# Patient Record
Sex: Female | Born: 1935 | Race: White | Hispanic: No | Marital: Married | Smoking: Former smoker
Health system: Southern US, Community
[De-identification: ages and names within clinical notes are randomized; demographics above are authoritative.]

## PROBLEM LIST (undated history)

## (undated) DIAGNOSIS — I639 Cerebral infarction, unspecified: Secondary | ICD-10-CM

## (undated) DIAGNOSIS — Z923 Personal history of irradiation: Secondary | ICD-10-CM

## (undated) DIAGNOSIS — H18899 Other specified disorders of cornea, unspecified eye: Secondary | ICD-10-CM

## (undated) DIAGNOSIS — H5462 Unqualified visual loss, left eye, normal vision right eye: Secondary | ICD-10-CM

## (undated) DIAGNOSIS — J189 Pneumonia, unspecified organism: Secondary | ICD-10-CM

## (undated) DIAGNOSIS — I10 Essential (primary) hypertension: Secondary | ICD-10-CM

## (undated) DIAGNOSIS — C519 Malignant neoplasm of vulva, unspecified: Secondary | ICD-10-CM

## (undated) DIAGNOSIS — R0602 Shortness of breath: Secondary | ICD-10-CM

## (undated) DIAGNOSIS — N183 Chronic kidney disease, stage 3 unspecified: Secondary | ICD-10-CM

## (undated) DIAGNOSIS — K279 Peptic ulcer, site unspecified, unspecified as acute or chronic, without hemorrhage or perforation: Secondary | ICD-10-CM

## (undated) DIAGNOSIS — E1129 Type 2 diabetes mellitus with other diabetic kidney complication: Secondary | ICD-10-CM

## (undated) DIAGNOSIS — T8149XA Infection following a procedure, other surgical site, initial encounter: Secondary | ICD-10-CM

## (undated) DIAGNOSIS — I4891 Unspecified atrial fibrillation: Secondary | ICD-10-CM

## (undated) DIAGNOSIS — E1165 Type 2 diabetes mellitus with hyperglycemia: Secondary | ICD-10-CM

## (undated) DIAGNOSIS — C801 Malignant (primary) neoplasm, unspecified: Secondary | ICD-10-CM

## (undated) DIAGNOSIS — E669 Obesity, unspecified: Secondary | ICD-10-CM

## (undated) DIAGNOSIS — R499 Unspecified voice and resonance disorder: Secondary | ICD-10-CM

## (undated) DIAGNOSIS — E039 Hypothyroidism, unspecified: Secondary | ICD-10-CM

## (undated) DIAGNOSIS — H3530 Unspecified macular degeneration: Secondary | ICD-10-CM

## (undated) DIAGNOSIS — E785 Hyperlipidemia, unspecified: Secondary | ICD-10-CM

## (undated) DIAGNOSIS — D649 Anemia, unspecified: Secondary | ICD-10-CM

## (undated) DIAGNOSIS — K922 Gastrointestinal hemorrhage, unspecified: Secondary | ICD-10-CM

## (undated) HISTORY — DX: Obesity, unspecified: E66.9

## (undated) HISTORY — PX: CYSTOURETHROPLASTY / URETERONEOCYSTOSTOMY: SUR383

## (undated) HISTORY — DX: Hyperlipidemia, unspecified: E78.5

## (undated) HISTORY — PX: RADICAL VULVECTOMY: SHX2286

## (undated) HISTORY — PX: OTHER SURGICAL HISTORY: SHX169

## (undated) HISTORY — DX: Anemia, unspecified: D64.9

## (undated) HISTORY — DX: Personal history of irradiation: Z92.3

## (undated) HISTORY — DX: Hypothyroidism, unspecified: E03.9

## (undated) HISTORY — DX: Malignant neoplasm of vulva, unspecified: C51.9

## (undated) HISTORY — PX: DILATION AND CURETTAGE OF UTERUS: SHX78

## (undated) HISTORY — DX: Cerebral infarction, unspecified: I63.9

## (undated) HISTORY — DX: Essential (primary) hypertension: I10

## (undated) HISTORY — PX: BREAST SURGERY: SHX581

---

## 1998-05-20 ENCOUNTER — Other Ambulatory Visit: Admission: RE | Admit: 1998-05-20 | Discharge: 1998-05-20 | Payer: Self-pay | Admitting: Endocrinology

## 1999-04-26 ENCOUNTER — Other Ambulatory Visit: Admission: RE | Admit: 1999-04-26 | Discharge: 1999-04-26 | Payer: Self-pay | Admitting: Obstetrics and Gynecology

## 1999-10-21 ENCOUNTER — Emergency Department (HOSPITAL_COMMUNITY): Admission: EM | Admit: 1999-10-21 | Discharge: 1999-10-22 | Payer: Self-pay | Admitting: Emergency Medicine

## 1999-11-01 ENCOUNTER — Ambulatory Visit (HOSPITAL_COMMUNITY): Admission: RE | Admit: 1999-11-01 | Discharge: 1999-11-01 | Payer: Self-pay | Admitting: Endocrinology

## 1999-11-01 ENCOUNTER — Encounter: Payer: Self-pay | Admitting: Endocrinology

## 1999-11-14 ENCOUNTER — Ambulatory Visit (HOSPITAL_COMMUNITY): Admission: RE | Admit: 1999-11-14 | Discharge: 1999-11-14 | Payer: Self-pay | Admitting: Endocrinology

## 1999-11-14 ENCOUNTER — Encounter: Payer: Self-pay | Admitting: Endocrinology

## 2000-05-25 DIAGNOSIS — I639 Cerebral infarction, unspecified: Secondary | ICD-10-CM

## 2000-05-25 HISTORY — DX: Cerebral infarction, unspecified: I63.9

## 2000-06-20 ENCOUNTER — Ambulatory Visit (HOSPITAL_COMMUNITY): Admission: RE | Admit: 2000-06-20 | Discharge: 2000-06-20 | Payer: Self-pay | Admitting: Endocrinology

## 2000-06-20 ENCOUNTER — Encounter: Payer: Self-pay | Admitting: Endocrinology

## 2000-08-08 ENCOUNTER — Encounter: Payer: Self-pay | Admitting: Vascular Surgery

## 2000-08-09 ENCOUNTER — Inpatient Hospital Stay: Admission: RE | Admit: 2000-08-09 | Discharge: 2000-08-10 | Payer: Self-pay | Admitting: Vascular Surgery

## 2001-03-07 ENCOUNTER — Emergency Department (HOSPITAL_COMMUNITY): Admission: EM | Admit: 2001-03-07 | Discharge: 2001-03-08 | Payer: Self-pay | Admitting: Emergency Medicine

## 2001-03-08 ENCOUNTER — Encounter: Payer: Self-pay | Admitting: Emergency Medicine

## 2003-06-16 ENCOUNTER — Encounter: Admission: RE | Admit: 2003-06-16 | Discharge: 2003-06-16 | Payer: Self-pay | Admitting: Endocrinology

## 2003-06-16 ENCOUNTER — Encounter: Payer: Self-pay | Admitting: Endocrinology

## 2004-03-25 ENCOUNTER — Emergency Department (HOSPITAL_COMMUNITY): Admission: EM | Admit: 2004-03-25 | Discharge: 2004-03-25 | Payer: Self-pay | Admitting: Emergency Medicine

## 2004-04-16 ENCOUNTER — Emergency Department (HOSPITAL_COMMUNITY): Admission: EM | Admit: 2004-04-16 | Discharge: 2004-04-16 | Payer: Self-pay | Admitting: Emergency Medicine

## 2005-12-25 DIAGNOSIS — C801 Malignant (primary) neoplasm, unspecified: Secondary | ICD-10-CM

## 2005-12-25 HISTORY — DX: Malignant (primary) neoplasm, unspecified: C80.1

## 2005-12-25 HISTORY — PX: LYMPHADENECTOMY: SHX15

## 2006-06-01 ENCOUNTER — Other Ambulatory Visit: Admission: RE | Admit: 2006-06-01 | Discharge: 2006-06-01 | Payer: Self-pay | Admitting: Obstetrics and Gynecology

## 2006-08-06 ENCOUNTER — Encounter: Admission: RE | Admit: 2006-08-06 | Discharge: 2006-08-06 | Payer: Self-pay | Admitting: Obstetrics and Gynecology

## 2006-08-07 ENCOUNTER — Encounter (INDEPENDENT_AMBULATORY_CARE_PROVIDER_SITE_OTHER): Payer: Self-pay | Admitting: *Deleted

## 2006-08-07 ENCOUNTER — Ambulatory Visit (HOSPITAL_BASED_OUTPATIENT_CLINIC_OR_DEPARTMENT_OTHER): Admission: RE | Admit: 2006-08-07 | Discharge: 2006-08-07 | Payer: Self-pay | Admitting: Obstetrics and Gynecology

## 2006-08-14 ENCOUNTER — Ambulatory Visit: Admission: RE | Admit: 2006-08-14 | Discharge: 2006-08-14 | Payer: Self-pay | Admitting: Gynecology

## 2006-09-18 ENCOUNTER — Encounter (INDEPENDENT_AMBULATORY_CARE_PROVIDER_SITE_OTHER): Payer: Self-pay | Admitting: *Deleted

## 2006-09-18 ENCOUNTER — Ambulatory Visit (HOSPITAL_COMMUNITY): Admission: RE | Admit: 2006-09-18 | Discharge: 2006-09-19 | Payer: Self-pay | Admitting: Gynecology

## 2006-10-16 ENCOUNTER — Ambulatory Visit: Admission: RE | Admit: 2006-10-16 | Discharge: 2006-10-16 | Payer: Self-pay | Admitting: Gynecology

## 2006-11-13 ENCOUNTER — Encounter (INDEPENDENT_AMBULATORY_CARE_PROVIDER_SITE_OTHER): Payer: Self-pay | Admitting: *Deleted

## 2006-11-13 ENCOUNTER — Ambulatory Visit (HOSPITAL_COMMUNITY): Admission: RE | Admit: 2006-11-13 | Discharge: 2006-11-13 | Payer: Self-pay | Admitting: Gynecology

## 2007-02-22 ENCOUNTER — Ambulatory Visit: Admission: RE | Admit: 2007-02-22 | Discharge: 2007-02-22 | Payer: Self-pay | Admitting: Gynecology

## 2007-05-14 ENCOUNTER — Ambulatory Visit: Admission: RE | Admit: 2007-05-14 | Discharge: 2007-05-14 | Payer: Self-pay | Admitting: Gynecology

## 2007-06-18 ENCOUNTER — Ambulatory Visit: Payer: Self-pay | Admitting: Vascular Surgery

## 2007-11-05 ENCOUNTER — Ambulatory Visit: Admission: RE | Admit: 2007-11-05 | Discharge: 2007-11-05 | Payer: Self-pay | Admitting: Gynecology

## 2007-12-10 ENCOUNTER — Ambulatory Visit: Admission: RE | Admit: 2007-12-10 | Discharge: 2007-12-10 | Payer: Self-pay | Admitting: Gynecology

## 2008-01-07 ENCOUNTER — Encounter: Admission: RE | Admit: 2008-01-07 | Discharge: 2008-01-07 | Payer: Self-pay | Admitting: Endocrinology

## 2008-01-13 ENCOUNTER — Encounter: Admission: RE | Admit: 2008-01-13 | Discharge: 2008-01-13 | Payer: Self-pay | Admitting: Endocrinology

## 2008-02-21 ENCOUNTER — Ambulatory Visit: Admission: RE | Admit: 2008-02-21 | Discharge: 2008-02-21 | Payer: Self-pay | Admitting: Gynecology

## 2008-05-15 ENCOUNTER — Ambulatory Visit: Admission: RE | Admit: 2008-05-15 | Discharge: 2008-05-15 | Payer: Self-pay | Admitting: Gynecology

## 2008-06-16 ENCOUNTER — Ambulatory Visit: Payer: Self-pay | Admitting: Vascular Surgery

## 2008-09-09 ENCOUNTER — Encounter: Payer: Self-pay | Admitting: Gynecology

## 2008-09-09 ENCOUNTER — Ambulatory Visit: Admission: RE | Admit: 2008-09-09 | Discharge: 2008-09-09 | Payer: Self-pay | Admitting: Gynecology

## 2008-10-13 ENCOUNTER — Ambulatory Visit (HOSPITAL_COMMUNITY): Admission: RE | Admit: 2008-10-13 | Discharge: 2008-10-14 | Payer: Self-pay | Admitting: Gynecology

## 2008-10-13 ENCOUNTER — Encounter: Payer: Self-pay | Admitting: Gynecology

## 2008-10-23 ENCOUNTER — Ambulatory Visit: Admission: RE | Admit: 2008-10-23 | Discharge: 2008-10-23 | Payer: Self-pay | Admitting: Gynecology

## 2008-11-06 ENCOUNTER — Ambulatory Visit: Admission: RE | Admit: 2008-11-06 | Discharge: 2008-11-06 | Payer: Self-pay | Admitting: Gynecology

## 2008-11-27 ENCOUNTER — Ambulatory Visit: Admission: RE | Admit: 2008-11-27 | Discharge: 2008-11-27 | Payer: Self-pay | Admitting: Gynecology

## 2009-02-10 ENCOUNTER — Ambulatory Visit: Admission: RE | Admit: 2009-02-10 | Discharge: 2009-02-10 | Payer: Self-pay | Admitting: Gynecology

## 2009-02-20 ENCOUNTER — Emergency Department (HOSPITAL_COMMUNITY): Admission: EM | Admit: 2009-02-20 | Discharge: 2009-02-20 | Payer: Self-pay | Admitting: Emergency Medicine

## 2009-05-21 ENCOUNTER — Ambulatory Visit: Admission: RE | Admit: 2009-05-21 | Discharge: 2009-05-21 | Payer: Self-pay | Admitting: Gynecology

## 2009-08-11 ENCOUNTER — Ambulatory Visit: Admission: RE | Admit: 2009-08-11 | Discharge: 2009-08-11 | Payer: Self-pay | Admitting: Gynecology

## 2009-11-17 ENCOUNTER — Ambulatory Visit: Admission: RE | Admit: 2009-11-17 | Discharge: 2009-11-17 | Payer: Self-pay | Admitting: Gynecology

## 2010-03-18 ENCOUNTER — Ambulatory Visit: Admission: RE | Admit: 2010-03-18 | Discharge: 2010-03-18 | Payer: Self-pay | Admitting: Gynecology

## 2010-07-22 ENCOUNTER — Ambulatory Visit: Admission: RE | Admit: 2010-07-22 | Discharge: 2010-07-22 | Payer: Self-pay | Admitting: Gynecology

## 2010-09-07 ENCOUNTER — Encounter: Admission: RE | Admit: 2010-09-07 | Discharge: 2010-09-07 | Payer: Self-pay | Admitting: Endocrinology

## 2010-12-25 HISTORY — PX: VULVAR LESION REMOVAL: SHX5391

## 2011-01-06 ENCOUNTER — Ambulatory Visit
Admission: RE | Admit: 2011-01-06 | Discharge: 2011-01-06 | Payer: Self-pay | Source: Home / Self Care | Attending: Gynecology | Admitting: Gynecology

## 2011-01-25 ENCOUNTER — Ambulatory Visit: Payer: Medicare Other | Attending: Gynecology | Admitting: Gynecology

## 2011-01-25 DIAGNOSIS — C519 Malignant neoplasm of vulva, unspecified: Secondary | ICD-10-CM | POA: Insufficient documentation

## 2011-01-25 DIAGNOSIS — E785 Hyperlipidemia, unspecified: Secondary | ICD-10-CM | POA: Insufficient documentation

## 2011-01-25 DIAGNOSIS — E119 Type 2 diabetes mellitus without complications: Secondary | ICD-10-CM | POA: Insufficient documentation

## 2011-01-25 DIAGNOSIS — Z09 Encounter for follow-up examination after completed treatment for conditions other than malignant neoplasm: Secondary | ICD-10-CM | POA: Insufficient documentation

## 2011-01-25 DIAGNOSIS — E039 Hypothyroidism, unspecified: Secondary | ICD-10-CM | POA: Insufficient documentation

## 2011-01-25 DIAGNOSIS — I10 Essential (primary) hypertension: Secondary | ICD-10-CM | POA: Insufficient documentation

## 2011-01-26 ENCOUNTER — Telehealth: Payer: Self-pay | Admitting: *Deleted

## 2011-01-26 NOTE — Consult Note (Signed)
  NAMECAYLA, [name] NO.:  [id number]  MEDICAL RECORD NO.:  [id number]          PATIENT TYPE:  OUT  LOCATION:  GYN                          FACILITY:  [facility]  PHYSICIAN:  De Blanch, M.D.DATE OF BIRTH:  05-10-1926  DATE OF CONSULTATION:  01/25/2011 DATE OF DISCHARGE:                                CONSULTATION   CHIEF COMPLAINT:  Vulvar cancer.  INTERVAL HISTORY:  The patient returns today for scheduled followup. Since her last visit, she has done well.  She denies any GI or GU symptoms; has no pelvic pain, pressure, vaginal bleeding or discharge. She has rare episodes of pruritus of the vulva which she uses Temovate for.  She is voiding well without any problems.  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  She underwent a modified radical right vulvectomy and right inguinal lymphadenectomy.  Lymph nodes and surgical margins were negative.  In October 2008, she developed recurrence on the contralateral vulva and underwent a left modified radical vulvectomy.  This lesion was 2.5 cm in diameter and was just lateral to urethra.  Given her age and comorbidities, we did not do an inguinal lymphadenectomy at that time. She developed yet another recurrence in October 2009 on the left vulva and after excision had reconstruction using rhomboid flaps.  Surgical margins were again negative.  We have followed the patient since October 2009 with no evidence of recurrent disease.  PAST MEDICAL HISTORY/MEDICAL ILLNESSES:  Diabetes, hypertension, hyperlipidemia, hypothyroidism.  PAST SURGICAL HISTORY:  Breast lumpectomy, cystourethropexy, D and C. Modified radical vulvectomy in 2007, 2008 and 2009.  Right inguinal femoral lymphadenectomy in 2007.  CURRENT MEDICATIONS:  Lipitor, Lotrel, Synthroid, Actos, NovoLog b.i.d., and baby aspirin.  DRUG ALLERGIES:   None.  FAMILY HISTORY:  Negative for gynecologic, breast, or colon cancer.  SOCIAL HISTORY:  The patient is married.  Her husband is Paediatric nurse, working 3 days a week.  He recently had an inguinal hernia repair.  She does not smoke.  REVIEW OF SYSTEMS:  Ten-point comprehensive review of systems negative except as noted above.  PHYSICAL EXAMINATION:  VITAL SIGNS:  Weight 156 pounds, blood pressure 124/56. GENERAL:  In general, the patient is a healthy, elderly white female, in no acute distress. HEENT:  Negative. NECK:  Supple without thyromegaly.  There is no supraclavicular or inguinal adenopathy. The vulvar exam shows reconstruction with rhomboid flaps causing some deformity anteriorly.  Otherwise, however, the vulvar skin appears healthy.  No lesions are noted.  The urethra is visualized.  IMPRESSION AND RECOMMENDATIONS:  Vulvar carcinoma with no evidence of recurrent disease.  The patient will return to see me in 6 months for continuing followup.     De Blanch, M.D.  cc Telford Nab, RN     DC/MEDQ  D:  01/25/2011  T:  01/25/2011  Job:  [id number]   Electronically Signed by De Blanch M.D. on 01/26/2011 01:22:42 PM

## 2011-04-11 LAB — GLUCOSE, CAPILLARY

## 2011-05-09 NOTE — Consult Note (Signed)
NAMEQUINTESSA, SIMMERMAN NO.:  [id number]   MEDICAL RECORD NO.:  [id number]          PATIENT TYPE:  OUT   LOCATION:  GYN                          FACILITY:  [facility]   PHYSICIAN:  De Blanch, M.D.DATE OF BIRTH:  November 13, 1926   DATE OF CONSULTATION:  08/11/2009  DATE OF DISCHARGE:                                 CONSULTATION   CHIEF COMPLAINT:  Vulvar cancer.   Perianal pruritus.   INTERVAL HISTORY:  The patient returns today for continuing followup of  her vulvar cancer.  Since her last visit, she has done well.  She denies  any vulvar symptoms, although she did have some pruritus around her anus  which is now resolved.  During that time, she had some diarrhea.  Otherwise, her functional status has been good and overall seems to be  doing well.   HISTORY OF PRESENT ILLNESS:  In September 2007, the patient had a right  vulvar carcinoma treated with modified radical vulvectomy and right  inguinal lymphadenectomy.  All nodes were negative.  Margins were  negative.  The patient was followed.   She then developed a recurrence in October 2008 with a lesion on the  left vulva.  She underwent a left modified radical vulvectomy.  The  lesion was 2.5 cm in diameter on the anterior vulva just lateral to the  urethra.  Because of her age and overall comorbidities, an inguinal  lymphadenectomy was not performed.  She had negative surgical margins  but a close margin to the urethra.  In October 2009, she had another  recurrence of the left vulva, and reconstruction was accomplished by a  rhomboid advancement flap.  Surgical margins were again negative.   PAST MEDICAL HISTORY:  Medical illnesses:  Diabetes, hypertension,  hyperlipidemia, hypothyroidism and moderate obesity.   PAST SURGICAL HISTORY:  Breast lumpectomy, cystourethropexy, D and C,  modified radical vulvectomy 2007, modified left radical vulvectomy 2008,  right inguinal femoral lymphadenectomy 2007.   CURRENT MEDICATIONS:  Lipitor, Lotrel, Synthroid, Actos, NovoLog b.i.d.  and baby aspirin.   DRUG ALLERGIES:  None.   FAMILY HISTORY:  Negative for gynecologic, breast, or colon cancer.   SOCIAL HISTORY:  The patient is married.  She is retired.  She comes  accompanied by her husband who recently had aneurysm surgery.  She does  not smoke.   REVIEW OF SYSTEMS:  A 10-point comprehensive review of systems negative  except as noted above.   PHYSICAL EXAMINATION:  VITAL SIGNS:  Weight 150 pounds, blood pressure  128/62.  GENERAL:  The patient is a healthy elderly white female in no acute  distress.  HEENT:  Negative.  NECK:  Supple without thyromegaly.  There is no supraclavicular or  inguinal adenopathy.  ABDOMEN:  Soft and nontender.  No masses, organomegaly, ascites or  hernias are noted.  PELVIC EXAM:  EG/BUS, vagina, bladder urethra show reconstructive  changes.  Careful inspection showed no lesions.  Examination of the anus  shows no lesions, either.  Vagina is otherwise normal.  No lesions are  noted.   IMPRESSION:  History of  vulvar carcinoma with recurrences, currently  without any evidence of disease.  The patient will return in 3 months  for continuing followup.      De Blanch, M.D.  Electronically Signed     DC/MEDQ  D:  08/11/2009  T:  08/11/2009  Job:  [id number]   cc:   Telford Nab, R.N.  [address]  [place], [place] [address]   Dollene Cleveland, M.D.  Fax: [phone]

## 2011-05-09 NOTE — Consult Note (Signed)
NAMEPRAISE, STENNETT NO.:  [id number]   MEDICAL RECORD NO.:  [id number]          PATIENT TYPE:  OUT   LOCATION:  GYN                          FACILITY:  [facility]   PHYSICIAN:  De Blanch, M.D.DATE OF BIRTH:  05-07-1926   DATE OF CONSULTATION:  12/10/2007  DATE OF DISCHARGE:                                 CONSULTATION   [facility]   REFERRING PHYSICIAN:  __________   CHIEF COMPLAINT:  Postoperative followup.   INTERVAL HISTORY:  The patient underwent an anterior radical vulvectomy  at [facility] on November 19, 2007.  Final pathology showed a  moderately differentiated invasive squamous cell carcinoma invading 1  cm.  There was no evidence of lymph vascular space involvement, and all  margins were free of resection although one margin close to the urethra  was within 2 mm.  The patient has had an uncomplicated postoperative  course.   PHYSICAL EXAM:  VITAL SIGNS:  Weight 154 pounds.  GENERAL:  The patient is a healthy white female in no acute distress.  HEENT:  Negative.  NECK:  Supple without thyromegaly.  ABDOMEN:  Soft, nontender.  No mass, organomegaly, ascites, or hernias  noted.  There is no inguinal adenopathy.  PELVIC EXAM:  EG BUS shows that the anterior vulva is healing well.   IMPRESSION:  Recurrent vulvar cancer status post radical resection.   I have reviewed the patient's pathology report with the patient and her  husband, and we discussed the pros-and-cons of undertaking a diagnostic  left lymphadenectomy (the patient previously had a right inguinal  lymphadenectomy).  At this juncture she does not wish to consider this,  and we will monitor her closely.  She will return to see me in 3 months  for continuing followup.      De Blanch, M.D.  Electronically Signed     DC/MEDQ  D:  12/10/2007  T:  12/11/2007  Job:  [id number]   cc:   Telford Nab, R.N.  [address]  [place], [place] [address]   Rande Brunt. Eda Paschal, M.D.  Fax: [phone]   Dollene Cleveland, M.D.  Fax: [phone]

## 2011-05-09 NOTE — Op Note (Signed)
[name], [name] NO.:  [id number]   MEDICAL RECORD NO.:  [id number]          PATIENT TYPE:  OIB   LOCATION:                               FACILITY:  [facility]   PHYSICIAN:  De Blanch, M.D.DATE OF BIRTH:  Jul 22, 1926   DATE OF PROCEDURE:  10/12/2008  DATE OF DISCHARGE:                               OPERATIVE REPORT   PREOPERATIVE DIAGNOSIS:  Recurrent carcinoma of the left vulva.   POSTOPERATIVE DIAGNOSIS:  Recurrent carcinoma of the left vulva.   PROCEDURE:  Modified left radical vulvectomy.  Rhomboid advancement flap  reconstruction.   FIRST ASSISTANT:  Telford Nab, R.N.   ANESTHESIA:  General with LMA.   ESTIMATED BLOOD LOSS:  50 mL.   FINDINGS:  Examination under anesthesia revealed the patient's vulva  which had previously undergone radical vulvectomy.  There were two  lesions, both of which were approximately 2 cm in diameter arising from  the left anterior vulva approaching the midline on the most superior  lesion.  Essentially the entire left vulva was excised along with the  clitoris in order to obtain a surgical margin.  The margin extended  adjacent to the urethral meatus on the left.  There were no palpable  inguinal nodes.   DESCRIPTION OF PROCEDURE:  The patient was brought to the operating room  and after satisfactory attainment of LMA anesthesia was placed in a  modified lithotomy position in [place] stirrups.  The vulva, vagina,  perineum and anterior abdominal wall were prepped with Betadine and the  patient was draped.  The lesions were identified and a 1-1/2 cm margin  was created in an elliptical incision which extended above the clitoris  and down to the lower third of the posterior vulva immediately adjacent  to the urethral meatus and nearly to the labial crural fold laterally.  This was taken down to the pelvic floor fascia.  Hemostasis achieved  with cautery.  The bleeding from the base the clitoris was controlled  with two figure-of-eight sutures of 2-0 Vicryl.  Once the specimen was  excised, it was oriented on cork board.   The hemostasis achieved with cautery and then closure of the vulva was  begun.  The superior aspect reapproximated adequately for approximately  1 inch and the area adjacent to the right side of the urethra  reapproximated for approximately 2 cm as well.  In order to close the  defect two rhomboid flaps were created lateral to the lateral aspect of  the incision.  These were undermined with approximately 1/2 cm of fat  beneath the skin flap.  There were then mobilized medially.  On tip of a  flap approximated the lateral aspect of the urethral meatus from above  and below.  All sutures used were 2-0 Vicryl.  There was an area that  was left open approximately 6 mm in diameter just above the urethra  which would have to granulate in.  The remainder of the vulva closed  adequately with interrupted vertical mattress sutures and interrupted  sutures.  In some areas deep closure of the  subcutaneous area was  created using 2-0 Vicryl.  At the completion of the surgical procedure a  Foley catheter was placed and a ice pack was placed on the vulva.   The patient tolerated the surgery well and was transferred to the  recovery room in satisfactory condition  with the plan of holding her  overnight to make certain she could void the next morning.  Sponge,  needle and sponge counts correct x2.      De Blanch, M.D.  Electronically Signed     DC/MEDQ  D:  10/13/2008  T:  10/14/2008  Job:  [id number]   cc:   Telford Nab, R.N.  [address]  [place], [place] [address]   Rande Brunt. Eda Paschal, M.D.  Fax: [phone]

## 2011-05-09 NOTE — Consult Note (Signed)
NAMEBURGANDY, HACKWORTH NO.:  [id number]   MEDICAL RECORD NO.:  [id number]          PATIENT TYPE:  OUT   LOCATION:  GYN                          [facility]ACILITY:  [facility]   PHYSICIAN:  De Blanch, M.D.DATE OF BIRTH:  November 02, 1926   DATE OF CONSULTATION:  05/14/2007  DATE OF DISCHARGE:                                 CONSULTATION   GYN ONCOLOGY CLINIC NOTE   CHIEF COMPLAINT:  Carcinoma of the vulva.   INTERVAL HISTORY:  Since her last visit, the patient has done well.  She  denies any GI or GU symptoms, has no pelvic pain, pressure, vaginal  bleeding, or discharge.  Her vulvar pruritus is minimal.  She denies any  edema of her leg.   HISTORY OF PRESENT ILLNESS:  The patient underwent a modified radical  vulvectomy of the right vulva in September of 2007.  She had an area of  invasive vulvar carcinoma and subsequently underwent right inguinal  lymphadenectomy in November of 2007.  All lymph nodes were negative for  metastatic disease, and all surgical margins were negative.  The patient  has been followed since that time, and no evidence of recurrent disease.   PAST MEDICAL HISTORY:  Medical illnesses:  1. Diabetes.  2. Hypertension.  3. Hyperlipidemia.  4. Hypothyroidism.   PAST SURGICAL HISTORY:  1. Breast lumpectomy.  2. Cystourethropexy.  3. D&C.  4. Modified radical vulvectomy.  5. Right inguinal femoral lymphadenectomy.   CURRENT MEDICATIONS:  Lipitor, Lotrel, Synthroid, Actos, NovoLog b.i.d.,  and baby aspirin.   DRUG ALLERGIES:  None.   FAMILY HISTORY:  Negative for gynecologic, breast, or colon cancer.   SOCIAL HISTORY:  The patient is married and is retired.  She is a former  smoker.   REVIEW OF SYSTEMS:  A 10-point comprehensive review of systems negative  except as noted above.   PHYSICAL EXAMINATION:  VITAL SIGNS:  Weight 153 pounds, blood pressure  145/65.  GENERAL:  The patient is a healthy white female in no acute distress.  HEENT:  Negative.  NECK:  Supple without thyromegaly.  There is no supraclavicular or  inguinal adenopathy.  ABDOMEN:  Soft and nontender.  No masses, organomegaly, ascites, or  hernias are noted.  Palpation in the inguinal regions bilaterally show no adenopathy.  Her  right inguinal incision is well healed.  She has no lymphedema.  PELVIC EXAM:  EG-BUS is status post vulvectomy.  There are some changes  consistent with lichen sclerosis in the anterior vulva.  The vagina is  otherwise clean and well-supported, no lesions are noted, and the cervix  is atrophic, the uterus is small and anterior, no adnexal masses are  noted.  Rectovaginal exam confirms.   IMPRESSION:  1. Vulvar cancer, September of 2007; no evidence of recurrent disease.  2. Lichen sclerosis with pruritus.  The patient will continue using      Temovate ointment 0.05% b.i.d.  She will return to see me in six      months for continuing followup.      De Blanch, M.D.  Electronically Signed  DC/MEDQ  D:  05/14/2007  T:  05/14/2007  Job:  [id number]   cc:   Telford Nab, R.N.  [address]  [place], [place] [address]   Rande Brunt. Eda Paschal, M.D.  Fax: [phone]   Dollene Cleveland, M.D.  Fax: [phone]

## 2011-05-09 NOTE — Assessment & Plan Note (Signed)
OFFICE VISIT   Lindsay, [name]  DOB:  09-12-1926                                       06/18/2007  ZOXWR#:[id number]   [name] returns for her annual followup regarding her  carotid  occlusive disease. She is status post right carotid endarterectomy in  August of 2001 following a right brain stroke. She has had no new  neurologic symptoms. She continues to have some mild left facial  weakness, her left sided weakness has completely resolved. She has had  no amaurosis fugax or other neurologic symptoms in the interim. She also  denies chest pain, dyspnea on exertion, PND, or orthopnea. She is able  to ambulate about 1 block. She takes 1 aspirin per day.   PHYSICAL EXAMINATION:  Blood pressure 152/77, heart rate is 70,  respirations are 16, and carotid pulses are 3+ with soft bruits on the  left. NEUROLOGIC EXAM: Reveals mild left facial weakness, otherwise  unremarkable. Speech is clear. CHEST: Clear to auscultation. Her abdomen  is soft, nontender with no masses. She has 3+ femoral and 2+ posterior  tibial pulses palpable bilaterally.   Carotid Duplex exam today has progressed slightly from last year with a  70% left internal carotid stenosis and no evidence of flow reduction on  the right side at the endarterectomy site.   I reassured her regarding these findings. She will return in 1 year for  continued follow up, unless she develops any new symptoms in the  interim.   Quita Skye Hart Rochester, M.D.  Electronically Signed   JDL/MEDQ  D:  06/18/2007  T:  06/19/2007  Job:  [id number]

## 2011-05-09 NOTE — Consult Note (Signed)
[name], [name] NO.:  [id number]   MEDICAL RECORD NO.:  [id number]          PATIENT TYPE:  OUT   LOCATION:  GYN                          FACILITY:  [facility]   PHYSICIAN:  De Blanch, M.D.DATE OF BIRTH:  [birth year]   DATE OF CONSULTATION:  11/06/2008  DATE OF DISCHARGE:                                 CONSULTATION   CHIEF COMPLAINT:  Postoperative follow up of radical vulvectomy.   INTERVAL HISTORY:  The patient returns today for continuing follow up.  She had been taking Levaquin, using sitz baths and applying Silvadene  cream to her vulva since I saw her on October 30.  She reports no fever  or chills is ambulatory, although she feels fairly tired.  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   PHYSICAL EXAMINATION:  Weight 150 pounds, blood pressure 168/88.  PELVIC EXAM:  EG BUS shows significantly decreased erythema.  The  incisions are healing quite well.  The lateral incision is nearly  completely reapproximated.  In addition, the area above the urethra  which was granulating is also healing nicely.   IMPRESSION:  Overall, the patient is doing quite well with recovery.  Because of the continued erythema we will continue Levaquin for 10 more  days.  The patient returns to see me in 2 weeks.  She will continue her  regimen with sitz baths and Silvadene as well.      De Blanch, M.D.  Electronically Signed     DC/MEDQ  D:  11/06/2008  T:  11/06/2008  Job:  [id number]   cc:   Telford Nab, R.N.  [address]  [place], [place] [address]

## 2011-05-09 NOTE — Consult Note (Signed)
NAMEMEEKA, CARTELLI NO.:  [id number]   MEDICAL RECORD NO.:  [id number]          PATIENT TYPE:  OUT   LOCATION:  GYN                          FACILITY:  [facility]   PHYSICIAN:  De Blanch, M.D.DATE OF BIRTH:  [birth year]   DATE OF CONSULTATION:  11/27/2008  DATE OF DISCHARGE:                                 CONSULTATION   CHIEF COMPLAINT:  Postoperative followup.   INTERVAL HISTORY:  The patient underwent a radical vulvectomy with  advancement flap for reconstruction on October 13, 2008.  She initially  had some postoperative cellulitis and slight separation of some of her  flaps.  She is continued on sitz baths, Levaquin, and Silvadene cream.  She reports she feels much better and has minimal discharge.   PHYSICAL EXAM:  Weight 150 pounds.  PELVIC EXAM:  EG/BUS shows the radical vulvectomy sites and advancement  flaps are nearly completely healed.  There is slight 1-cm area of  granulation to the left of the urethra at approximately 1 o'clock.  Otherwise, she is healing well and there is no evidence of erythema  today.   IMPRESSION:  Recurrent vulvar cancer status post modified radical  vulvectomy.  Patient is doing well.  She will continue to use Silvadene  cream on the one area of granulation tissue.  We will have her return to  see me in 2 months for continuing followup and evaluation.      De Blanch, M.D.  Electronically Signed     DC/MEDQ  D:  11/27/2008  T:  11/27/2008  Job:  [id number]   cc:   Telford Nab, R.N.  [address]  [place], [place] [address]   [name], M.D.  Fax: [phone]

## 2011-05-09 NOTE — Consult Note (Signed)
[name], [name] NO.:  [id number]   MEDICAL RECORD NO.:  [id number]          PATIENT TYPE:  OUT   LOCATION:  GYN                          [facility]ACILITY[facility]   PHYSICIAN:  De Blanch, M.D.DATE OF BIRTH:  05-Jan-1926   DATE OF CONSULTATION:  11/05/2007  DATE OF DISCHARGE:                                 CONSULTATION   CHIEF COMPLAINT:  Followup of vulvar cancer.   INTERVAL HISTORY:  Since her last visit the patient has done well.  She  specifically denies any GI or GU symptoms.  She has had no pelvic pain,  pressure, vaginal bleeding or discharge.  She does note that she  continues to have some problems associated with her lichen sclerosis  with itching although this is much better when she uses Temovate.  She  denies any vulvar bleeding or masses.   HISTORY OF PRESENT ILLNESS:  The patient underwent a modified radical  vulvectomy of the right vulva September 2007.  She subsequently had a  right inguinal lymphadenectomy November 2007.  All lymph nodes were  negative for metastatic disease.   PAST MEDICAL HISTORY:  1. Diabetes.  2. Hypertension.  3. Hypothyroidism.   PAST SURGICAL HISTORY:  1. Breast lumpectomy.  2. Cystourethropexy.  3. D&C.  4. Modified radical vulvectomy.  5. Right inguinal femoral lymphadenectomy.   CURRENT MEDICATIONS:  Lipitor or Lotrel, Synthroid, Actos, NovoLog  b.i.d., and baby aspirin.   DRUG ALLERGIES:  NONE.   FAMILY HISTORY:  Negative for gynecologic, breast, or colon cancer.   SOCIAL HISTORY:  The patient is married and she comes accompanied by her  husband.  She is a former smoker.   REVIEW OF SYSTEMS:  Ten-point coverage review of systems negative as  noted above.   PHYSICAL EXAMINATION:  VITAL SIGNS:  Weight 102 pounds, blood pressure  152/84.  GENERAL:  The patient is a healthy elderly white female in no acute  distress.  HEENT:  Negative.  NECK:  Supple without thyromegaly.  There is no  supraclavicular or  inguinal adenopathy.  ABDOMEN:  Soft, nontender.  No mass, megaly, ascites or hernias noted.  PELVIC:  EG/BUS shows extensive changes associated with lichen  sclerosis.  However, in addition there is a 1.5 cm ulcerative lesion in  the midline of the anterior vulva in the region of a former location of  the clitoris.  This extends very close to the urethra.  It is nodular  and exquisitely tender to palpation and bleeds easily on contact.   We attempted to biopsy this lesion suspicious that it was a cancer.  However, the patient did not tolerate the local injection of 1%  Xylocaine and, therefore, we will schedule her to undergo a wide  excision of this lesion under anesthesia and indicated to the patient  and her husband that it is most likely she does have a new malignancy.  We will try to coordinate surgery as soon as is feasible.      De Blanch, M.D.  Electronically Signed     DC/MEDQ  D:  11/05/2007  T:  11/06/2007  Job:  [id number]   cc:   Telford Nab, R.N.  [address]  [place], [place] [address]   Rande Brunt. Eda Paschal, M.D.  Fax: [phone]   Dollene Cleveland, M.D.  Fax: [phone]

## 2011-05-09 NOTE — Consult Note (Signed)
NAMEKARITA, DRALLE NO.:  [id number]   MEDICAL RECORD NO.:  [id number]          PATIENT TYPE:  OUT   LOCATION:  GYN                          FACILITY:  [facility]   PHYSICIAN:  De Blanch, M.D.DATE OF BIRTH:  1926-04-17   DATE OF CONSULTATION:  DATE OF DISCHARGE:                                 CONSULTATION   CHIEF COMPLAINT:  Vulvar cancer postoperative follow-up.   INTERVAL HISTORY:  The patient underwent a modified radical vulvectomy  and two rhomboid advancement flaps on October 20.  She has a number of  medical problems including diabetes.  She reports that she has done  reasonably well at home although she is somewhat sore.  She had been  using Percocet p.r.n.   She denies any fever or chills.   Pathology report is reviewed and two 1.5 cm lesions are identified.  One  surgical margin is very close.   PHYSICAL EXAMINATION:  VITAL SIGNS:  Weight 153 pounds.  ABDOMEN:  Soft, nontender.  PELVIC:  EG, BUS shows that the entire vulvar reconstruction is nearly  completely intact.  One suture line in the posterior lateral vulva has  separated slightly.  There is some area above the urethra which was left  open.  Midline superior vulvar incision is intact.  The vulva is  erythematous, consistent with an early cellulitis.   IMPRESSION:  The patient is having satisfactory postoperative recovery  to date.  Given the apparent cellulitis, we will start her on Levaquin  500 mg daily for 10 days.  We will also have her begin doing sitz baths  twice a day and then applying Silvadene cream.  We will have the patient  return to see me on November 13.      De Blanch, M.D.  Electronically Signed     DC/MEDQ  D:  10/23/2008  T:  10/23/2008  Job:  [id number]   cc:   Telford Nab, R.N.  [address]  [place], [place] [address]

## 2011-05-09 NOTE — Consult Note (Signed)
NAMEMARINDA, [name] NO.:  [id number]   MEDICAL RECORD NO.:  [id number]          PATIENT TYPE:  OUT   LOCATION:  GYN                          FACILITY:  [facility]   PHYSICIAN:  De Blanch, M.D.DATE OF BIRTH:  Oct 31, 1926   DATE OF CONSULTATION:  02/10/2009  DATE OF DISCHARGE:                                 CONSULTATION   CHIEF COMPLAINT:  Vulvar cancer.   INTERVAL HISTORY:  The patient returns today for continuing follow up.  At her last visit, her vulvectomy site was nearly completely healed.  She reports since then she has had complete healing.  She denies any  vaginal discharge, bleeding, pain, pruritus or any new lesions.  She  reports that her voiding function is good.  She does have some urgency,  but she in particular does not spray when she voids.   HISTORY OF PRESENT ILLNESS:  The patient initially presented with vulvar  carcinoma on the right side in September 2007.  She was treated with  modified radical vulvectomy and right inguinal lymphadenectomy.  All  nodes were negative and surgical margins were negative.   In October 2008, the patient presented with a new lesion of the left  vulva.  She underwent a left modified radical vulvectomy at [facility]  in November 2008.  The lesion was 2.5 cm in the anterior vulva just  lateral to the urethra.  Because of her age and overall comorbidities an  inguinal lymphadenectomy was not performed.  Final pathology of the  recurrence of the lesion had negative surgical margins, but the margin  close to the urethra was approximately 3 mm.   In October 2009, the patient had another recurrence on the left vulva.  Reconstruction was accomplished by a rhomboid advancement flap.  She  actually had two lesions at this time, both measuring approximately 1.5  cm.  Surgical margins were again negative.   PAST MEDICAL HISTORY:  1. Diabetes.  2. Hypertension.  3  Hyperlipidemia.  1. Hypothyroidism.  2.  Moderate obesity.   PAST SURGICAL HISTORY:  1. Breast lumpectomy.  2. Cystourethropexy.  3. D and C.  4. Modified radical right vulvectomy.  5. Modified left radical vulvectomy.  6. Right inguinal femoral lymphadenectomy.   CURRENT MEDICATIONS:  Lipitor, Lotrel, Synthroid, Actos, NovoLog b.i.d.  and baby aspirin.   DRUG ALLERGIES:  NONE.   FAMILY HISTORY:  Negative for gynecologic, breast or colon cancer.   SOCIAL HISTORY:  The patient is married.  She is retired.  She comes  accompanied by her husband today.  Sadly, her daughter died in [place] in  12/16/2023 following a battle with cancer.   PHYSICAL EXAMINATION:  VITAL SIGNS:  Weight 150 pounds, blood pressure  137/64.  GENERAL:  The patient is a healthy white female in no acute distress.  HEENT:  Negative.  NECK:  Supple without thyromegaly.  There is no supraclavicular or  inguinal adenopathy.  ABDOMEN:  Soft, nontender.  No mass, organomegaly, ascites or hernia  noted.  PELVIC:  EG/BUS shows complete healing following radical vulvectomy with  advancement rhomboid  flaps.  The urethra is well visualized.  No lesions  are noted.  The vagina is otherwise normal, but somewhat stenotic.   IMPRESSION:  Recurrent vulvar cancer, clinically free of disease.   PLAN:  The patient will return to see me in 3 months for continuing  follow up.      De Blanch, M.D.  Electronically Signed     DC/MEDQ  D:  02/10/2009  T:  02/10/2009  Job:  [id number]   cc:   Telford Nab, R.N.  [address]  [place], [place] [address]   [name], M.D.  Fax: [phone]

## 2011-05-09 NOTE — Procedures (Signed)
CAROTID DUPLEX EXAM   INDICATION:  Followup, carotid artery disease.   HISTORY:  Diabetes:  Yes.  Cardiac:  No.  Hypertension:  Yes.  Smoking:  No.  Previous Surgery:  Right CEA with DPA, 08/09/00.  CV History:  TIA in 2001 with no residual effects.  Amaurosis Fugax No, Paresthesias No, Hemiparesis No                                       RIGHT             LEFT  Brachial systolic pressure:         150               158  Brachial Doppler waveforms:         Biphasic          Biphasic  Vertebral direction of flow:        Antegrade         Antegrade  DUPLEX VELOCITIES (cm/sec)  CCA peak systolic                   65                77  ECA peak systolic                   253               133  ICA peak systolic                   92                231  ICA end diastolic                   19                47  PLAQUE MORPHOLOGY:                  None in ICA       Calcified  PLAQUE AMOUNT:                      None in ICA       Moderate  PLAQUE LOCATION:                    ECA               ICA/ECA/CCA   IMPRESSION:  1. Right internal carotid artery shows no evidence of restenosis,      status post carotid endarterectomy.  2. Left internal carotid artery shows evidence of 60-79% stenosis.  3. Right external carotid artery stenosis.  4. No significant changes from previous study.     ___________________________________________  Quita Skye Hart [place], M.D.   AS/MEDQ  D:  06/16/2008  T:  06/16/2008  Job:  [id number]

## 2011-05-09 NOTE — Consult Note (Signed)
Lindsay, [name] NO.:  [id number]   MEDICAL RECORD NO.:  [id number]          PATIENT TYPE:  OUT   LOCATION:  GYN                          FACILITY:  [facility]   PHYSICIAN:  De Blanch, M.D.DATE OF BIRTH:  1926-11-24   DATE OF CONSULTATION:  05/15/2008  DATE OF DISCHARGE:                                 CONSULTATION   CHIEF COMPLAINT:  Vulvar cancer.   INTERVAL HISTORY:  The patient returns today for continuing followup and  surveillance.  She reports that since her last visit she has done well.  She denies any new lesions on the vulva.  She has had some pruritus on  the medial aspects of her thighs, but has had no vulvar symptoms per se.  Bladder function is good.  She denies any other GI or GU symptoms.  Functional status is good.   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  She underwent a modified radical vulvectomy and right inguinal  lymphadenectomy.  All nodes and margins were negative.  The patient was  followed until October 2008, when she developed a new lesion on her left  anterior vulva.  She underwent a left modified radical vulvectomy at [facility] in November 2008.  The lesion was 2.5 cm and came close to the  urethra.  The final pathology showed negative margins, although the  margin near the urethra was 3 mm.  Right inguinal lymphadenectomy was  not performed, because of the patient's age and overall poor medical  condition.   PAST MEDICAL ILLNESS HISTORY:  1. Diabetes.  2. Hypertension.  3. Hyperlipidemia.  4. Hypothyroidism.  5. Moderate obesity.   PAST SURGICAL HISTORY:  1. Breast lumpectomy.  2. Cystourethropexy.  3. D&C.  4. Modified right radical vulvectomy.  5. Modified left radical vulvectomy.  6. Right inguinal femoral lymphadenectomy.   CURRENT MEDICATIONS:  1. Lipitor.  2. Lotrel.  3. Synthroid.  4.  Actos.  5. NovoLog twice daily.  6. Baby aspirin.   ALLERGIES:  No known drug allergies.   FAMILY HISTORY:  Negative for gynecologic, breast or colon cancer.   REVIEW OF SYSTEMS:  A 10-point comprehensive review of systems negative  except as noted above.   PHYSICAL EXAMINATION:  VITAL SIGNS:  Weight 155 pounds, blood pressure  138/68, pain score 0, functional status 1, respirations 20.  GENERAL:  The patient is a healthy white female, in no acute distress.  HEENT:  Negative.  NECK:  Supple without thyromegaly.  NODES:  There is no supraclavicular or inguinal adenopathy.  ABDOMEN:  The inguinal incision is well-healed.  The abdomen is soft,  nontender, no mass or organomegaly.  No hernia noted.  PELVIC EXAM:  EG, BUS status post bilateral radical vulvectomies.  There  is some hyperkeratosis in the midline anteriorly above the urethra.  Careful inspection of the urethra shows no lesions at or near the  urethra.  The remainder of the vulva seems normal.  She does have some  changes consistent with a yeast infection on the medial thighs.   IMPRESSION:  1. Vulvar cancer in 2007, and in 2008.  No evidence of recurrent      disease.  2. Monilial yeast infection:  The patient is instructed to use      Monistat on her medial thighs once daily.   FOLLOWUP:  She will return to see me in four months, for continuing  followup.      De Blanch, M.D.  Electronically Signed     DC/MEDQ  D:  05/15/2008  T:  05/15/2008  Job:  [id number]   cc:   Telford Nab, R.N.  [address]  [place], [place] [address]   Rande Brunt. Eda Paschal, M.D.  Fax: [phone]   Dollene Cleveland, M.D.  Fax: [phone]

## 2011-05-09 NOTE — Consult Note (Signed)
[name], [name] NO.:  [id number]   MEDICAL RECORD NO.:  [id number]          PATIENT TYPE:  OUT   LOCATION:  GYN                          FACILITY:  [facility]   PHYSICIAN:  De Blanch, M.D.DATE OF BIRTH:  02/02/1926   DATE OF CONSULTATION:  05/21/2009  DATE OF DISCHARGE:                                 CONSULTATION   CHIEF COMPLAINT:  Vulvar cancer.   INTERVAL HISTORY:  The patient returns today for continuing followup.  Since her last visit she says she has had no new vulvar or pelvic  symptoms.  She specifically denies any pain, bleeding or discharge.  She  has no GI or GU symptoms.   HISTORY OF PRESENT ILLNESS:  September of 2007 the patient presented  with a right vulvar carcinoma.  She was treated with a modified radical  vulvectomy and right inguinal lymphadenectomy.  All nodes were negative  and margins were negative.   October 2008 the patient presented with a new lesion of left vulva.  She  underwent a left modified radical vulvectomy at [facility] November  of 2008.  The lesion was 2.5 cm in the anterior vulva just lateral to  the urethra.  Because of her age and overall comorbidities an inguinal  lymphadenectomy was not performed.  Final pathology showed negative  surgical margins but did have a close margin to the urethra at 3 mm.   October of 2009 the patient had another recurrence in the left vulva,  reconstruction was accomplished by rhomboid advancement flap.  Surgical  margins were again negative.   PAST MEDICAL HISTORY/MEDICAL ILLNESSES:  1. Diabetes.  2. Hypertension.  3. Hyperlipidemia.  4. Hypothyroidism.  5. Moderate obesity.   PAST SURGICAL HISTORY:  1. Breast lumpectomy.  2. Cystourethropexy.  3. D and C.  4. Modified radical vulvectomy.  5. Modified left radical vulvectomy.  6. Right inguinal femoral lymphadenectomy.   CURRENT MEDICATIONS:  Lipitor, Lotrel, Synthroid, Actos, NovoLog b.i.d.  and baby  aspirin.   DRUG ALLERGIES:  NONE.   FAMILY HISTORY:  Negative for gynecologic, breast or colon cancers.   SOCIAL HISTORY:  The patient is married, she is retired.  She does not  smoke.   REVIEW OF SYSTEMS:  A 10-point comprehensive review of systems is  negative expect as noted above.  It is noted that her husband apparently  has 2 aneurysms and is going to undergo stent placement at the [facility] in the near future.   PHYSICAL EXAMINATION:  Weight 153 pounds, blood pressure 138/70.  GENERAL:  The patient is a healthy, elderly white female in no acute  distress.  HEENT:  Negative.  NECK:  Supple without thyromegaly, there is no supraclavicular or  inguinal adenopathy.  ABDOMEN:  Soft, nontender.  No masses, organomegaly, ascites or hernias  noted.  PELVIS:  EG/BUS there are no lesions, changes associated with radical  vulvectomy and advancement flaps are noted.  Careful inspection of the  urethra in particular shows no lesions.  The vagina is stenotic but no  lesions are noted.  Bimanual exam  revealed no masses or __________  nodularity.   IMPRESSION:  Vulvar cancer, last treated for recurrence October of 2009.  No evidence of recurrent disease.   PLAN:  Patient to return to see me in 3 months, will continue to follow.      De Blanch, M.D.  Electronically Signed     DC/MEDQ  D:  05/21/2009  T:  05/21/2009  Job:  [id number]   cc:   Telford Nab, R.N.  [address]  [place], [place] [address]   [name], M.D.  Fax: [phone]

## 2011-05-09 NOTE — Consult Note (Signed)
[name], [name] NO.:  [id number]   MEDICAL RECORD NO.:  [id number]          PATIENT TYPE:  OUT   LOCATION:  GYN                          FACILITY:  [facility]   PHYSICIAN:  De Blanch, M.D.DATE OF BIRTH:  08/13/1926   DATE OF CONSULTATION:  02/21/2008  DATE OF DISCHARGE:                                 CONSULTATION   CHIEF COMPLAINT:  Vulvar cancer.   INTERVAL HISTORY:  The patient returns today for continuing follow-up of  recurrent vulvar carcinoma.  She initially had presented with a cancer  on her right vulva in September 2007 treated with modified radical  vulvectomy and right inguinal lymphadenectomy. All lymph nodes were  negative, surgical margins were negative and the patient was followed  until October 2008 when she presented with a new lesion on the left  vulva.  She underwent a left modified radical vulvectomy at [facility] on November 19, 2007.  The lesion was approximately 2.5 cm in  the anterior left vulva just lateral to the urethra.  Because of the  patient's age and overall comorbidities, an inguinal lymphadenectomy was  not performed at that time.  Final pathology of the recurrence showed a  lesion which was invading. All surgical margins were free although only  approximately 3 mm from the urethra.  The patient had good wound healing  and has had no difficulties with her bladder postoperatively.   PAST MEDICAL HISTORY:  Medical illnesses, diabetes, hypertension,  hyperlipidemia, hypothyroidism, obesity.   PAST SURGICAL HISTORY:  Breast lumpectomy, cystourethropexy, D&C,  modified right radical vulvectomy, modified left radical vulvectomy and  right inguinal femoral lymphadenectomy.   CURRENT MEDICATIONS:  Lipitor, Lotrel, Synthroid, Actos, NovoLog b.i.d.  and baby aspirin.   DRUG ALLERGIES:  None.   FAMILY HISTORY:  Negative for gynecologic, breast or colon cancer.   SOCIAL HISTORY:  The patient is married.  She is  retired and is a former  smoker.   REVIEW OF SYSTEMS:  A 10-point comprehensive review of systems negative  except as noted above.   PHYSICAL EXAM:  Weight 152 pounds, blood pressure 116/64.  GENERAL:  The patient is a healthy white female in no acute distress.  HEENT:  Negative.  NECK:  Supple without thyromegaly. There is no supraclavicular or  inguinal adenopathy.  The right inguinal incision is well-healed.  She  has no lymphedema.  PELVIC:  EGBUS shows excellent healing. She does have some changes  consistent with lichen sclerosis especially on the left labia majora.  Careful inspection of the urethra shows no evidence of a lesion.  The  vagina is atrophic.   IMPRESSION:  Recurrent vulvar carcinoma status post radical excision, no  evidence recurrent disease.  Overall the patient is doing quite well.  We will ask her to return to see [place] in 3 months for continuing follow-up  and surveillance.      De Blanch, M.D.  Electronically Signed     DC/MEDQ  D:  02/21/2008  T:  02/22/2008  Job:  [id number]   cc:   Telford Nab, R.N.  [phone]  [address]  [place], [place] [address]   Rande Brunt. Eda Paschal, M.D.  Fax: [phone]   Dollene Cleveland, M.D.  Fax: [phone]

## 2011-05-12 NOTE — Consult Note (Signed)
NAMEADALIE, MAND NO.:  [id number]   MEDICAL RECORD NO.:  [id number]          PATIENT TYPE:  OUT   LOCATION:  GYN                          FACILITY:  [facility]   PHYSICIAN:  De Blanch, M.D.DATE OF BIRTH:  05/14/1926   DATE OF CONSULTATION:  10/16/2006  DATE OF DISCHARGE:                                   CONSULTATION   CHIEF COMPLAINT:  Vulvar cancer.   HISTORY OF PRESENT ILLNESS:  The patient returns today for continued  evaluation and treatment planning. She underwent a modified radical  vulvectomy of the right vulva on September 18, 2006. This shows a minuet  focus of residual invasive carcinoma. She also underwent wide local excision  (partial vulvectomy of a left vulvar lesion, which was found to be intra-  epithelial neoplasia. She has had no difficulties since surgery and seems to  be healing well.   PAST MEDICAL HISTORY:  Diabetes, hypertension, hyperlipidemia,  hypothyroidism.   PAST SURGICAL HISTORY:  Breast lumpectomy, cystourethropexy in 1977, D&C,  modified radical right vulvectomy.   CURRENT MEDICATIONS:  Lipitor, Lotrel, Synthroid, Actos, NovoLog 8 units  with each meal and 38 units at bedtime. Baby aspirin.   ALLERGIES:  No known drug allergies.   FAMILY HISTORY:  Negative for gynecologic, breast, or colon cancer.   SOCIAL HISTORY:  The patient is married. 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  Former smoker for 30 years. She does not smoke currently. She comes in  accompanied by her husband today. She is gravida 2.   REVIEW OF SYSTEMS:  A 10 point comprehensive review of systems is negative  except as noted above.   PHYSICAL EXAMINATION:  VITAL SIGNS:  Weight 150 pounds. Blood pressure  138/68. Pulse 80, respiratory rate 18.  GENERAL:  A healthy, elderly, white female in no acute distress.  HEENT:  Negative.  NECK:  Supple. Without thyromegaly.  LYMPH:  There is no supraclavicular or inguinal adenopathy.  ABDOMEN:  Soft,  nontender. No mass, ascites, organomegaly, or hernias noted.  PELVIC:  Inguinal regions are carefully palpated. She has 1 to 2+ femoral  pulses but no adenopathy is noted. EG BUS showed that both the radicle  vulvectomy site on the right and a simple vulvectomy site on the left are  healing well. No new lesions are noted.   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 I discussed this at length with the patient and her  husband. They are in agreement with this plan to go ahead with  completion of her surgical treatment and staging and we will schedule this  for November 13, 2006, most likely as an outpatient. Risks of surgery  including bleeding, infection, wound breakdown and lymph edema were  discussed at length. The patient understands these risks and wished to  proceed with surgery as noted above.      De Blanch, M.D.  Electronically Signed     DC/MEDQ  D:  10/16/2006  T:  10/17/2006  Job:  [id number]   cc:   Telford Nab, R.N.  [address]  [place], [place] [address]   Rande Brunt. Eda Paschal, M.D.  Fax: [phone]   Dollene Cleveland, M.D.  Fax: [phone]

## 2011-05-12 NOTE — Op Note (Signed)
NAMESULY, VUKELICH                  ACCOUNT NO.:  [id number]   MEDICAL RECORD NO.:  [id number]          PATIENT TYPE:  AMB   LOCATION:  [facility]                         FACILITY:  [facility]   PHYSICIAN:  Daniel L. Gottsegen, M.D.DATE OF BIRTH:  October 25, 1926   DATE OF PROCEDURE:  08/07/2006  DATE OF DISCHARGE:                                 OPERATIVE REPORT   PREOPERATIVE DIAGNOSIS:  Large lesion of right labia, possible lesion of  left labia.   POSTOPERATIVE DIAGNOSIS:  Large lesion of right labia, possible lesion of  left labia.   OPERATIONS:  Excision of labial lesion of right labia, punch biopsy of  lesion of left labia.   SURGEON:  Dr. Eda Paschal.   ANESTHESIA:  General.   INDICATIONS:  The patient is a 77 year old female who had presented to the  office with symptoms of vulvar and vaginal yeast infection but a very large  5+ centimeter lesion of the right labia was seen.  Her diabetes was  stabilized by her endocrinologist and then it was elected to bring her to  the operating room to biopsy and excise this cyst lesion completely.  There  also was a question of a lesion of her left labia that will be biopsied as  well.   FINDINGS:  The patient has an erythematous rash over entire groin, labial,  upper legs, lower abdomen area.  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  In addition, however, she has a  lesion of her right labia near her clitoris that is about 5 cm.  It looks  consistent with either condylomata, an infected sebaceous cyst or possibly  even an early vulvar carcinoma.  It appears to be somewhat adherent to the  labia on the right.  On the left, there is some question that she has a  raised lesion because there is so much thickness of her the skin as well as  induration from her dermatological problem.  I cannot be sure but it appears  that there may be a condylomatous change on her left labia as well.  This is  more subtle.   It may be in the range of 2-3 cm and it is just slightly  raised.  Other than this, the rest of pelvic examination is unremarkable.   PROCEDURE:  After adequate general anesthesia, the patient was placed in  dorsal supine position, prepped and draped in the usual sterile manner.  First the right labia lesion was excised.  It was excised with enough of a  margin that it be could be completely excised both around the sides of it  and underneath it.  There was a fair bit of bleeding from it which was  controlled with cautery and then the incision was closed with interrupteds  of 3-0 Monocryl.  After this was done, attention was turned to the left  labia.  Once again, it was somewhat difficult to determine whether this was  truly a lesion or not or just induration of the skin.  A very large punch  biopsy was  obtained and sent to  pathology for tissue diagnosis to be sure this was not a condylomatous or  cancerous change in the left as well.  This biopsy site was treated with  cautery to stop all bleeding.  The patient tolerated the above procedure  well and left the operating room in satisfactory condition.      Daniel L. Eda Paschal, M.D.  Electronically Signed     DLG/MEDQ  D:  08/07/2006  T:  08/07/2006  Job:  [id number]

## 2011-05-12 NOTE — Discharge Summary (Signed)
[facility]  Patient:    [name], [name]                   MRN: [id number] Adm. Date:  [id number] Disc. Date: [id number] Attending:  Colvin Caroli Dictator:   Sherrie George, P.A. CC:         Bernadene Person, M.D.   Discharge Summary  DATE OF BIRTH:  1925-12-28.  ADMITTING DIAGNOSES: 1. Status post right cerebrovascular accident with severe right internal    carotid artery stenosis. 2. Adult onset diabetes mellitus, insulin dependent. 3. Hypothyroidism. 4. Hyperlipidemia.  DISCHARGE DIAGNOSES: 1. Status post right cerebrovascular accident with severe right internal    carotid artery stenosis. 2. Adult onset diabetes mellitus, insulin dependent. 3. Hypothyroidism. 4. Hyperlipidemia.  PROCEDURES:  Right carotid endarterectomy with Dacron patch angioplasty August 09, 2000, Dr. Hart Rochester.  BRIEF HISTORY:  The patient is a 77 year old white female medical patient of Dr. Krystal Clark, referred for evaluation of carotid stenosis.  In June 2001, the patient experienced a right brain CVA with right carotid occlusive disease found during this evaluation.  The patient presented with garbled speech and clumsiness in the left upper extremity while playing piano at church.  The patient was evaluated shortly after that.  A CT of the head revealed a right hemispheric CVA.  Carotid Dopplers at [facility] on June 28, 2000, revealed severe stenosis of the right ICA and moderate stenosis of the left ICA.  Repeat Dopplers in the [facility] office have confirmed these findings, and Dr. Hart Rochester recommended right carotid endarterectomy to reduce the risk of recurrent CVA.  On todays visit, the patient states she experiences mild left upper extremity weakness, no paraesthesia.  There is a mild left facial droop. No difficulty standing or walking and no falling.  No impaired speech.  No headache.  No nausea, vomiting, or vertigo.  No dizziness, no blurred  vision. No amaurosis fugax or seizure.  No dysphagia, confusion, or memory loss.  PAST MEDICAL HISTORY:  As noted above includes: 1. Insulin dependent diabetes. 2. Hypertension. 3. Hypercholesterolemia. 4. Hypothyroidism. 5. New right hemispheric CVA.  PAST SURGICAL HISTORY: 1. Lumpectomy, she is not sure when. 2. Cardiac catheterization 1977.  MEDICATIONS ON ADMISSION: 1. Synthroid.  The dose was uncertain. 2. Plavix 75 mg q.d. 3. Zocor 40 mg q.h.s. 4. Insulin 70/30 34 units b.i.d. 5. A blood pressure medicine which she did not have at the time of her H&P.  ALLERGIES:  None known.  For further history and physical, please see the dictated note.  HOSPITAL COURSE:  The patient was admitted, taken to the operating room, and underwent right carotid endarterectomy with Dacron patch angioplasty on August 09, 2000 by Dr. Hart Rochester.  She tolerated the procedure well and remained neurologically intact.  The first postoperative day, she was seen by Dr. Hart Rochester.  He felt she was neurologically intact with no changes from preoperatively.  The wounds looked good and after mobilization and being able to tolerate a diet, she was discharged home on August 10, 2000.  DISCHARGE MEDICATIONS:  She will resume all of her preadmission medicines as before.  She was also given Tylox 1-2 p.o. q.4h. p.r.n. for pain.  FOLLOW-UP:  She will return in two weeks on Tuesday, August 28, at 11:20 a.m. to see Dr. Hart Rochester.  DISCHARGE ACTIVITY:  Light to moderate.  No lifting over 10 pounds, no driving, no strenuous activity.  CONDITION ON DISCHARGE:  Improving. DD:  08/17/00 TD:  08/19/00 Job: [id number]

## 2011-05-12 NOTE — Op Note (Signed)
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   PROCEDURE:  Right inguinal lymphadenectomy.   SURGEON:  De Blanch, M.D.   FIRST ASSISTANT:  [name], M.D., Telford Nab, R.N.   ANESTHESIA:  General orotracheal tube.   ESTIMATED BLOOD LOSS:  5 mL.   SURGICAL FINDINGS:  There were two slightly enlarged inguinal nodes in  the packet of nodes removed from the superficial group.  No other  abnormalities were noted.   PROCEDURE:  The patient brought to the operating room and after  satisfactory attainment of general anesthesia, was placed in the  modified lithotomy position in [place] stirrups.  The anterior abdominal  wall and right inguinal region were prepped with Betadine and the  patient was draped.  An incision was made 1 cm inferior and parallel to  the inguinal ligament.  Dissection was carried down to the inguinal  ligament superiorly to the adductor longus muscle medially and the  tensor fascia lata laterally.  We maintained a plane of dissection above  the cribriform fascia, dissecting all the lymph nodes in the superficial  group.  The femoral artery was palpated but not exposed throughout this  dissection.  Likewise the saphenous veins were protected throughout the  dissection.  Nodes were submitted to pathology as a single specimen.  The incision was irrigated.  Hemostasis achieved with cautery.  A  Jackson-Pratt drain was placed through stab incision in the lower  abdomen and  secured to the skin with 2-0 silk.  Subcutaneous tissue was reapproximated with interrupted 2-0 Vicryl  sutures and the skin reapproximated with interrupted 3-0 Vicryl sutures.  Steri-Strips were applied.  The patient was awakened from anesthesia and  taken to the recovery room in satisfactory condition.  Sponge, needle  and instrument counts correct x2.      De Blanch, M.D.  Electronically Signed     DC/MEDQ  D:  11/13/2006  T:  11/13/2006  Job:  [id number]   cc:   [name], M.D.  Fax: [phone]   Telford Nab, R.N.  [address]  [place], [place] [address]   Dollene Cleveland, M.D.  Fax: [phone]   Rande Brunt. Eda Paschal, M.D.  Fax: [phone]

## 2011-05-12 NOTE — Consult Note (Signed)
NAMEJALASIA, ESKRIDGE NO.:  [id number]   MEDICAL RECORD NO.:  [id number]          PATIENT TYPE:  OUT   LOCATION:  GYN                          FACILITY:  [facility]   PHYSICIAN:  De Blanch, M.D.DATE OF BIRTH:  10-Apr-1926   DATE OF CONSULTATION:  DATE OF DISCHARGE:                                   CONSULTATION   CHIEF COMPLAINT:  Vulvar cancer.   HISTORY OF PRESENT ILLNESS:  A 77 year old white married female seen in  consultation at the request of Dr. Eda Paschal regarding newly diagnosed  vulvar cancer.  The patient has several-month history of a generalized  cutaneous eruption of the vulva, inguinal region and upper thighs, which  apparently started after she was treated with Diflucan for a vaginal yeast  infection.  She also had a papulosquamous eruption on the trunk and upper  extremities at that time.  Biopsy was obtained and was apparently consistent  with reaction to medication.  She was treated with topical triamcinolone and  Cetaphil and hydroxyzine.  She was also treated with systemic steroids,  although her blood sugars rose quite dramatically.  The patient has had  persistent dermatitis, especially in the vulvovaginal region.  A lesion was  noted on her right vulva, and she subsequently underwent excision of a right  labial lesion and punch biopsy lesion on her left labia August 07, 2006.  Pathology revealed an invasive, well-differentiated squamous cell carcinoma  measuring 2.8 cm in diameter.  Squamous cell carcinoma is present at the  deep and lateral margins of resection.  Biopsy of the left labia showed a  lichen sclerosis.  The patient has had an uncomplicated course since  excisional biopsy.   PAST MEDICAL HISTORY:  Medical illnesses:  Diabetes, hypertension,  hyperlipidemia, hypothyroidism.   PAST SURGICAL HISTORY:  Right breast lumpectomy (benign), cystourethropexy  1977, D&C.   CURRENT MEDICATIONS:  1. Lipitor.  2. Lotrel.  3.  Synthroid.  4. Actos.  5. NovoLog 8 units with each meal.  6. 38 units h.s. baby aspirin.   ALLERGIES:  NONE.   FAMILY HISTORY:  Negative for gynecologic, breast or colon cancer.   SOCIAL HISTORY:  The patient is married.  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  She does not smoke.   OBSTETRIC HISTORY:  Gravida 2.   PHYSICAL EXAMINATION:  VITAL SIGNS:  Weight 155 pounds, height 5 feet four  inches.  GENERAL:  Healthy white elderly female who is in some distress from her  dermatitis.  HEENT:  Negative.  NECK:  Supple without thyromegaly.  ABDOMEN:  Soft and nontender.  No masses, organomegaly, ascites or hernias  noted.  There is a dermatitis overlying her lower abdomen, mons, medial  thighs, labial crural fold and vulva.  There is no inguinal adenopathy.  PELVIC:  EG/BUS shows extensive dermatitis.  In addition, there is white  epithelium overlying the greater portion of her vulva consistent with lichen  sclerosis.  She has a linear scar which is healing on the right labia  majora.  On the left  labia majora is a 1-cm raised lesion which is of some  concern as a possible kissing lesion.  Her anus is normal.  Vagina is  atrophic.  No lesions are noted.  the patient is in a considerable amount of  pain on speculum examination and will not allow a bimanual exam.   IMPRESSION:  Invasive vulvar cancer, at least stage 2.  The patient has  chronic vulvitis which is quite impressive and extensive.   PLAN:  At this juncture, I think it is important to maximize treatment of  her dermatitis.   With regard to treating her vulvar cancer, she would be best managed by a  modified radical vulvectomy on the right side, in order to achieve  adequately clear margins.  At that same time, the lesion on the left vulva  should be excised and sent for frozen section.  If this is invasive, we  would proceed with a left partial radical vulvectomy as well.  In the best  of all  worlds, we would also perform at least a right inguinal  lymphadenectomy, because she is at moderate risk for lymph node metastases  based on the size of her lesion and depth of invasion.  Again, given her  dermatitis, I think it might be best to do this as a staged procedure,  rather than doing both the vulvectomy and inguinal lymphadenopathy at the  same surgery.  Certainly with her diabetes and dermatitis, she is at much  higher risk for wound infection and breakdown.  We will contact Dr. Mayford Knife  to ascertain the likelihood of significant improvement in the next several  weeks for her dermatitis before making further recommendations regarding  vulvar cancer surgery.      De Blanch, M.D.  Electronically Signed     DC/MEDQ  D:  08/14/2006  T:  08/15/2006  Job:  [id number]   cc:   Reuel Boom L. Eda Paschal, M.D.  Fax: [phone]   Telford Nab, R.N.  [address]  [place], [place] [address]   Dollene Cleveland, M.D.  Fax: [phone]

## 2011-05-12 NOTE — Op Note (Signed)
[name], [name] NO.:  [id number]   MEDICAL RECORD NO.:  [id number]          PATIENT TYPE:  OIB   LOCATION:  0098                         FACILITY:  [facility]   PHYSICIAN:  De Blanch, M.D.DATE OF BIRTH:  1926-01-23   DATE OF PROCEDURE:  09/18/2006  DATE OF DISCHARGE:  09/19/2006                                 OPERATIVE REPORT   PREOPERATIVE DIAGNOSIS:  Right vulvar squamous cell carcinoma, left vulvar  lesion.   PROCEDURE:  Right modified radical vulvectomy, left partial simple  vulvectomy.   SURGEON:  De Blanch, M.D.   FIRST ASSISTANT:  Telford Nab, R.N.   ANESTHESIA:  General LMA.   SURGICAL FINDINGS:  Examination under anesthesia revealed extensive lichen  sclerosis of the vulva.  A prior incision line was easily identified on the  right vulva where the patient had previously had a wide local excision that  revealed invasive cancer at the surgical margins.  There was a raised  thickened lesion measured approximately 1 cm on the left vulva.   PROCEDURE:  The patient brought to the operating room and after satisfactory  attainment of general anesthesia, was placed in lithotomy position in candy-  cane stirrups.  Perineum and vagina were prepped with Betadine.  A Foley  catheter was inserted.  The patient was draped.  A wide elliptical incision  was made around the prior scar on the right vulva.  This was carried down to  the pelvic floor fascia.  Hemostasis achieved with cautery.  A similar  procedure was performed on the right vulva although the depth of the  subcutaneous tissue was only approximately 1 cm.  Both specimens were marked  at the apex and pinned on cardboard.  Subcutaneous tissues were evaluated  and hemostasis achieved with cautery.  Subcutaneous tissues were then  reapproximated with interrupted 2-0 Vicryl  sutures.  The skin was reapproximated with interrupted 3-0 Vicryl sutures.  The vulva was extensively  irrigated to avoid irritation from the Betadine.  The patient was awakened from anesthesia and taken to the recovery room in  satisfactory condition.  Sponge, needle and instrument counts correct x2.      De Blanch, M.D.  Electronically Signed     DC/MEDQ  D:  09/18/2006  T:  09/20/2006  Job:  [id number]   cc:   Telford Nab, R.N.  [address]  [place], [place] [address]   Rande Brunt. Eda Paschal, M.D.  Fax: [phone]   Dollene Cleveland, M.D.  Fax: [phone]

## 2011-05-12 NOTE — H&P (Signed)
[facility]  Patient:    [name], [name]                           MRN: [id number] Adm. Date:  08/09/00 Attending:  Quita Skye. Hart Rochester, M.D. Dictator:   Marlowe Kays, P.A.                         History and Physical  DATE OF BIRTH:  [birth year].  CHIEF COMPLAINT:  Right carotid artery stenosis.  HISTORY OF PRESENT ILLNESS:  [age]-year-old female referred by Dr. Bernadene Person for evaluation of carotid artery stenosis.  In June 2001, the patient experienced right brain CVA with right carotid occlusive disease. Her symptoms included a "garbled" speech lasting a few seconds without residual.  Two days later, the patient noticed clumsiness in the left upper extremity while playing piano at the church.  The patient was evaluated shortly after.  A CT of the head revealed right hemispheric CVA.  Carotid Dopplers at [facility] on June 28, 2000 revealed severe stenosis of the right ICA, with moderate stenosis of the left ICA.  Repeat Dopplers at [facility] confirmed the findings.  Dr. Quita Skye. Hart Rochester recommended right CEA to reduce the risk of CVA again.  On todays visit, the patient states she experiences mild left upper extremity weakness, no paresthesia.  There is mild left facial droop.  No difficulty standing or walking and no falling.  No impaired speech.  No headache, nausea, vomiting or vertigo.  No dizziness.  No blurred vision.  No amaurosis fugax or seizures.  No dysphagia, confusion or memory loss.  PAST MEDICAL HISTORY 1. IDDM. 2. Hypertension. 3. Hypercholesterolemia. 4. Hypothyroidism. 5. Recent right hemispheric CVA.  SURGERIES 1. Status post lumpectomy, remote. 2. Status post cardiac catheterization, 1977.  MEDICATIONS 1. Synthroid, unknown dose q.d. 2. Plavix 75 mg q.d. 3. Zocor 40 mg q.h.s. 4. "Blood pressure medication" one p.o. q.d. ______ 5. Insulin 70/30 34 units b.i.d.  ALLERGIES:  No known drug  allergies.  REVIEW OF SYSTEMS:  See HPI and past medical history for significant positives; otherwise, the patient denies any kidney disease, COPD, amaurosis fugax, CAD, angina, claudication, PE, DVT, GI bleed, dysuria or hematuria.  No CHF.  FAMILY HISTORY:  Mother died at 44 of CVA.  Father died at 6 of pneumonia.  SOCIAL HISTORY:  Married.  Two children.  She is a retired Solicitor.  She denies any alcohol or tobacco intake.  PHYSICAL EXAMINATION  GENERAL:  Well-developed, well-nourished 77 year old white female in no acute distress, alert and oriented x 3.  VITAL SIGNS:  Blood pressure 120/70, bilaterally; pulse 58 to 60; respirations 16.  HEENT:  Normocephalic, atraumatic.  PERRLA.  EOMI.  Funduscopic exam within normal limits.  NECK:  Supple.  No JVD.  There is a very soft bruit on the right.  No thyromegaly or lymphadenopathy.  CHEST:  Symmetrical on inspiration.  Lungs clear to auscultation bilaterally.  CARDIOVASCULAR:  Regular rate and rhythm, without murmurs, rubs, or gallops.  ABDOMEN:  Soft and nontender.  Bowel sounds x 4.  No hepatosplenomegaly.  No abdominal bruits.  GU:  Deferred.  RECTAL:  Deferred.  EXTREMITIES:  No clubbing, cyanosis, or edema.  SKIN:  No ulcerations.  Normal hair pattern.  Warm temperature.  PERIPHERAL PULSES:  Carotids 2+ bilaterally with a soft bruit on the right; femorals 2+ bilaterally without bruits; popliteals, dorsalis pedis and  posterior tibialis 2+ bilaterally.  NEUROLOGIC:  Grossly normal with the exception of mild left facial weakness. She has clear speech.  There is also the presence of mild left upper extremity clumsiness.  ASSESSMENT AND PLAN:  [age]-year-old white female with symptomatic right internal carotid artery stenosis, for right carotid endarterectomy, August 09, 2000, Dr. Hart Rochester.  Dr. Hart Rochester has seen and evaluated this patient prior to the admission and has explained the risks and benefits involved  in the procedure and the patient has agreed to continue. DD:  08/15/00 TD:  08/15/00 Job: [id number]

## 2011-05-12 NOTE — Consult Note (Signed)
[name], [name] NO.:  [id number]   MEDICAL RECORD NO.:  [id number]          PATIENT TYPE:  OUT   LOCATION:  GYN                          FACILITY:  [facility]   PHYSICIAN:  De Blanch, M.D.DATE OF BIRTH:  [birth year]   DATE OF CONSULTATION:  DATE OF DISCHARGE:  09/09/2008                                 CONSULTATION   CHIEF COMPLAINT:  Vulvar cancer, vulvar lesion.   INTERVAL HISTORY:  The patient returns today for routine follow-up.  On  questioning, she does indicate that she has had for several weeks two  lesions on the vulva, which are not painful and she has not had any  bleeding, but are new.  She denies any urinary tract symptoms.   HISTORY OF PRESENT ILLNESS:  The patient initially had a squamous cell  carcinoma on the right vulva, undergoing a modified radical vulvectomy  and right inguinal lymphadenectomy in September 2007.  All nodes and  margins were negative.  The patient was followed until October 2008,  when she developed a lesion on her left anterior vulva extending near  the urethra.  She underwent a left modified radical vulvectomy at [facility] in November 2008.  The lesion was 2.5 cm and was to close to  the urethra with a 3-mm margin.  A right inguinal lymphadenectomy was  not performed because of the patient's age and overall poor medical  condition.   PAST MEDICAL HISTORY:   MEDICAL ILLNESSES:  1. Diabetes.  2. Hypertension.  3. Hyperlipidemia.  4. Hypothyroidism.   PAST SURGICAL HISTORY:  1. Breast lumpectomy.  2. Cystourethropexy.  3. D&C.  4. Modified right radical vulvectomy.  5. Modified left radical vulvectomy.  6. Right inguinal femoral lymphadenectomy.   CURRENT MEDICATIONS:  Lipitor, Lotrel, Synthroid, Actos, baby aspirin  and NovoLog.   DRUG ALLERGIES:  NONE.   FAMILY HISTORY:  Negative gynecologic, breast or colon cancer.   REVIEW OF SYSTEMS:  A 10-point comprehensive review of systems negative  except as noted above.   PHYSICAL EXAMINATION:  VITAL SIGNS:  Weight 158 pounds, blood pressure  157/69.  GENERAL:  The patient is a pleasant white female in no acute distress.  HEENT:  Negative.  NECK:  Supple without thyromegaly.  There is no supraclavicular or  inguinal adenopathy.  Her right inguinal incision is well-healed.  She  does not have any lymphedema.  PELVIC:  EG/BUS is status post modified radical vulvectomy.  On the left  vulva, there are two lesions; one nearly centered in the midline which  is somewhat granular in nature, the other being on the left side on the  labia minora in the upper one third of the vulva, which is more nodular  with intact skin.  Vagina is atrophic and no lesions are noted, it is  somewhat stenotic.   PROCEDURE NOTE:  After preparation with Betadine and infiltration with  1% Xylocaine anesthesia, representative biopsies were obtained from both  of these vulvar lesions.  It should be noted that the lower vulvar  lesion on the left has more sebaceous material  in it when the biopsy was  obtained.  It will also be submitted to pathology, and we will contact  the patient as soon as these reports are available.  Hemostasis was  achieved with silver nitrate.      De Blanch, M.D.  Electronically Signed     DC/MEDQ  D:  09/13/2008  T:  09/15/2008  Job:  [id number]   cc:   Telford Nab, R.N.  [address]  [place], [place] [address]   Rande Brunt. Eda Paschal, M.D.  Fax: [phone]

## 2011-05-12 NOTE — Op Note (Signed)
[facility]  Patient:    [name], [name]                           MRN: [id number] Proc. Date: 08/09/00 Attending:  Quita Skye. Hart Rochester, M.D. CC:         Bernadene Person, M.D.   Operative Report  PREOPERATIVE DIAGNOSIS:  Recent right brain cerebrovascular accident with severe right carotid occlusive disease.  POSTOPERATIVE DIAGNOSIS:  Recent right brain cerebrovascular accident with severe right carotid occlusive.  OPERATION:  Right carotid endarterectomy with Dacron patch angioplasty.  SURGEON:  Quita Skye. Hart Rochester, M.D.  FIRST ASSISTANT:  Nurse.  ANESTHESIA:  General endotracheal.  BRIEF HISTORY:  This patient suffered a right brain CVA about seven weeks ago consisting of some left facial and upper extremity weakness and clumsiness and mild left lower extremity weakness.  This has significantly improved and almost completely resolved, and she was found to have moderately severe right internal carotid occlusive disease.  She was scheduled for a right carotid endarterectomy.  DESCRIPTION OF PROCEDURE:  The patient was taken to the operating room and placed in the supine position, at which time satisfactory general endotracheal anesthesia was administered.  The right neck was prepped with Betadine scrub and solution and draped in the routine sterile manner.  Incision was made along the anterior border of the sternocleidomastoid muscle, carried down through subcutaneous tissue and platysma using the Bovie.  The common facial vein and external jugular vein were ligated with 3-0 silk ties and divided, exposing the common, internal, and external carotid arteries.  Care was taken not to injure the vagus or hypoglossal nerves, both of which were exposed. There was a calcified atherosclerotic plaque at the carotid bifurcation, extending up the internal carotid artery about 3-4 cm.  The distal vessel appeared normal.  The plaque extended down the common carotid  about 3-4 cm as well.  A #10 shunt was inserted without difficulty, establishing flow in about two minutes.  Standard endarterectomy was then performed using elevator and the Potts scissors, with eversion endarterectomy of the external carotid.  The plaque feathered off the distal internal carotid artery nicely, not requiring any tacking sutures.  The lumen was thoroughly irrigated with heparin and saline, all loose debris carefully removed, and the arteriotomy was closed with a patch using continuous 6-0 Prolene.  Prior to completion of the closure, the shunt was removed after about 30 minutes of shunt time. Following antegrade and retrograde flushing, the closure was completed to reestablish the flow initially up the external and then the internal branch. The carotid was occluded for less than two minutes for removal of the shunt. Protamine was then given to reverse the heparin.  Following adequate hemostasis, the wound was irrigated with saline and closed in layers with Vicryl in a subcuticular fashion, sterile dressing applied, patient taken to the recovery room in satisfactory condition. DD:  08/09/00 TD:  08/09/00 Job: [id number]

## 2011-05-12 NOTE — Consult Note (Signed)
NAMENABRIA, [name] NO.:  [id number]   MEDICAL RECORD NO.:  [id number]          PATIENT TYPE:  OUT   LOCATION:  GYN                          FACILITY:  [facility]   PHYSICIAN:  De Blanch, M.D.DATE OF BIRTH:  10-19-1926   DATE OF CONSULTATION:  02/22/2007  DATE OF DISCHARGE:                                 CONSULTATION   CHIEF COMPLAINT:  Vulvar cancer.   INTERVAL HISTORY:  Since her last visit the patient has done well.  She  has had no difficulty following her right inguinal lymphadenectomy.  Her  main complaint is that of some pruritus over the central portion the  vulva.  She denies any lymphedema.   HISTORY OF PRESENT ILLNESS:  The patient underwent a modified radical  vulvectomy in the right vulva in September 2007 which showed an area of  invasive vulvar carcinoma.  This followed a prior procedure which is a  wide local excision of a lesion which was slightly in excess of 2 cm.  Subsequently, the patient underwent a right inguinal lymphadenectomy in  November 2007 with all lymph nodes being negative for any metastatic  disease.   PAST MEDICAL HISTORY/MEDICAL ILLNESS:  1. Diabetes.  2. Hypertension.  3. Lipidemia.  4. Hypothyroidism.   PAST SURGICAL HISTORY:  1. Breast lumpectomy.  2. Cystourethropexy  3. D&C modified radical vulvectomy.  4. Right inguinal femoral lymphadenectomy.   CURRENT MEDICATIONS:  1. Lipitor.  2. Lotrel.  3. Synthroid.  4. Actos.  5. NovoLog 8 units with each meal, 38 units at bedtime.  6. Baby aspirin.   DRUG ALLERGIES:  None.   FAMILY HISTORY:  Negative for gynecologic breast or colon cancer.   SOCIAL HISTORY:  The patient is married.  She is retired.  Former  smoker.   REVIEW OF SYSTEMS:  A 10-point comprehensive review of systems is  negative except as noted above.   PHYSICAL EXAM:  VITAL SIGNS:  Weight 151 pounds of pressure 148/72,  pulse 80, respiratory rate 20.  GENERAL:  The patient is a  healthy elderly white female in no acute  distress.  HEENT:  Negative.  NECK:  Supple without thyromegaly.  There is no supraclavicular or  inguinal adenopathy.  The left inguinal incision is well-healed.  ABDOMEN:  Soft, nontender.  No masses, organomegaly, ascites, or hernias  noted.  PELVIC EXAM:  EG/BUS status post modified right radical vulvectomy, well-  healed.  In the central portion of the vulva of the clitoral hood is  agglutinated; and over this is a thickened hyperplastic epithelium.  There are no gross lesions suggestive of invasive cancer, vagina is  normal.  LOWER EXTREMITIES:  No edema or varicosities.   IMPRESSION:  1. 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  No evidence of      recurrent disease.  2. Vulvar hypertrophy secondary to scratching.   PLAN:  The patient is given a prescription for Temovate ointment to be  applied twice a day to the area where she has pruritus.  She will return  to see me in 3 months  for continuing follow up.   INDICATIONS:  Thank you      De Blanch, M.D.  Electronically Signed     DC/MEDQ  D:  02/22/2007  T:  02/22/2007  Job:  [id number]   cc:   Reuel Boom L. Eda Paschal, M.D.  Fax: [phone]   Telford Nab, R.N.  [address]  [place], [place] [address]   Dr. Lissa Merlin

## 2011-05-13 ENCOUNTER — Emergency Department (HOSPITAL_COMMUNITY)
Admission: EM | Admit: 2011-05-13 | Discharge: 2011-05-13 | Disposition: A | Discharge status: Home / Self Care | Payer: Medicare Other | Attending: Emergency Medicine | Admitting: Emergency Medicine

## 2011-05-13 DIAGNOSIS — I10 Essential (primary) hypertension: Secondary | ICD-10-CM | POA: Insufficient documentation

## 2011-05-13 DIAGNOSIS — R197 Diarrhea, unspecified: Secondary | ICD-10-CM | POA: Insufficient documentation

## 2011-05-13 DIAGNOSIS — E039 Hypothyroidism, unspecified: Secondary | ICD-10-CM | POA: Insufficient documentation

## 2011-05-13 DIAGNOSIS — K625 Hemorrhage of anus and rectum: Secondary | ICD-10-CM | POA: Insufficient documentation

## 2011-05-13 DIAGNOSIS — E119 Type 2 diabetes mellitus without complications: Secondary | ICD-10-CM | POA: Insufficient documentation

## 2011-05-13 DIAGNOSIS — Z794 Long term (current) use of insulin: Secondary | ICD-10-CM | POA: Insufficient documentation

## 2011-05-13 LAB — BASIC METABOLIC PANEL
BUN: 24 mg/dL — ABNORMAL HIGH (ref 6–23)
CO2: 24 mEq/L (ref 19–32)
Calcium: 9.3 mg/dL (ref 8.4–10.5)
GFR calc Af Amer: 42 mL/min — ABNORMAL LOW (ref >60)
Glucose, Bld: 135 mg/dL — ABNORMAL HIGH (ref 70–99)

## 2011-05-13 LAB — CBC
HCT: 35.4 % — ABNORMAL LOW (ref 36.0–46.0)
Hemoglobin: 11.5 g/dL — ABNORMAL LOW (ref 12.0–15.0)
MCH: 31.3 pg (ref 26.0–34.0)
MCV: 96.2 fL (ref 78.0–100.0)
RDW: 13 % (ref 11.5–15.5)

## 2011-05-13 LAB — DIFFERENTIAL
Eosinophils Absolute: 0.1 10*3/uL (ref 0.0–0.7)
Lymphocytes Relative: 15 % (ref 12–46)
Monocytes Relative: 9 % (ref 3–12)

## 2011-05-13 LAB — OCCULT BLOOD, POC DEVICE: Fecal Occult Bld: POSITIVE

## 2011-08-11 ENCOUNTER — Other Ambulatory Visit: Payer: Self-pay | Admitting: Gynecology

## 2011-08-11 ENCOUNTER — Ambulatory Visit: Payer: Medicare Other | Attending: Gynecology | Admitting: Gynecology

## 2011-08-11 DIAGNOSIS — E785 Hyperlipidemia, unspecified: Secondary | ICD-10-CM | POA: Insufficient documentation

## 2011-08-11 DIAGNOSIS — R32 Unspecified urinary incontinence: Secondary | ICD-10-CM | POA: Insufficient documentation

## 2011-08-11 DIAGNOSIS — E039 Hypothyroidism, unspecified: Secondary | ICD-10-CM | POA: Insufficient documentation

## 2011-08-11 DIAGNOSIS — E119 Type 2 diabetes mellitus without complications: Secondary | ICD-10-CM | POA: Insufficient documentation

## 2011-08-11 DIAGNOSIS — C519 Malignant neoplasm of vulva, unspecified: Secondary | ICD-10-CM | POA: Insufficient documentation

## 2011-08-11 DIAGNOSIS — I10 Essential (primary) hypertension: Secondary | ICD-10-CM | POA: Insufficient documentation

## 2011-08-11 DIAGNOSIS — Z79899 Other long term (current) drug therapy: Secondary | ICD-10-CM | POA: Insufficient documentation

## 2011-08-14 NOTE — Consult Note (Signed)
NAMERYELEIGH, SANTORE NO.:  [id number]  MEDICAL RECORD NO.:  [id number]  LOCATION:  GYN                          FACILITY:  [facility]  PHYSICIAN:  De Blanch, M.D.DATE OF BIRTH:  [birth year]  DATE OF CONSULTATION:  08/11/2011 DATE OF DISCHARGE:                                CONSULTATION   CHIEF COMPLAINT:  Vulvar cancer, perineal burning.  INTERVAL HISTORY:  The patient returns today as previously scheduled for routine followup.  She reports that her vulva is "galled," which she blames on her urinary incontinence and wearing pads continually.  She denies any bleeding.  She has no other symptoms.  HISTORY OF PRESENT ILLNESS:  The patient was initially treated for invasive vulvar carcinoma in the right vulva in September 2007.  She underwent a modified radical vulvectomy and right inguinal lymphadenectomy.  Nodes and margins were negative.  She was followed until October 2008 and she developed a recurrence on the contralateral vulva and underwent a left modified radical vulvectomy.  The lesion was 2.5 cm in diameter and was just lateral to the urethra.  Given her age and comorbidities, we did not do an inguinal lymphadenectomy.  She developed yet another recurrence in October 2009 on the left vulva and after excision, she had reconstruction using a rhomboid flap.  Surgical margins were again negative.  We will follow the patient since October 2009 with no evidence of recurrent disease.  PAST MEDICAL HISTORY/MEDICAL ILLNESSES: 1. Diabetes 2. Hypertension. 3. Hyperlipidemia. 4. Hypothyroidism.  PAST SURGICAL HISTORY:  Breast lumpectomy; cystourethropexy; D and C; modified radical vulvectomy in 2007, 2008, and 2009; right inguinal femoral lymphadenectomy in 2007.  CURRENT MEDICATIONS:  Lipitor, Lotrel, Synthroid, Actos, NovoLog b.i.d., and baby aspirin.  DRUG ALLERGIES:  None.  FAMILY HISTORY:  Negative for gynecologic, breast, or colon  cancer.  SOCIAL HISTORY:  The patient is married.  Husband is a Engineer, civil (consulting). She does not smoke.  REVIEW OF SYSTEMS:  Ten-point comprehensive review of systems negative except as noted above.  PHYSICAL EXAMINATION:  VITAL SIGNS:  Weight 161 pounds, blood pressure 128/68. GENERAL:  The patient is a healthy white female and in no acute distress. HEENT:  Negative. NECK:  Supple without thyromegaly.  There is no supraclavicular or inguinal adenopathy. ABDOMEN:  Soft, nontender.  No masses, organomegaly, ascites, or hernias noted. PELVIC:  EGBUS is altered by prior vulvectomies and rhomboid flap.  On careful inspection, there are some hyperkeratotic areas and the approximately 1.5 cm area that looks ulcerative on the posterior left vulva.  PROCEDURE NOTE:  After 1% Xylocaine injection, a Keyes punch biopsies obtained from the center of the lesion on the left posterior vulva. Hemostasis was achieved with silver nitrate.  IMPRESSION:  Past history of recurrent vulvar carcinomas.  Today, she has a lesion, which is suspicious for a yet another recurrence in the left posterior vulva.  We will await biopsy reports and plan management accordingly.     De Blanch, M.D.     DC/MEDQ  D:  08/11/2011  T:  08/12/2011  Job:  [id number]  cc:   Telford Nab, R.N. [address] [place], [place] [address]  Isac Sarna  Lorenso Quarry, M.D. Fax: [phone]  Electronically Signed by De Blanch M.D. on 08/14/2011 11:38:50 AM

## 2011-09-01 ENCOUNTER — Other Ambulatory Visit: Payer: Self-pay | Admitting: Gynecologic Oncology

## 2011-09-01 ENCOUNTER — Encounter (HOSPITAL_COMMUNITY)
Admission: RE | Admit: 2011-09-01 | Discharge: 2011-09-01 | Payer: Medicare Other | Source: Ambulatory Visit | Attending: Obstetrics & Gynecology | Admitting: Obstetrics & Gynecology

## 2011-09-01 ENCOUNTER — Ambulatory Visit (HOSPITAL_COMMUNITY)
Admission: RE | Admit: 2011-09-01 | Discharge: 2011-09-01 | Disposition: A | Discharge status: Home / Self Care | Payer: Medicare Other | Source: Ambulatory Visit | Attending: Gynecologic Oncology | Admitting: Gynecologic Oncology

## 2011-09-01 DIAGNOSIS — M899 Disorder of bone, unspecified: Secondary | ICD-10-CM | POA: Insufficient documentation

## 2011-09-01 DIAGNOSIS — Z01810 Encounter for preprocedural cardiovascular examination: Secondary | ICD-10-CM | POA: Insufficient documentation

## 2011-09-01 DIAGNOSIS — M949 Disorder of cartilage, unspecified: Secondary | ICD-10-CM | POA: Insufficient documentation

## 2011-09-01 DIAGNOSIS — Z01818 Encounter for other preprocedural examination: Secondary | ICD-10-CM

## 2011-09-01 DIAGNOSIS — N9089 Other specified noninflammatory disorders of vulva and perineum: Secondary | ICD-10-CM | POA: Insufficient documentation

## 2011-09-01 DIAGNOSIS — Z01812 Encounter for preprocedural laboratory examination: Secondary | ICD-10-CM | POA: Insufficient documentation

## 2011-09-01 DIAGNOSIS — I700 Atherosclerosis of aorta: Secondary | ICD-10-CM | POA: Insufficient documentation

## 2011-09-01 LAB — CBC
HCT: 37.9 % (ref 36.0–46.0)
Hemoglobin: 12 g/dL (ref 12.0–15.0)
MCH: 31.9 pg (ref 26.0–34.0)
MCHC: 31.7 g/dL (ref 30.0–36.0)
MCV: 100.8 fL — ABNORMAL HIGH (ref 78.0–100.0)
Platelets: 138 10*3/uL — ABNORMAL LOW (ref 150–400)
RBC: 3.76 MIL/uL — ABNORMAL LOW (ref 3.87–5.11)
RDW: 13.2 % (ref 11.5–15.5)
WBC: 6.7 10*3/uL (ref 4.0–10.5)

## 2011-09-01 LAB — DIFFERENTIAL
Basophils Absolute: 0 10*3/uL (ref 0.0–0.1)
Basophils Relative: 0 % (ref 0–1)
Eosinophils Absolute: 0.1 10*3/uL (ref 0.0–0.7)
Eosinophils Relative: 1 % (ref 0–5)
Lymphocytes Relative: 16 % (ref 12–46)
Lymphs Abs: 1.1 10*3/uL (ref 0.7–4.0)
Monocytes Absolute: 0.6 10*3/uL (ref 0.1–1.0)
Monocytes Relative: 9 % (ref 3–12)
Neutro Abs: 4.9 10*3/uL (ref 1.7–7.7)
Neutrophils Relative %: 73 % (ref 43–77)

## 2011-09-01 LAB — BASIC METABOLIC PANEL
GFR calc Af Amer: 35 mL/min — ABNORMAL LOW (ref >60)
GFR calc non Af Amer: 29 mL/min — ABNORMAL LOW (ref >60)
Potassium: 4.1 mEq/L (ref 3.5–5.1)
Sodium: 144 mEq/L (ref 135–145)

## 2011-09-01 LAB — SURGICAL PCR SCREEN
MRSA, PCR: NEGATIVE
Staphylococcus aureus: POSITIVE — AB

## 2011-09-15 ENCOUNTER — Other Ambulatory Visit: Payer: Self-pay | Admitting: Anesthesiology

## 2011-09-15 ENCOUNTER — Encounter (HOSPITAL_COMMUNITY): Payer: Medicare Other

## 2011-09-15 LAB — BASIC METABOLIC PANEL
CO2: 29 mEq/L (ref 19–32)
Chloride: 105 mEq/L (ref 96–112)
Sodium: 143 mEq/L (ref 135–145)

## 2011-09-15 LAB — CBC
HCT: 34.8 % — ABNORMAL LOW (ref 36.0–46.0)
Hemoglobin: 11.3 g/dL — ABNORMAL LOW (ref 12.0–15.0)
MCHC: 32.5 g/dL (ref 30.0–36.0)
MCV: 99.1 fL (ref 78.0–100.0)
WBC: 4.7 10*3/uL (ref 4.0–10.5)

## 2011-09-19 ENCOUNTER — Ambulatory Visit (HOSPITAL_COMMUNITY): Payer: Medicare Other

## 2011-09-19 ENCOUNTER — Other Ambulatory Visit: Payer: Self-pay | Admitting: Gynecologic Oncology

## 2011-09-19 ENCOUNTER — Ambulatory Visit (HOSPITAL_COMMUNITY)
Admission: RE | Admit: 2011-09-19 | Discharge: 2011-09-21 | Disposition: A | Discharge status: Home / Self Care | Payer: Medicare Other | Source: Ambulatory Visit | Attending: Obstetrics & Gynecology | Admitting: Obstetrics & Gynecology

## 2011-09-19 DIAGNOSIS — J811 Chronic pulmonary edema: Secondary | ICD-10-CM | POA: Insufficient documentation

## 2011-09-19 DIAGNOSIS — Z01812 Encounter for preprocedural laboratory examination: Secondary | ICD-10-CM | POA: Insufficient documentation

## 2011-09-19 DIAGNOSIS — Z79899 Other long term (current) drug therapy: Secondary | ICD-10-CM | POA: Insufficient documentation

## 2011-09-19 DIAGNOSIS — Z7982 Long term (current) use of aspirin: Secondary | ICD-10-CM | POA: Insufficient documentation

## 2011-09-19 DIAGNOSIS — Y836 Removal of other organ (partial) (total) as the cause of abnormal reaction of the patient, or of later complication, without mention of misadventure at the time of the procedure: Secondary | ICD-10-CM | POA: Insufficient documentation

## 2011-09-19 DIAGNOSIS — R062 Wheezing: Secondary | ICD-10-CM | POA: Insufficient documentation

## 2011-09-19 DIAGNOSIS — E119 Type 2 diabetes mellitus without complications: Secondary | ICD-10-CM | POA: Insufficient documentation

## 2011-09-19 DIAGNOSIS — I10 Essential (primary) hypertension: Secondary | ICD-10-CM | POA: Insufficient documentation

## 2011-09-19 DIAGNOSIS — C519 Malignant neoplasm of vulva, unspecified: Secondary | ICD-10-CM | POA: Insufficient documentation

## 2011-09-19 DIAGNOSIS — R000 Tachycardia, unspecified: Secondary | ICD-10-CM | POA: Insufficient documentation

## 2011-09-19 DIAGNOSIS — I4891 Unspecified atrial fibrillation: Secondary | ICD-10-CM | POA: Insufficient documentation

## 2011-09-19 DIAGNOSIS — I519 Heart disease, unspecified: Secondary | ICD-10-CM | POA: Insufficient documentation

## 2011-09-19 DIAGNOSIS — R0602 Shortness of breath: Secondary | ICD-10-CM | POA: Insufficient documentation

## 2011-09-19 DIAGNOSIS — E785 Hyperlipidemia, unspecified: Secondary | ICD-10-CM | POA: Insufficient documentation

## 2011-09-19 LAB — GLUCOSE, CAPILLARY
Glucose-Capillary: 103 mg/dL — ABNORMAL HIGH (ref 70–99)
Glucose-Capillary: 99 mg/dL (ref 70–99)
Glucose-Capillary: 105 mg/dL — ABNORMAL HIGH (ref 70–99)
Glucose-Capillary: 184 mg/dL — ABNORMAL HIGH (ref 70–99)

## 2011-09-20 ENCOUNTER — Ambulatory Visit (HOSPITAL_COMMUNITY): Payer: Medicare Other

## 2011-09-20 LAB — BASIC METABOLIC PANEL
BUN: 26 mg/dL — ABNORMAL HIGH (ref 6–23)
Chloride: 107 mEq/L (ref 96–112)
GFR calc Af Amer: 45 mL/min — ABNORMAL LOW (ref >60)
GFR calc non Af Amer: 37 mL/min — ABNORMAL LOW (ref >60)
Potassium: 4.6 mEq/L (ref 3.5–5.1)
Sodium: 139 mEq/L (ref 135–145)

## 2011-09-20 LAB — CARDIAC PANEL(CRET KIN+CKTOT+MB+TROPI)
CK, MB: 2.5 ng/mL (ref 0.3–4.0)
Total CK: 103 U/L (ref 7–177)

## 2011-09-20 LAB — GLUCOSE, CAPILLARY
Glucose-Capillary: 136 mg/dL — ABNORMAL HIGH (ref 70–99)
Glucose-Capillary: 145 mg/dL — ABNORMAL HIGH (ref 70–99)
Glucose-Capillary: 213 mg/dL — ABNORMAL HIGH (ref 70–99)

## 2011-09-20 LAB — MAGNESIUM: Magnesium: 1.7 mg/dL (ref 1.5–2.5)

## 2011-09-20 LAB — PHOSPHORUS: Phosphorus: 2.8 mg/dL (ref 2.3–4.6)

## 2011-09-20 LAB — CBC
HCT: 29.3 % — ABNORMAL LOW (ref 36.0–46.0)
Hemoglobin: 9.4 g/dL — ABNORMAL LOW (ref 12.0–15.0)
RBC: 2.9 MIL/uL — ABNORMAL LOW (ref 3.87–5.11)
WBC: 5.6 10*3/uL (ref 4.0–10.5)

## 2011-09-20 LAB — PRO B NATRIURETIC PEPTIDE: Pro B Natriuretic peptide (BNP): 2866 pg/mL — ABNORMAL HIGH (ref 0–450)

## 2011-09-21 LAB — GLUCOSE, CAPILLARY: Glucose-Capillary: 95 mg/dL (ref 70–99)

## 2011-09-25 LAB — GLUCOSE, CAPILLARY
Glucose-Capillary: 104 — ABNORMAL HIGH
Glucose-Capillary: 115 — ABNORMAL HIGH
Glucose-Capillary: 217 — ABNORMAL HIGH
Glucose-Capillary: 95

## 2011-09-25 LAB — BASIC METABOLIC PANEL
Calcium: 9.3
GFR calc non Af Amer: 50 — ABNORMAL LOW
Glucose, Bld: 79
Potassium: 3.6
Sodium: 144

## 2011-09-25 LAB — HEMOGLOBIN AND HEMATOCRIT, BLOOD
HCT: 34.7 — ABNORMAL LOW
Hemoglobin: 11.6 — ABNORMAL LOW

## 2011-09-26 NOTE — Op Note (Signed)
  [name], [name] NO.:  [id number]  MEDICAL RECORD NO.:  [id number]  LOCATION:  1531                         FACILITY:  [facility]  PHYSICIAN:  Broly Hatfield A. Duard Brady, MD    DATE OF BIRTH:  [birth year]  DATE OF PROCEDURE:  09/19/2011 DATE OF DISCHARGE:                              OPERATIVE REPORT   PREOPERATIVE DIAGNOSIS:  Recurrent vulvar cancer.  POSTOPERATIVE DIAGNOSIS:  Recurrent vulvar cancer.  PROCEDURE:  Right local excision of the vulva.  SURGEONS:  Kasten Leveque A. Duard Brady, MD and [name], M.D.  ANESTHESIA:  General and local with 12 cc of 1% Marcaine.  ESTIMATED BLOOD LOSS:  Minimal.  IV FLUIDS:  900 mL 100 of heparin.  URINE OUTPUT:  150 mL.  COMPLICATIONS:  None.  SPECIMENS:  Vulva, suture at 12 to Pathology.  OPERATIVE FINDINGS:  Included a 3 cm vulvar tumor involving the lower aspect of the labia on her left side extending across the midline.  The patient was seen in the preop holding area and identified itself. Risks and benefits of the procedure were discussed with the patient.  O2 sats in the preop holding area were 99.  The patient was then taken to the operating room, placed in supine position.  O2 sats drop to 84%. Evaluation by Anesthesia felt appropriate to proceed with surgery. General anesthesia was then induced.  She was then placed in dorsal lithotomy position.  The perineum was prepped in the usual fashion with the findings as above.  Time-out was performed to confirm the patient, the procedure, and antibiotic and allergy status.  The patient was then draped and a Foley catheter was inserted into the bladder under sterile conditions.  With a knife an incision approximately half a centimeter away from the visible tumor margin was made with the knife and carried circumferentially around the tumor.  The tumor was grasped with Allis clamps.  Due to proximity to the rectum, surgeon's finger was placed into the rectum.  Using  Bovie cautery, the lesion was dissected off. There were grossly negative margins visually.  There was noted be hemostatic and it was marked at 12 and handed off.  Interrupted sutures of 2-0 Vicryl on an SH were used for deep reapproximation in two layers of the deep tissue.  The skin was closed using a 3-0 Vicryl on an SH with a series of interrupted and mattress sutures.  An additional 7 mL for a total of 12 mL of  lidocaine were used.  The patient tolerated the procedure well, was taken to the recovery room in stable condition.  Chest x-ray per Anesthesia in the PACU. Instrument and needle counts were correct x2.     Keddrick Wyne A. Duard Brady, MD     PAG/MEDQ  D:  09/19/2011  T:  09/19/2011  Job:  [id number]  cc:   Telford Nab, R.N. [address] [place], [place] [address]  [name], M.D. Fax: [phone]  W. Paticia Stack, M.D. Fax: [phone]  [name], M.D. Fax: [phone]  Electronically Signed by Cleda Mccreedy MD on 09/26/2011 09:27:10 AM

## 2011-09-29 ENCOUNTER — Ambulatory Visit: Payer: Medicare Other | Attending: Gynecology | Admitting: Gynecology

## 2011-09-29 DIAGNOSIS — C519 Malignant neoplasm of vulva, unspecified: Secondary | ICD-10-CM | POA: Insufficient documentation

## 2011-10-02 NOTE — Consult Note (Signed)
  NAMEPATT, STEINHARDT NO.:  [id number]  MEDICAL RECORD NO.:  [id number]  LOCATION:  GYN                          FACILITY:  [facility]  PHYSICIAN:  De Blanch, M.D.DATE OF BIRTH:  08/25/1926  DATE OF CONSULTATION:  09/29/2011 DATE OF DISCHARGE:                                CONSULTATION   CHIEF COMPLAINT:  Recurrent vulvar cancer.  INTERVAL HISTORY:  Ms. Popescu underwent wide excision of recurrent vulvar carcinoma on the posterior vulva on September 19, 2011.  Final pathology showed negative margins, although they were close to the deep margin (1 mm).  The patient has had an uncomplicated course and feels much better now that this lesion is off her vulva.  She does have some probable wound breakdown.  Otherwise, she is doing well.  PHYSICAL EXAMINATION:  VITAL SIGNS:  In the patient's chart. PELVIC EXAM:  EGBUS shows that the vulva is status post multiple prior procedures.  In the posterior vulva near the introitus, there is some wound separation.  There is no evidence of infection.  IMPRESSION:  Recurrent vulvar cancer status post wide excision, now with some wound breakdown without infection.  The patient will use sitz baths and irrigation and apply Silvadene cream.  Will have her return to see me in approximately 6 weeks for followup.     De Blanch, M.D.  cc: Telford Nab RN MSN    DC/MEDQ  D:  09/29/2011  T:  09/30/2011  Job:  [id number]  Electronically Signed by De Blanch M.D. on 10/02/2011 09:14:38 AM

## 2011-10-04 NOTE — Discharge Summary (Signed)
[name], [name] NO.:  [id number]  MEDICAL RECORD NO.:  [id number]  LOCATION:  1531                         FACILITY:  [facility]  PHYSICIAN:  [name], M.D.DATE OF BIRTH:  [birth year]  DATE OF ADMISSION:  09/19/2011 DATE OF DISCHARGE:  09/21/2011                              DISCHARGE SUMMARY   CHIEF COMPLAINT:  The patient is an 77 year old with recurrent vulvar cancer who presents for a wide local excision of the vulva.  Please see the dictated history and physical for further details.  HOSPITAL COURSE:  The patient was admitted and underwent a wide local excision of the vulva.  Please see the dictated operative summary for findings. Intraoperatively, the patient had an episode of oxygen desaturation and associated tachycardia.  The patient complained on postoperative day #1 of wheezing.  In the PACU, a chest x-ray showed interstitial edema, findings consistent with mild pulmonary edema.  The patient was given a low dose of intravenous Lasix.  She was diuresed. Her O2 saturations improved.  A repeat chest x-ray on postoperative day #1 showed improvement in the pulmonary edema.  The patient also received nebulizer treatments and the wheezing which was felt to be from the upper airways improved as well.  A cardiac enzyme panel was normal.  Her electrolytes were normal.  The patient's CBGs remained in range.  A BNP was 2866 on September 26th.  The pharmacy queried the continued use of Actos in this setting.  Dr. [name] was contacted and the Actos was discontinued.  Also on postoperative day #1, the patient had an episode of tachycardia with a heart rate in the 120s; this was episodic.  A 12- lead EKG showed normal sinus rhythm with a ventricular rate at 83 beats per minute.  Dr. Anne Fu of [facility] was consulted and it was recommended to increase her diltiazem from 240 mg q.a.m. to 360 mg.  On September 27th, the patient's heart rate was in  the 60 to 80 beats per minute range.  DISCHARGE DIAGNOSES:  Vulvar cancer, mild pulmonary edema, and cardiac tachyarrhythmia.  PROCEDURE:  Wide local excision of the vulva.  CONDITION:  Stable.  DIET:  ADA diet.  MEDICATIONS: 1. Diltiazem CD/XT 360 mg 1 tablet p.o. q.a.m. 2. Percocet 5/325 mg 1-2 tablets every 6 hours as needed. 3. Alendronate 70 mg 1 tablet weekly. 4. Atorvastatin 20 mg 1 tablet p.o. q.a.m. 5. Diovan 320 mg q.a.m. 6. Doxazosin 2 mg 1 tablet p.o. q.a.m. 7. Indapamide 1.25 mg 1 tablet nightly. 8. Levemir insulin 44 units q.a.m. 10 units at supper. 9. Muro/128 one drop in both eyes daily nightly. 10.NovoLog insulin 10 units in the morning, 12 units at lunch, and 22     units at supper. 11.Ocuvite 1 tablet p.o. daily. 12.Synthroid 100 mcg 1 tablet p.o. q.a.m. 13.Vitamin D 50,000 units 1 capsule weekly.  DISPOSITION:  The patient was to follow up with Dr. [name] and with Dr. Anne Fu.  The patient is to follow up in the GYN Oncology office for a wound check on Monday, October 1st.  ACTIVITY:  Pelvic rest.     [name], M.D.  LAJ/MEDQ  D:  09/21/2011  T:  09/21/2011  Job:  [id number]  cc:   Telford Nab, R.N. [address] [place], [place] [address]  Jake Bathe, MD Fax: [phone]  [name], M.D. Fax: [phone]  Electronically Signed by Antionette Char M.D. on 10/04/2011 09:30:54 PM

## 2011-11-03 ENCOUNTER — Encounter: Payer: Self-pay | Admitting: Gynecology

## 2011-11-03 ENCOUNTER — Ambulatory Visit: Payer: Medicare Other | Attending: Gynecology | Admitting: Gynecology

## 2011-11-03 VITALS — BP 132/60 | HR 68 | Temp 97.7°F | Resp 16 | Ht 64.0 in | Wt 156.7 lb

## 2011-11-03 DIAGNOSIS — E119 Type 2 diabetes mellitus without complications: Secondary | ICD-10-CM | POA: Insufficient documentation

## 2011-11-03 DIAGNOSIS — E669 Obesity, unspecified: Secondary | ICD-10-CM | POA: Insufficient documentation

## 2011-11-03 DIAGNOSIS — Z794 Long term (current) use of insulin: Secondary | ICD-10-CM | POA: Insufficient documentation

## 2011-11-03 DIAGNOSIS — I10 Essential (primary) hypertension: Secondary | ICD-10-CM | POA: Insufficient documentation

## 2011-11-03 DIAGNOSIS — Z79899 Other long term (current) drug therapy: Secondary | ICD-10-CM | POA: Insufficient documentation

## 2011-11-03 DIAGNOSIS — E785 Hyperlipidemia, unspecified: Secondary | ICD-10-CM | POA: Insufficient documentation

## 2011-11-03 DIAGNOSIS — E039 Hypothyroidism, unspecified: Secondary | ICD-10-CM | POA: Insufficient documentation

## 2011-11-03 DIAGNOSIS — C519 Malignant neoplasm of vulva, unspecified: Secondary | ICD-10-CM | POA: Insufficient documentation

## 2011-11-03 DIAGNOSIS — Z87891 Personal history of nicotine dependence: Secondary | ICD-10-CM | POA: Insufficient documentation

## 2011-11-03 HISTORY — DX: Malignant neoplasm of vulva, unspecified: C51.9

## 2011-11-03 NOTE — Progress Notes (Signed)
Consult Note: Gyn-Onc  [name] 77 y.o. female  CC:  Chief Complaint  Patient presents with  . Follow-up    Vulvar cancer    HPI: The patient had vulvar carcinoma initially diagnosed in September 2007. At that time she underwent a modified radical vulvectomy and right inguinal lymphadenectomy. Lymph nodes and surgical margins were negative. She subsequently developed a recurrence on the left vulva in October 2008 underwent left modified radical vulvectomy. Given her age and overall poor performance status we did not perform an inguinal lymphadenectomy.  The patient subsequently had a third recurrence of October 2009 and this was excised and the rhomboid flap was used to fill the surgical defect. Surgical margins were again negative. Her most recent recurrences in September 2012 which is again excised.  She had a superficial wound separation which has been closing by secondary intent.  Interval History: Since her last visit the patient was done well. She continues to use Silvadene cream on the open wound. Otherwise she has no other symptoms and denies any significant pain or bleeding. She has no GI or GU symptoms her functional status is good.  Review of System Ten point review of systems is negative except as noted above  Current Meds:  Outpatient Encounter Prescriptions as of 11/03/2011  Medication Sig Dispense Refill  . alendronate (FOSAMAX) 70 MG tablet Take 70 mg by mouth every 7 (seven) days. Take with a full glass of water on an empty stomach.       [name] atorvastatin (LIPITOR) 20 MG tablet Take 20 mg by mouth daily.        [name] diltiazem (CARDIZEM CD) 240 MG 24 hr capsule Take 240 mg by mouth daily.        [name] doxazosin (CARDURA) 2 MG tablet Take 2 mg by mouth at bedtime.        . indapamide (LOZOL) 1.25 MG tablet Take 1.25 mg by mouth every morning.        . insulin aspart (NOVOLOG) 100 UNIT/ML injection Inject into the skin 3 (three) times daily before meals. 12 units in the am, 16 at  lunch, and 26 at bedtime       . insulin detemir (LEVEMIR) 100 UNIT/ML injection Inject into the skin 2 (two) times daily. 50 units at breakfast, 12 in the evening       . levothyroxine (SYNTHROID, LEVOTHROID) 100 MCG tablet Take 100 mcg by mouth daily.        . valsartan (DIOVAN) 320 MG tablet Take 320 mg by mouth daily.        . Vitamin D, Ergocalciferol, (DRISDOL) 50000 UNITS CAPS Take 50,000 Units by mouth every 7 (seven) days.          Allergy: No Known Allergies  Social Hx:   History   Social History  . Marital Status: Married    Spouse Name: N/A    Number of Children: N/A  . Years of Education: N/A   Occupational History  . Not on file.   Social History Main Topics  . Smoking status: Former Smoker    Quit date: 11/02/1981  . Smokeless tobacco: Not on file  . Alcohol Use: No  . Drug Use: No  . Sexually Active: Not Currently   Other Topics Concern  . Not on file   Social History Narrative  . No narrative on file    Past Surgical Hx:  Past Surgical History  Procedure Date  . Vulvar lesion removal 2012  .  Breast surgery     Lumpectomy  . Dilation and curettage of uterus   . Cystourethroplasty / ureteroneocystostomy   . Radical vulvectomy   . Lymphadenectomy 2007    R inguinal femoral    Past Medical Hx:  Past Medical History  Diagnosis Date  . Diabetes mellitus   . Hypertension   . Hyperlipidemia   . Obesity   . Hypothyroidism   . Vulvar cancer, carcinoma 11/03/2011    Family Hx: History reviewed. No pertinent family history.  Vitals:  Blood pressure 132/60, pulse 68, temperature 97.7 F (36.5 C), temperature source Oral, resp. rate 16, height 5\' 4"  (1.626 m), weight 156 lb 11.2 oz (71.079 kg).  Physical Exam: HEENT is negative neck is supple without thyromegaly  There is no supraclavicular or inguinal adenopathy.  The abdomen is soft and nontender no masses organomegaly ascites or hernias are noted  Pelvic exam: EGBUS is status post modified  radical vulvectomy. The area which was healing by secondary intention has completely healed. No new lesions are noted. The introitus is very stenotic.  Assessment/Plan: Recurrent vulvar carcinoma. The patient's wound is completely healed and there is no evidence of any new lesions.  The patient return to see me in 3 months for continued followup   [name],Keshav Winegar L, MD 11/03/2011, 9:04 AM

## 2011-11-03 NOTE — Patient Instructions (Signed)
Return in 3 months for continued followup

## 2011-11-08 NOTE — Telephone Encounter (Signed)
Opened in error

## 2012-02-05 ENCOUNTER — Encounter: Payer: Self-pay | Admitting: Gynecology

## 2012-02-05 ENCOUNTER — Ambulatory Visit: Payer: Medicare Other | Attending: Gynecology | Admitting: Gynecology

## 2012-02-05 VITALS — BP 134/60 | HR 64 | Temp 97.4°F | Resp 14 | Ht 63.39 in | Wt 155.3 lb

## 2012-02-05 DIAGNOSIS — E785 Hyperlipidemia, unspecified: Secondary | ICD-10-CM | POA: Insufficient documentation

## 2012-02-05 DIAGNOSIS — R32 Unspecified urinary incontinence: Secondary | ICD-10-CM | POA: Insufficient documentation

## 2012-02-05 DIAGNOSIS — I10 Essential (primary) hypertension: Secondary | ICD-10-CM | POA: Insufficient documentation

## 2012-02-05 DIAGNOSIS — E669 Obesity, unspecified: Secondary | ICD-10-CM | POA: Insufficient documentation

## 2012-02-05 DIAGNOSIS — Z79899 Other long term (current) drug therapy: Secondary | ICD-10-CM | POA: Insufficient documentation

## 2012-02-05 DIAGNOSIS — E039 Hypothyroidism, unspecified: Secondary | ICD-10-CM | POA: Insufficient documentation

## 2012-02-05 DIAGNOSIS — Z87891 Personal history of nicotine dependence: Secondary | ICD-10-CM | POA: Insufficient documentation

## 2012-02-05 DIAGNOSIS — E119 Type 2 diabetes mellitus without complications: Secondary | ICD-10-CM | POA: Insufficient documentation

## 2012-02-05 DIAGNOSIS — C519 Malignant neoplasm of vulva, unspecified: Secondary | ICD-10-CM | POA: Insufficient documentation

## 2012-02-05 NOTE — Patient Instructions (Signed)
We will call you with the results of the biopsy. A prescription for Vicodin every 6 hours when necessary for pain was given.

## 2012-02-05 NOTE — Progress Notes (Signed)
Consult Note: Gyn-Onc   [name] 78 y.o. female  Chief Complaint  Patient presents with  . Vulvar Cancer    Follow up    Interval History: The patient returns today as previously scheduled. In the interval since her last visit she has done well. She denies any vulvar symptoms. She does have urinary incontinence and wears a pad. She has no other GI symptoms.  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 At that time she underwent a modified radical vulvectomy and right inguinal lymphadenectomy. Lymph nodes and surgical margins were negative. She subsequently developed a recurrence on the left vulva in October 2008 underwent left modified radical vulvectomy. Given her age and overall poor performance status we did not perform an inguinal lymphadenectomy.  The patient subsequently had a third recurrence of October 2009 and this was excised and the rhomboid flap was used to fill the surgical defect. Surgical margins were again negative. Her most recent recurrences in September 2012 which is again excised.  She had a superficial wound separation which has been closing by secondary intent.  Interval History: Since her last visit the patient was done well. She continues to use Silvadene cream on the open wound. Otherwise she has no other symptoms and denies any significant pain or bleeding. She has no GI or GU symptoms her functional status is good.   Review of System Ten point review of systems is negative except as noted above      No Known Allergies  Past Medical History  Diagnosis Date  . Diabetes mellitus   . Hypertension   . Hyperlipidemia   . Obesity   . Hypothyroidism   . Vulvar cancer, carcinoma 11/03/2011    Past Surgical History  Procedure Date  . Vulvar lesion removal 2012  . Breast surgery     Lumpectomy  . Dilation and curettage of uterus   . Cystourethroplasty / ureteroneocystostomy   . Radical vulvectomy   . Lymphadenectomy  2007    R inguinal femoral    Current Outpatient Prescriptions  Medication Sig Dispense Refill  . alendronate (FOSAMAX) 70 MG tablet Take 70 mg by mouth every 7 (seven) days. Take with a full glass of water on an empty stomach.       . atorvastatin (LIPITOR) 20 MG tablet Take 20 mg by mouth daily.        . diltiazem (CARDIZEM CD) 240 MG 24 hr capsule Take 240 mg by mouth daily.        . doxazosin (CARDURA) 2 MG tablet Take 2 mg by mouth at bedtime.        . indapamide (LOZOL) 1.25 MG tablet Take 1.25 mg by mouth every morning.        . insulin aspart (NOVOLOG) 100 UNIT/ML injection Inject into the skin 3 (three) times daily before meals. 16 units in the am, 14 at lunch, and 32 at bedtime      . insulin detemir (LEVEMIR) 100 UNIT/ML injection Inject into the skin 2 (two) times daily. 32 units at breakfast, 16 in the evening      . levothyroxine (SYNTHROID, LEVOTHROID) 100 MCG tablet Take 100 mcg by mouth daily.        . valsartan (DIOVAN) 320 MG tablet Take 320 mg by mouth daily.        . Vitamin D, Ergocalciferol, (DRISDOL) 50000 UNITS CAPS Take 50,000 Units by mouth every 7 (seven) days.            History   Social History  . Marital Status: Married    Spouse Name: N/A    Number of Children: N/A  . Years of Education: N/A   Occupational History  . Not on file.   Social History Main Topics  . Smoking status: Former Smoker    Quit date: 11/02/1981  . Smokeless tobacco: Not on file  . Alcohol Use: No  . Drug Use: No  . Sexually Active: Not Currently   Other Topics Concern  . Not on file   Social History Narrative  . No narrative on file    No family history on file.     Vitals: Blood pressure 134/60, pulse 64, temperature 97.4 F (36.3 C), resp. rate 14, height 5' 3.39" (1.61 m), weight 155 lb 4.8 oz (70.444 kg).  Physical Exam: In general the patient is a pleasant elderly white female no acute distress.  HEENT is negative. Neck is supple without thyromegaly.  There  is no supraclavicular or inguinal adenopathy.  The abdomen is soft nontender no masses organomegaly or ascites is noted.  Pelvic exam EGBUS is status post radical vulvectomy with advancement flaps. On the left periurethral area is a 2 cm raised granular lesion which was not present on last exam.  Procedure note:  After 1% Xylocaine was injected a punch biopsy of the left vulvar lesion is obtained. Hemostasis is achieved with silver nitrate.  Assessment/Plan: Probable recurrent vulvar cancer. We will await biopsy return. If the patient does have recurrent vulvar cancer and then a repeat excision will be necessary.   [name],Conception Doebler L, MD 02/05/2012, 9:49 AM                         Consult Note: Gyn-Onc   [name] 78 y.o. female  Chief Complaint  Patient presents with  . Vulvar Cancer    Follow up    Interval History:   HPI:  No Known Allergies  Past Medical History  Diagnosis Date  . Diabetes mellitus   . Hypertension   . Hyperlipidemia   . Obesity   . Hypothyroidism   . Vulvar cancer, carcinoma 11/03/2011    Past Surgical History  Procedure Date  . Vulvar lesion removal 2012  . Breast surgery     Lumpectomy  . Dilation and curettage of uterus   . Cystourethroplasty / ureteroneocystostomy   . Radical vulvectomy   . Lymphadenectomy 2007    R inguinal femoral    Current Outpatient Prescriptions  Medication Sig Dispense Refill  . alendronate (FOSAMAX) 70 MG tablet Take 70 mg by mouth every 7 (seven) days. Take with a full glass of water on an empty stomach.       . atorvastatin (LIPITOR) 20 MG tablet Take 20 mg by mouth daily.        . diltiazem (CARDIZEM CD) 240 MG 24 hr capsule Take 240 mg by mouth daily.        . doxazosin (CARDURA) 2 MG tablet Take 2 mg by mouth at bedtime.        . indapamide (LOZOL) 1.25 MG tablet Take 1.25 mg by mouth every morning.        . insulin aspart (NOVOLOG) 100 UNIT/ML injection Inject into the  skin 3 (three) times daily before meals. 16 units in the am, 14 at lunch, and 32 at bedtime      . insulin detemir (LEVEMIR) 100 UNIT/ML injection Inject into the skin 2 (two) times   daily. 32 units at breakfast, 16 in the evening      . levothyroxine (SYNTHROID, LEVOTHROID) 100 MCG tablet Take 100 mcg by mouth daily.        . valsartan (DIOVAN) 320 MG tablet Take 320 mg by mouth daily.        . Vitamin D, Ergocalciferol, (DRISDOL) 50000 UNITS CAPS Take 50,000 Units by mouth every 7 (seven) days.          History   Social History  . Marital Status: Married    Spouse Name: N/A    Number of Children: N/A  . Years of Education: N/A   Occupational History  . Not on file.   Social History Main Topics  . Smoking status: Former Smoker    Quit date: 11/02/1981  . Smokeless tobacco: Not on file  . Alcohol Use: No  . Drug Use: No  . Sexually Active: Not Currently   Other Topics Concern  . Not on file   Social History Narrative  . No narrative on file    No family history on file.  Review of Systems:  Vitals: Blood pressure 134/60, pulse 64, temperature 97.4 F (36.3 C), resp. rate 14, height 5' 3.39" (1.61 m), weight 155 lb 4.8 oz (70.444 kg).  Physical Exam:  Assessment/Plan:   [name],Daleyssa Loiselle L, MD 02/05/2012, 9:49 AM                         

## 2012-02-12 ENCOUNTER — Telehealth: Payer: Self-pay | Admitting: *Deleted

## 2012-02-12 NOTE — Telephone Encounter (Signed)
Patient notified of Path results.  Pt scheduled for excision 02/27/12

## 2012-02-19 ENCOUNTER — Encounter (HOSPITAL_COMMUNITY): Payer: Self-pay | Admitting: Pharmacy Technician

## 2012-02-23 ENCOUNTER — Other Ambulatory Visit: Payer: Self-pay

## 2012-02-23 ENCOUNTER — Encounter (HOSPITAL_COMMUNITY)
Admission: RE | Admit: 2012-02-23 | Discharge: 2012-02-23 | Disposition: A | Discharge status: Home / Self Care | Payer: Medicare Other | Source: Ambulatory Visit | Attending: Gynecology | Admitting: Gynecology

## 2012-02-23 ENCOUNTER — Encounter (HOSPITAL_COMMUNITY): Payer: Self-pay

## 2012-02-23 ENCOUNTER — Ambulatory Visit (HOSPITAL_COMMUNITY)
Admission: RE | Admit: 2012-02-23 | Discharge: 2012-02-23 | Disposition: A | Discharge status: Home / Self Care | Payer: Medicare Other | Source: Ambulatory Visit | Attending: Gynecology | Admitting: Gynecology

## 2012-02-23 DIAGNOSIS — Z01812 Encounter for preprocedural laboratory examination: Secondary | ICD-10-CM | POA: Insufficient documentation

## 2012-02-23 DIAGNOSIS — N909 Noninflammatory disorder of vulva and perineum, unspecified: Secondary | ICD-10-CM | POA: Insufficient documentation

## 2012-02-23 DIAGNOSIS — Z87891 Personal history of nicotine dependence: Secondary | ICD-10-CM | POA: Insufficient documentation

## 2012-02-23 DIAGNOSIS — I10 Essential (primary) hypertension: Secondary | ICD-10-CM | POA: Insufficient documentation

## 2012-02-23 DIAGNOSIS — Z01818 Encounter for other preprocedural examination: Secondary | ICD-10-CM | POA: Insufficient documentation

## 2012-02-23 HISTORY — DX: Cerebral infarction, unspecified: I63.9

## 2012-02-23 LAB — DIFFERENTIAL
Lymphocytes Relative: 16 % (ref 12–46)
Monocytes Absolute: 0.5 10*3/uL (ref 0.1–1.0)
Monocytes Relative: 7 % (ref 3–12)
Neutro Abs: 5.5 10*3/uL (ref 1.7–7.7)

## 2012-02-23 LAB — COMPREHENSIVE METABOLIC PANEL
AST: 14 U/L (ref 0–37)
Albumin: 3.4 g/dL — ABNORMAL LOW (ref 3.5–5.2)
Calcium: 9.6 mg/dL (ref 8.4–10.5)
Chloride: 102 mEq/L (ref 96–112)
Creatinine, Ser: 1.56 mg/dL — ABNORMAL HIGH (ref 0.50–1.10)

## 2012-02-23 LAB — CBC
MCH: 32.3 pg (ref 26.0–34.0)
MCV: 94.2 fL (ref 78.0–100.0)
Platelets: 155 10*3/uL (ref 150–400)
RBC: 3.99 MIL/uL (ref 3.87–5.11)
RDW: 12.4 % (ref 11.5–15.5)
WBC: 7.4 10*3/uL (ref 4.0–10.5)

## 2012-02-23 LAB — SURGICAL PCR SCREEN: MRSA, PCR: NEGATIVE

## 2012-02-23 NOTE — Pre-Procedure Instructions (Signed)
Talked with Dr. Rica Mast, Anesthesiologist and showed him EKG- his recommendation was to let Dr. Anne Fu know of this and something needs to be done before her surgery since this is a new onset since 08/2011. Dagmar Hait ,RN notified of recommendation from Dr. Rica Mast. Dr. Anne Fu office called and talked to Richardo Hanks with the results of her EKG (faxed to Dr. Anne Fu at [phone] talk to Dr. Anne Fu and call me back.

## 2012-02-23 NOTE — Patient Instructions (Addendum)
22 [name]  02/23/2012   Your procedure is scheduled on: Tuesday 02/27/2012 at 0715   Report to [facility] at 0515 AM.  Call this number if you have problems the morning of surgery: [phone]   Remember:Do not take any Diabetic medications morning of surgery ! Take 1/2 dose of Levemir and Novolog NIGHT BEFORE SURGERY!   Do not eat food or drink liquids after midnight!  Take these medicines the morning of surgery with A SIP OF WATER: Cardiazem,Synthroid   Do not wear jewelry.  Do not wear lotions, powders, or perfume. Do not  wear deodorant  Do not bring valuables to the hospital.  Contacts, dentures or bridgework may not be worn into surgery.  Leave suitcase in the car. After surgery it may be brought to your room.  For patients admitted to the hospital, checkout time is 11:00 AM the day of              Discharge.    Name and phone number of your driver:   Special Instructions: CHG Shower Use Special Wash: 1/2 bottle night              Before surgery and 1/2 bottle morning of surgery-use regular soap on face             And front and back private area.   Please read over the following fact sheets that you were given: MRSA , Incentive Spirometry             INFORMATION              Telford Nab.Sarinah Doetsch,RN,BSN [phone]

## 2012-02-23 NOTE — Pre-Procedure Instructions (Signed)
Pt.'s EKG shows A-Fibrillation. Talked to Lind Covert who wants nurse to show Anesthesia the EKG and see their recommendation.

## 2012-02-23 NOTE — Pre-Procedure Instructions (Signed)
09/07/2011 EKG from [facility] on chart,09/13/2011 Echo from [facility] on chart,08/01/2011 office note from Dr. [name] on chart,09/19/2011,09/20/2011 chest x-ray from [facility] on chart, 09/13/2011 Stress test from [facility] on chart 09/13/2011 office visit from Dr. Anne Fu on chart

## 2012-02-26 NOTE — Pre-Procedure Instructions (Signed)
[name] came by at 0900am for update on pt. Cardiac status-Dr. Anne Fu office had called to let us know he was to see pt. 02/26/2012 at 0830am. [name] given abnormal lab info.

## 2012-02-26 NOTE — Pre-Procedure Instructions (Signed)
Received office note for 02/26/2012(-surgery clearance in note and suggestion) from Dr. Anne Fu office via Julien Nordmann  And placed in chart.

## 2012-02-26 NOTE — Anesthesia Preprocedure Evaluation (Addendum)
Anesthesia Evaluation  Patient identified by MRN, date of birth, ID band Patient awake    Reviewed: Allergy & Precautions, H&P , NPO status , Patient's Chart, lab work & pertinent test results  Airway Mallampati: II TM Distance: >3 FB Neck ROM: full    Dental  (+) Upper Dentures and Lower Dentures   Pulmonary neg pulmonary ROS,  breath sounds clear to auscultation  Pulmonary exam normal       Cardiovascular Exercise Tolerance: Good hypertension, Pt. on medications negative cardio ROS  + dysrhythmias Atrial Fibrillation Rhythm:Irregular Rate:Normal     Neuro/Psych CVA, No Residual Symptoms negative neurological ROS  negative psych ROS   GI/Hepatic negative GI ROS, Neg liver ROS,   Endo/Other  negative endocrine ROSDiabetes mellitus-, Well Controlled, Type 2, Insulin DependentHypothyroidism   Renal/GU negative Renal ROS  negative genitourinary   Musculoskeletal   Abdominal   Peds  Hematology negative hematology ROS (+)   Anesthesia Other Findings   Reproductive/Obstetrics negative OB ROS                         Anesthesia Physical Anesthesia Plan  ASA: III  Anesthesia Plan: General   Post-op Pain Management:    Induction: Intravenous  Airway Management Planned: LMA  Additional Equipment:   Intra-op Plan:   Post-operative Plan:   Informed Consent: I have reviewed the patients History and Physical, chart, labs and discussed the procedure including the risks, benefits and alternatives for the proposed anesthesia with the patient or authorized representative who has indicated his/her understanding and acceptance.   Dental Advisory Given  Plan Discussed with: CRNA and Surgeon  Anesthesia Plan Comments:       Anesthesia Quick Evaluation

## 2012-02-27 ENCOUNTER — Ambulatory Visit (HOSPITAL_COMMUNITY): Payer: Medicare Other | Admitting: Anesthesiology

## 2012-02-27 ENCOUNTER — Encounter (HOSPITAL_COMMUNITY): Payer: Self-pay | Admitting: Anesthesiology

## 2012-02-27 ENCOUNTER — Inpatient Hospital Stay (HOSPITAL_COMMUNITY)
Admission: RE | Admit: 2012-02-27 | Discharge: 2012-03-01 | DRG: 735 | Disposition: A | Discharge status: Home / Self Care | Payer: Medicare Other | Source: Ambulatory Visit | Attending: Obstetrics & Gynecology | Admitting: Obstetrics & Gynecology

## 2012-02-27 ENCOUNTER — Encounter (HOSPITAL_COMMUNITY)
Admission: RE | Disposition: A | Discharge status: Home / Self Care | Payer: Self-pay | Source: Ambulatory Visit | Attending: Obstetrics & Gynecology

## 2012-02-27 ENCOUNTER — Encounter (HOSPITAL_COMMUNITY): Payer: Self-pay | Admitting: *Deleted

## 2012-02-27 DIAGNOSIS — I10 Essential (primary) hypertension: Secondary | ICD-10-CM | POA: Diagnosis present

## 2012-02-27 DIAGNOSIS — E875 Hyperkalemia: Secondary | ICD-10-CM | POA: Diagnosis present

## 2012-02-27 DIAGNOSIS — Z8673 Personal history of transient ischemic attack (TIA), and cerebral infarction without residual deficits: Secondary | ICD-10-CM

## 2012-02-27 DIAGNOSIS — E119 Type 2 diabetes mellitus without complications: Secondary | ICD-10-CM | POA: Diagnosis present

## 2012-02-27 DIAGNOSIS — C519 Malignant neoplasm of vulva, unspecified: Principal | ICD-10-CM | POA: Diagnosis present

## 2012-02-27 HISTORY — PX: RADICAL VULVECTOMY: SHX2286

## 2012-02-27 HISTORY — PX: VULVAR LESION REMOVAL: SHX5391

## 2012-02-27 LAB — GLUCOSE, CAPILLARY
Glucose-Capillary: 140 mg/dL — ABNORMAL HIGH (ref 70–99)
Glucose-Capillary: 313 mg/dL — ABNORMAL HIGH (ref 70–99)

## 2012-02-27 SURGERY — VULVAR LESION
Anesthesia: General | Wound class: Clean Contaminated

## 2012-02-27 MED ORDER — ACETIC ACID 5 % SOLN
Status: DC | PRN
  Administered 2012-02-27: 1 via TOPICAL

## 2012-02-27 MED ORDER — DILTIAZEM HCL ER COATED BEADS 240 MG PO CP24
240.0000 mg | ORAL_CAPSULE | Freq: Every day | ORAL | Status: DC
Start: 2012-02-28 — End: 2012-03-01
  Administered 2012-02-28 – 2012-02-29 (×2): 240 mg via ORAL
  Filled 2012-02-27 (×3): qty 1

## 2012-02-27 MED ORDER — INSULIN ASPART 100 UNIT/ML ~~LOC~~ SOLN
14.0000 [IU] | Freq: Every day | SUBCUTANEOUS | Status: DC
  Administered 2012-02-28 – 2012-02-29 (×2): 14 [IU] via SUBCUTANEOUS
  Filled 2012-02-27: qty 3

## 2012-02-27 MED ORDER — ACETAMINOPHEN 10 MG/ML IV SOLN
INTRAVENOUS | Status: DC | PRN
  Administered 2012-02-27: 1000 mg via INTRAVENOUS

## 2012-02-27 MED ORDER — EPHEDRINE SULFATE 50 MG/ML IJ SOLN
INTRAMUSCULAR | Status: DC | PRN
  Administered 2012-02-27 (×2): 2.5 mg via INTRAVENOUS

## 2012-02-27 MED ORDER — DOXAZOSIN MESYLATE 1 MG PO TABS
1.0000 mg | ORAL_TABLET | Freq: Every day | ORAL | Status: DC
  Administered 2012-02-27 – 2012-02-29 (×3): 1 mg via ORAL
  Filled 2012-02-27 (×4): qty 1

## 2012-02-27 MED ORDER — SILVER SULFADIAZINE 1 % EX CREA
TOPICAL_CREAM | CUTANEOUS | Status: AC
  Filled 2012-02-27: qty 85

## 2012-02-27 MED ORDER — NON FORMULARY: 320.0000 | Freq: Every morning | Status: DC

## 2012-02-27 MED ORDER — DIPHENHYDRAMINE HCL 50 MG/ML IJ SOLN: 12.5000 mg | Freq: Four times a day (QID) | INTRAMUSCULAR | Status: DC | PRN

## 2012-02-27 MED ORDER — FENTANYL CITRATE 0.05 MG/ML IJ SOLN
INTRAMUSCULAR | Status: DC | PRN
  Administered 2012-02-27: 25 ug via INTRAVENOUS
  Administered 2012-02-27: 50 ug via INTRAVENOUS
  Administered 2012-02-27: 25 ug via INTRAVENOUS

## 2012-02-27 MED ORDER — IRBESARTAN 300 MG PO TABS
300.0000 mg | ORAL_TABLET | Freq: Every day | ORAL | Status: DC
  Filled 2012-02-27: qty 1

## 2012-02-27 MED ORDER — 0.9 % SODIUM CHLORIDE (POUR BTL) OPTIME
TOPICAL | Status: DC | PRN
  Administered 2012-02-27: 1000 mL

## 2012-02-27 MED ORDER — INSULIN DETEMIR 100 UNIT/ML ~~LOC~~ SOLN
32.0000 [IU] | Freq: Two times a day (BID) | SUBCUTANEOUS | Status: DC
  Administered 2012-02-27 – 2012-02-29 (×5): 32 [IU] via SUBCUTANEOUS
  Filled 2012-02-27: qty 3

## 2012-02-27 MED ORDER — DIPHENHYDRAMINE HCL 12.5 MG/5ML PO ELIX
12.5000 mg | ORAL_SOLUTION | Freq: Four times a day (QID) | ORAL | Status: DC | PRN
  Filled 2012-02-27: qty 5

## 2012-02-27 MED ORDER — PROPOFOL 10 MG/ML IV BOLUS
INTRAVENOUS | Status: DC | PRN
  Administered 2012-02-27: 20 mg via INTRAVENOUS
  Administered 2012-02-27: 120 mg via INTRAVENOUS

## 2012-02-27 MED ORDER — ONDANSETRON HCL 4 MG/2ML IJ SOLN
INTRAMUSCULAR | Status: DC | PRN
  Administered 2012-02-27: 4 mg via INTRAVENOUS

## 2012-02-27 MED ORDER — INSULIN ASPART 100 UNIT/ML ~~LOC~~ SOLN: 14.0000 [IU] | Freq: Three times a day (TID) | SUBCUTANEOUS | Status: DC

## 2012-02-27 MED ORDER — LEVOTHYROXINE SODIUM 100 MCG PO TABS
100.0000 ug | ORAL_TABLET | Freq: Every day | ORAL | Status: DC
  Administered 2012-02-28 – 2012-03-01 (×3): 100 ug via ORAL
  Filled 2012-02-27 (×3): qty 1

## 2012-02-27 MED ORDER — FENTANYL CITRATE 0.05 MG/ML IJ SOLN
25.0000 ug | INTRAMUSCULAR | Status: DC | PRN
  Administered 2012-02-27 (×4): 25 ug via INTRAVENOUS

## 2012-02-27 MED ORDER — ACETIC ACID 5 % SOLN
Status: AC
  Filled 2012-02-27: qty 500

## 2012-02-27 MED ORDER — NALOXONE HCL 0.4 MG/ML IJ SOLN: 0.4000 mg | INTRAMUSCULAR | Status: DC | PRN

## 2012-02-27 MED ORDER — LACTATED RINGERS IV SOLN: INTRAVENOUS | Status: DC

## 2012-02-27 MED ORDER — HYDROMORPHONE 0.3 MG/ML IV SOLN
INTRAVENOUS | Status: AC
  Filled 2012-02-27: qty 25

## 2012-02-27 MED ORDER — INSULIN ASPART 100 UNIT/ML ~~LOC~~ SOLN
16.0000 [IU] | Freq: Every day | SUBCUTANEOUS | Status: DC
  Administered 2012-02-28 – 2012-03-01 (×3): 16 [IU] via SUBCUTANEOUS
  Filled 2012-02-27: qty 3

## 2012-02-27 MED ORDER — ONDANSETRON HCL 4 MG/2ML IJ SOLN: 4.0000 mg | Freq: Four times a day (QID) | INTRAMUSCULAR | Status: DC | PRN

## 2012-02-27 MED ORDER — SILVER SULFADIAZINE 1 % EX CREA
TOPICAL_CREAM | CUTANEOUS | Status: DC | PRN
  Administered 2012-02-27: 1 via TOPICAL

## 2012-02-27 MED ORDER — DEXTROSE 5 % IV SOLN
1.0000 g | INTRAVENOUS | Status: AC
  Administered 2012-02-27: 1 g via INTRAVENOUS
  Filled 2012-02-27: qty 1

## 2012-02-27 MED ORDER — ACETAMINOPHEN 10 MG/ML IV SOLN
INTRAVENOUS | Status: AC
  Filled 2012-02-27: qty 100

## 2012-02-27 MED ORDER — DOCUSATE SODIUM 100 MG PO CAPS
100.0000 mg | ORAL_CAPSULE | Freq: Two times a day (BID) | ORAL | Status: DC
  Administered 2012-02-27 – 2012-02-29 (×5): 100 mg via ORAL
  Filled 2012-02-27 (×9): qty 1

## 2012-02-27 MED ORDER — HYDROMORPHONE 0.3 MG/ML IV SOLN
INTRAVENOUS | Status: DC
  Administered 2012-02-27: 10:00:00 via INTRAVENOUS
  Administered 2012-02-27: 0.2 mg via INTRAVENOUS
  Administered 2012-02-27: 0.599 mg via INTRAVENOUS
  Administered 2012-02-28: 0.9 mg via INTRAVENOUS

## 2012-02-27 MED ORDER — FENTANYL CITRATE 0.05 MG/ML IJ SOLN
INTRAMUSCULAR | Status: AC
  Filled 2012-02-27: qty 2

## 2012-02-27 MED ORDER — KCL IN DEXTROSE-NACL 20-5-0.45 MEQ/L-%-% IV SOLN
INTRAVENOUS | Status: DC
  Administered 2012-02-27 – 2012-02-28 (×2): via INTRAVENOUS
  Filled 2012-02-27 (×3): qty 1000

## 2012-02-27 MED ORDER — LACTATED RINGERS IV SOLN
INTRAVENOUS | Status: DC
  Administered 2012-02-27: 07:00:00 via INTRAVENOUS

## 2012-02-27 MED ORDER — ENOXAPARIN SODIUM 40 MG/0.4ML ~~LOC~~ SOLN
SUBCUTANEOUS | Status: AC
  Filled 2012-02-27: qty 0.4

## 2012-02-27 MED ORDER — ENOXAPARIN SODIUM 40 MG/0.4ML ~~LOC~~ SOLN
40.0000 mg | SUBCUTANEOUS | Status: AC
  Administered 2012-02-27: 40 mg via SUBCUTANEOUS

## 2012-02-27 MED ORDER — SODIUM CHLORIDE 0.9 % IJ SOLN: 9.0000 mL | INTRAMUSCULAR | Status: DC | PRN

## 2012-02-27 MED ORDER — INDAPAMIDE 1.25 MG PO TABS
1.2500 mg | ORAL_TABLET | Freq: Every day | ORAL | Status: DC
  Administered 2012-02-27 – 2012-02-29 (×3): 1.25 mg via ORAL
  Filled 2012-02-27 (×4): qty 1

## 2012-02-27 MED ORDER — INSULIN ASPART 100 UNIT/ML ~~LOC~~ SOLN
32.0000 [IU] | Freq: Every day | SUBCUTANEOUS | Status: DC
  Administered 2012-02-27: 32 [IU] via SUBCUTANEOUS
  Filled 2012-02-27: qty 3

## 2012-02-27 SURGICAL SUPPLY — 30 items
BANDAGE GAUZE ELAST BULKY 4 IN (GAUZE/BANDAGES/DRESSINGS) ×2 IMPLANT
BLADE SURG 15 STRL LF DISP TIS (BLADE) ×2 IMPLANT
BLADE SURG 15 STRL SS (BLADE) ×2
CANISTER SUCTION 2500CC (MISCELLANEOUS) ×2 IMPLANT
CATH FOLEY 2WAY  5CC 16FR SIL (CATHETERS) ×1
CATH FOLEY 2WAY 5CC 16FR SIL (CATHETERS) ×1 IMPLANT
CATH ROBINSON RED A/P 16FR (CATHETERS) ×2 IMPLANT
CLOTH BEACON ORANGE TIMEOUT ST (SAFETY) ×2 IMPLANT
COVER MAYO STAND STRL (DRAPES) ×2 IMPLANT
COVER SURGICAL LIGHT HANDLE (MISCELLANEOUS) ×2 IMPLANT
DRAPE LG THREE QUARTER DISP (DRAPES) ×4 IMPLANT
GLOVE BIO SURGEON STRL SZ7.5 (GLOVE) ×4 IMPLANT
GOWN STRL REIN XL XLG (GOWN DISPOSABLE) ×4 IMPLANT
NS IRRIG 1000ML POUR BTL (IV SOLUTION) ×2 IMPLANT
PACK COOL COMFORT PERI COLD (SET/KITS/TRAYS/PACK) IMPLANT
PACK MINOR VAGINAL W LONG (CUSTOM PROCEDURE TRAY) ×2 IMPLANT
PENCIL BUTTON HOLSTER BLD 10FT (ELECTRODE) IMPLANT
SPONGE LAP 18X18 X RAY DECT (DISPOSABLE) ×4 IMPLANT
SUT VIC AB 0 CT1 36 (SUTURE) ×2 IMPLANT
SUT VIC AB 2-0 CT2 27 (SUTURE) ×16 IMPLANT
SUT VIC AB 2-0 SH 27 (SUTURE)
SUT VIC AB 2-0 SH 27X BRD (SUTURE) IMPLANT
SUT VIC AB 3-0 PS2 18 (SUTURE) ×4
SUT VIC AB 3-0 PS2 18XBRD (SUTURE) ×2 IMPLANT
SUT VICRYL RAPIDE 3 0 (SUTURE) IMPLANT
TOWEL OR 17X26 10 PK STRL BLUE (TOWEL DISPOSABLE) ×2 IMPLANT
TOWEL OR NON WOVEN STRL DISP B (DISPOSABLE) ×2 IMPLANT
TRAY FOLEY CATH 14FRSI W/METER (CATHETERS) ×2 IMPLANT
WATER STERILE IRR 1500ML POUR (IV SOLUTION) ×2 IMPLANT
YANKAUER SUCT BULB TIP 10FT TU (MISCELLANEOUS) ×2 IMPLANT

## 2012-02-27 NOTE — Transfer of Care (Signed)
Immediate Anesthesia Transfer of Care Note  Patient: [name]  Procedure(s) Performed: Procedure(s) (LRB): VULVAR LESION (N/A)  Patient Location: PACU  Anesthesia Type: General  Level of Consciousness: awake, alert  and patient cooperative  Airway & Oxygen Therapy: Patient Spontanous Breathing and Patient connected to face mask oxygen  Post-op Assessment: Report given to PACU RN and Post -op Vital signs reviewed and stable  Post vital signs: Reviewed and stable  Complications: No apparent anesthesia complications

## 2012-02-27 NOTE — H&P (View-Only) (Signed)
Consult Note: Gyn-Onc   [name] 78 y.o. female  Chief Complaint  Patient presents with  . Vulvar Cancer    Follow up    Interval History: The patient returns today as previously scheduled. In the interval since her last visit she has done well. She denies any vulvar symptoms. She does have urinary incontinence and wears a pad. She has no other GI symptoms.  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 At that time she underwent a modified radical vulvectomy and right inguinal lymphadenectomy. Lymph nodes and surgical margins were negative. She subsequently developed a recurrence on the left vulva in October 2008 underwent left modified radical vulvectomy. Given her age and overall poor performance status we did not perform an inguinal lymphadenectomy.  The patient subsequently had a third recurrence of October 2009 and this was excised and the rhomboid flap was used to fill the surgical defect. Surgical margins were again negative. Her most recent recurrences in September 2012 which is again excised.  She had a superficial wound separation which has been closing by secondary intent.  Interval History: Since her last visit the patient was done well. She continues to use Silvadene cream on the open wound. Otherwise she has no other symptoms and denies any significant pain or bleeding. She has no GI or GU symptoms her functional status is good.   Review of System Ten point review of systems is negative except as noted above      No Known Allergies  Past Medical History  Diagnosis Date  . Diabetes mellitus   . Hypertension   . Hyperlipidemia   . Obesity   . Hypothyroidism   . Vulvar cancer, carcinoma 11/03/2011    Past Surgical History  Procedure Date  . Vulvar lesion removal 2012  . Breast surgery     Lumpectomy  . Dilation and curettage of uterus   . Cystourethroplasty / ureteroneocystostomy   . Radical vulvectomy   . Lymphadenectomy  2007    R inguinal femoral    Current Outpatient Prescriptions  Medication Sig Dispense Refill  . alendronate (FOSAMAX) 70 MG tablet Take 70 mg by mouth every 7 (seven) days. Take with a full glass of water on an empty stomach.       [name] atorvastatin (LIPITOR) 20 MG tablet Take 20 mg by mouth daily.        [name] diltiazem (CARDIZEM CD) 240 MG 24 hr capsule Take 240 mg by mouth daily.        [name] doxazosin (CARDURA) 2 MG tablet Take 2 mg by mouth at bedtime.        . indapamide (LOZOL) 1.25 MG tablet Take 1.25 mg by mouth every morning.        . insulin aspart (NOVOLOG) 100 UNIT/ML injection Inject into the skin 3 (three) times daily before meals. 16 units in the am, 14 at lunch, and 32 at bedtime      . insulin detemir (LEVEMIR) 100 UNIT/ML injection Inject into the skin 2 (two) times daily. 32 units at breakfast, 16 in the evening      . levothyroxine (SYNTHROID, LEVOTHROID) 100 MCG tablet Take 100 mcg by mouth daily.        . valsartan (DIOVAN) 320 MG tablet Take 320 mg by mouth daily.        . Vitamin D, Ergocalciferol, (DRISDOL) 50000 UNITS CAPS Take 50,000 Units by mouth every 7 (seven) days.  History   Social History  . Marital Status: Married    Spouse Name: N/A    Number of Children: N/A  . Years of Education: N/A   Occupational History  . Not on file.   Social History Main Topics  . Smoking status: Former Smoker    Quit date: 11/02/1981  . Smokeless tobacco: Not on file  . Alcohol Use: No  . Drug Use: No  . Sexually Active: Not Currently   Other Topics Concern  . Not on file   Social History Narrative  . No narrative on file    No family history on file.     Vitals: Blood pressure 134/60, pulse 64, temperature 97.4 F (36.3 C), resp. rate 14, height 5' 3.39" (1.61 m), weight 155 lb 4.8 oz (70.444 kg).  Physical Exam: In general the patient is a pleasant elderly white female no acute distress.  HEENT is negative. Neck is supple without thyromegaly.  There  is no supraclavicular or inguinal adenopathy.  The abdomen is soft nontender no masses organomegaly or ascites is noted.  Pelvic exam EGBUS is status post radical vulvectomy with advancement flaps. On the left periurethral area is a 2 cm raised granular lesion which was not present on last exam.  Procedure note:  After 1% Xylocaine was injected a punch biopsy of the left vulvar lesion is obtained. Hemostasis is achieved with silver nitrate.  Assessment/Plan: Probable recurrent vulvar cancer. We will await biopsy return. If the patient does have recurrent vulvar cancer and then a repeat excision will be necessary.   Jeannette Corpus, MD 02/05/2012, 9:49 AM                         Consult Note: Gyn-Onc   [name] 78 y.o. female  Chief Complaint  Patient presents with  . Vulvar Cancer    Follow up    Interval History:   HPI:  No Known Allergies  Past Medical History  Diagnosis Date  . Diabetes mellitus   . Hypertension   . Hyperlipidemia   . Obesity   . Hypothyroidism   . Vulvar cancer, carcinoma 11/03/2011    Past Surgical History  Procedure Date  . Vulvar lesion removal 2012  . Breast surgery     Lumpectomy  . Dilation and curettage of uterus   . Cystourethroplasty / ureteroneocystostomy   . Radical vulvectomy   . Lymphadenectomy 2007    R inguinal femoral    Current Outpatient Prescriptions  Medication Sig Dispense Refill  . alendronate (FOSAMAX) 70 MG tablet Take 70 mg by mouth every 7 (seven) days. Take with a full glass of water on an empty stomach.       [name] atorvastatin (LIPITOR) 20 MG tablet Take 20 mg by mouth daily.        [name] diltiazem (CARDIZEM CD) 240 MG 24 hr capsule Take 240 mg by mouth daily.        [name] doxazosin (CARDURA) 2 MG tablet Take 2 mg by mouth at bedtime.        . indapamide (LOZOL) 1.25 MG tablet Take 1.25 mg by mouth every morning.        . insulin aspart (NOVOLOG) 100 UNIT/ML injection Inject into the  skin 3 (three) times daily before meals. 16 units in the am, 14 at lunch, and 32 at bedtime      . insulin detemir (LEVEMIR) 100 UNIT/ML injection Inject into the skin 2 (two) times  daily. 32 units at breakfast, 16 in the evening      . levothyroxine (SYNTHROID, LEVOTHROID) 100 MCG tablet Take 100 mcg by mouth daily.        . valsartan (DIOVAN) 320 MG tablet Take 320 mg by mouth daily.        . Vitamin D, Ergocalciferol, (DRISDOL) 50000 UNITS CAPS Take 50,000 Units by mouth every 7 (seven) days.          History   Social History  . Marital Status: Married    Spouse Name: N/A    Number of Children: N/A  . Years of Education: N/A   Occupational History  . Not on file.   Social History Main Topics  . Smoking status: Former Smoker    Quit date: 11/02/1981  . Smokeless tobacco: Not on file  . Alcohol Use: No  . Drug Use: No  . Sexually Active: Not Currently   Other Topics Concern  . Not on file   Social History Narrative  . No narrative on file    No family history on file.  Review of Systems:  Vitals: Blood pressure 134/60, pulse 64, temperature 97.4 F (36.3 C), resp. rate 14, height 5' 3.39" (1.61 m), weight 155 lb 4.8 oz (70.444 kg).  Physical Exam:  Assessment/Plan:   Jeannette Corpus, MD 02/05/2012, 9:49 AM

## 2012-02-27 NOTE — Anesthesia Postprocedure Evaluation (Signed)
  Anesthesia Post-op Note  Patient: [name]  Procedure(s) Performed: Procedure(s) (LRB): VULVAR LESION (N/A)  Patient Location: PACU  Anesthesia Type: General  Level of Consciousness: awake and alert   Airway and Oxygen Therapy: Patient Spontanous Breathing  Post-op Pain: mild  Post-op Assessment: Post-op Vital signs reviewed, Patient's Cardiovascular Status Stable, Respiratory Function Stable, Patent Airway and No signs of Nausea or vomiting  Post-op Vital Signs: stable  Complications: No apparent anesthesia complications

## 2012-02-27 NOTE — Interval H&P Note (Signed)
History and Physical Interval Note:  02/27/2012 7:11 AM  [name]  has presented today for surgery, with the diagnosis of vulvar cancer  The various methods of treatment have been discussed with the patient and family. After consideration of risks, benefits and other options for treatment, the patient has consented to  Procedure(s) (LRB): VULVAR LESION (N/A) as a surgical intervention .  The patients' history has been reviewed, patient examined, no change in status, stable for surgery.  I have reviewed the patients' chart and labs.  Questions were answered to the patient's satisfaction.     [name],[name]

## 2012-02-27 NOTE — Op Note (Signed)
Preoperative diagnosis: Recurrent vulvar carcinoma  Postoperative diagnosis: Same  Procedure: Radical vulvectomy  Surgeon Reuel Boom L. Stanford Breed M.D.  Asst.: Telford Nab RN  Anesthesia: LMA  Estimated blood loss 200 cc  Surgical findings: Examination under anesthesia revealed a vulvar lesion which was exophytic occupying nearly the entire left vulva. In addition there were some thickened raised areas on the right vulva. Biopsies were obtained and frozen section was performed on these lesions which confirmed invasive vulvar carcinoma on the right side of the vulva as well. Given the extent of the surgical procedure we were unable to close the entire wound. Inguinal femoral lymphadenectomy was not performed given the patient's comorbidities.  Procedure:  The patient is brought to the operating room and after attainment of general anesthesia using LMA was placed in a modified lithotomy position in Vergas stirrups. The vulva and vagina were prepped and draped. The bladder was emptied of 300 cc of urine. The lesion on the left side of the vulva which had previously been biopsied and shown to be recurrent vulvar carcinoma was identified. Marking pen was used to outline the extent of the resection which was essentially the entire left vulva. The scalpel was used to incise the skin and the subcutaneous tissue was excised using cautery. Hemostasis achieved with cautery. The specimen was pinned out on the cork board. Biopsies of the right side of the vulva were obtained and both were returned showing invasive vulvar carcinoma. We then extended the incision over to the right side of the vulva excising nearly the entire right vulva. Likewise the specimen was pinned on cork board for orientation by pathology.  The incisions were very close to the urethral meatus. We were able to undermine the skin on the mons and mobilized it using an advancement flap to cover the anterior urethra. A Foley catheter was  inserted. Posteriorly we mobilized the rectovaginal septum. The patient had a large rectocele allowing for mobilization of the vaginal mucosa to extend down to the anus. This was then sutured in place. Additional closure of the posterior aspect and anterior aspects was obtained until tension precluded any further mobilization of skin edges. The remainder of the vulvar incision was left open. Silvadene cream was applied and the a dressing of Kerlix was applied. The patient was awakened from anesthesia and taken to the recovery room in satisfactory condition sponge needle and history counts correct x2

## 2012-02-28 LAB — BASIC METABOLIC PANEL
BUN: 28 mg/dL — ABNORMAL HIGH (ref 6–23)
Calcium: 8.5 mg/dL (ref 8.4–10.5)
Chloride: 104 mEq/L (ref 96–112)
Creatinine, Ser: 1.84 mg/dL — ABNORMAL HIGH (ref 0.50–1.10)
GFR calc Af Amer: 28 mL/min — ABNORMAL LOW (ref >90)

## 2012-02-28 LAB — CBC
HCT: 30.1 % — ABNORMAL LOW (ref 36.0–46.0)
MCHC: 32.9 g/dL (ref 30.0–36.0)
MCV: 97.4 fL (ref 78.0–100.0)
Platelets: 147 10*3/uL — ABNORMAL LOW (ref 150–400)
RDW: 13.2 % (ref 11.5–15.5)

## 2012-02-28 LAB — GLUCOSE, CAPILLARY
Glucose-Capillary: 303 mg/dL — ABNORMAL HIGH (ref 70–99)
Glucose-Capillary: 150 mg/dL — ABNORMAL HIGH (ref 70–99)

## 2012-02-28 MED ORDER — OXYCODONE-ACETAMINOPHEN 5-325 MG PO TABS
1.0000 | ORAL_TABLET | ORAL | Status: DC | PRN
  Administered 2012-02-28 – 2012-03-01 (×4): 2 via ORAL
  Filled 2012-02-28 (×5): qty 2

## 2012-02-28 NOTE — Progress Notes (Signed)
Spoke with MD Tamela Oddi concerning that patient states that she is seeing red spots, vital signs are stable except patient has a low grade temp of 99.2, MD gave orders for neuro checks q4h Means, Tamarion Haymond N 02-28-12 12:03pm

## 2012-02-28 NOTE — Progress Notes (Signed)
Patient ID: [name], female   DOB: Jun 14, 1926, 78 y.o.   MRN: [id number] 1 Day Post-Op Procedure(s) (LRB): VULVAR LESION (N/A)  Subjective: Patient reports no complaints.    Objective: Vital signs in last 24 hours: Temp:  [97.2 F (36.2 C)-98.8 F (37.1 C)] 98.8 F (37.1 C) (03/06 0600) Pulse Rate:  [51-80] 80  (03/06 0600) Resp:  [8-18] 18  (03/06 0600) BP: (111-138)/(45-79) 132/79 mmHg (03/06 0600) SpO2:  [95 %-100 %] 99 % (03/06 0600) Weight:  [69.854 kg (154 lb)] 69.854 kg (154 lb) (03/05 1110) Last BM Date: 02/26/12  Intake/Output from previous day: 03/05 0701 - 03/06 0700 In: 2255 [P.O.:240; I.V.:2015] Out: 1250 [Urine:1225; Blood:25]  Physical Examination: General: alert and cooperative GI: soft, non-tender; bowel sounds normal; no masses,  no organomegaly Extremities: extremities normal, atraumatic, no cyanosis or edema Vaginal Bleeding: none  Labs: WBC/Hgb/Hct/Plts:  10.7/9.9/30.1/147 (03/06 0435) BUN/Cr/glu/ALT/AST/amyl/lip:  28/1.84/--/--/--/--/-- (03/06 0435)   Assessment:  78 y.o. s/p Procedure(s): VULVAR LESION: stable Pain:  Pain is well-controlled on PCA.  CV: Hypertension:  controlled.  GI:  Tolerating po: Yes     FEN: Hyperkalemia  Endo: Diabetes mellitus Type II, under fair control..  CBG: Elevated CBG x1.  Prophylaxis: intermittent pneumatic compression boots.  Plan: Advance diet Encourage ambulation Advance to PO medication Discontinue IV fluids D/C IVF with KCL   LOS: 1 day    JACKSON-MOORE,Akilah Cureton A 02/28/2012, 8:26 AM

## 2012-02-28 NOTE — Progress Notes (Signed)
Patient assisted out of bed for ambulation.  Patient weak and unsteady requiring 2 person assist.  Pt vital signs stable throughout ambulation.  Pt assisted back to bed and placed on right side.  Pt po intake poor for night shift, however pt did take in 4 oz of cranberry juice at this time.  Patient dressing changed as ordered.  Urinary output for 11-7 shift from foley equals 500 cc. Patient instructed on IS.  IS at 1000-1500. Pt reminded of pain medication available with PCA.  Instructed to patient to call for any needs.

## 2012-02-28 NOTE — Progress Notes (Signed)
Inpatient Diabetes Program Recommendations  AACE/ADA: New Consensus Statement on Inpatient Glycemic Control (2009)  Target Ranges:  Prepandial:   less than 140 mg/dL      Peak postprandial:   less than 180 mg/dL (1-2 hours)      Critically ill patients:  140 - 180 mg/dL   Reason for Visit: Hyperglycemia  Results for [name], [name] (MRN [id number]) as of 02/28/2012 14:13  Ref. Range 02/27/2012 12:23 02/27/2012 16:55 02/27/2012 21:03 02/28/2012 08:55 02/28/2012 11:57  Glucose-Capillary Latest Range: 70-99 mg/dL 119 (H) 147 (H) 829 (H) 258 (H) 303 (H)    Inpatient Diabetes Program Recommendations Correction (SSI): Add Novolog moderate tidwc for correction insulin HgbA1C: Check HgbA1C to assess glycemic control prior to hospitalization  Note: Will follow.

## 2012-02-28 NOTE — Progress Notes (Signed)
Orders received from MD Jacksom-moore for percocet Means, Ennis Heavner N 02-28-12 16:02pm

## 2012-02-29 LAB — GLUCOSE, CAPILLARY: Glucose-Capillary: 119 mg/dL — ABNORMAL HIGH (ref 70–99)

## 2012-02-29 MED ORDER — OXYCODONE-ACETAMINOPHEN 5-325 MG PO TABS: 1.0000 | ORAL_TABLET | Freq: Four times a day (QID) | ORAL | Status: AC | PRN

## 2012-02-29 NOTE — Progress Notes (Signed)
Patient ID: [name], female   DOB: 06/17/1926, 78 y.o.   MRN: [id number]  2 Day Post-Op Procedure(s) (LRB): VULVAR LESION (N/A)  Subjective: Patient reports no complaints.    Objective: Vital signs in last 24 hours: Temp:  [97.2 F (36.2 C)-98.8 F (37.1 C)] 98.8 F (37.1 C) (03/06 0600) Pulse Rate:  [51-80] 80  (03/06 0600) Resp:  [8-18] 18  (03/06 0600) BP: (111-138)/(45-79) 132/79 mmHg (03/06 0600) SpO2:  [95 %-100 %] 99 % (03/06 0600) Weight:  [69.854 kg (154 lb)] 69.854 kg (154 lb) (03/05 1110) Last BM Date: 02/26/12  Intake/Output from previous day: 03/05 0701 - 03/06 0700 In: 2255 [P.O.:240; I.V.:2015] Out: 1250 [Urine:1225; Blood:25]  Physical Examination: General: alert and cooperative GI: soft, non-tender; bowel sounds normal; no masses,  no organomegaly Extremities: extremities normal, atraumatic, no cyanosis or edema Vaginal Bleeding: none  Labs: WBC/Hgb/Hct/Plts:  10.7/9.9/30.1/147 (03/06 0435) BUN/Cr/glu/ALT/AST/amyl/lip:  28/1.84/--/--/--/--/-- (03/06 0435)   Assessment:  78 y.o. s/p Procedure(s): VULVAR LESION: stable Pain:  Pain is well-controlled on oral medications  CV: Hypertension:  controlled.  GI:  Tolerating po: Yes     Endo: Diabetes mellitus Type II, under fair control.  Prophylaxis: intermittent pneumatic compression boots.  Plan  Continue peri-care Discharge planning   LOS: 2 day    JACKSON-MOORE,Reine Bristow A 02/28/2012, 8:26 AM        

## 2012-02-29 NOTE — Discharge Summary (Signed)
Physician Discharge Summary  Patient ID: CATIE CHIAO MRN: [id number] DOB/AGE: 76-09-1926 76 y.o.  Admit date: 02/27/2012 Discharge date: 02/29/2012  Admission Diagnoses: Vulvar cancer  Discharge Diagnoses: Vulvar cancer  Discharged Condition: good  Hospital Course: The patient underwent a vulvectomy.  The postoperative course was uneventful.  She was discharged to home on postoperative day #2.  Consults: None  Significant Diagnostic Studies: None  Treatments: surgery: See above  Discharge Exam: Blood pressure 138/67, pulse 63, temperature 98.9 F (37.2 C), temperature source Oral, resp. rate 18, height 5\' 3"  (1.6 m), weight 69.854 kg (154 lb), SpO2 100.00%. General appearance: alert and cooperative Pelvic: There is some fibrinous exudate.  Minimal erythema induration or drainage.  Suture line is intact. Extremities: extremities normal, atraumatic, no cyanosis or edema  Disposition: 01-Home or Self Care  Discharge Orders    Future Orders Please Complete By Expires   Urinary leg bag      Diet - low sodium heart healthy      Increase activity slowly      May walk up steps      Lifting restrictions      Comments:   No lifting > 30 lbs for 6 weeks   Sexual Activity Restrictions      Comments:   No intercourse for 6 - 8 weeks   Call MD for:  temperature >100.4      Call MD for:  persistant nausea and vomiting      Call MD for:  severe uncontrolled pain      Call MD for:  redness, tenderness, or signs of infection (pain, swelling, redness, odor or green/yellow discharge around incision site)      Call MD for:  persistant dizziness or light-headedness      Call MD for:  extreme fatigue      Discharge wound care:      Comments:   Peri care and apply a moistened gauze and silvadene cream BID     Medication List  As of 02/29/2012  9:03 AM   TAKE these medications         alendronate 70 MG tablet   Commonly known as: FOSAMAX   Take 70 mg by mouth every 7 (seven) days. On  Sunday. Take with a full glass of water on an empty stomach.      aspirin EC 81 MG tablet   Take 81 mg by mouth every morning.      atorvastatin 20 MG tablet   Commonly known as: LIPITOR   Take 20 mg by mouth at bedtime.      diltiazem 240 MG 24 hr capsule   Commonly known as: CARDIZEM CD   Take 240 mg by mouth every morning.      doxazosin 2 MG tablet   Commonly known as: CARDURA   Take 1 mg by mouth at bedtime.      indapamide 1.25 MG tablet   Commonly known as: LOZOL   Take 1.25 mg by mouth every morning.      insulin aspart 100 UNIT/ML injection   Commonly known as: novoLOG   Inject 14-32 Units into the skin 3 (three) times daily before meals. 16 units in the am, 14 at lunch, and 32 at bedtime      insulin detemir 100 UNIT/ML injection   Commonly known as: LEVEMIR   Inject into the skin 2 (two) times daily. 32 units at breakfast, 16 in the evening      levothyroxine 100 MCG  tablet   Commonly known as: SYNTHROID, LEVOTHROID   Take 100 mcg by mouth every morning.      oxyCODONE-acetaminophen 5-325 MG per tablet   Commonly known as: PERCOCET   Take 1 tablet by mouth every 6 (six) hours as needed.      valsartan 320 MG tablet   Commonly known as: DIOVAN   Take 320 mg by mouth every morning.      Vitamin D (Ergocalciferol) 50000 UNITS Caps   Commonly known as: DRISDOL   Take 50,000 Units by mouth every 7 (seven) days. On Mondays.           Follow-up Information    Follow up with [name], MD on 03/04/2012. ( on Monday)    Contact information:   [address] [place] [address] [phone]          Signed: [name] 02/29/2012, 9:03 AM

## 2012-02-29 NOTE — Discharge Planning (Signed)
Home care arragnements made.  Pt will have HHRN through [facility] as arranged with Norberta Keens RN for BID wound care with daily visit Fri, Sat and Sun.  Then biweekly visits to assess progress.  Will meet with family member Carolyn Stare today with WOC nurse to demonstrate wound care.  Ms Patsi Sears and pts grand daughter Maxine Glenn to provide wound care assistance.  Pt will f/u with Dr Tamela Oddi Monday 03/04/12 @ 10:30.

## 2012-02-29 NOTE — Progress Notes (Signed)
CARE MANAGEMENT NOTE 02/29/2012  Patient:  [name], [name]   Account Number:  [id number]  Date Initiated:  02/29/2012  Documentation initiated by:  Tajon Moring  Subjective/Objective Assessment:   78 yo female admitted 02/27/12 with vulvat cancer S/P radical vulvectomy     Action/Plan:   D/C when medically stable   Anticipated DC Date:  02/29/2012   Anticipated DC Plan:  HOME W HOME HEALTH SERVICES      DC Planning Services  CM consult      [facility] Choice  HOME HEALTH   Choice offered to / List presented to:  C-1 Patient        HH arranged  HH-1 RN      [facility] agency  [facility]   Status of service:  Completed, signed off  Discharge Disposition:  HOME W HOME HEALTH SERVICES  Comments:  02/29/12, Kathi Der RNC-MNN, BSN, [phone], CM received referral.  CM met with pt and offered choice for [facility] services.  Pt has chosen [facility].  Darl Pikes at [facility] contacted with orders and confirmation of services received.  Pt states that her grandaughter will be able to assist her at discharge.  HOME HEALTH AGENCIES SERVING [place]   Agencies that are Medicare-Certified and are affiliated with [facility]  Telephone Number Address  [facility]   [facility] has ownership interest in this company; however, you are under no obligation to use this agency. [phone] or  [phone] [address] [place], [place] [address]   Agencies that are Medicare-Certified and are not affiliated with [facility] Agency Telephone Number Address  [facility] [phone] Fax [phone] [address] [place], [place]  [address]  [facility] [phone] or [phone] Fax [phone] [address] [place], [place] [address]  [facility]  [phone] Fax [phone] [address] [place], [place] [address]  [facility] [phone] Fax [phone] [address] [place], [place]  [address]  [facility] [phone] Fax [phone] [address], Suite [place], [place] [address]  [facility] [phone] [address] [place], [place] [address]  [facility] [phone]  [address] [place], [place] [address]  [facility] [phone] or [phone] Fax [phone] [address] [place], [place]  [address]  [facility] [phone] Fax [phone] [address] [place], [place]  [address]  [facility]  [phone] Fax [phone] [address] [place], [place] [address]               Agencies that are not Medicare-Certified and are not affiliated with [facility] Agency Telephone Number Address  [facility] [place] [phone] or [phone] Fax [phone] [address] [place]  [address]  [facility] [phone] Fax [phone] [address] [place], [place]  [address]  [facility]  [phone] Fax [phone] [address] [place], [place] [address]  [facility] [phone] Fax [phone] [address] [place], [place] [address]  [facility] [phone] or [phone] Fax [phone] [address] [place], [place]  [address]  [facility] [phone] or [phone] Fax [phone] [address] [place] [place], [place]  [address]  [facility] [phone] Fax [phone] [address] [place], [place]  [address]  [facility] [phone] [address] [place], [place]  [address]  [facility] [phone] Fax [phone] [address] [place], [place]  [address]  [facility] [phone] Fax [phone] [address] [place], [place]  [address]  [facility] [phone] Fax [phone] [address] [place], [place] [address]  [facility] [phone] Fax [phone] [address] [place], [place]  [address]

## 2012-02-29 NOTE — Discharge Instructions (Signed)
02/29/2012  Activity: 1. Be up and out of the bed during the day.  Take a nap if needed.  You may walk up steps but be careful and use the hand rail.  Stair climbing will tire you more than you think, you may need to stop part way and rest.   2. No lifting or straining for 6 weeks.  3. No driving while taking narcotics.    4. Shower daily.  Use soap and water on your incision and pat dry; don't rub.   5. No sexual activity and nothing in the vagina for  6 weeks.  Diet: 1. Low sodium Heart Healthy Diet is recommended.  2. It is safe to use a laxative if you have difficulty moving your bowels.   Wound Care: 1. Keep clean and dry.  Shower daily.  See discharge instructions.  Reasons to call the Doctor:   Fever - Oral temperature greater than 100.4 degrees Fahrenheit  Foul-smelling vaginal discharge  Difficulty urinating  Nausea and vomiting  Increased pain at the site of the incision that is unrelieved with pain medicine.  Difficulty breathing with or without chest pain  New calf pain especially if only on one side  Sudden, continuing increased vaginal bleeding with or without clots.   Contacts: For questions or concerns you should contact:  Dr. Antionette Char at [phone]  Dr. Stanford Breed at [facility] [phone]

## 2012-03-01 LAB — GLUCOSE, CAPILLARY
Glucose-Capillary: 120 mg/dL — ABNORMAL HIGH (ref 70–99)
Glucose-Capillary: 54 mg/dL — ABNORMAL LOW (ref 70–99)

## 2012-03-01 NOTE — Progress Notes (Signed)
Patient converted to leg bag foley [name], [name] N 03-01-12 10:55am

## 2012-03-01 NOTE — Progress Notes (Signed)
3 Days Post-Op Procedure(s) (LRB): VULVAR LESION (N/A)  Subjective: Patient reports no complaints.    Objective: Vital signs in last 24 hours: Temp:  [97.8 F (36.6 C)-98.9 F (37.2 C)] 97.9 F (36.6 C) (03/08 0602) Pulse Rate:  [51-67] 59  (03/08 0602) Resp:  [18] 18  (03/08 0602) BP: (98-128)/(61-77) 120/77 mmHg (03/08 0602) SpO2:  [95 %-98 %] 97 % (03/08 0602) Last BM Date: 02/26/12  Intake/Output from previous day: 03/07 0701 - 03/08 0700 In: 600 [P.O.:600] Out: 1450 [Urine:1450]  .cbg  Physical Examination: General: alert         Assessment:  78 y.o. s/p Procedure(s): VULVAR LESION: stable Pain:  Pain is well-controlled on oral medications.  CV: Hypertension: controlled   GI:  Tolerating po: Yes    Endo: Diabetes--borderline control  Prophylaxis: intermittent pneumatic compression boots.  Plan: Discharge home Dispo:  Discharge plan to include : referral to [facility] for wound care.  The patient is to be discharged to home.   LOS: 3 days    [name],Lindsay Bizzarro A 03/01/2012, 8:55 AM

## 2012-03-01 NOTE — Progress Notes (Signed)
Pt does not want to have dressing changed at this time for 3rd changed.  Pt would prefer to have changed at 6 am. [name] denies any pain at this time and requests to rest now.  We will change dsg in am.  Will continue to monitor patient this evening.

## 2012-03-01 NOTE — Progress Notes (Signed)
Patient's dressing changed per MD orders, pt tolerated well, vital signs are stable, patient to be discharged home prescriptions given and discharge instructions reviewed with patient's husband and family member, pt to follow up with [employer], Nijee Heatwole N 03-01-12 10:53am

## 2012-03-04 NOTE — Progress Notes (Deleted)
Patient ID: [name], female   DOB: 1926/05/26, 78 y.o.   MRN: [id number]  2 Day Post-Op Procedure(s) (LRB): VULVAR LESION (N/[name])  Subjective: Patient reports no complaints.    Objective: Vital signs in last 24 hours: Temp:  [97.2 F (36.2 C)-98.8 F (37.1 C)] 98.8 F (37.1 C) (03/06 0600) Pulse Rate:  [51-80] 80  (03/06 0600) Resp:  [8-18] 18  (03/06 0600) BP: (111-138)/(45-79) 132/79 mmHg (03/06 0600) SpO2:  [95 %-100 %] 99 % (03/06 0600) Weight:  [69.854 kg (154 lb)] 69.854 kg (154 lb) (03/05 1110) Last BM Date: 02/26/12  Intake/Output from previous day: 03/05 0701 - 03/06 0700 In: 2255 [P.O.:240; I.V.:2015] Out: 1250 [Urine:1225; Blood:25]  Physical Examination: General: alert and cooperative GI: soft, non-tender; bowel sounds normal; no masses,  no organomegaly Extremities: extremities normal, atraumatic, no cyanosis or edema Vaginal Bleeding: none  Labs: WBC/Hgb/Hct/Plts:  10.7/9.9/30.1/147 (03/06 0435) BUN/Cr/glu/ALT/AST/amyl/lip:  28/1.84/--/--/--/--/-- (03/06 0435)   Assessment:  78 y.o. s/p Procedure(s): VULVAR LESION: stable Pain:  Pain is well-controlled on oral medications  CV: Hypertension:  controlled.  GI:  Tolerating po: Yes     Endo: Diabetes mellitus Type II, under fair control.  Prophylaxis: intermittent pneumatic compression boots.  Plan  Continue peri-care Discharge planning   LOS: 2 day    JACKSON-MOORE,[name] 02/28/2012, 8:26 AM

## 2012-03-06 ENCOUNTER — Telehealth: Payer: Self-pay | Admitting: Gynecologic Oncology

## 2012-03-06 NOTE — Telephone Encounter (Signed)
Calling to check on patient post-operatively and to arrange a follow up in the office.  Message left.

## 2012-03-06 NOTE — Telephone Encounter (Signed)
No answer after calling to follow up with patient post-operatively.

## 2012-03-15 ENCOUNTER — Encounter: Payer: Self-pay | Admitting: Gynecology

## 2012-03-15 ENCOUNTER — Ambulatory Visit: Payer: Medicare Other | Attending: Gynecology | Admitting: Gynecology

## 2012-03-15 VITALS — BP 122/56 | HR 62 | Temp 97.5°F | Resp 18 | Ht 63.39 in | Wt 150.5 lb

## 2012-03-15 DIAGNOSIS — I10 Essential (primary) hypertension: Secondary | ICD-10-CM | POA: Insufficient documentation

## 2012-03-15 DIAGNOSIS — E669 Obesity, unspecified: Secondary | ICD-10-CM | POA: Insufficient documentation

## 2012-03-15 DIAGNOSIS — C519 Malignant neoplasm of vulva, unspecified: Secondary | ICD-10-CM | POA: Insufficient documentation

## 2012-03-15 DIAGNOSIS — Z9079 Acquired absence of other genital organ(s): Secondary | ICD-10-CM | POA: Insufficient documentation

## 2012-03-15 DIAGNOSIS — Z9889 Other specified postprocedural states: Secondary | ICD-10-CM | POA: Insufficient documentation

## 2012-03-15 DIAGNOSIS — Z87891 Personal history of nicotine dependence: Secondary | ICD-10-CM | POA: Insufficient documentation

## 2012-03-15 DIAGNOSIS — Z794 Long term (current) use of insulin: Secondary | ICD-10-CM | POA: Insufficient documentation

## 2012-03-15 DIAGNOSIS — Z8673 Personal history of transient ischemic attack (TIA), and cerebral infarction without residual deficits: Secondary | ICD-10-CM | POA: Insufficient documentation

## 2012-03-15 DIAGNOSIS — Z7982 Long term (current) use of aspirin: Secondary | ICD-10-CM | POA: Insufficient documentation

## 2012-03-15 DIAGNOSIS — E119 Type 2 diabetes mellitus without complications: Secondary | ICD-10-CM | POA: Insufficient documentation

## 2012-03-15 NOTE — Progress Notes (Signed)
Consult Note: Gyn-Onc   [name] 78 y.o. female  Chief Complaint  Patient presents with  . Vulvar Cancer    Follow up    Interval History: The patient returns today for postoperative checkup. She is been using Silvadene cream on her open vulvar wound along with irrigation and light packing. Her she comes accompanied by her her home health nurse who indicates she's had much improvement. The patient denies any fever chills or diabetes seems to be under good control. 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 At that time she underwent a modified radical vulvectomy and right inguinal lymphadenectomy. Lymph nodes and surgical margins were negative. She subsequently developed a recurrence on the left vulva in October 2008 underwent left modified radical vulvectomy. Given her age and overall poor performance status we did not perform an inguinal lymphadenectomy.  The patient subsequently had a third recurrence of October 2009 and this was excised and the rhomboid flap was used to fill the surgical defect. Surgical margins were again negative. Her most recent recurrences in September 2012 which is again excised. In March 2013 she underwent a bilateral radical vulvectomy.  All surgical margins were negative.  The wound was left open to close my secondary intent.  No Known Allergies  Past Medical History  Diagnosis Date  . Diabetes mellitus   . Hypertension   . Hyperlipidemia   . Obesity   . Hypothyroidism   . Vulvar cancer, carcinoma 11/03/2011  . Stroke 05/2000    right brain CVA pre H&P 2001    Past Surgical History  Procedure Date  . Vulvar lesion removal 2012  . Breast surgery     Lumpectomy  . Dilation and curettage of uterus   . Cystourethroplasty / ureteroneocystostomy   . Radical vulvectomy   . Lymphadenectomy 2007    R inguinal femoral  . Radical vulvectomy 02/27/12    Current Outpatient  Prescriptions  Medication Sig Dispense Refill  . alendronate (FOSAMAX) 70 MG tablet Take 70 mg by mouth every 7 (seven) days. On Sunday. Take with a full glass of water on an empty stomach.      [name] aspirin EC 81 MG tablet Take 81 mg by mouth every morning.      [name] atorvastatin (LIPITOR) 20 MG tablet Take 20 mg by mouth at bedtime.       [name] diltiazem (CARDIZEM CD) 240 MG 24 hr capsule Take 240 mg by mouth every morning.       [name] doxazosin (CARDURA) 2 MG tablet Take 1 mg by mouth at bedtime.       . indapamide (LOZOL) 1.25 MG tablet Take 1.25 mg by mouth every morning.       . insulin aspart (NOVOLOG) 100 UNIT/ML injection Inject 14-32 Units into the skin 3 (three) times daily before meals. 16 units in the am, 14 at lunch, and 32 at bedtime      . insulin detemir (LEVEMIR) 100 UNIT/ML injection Inject into the skin 2 (two) times daily. 32 units at breakfast, 16 in the evening      . levothyroxine (SYNTHROID, LEVOTHROID) 100 MCG tablet Take 100 mcg by mouth every morning.       . valsartan (DIOVAN) 320 MG tablet Take 320 mg by mouth every morning.       . Vitamin D, Ergocalciferol, (DRISDOL) 50000 UNITS CAPS Take 50,000 Units by mouth every 7 (seven) days. On Mondays.  History   Social History  . Marital Status: Married    Spouse Name: N/A    Number of Children: N/A  . Years of Education: N/A   Occupational History  . Not on file.   Social History Main Topics  . Smoking status: Former Smoker -- 0.2 packs/day for 18 years    Types: Cigarettes    Quit date: 11/03/1967  . Smokeless tobacco: Not on file  . Alcohol Use: No  . Drug Use: No  . Sexually Active: Not Currently   Other Topics Concern  . Not on file   Social History Narrative  . No narrative on file    History reviewed. No pertinent family history.  Review of Systems: 10 point review of systems negative except as noted above  Vitals: Blood pressure 122/56, pulse 62, temperature 97.5 F (36.4 C), temperature source  Oral, resp. rate 18, height 5' 3.39" (1.61 m), weight 150 lb 8 oz (68.266 kg).  Physical Exam: In general the patient's a pleasant elderly white female no acute distress  HEENT is negative  The abdomen is soft nontender no masses again a megaly ascites or hernias are noted. Her graft there is no supraclavicular or inguinal adenopathy.  Pelvic exam  Inspection of the vulva shows that nearly all the open area is granulating nicely. Some necrotic tissue in the posterior right vulva is excised sharply. A new wet packing is placed.  Assessment/Plan: Recurrent vulvar carcinoma. The patient is healing well from her most recent extensive resection. She will continue to use a Foley catheter. She's placed on Macrodantin 100 mg daily for suppression. She returned to see me in 2 weeks   Jeannette Corpus, MD 03/15/2012, 10:57 AM                         Consult Note: Gyn-Onc   [name] 78 y.o. female  Chief Complaint  Patient presents with  . Vulvar Cancer    Follow up    Interval History:   HPI:  No Known Allergies  Past Medical History  Diagnosis Date  . Diabetes mellitus   . Hypertension   . Hyperlipidemia   . Obesity   . Hypothyroidism   . Vulvar cancer, carcinoma 11/03/2011  . Stroke 05/2000    right brain CVA pre H&P 2001    Past Surgical History  Procedure Date  . Vulvar lesion removal 2012  . Breast surgery     Lumpectomy  . Dilation and curettage of uterus   . Cystourethroplasty / ureteroneocystostomy   . Radical vulvectomy   . Lymphadenectomy 2007    R inguinal femoral  . Radical vulvectomy 02/27/12    Current Outpatient Prescriptions  Medication Sig Dispense Refill  . alendronate (FOSAMAX) 70 MG tablet Take 70 mg by mouth every 7 (seven) days. On Sunday. Take with a full glass of water on an empty stomach.      [name] aspirin EC 81 MG tablet Take 81 mg by mouth every morning.      [name] atorvastatin (LIPITOR) 20 MG tablet Take 20 mg by  mouth at bedtime.       [name] diltiazem (CARDIZEM CD) 240 MG 24 hr capsule Take 240 mg by mouth every morning.       [name] doxazosin (CARDURA) 2 MG tablet Take 1 mg by mouth at bedtime.       . indapamide (LOZOL) 1.25 MG tablet Take 1.25 mg by mouth every morning.       [name]  insulin aspart (NOVOLOG) 100 UNIT/ML injection Inject 14-32 Units into the skin 3 (three) times daily before meals. 16 units in the am, 14 at lunch, and 32 at bedtime      . insulin detemir (LEVEMIR) 100 UNIT/ML injection Inject into the skin 2 (two) times daily. 32 units at breakfast, 16 in the evening      . levothyroxine (SYNTHROID, LEVOTHROID) 100 MCG tablet Take 100 mcg by mouth every morning.       . valsartan (DIOVAN) 320 MG tablet Take 320 mg by mouth every morning.       . Vitamin D, Ergocalciferol, (DRISDOL) 50000 UNITS CAPS Take 50,000 Units by mouth every 7 (seven) days. On Mondays.        History   Social History  . Marital Status: Married    Spouse Name: N/A    Number of Children: N/A  . Years of Education: N/A   Occupational History  . Not on file.   Social History Main Topics  . Smoking status: Former Smoker -- 0.2 packs/day for 18 years    Types: Cigarettes    Quit date: 11/03/1967  . Smokeless tobacco: Not on file  . Alcohol Use: No  . Drug Use: No  . Sexually Active: Not Currently   Other Topics Concern  . Not on file   Social History Narrative  . No narrative on file    History reviewed. No pertinent family history.  Review of Systems:  Vitals: Blood pressure 122/56, pulse 62, temperature 97.5 F (36.4 C), temperature source Oral, resp. rate 18, height 5' 3.39" (1.61 m), weight 150 lb 8 oz (68.266 kg).  Physical Exam:  Assessment/Plan:   Jeannette Corpus, MD 03/15/2012, 10:57 AM

## 2012-03-15 NOTE — Patient Instructions (Signed)
Continue current wound management  

## 2012-03-20 ENCOUNTER — Encounter (HOSPITAL_COMMUNITY): Payer: Self-pay | Admitting: Gynecology

## 2012-03-28 ENCOUNTER — Encounter (HOSPITAL_COMMUNITY): Payer: Self-pay | Admitting: Emergency Medicine

## 2012-03-28 ENCOUNTER — Emergency Department (HOSPITAL_COMMUNITY)
Admission: EM | Admit: 2012-03-28 | Discharge: 2012-03-28 | Disposition: A | Discharge status: Home / Self Care | Payer: Medicare Other | Attending: Emergency Medicine | Admitting: Emergency Medicine

## 2012-03-28 DIAGNOSIS — Z794 Long term (current) use of insulin: Secondary | ICD-10-CM | POA: Insufficient documentation

## 2012-03-28 DIAGNOSIS — E785 Hyperlipidemia, unspecified: Secondary | ICD-10-CM | POA: Insufficient documentation

## 2012-03-28 DIAGNOSIS — E119 Type 2 diabetes mellitus without complications: Secondary | ICD-10-CM | POA: Insufficient documentation

## 2012-03-28 DIAGNOSIS — T83091A Other mechanical complication of indwelling urethral catheter, initial encounter: Secondary | ICD-10-CM | POA: Insufficient documentation

## 2012-03-28 DIAGNOSIS — R339 Retention of urine, unspecified: Secondary | ICD-10-CM | POA: Insufficient documentation

## 2012-03-28 DIAGNOSIS — Z7982 Long term (current) use of aspirin: Secondary | ICD-10-CM | POA: Insufficient documentation

## 2012-03-28 DIAGNOSIS — Z79899 Other long term (current) drug therapy: Secondary | ICD-10-CM | POA: Insufficient documentation

## 2012-03-28 DIAGNOSIS — Z8544 Personal history of malignant neoplasm of other female genital organs: Secondary | ICD-10-CM | POA: Insufficient documentation

## 2012-03-28 DIAGNOSIS — Z8673 Personal history of transient ischemic attack (TIA), and cerebral infarction without residual deficits: Secondary | ICD-10-CM | POA: Insufficient documentation

## 2012-03-28 DIAGNOSIS — I10 Essential (primary) hypertension: Secondary | ICD-10-CM | POA: Insufficient documentation

## 2012-03-28 DIAGNOSIS — E039 Hypothyroidism, unspecified: Secondary | ICD-10-CM | POA: Insufficient documentation

## 2012-03-28 DIAGNOSIS — Y846 Urinary catheterization as the cause of abnormal reaction of the patient, or of later complication, without mention of misadventure at the time of the procedure: Secondary | ICD-10-CM | POA: Insufficient documentation

## 2012-03-28 DIAGNOSIS — R109 Unspecified abdominal pain: Secondary | ICD-10-CM | POA: Insufficient documentation

## 2012-03-28 LAB — URINALYSIS, ROUTINE W REFLEX MICROSCOPIC
Bilirubin Urine: NEGATIVE
Glucose, UA: NEGATIVE mg/dL
Ketones, ur: NEGATIVE mg/dL
Protein, ur: NEGATIVE mg/dL
Urobilinogen, UA: 0.2 mg/dL (ref 0.0–1.0)

## 2012-03-28 LAB — POCT I-STAT, CHEM 8
BUN: 40 mg/dL — ABNORMAL HIGH (ref 6–23)
Calcium, Ion: 1.24 mmol/L (ref 1.12–1.32)
Chloride: 109 mEq/L (ref 96–112)
Creatinine, Ser: 1.5 mg/dL — ABNORMAL HIGH (ref 0.50–1.10)
TCO2: 23 mmol/L (ref 0–100)

## 2012-03-28 NOTE — ED Notes (Signed)
Pt states she emptied her bag before she left home to come here, approx 200cc urine in bag at this time.

## 2012-03-28 NOTE — ED Notes (Signed)
Pt reports that her urinary cath has stopped working today and she is feeling pressure and needs to urinated but unable to. Pt had cancer surgery yesterday on Vulva and was to see her dr tomorrow and have cath removed.

## 2012-03-28 NOTE — ED Notes (Signed)
Per EMS pt from home and urinary cath stopped working this am at 4:30. Home health nurse tried to get it working at 11 am and was unable to. Pt has hx of Diabetes and Vulva Cancer.

## 2012-03-28 NOTE — ED Notes (Signed)
Pt's abd scan with bladder scanner and only 24cc noted per bladder scanner. Pt's foley flushed. W/o any residual noted. Pt tolerated well. Pt with new leg bag on.

## 2012-03-28 NOTE — ED Provider Notes (Signed)
History     CSN: [id number]  Arrival date & time 03/28/12  1726   First MD Initiated Contact with Patient 03/28/12 2011      No chief complaint on file.   (Consider location/radiation/quality/duration/timing/severity/associated sxs/prior treatment) The history is provided by the patient.   patient has a urinary catheter after vulva surgery. She states that earlier today she began having a feeling of blocked off and decreased output. She had vulvar cancer has had a catheter in since surgery. She is scheduled to see the surgeon tomorrow and maybe have removed. By the time of my evaluation it has been irrigated and has had good output.    Past Medical History  Diagnosis Date  . Diabetes mellitus   . Hypertension   . Hyperlipidemia   . Obesity   . Hypothyroidism   . Vulvar cancer, carcinoma 11/03/2011  . Stroke 05/2000    right brain CVA pre H&P 2001    Past Surgical History  Procedure Date  . Vulvar lesion removal 2012  . Breast surgery     Lumpectomy  . Dilation and curettage of uterus   . Cystourethroplasty / ureteroneocystostomy   . Radical vulvectomy   . Lymphadenectomy 2007    R inguinal femoral  . Radical vulvectomy 02/27/12  . Vulvar lesion removal 02/27/2012    Procedure: VULVAR LESION;  Surgeon: Jeannette Corpus, MD;  Location: [facility];  Service: Gynecology;  Laterality: N/A;    No family history on file.  History  Substance Use Topics  . Smoking status: Former Smoker -- 0.2 packs/day for 18 years    Types: Cigarettes    Quit date: 11/03/1967  . Smokeless tobacco: Not on file  . Alcohol Use: No    OB History    Grav Para Term Preterm Abortions TAB SAB Ect Mult Living                  Review of Systems  Constitutional: Negative for fatigue.  Gastrointestinal: Positive for abdominal pain. Negative for rectal pain.  Genitourinary: Positive for decreased urine volume. Negative for flank pain and vaginal discharge.    Allergies  Review of patient's  allergies indicates no known allergies.  Home Medications   Current Outpatient Rx  Name Route Sig Dispense Refill  . ALENDRONATE SODIUM 70 MG PO TABS Oral Take 70 mg by mouth every 7 (seven) days. On Sunday. Take with a full glass of water on an empty stomach.    . ASPIRIN EC 81 MG PO TBEC Oral Take 81 mg by mouth every morning.    . ATORVASTATIN CALCIUM 20 MG PO TABS Oral Take 20 mg by mouth at bedtime.     [name] DILTIAZEM HCL ER COATED BEADS 240 MG PO CP24 Oral Take 240 mg by mouth every morning.     [name] DOXAZOSIN MESYLATE 2 MG PO TABS Oral Take 1 mg by mouth at bedtime.     . INDAPAMIDE 1.25 MG PO TABS Oral Take 1.25 mg by mouth every morning.     . INSULIN ASPART 100 UNIT/ML [place] SOLN Subcutaneous Inject 14-32 Units into the skin 3 (three) times daily before meals. 16 units in the am, 14 at lunch, and 32 at bedtime    . INSULIN DETEMIR 100 UNIT/ML  SOLN Subcutaneous Inject 16-32 Units into the skin 2 (two) times daily. 32 units at breakfast, 16 in the evening    . LEVOTHYROXINE SODIUM 100 MCG PO TABS Oral Take 100 mcg by mouth every morning.     [name]  NITROFURANTOIN MACROCRYSTAL 100 MG PO CAPS Oral Take 100 mg by mouth at bedtime.    [name] VALSARTAN 320 MG PO TABS Oral Take 320 mg by mouth every morning.     [name] VITAMIN D (ERGOCALCIFEROL) 50000 UNITS PO CAPS Oral Take 50,000 Units by mouth every 7 (seven) days. On Mondays.      BP 146/122  Pulse 98  Resp 20  SpO2 98%  Physical Exam  Nursing note and vitals reviewed. Constitutional: She is oriented to person, place, and time. She appears well-developed and well-nourished.  HENT:  Head: Normocephalic and atraumatic.  Eyes: EOM are normal. Pupils are equal, round, and reactive to light.  Neck: Normal range of motion. Neck supple.  Cardiovascular: Normal rate, regular rhythm and normal heart sounds.   No murmur heard. Pulmonary/Chest: Effort normal and breath sounds normal. No respiratory distress. She has no wheezes. She has no rales.  Abdominal:  Soft. Bowel sounds are normal. She exhibits no distension. There is no tenderness. There is no rebound and no guarding.  Genitourinary:       Postsurgical vulva. Sutures and Foley catheter in place.  Musculoskeletal: Normal range of motion.  Neurological: She is alert and oriented to person, place, and time. No cranial nerve deficit.  Skin: Skin is warm and dry.  Psychiatric: She has a normal mood and affect. Her speech is normal.    ED Course  Procedures (including critical care time)  Labs Reviewed  URINALYSIS, ROUTINE W REFLEX MICROSCOPIC - Abnormal; Notable for the following:    Hgb urine dipstick SMALL (*)    Leukocytes, UA MODERATE (*)    All other components within normal limits  URINE MICROSCOPIC-ADD ON - Abnormal; Notable for the following:    Bacteria, UA FEW (*)    All other components within normal limits  POCT I-STAT, CHEM 8 - Abnormal; Notable for the following:    BUN 40 (*)    Creatinine, Ser 1.50 (*)    Glucose, Bld 205 (*)    All other components within normal limits  URINE CULTURE   No results found.   1. Obstructed Foley catheter       MDM  Obstructed Foley catheter. She has again after vulvar surgery is then flushed and is now flowing freely. She has a possible urinary tract infection, but started on Macrobid. Urine culture is sent. Her creatinine is stable to improved. She'll be discharged home and followup tomorrow with his surgeon as planned in        [place] R. Rubin Payor, MD 03/28/12 2157

## 2012-03-29 ENCOUNTER — Encounter: Payer: Self-pay | Admitting: Gynecology

## 2012-03-29 ENCOUNTER — Ambulatory Visit: Payer: Medicare Other | Attending: Gynecology | Admitting: Gynecology

## 2012-03-29 VITALS — BP 118/60 | HR 60 | Temp 97.3°F | Resp 16 | Ht 63.39 in | Wt 150.0 lb

## 2012-03-29 DIAGNOSIS — E119 Type 2 diabetes mellitus without complications: Secondary | ICD-10-CM | POA: Insufficient documentation

## 2012-03-29 DIAGNOSIS — E669 Obesity, unspecified: Secondary | ICD-10-CM | POA: Insufficient documentation

## 2012-03-29 DIAGNOSIS — Z8673 Personal history of transient ischemic attack (TIA), and cerebral infarction without residual deficits: Secondary | ICD-10-CM | POA: Insufficient documentation

## 2012-03-29 DIAGNOSIS — C519 Malignant neoplasm of vulva, unspecified: Secondary | ICD-10-CM | POA: Insufficient documentation

## 2012-03-29 DIAGNOSIS — I10 Essential (primary) hypertension: Secondary | ICD-10-CM | POA: Insufficient documentation

## 2012-03-29 DIAGNOSIS — Z794 Long term (current) use of insulin: Secondary | ICD-10-CM | POA: Insufficient documentation

## 2012-03-29 DIAGNOSIS — Z466 Encounter for fitting and adjustment of urinary device: Secondary | ICD-10-CM | POA: Insufficient documentation

## 2012-03-29 DIAGNOSIS — Z7982 Long term (current) use of aspirin: Secondary | ICD-10-CM | POA: Insufficient documentation

## 2012-03-29 DIAGNOSIS — Z87891 Personal history of nicotine dependence: Secondary | ICD-10-CM | POA: Insufficient documentation

## 2012-03-29 DIAGNOSIS — Z9079 Acquired absence of other genital organ(s): Secondary | ICD-10-CM | POA: Insufficient documentation

## 2012-03-29 NOTE — Patient Instructions (Signed)
Return in 2-3 weeks for further followup. At that time we'll attempt to remove the Foley catheter again. Continue current wound care to the vulva.

## 2012-03-29 NOTE — Progress Notes (Signed)
Consult Note: Gyn-Onc   [name] 78 y.o. female  Chief Complaint  Patient presents with  . Vulvar Cancer    Follow up    Interval History: Interval History: The patient returns today for continued evaluation of vulvar healing. She is been using Silvadene cream on her open vulvar wound along with irrigation and light packing.  The patient denies any fever chills or diabetes seems to be under good control. She continues to have a catheter in place.  Because of decreased urine output, she came to the ED last night.  The catheter was flushed and she seemed to drain well thereafter.  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 At that time she underwent a modified radical vulvectomy and right inguinal lymphadenectomy. Lymph nodes and surgical margins were negative. She subsequently developed a recurrence on the left vulva in October 2008 underwent left modified radical vulvectomy. Given her age and overall poor performance status we did not perform an inguinal lymphadenectomy.  The patient subsequently had a third recurrence of October 2009 and this was excised and the rhomboid flap was used to fill the surgical defect. Surgical margins were again negative. Her most recent recurrences in September 2012 which is again excised.  In March 2013 she underwent a bilateral radical vulvectomy. All surgical margins were negative. The wound was left open to close my secondary intent.      No Known Allergies  Past Medical History  Diagnosis Date  . Diabetes mellitus   . Hypertension   . Hyperlipidemia   . Obesity   . Hypothyroidism   . Vulvar cancer, carcinoma 11/03/2011  . Stroke 05/2000    right brain CVA pre H&P 2001    Past Surgical History  Procedure Date  . Vulvar lesion removal 2012  . Breast surgery     Lumpectomy  . Dilation and curettage of uterus   . Cystourethroplasty / ureteroneocystostomy   . Radical vulvectomy   . Lymphadenectomy 2007    R  inguinal femoral  . Radical vulvectomy 02/27/12  . Vulvar lesion removal 02/27/2012    Procedure: VULVAR LESION;  Surgeon: Jeannette Corpus, MD;  Location: [facility];  Service: Gynecology;  Laterality: N/A;    Current Outpatient Prescriptions  Medication Sig Dispense Refill  . alendronate (FOSAMAX) 70 MG tablet Take 70 mg by mouth every 7 (seven) days. On Sunday. Take with a full glass of water on an empty stomach.      [name] aspirin EC 81 MG tablet Take 81 mg by mouth every morning.      [name] atorvastatin (LIPITOR) 20 MG tablet Take 20 mg by mouth at bedtime.       [name] diltiazem (CARDIZEM CD) 240 MG 24 hr capsule Take 240 mg by mouth every morning.       [name] doxazosin (CARDURA) 2 MG tablet Take 1 mg by mouth at bedtime.       . indapamide (LOZOL) 1.25 MG tablet Take 1.25 mg by mouth every morning.       . insulin aspart (NOVOLOG) 100 UNIT/ML injection Inject 14-32 Units into the skin 3 (three) times daily before meals. 16 units in the am, 14 at lunch, and 32 at bedtime      . insulin detemir (LEVEMIR) 100 UNIT/ML injection Inject 16-32 Units into the skin 2 (two) times daily. 32 units at breakfast, 16 in the evening      . levothyroxine (SYNTHROID, LEVOTHROID) 100 MCG tablet Take 100 mcg by  mouth every morning.       . nitrofurantoin (MACRODANTIN) 100 MG capsule Take 100 mg by mouth at bedtime.      . valsartan (DIOVAN) 320 MG tablet Take 320 mg by mouth every morning.       . Vitamin D, Ergocalciferol, (DRISDOL) 50000 UNITS CAPS Take 50,000 Units by mouth every 7 (seven) days. On Mondays.        History   Social History  . Marital Status: Married    Spouse Name: N/A    Number of Children: N/A  . Years of Education: N/A   Occupational History  . Not on file.   Social History Main Topics  . Smoking status: Former Smoker -- 0.2 packs/day for 18 years    Types: Cigarettes    Quit date: 11/03/1967  . Smokeless tobacco: Not on file  . Alcohol Use: No  . Drug Use: No  . Sexually Active: Not  Currently   Other Topics Concern  . Not on file   Social History Narrative  . No narrative on file    No family history on file.  Review of Systems:  Vitals: Blood pressure 118/60, pulse 60, temperature 97.3 F (36.3 C), temperature source Oral, resp. rate 16, height 5' 3.39" (1.61 m), weight 150 lb (68.04 kg).   Physical Exam: In general the patient's a pleasant elderly white female no acute distress  HEENT is negative  The abdomen is soft nontender no masses again a megaly ascites or hernias are noted. Her graft there is no supraclavicular or inguinal adenopathy.  Pelvic exam  Inspection of the vulva shows that nearly all the open area is granulating nicely  And is beginning to be covered with skin.  No necrotic areas are noted. Assessment/Plan: Recurrent vulvar carcinoma. The patient is healing well from her most recent extensive resection. The Foley catheter was removed and we awaited the patient to void spontaneously. Unfortunately, after several hours, she was unable to void and we therefore placed a new sterile Foley catheter. We'll have the patient return in 2 or 3 weeks for reevaluation. In the meantime she'll continue her routine vulvar wound care. Jeannette Corpus, MD 03/29/2012, 12:05 PM                         Consult Note: Gyn-Onc   [name] 78 y.o. female  Chief Complaint  Patient presents with  . Vulvar Cancer    Follow up    Interval History:   HPI:  No Known Allergies  Past Medical History  Diagnosis Date  . Diabetes mellitus   . Hypertension   . Hyperlipidemia   . Obesity   . Hypothyroidism   . Vulvar cancer, carcinoma 11/03/2011  . Stroke 05/2000    right brain CVA pre H&P 2001    Past Surgical History  Procedure Date  . Vulvar lesion removal 2012  . Breast surgery     Lumpectomy  . Dilation and curettage of uterus   . Cystourethroplasty / ureteroneocystostomy   . Radical vulvectomy   . Lymphadenectomy  2007    R inguinal femoral  . Radical vulvectomy 02/27/12  . Vulvar lesion removal 02/27/2012    Procedure: VULVAR LESION;  Surgeon: Jeannette Corpus, MD;  Location: [facility];  Service: Gynecology;  Laterality: N/A;    Current Outpatient Prescriptions  Medication Sig Dispense Refill  . alendronate (FOSAMAX) 70 MG tablet Take 70 mg by mouth every 7 (seven) days.  On Sunday. Take with a full glass of water on an empty stomach.      [name] aspirin EC 81 MG tablet Take 81 mg by mouth every morning.      [name] atorvastatin (LIPITOR) 20 MG tablet Take 20 mg by mouth at bedtime.       [name] diltiazem (CARDIZEM CD) 240 MG 24 hr capsule Take 240 mg by mouth every morning.       [name] doxazosin (CARDURA) 2 MG tablet Take 1 mg by mouth at bedtime.       . indapamide (LOZOL) 1.25 MG tablet Take 1.25 mg by mouth every morning.       . insulin aspart (NOVOLOG) 100 UNIT/ML injection Inject 14-32 Units into the skin 3 (three) times daily before meals. 16 units in the am, 14 at lunch, and 32 at bedtime      . insulin detemir (LEVEMIR) 100 UNIT/ML injection Inject 16-32 Units into the skin 2 (two) times daily. 32 units at breakfast, 16 in the evening      . levothyroxine (SYNTHROID, LEVOTHROID) 100 MCG tablet Take 100 mcg by mouth every morning.       . nitrofurantoin (MACRODANTIN) 100 MG capsule Take 100 mg by mouth at bedtime.      . valsartan (DIOVAN) 320 MG tablet Take 320 mg by mouth every morning.       . Vitamin D, Ergocalciferol, (DRISDOL) 50000 UNITS CAPS Take 50,000 Units by mouth every 7 (seven) days. On Mondays.        History   Social History  . Marital Status: Married    Spouse Name: N/A    Number of Children: N/A  . Years of Education: N/A   Occupational History  . Not on file.   Social History Main Topics  . Smoking status: Former Smoker -- 0.2 packs/day for 18 years    Types: Cigarettes    Quit date: 11/03/1967  . Smokeless tobacco: Not on file  . Alcohol Use: No  . Drug Use: No  . Sexually  Active: Not Currently   Other Topics Concern  . Not on file   Social History Narrative  . No narrative on file    No family history on file.  Review of Systems:  Vitals: Blood pressure 118/60, pulse 60, temperature 97.3 F (36.3 C), temperature source Oral, resp. rate 16, height 5' 3.39" (1.61 m), weight 150 lb (68.04 kg).  Physical Exam:  Assessment/Plan:   Jeannette Corpus, MD 03/29/2012, 12:05 PM

## 2012-03-31 LAB — URINE CULTURE: Colony Count: >=100000

## 2012-04-01 NOTE — ED Notes (Signed)
Chart returned from EDP office. Prescribed Keflex 500 mg PO TID x 7 days. Prescribed by Trixie Dredge PA-C.

## 2012-04-01 NOTE — ED Notes (Signed)
+   Urine Chart sent to EDP office for review. 

## 2012-04-02 NOTE — ED Notes (Signed)
Pt notified of results and need for addl tx.  Rx for Keflex called in and left on VM at [facility] [phone].

## 2012-04-17 ENCOUNTER — Encounter: Payer: Self-pay | Admitting: Gynecologic Oncology

## 2012-04-17 NOTE — Progress Notes (Signed)
Pt arrived to the clinic for foley removal today.  Foley was removed at 9:27am without difficulty.  Clear yellow urine noted in the tubing.  Pt assisted back to the lobby to rest and wait for the urge to void.  At 10:50, the patient was able to void 150cc of clear yellow urine without difficulty.  Vulva inspected prior to pt leaving with the surgical area healing.  Reportable signs and symptoms discussed.  Follow up appointment made for May 06, 2012.  No concerns or questions voiced.

## 2012-04-21 ENCOUNTER — Inpatient Hospital Stay (HOSPITAL_COMMUNITY): Payer: Medicare Other

## 2012-04-21 ENCOUNTER — Encounter (HOSPITAL_COMMUNITY): Payer: Self-pay | Admitting: Family Medicine

## 2012-04-21 ENCOUNTER — Inpatient Hospital Stay (HOSPITAL_COMMUNITY)
Admission: EM | Admit: 2012-04-21 | Discharge: 2012-04-25 | DRG: 064 | Disposition: A | Discharge status: Rehabilitation Facility | Payer: Medicare Other | Attending: Internal Medicine | Admitting: Internal Medicine

## 2012-04-21 ENCOUNTER — Emergency Department (HOSPITAL_COMMUNITY): Payer: Medicare Other

## 2012-04-21 DIAGNOSIS — D72829 Elevated white blood cell count, unspecified: Secondary | ICD-10-CM | POA: Diagnosis present

## 2012-04-21 DIAGNOSIS — W06XXXA Fall from bed, initial encounter: Secondary | ICD-10-CM | POA: Diagnosis present

## 2012-04-21 DIAGNOSIS — I639 Cerebral infarction, unspecified: Secondary | ICD-10-CM | POA: Diagnosis present

## 2012-04-21 DIAGNOSIS — R5381 Other malaise: Secondary | ICD-10-CM | POA: Diagnosis present

## 2012-04-21 DIAGNOSIS — Z8673 Personal history of transient ischemic attack (TIA), and cerebral infarction without residual deficits: Secondary | ICD-10-CM

## 2012-04-21 DIAGNOSIS — E669 Obesity, unspecified: Secondary | ICD-10-CM | POA: Diagnosis present

## 2012-04-21 DIAGNOSIS — I10 Essential (primary) hypertension: Secondary | ICD-10-CM | POA: Diagnosis present

## 2012-04-21 DIAGNOSIS — E119 Type 2 diabetes mellitus without complications: Secondary | ICD-10-CM | POA: Diagnosis present

## 2012-04-21 DIAGNOSIS — I6350 Cerebral infarction due to unspecified occlusion or stenosis of unspecified cerebral artery: Principal | ICD-10-CM | POA: Diagnosis present

## 2012-04-21 DIAGNOSIS — J9601 Acute respiratory failure with hypoxia: Secondary | ICD-10-CM | POA: Diagnosis present

## 2012-04-21 DIAGNOSIS — Z7401 Bed confinement status: Secondary | ICD-10-CM

## 2012-04-21 DIAGNOSIS — E039 Hypothyroidism, unspecified: Secondary | ICD-10-CM | POA: Diagnosis present

## 2012-04-21 DIAGNOSIS — Y998 Other external cause status: Secondary | ICD-10-CM

## 2012-04-21 DIAGNOSIS — E785 Hyperlipidemia, unspecified: Secondary | ICD-10-CM | POA: Diagnosis present

## 2012-04-21 DIAGNOSIS — C519 Malignant neoplasm of vulva, unspecified: Secondary | ICD-10-CM | POA: Diagnosis present

## 2012-04-21 DIAGNOSIS — J9600 Acute respiratory failure, unspecified whether with hypoxia or hypercapnia: Secondary | ICD-10-CM | POA: Diagnosis present

## 2012-04-21 DIAGNOSIS — D696 Thrombocytopenia, unspecified: Secondary | ICD-10-CM | POA: Diagnosis present

## 2012-04-21 DIAGNOSIS — I4891 Unspecified atrial fibrillation: Secondary | ICD-10-CM | POA: Diagnosis present

## 2012-04-21 HISTORY — DX: Unspecified atrial fibrillation: I48.91

## 2012-04-21 LAB — COMPREHENSIVE METABOLIC PANEL
AST: 20 U/L (ref 0–37)
BUN: 20 mg/dL (ref 6–23)
CO2: 25 mEq/L (ref 19–32)
Calcium: 10 mg/dL (ref 8.4–10.5)
Creatinine, Ser: 1.17 mg/dL — ABNORMAL HIGH (ref 0.50–1.10)
GFR calc non Af Amer: 42 mL/min — ABNORMAL LOW (ref >90)
Total Bilirubin: 0.8 mg/dL (ref 0.3–1.2)

## 2012-04-21 LAB — PROTIME-INR
INR: 1.02 (ref 0.00–1.49)
Prothrombin Time: 13.6 s (ref 11.6–15.2)

## 2012-04-21 LAB — APTT: aPTT: 29 seconds (ref 24–37)

## 2012-04-21 LAB — CBC
HCT: 38.3 % (ref 36.0–46.0)
MCH: 32.1 pg (ref 26.0–34.0)
MCV: 92.5 fL (ref 78.0–100.0)
Platelets: 142 10*3/uL — ABNORMAL LOW (ref 150–400)
RBC: 4.14 MIL/uL (ref 3.87–5.11)
RDW: 14.2 % (ref 11.5–15.5)

## 2012-04-21 LAB — GLUCOSE, CAPILLARY: Glucose-Capillary: 212 mg/dL — ABNORMAL HIGH (ref 70–99)

## 2012-04-21 MED ORDER — SODIUM CHLORIDE 0.9 % IV SOLN
1000.0000 mL | INTRAVENOUS | Status: DC
  Administered 2012-04-21: 1000 mL via INTRAVENOUS

## 2012-04-21 MED ORDER — DILTIAZEM HCL 90 MG PO TABS
90.0000 mg | ORAL_TABLET | Freq: Four times a day (QID) | ORAL | Status: DC
  Filled 2012-04-21 (×6): qty 1

## 2012-04-21 MED ORDER — INSULIN ASPART 100 UNIT/ML ~~LOC~~ SOLN
0.0000 [IU] | Freq: Three times a day (TID) | SUBCUTANEOUS | Status: DC
  Administered 2012-04-22: 3 [IU] via SUBCUTANEOUS
  Administered 2012-04-22: 8 [IU] via SUBCUTANEOUS

## 2012-04-21 MED ORDER — DOXAZOSIN MESYLATE 1 MG PO TABS
0.5000 mg | ORAL_TABLET | Freq: Every day | ORAL | Status: DC
  Filled 2012-04-21: qty 0.5

## 2012-04-21 MED ORDER — INSULIN DETEMIR 100 UNIT/ML ~~LOC~~ SOLN
20.0000 [IU] | Freq: Two times a day (BID) | SUBCUTANEOUS | Status: DC
  Administered 2012-04-22: 20 [IU] via SUBCUTANEOUS
  Filled 2012-04-21: qty 10

## 2012-04-21 MED ORDER — INDAPAMIDE 1.25 MG PO TABS
1.2500 mg | ORAL_TABLET | Freq: Every day | ORAL | Status: DC
  Filled 2012-04-21: qty 1

## 2012-04-21 MED ORDER — INSULIN ASPART 100 UNIT/ML ~~LOC~~ SOLN: 14.0000 [IU] | Freq: Three times a day (TID) | SUBCUTANEOUS | Status: DC

## 2012-04-21 MED ORDER — ALUM & MAG HYDROXIDE-SIMETH 200-200-20 MG/5ML PO SUSP: 30.0000 mL | Freq: Four times a day (QID) | ORAL | Status: DC | PRN

## 2012-04-21 MED ORDER — ONDANSETRON HCL 4 MG/2ML IJ SOLN: 4.0000 mg | Freq: Four times a day (QID) | INTRAMUSCULAR | Status: DC | PRN

## 2012-04-21 MED ORDER — ACETAMINOPHEN 325 MG PO TABS: 650.0000 mg | ORAL_TABLET | Freq: Four times a day (QID) | ORAL | Status: DC | PRN

## 2012-04-21 MED ORDER — INSULIN ASPART 100 UNIT/ML ~~LOC~~ SOLN: 0.0000 [IU] | Freq: Every day | SUBCUTANEOUS | Status: DC

## 2012-04-21 MED ORDER — DILTIAZEM HCL 100 MG IV SOLR
5.0000 mg/h | INTRAVENOUS | Status: DC
  Administered 2012-04-21: 5 mg/h via INTRAVENOUS
  Administered 2012-04-22: 15 mg/h via INTRAVENOUS
  Filled 2012-04-21: qty 100

## 2012-04-21 MED ORDER — ONDANSETRON HCL 4 MG PO TABS: 4.0000 mg | ORAL_TABLET | Freq: Four times a day (QID) | ORAL | Status: DC | PRN

## 2012-04-21 MED ORDER — ASPIRIN 81 MG PO CHEW
324.0000 mg | CHEWABLE_TABLET | Freq: Once | ORAL | Status: DC
  Filled 2012-04-21: qty 4

## 2012-04-21 MED ORDER — HYDROCODONE-ACETAMINOPHEN 5-325 MG PO TABS: 1.0000 | ORAL_TABLET | ORAL | Status: DC | PRN

## 2012-04-21 MED ORDER — SODIUM CHLORIDE 0.9 % IJ SOLN: 3.0000 mL | Freq: Two times a day (BID) | INTRAMUSCULAR | Status: DC

## 2012-04-21 MED ORDER — POTASSIUM CHLORIDE IN NACL 20-0.9 MEQ/L-% IV SOLN
INTRAVENOUS | Status: AC
  Administered 2012-04-22: 01:00:00 via INTRAVENOUS
  Filled 2012-04-21 (×2): qty 1000

## 2012-04-21 MED ORDER — ASPIRIN EC 81 MG PO TBEC: 81.0000 mg | DELAYED_RELEASE_TABLET | Freq: Every day | ORAL | Status: DC

## 2012-04-21 MED ORDER — LEVOTHYROXINE SODIUM 100 MCG PO TABS
100.0000 ug | ORAL_TABLET | Freq: Every day | ORAL | Status: DC
  Filled 2012-04-21: qty 1

## 2012-04-21 MED ORDER — DILTIAZEM HCL 25 MG/5ML IV SOLN
10.0000 mg | Freq: Once | INTRAVENOUS | Status: AC
  Administered 2012-04-21: 25 mg via INTRAVENOUS
  Filled 2012-04-21: qty 5

## 2012-04-21 MED ORDER — OLMESARTAN MEDOXOMIL 20 MG PO TABS
20.0000 mg | ORAL_TABLET | Freq: Every day | ORAL | Status: DC
  Filled 2012-04-21: qty 1

## 2012-04-21 MED ORDER — SIMVASTATIN 20 MG PO TABS
20.0000 mg | ORAL_TABLET | Freq: Every day | ORAL | Status: DC
  Filled 2012-04-21: qty 1

## 2012-04-21 MED ORDER — ENOXAPARIN SODIUM 40 MG/0.4ML ~~LOC~~ SOLN
40.0000 mg | SUBCUTANEOUS | Status: DC
  Administered 2012-04-22 – 2012-04-24 (×3): 40 mg via SUBCUTANEOUS
  Filled 2012-04-21 (×3): qty 0.4

## 2012-04-21 MED ORDER — ACETAMINOPHEN 650 MG RE SUPP: 650.0000 mg | Freq: Four times a day (QID) | RECTAL | Status: DC | PRN

## 2012-04-21 NOTE — ED Notes (Signed)
1st attempt to call report unsuccessful 

## 2012-04-21 NOTE — ED Notes (Signed)
Per EMS, pt had onset of vomiting and weakness since about 1 am. Pt unsteady gait and in afib RVR. Hx of abib. Denies chest pain and SOB

## 2012-04-21 NOTE — ED Notes (Signed)
2nd attempt to call report unsuccessful.  

## 2012-04-21 NOTE — ED Notes (Signed)
Admitting MD at bedside.

## 2012-04-21 NOTE — H&P (Signed)
PCP:   [name], MD, MD  Cardiologist; Deboraha Sprang cardiology Oncologist: Dr. De Blanch  Chief Complaint:  Weakness, fall  HPI: This is a pleasant 78 year old female who approximately 8 weeks ago underwent radical vulvectomy for recurrent vulvar cancer. Currently she's not undergoing chemotherapy or radiation therapy. Over the past 8 weeks patient has mostly been sedentary and bedbound per family mostly due to indwelling Foley. The Foley was removed approximately 5 days ago. She has been up in the chair a few times since. She has not had a physical therapy. Last night for the first time she went out to dinner with her husband. Approximately 6 hours later she developed severe nausea and vomiting, no hematemesis. they went to bed, she later wanted to use the bedside commode but she was too weak. She eventually rolled off the bed and fell on the floor. Her husband was not able to help her up - he too is disabled. After much struggle she was able to get back in bed. This a.m. they called 911 and she was brought to the ER because she continued to be weak. In the ER she was found to have A. fib with RVR. She reports no localized weakness. She does have a history of atrial fibrillation maintained on aspirin. History provided by the husband, son and patient. Patient is alert and oriented.   Review of Systems: Positives bolded  The patient denies anorexia, fever, weight loss,, vision loss, decreased hearing, hoarseness, chest pain, syncope, dyspnea on exertion, peripheral edema, balance deficits, hemoptysis, abdominal pain, melena, hematochezia, severe indigestion/heartburn, hematuria, incontinence, genital sores, muscle weakness, suspicious skin lesions, transient blindness, difficulty walking, depression, unusual weight change, abnormal bleeding, enlarged lymph nodes, angioedema, and breast masses.  Past Medical History: Past Medical History  Diagnosis Date  . Diabetes mellitus   . Hypertension     . Hyperlipidemia   . Obesity   . Hypothyroidism   . Vulvar cancer, carcinoma 11/03/2011  . Stroke 05/2000    right brain CVA pre H&P 2001  . Atrial fibrillation    Past Surgical History  Procedure Date  . Vulvar lesion removal 2012  . Breast surgery     Lumpectomy  . Dilation and curettage of uterus   . Cystourethroplasty / ureteroneocystostomy   . Radical vulvectomy   . Lymphadenectomy 2007    R inguinal femoral  . Radical vulvectomy 02/27/12  . Vulvar lesion removal 02/27/2012    Procedure: VULVAR LESION;  Surgeon: Jeannette Corpus, MD;  Location: [facility];  Service: Gynecology;  Laterality: N/A;    Medications: Prior to Admission medications   Medication Sig Start Date End Date Taking? Authorizing Provider  alendronate (FOSAMAX) 70 MG tablet Take 70 mg by mouth every 7 (seven) days. On Sunday. Take with a full glass of water on an empty stomach. Patient states she has not taken this medication in the last 2-3 weeks.   Yes Historical Provider, MD  aspirin EC 81 MG tablet Take 81 mg by mouth every morning.   Yes Historical Provider, MD  atorvastatin (LIPITOR) 20 MG tablet Take 20 mg by mouth at bedtime.    Yes Historical Provider, MD  diltiazem (CARDIZEM CD) 240 MG 24 hr capsule Take 240 mg by mouth daily.    Yes Historical Provider, MD  doxazosin (CARDURA) 2 MG tablet Take 0.5 mg by mouth daily.    Yes Historical Provider, MD  indapamide (LOZOL) 1.25 MG tablet Take 1.25 mg by mouth every morning.    Yes  Historical Provider, MD  insulin aspart (NOVOLOG) 100 UNIT/ML injection Inject 14-32 Units into the skin 3 (three) times daily before meals. 16 units in the am, 14 at lunch, and 32 at bedtime   Yes Historical Provider, MD  insulin detemir (LEVEMIR) 100 UNIT/ML injection Inject 16-32 Units into the skin 2 (two) times daily. 32 units at breakfast, 16 in the evening   Yes Historical Provider, MD  levothyroxine (SYNTHROID, LEVOTHROID) 100 MCG tablet Take 100 mcg by mouth every  morning.    Yes Historical Provider, MD  valsartan (DIOVAN) 320 MG tablet Take 320 mg by mouth every morning.    Yes Historical Provider, MD  Vitamin D, Ergocalciferol, (DRISDOL) 50000 UNITS CAPS Take 50,000 Units by mouth every 7 (seven) days. On Mondays.   Yes Historical Provider, MD    Allergies:  No Known Allergies  Social History:  reports that she quit smoking about 44 years ago. Her smoking use included Cigarettes. She has a 4.5 pack-year smoking history. She does not have any smokeless tobacco history on file. She reports that she does not drink alcohol or use illicit drugs. uses a cane, however, has been mostly bedbound last 8 weeks. Lives with husband  Family History: Family History  Problem Relation Age of Onset  . Diabetes type II      Physical Exam: Filed Vitals:   04/21/12 1745 04/21/12 1815 04/21/12 1900 04/21/12 1932  BP: 156/88 136/84 148/71 135/98  Pulse: 132 60 75 95  Temp:    97.5 F (36.4 C)  TempSrc:    Oral  Resp: 10 21 19 18   SpO2: 97% 99% 98% 100%    General:  Alert and oriented times three, well developed and nourished, no acute distress Eyes: PERRLA, pink conjunctiva, no scleral icterus ENT: Dry oral mucosa, neck supple, no thyromegaly Lungs: clear to ascultation, no wheeze, no crackles, no use of accessory muscles Cardiovascular: Irregular rate and rhythm, no regurgitation, no gallops, no murmurs. No carotid bruits, no JVD Abdomen: soft, positive BS, non-tender, non-distended, no organomegaly, not an acute abdomen GU: External exam only. Postsurgical wounds appears well-healed no evidence of infection no drainage Neuro: CN II - XII grossly intact, sensation intact Musculoskeletal: strength 5/5 all extremities, no clubbing, cyanosis or edema Skin: no rash, no subcutaneous crepitation, no decubitus Psych: appropriate patient   Labs on Admission:   [facility] 04/21/12 1734  NA 138  K 3.8  CL 97  CO2 25  GLUCOSE 182*  BUN 20  CREATININE 1.17*    CALCIUM 10.0  MG --  PHOS --  Results for SULLIVAN, BLASING (MRN [id number]) as of 04/21/2012 20:47  Ref. Range 04/21/2012 17:58  Troponin i, poc Latest Range: 0.00-0.08 ng/mL 0.01  Results for DANILLE, OPPEDISANO (MRN [id number]) as of 04/21/2012 20:47  Ref. Range 04/21/2012 17:34  Prothrombin Time Latest Range: 11.6-15.2 seconds 13.6  INR Latest Range: 0.00-1.49  1.02  aPTT Latest Range: 24-37 seconds 29    Basename 04/21/12 1734  AST 20  ALT 18  ALKPHOS 76  BILITOT 0.8  PROT 7.1  ALBUMIN 3.7   No results found for this basename: LIPASE:2,AMYLASE:2 in the last 72 hours  Basename 04/21/12 1734  WBC 9.9  NEUTROABS --  HGB 13.3  HCT 38.3  MCV 92.5  PLT 142*   No results found for this basename: CKTOTAL:3,CKMB:3,CKMBINDEX:3,TROPONINI:3 in the last 72 hours No components found with this basename: POCBNP:3 No results found for this basename: DDIMER:2 in the last 72 hours No results  found for this basename: HGBA1C:2 in the last 72 hours No results found for this basename: CHOL:2,HDL:2,LDLCALC:2,TRIG:2,CHOLHDL:2,LDLDIRECT:2 in the last 72 hours No results found for this basename: TSH,T4TOTAL,FREET3,T3FREE,THYROIDAB in the last 72 hours No results found for this basename: VITAMINB12:2,FOLATE:2,FERRITIN:2,TIBC:2,IRON:2,RETICCTPCT:2 in the last 72 hours  Micro Results: No results found for this or any previous visit (from the past 240 hour(s)).   EKG: A. fib with RVR  Radiological Exams on Admission: Dg Chest Portable 1 View  04/21/2012  *RADIOLOGY REPORT*  Clinical Data: Chest pain and shortness of breath.  Atrial fibrillation.  PORTABLE CHEST - 1 VIEW  Comparison: 02/23/2012.  Findings: Interval enlargement of the cardiac silhouette.  Clear lungs with normal vascularity.  Stable minimal prominence of the interstitial markings.  Diffuse osteopenia.  IMPRESSION:  1.  Interval cardiomegaly. 2.  Stable minimal chronic interstitial lung disease.  Original Report Authenticated By: Darrol Angel,  M.D.    Assessment/Plan Present on Admission:  .Atrial fibrillation with RVR Admit to step down Continue Cardizem drip. continue by mouth Cardizem 3 times daily first 3 times. May need digoxin and  Gentle IV fluid hydration Patient was not on anticoagulation with Coumadin, continue aspirin Deconditioning Physical therapy consult Nausea vomiting Antiemetics ordered, bedside swallow  .Vulvar cancer, carcinoma-recurrent Status post radical vulvectomy Stable patient doing well Diabetes mellitus Hypertension Dyslipidemia Hypothyroidism History of CVA with no residual weakness All stable continue home medications  Full code DVT prophylaxis Team 2/Dr. Nadara Mustard, Taylin Mans 04/21/2012, 8:46 PM

## 2012-04-21 NOTE — ED Provider Notes (Addendum)
History     CSN: [id number]  Arrival date & time 04/21/12  1659   First MD Initiated Contact with Patient 04/21/12 1700      Chief Complaint  Patient presents with  . Atrial Fibrillation  . Weakness     HPI The patient presents to the emergency room with complaints of general weakness since about 1:00 in the morning. Patient states that last night she had some symptoms of mild nausea and vomiting. She denied specifically having any trouble with chest pain or shortness of breath. This morning the symptoms persisted and she continued to feel weak and lightheaded so she called 911. Patient has had a complex medical history recently. She was in the hospital near the end of last year for vulvar surgery associated with cancer. Patient states during the surgery there was some type of cardiac event for which she followed up with the cardiologist.  Patient is not sure what that was but she does take Cardizem now. Patient states she has had a followup surgery in the beginning of march but has been healing well since then. The patient has not felt that her heart was racing.  She has not noticed any swelling. She does have a history of an old stroke and ever since then has had some mild left-sided weakness and facial droop. The patient and her husband states that this is unchanged. Past Medical History  Diagnosis Date  . Diabetes mellitus   . Hypertension   . Hyperlipidemia   . Obesity   . Hypothyroidism   . Vulvar cancer, carcinoma 11/03/2011  . Stroke 05/2000    right brain CVA pre H&P 2001    Past Surgical History  Procedure Date  . Vulvar lesion removal 2012  . Breast surgery     Lumpectomy  . Dilation and curettage of uterus   . Cystourethroplasty / ureteroneocystostomy   . Radical vulvectomy   . Lymphadenectomy 2007    R inguinal femoral  . Radical vulvectomy 02/27/12  . Vulvar lesion removal 02/27/2012    Procedure: VULVAR LESION;  Surgeon: Jeannette Corpus, MD;  Location: [facility]  [facility];  Service: Gynecology;  Laterality: N/A;    History reviewed. No pertinent family history.  History  Substance Use Topics  . Smoking status: Former Smoker -- 0.2 packs/day for 18 years    Types: Cigarettes    Quit date: 11/03/1967  . Smokeless tobacco: Not on file  . Alcohol Use: No    OB History    Grav Para Term Preterm Abortions TAB SAB Ect Mult Living                  Review of Systems  All other systems reviewed and are negative.    Allergies  Review of patient's allergies indicates no known allergies.  Home Medications   Current Outpatient Rx  Name Route Sig Dispense Refill  . ALENDRONATE SODIUM 70 MG PO TABS Oral Take 70 mg by mouth every 7 (seven) days. On Sunday. Take with a full glass of water on an empty stomach.    . ASPIRIN EC 81 MG PO TBEC Oral Take 81 mg by mouth every morning.    . ATORVASTATIN CALCIUM 20 MG PO TABS Oral Take 20 mg by mouth at bedtime.     [name] DILTIAZEM HCL ER COATED BEADS 240 MG PO CP24 Oral Take 240 mg by mouth every morning.     [name] DOXAZOSIN MESYLATE 2 MG PO TABS Oral Take 1 mg by mouth  at bedtime.     . INDAPAMIDE 1.25 MG PO TABS Oral Take 1.25 mg by mouth every morning.     . INSULIN ASPART 100 UNIT/ML [place] SOLN Subcutaneous Inject 14-32 Units into the skin 3 (three) times daily before meals. 16 units in the am, 14 at lunch, and 32 at bedtime    . INSULIN DETEMIR 100 UNIT/ML [place] SOLN Subcutaneous Inject 16-32 Units into the skin 2 (two) times daily. 32 units at breakfast, 16 in the evening    . LEVOTHYROXINE SODIUM 100 MCG PO TABS Oral Take 100 mcg by mouth every morning.     [name] NITROFURANTOIN MACROCRYSTAL 100 MG PO CAPS Oral Take 100 mg by mouth at bedtime.    [name] VALSARTAN 320 MG PO TABS Oral Take 320 mg by mouth every morning.     [name] VITAMIN D (ERGOCALCIFEROL) 50000 UNITS PO CAPS Oral Take 50,000 Units by mouth every 7 (seven) days. On Mondays.      BP 106/74  Pulse 144  Temp(Src) 97.6 F (36.4 C) (Oral)  Resp 16  SpO2  98%  Physical Exam  Nursing note and vitals reviewed. Constitutional: She is oriented to person, place, and time. She appears well-developed and well-nourished. No distress.  HENT:  Head: Normocephalic and atraumatic.  Right Ear: External ear normal.  Left Ear: External ear normal.  Eyes: Conjunctivae are normal. Right eye exhibits no discharge. Left eye exhibits no discharge. No scleral icterus.  Neck: Neck supple. No tracheal deviation present.  Cardiovascular: Intact distal pulses.  An irregularly irregular rhythm present. Tachycardia present.   Pulmonary/Chest: Effort normal and breath sounds normal. No stridor. No respiratory distress. She has no wheezes. She has no rales.  Abdominal: Soft. Bowel sounds are normal. She exhibits no distension. There is no tenderness. There is no rebound and no guarding.  Musculoskeletal: She exhibits no edema and no tenderness.  Neurological: She is alert and oriented to person, place, and time. She has normal strength. She displays no atrophy and no tremor. A cranial nerve deficit ( no gross defecits noted) is present. No sensory deficit. She exhibits normal muscle tone. She displays no seizure activity.       Mild left-sided facial droop  Skin: Skin is warm and dry. No rash noted.  Psychiatric: She has a normal mood and affect.    ED Course  Procedures (including critical care time)  Rate: 139  Rhythm: Atrial fibrillation with rapid ventricular rate  QRS Axis: normal  Intervals: normal  ST/T Wave abnormalities: normal  Conduction Disutrbances:none  Narrative Interpretation:   Old EKG Reviewed: Atrial fibrillation is new since prior EKG dated 20 September 2011  CRITICAL CARE Performed by: Celene Kras Total critical care time: 35 Critical care time was exclusive of separately billable procedures and treating other patients. Critical care was necessary to treat or prevent imminent or life-threatening deterioration. Critical care was time spent  personally by me on the following activities: development of treatment plan with patient and/or surrogate as well as nursing, discussions with consultants, evaluation of patient's response to treatment, examination of patient, obtaining history from patient or surrogate, ordering and performing treatments and interventions, ordering and review of laboratory studies, ordering and review of radiographic studies, pulse oximetry and re-evaluation of patient's condition.   Medications  0.9 %  sodium chloride infusion (1000 mL Intravenous New Bag/Given 04/21/12 1748)  aspirin chewable tablet 324 mg (324 mg Oral Not Given 04/21/12 1753)  diltiazem (CARDIZEM) 100 mg in dextrose 5 % 100  mL infusion (15 mg/hr Intravenous Rate/Dose Change 04/21/12 1838)  diltiazem (CARDIZEM) injection 10 mg (25 mg Intravenous Given 04/21/12 1747)    Labs Reviewed  CBC - Abnormal; Notable for the following:    Platelets 142 (*)    All other components within normal limits  COMPREHENSIVE METABOLIC PANEL - Abnormal; Notable for the following:    Glucose, Bld 182 (*)    Creatinine, Ser 1.17 (*)    GFR calc non Af Amer 42 (*)    GFR calc Af Amer 49 (*)    All other components within normal limits  PROTIME-INR  APTT  POCT I-STAT TROPONIN I   Dg Chest Portable 1 View  04/21/2012  *RADIOLOGY REPORT*  Clinical Data: Chest pain and shortness of breath.  Atrial fibrillation.  PORTABLE CHEST - 1 VIEW  Comparison: 02/23/2012.  Findings: Interval enlargement of the cardiac silhouette.  Clear lungs with normal vascularity.  Stable minimal prominence of the interstitial markings.  Diffuse osteopenia.  IMPRESSION:  1.  Interval cardiomegaly. 2.  Stable minimal chronic interstitial lung disease.  Original Report Authenticated By: Darrol Angel, M.D.     1. Atrial fibrillation with rapid ventricular response       MDM  Old records have been reviewed. On 02/23/2012 EKG showed atrial fibrillation  7:17 PM the patient has responded  well to the Cardizem infusion. Her heart rate has decreased.  Her latest pulse rate was 75. At this time there does not appear to be evidence of acute cardiac injury. There is no evidence of pulmonary edema on her chest x-ray. I suspect her symptoms were related to her uncontrolled atrial fibrillation. I will consult cardiology for admission and further treatment.   D/w Dr Chales Abrahams.  Pt has AS as well.  Pt also complains of some weakness and unsteadiness.  Will add on CT scan for possible a fib induced stroke.  Will consult medicine for admission.  Pt not a TPA candidate, onset symptoms at 0100, outside the appropriate time frame.   Celene Kras, MD 04/21/12 [phone]

## 2012-04-22 ENCOUNTER — Inpatient Hospital Stay (HOSPITAL_COMMUNITY): Payer: Medicare Other

## 2012-04-22 DIAGNOSIS — D72829 Elevated white blood cell count, unspecified: Secondary | ICD-10-CM | POA: Diagnosis present

## 2012-04-22 DIAGNOSIS — J9601 Acute respiratory failure with hypoxia: Secondary | ICD-10-CM | POA: Diagnosis present

## 2012-04-22 LAB — LIPID PANEL
HDL: 54 mg/dL (ref >39)
Total CHOL/HDL Ratio: 2.7 RATIO
VLDL: 26 mg/dL (ref 0–40)

## 2012-04-22 LAB — GLUCOSE, CAPILLARY
Glucose-Capillary: 162 mg/dL — ABNORMAL HIGH (ref 70–99)
Glucose-Capillary: 108 mg/dL — ABNORMAL HIGH (ref 70–99)

## 2012-04-22 LAB — CBC
HCT: 38.7 % (ref 36.0–46.0)
HCT: 38.2 % (ref 36.0–46.0)
Hemoglobin: 13.2 g/dL (ref 12.0–15.0)
MCV: 92.4 fL (ref 78.0–100.0)
MCV: 92.5 fL (ref 78.0–100.0)
RBC: 4.19 MIL/uL (ref 3.87–5.11)
RBC: 4.13 MIL/uL (ref 3.87–5.11)
RDW: 14.4 % (ref 11.5–15.5)
WBC: 16.8 10*3/uL — ABNORMAL HIGH (ref 4.0–10.5)
WBC: 17.3 10*3/uL — ABNORMAL HIGH (ref 4.0–10.5)

## 2012-04-22 LAB — BASIC METABOLIC PANEL
CO2: 27 mEq/L (ref 19–32)
Chloride: 94 mEq/L — ABNORMAL LOW (ref 96–112)
Potassium: 4 mEq/L (ref 3.5–5.1)
Sodium: 135 mEq/L (ref 135–145)

## 2012-04-22 LAB — CREATININE, SERUM: GFR calc Af Amer: 51 mL/min — ABNORMAL LOW (ref >90)

## 2012-04-22 LAB — HEMOGLOBIN A1C
Hgb A1c MFr Bld: 7.5 % — ABNORMAL HIGH (ref <5.7)
Mean Plasma Glucose: 169 mg/dL — ABNORMAL HIGH (ref <117)

## 2012-04-22 MED ORDER — ATORVASTATIN CALCIUM 10 MG PO TABS
10.0000 mg | ORAL_TABLET | Freq: Every day | ORAL | Status: DC
  Filled 2012-04-22: qty 1

## 2012-04-22 MED ORDER — INSULIN ASPART 100 UNIT/ML ~~LOC~~ SOLN
0.0000 [IU] | SUBCUTANEOUS | Status: DC
  Administered 2012-04-23: 3 [IU] via SUBCUTANEOUS
  Administered 2012-04-24: 7 [IU] via SUBCUTANEOUS
  Administered 2012-04-24: 3 [IU] via SUBCUTANEOUS
  Administered 2012-04-25 (×2): 7 [IU] via SUBCUTANEOUS
  Administered 2012-04-25: 11 [IU] via SUBCUTANEOUS
  Administered 2012-04-25 (×2): 4 [IU] via SUBCUTANEOUS

## 2012-04-22 MED ORDER — LEVOTHYROXINE SODIUM 100 MCG IV SOLR
25.0000 ug | Freq: Every day | INTRAVENOUS | Status: DC
  Administered 2012-04-23: 26 ug via INTRAVENOUS
  Filled 2012-04-22 (×2): qty 1.3

## 2012-04-22 MED ORDER — LEVOTHYROXINE SODIUM 100 MCG IV SOLR
50.0000 ug | Freq: Every day | INTRAVENOUS | Status: DC
  Administered 2012-04-22: 50 ug via INTRAVENOUS
  Filled 2012-04-22 (×2): qty 2.5

## 2012-04-22 MED ORDER — METOPROLOL TARTRATE 1 MG/ML IV SOLN
5.0000 mg | Freq: Four times a day (QID) | INTRAVENOUS | Status: DC
  Administered 2012-04-22 – 2012-04-25 (×9): 5 mg via INTRAVENOUS
  Filled 2012-04-22 (×14): qty 5

## 2012-04-22 MED ORDER — ASPIRIN 300 MG RE SUPP
300.0000 mg | Freq: Every day | RECTAL | Status: DC
  Administered 2012-04-22 – 2012-04-23 (×2): 300 mg via RECTAL
  Filled 2012-04-22 (×2): qty 1

## 2012-04-22 MED ORDER — ASPIRIN EC 325 MG PO TBEC
325.0000 mg | DELAYED_RELEASE_TABLET | Freq: Every day | ORAL | Status: DC
  Filled 2012-04-22: qty 1

## 2012-04-22 NOTE — Progress Notes (Signed)
IMPRESSION:  Volume loss and white matter changes. No definite acute  intracranial abnormality.  Hypoattenuation within the inferior left cerebellum, may reflect  volume averaging. If there is clinical concern for acute ischemia,  consider MRI follow-up.  Partially opacified left sphenoid chamber. Correlate clinically if  concern for sinusitis.   A/P: And patient questionable CT head. Given the fact that patient has A. fib with RVR, not on Coumadin MRI has been ordered as well as 2-D echo. At that

## 2012-04-22 NOTE — Progress Notes (Signed)
  Echocardiogram 2D Echocardiogram has been performed.  [name], [name] 04/22/2012, 12:27 PM

## 2012-04-22 NOTE — Progress Notes (Signed)
Cardizem gtt stopped at 1700 due to BP 84/34 HR 60 pt states she is dizzy. MD notified, will continue to monitor.

## 2012-04-22 NOTE — Progress Notes (Signed)
[facility] [place] TEAM 1 - Stepdown/ICU TEAM  PCP:  [name], MD, MD  Subjective: 78 year old female who approximately 8 weeks ago underwent radical vulvectomy for recurrent vulvar cancer. Currently she's not undergoing chemotherapy or radiation therapy. Over the past 8 weeks patient has mostly been sedentary and bedbound per family mostly due to indwelling Foley. The Foley was removed approximately 5 days ago. She has been up in the chair a few times since. She has not had a physical therapy. Last night for the first time she went out to dinner with her husband. Approximately 6 hours later she developed severe nausea and vomiting, no hematemesis. they went to bed, she later wanted to use the bedside commode but she was too weak. She eventually rolled off the bed and fell on the floor.   Today she is awake and alert.  Her speech is noticeably slurred.  She is having trouble with drooling from B corners of her mouth.  She feels unsteady even when sitting up in the bed.  She denies cp, sob, f/c, abdom pain, or focal weakness.    Objective: Intake/Output Summary (Last 24 hours) at 04/22/12 1446 Last data filed at 04/22/12 0701  Gross per 24 hour  Intake  437.5 ml  Output    250 ml  Net  187.5 ml   Blood pressure 109/57, pulse 69, temperature 98.3 F (36.8 C), temperature source Oral, resp. rate 23, height 5\' 4"  (1.626 m), weight 64.7 kg (142 lb 10.2 oz), SpO2 100.00%.  CBG (last 3)   Basename 04/22/12 1224 04/22/12 0758 04/21/12 2154  GLUCAP 184* 281* 212*   Physical Exam: General: No acute respiratory distress Lungs: Clear to auscultation bilaterally without wheezes or crackles Cardiovascular: Regular rate without murmur gallop or rub normal S1 and S2 Abdomen: Nontender, nondistended, soft, bowel sounds positive, no rebound, no ascites, no appreciable mass Extremities: No significant cyanosis, clubbing, or edema bilateral lower extremities Neuro:  Slurred speech, drooling  from corners of mouth B - EOMI - PERRLA - A&Ox4  Lab Results:  [facility] 04/22/12 0405 04/21/12 2305 04/21/12 1734  NA 135 -- 138  K 4.0 -- 3.8  CL 94* -- 97  CO2 27 -- 25  GLUCOSE 234* -- 182*  BUN 18 -- 20  CREATININE 1.17* 1.13* 1.17*  CALCIUM 9.7 -- 10.0  MG -- -- --  PHOS -- -- --    Basename 04/21/12 1734  AST 20  ALT 18  ALKPHOS 76  BILITOT 0.8  PROT 7.1  ALBUMIN 3.7    Basename 04/22/12 0405 04/21/12 2305 04/21/12 1734  WBC 17.3* 16.8* 9.9  NEUTROABS -- -- --  HGB 13.2 13.5 13.3  HCT 38.2 38.7 38.3  MCV 92.5 92.4 92.5  PLT 149* 153 142*    Basename 04/22/12 0405  HGBA1C 7.5*   Micro Results: Recent Results (from the past 240 hour(s))  MRSA PCR SCREENING     Status: Normal   Collection Time   04/21/12 10:37 PM      Component Value Range Status Comment   MRSA by PCR NEGATIVE  NEGATIVE  Final     Studies/Results: All recent x-ray/radiology reports have been reviewed in detail.   Medications: I have reviewed the patient's complete medication list.  Assessment/Plan:  Acute CVA B carotids w/o evidence of signif stenosis - CT head w/o definite acute findings but raises question of possible cerebellar abnormality - echo pending - MRI confirms cerebellar and medullary CVA, likely due to emboli related to afib -  Neuro/Stroke Team has been consulted - begin rehab efforts - may require TEE  N/V Likely due to cerebellar CVA - resolved at this time   afib w/ RVR Previously on ASA alone - rate now controlled - adding plavix as per Neuro - will invite Dr. Anne Fu to comment on ? Coumadin/pradaxa dosing prior to her d/c   Leukocytosis ? Aspiration pneumonitis - no clear/evident source of infection at this time - check CXR in AM - check UA  Low TSH May be over-replaced - will decrease synthroid - goal is TSH ~ 1 - f/u TSH as outpt in 6-8 weeks  Recurrent vulvar CA Stable at this time  DM Poorly controlled - adjust tx plan and follow  HTN Well controlled  at present time  Hx R brain CVA 2001  Lonia Blood, MD [facility] Office  [phone] Pager [phone]  On-Call/Text Page:      [email or web address]      password [facility]

## 2012-04-22 NOTE — Progress Notes (Signed)
VASCULAR LAB PRELIMINARY  PRELIMINARY  PRELIMINARY  PRELIMINARY  Carotid duplex  completed.    Preliminary report:  Bilateral:  No evidence of hemodynamically significant internal carotid artery stenosis.   Vertebral artery flow is antegrade. Right CEA patent.       Terance Hart, RVT 04/22/2012, 12:17 PM

## 2012-04-22 NOTE — Procedures (Signed)
Objective Swallowing Evaluation: Modified Barium Swallowing Study  Patient Details  Name: [name] MRN: [id number] Date of Birth: 17-Mar-1926  Today's Date: 04/22/2012    Past Medical History:  Past Medical History  Diagnosis Date  . Diabetes mellitus   . Hypertension   . Hyperlipidemia   . Obesity   . Hypothyroidism   . Vulvar cancer, carcinoma 11/03/2011  . Stroke 05/2000    right brain CVA pre H&P 2001  . Atrial fibrillation    Past Surgical History:  Past Surgical History  Procedure Date  . Vulvar lesion removal 2012  . Breast surgery     Lumpectomy  . Dilation and curettage of uterus   . Cystourethroplasty / ureteroneocystostomy   . Radical vulvectomy   . Lymphadenectomy 2007    R inguinal femoral  . Radical vulvectomy 02/27/12  . Vulvar lesion removal 02/27/2012    Procedure: VULVAR LESION;  Surgeon: Jeannette Corpus, MD;  Location: [facility];  Service: Gynecology;  Laterality: N/A;   HPI:  78 year old female who approximately 8 weeks ago underwent radical vulvectomy for recurrent vulvar cancer. Currently she's not undergoing chemotherapy or radiation therapy. Over the past 8 weeks patient has mostly been sedentary and bedbound per family mostly due to indwelling Foley. The Foley was removed approximately 5 days ago. She has been up in the chair a few times since. She has not had a physical therapy. Admitted 4/28 with severe nausea and vomiting, no hematemesis, s/p fall on the floor while attempting to utilize commode. In the ER she was found to have A. fib with RVR     Recommendation/Prognosis  Patient present with a severe oropharyngeal dysphagia characterized by base of tongue, laryngeal, and pharyngeal weakness resulting in poor clearance of pharynx and poor airway protection with moderate aspiration of nectar thick liquids. Although pureed solids not aspirated, patient unable to clear pharynx of bolus with majority of bolus remaining within the pyriform sinuses  post swallow. Multiple swallow unsuccessful to clear bolus. Given new onset weakness, poor balance (patient report), hoarse vocal quality, and dysphagia, strongly suspect neurological component to dysphagia. MRI pending following this study. If MRI negative, would recommend consideration of esophageal w/u to further assess cause for severely reduced UES relaxation and clearance of bolus. At this time, patient unsafe for pos. Aspiration risk high with all consistencies.   Swallow Evaluation Recommendations Recommended Consults: Consider esophageal assessment (If MRI negative for acute neurological insult) Diet Recommendations: NPO;Alternative means - long-term Medication Administration: Via alternative means Oral Care Recommendations: Other (Comment) (per protocol-patient NPO) Recommendations for Other Services: Rehab consult Follow up Recommendations: Inpatient Rehab Prognosis Prognosis for Safe Diet Advancement:  (to be determined pending MRI results) Barriers to Reach Goals: Severity of dysphagia Individuals Consulted Consulted and Agree with Results and Recommendations: Patient  Ferdinand Lango MA, CCC-SLP [phone]  Ferdinand Lango [name] 04/22/2012, 2:32 PM

## 2012-04-22 NOTE — Progress Notes (Addendum)
Pt NPO due to Speech Eval this am. Unable to give meds due to increased choking with swallowing.

## 2012-04-22 NOTE — Evaluation (Signed)
Physical Therapy Evaluation Patient Details Name: [name] MRN: [id number] DOB: 08-Jan-1926 Today's Date: 04/22/2012 Time: 4098-1191 PT Time Calculation (min): 41 min  PT Assessment / Plan / Recommendation Clinical Impression  78 y.o. female admitted to [facility] who approximately 8 weeks ago underwent radical vulvectomy for recurrent vulvar cancer.  She presented to ED secondary to decreased ability to walk, and was found to be in A-fib with RVR in the ED.  She presents today with decreased ability to swallow, decreased balance, nystagmus with occulomotor assessment, decreased ability to walk and mobilize safely.  She would benefit from acute PT to maximize her independence, functional mobility and safety so that she may be able to return home with her husband after extensive rehabilitation.      PT Assessment  Patient needs continued PT services    Follow Up Recommendations  Inpatient Rehab    Equipment Recommendations  Defer to next venue    Frequency Min 4X/week    Precautions / Restrictions Precautions Precautions: Fall   Pertinent Vitals/Pain No reports of pain, BP taken x2, no abnormalities (125/51) HR and O2 sats monitored throughout and WNL.        Mobility  Bed Mobility Bed Mobility: Supine to Sit;Sitting - Scoot to Edge of Bed;Sit to Supine Supine to Sit: 5: Supervision;HOB flat;With rails Sitting - Scoot to Edge of Bed: 5: Supervision;With rail Details for Bed Mobility Assistance: supervision for mobility secondary to decreased balance.    Transfers Transfers: Sit to Stand;Stand to [employer] Transfers Sit to Stand: 3: Mod assist;With upper extremity assist;From bed Stand to Sit: 4: Min assist;With armrests;To chair/3-in-1;To bed;With upper extremity assist Stand Pivot Transfers: 4: Min assist Details for Transfer Assistance: mod assist to get to standing secondary to increased sensation that the room was spinning.  We worked on keeping her eyes open and finding  a target to quite the dizziness.  Stood max 30 seconds x 3 trials with RW for support.  R anterior lean.  Min assist to help control descent to sit and min assist to stand pivot to the patient's right side with RW bed to recliner.       PT Goals Acute Rehab PT Goals PT Goal Formulation: With patient/family Time For Goal Achievement: 05/06/12 Potential to Achieve Goals: Good Pt will go Supine/Side to Sit: with modified independence;with HOB 0 degrees PT Goal: Supine/Side to Sit - Progress: Goal set today Pt will Sit at [facility] of Bed: with modified independence;3-5 min;with bilateral upper extremity support PT Goal: Sit at Edge Of Bed - Progress: Goal set today Pt will go Sit to Supine/Side: with modified independence;with HOB 0 degrees PT Goal: Sit to Supine/Side - Progress: Goal set today Pt will go Sit to Stand: with supervision PT Goal: Sit to Stand - Progress: Goal set today Pt will go Stand to Sit: with supervision PT Goal: Stand to Sit - Progress: Goal set today Pt will Transfer Bed to Chair/Chair to Bed: with supervision PT Transfer Goal: Bed to Chair/Chair to Bed - Progress: Goal set today Pt will Stand: with supervision;3 - 5 min;with unilateral upper extremity support PT Goal: Stand - Progress: Goal set today Pt will Ambulate: 51 - 150 feet;with min assist;with rolling walker PT Goal: Ambulate - Progress: Goal set today Pt will Go Up / Down Stairs: 1-2 stairs;with min assist;with least restrictive assistive device PT Goal: Up/Down Stairs - Progress: Goal set today  Visit Information  Last PT Received On: 04/22/12 Assistance Needed: +1 Reason Eval/Treat  Not Completed: Patient not in room    Subjective Data  Subjective: The patient reports that at home all of a sudden she couldn't stand up and she fell over.   Patient Stated Goal: to be independent enough to eventually go home with her husband.     Prior Functioning  Home Living Lives With: Spouse Available Help at  Discharge: Family;Available 24 hours/day (spouse is supervision only, he walks with a cane) Type of Home: House Home Access: Stairs to enter [employer] of Steps: 2 Entrance Stairs-Rails: None Home Layout: Multi-level (split level) Alternate Level Stairs-Number of Steps: 2 Alternate Level Stairs-Rails: Right;Left Bathroom Shower/Tub: Tub/shower unit;Curtain Bathroom Toilet: Standard Bathroom Accessibility: No (cannot get into bathroom with RW, has to use furniture ) Home Adaptive Equipment: Bedside commode/3-in-1;Tub transfer bench;Walker - rolling;Straight cane Additional Comments: prior to admission she would use the straight cane on occations when walking out of the house Prior Function Level of Independence: Independent with assistive device(s) Able to Take Stairs?: Yes Driving: No Communication Communication:  (speech seems a little slurred and diff w/ secreations.  )    Cognition  Overall Cognitive Status: Appears within functional limits for tasks assessed/performed Arousal/Alertness: Awake/alert Behavior During Session: [facility] for tasks performed Cognition - Other Comments: not specifically tested, but conversation normal history reported confirmed by family.      Extremity/Trunk Assessment Right Lower Extremity Assessment RLE ROM/Strength/Tone: Within functional levels Left Lower Extremity Assessment LLE ROM/Strength/Tone: Within functional levels   Balance Balance Balance Assessed: Yes Static Sitting Balance Static Sitting - Balance Support: Bilateral upper extremity supported;Feet supported Static Sitting - Level of Assistance: 5: Stand by assistance Static Sitting - Comment/# of Minutes: 10-15 mins.  cues for finding a target to quiet dizziness and for midline (R lean in sitting).   Static Standing Balance Static Standing - Balance Support: Bilateral upper extremity supported Static Standing - Level of Assistance: 4: Min assist Static Standing - Comment/# of  Minutes: < 30 seconds x 3 with RW   End of Session PT - End of Session Activity Tolerance: Other (comment) (limited by dizzy sensation with mobility.  ) Patient left: in chair;with call bell/phone within reach;with family/visitor present (son and husband) Nurse Communication: Mobility status   Lurena Joiner B. Williard Keller, PT, DPT [phone] 04/22/2012, 5:20 PM

## 2012-04-22 NOTE — Progress Notes (Signed)
Utilization review completed.  

## 2012-04-22 NOTE — Consult Note (Addendum)
Referring Physician: Sharon Seller     Chief Complaint: Difficulty with gait  HPI: [name] is an 78 y.o. female who has been under treatment for recurrent vulvar cancer.  Due to this she has been spending a lot of time in bed.  Went out to eat with her husband on 4/27.  About 6 hours after dinner had acute onset of nausea and vomiting.  She had to go to bed due to weakness and eventually rolled off of the bed.  She was able to be helped back to bed after a time.  Was no better in the morning and presented for evaluation.  Work up included a MRI of the brain that shows an acute inferior left cerebellar and posterior lateral left medullary infarcts.  Patient was also found to be in atrial fibrillation.  LSN: 4/27 tPA Given: No: Outside time window  Past Medical History  Diagnosis Date  . Diabetes mellitus   . Hypertension   . Hyperlipidemia   . Obesity   . Hypothyroidism   . Vulvar cancer, carcinoma 11/03/2011  . Stroke 05/2000    right brain CVA pre H&P 2001  . Atrial fibrillation     Past Surgical History  Procedure Date  . Vulvar lesion removal 2012  . Breast surgery     Lumpectomy  . Dilation and curettage of uterus   . Cystourethroplasty / ureteroneocystostomy   . Radical vulvectomy   . Lymphadenectomy 2007    R inguinal femoral  . Radical vulvectomy 02/27/12  . Vulvar lesion removal 02/27/2012    Procedure: VULVAR LESION;  Surgeon: Jeannette Corpus, MD;  Location: [facility];  Service: Gynecology;  Laterality: N/A;    Family History  Problem Relation Age of Onset  . Diabetes type II     Social History:  reports that she quit smoking about 44 years ago. Her smoking use included Cigarettes. She has a 4.5 pack-year smoking history. She does not have any smokeless tobacco history on file. She reports that she does not drink alcohol or use illicit drugs.  Allergies: No Known Allergies  Medications:  I have reviewed the patient's current medications. Prior to Admission:    Prescriptions prior to admission  Medication Sig Dispense Refill  . alendronate (FOSAMAX) 70 MG tablet Take 70 mg by mouth every 7 (seven) days. On Sunday. Take with a full glass of water on an empty stomach. Patient states she has not taken this medication in the last 2-3 weeks.      [name] aspirin EC 81 MG tablet Take 81 mg by mouth every morning.      [name] atorvastatin (LIPITOR) 20 MG tablet Take 20 mg by mouth at bedtime.       [name] diltiazem (CARDIZEM CD) 240 MG 24 hr capsule Take 240 mg by mouth daily.       [name] doxazosin (CARDURA) 2 MG tablet Take 0.5 mg by mouth daily.       . indapamide (LOZOL) 1.25 MG tablet Take 1.25 mg by mouth every morning.       . insulin aspart (NOVOLOG) 100 UNIT/ML injection Inject 14-32 Units into the skin 3 (three) times daily before meals. 16 units in the am, 14 at lunch, and 32 at bedtime      . insulin detemir (LEVEMIR) 100 UNIT/ML injection Inject 16-32 Units into the skin 2 (two) times daily. 32 units at breakfast, 16 in the evening      . levothyroxine (SYNTHROID, LEVOTHROID) 100 MCG tablet Take  100 mcg by mouth every morning.       . valsartan (DIOVAN) 320 MG tablet Take 320 mg by mouth every morning.       . Vitamin D, Ergocalciferol, (DRISDOL) 50000 UNITS CAPS Take 50,000 Units by mouth every 7 (seven) days. On Mondays.       Scheduled:   . aspirin  324 mg Oral Once  . aspirin EC  325 mg Oral Daily  . atorvastatin  10 mg Oral q1800  . diltiazem  10 mg Intravenous Once  . diltiazem  90 mg Oral Q6H  . doxazosin  0.5 mg Oral Daily  . enoxaparin  40 mg Subcutaneous Q24H  . indapamide  1.25 mg Oral Daily  . insulin aspart  0-15 Units Subcutaneous TID WC  . insulin aspart  0-5 Units Subcutaneous QHS  . insulin aspart  14 Units Subcutaneous TID AC  . insulin detemir  20 Units Subcutaneous BID  . levothyroxine  50 mcg Intravenous Q0600  . olmesartan  20 mg Oral Daily  . sodium chloride  3 mL Intravenous Q12H  . DISCONTD: aspirin EC  81 mg Oral Daily  .  DISCONTD: levothyroxine  100 mcg Oral Q0600  . DISCONTD: simvastatin  20 mg Oral q1800    ROS: History obtained from the patient  General ROS: negative for - chills, fatigue, fever, night sweats, weight gain or weight loss Psychological ROS: negative for - behavioral disorder, hallucinations, memory difficulties, mood swings or suicidal ideation Ophthalmic ROS: negative for - blurry vision, double vision, eye pain or loss of vision ENT ROS: dizziness Allergy and Immunology ROS: negative for - hives or itchy/watery eyes Hematological and Lymphatic ROS: negative for - bleeding problems, bruising or swollen lymph nodes Endocrine ROS: negative for - galactorrhea, hair pattern changes, polydipsia/polyuria or temperature intolerance Respiratory ROS: negative for - cough, hemoptysis, shortness of breath or wheezing Cardiovascular ROS: negative for - chest pain, dyspnea on exertion, edema or irregular heartbeat Gastrointestinal ROS: negative for - abdominal pain, diarrhea, hematemesis, nausea/vomiting or stool incontinence Genito-Urinary ROS: negative for - dysuria, hematuria, incontinence or urinary frequency/urgency Musculoskeletal ROS: negative for - joint swelling or muscular weakness Neurological ROS: as noted in HPI Dermatological ROS: negative for rash and skin lesion changes  Physical Examination: Blood pressure 109/57, pulse 69, temperature 97.4 F (36.3 C), temperature source Oral, resp. rate 23, height 5\' 4"  (1.626 m), weight 64.7 kg (142 lb 10.2 oz), SpO2 98.00%.  Neurologic Examination: Mental Status: Alert, oriented, thought content appropriate.  Speech fluent without evidence of aphasia.  Able to follow 3 step commands without difficulty. Cranial Nerves: II: visual fields grossly normal, pupils equal, round, reactive to light and accommodation III,IV, VI: ptosis not present, extra-ocular motions intact with nystagmus on left lateral gaze V,VII: left facial droop, facial light  touch sensation normal bilaterally VIII: hearing normal bilaterally IX,X: gag reflex present XI: trapezius strength/neck flexion strength normal bilaterally XII: tongue strength normal  Motor: Right : Upper extremity   5/5    Left:     Upper extremity   5-/5  Lower extremity   5/5     Lower extremity   5/5 Tone and bulk:normal tone throughout; no atrophy noted Sensory: Pinprick and light touch intact throughout, bilaterally Deep Tendon Reflexes: 2+ and symmetric with absent AJ's bilaterally Plantars: Right: mute   Left: mute Cerebellar: dysmetric finger-to-nose on the left, and normal heel-to-shin test   Results for orders placed during the hospital encounter of 04/21/12 (from the past 48  hour(s))  CBC     Status: Abnormal   Collection Time   04/21/12  5:34 PM      Component Value Range Comment   WBC 9.9  4.0 - 10.5 (K/uL)    RBC 4.14  3.87 - 5.11 (MIL/uL)    Hemoglobin 13.3  12.0 - 15.0 (g/dL)    HCT 29.5  28.4 - 13.2 (%)    MCV 92.5  78.0 - 100.0 (fL)    MCH 32.1  26.0 - 34.0 (pg)    MCHC 34.7  30.0 - 36.0 (g/dL)    RDW 44.0  10.2 - 72.5 (%)    Platelets 142 (*) 150 - 400 (K/uL)   COMPREHENSIVE METABOLIC PANEL     Status: Abnormal   Collection Time   04/21/12  5:34 PM      Component Value Range Comment   Sodium 138  135 - 145 (mEq/L)    Potassium 3.8  3.5 - 5.1 (mEq/L)    Chloride 97  96 - 112 (mEq/L)    CO2 25  19 - 32 (mEq/L)    Glucose, Bld 182 (*) 70 - 99 (mg/dL)    BUN 20  6 - 23 (mg/dL)    Creatinine, Ser 3.66 (*) 0.50 - 1.10 (mg/dL)    Calcium 44.0  8.4 - 10.5 (mg/dL)    Total Protein 7.1  6.0 - 8.3 (g/dL)    Albumin 3.7  3.5 - 5.2 (g/dL)    AST 20  0 - 37 (U/L)    ALT 18  0 - 35 (U/L)    Alkaline Phosphatase 76  39 - 117 (U/L)    Total Bilirubin 0.8  0.3 - 1.2 (mg/dL)    GFR calc non Af Amer 42 (*) >90 (mL/min)    GFR calc Af Amer 49 (*) >90 (mL/min)   PROTIME-INR     Status: Normal   Collection Time   04/21/12  5:34 PM      Component Value Range Comment     Prothrombin Time 13.6  11.6 - 15.2 (seconds)    INR 1.02  0.00 - 1.49    APTT     Status: Normal   Collection Time   04/21/12  5:34 PM      Component Value Range Comment   aPTT 29  24 - 37 (seconds)   POCT I-STAT TROPONIN I     Status: Normal   Collection Time   04/21/12  5:58 PM      Component Value Range Comment   Troponin i, poc 0.01  0.00 - 0.08 (ng/mL)    Comment 3            GLUCOSE, CAPILLARY     Status: Abnormal   Collection Time   04/21/12  9:54 PM      Component Value Range Comment   Glucose-Capillary 212 (*) 70 - 99 (mg/dL)    Comment 1 Notify RN      Comment 2 Documented in Chart     MRSA PCR SCREENING     Status: Normal   Collection Time   04/21/12 10:37 PM      Component Value Range Comment   MRSA by PCR NEGATIVE  NEGATIVE    CBC     Status: Abnormal   Collection Time   04/21/12 11:05 PM      Component Value Range Comment   WBC 16.8 (*) 4.0 - 10.5 (K/uL)    RBC 4.19  3.87 - 5.11 (MIL/uL)  Hemoglobin 13.5  12.0 - 15.0 (g/dL)    HCT 16.1  09.6 - 04.5 (%)    MCV 92.4  78.0 - 100.0 (fL)    MCH 32.2  26.0 - 34.0 (pg)    MCHC 34.9  30.0 - 36.0 (g/dL)    RDW 40.9  81.1 - 91.4 (%)    Platelets 153  150 - 400 (K/uL)   CREATININE, SERUM     Status: Abnormal   Collection Time   04/21/12 11:05 PM      Component Value Range Comment   Creatinine, Ser 1.13 (*) 0.50 - 1.10 (mg/dL)    GFR calc non Af Amer 44 (*) >90 (mL/min)    GFR calc Af Amer 51 (*) >90 (mL/min)   TSH     Status: Abnormal   Collection Time   04/22/12  4:05 AM      Component Value Range Comment   TSH 0.290 (*) 0.350 - 4.500 (uIU/mL)   BASIC METABOLIC PANEL     Status: Abnormal   Collection Time   04/22/12  4:05 AM      Component Value Range Comment   Sodium 135  135 - 145 (mEq/L)    Potassium 4.0  3.5 - 5.1 (mEq/L)    Chloride 94 (*) 96 - 112 (mEq/L)    CO2 27  19 - 32 (mEq/L)    Glucose, Bld 234 (*) 70 - 99 (mg/dL)    BUN 18  6 - 23 (mg/dL)    Creatinine, Ser 7.82 (*) 0.50 - 1.10 (mg/dL)     Calcium 9.7  8.4 - 10.5 (mg/dL)    GFR calc non Af Amer 42 (*) >90 (mL/min)    GFR calc Af Amer 49 (*) >90 (mL/min)   CBC     Status: Abnormal   Collection Time   04/22/12  4:05 AM      Component Value Range Comment   WBC 17.3 (*) 4.0 - 10.5 (K/uL)    RBC 4.13  3.87 - 5.11 (MIL/uL)    Hemoglobin 13.2  12.0 - 15.0 (g/dL)    HCT 95.6  21.3 - 08.6 (%)    MCV 92.5  78.0 - 100.0 (fL)    MCH 32.0  26.0 - 34.0 (pg)    MCHC 34.6  30.0 - 36.0 (g/dL)    RDW 57.8  46.9 - 62.9 (%)    Platelets 149 (*) 150 - 400 (K/uL)   LIPID PANEL     Status: Normal   Collection Time   04/22/12  4:05 AM      Component Value Range Comment   Cholesterol 145  0 - 200 (mg/dL)    Triglycerides 528  <150 (mg/dL)    HDL 54  >41 (mg/dL)    Total CHOL/HDL Ratio 2.7      VLDL 26  0 - 40 (mg/dL)    LDL Cholesterol 65  0 - 99 (mg/dL)   HEMOGLOBIN L2G     Status: Abnormal   Collection Time   04/22/12  4:05 AM      Component Value Range Comment   Hemoglobin A1C 7.5 (*) <5.7 (%)    Mean Plasma Glucose 169 (*) <117 (mg/dL)   GLUCOSE, CAPILLARY     Status: Abnormal   Collection Time   04/22/12  7:58 AM      Component Value Range Comment   Glucose-Capillary 281 (*) 70 - 99 (mg/dL)    Comment 1 Notify RN     GLUCOSE, CAPILLARY  Status: Abnormal   Collection Time   04/22/12 12:24 PM      Component Value Range Comment   Glucose-Capillary 184 (*) 70 - 99 (mg/dL)    Comment 1 Notify RN      Ct Head Wo Contrast  04/21/2012  *RADIOLOGY REPORT*  Clinical Data: Fall, weakness, trauma to the back of head.  CT HEAD WITHOUT CONTRAST  Technique:  Contiguous axial images were obtained from the base of the skull through the vertex without contrast.  Comparison: 03/25/2004  Findings: Prominence of the sulci, cisterns, and ventricles, in keeping with volume loss. There are subcortical and periventricular white matter hypodensities, a nonspecific finding most often seen with chronic microangiopathic changes.  There is no evidence for  acute hemorrhage, overt hydrocephalus, mass lesion, or abnormal extra-axial fluid collection.  No definite CT evidence for acute cortical based (large artery) infarction. Remote lacunar infarction centered with the right basal ganglia. Hypoattenuation within the inferior left cerebellum may reflect volume averaging.  No associated mass effect.  Partially opacified left sphenoid chamber.  The visualized paranasal sinuses and mastoid air cells are otherwise predominately clear.  No displaced calvarial fracture.  IMPRESSION: Volume loss and white matter changes.  No definite acute intracranial abnormality.  Hypoattenuation within the inferior left cerebellum, may reflect volume averaging. If there is clinical concern for acute ischemia, consider MRI follow-up.  Partially opacified left sphenoid chamber.  Correlate clinically if concern for sinusitis.  Original Report Authenticated By: Waneta Martins, M.D.   Mri Brain Without Contrast  04/22/2012  *RADIOLOGY REPORT*  Clinical Data:  Fall.  Weakness.  Abnormal head CT.  MRI HEAD WITHOUT CONTRAST MRA HEAD WITHOUT CONTRAST  Technique: Multiplanar, multiecho pulse sequences of the brain and surrounding structures were obtained according to standard protocol without intravenous contrast.  Angiographic images of the head were obtained using MRA technique without contrast.  Comparison: 04/21/2012 head CT.  MRI HEAD  Findings:  Moderate sized acute non hemorrhagic infarct inferior left cerebellum and posterior lateral aspect of the left medulla.  Small focal area of blood breakdown products right anterior lower pons may be related to the prior hemorrhagic ischemia.  Small cavernoma not excluded.  Result of trauma felt to be less likely consideration otherwise no intracranial hemorrhage noted.  Remote tiny l right thalamic infarct and right lenticular nucleus infarct.  Mild to moderate small vessel disease type changes.  Global atrophy without hydrocephalus.  No  intracranial mass lesion detected on this unenhanced exam.  Abnormal left vertebral artery.  Please see below.  Mild paranasal sinus mucosal thickening.  IMPRESSION: Moderate size acute non hemorrhagic infarct inferior left cerebellum and posterior lateral aspect of the left medulla.  MRA HEAD  Findings: Anterior circulation without medium or large size vessel significant stenosis or occlusion.  Mild branch vessel irregularity.  Nonvisualization of the left vertebral artery and left PICA. Dissection not excluded.  Mild irregularity without high-grade stenosis of the right vertebral artery or basilar artery.  Nonvisualization right PICA.  Small left AICA.  Bulge of the proximal left lateral basilar artery may represent the insertion site of the occluded left vertebral artery.  Posterior cerebral artery moderate tandem stenoses more notable on the right.  No aneurysm or vascular malformation noted.  IMPRESSION: Nonvisualization left vertebral artery and both PICAs.  Please see above.  Original Report Authenticated By: Fuller Canada, M.D.   Dg Chest Portable 1 View  04/21/2012  *RADIOLOGY REPORT*  Clinical Data: Chest pain and shortness of breath.  Atrial fibrillation.  PORTABLE CHEST - 1 VIEW  Comparison: 02/23/2012.  Findings: Interval enlargement of the cardiac silhouette.  Clear lungs with normal vascularity.  Stable minimal prominence of the interstitial markings.  Diffuse osteopenia.  IMPRESSION:  1.  Interval cardiomegaly. 2.  Stable minimal chronic interstitial lung disease.  Original Report Authenticated By: Darrol Angel, M.D.   Mr Mra Head/brain Wo Cm  04/22/2012  *RADIOLOGY REPORT*  Clinical Data:  Fall.  Weakness.  Abnormal head CT.  MRI HEAD WITHOUT CONTRAST MRA HEAD WITHOUT CONTRAST  Technique: Multiplanar, multiecho pulse sequences of the brain and surrounding structures were obtained according to standard protocol without intravenous contrast.  Angiographic images of the head were obtained  using MRA technique without contrast.  Comparison: 04/21/2012 head CT.  MRI HEAD  Findings:  Moderate sized acute non hemorrhagic infarct inferior left cerebellum and posterior lateral aspect of the left medulla.  Small focal area of blood breakdown products right anterior lower pons may be related to the prior hemorrhagic ischemia.  Small cavernoma not excluded.  Result of trauma felt to be less likely consideration otherwise no intracranial hemorrhage noted.  Remote tiny l right thalamic infarct and right lenticular nucleus infarct.  Mild to moderate small vessel disease type changes.  Global atrophy without hydrocephalus.  No intracranial mass lesion detected on this unenhanced exam.  Abnormal left vertebral artery.  Please see below.  Mild paranasal sinus mucosal thickening.  IMPRESSION: Moderate size acute non hemorrhagic infarct inferior left cerebellum and posterior lateral aspect of the left medulla.  MRA HEAD  Findings: Anterior circulation without medium or large size vessel significant stenosis or occlusion.  Mild branch vessel irregularity.  Nonvisualization of the left vertebral artery and left PICA. Dissection not excluded.  Mild irregularity without high-grade stenosis of the right vertebral artery or basilar artery.  Nonvisualization right PICA.  Small left AICA.  Bulge of the proximal left lateral basilar artery may represent the insertion site of the occluded left vertebral artery.  Posterior cerebral artery moderate tandem stenoses more notable on the right.  No aneurysm or vascular malformation noted.  IMPRESSION: Nonvisualization left vertebral artery and both PICAs.  Please see above.  Original Report Authenticated By: Fuller Canada, M.D.    Assessment: 78 y.o. female presenting with difficulty with gait and a left cerebellar and medullary infarct.  Atrial fibrillation noted at presentation.  Patient on an aspirin at home prior to presentation.    Stroke Risk Factors - atrial  fibrillation, diabetes mellitus, hyperlipidemia and hypertension  Plan: 1. HgbA1c, fasting lipid panel 2. PT consult, OT consult, Speech consult 3. Echocardiogram 4. Carotid dopplers 5. Prophylactic therapy-Antiplatelet med: Plavix - dose 75mg  daily.  Would decrease ASA to 81mg .  Based on how safe patient becomes with ambulation will determine if she is a Coumadin candidate.   6. Risk factor modification 7. Telemetry monitoring 8. Frequent neuro checks     Thana Farr, MD [facility] [phone] 04/22/2012, 5:33 PM

## 2012-04-22 NOTE — Evaluation (Addendum)
Clinical/Bedside Swallow Evaluation Patient Details  Name: [name] MRN: [id number] Date of Birth: 11/15/1926  Today's Date: 04/22/2012  Time In/Out:  9629-5284  Past Medical History:  Past Medical History  Diagnosis Date  . Diabetes mellitus   . Hypertension   . Hyperlipidemia   . Obesity   . Hypothyroidism   . Vulvar cancer, carcinoma 11/03/2011  . Stroke 05/2000    right brain CVA pre H&P 2001  . Atrial fibrillation    Past Surgical History:  Past Surgical History  Procedure Date  . Vulvar lesion removal 2012  . Breast surgery     Lumpectomy  . Dilation and curettage of uterus   . Cystourethroplasty / ureteroneocystostomy   . Radical vulvectomy   . Lymphadenectomy 2007    R inguinal femoral  . Radical vulvectomy 02/27/12  . Vulvar lesion removal 02/27/2012    Procedure: VULVAR LESION;  Surgeon: Jeannette Corpus, MD;  Location: [facility];  Service: Gynecology;  Laterality: N/A;   HPI:  78 year old female who approximately 8 weeks ago underwent radical vulvectomy for recurrent vulvar cancer. Currently she's not undergoing chemotherapy or radiation therapy. Over the past 8 weeks patient has mostly been sedentary and bedbound per family mostly due to indwelling Foley. The Foley was removed approximately 5 days ago. She has been up in the chair a few times since. She has not had a physical therapy. Admitted 4/28 with severe nausea and vomiting, no hematemesis, s/p fall on the floor while attempting to utilize commode. In the ER she was found to have A. fib with RVR   Assessment / Plan / Recommendation Clinical Impression  Patient presents with clinical indicators of both a pharyngeal and esophageal dysphagia characterized by what appears to be severe aspiration with thin liquids marked by immediate, violent wet coughing and questionable regurgitation of bolus upon each presentation. Dysfunction initially thought to be primarily esophageal in nature however acute hoarse vocal  quality, oral weakness, and decreased hyo-laryngeal elevation raise concern for neuro based oropharyngeal dysphagia as well. Objective evaluation warranted prior to initiation of a po diet.     Aspiration Risk  Severe    Diet Recommendation NPO   Other  Recommendations MBS   Follow Up Recommendations  Other (comment) (to be determined pending MBS)    Frequency and Duration     TBD   Pertinent Vitals/Pain No pain    SLP Swallow Goals  to be determined pending objective testing   Swallow Study Prior Functional Status       General HPI: 78 year old female who approximately 8 weeks ago underwent radical vulvectomy for recurrent vulvar cancer. Currently she's not undergoing chemotherapy or radiation therapy. Over the past 8 weeks patient has mostly been sedentary and bedbound per family mostly due to indwelling Foley. The Foley was removed approximately 5 days ago. She has been up in the chair a few times since. She has not had a physical therapy. Admitted 4/28 with severe nausea and vomiting, no hematemesis, s/p fall on the floor while attempting to utilize commode. In the ER she was found to have A. fib with RVR Type of Study: Bedside swallow evaluation Diet Prior to this Study: NPO (previously on a regular diet prior to admission) Temperature Spikes Noted: No Respiratory Status: Supplemental O2 delivered via (comment) (2 L) Behavior/Cognition: Alert;Cooperative;Pleasant mood Oral Cavity - Dentition: Dentures, top;Missing dentition (missing bottom dentition) Vision: Functional for self-feeding Patient Positioning: Upright in bed Baseline Vocal Quality: Hoarse Volitional Cough: Weak;Congested Volitional Swallow: Able  to elicit    Oral/Motor/Sensory Function Overall Oral Motor/Sensory Function: Impaired Labial ROM: Reduced left (husband reports baseline left sided labial assymmetry) Labial Symmetry: Abnormal symmetry left (husband reports baseline left sided labial  assymmetry) Labial Strength: Reduced Labial Sensation: Within Functional Limits Lingual ROM: Within Functional Limits Lingual Symmetry: Abnormal symmetry right (right deviation) Lingual Strength: Within Functional Limits Lingual Sensation: Within Functional Limits Facial ROM: Reduced left Facial Symmetry: Within Functional Limits Facial Strength: Within Functional Limits Facial Sensation: Within Functional Limits Velum: Within Functional Limits Mandible: Within Functional Limits   Ice Chips Ice chips: Impaired Presentation: Spoon Oral Phase Impairments: Reduced labial seal Pharyngeal Phase Impairments: Throat Clearing - Delayed;Cough - Delayed;Wet Vocal Quality;Decreased hyoid-laryngeal movement;Delayed Swallow   Thin Liquid Thin Liquid: Impaired Presentation: Cup;Self Fed Oral Phase Impairments: Reduced labial seal;Impaired anterior to posterior transit Oral Phase Functional Implications: Right anterior spillage;Left anterior spillage Pharyngeal  Phase Impairments: Delayed Swallow;Decreased hyoid-laryngeal movement;Multiple swallows;Wet Vocal Quality;Throat Clearing - Immediate;Cough - Immediate (violent, wet cough noted immediately with ? regurgitation)    Nectar Thick Nectar Thick Liquid: Not tested   Honey Thick Honey Thick Liquid: Not tested   Puree Puree: Not tested   Solid Solid: Not tested    Patsie Mccardle [name] 04/22/2012,11:05 AM

## 2012-04-22 NOTE — Progress Notes (Signed)
PT Cancellation Note  Treatment cancelled today due to patient receiving procedure or test.  The patient is out of the room. PT to check back later today or tomorrow as time allows to complete mobility evaluation.    Rollene Rotunda Shalamar Crays, PT, DPT [phone] 04/22/2012, 1:59 PM

## 2012-04-22 NOTE — Progress Notes (Signed)
Pt is on Diltiazem and new order for simvastatin 20mg  has been added this admission.  Max Simvastatin dose in combination with diltiazem is 10mg  per day due to an increased risk of rhabdomyelisis.  I have changed to equivalent Lipitor 10mg  dose per P&T policy.  [employer], Pharm.D., BCPS Clinical Pharmacist Pager [phone] 04/22/2012 11:39 AM

## 2012-04-23 ENCOUNTER — Telehealth: Payer: Self-pay | Admitting: Gynecologic Oncology

## 2012-04-23 ENCOUNTER — Inpatient Hospital Stay (HOSPITAL_COMMUNITY): Payer: Medicare Other

## 2012-04-23 DIAGNOSIS — I4891 Unspecified atrial fibrillation: Secondary | ICD-10-CM

## 2012-04-23 DIAGNOSIS — I6330 Cerebral infarction due to thrombosis of unspecified cerebral artery: Secondary | ICD-10-CM

## 2012-04-23 DIAGNOSIS — I639 Cerebral infarction, unspecified: Secondary | ICD-10-CM | POA: Diagnosis present

## 2012-04-23 LAB — GLUCOSE, CAPILLARY
Glucose-Capillary: 116 mg/dL — ABNORMAL HIGH (ref 70–99)
Glucose-Capillary: 135 mg/dL — ABNORMAL HIGH (ref 70–99)

## 2012-04-23 LAB — BASIC METABOLIC PANEL
BUN: 24 mg/dL — ABNORMAL HIGH (ref 6–23)
Calcium: 9.2 mg/dL (ref 8.4–10.5)
Creatinine, Ser: 1.55 mg/dL — ABNORMAL HIGH (ref 0.50–1.10)
GFR calc Af Amer: 35 mL/min — ABNORMAL LOW (ref >90)
GFR calc non Af Amer: 30 mL/min — ABNORMAL LOW (ref >90)

## 2012-04-23 LAB — CBC
MCHC: 33.4 g/dL (ref 30.0–36.0)
RDW: 14.5 % (ref 11.5–15.5)

## 2012-04-23 MED ORDER — SIMVASTATIN 20 MG PO TABS
20.0000 mg | ORAL_TABLET | Freq: Every day | ORAL | Status: DC
  Filled 2012-04-23 (×2): qty 1

## 2012-04-23 MED ORDER — ASPIRIN 81 MG PO CHEW
81.0000 mg | CHEWABLE_TABLET | Freq: Every day | ORAL | Status: DC
  Administered 2012-04-24 – 2012-04-25 (×2): 81 mg
  Filled 2012-04-23 (×2): qty 1

## 2012-04-23 MED ORDER — LEVOTHYROXINE SODIUM 100 MCG PO TABS
100.0000 ug | ORAL_TABLET | Freq: Every day | ORAL | Status: DC
  Administered 2012-04-25: 100 ug
  Filled 2012-04-23 (×3): qty 1

## 2012-04-23 NOTE — Progress Notes (Signed)
Attempted to place panda at bedside twice; unsuccessful both times due to patient fighting insertion and unbale to relax or swallow for tube to be advanced.

## 2012-04-23 NOTE — Progress Notes (Signed)
Speech Language Pathology Dysphagia Treatment Patient Details Name: [name] MRN: [id number] DOB: 07-17-1926 Today's Date: 04/23/2012 Time: 4098-1191 SLP Time Calculation (min): 35 min  Assessment / Plan / Recommendation Clinical Impression  Treatment focused on pharyngeal strengthening and education. 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 Reviewed impact of CVA on swallowing with both patient and husband. Educated patient and husband on need for thorough oral care per protocol while patient NPO to decrease risk of aspiration related PNA as patient likely aspirating secretions at this time based on wet vocal quality noted and intermittent need to expectorate saliva. Patient able complete self oral care with mod tactile assist for proper use of yankauer. Encouraged use of effortful swallow at all times in attempts to swallow secretions, but particularly following through oral care to faciliate safe use of and strengthening of oropharyngeal musculature. Patient able to return demonstration of effortful swallow  x 10 with min verbal cues. Patient may use green dental swabs dipped in clean water after oral care to faciliate oral saliva for improved ability to dry swallow. Suspect based on location of CVA and severity of dysphagia that patient will require at least temporary non-oral means of nutrition. Continue to recommend CIR for intense dysphagia treatment with consideration of NMES for pharyngeal strengthening. SLP will continue to f/u for traditional pharyngeal strengthening and education on the acute level.      Diet Recommendation  Continue with Current Diet: NPO    SLP Plan Continue with current plan of care   Pertinent Vitals/Pain n/a   Swallowing Goals  SLP Swallowing Goals Goal #3: Patient will complete effortful swallow x 15-20 with min clinician cues. Swallow Study Goal #3 - Progress: Progressing toward  goal  General Temperature Spikes Noted: No Respiratory Status: Supplemental O2 delivered via (comment) (2 L nasal cannula) Behavior/Cognition: Alert;Cooperative;Pleasant mood Oral Cavity - Dentition: Dentures, top;Missing dentition Patient Positioning: Upright in bed  Oral Cavity - Oral Hygiene Does patient have any of the following "at risk" factors?: Nutritional status - inadequate Patient is HIGH RISK - Oral Care Protocol followed (see row info): Yes   Dysphagia Treatment Treatment focused on: Patient/family/caregiver education;Facilitation of pharyngeal phase;Utilization of compensatory strategies Family/Caregiver Educated: husband Treatment Methods/Modalities: Effortful swallow Patient observed directly with PO's: No Reason PO's not observed: Other (comment) (NPO secondary to severe dysphagia) Pharyngeal Phase Signs & Symptoms: Multiple swallows;Delayed throat clear;Other (comment) (wet vocal quality, coughing up thin secretions) Type of cueing: Verbal Amount of cueing: Moderate   Lavilla Delamora [name] 04/23/2012, 2:24 PM

## 2012-04-23 NOTE — Discharge Instructions (Signed)
STROKE/TIA DISCHARGE INSTRUCTIONS SMOKING Cigarette smoking nearly doubles your risk of having a stroke & is the single most alterable risk factor  If you smoke or have smoked in the last 12 months, you are advised to quit smoking for your health.  Most of the excess cardiovascular risk related to smoking disappears within a year of stopping.  Ask you doctor about anti-smoking medications  [place] Quit Line: 1-800-QUIT NOW  Free Smoking Cessation Classes [phone]  CHOLESTEROL Know your levels; limit fat & cholesterol in your diet  Lipid Panel     Component Value Date/Time   CHOL 145 04/22/2012 0405   TRIG 129 04/22/2012 0405   HDL 54 04/22/2012 0405   CHOLHDL 2.7 04/22/2012 0405   VLDL 26 04/22/2012 0405   LDLCALC 65 04/22/2012 0405      Many patients benefit from treatment even if their cholesterol is at goal.  Goal: Total Cholesterol (CHOL) less than 160  Goal:  Triglycerides (TRIG) less than 150  Goal:  HDL greater than 40  Goal:  LDL (LDLCALC) less than 100   BLOOD PRESSURE American Stroke Association blood pressure target is less that 120/80 mm/Hg  Your discharge blood pressure is:  BP: 142/57 mmHg  Monitor your blood pressure  Limit your salt and alcohol intake  Many individuals will require more than one medication for high blood pressure  DIABETES (A1c is a blood sugar average for last 3 months) Goal HGBA1c is under 7% (HBGA1c is blood sugar average for last 3 months)  Diabetes: {STROKE DC DIABETES:22357}    Lab Results  Component Value Date   HGBA1C 7.5* 04/22/2012     Your HGBA1c can be lowered with medications, healthy diet, and exercise.  Check your blood sugar as directed by your physician  Call your physician if you experience unexplained or low blood sugars.  PHYSICAL ACTIVITY/REHABILITATION Goal is 30 minutes at least 4 days per week    {STROKE DC ACTIVITY/REHAB:22359}  Activity decreases your risk of heart attack and stroke and makes your heart  stronger.  It helps control your weight and blood pressure; helps you relax and can improve your mood.  Participate in a regular exercise program.  Talk with your doctor about the best form of exercise for you (dancing, walking, swimming, cycling).  DIET/WEIGHT Goal is to maintain a healthy weight  Your discharge diet is: NPO *** liquids Your height is:  Height: 5\' 4"  (162.6 cm) Your current weight is: Weight: 64.7 kg (142 lb 10.2 oz) Your Body Mass Index (BMI) is:  BMI (Calculated): 24.5   Following the type of diet specifically designed for you will help prevent another stroke.  Your goal weight range is:  ***  Your goal Body Mass Index (BMI) is 19-24.  Healthy food habits can help reduce 3 risk factors for stroke:  High cholesterol, hypertension, and excess weight.  RESOURCES Stroke/Support Group:  Call [phone]  they meet the 3rd Sunday of the month on the Rehab Unit at [facility], [place] ( no meetings June, July & Aug).  STROKE EDUCATION PROVIDED/REVIEWED AND GIVEN TO PATIENT Stroke warning signs and symptoms How to activate emergency medical system (call 911). Medications prescribed at discharge. Need for follow-up after discharge. Personal risk factors for stroke. Pneumonia vaccine given:   {STROKE DC YES/NO/DATE:22363} Flu vaccine given:   {STROKE DC YES/NO/DATE:22363} My questions have been answered, the writing is legible, and I understand these instructions.  I will adhere to these goals & educational materials that have  been provided to me after my discharge from the hospital.

## 2012-04-23 NOTE — Progress Notes (Signed)
Physical Therapy Treatment Patient Details Name: [name] MRN: [id number] DOB: 1926/08/30 Today's Date: 04/23/2012 Time: 7829-5621 PT Time Calculation (min): 20 min  PT Assessment / Plan / Recommendation Comments on Treatment Session  Pt progressing in that she was able to ambulate today, although still with feeligns of diziness with motion. For safety purposes, will keep ambulation short distances with a chair follow. Continue per plan    Follow Up Recommendations  Inpatient Rehab    Equipment Recommendations  Defer to next venue    Frequency Min 4X/week   Plan Discharge plan remains appropriate;Frequency remains appropriate    Precautions / Restrictions Precautions Precautions: Fall Restrictions Weight Bearing Restrictions: No   Pertinent Vitals/Pain BP WNL following ambulation.     Mobility  Bed Mobility Bed Mobility: Supine to Sit;Sitting - Scoot to Edge of Bed;Sit to Supine Supine to Sit: 5: Supervision;HOB flat;With rails Sitting - Scoot to Edge of Bed: 5: Supervision;With rail Sit to Supine: 3: Mod assist;With rail;HOB flat Details for Bed Mobility Assistance: supervision for mobility secondary to decreased balance.  Mod assist for return into bed as pt was feeling woozy and required assist to quickly get back into lying Transfers Transfers: Sit to Stand;Stand to Sit;Stand Pivot Transfers Sit to Stand: 4: Min assist;With upper extremity assist;From bed Stand to Sit: 2: Max assist;With upper extremity assist;To bed Details for Transfer Assistance: Min assist for stand for stability. Max assist required to sit as pt was unable to control after ambulation and dizziness. PT controlled descent safely Ambulation/Gait Ambulation/Gait Assistance: 4: Min assist Ambulation Distance (Feet): 5 Feet Assistive device: Rolling walker Ambulation/Gait Assistance Details: VC for safety with RW as well as proper cueing. Pt with no complaints at first, and then instant complaints of  dizziness and need to sit down immediately. Pt carefully assisted onto edge of bed with max assist for control Gait Pattern: Step-to pattern;Decreased hip/knee flexion - left;Decreased hip/knee flexion - right;Decreased stride length;Narrow base of support Gait velocity: decreased gait speed    Exercises     PT Goals Acute Rehab PT Goals PT Goal Formulation: With patient/family PT Goal: Supine/Side to Sit - Progress: Progressing toward goal PT Goal: Sit at Edge Of Bed - Progress: Progressing toward goal PT Goal: Sit to Supine/Side - Progress: Progressing toward goal PT Goal: Sit to Stand - Progress: Progressing toward goal PT Goal: Stand to Sit - Progress: Progressing toward goal PT Transfer Goal: Bed to Chair/Chair to Bed - Progress: Progressing toward goal PT Goal: Stand - Progress: Progressing toward goal PT Goal: Ambulate - Progress: Progressing toward goal  Visit Information  Last PT Received On: 04/23/12    Subjective Data      Cognition  Overall Cognitive Status: Appears within functional limits for tasks assessed/performed Arousal/Alertness: Awake/alert Orientation Level: Oriented X4 / Intact Behavior During Session: [facility] for tasks performed Cognition - Other Comments: not specifically tested, but conversation normal history reported confirmed by family.      Balance     End of Session PT - End of Session Equipment Utilized During Treatment: Gait belt Activity Tolerance: Treatment limited secondary to medical complications (Comment) (dizziness with mobility and feeling of passing out) Patient left: in bed;with call bell/phone within reach;with nursing in room Nurse Communication: Mobility status    Milana Kidney 04/23/2012, 5:08 PM

## 2012-04-23 NOTE — Progress Notes (Signed)
  Pt's blood pressure @ 2100 is 183/75 (186/72 manual). Pt does have metoprolol ordered for 0000 o'clock. Paged provider on call Craige Cotta) at 2148 first time and 2203 second time, no call back received. Will continue to monitor and page provider again.

## 2012-04-23 NOTE — Telephone Encounter (Signed)
Called to speak with [name] about her current status and hospitalization.  Pt stating that she "had a stroke in the back of my brain and I can't swallow."  Pt concerned about whether she will need to keep her follow up appointment on May 06, 2012 with Dr. Stanford Breed because she stated she will probably be in inpatient rehab for about 4 weeks.  Instructed that she would receive an update about her upcoming appointment.  No other concerns or questions voiced.

## 2012-04-23 NOTE — Progress Notes (Signed)
Stroke Team Progress Note  HISTORY [name] is an 78 y.o. female who has been under treatment for recurrent vulvar cancer. Due to this she has been spending a lot of time in bed. Went out to eat with her husband on 4/27. About 6 hours after dinner had acute onset of nausea and vomiting. She had to go to bed due to weakness and eventually rolled off of the bed. She was able to be helped back to bed after a time. Was no better in the morning and presented for evaluation. Work up included a MRI of the brain that shows an acute inferior left cerebellar and posterior lateral left medullary infarcts. Patient was also found to be in atrial fibrillation. Patient was not a TPA candidate secondary to delay in arrival. She was admitted for further evaluation and treatment.  SUBJECTIVE Her husband is at the bedside.  Overall she feels her condition is stable. She is concerned with her inability to swallow. No changes overnight. OBJECTIVE Most recent Vital Signs: Filed Vitals:   04/22/12 1600 04/22/12 2000 04/22/12 2330 04/23/12 0400  BP:  121/76 136/48 128/65  Pulse:  66 69 62  Temp: 97.4 F (36.3 C) 98.5 F (36.9 C) 98.4 F (36.9 C) 97.3 F (36.3 C)  TempSrc: Oral  Oral Oral  Resp:  15 15 14   Height:      Weight:      SpO2: 98% 100% 98% 100%   CBG (last 3)  Basename 04/23/12 0357 04/22/12 2356 04/22/12 2012  GLUCAP 116* 81 108*   Intake/Output from previous day: 04/29 0701 - 04/30 0700 In: -  Out: 375 [Urine:375]  IV Fluid Intake:     . 0.9 % NaCl with KCl 20 mEq / L 75 mL/hr at 04/22/12 0701  . DISCONTD: diltiazem (CARDIZEM) infusion 5 mg/hr (04/22/12 1600)   MEDICATIONS    . aspirin  300 mg Rectal Daily  . enoxaparin  40 mg Subcutaneous Q24H  . insulin aspart  0-20 Units Subcutaneous Q4H  . levothyroxine  26 mcg Intravenous Q0600  . metoprolol  5 mg Intravenous Q6H  . DISCONTD: aspirin  324 mg Oral Once  . DISCONTD: aspirin EC  325 mg Oral Daily  . DISCONTD: atorvastatin  10  mg Oral q1800  . DISCONTD: diltiazem  90 mg Oral Q6H  . DISCONTD: doxazosin  0.5 mg Oral Daily  . DISCONTD: indapamide  1.25 mg Oral Daily  . DISCONTD: insulin aspart  0-15 Units Subcutaneous TID WC  . DISCONTD: insulin aspart  0-5 Units Subcutaneous QHS  . DISCONTD: insulin aspart  14 Units Subcutaneous TID AC  . DISCONTD: insulin detemir  20 Units Subcutaneous BID  . DISCONTD: levothyroxine  50 mcg Intravenous Q0600  . DISCONTD: olmesartan  20 mg Oral Daily  . DISCONTD: simvastatin  20 mg Oral q1800  . DISCONTD: sodium chloride  3 mL Intravenous Q12H   PRN:  acetaminophen, acetaminophen, alum & mag hydroxide-simeth, HYDROcodone-acetaminophen, ondansetron (ZOFRAN) IV, ondansetron  Diet:  NPO  Activity:   Bathroom privileges with assistance DVT Prophylaxis:  Lovenox 40 mg sq daily   CLINICALLY SIGNIFICANT STUDIES CBC    Component Value Date/Time   WBC 10.3 04/23/2012 0404   RBC 3.54* 04/23/2012 0404   HGB 11.2* 04/23/2012 0404   HCT 33.5* 04/23/2012 0404   PLT 134* 04/23/2012 0404   MCV 94.6 04/23/2012 0404   MCH 31.6 04/23/2012 0404   MCHC 33.4 04/23/2012 0404   RDW 14.5 04/23/2012 0404  LYMPHSABS 1.2 02/23/2012 1145   MONOABS 0.5 02/23/2012 1145   EOSABS 0.2 02/23/2012 1145   BASOSABS 0.0 02/23/2012 1145   CMP    Component Value Date/Time   NA 140 04/23/2012 0404   K 3.6 04/23/2012 0404   CL 102 04/23/2012 0404   CO2 28 04/23/2012 0404   GLUCOSE 104* 04/23/2012 0404   BUN 24* 04/23/2012 0404   CREATININE 1.55* 04/23/2012 0404   CALCIUM 9.2 04/23/2012 0404   PROT 7.1 04/21/2012 1734   ALBUMIN 3.7 04/21/2012 1734   AST 20 04/21/2012 1734   ALT 18 04/21/2012 1734   ALKPHOS 76 04/21/2012 1734   BILITOT 0.8 04/21/2012 1734   GFRNONAA 30* 04/23/2012 0404   GFRAA 35* 04/23/2012 0404   COAGS Lab Results  Component Value Date   INR 1.02 04/21/2012   Lipid Panel    Component Value Date/Time   CHOL 145 04/22/2012 0405   TRIG 129 04/22/2012 0405   HDL 54 04/22/2012 0405   CHOLHDL 2.7 04/22/2012  0405   VLDL 26 04/22/2012 0405   LDLCALC 65 04/22/2012 0405   HgbA1C  Lab Results  Component Value Date   HGBA1C 7.5* 04/22/2012   Cardiac Panel (last 3 results) No results found for this basename: CKTOTAL:3,CKMB:3,TROPONINI:3,RELINDX:3 in the last 72 hours Urinalysis    Component Value Date/Time   COLORURINE YELLOW 03/28/2012 2039   APPEARANCEUR CLEAR 03/28/2012 2039   LABSPEC 1.010 03/28/2012 2039   PHURINE 6.5 03/28/2012 2039   GLUCOSEU NEGATIVE 03/28/2012 2039   HGBUR SMALL* 03/28/2012 2039   BILIRUBINUR NEGATIVE 03/28/2012 2039   KETONESUR NEGATIVE 03/28/2012 2039   PROTEINUR NEGATIVE 03/28/2012 2039   UROBILINOGEN 0.2 03/28/2012 2039   NITRITE NEGATIVE 03/28/2012 2039   LEUKOCYTESUR MODERATE* 03/28/2012 2039   Urine Drug Screen  No results found for this basename: labopia, cocainscrnur, labbenz, amphetmu, thcu, labbarb    Alcohol Level No results found for this basename: eth   CT of the brain  04/21/2012  Volume loss and white matter changes.  No definite acute intracranial abnormality.  Hypoattenuation within the inferior left cerebellum, may reflect volume averaging.   MRI of the brain  04/22/2012  Moderate size acute non hemorrhagic infarct inferior left cerebellum and posterior lateral aspect of the left medulla.   MRA of the brain  04/22/2012   Nonvisualization left vertebral artery and both PICAs.    2D Echocardiogram  EF 55-60% with no source of embolus. Mild AS.  Carotid Doppler  Bilateral: No evidence of hemodynamically significant internal carotid artery stenosis. Vertebral artery flow is antegrade. Right CEA patent.  CXR  04/21/2012  :  1.  Interval cardiomegaly. 2.  Stable minimal chronic interstitial lung disease.     EKG  atrial fibrillation, rate 139.   Therapy Recommendations PT CIR, OT ---, ST NPO  Physical Exam    Pleasant elderly Caucasian lady not in distress.Awake alert. Afebrile. Head is nontraumatic. Neck is supple without bruit. Hearing is decreased. Cardiac exam no  murmur or gallop. Lungs are clear to auscultation. Distal pulses are well felt.   Neurological Exam ;Awake  Alert oriented x 3. Normal speech and language.eye movements full without nystagmus but saccadic dysmetria to left. Face symmetric. Tongue midline. Normal strength, tone, reflexes and coordination.Mild truncal ataxia.. Normal sensation. Gait deferred.   ASSESSMENT Ms. [name] is a 78 y.o. female with a inferior left cerebellum and posterior lateral aspect of the left medulla infarct secondary to left vertebral artery occlusion, likely a result of  newly diagnosed atrial fibrillation. On aspirin 81 mg orally every day prior to admission. Now on aspirin 300 mg rectally every day for secondary stroke prevention. Patient with resultant dysarthria, dysphagia, dizziness, vertigo, poor cough/gag. nausea/vomiting resolved  -hypotension on cardizem. -atrial fibrillation, followed by Dr. Anne Fu. On ASA alone PTA. -leukocytosis, improved 10.3 -low TSH -diabetes, poorly controlled, A1c 7.5 -hypertension -hyperlipidemia -vulvar carcinoma 10/2011, stable -stroke, right brain 05/2000 -thrombocytopenia, Plt 134  Hospital day # 2  TREATMENT/PLAN -Continue aspirin 300 mg rectally every day for secondary stroke prevention while NPO. Once able to swallow, agree with considering Pradaxa, or add Plavix to low dose aspirin if unable to tolerate anticoagulant -MBSS to assess swallow. May need panda. -OT eval -rehab consult  Joaquin Music, ANP-BC, GNP-BC [facility] Pager: [phone] 04/23/2012 8:25 AM  Dr. Delia Heady, Stroke Center Medical Director, has personally reviewed chart, pertinent data, examined the patient and developed the plan of care. Pager:  [phone]

## 2012-04-23 NOTE — Progress Notes (Signed)
[facility]  Interim history: 78 y/o female brought in for unsteady gait and found to have cerebellar and medullary infarcts in setting of a-fib.  She recently also had surgery for recurrent vulvar cancer.   Subjective: She has failed the swallow eval and a Karie Soda is being recommended by speech while they try to work with her on her swallowing over the next few days. Pt and husband is in agreement.   Objective: Blood pressure 142/57, pulse 59, temperature 97.7 F (36.5 C), temperature source Oral, resp. rate 16, height 5\' 4"  (1.626 m), weight 64.7 kg (142 lb 10.2 oz), SpO2 100.00%. Weight change:   Intake/Output Summary (Last 24 hours) at 04/23/12 1529 Last data filed at 04/23/12 0800  Gross per 24 hour  Intake      0 ml  Output    450 ml  Net   -450 ml    Physical Exam: General appearance: alert, cooperative and no distress Lungs: ronchi and wheezing Heart: RRR- bradycardic- HR 59 Abdomen: soft, non-tender; bowel sounds normal; no masses,  no organomegaly Extremities: extremities normal, atraumatic, no cyanosis or edema  Lab Results:  [facility] 04/23/12 0404 04/22/12 0405  NA 140 135  K 3.6 4.0  CL 102 94*  CO2 28 27  GLUCOSE 104* 234*  BUN 24* 18  CREATININE 1.55* 1.17*  CALCIUM 9.2 9.7  MG -- --  PHOS -- --    Basename 04/21/12 1734  AST 20  ALT 18  ALKPHOS 76  BILITOT 0.8  PROT 7.1  ALBUMIN 3.7   No results found for this basename: LIPASE:2,AMYLASE:2 in the last 72 hours  Basename 04/23/12 0404 04/22/12 0405  WBC 10.3 17.3*  NEUTROABS -- --  HGB 11.2* 13.2  HCT 33.5* 38.2  MCV 94.6 92.5  PLT 134* 149*   No results found for this basename: CKTOTAL:3,CKMB:3,CKMBINDEX:3,TROPONINI:3 in the last 72 hours No components found with this basename: POCBNP:3 No results found for this basename: DDIMER:2 in the last 72 hours  Basename 04/22/12 0405  HGBA1C 7.5*    Basename 04/22/12 0405  CHOL 145  HDL 54  LDLCALC 65  TRIG 129  CHOLHDL 2.7    LDLDIRECT --    Basename 04/22/12 0405  TSH 0.290*  T4TOTAL --  T3FREE --  THYROIDAB --   No results found for this basename: VITAMINB12:2,FOLATE:2,FERRITIN:2,TIBC:2,IRON:2,RETICCTPCT:2 in the last 72 hours  Micro Results: Recent Results (from the past 240 hour(s))  MRSA PCR SCREENING     Status: Normal   Collection Time   04/21/12 10:37 PM      Component Value Range Status Comment   MRSA by PCR NEGATIVE  NEGATIVE  Final     Studies/Results: Ct Head Wo Contrast  04/21/2012  *RADIOLOGY REPORT*  Clinical Data: Fall, weakness, trauma to the back of head.  CT HEAD WITHOUT CONTRAST  Technique:  Contiguous axial images were obtained from the base of the skull through the vertex without contrast.  Comparison: 03/25/2004  Findings: Prominence of the sulci, cisterns, and ventricles, in keeping with volume loss. There are subcortical and periventricular white matter hypodensities, a nonspecific finding most often seen with chronic microangiopathic changes.  There is no evidence for acute hemorrhage, overt hydrocephalus, mass lesion, or abnormal extra-axial fluid collection.  No definite CT evidence for acute cortical based (large artery) infarction. Remote lacunar infarction centered with the right basal ganglia. Hypoattenuation within the inferior left cerebellum may reflect volume averaging.  No associated mass effect.  Partially opacified left sphenoid chamber.  The  visualized paranasal sinuses and mastoid air cells are otherwise predominately clear.  No displaced calvarial fracture.  IMPRESSION: Volume loss and white matter changes.  No definite acute intracranial abnormality.  Hypoattenuation within the inferior left cerebellum, may reflect volume averaging. If there is clinical concern for acute ischemia, consider MRI follow-up.  Partially opacified left sphenoid chamber.  Correlate clinically if concern for sinusitis.  Original Report Authenticated By: Waneta Martins, M.D.   Mri Brain  Without Contrast  04/22/2012  *RADIOLOGY REPORT*  Clinical Data:  Fall.  Weakness.  Abnormal head CT.  MRI HEAD WITHOUT CONTRAST MRA HEAD WITHOUT CONTRAST  Technique: Multiplanar, multiecho pulse sequences of the brain and surrounding structures were obtained according to standard protocol without intravenous contrast.  Angiographic images of the head were obtained using MRA technique without contrast.  Comparison: 04/21/2012 head CT.  MRI HEAD  Findings:  Moderate sized acute non hemorrhagic infarct inferior left cerebellum and posterior lateral aspect of the left medulla.  Small focal area of blood breakdown products right anterior lower pons may be related to the prior hemorrhagic ischemia.  Small cavernoma not excluded.  Result of trauma felt to be less likely consideration otherwise no intracranial hemorrhage noted.  Remote tiny l right thalamic infarct and right lenticular nucleus infarct.  Mild to moderate small vessel disease type changes.  Global atrophy without hydrocephalus.  No intracranial mass lesion detected on this unenhanced exam.  Abnormal left vertebral artery.  Please see below.  Mild paranasal sinus mucosal thickening.  IMPRESSION: Moderate size acute non hemorrhagic infarct inferior left cerebellum and posterior lateral aspect of the left medulla.  MRA HEAD  Findings: Anterior circulation without medium or large size vessel significant stenosis or occlusion.  Mild branch vessel irregularity.  Nonvisualization of the left vertebral artery and left PICA. Dissection not excluded.  Mild irregularity without high-grade stenosis of the right vertebral artery or basilar artery.  Nonvisualization right PICA.  Small left AICA.  Bulge of the proximal left lateral basilar artery may represent the insertion site of the occluded left vertebral artery.  Posterior cerebral artery moderate tandem stenoses more notable on the right.  No aneurysm or vascular malformation noted.  IMPRESSION: Nonvisualization  left vertebral artery and both PICAs.  Please see above.  Original Report Authenticated By: Fuller Canada, M.D.   Dg Chest Port 1 View  04/23/2012  *RADIOLOGY REPORT*  Clinical Data: Infiltrates.  PORTABLE CHEST - 1 VIEW  Comparison: 04/21/2012  Findings: Cardiomegaly.  Mild vascular congestion.  Improving aeration in the left base.  Minimal residual atelectasis.  Right lung is clear.  No effusions.  IMPRESSION: Improving aeration in the left base.  Minimal residual left base atelectasis.  Cardiomegaly, vascular congestion.  Original Report Authenticated By: Cyndie Chime, M.D.   Dg Chest Portable 1 View  04/21/2012  *RADIOLOGY REPORT*  Clinical Data: Chest pain and shortness of breath.  Atrial fibrillation.  PORTABLE CHEST - 1 VIEW  Comparison: 02/23/2012.  Findings: Interval enlargement of the cardiac silhouette.  Clear lungs with normal vascularity.  Stable minimal prominence of the interstitial markings.  Diffuse osteopenia.  IMPRESSION:  1.  Interval cardiomegaly. 2.  Stable minimal chronic interstitial lung disease.  Original Report Authenticated By: Darrol Angel, M.D.   Dg Swallowing Func-no Report  04/22/2012  CLINICAL DATA: dysphagia   FLUOROSCOPY FOR SWALLOWING FUNCTION STUDY:  Fluoroscopy was provided for swallowing function study, which was  administered by a speech pathologist.  Final results and recommendations  from this  study are contained within the speech pathology report.     Mr Maxine Glenn Head/brain Wo Cm  04/22/2012  *RADIOLOGY REPORT*  Clinical Data:  Fall.  Weakness.  Abnormal head CT.  MRI HEAD WITHOUT CONTRAST MRA HEAD WITHOUT CONTRAST  Technique: Multiplanar, multiecho pulse sequences of the brain and surrounding structures were obtained according to standard protocol without intravenous contrast.  Angiographic images of the head were obtained using MRA technique without contrast.  Comparison: 04/21/2012 head CT.  MRI HEAD  Findings:  Moderate sized acute non hemorrhagic infarct  inferior left cerebellum and posterior lateral aspect of the left medulla.  Small focal area of blood breakdown products right anterior lower pons may be related to the prior hemorrhagic ischemia.  Small cavernoma not excluded.  Result of trauma felt to be less likely consideration otherwise no intracranial hemorrhage noted.  Remote tiny l right thalamic infarct and right lenticular nucleus infarct.  Mild to moderate small vessel disease type changes.  Global atrophy without hydrocephalus.  No intracranial mass lesion detected on this unenhanced exam.  Abnormal left vertebral artery.  Please see below.  Mild paranasal sinus mucosal thickening.  IMPRESSION: Moderate size acute non hemorrhagic infarct inferior left cerebellum and posterior lateral aspect of the left medulla.  MRA HEAD  Findings: Anterior circulation without medium or large size vessel significant stenosis or occlusion.  Mild branch vessel irregularity.  Nonvisualization of the left vertebral artery and left PICA. Dissection not excluded.  Mild irregularity without high-grade stenosis of the right vertebral artery or basilar artery.  Nonvisualization right PICA.  Small left AICA.  Bulge of the proximal left lateral basilar artery may represent the insertion site of the occluded left vertebral artery.  Posterior cerebral artery moderate tandem stenoses more notable on the right.  No aneurysm or vascular malformation noted.  IMPRESSION: Nonvisualization left vertebral artery and both PICAs.  Please see above.  Original Report Authenticated By: Fuller Canada, M.D.    Medications: Scheduled Meds:    . aspirin  300 mg Rectal Daily  . enoxaparin  40 mg Subcutaneous Q24H  . insulin aspart  0-20 Units Subcutaneous Q4H  . levothyroxine  26 mcg Intravenous Q0600  . metoprolol  5 mg Intravenous Q6H  . DISCONTD: aspirin  324 mg Oral Once  . DISCONTD: aspirin EC  325 mg Oral Daily  . DISCONTD: atorvastatin  10 mg Oral q1800  . DISCONTD: diltiazem   90 mg Oral Q6H  . DISCONTD: doxazosin  0.5 mg Oral Daily  . DISCONTD: indapamide  1.25 mg Oral Daily  . DISCONTD: insulin aspart  0-15 Units Subcutaneous TID WC  . DISCONTD: insulin aspart  0-5 Units Subcutaneous QHS  . DISCONTD: insulin aspart  14 Units Subcutaneous TID AC  . DISCONTD: insulin detemir  20 Units Subcutaneous BID  . DISCONTD: levothyroxine  50 mcg Intravenous Q0600  . DISCONTD: olmesartan  20 mg Oral Daily  . DISCONTD: sodium chloride  3 mL Intravenous Q12H   Continuous Infusions:    . DISCONTD: diltiazem (CARDIZEM) infusion 5 mg/hr (04/22/12 1600)   PRN Meds:.acetaminophen, acetaminophen, alum & mag hydroxide-simeth, HYDROcodone-acetaminophen, ondansetron (ZOFRAN) IV, ondansetron  Assessment/Plan: Active Problems:  CVA (cerebral infarction)- multifocal- secondary to a-fib Panda tube for dysphagia PT/OT  will need to ask for a cards consult to give advice on anticoagulation- however will need to do this after a PT eval as her presenting complaint is ataxia On ASA for now   Atrial fibrillation with RVR Now sinus brady, was only  on ASA - A-fib was noted during a cards pre-op eval last fall   Vulvar cancer, carcinoma S/p multiple surgeries. She will f/u with her oncologist as oupt   Diabetes mellitus Cont sliding scale- on insulin at home- sugars will likely increase when we start tube feeds   Hypertension On IV Metoprolol for now. Hold for bradycardia.   Hyperlipidemia Cont statin when Panda placed   LOS: 2 days   [facility] [phone] 04/23/2012, 3:29 PM

## 2012-04-23 NOTE — Progress Notes (Signed)
04/23/2012 6:32 PM [name]  [id number]  Report called to Kendra,RN on receiving unit, 3000. VSS. Transferred with belongings to new room, 3037. Unable to get panda placed, md notified and orders for panda placed under fluro.   [name] 4/30/20136:32 PM

## 2012-04-23 NOTE — Consult Note (Signed)
Physical Medicine and Rehabilitation Consult Reason for Consult: Stroke Referring Phsyician: Dr. Tawana Scale is an 78 y.o. female.   HPI: 78 year old right-handed female with history of hypertension diabetes mellitus as well as right brain stroke 2001. Patient with recent radical vulvectomy 8 weeks ago for recurrent vulvar cancer. She is currently not undergoing chemotherapy or radiation therapy. Admitted 04/21/2012 after episode of nausea vomiting as well as a fall when she attempted to get out of bed. Her husband called 911 she was brought to the emergency room. In the emergency room she was found to have a true fibrillation with RVR. MRI of the brain showed moderate-sized acute nonhemorrhagic infarction in inferior left cerebellum and posterior lateral aspect of the left medulla. Echocardiogram with ejection fraction 55-60% without embolus and mild aortic stenosis. Carotid Dopplers with no ICA stenosis. Neurology consulted presently on aspirin and subcutaneous Lovenox however they are considering pradaxa. Cardiology is to followup in regards to her atrial fibrillation and plan of care. Patient is presently n.p.o. with speech therapy to followup in regards to appropriate diet after failed modified barium swallow 04/22/2012 and considering planned for nasogastric tube feeds. Physical therapy evaluation completed 04/22/2012 with recommendations for physical medicine rehabilitation consult to consider inpatient rehabilitation services  Review of Systems  Constitutional: Positive for malaise/fatigue.  Cardiovascular: Positive for palpitations.  Genitourinary: Positive for urgency.  Musculoskeletal: Positive for falls.  All other systems reviewed and are negative.   Past Medical History  Diagnosis Date  . Diabetes mellitus   . Hypertension   . Hyperlipidemia   . Obesity   . Hypothyroidism   . Vulvar cancer, carcinoma 11/03/2011  . Stroke 05/2000    right brain CVA pre H&P 2001  . Atrial  fibrillation    Past Surgical History  Procedure Date  . Vulvar lesion removal 2012  . Breast surgery     Lumpectomy  . Dilation and curettage of uterus   . Cystourethroplasty / ureteroneocystostomy   . Radical vulvectomy   . Lymphadenectomy 2007    R inguinal femoral  . Radical vulvectomy 02/27/12  . Vulvar lesion removal 02/27/2012    Procedure: VULVAR LESION;  Surgeon: Jeannette Corpus, MD;  Location: [facility];  Service: Gynecology;  Laterality: N/A;   Family History  Problem Relation Age of Onset  . Diabetes type II     Social History:  reports that she quit smoking about 44 years ago. Her smoking use included Cigarettes. She has a 4.5 pack-year smoking history. She does not have any smokeless tobacco history on file. She reports that she does not drink alcohol or use illicit drugs. Allergies: No Known Allergies Medications Prior to Admission  Medication Sig Dispense Refill  . alendronate (FOSAMAX) 70 MG tablet Take 70 mg by mouth every 7 (seven) days. On Sunday. Take with a full glass of water on an empty stomach. Patient states she has not taken this medication in the last 2-3 weeks.      [name] aspirin EC 81 MG tablet Take 81 mg by mouth every morning.      [name] atorvastatin (LIPITOR) 20 MG tablet Take 20 mg by mouth at bedtime.       [name] diltiazem (CARDIZEM CD) 240 MG 24 hr capsule Take 240 mg by mouth daily.       [name] doxazosin (CARDURA) 2 MG tablet Take 0.5 mg by mouth daily.       . indapamide (LOZOL) 1.25 MG tablet Take 1.25 mg by mouth every morning.       [name]  insulin aspart (NOVOLOG) 100 UNIT/ML injection Inject 14-32 Units into the skin 3 (three) times daily before meals. 16 units in the am, 14 at lunch, and 32 at bedtime      . insulin detemir (LEVEMIR) 100 UNIT/ML injection Inject 16-32 Units into the skin 2 (two) times daily. 32 units at breakfast, 16 in the evening      . levothyroxine (SYNTHROID, LEVOTHROID) 100 MCG tablet Take 100 mcg by mouth every morning.       . valsartan  (DIOVAN) 320 MG tablet Take 320 mg by mouth every morning.       . Vitamin D, Ergocalciferol, (DRISDOL) 50000 UNITS CAPS Take 50,000 Units by mouth every 7 (seven) days. On Mondays.        Home: Home Living Lives With: Spouse Available Help at Discharge: Family;Available 24 hours/day (spouse is supervision only, he walks with a cane) Type of Home: House Home Access: Stairs to enter [employer] of Steps: 2 Entrance Stairs-Rails: None Home Layout: Multi-level (split level) Alternate Level Stairs-Number of Steps: 2 Alternate Level Stairs-Rails: Right;Left Bathroom Shower/Tub: Tub/shower unit;Curtain Bathroom Toilet: Standard Bathroom Accessibility: No (cannot get into bathroom with RW, has to use furniture ) Home Adaptive Equipment: Bedside commode/3-in-1;Tub transfer bench;Walker - rolling;Straight cane Additional Comments: prior to admission she would use the straight cane on occations when walking out of the house  Functional History: Prior Function Able to Take Stairs?: Yes Driving: No Functional Status:  Mobility: Bed Mobility Bed Mobility: Supine to Sit;Sitting - Scoot to Edge of Bed;Sit to Supine Supine to Sit: 5: Supervision;HOB flat;With rails Sitting - Scoot to Edge of Bed: 5: Supervision;With rail Transfers Transfers: Sit to Stand;Stand to Sit;Stand Pivot Transfers Sit to Stand: 3: Mod assist;With upper extremity assist;From bed Stand to Sit: 4: Min assist;With armrests;To chair/3-in-1;To bed;With upper extremity assist Stand Pivot Transfers: 4: Min assist      ADL:    Cognition: Cognition Arousal/Alertness: Awake/alert Orientation Level: Oriented X4 Cognition Overall Cognitive Status: Appears within functional limits for tasks assessed/performed Arousal/Alertness: Awake/alert Behavior During Session: [facility] for tasks performed Cognition - Other Comments: not specifically tested, but conversation normal history reported confirmed by family.    Blood  pressure 118/81, pulse 56, temperature 97.5 F (36.4 C), temperature source Oral, resp. rate 17, height 5\' 4"  (1.626 m), weight 64.7 kg (142 lb 10.2 oz), SpO2 99.00%. Physical Exam  Vitals reviewed. HENT:  Head: Normocephalic.  Neck: Neck supple. No thyromegaly present.  Cardiovascular:       Irregular irregular  Pulmonary/Chest:       Decreased breath sounds at the bases without rales  Abdominal: She exhibits no distension. There is no tenderness.  Musculoskeletal: She exhibits no edema.  Neurological: She is alert. No cranial nerve deficit or sensory deficit.       Speech is mildly dysarthric but fully intelligible. She is oriented x3 and follows commands well. Left limb ataxia upper greater than lower extremity. Cognitively she is alert and appropriate. Strength is grossly 3-4/5 proximal to 4+ out of 5 distally upper and lower extremities.  Skin: Skin is warm and dry.  Psychiatric: She has a normal mood and affect.    Results for orders placed during the hospital encounter of 04/21/12 (from the past 24 hour(s))  GLUCOSE, CAPILLARY     Status: Abnormal   Collection Time   04/22/12 12:24 PM      Component Value Range   Glucose-Capillary 184 (*) 70 - 99 (mg/dL)   Comment 1 Notify  RN    GLUCOSE, CAPILLARY     Status: Abnormal   Collection Time   04/22/12  5:40 PM      Component Value Range   Glucose-Capillary 162 (*) 70 - 99 (mg/dL)   Comment 1 Notify RN    GLUCOSE, CAPILLARY     Status: Abnormal   Collection Time   04/22/12  8:12 PM      Component Value Range   Glucose-Capillary 108 (*) 70 - 99 (mg/dL)   Comment 1 Notify RN    GLUCOSE, CAPILLARY     Status: Normal   Collection Time   04/22/12 11:56 PM      Component Value Range   Glucose-Capillary 81  70 - 99 (mg/dL)   Comment 1 Notify RN     Comment 2 Documented in Chart    GLUCOSE, CAPILLARY     Status: Abnormal   Collection Time   04/23/12  3:57 AM      Component Value Range   Glucose-Capillary 116 (*) 70 - 99 (mg/dL)     Comment 1 Notify RN     Comment 2 Documented in Chart    BASIC METABOLIC PANEL     Status: Abnormal   Collection Time   04/23/12  4:04 AM      Component Value Range   Sodium 140  135 - 145 (mEq/L)   Potassium 3.6  3.5 - 5.1 (mEq/L)   Chloride 102  96 - 112 (mEq/L)   CO2 28  19 - 32 (mEq/L)   Glucose, Bld 104 (*) 70 - 99 (mg/dL)   BUN 24 (*) 6 - 23 (mg/dL)   Creatinine, Ser 4.09 (*) 0.50 - 1.10 (mg/dL)   Calcium 9.2  8.4 - 81.1 (mg/dL)   GFR calc non Af Amer 30 (*) >90 (mL/min)   GFR calc Af Amer 35 (*) >90 (mL/min)  CBC     Status: Abnormal   Collection Time   04/23/12  4:04 AM      Component Value Range   WBC 10.3  4.0 - 10.5 (K/uL)   RBC 3.54 (*) 3.87 - 5.11 (MIL/uL)   Hemoglobin 11.2 (*) 12.0 - 15.0 (g/dL)   HCT 91.4 (*) 78.2 - 46.0 (%)   MCV 94.6  78.0 - 100.0 (fL)   MCH 31.6  26.0 - 34.0 (pg)   MCHC 33.4  30.0 - 36.0 (g/dL)   RDW 95.6  21.3 - 08.6 (%)   Platelets 134 (*) 150 - 400 (K/uL)  GLUCOSE, CAPILLARY     Status: Abnormal   Collection Time   04/23/12  7:41 AM      Component Value Range   Glucose-Capillary 116 (*) 70 - 99 (mg/dL)   Ct Head Wo Contrast  04/21/2012  *RADIOLOGY REPORT*  Clinical Data: Fall, weakness, trauma to the back of head.  CT HEAD WITHOUT CONTRAST  Technique:  Contiguous axial images were obtained from the base of the skull through the vertex without contrast.  Comparison: 03/25/2004  Findings: Prominence of the sulci, cisterns, and ventricles, in keeping with volume loss. There are subcortical and periventricular white matter hypodensities, a nonspecific finding most often seen with chronic microangiopathic changes.  There is no evidence for acute hemorrhage, overt hydrocephalus, mass lesion, or abnormal extra-axial fluid collection.  No definite CT evidence for acute cortical based (large artery) infarction. Remote lacunar infarction centered with the right basal ganglia. Hypoattenuation within the inferior left cerebellum may reflect volume  averaging.  No associated mass effect.  Partially opacified left sphenoid chamber.  The visualized paranasal sinuses and mastoid air cells are otherwise predominately clear.  No displaced calvarial fracture.  IMPRESSION: Volume loss and white matter changes.  No definite acute intracranial abnormality.  Hypoattenuation within the inferior left cerebellum, may reflect volume averaging. If there is clinical concern for acute ischemia, consider MRI follow-up.  Partially opacified left sphenoid chamber.  Correlate clinically if concern for sinusitis.  Original Report Authenticated By: Waneta Martins, M.D.   Mri Brain Without Contrast  04/22/2012  *RADIOLOGY REPORT*  Clinical Data:  Fall.  Weakness.  Abnormal head CT.  MRI HEAD WITHOUT CONTRAST MRA HEAD WITHOUT CONTRAST  Technique: Multiplanar, multiecho pulse sequences of the brain and surrounding structures were obtained according to standard protocol without intravenous contrast.  Angiographic images of the head were obtained using MRA technique without contrast.  Comparison: 04/21/2012 head CT.  MRI HEAD  Findings:  Moderate sized acute non hemorrhagic infarct inferior left cerebellum and posterior lateral aspect of the left medulla.  Small focal area of blood breakdown products right anterior lower pons may be related to the prior hemorrhagic ischemia.  Small cavernoma not excluded.  Result of trauma felt to be less likely consideration otherwise no intracranial hemorrhage noted.  Remote tiny l right thalamic infarct and right lenticular nucleus infarct.  Mild to moderate small vessel disease type changes.  Global atrophy without hydrocephalus.  No intracranial mass lesion detected on this unenhanced exam.  Abnormal left vertebral artery.  Please see below.  Mild paranasal sinus mucosal thickening.  IMPRESSION: Moderate size acute non hemorrhagic infarct inferior left cerebellum and posterior lateral aspect of the left medulla.  MRA HEAD  Findings: Anterior  circulation without medium or large size vessel significant stenosis or occlusion.  Mild branch vessel irregularity.  Nonvisualization of the left vertebral artery and left PICA. Dissection not excluded.  Mild irregularity without high-grade stenosis of the right vertebral artery or basilar artery.  Nonvisualization right PICA.  Small left AICA.  Bulge of the proximal left lateral basilar artery may represent the insertion site of the occluded left vertebral artery.  Posterior cerebral artery moderate tandem stenoses more notable on the right.  No aneurysm or vascular malformation noted.  IMPRESSION: Nonvisualization left vertebral artery and both PICAs.  Please see above.  Original Report Authenticated By: Fuller Canada, M.D.   Dg Chest Port 1 View  04/23/2012  *RADIOLOGY REPORT*  Clinical Data: Infiltrates.  PORTABLE CHEST - 1 VIEW  Comparison: 04/21/2012  Findings: Cardiomegaly.  Mild vascular congestion.  Improving aeration in the left base.  Minimal residual atelectasis.  Right lung is clear.  No effusions.  IMPRESSION: Improving aeration in the left base.  Minimal residual left base atelectasis.  Cardiomegaly, vascular congestion.  Original Report Authenticated By: Cyndie Chime, M.D.   Dg Chest Portable 1 View  04/21/2012  *RADIOLOGY REPORT*  Clinical Data: Chest pain and shortness of breath.  Atrial fibrillation.  PORTABLE CHEST - 1 VIEW  Comparison: 02/23/2012.  Findings: Interval enlargement of the cardiac silhouette.  Clear lungs with normal vascularity.  Stable minimal prominence of the interstitial markings.  Diffuse osteopenia.  IMPRESSION:  1.  Interval cardiomegaly. 2.  Stable minimal chronic interstitial lung disease.  Original Report Authenticated By: Darrol Angel, M.D.   Dg Swallowing Func-no Report  04/22/2012  CLINICAL DATA: dysphagia   FLUOROSCOPY FOR SWALLOWING FUNCTION STUDY:  Fluoroscopy was provided for swallowing function study, which was  administered by a speech  pathologist.  Final results and recommendations  from this study are contained within the speech pathology report.     Mr Maxine Glenn Head/brain Wo Cm  04/22/2012  *RADIOLOGY REPORT*  Clinical Data:  Fall.  Weakness.  Abnormal head CT.  MRI HEAD WITHOUT CONTRAST MRA HEAD WITHOUT CONTRAST  Technique: Multiplanar, multiecho pulse sequences of the brain and surrounding structures were obtained according to standard protocol without intravenous contrast.  Angiographic images of the head were obtained using MRA technique without contrast.  Comparison: 04/21/2012 head CT.  MRI HEAD  Findings:  Moderate sized acute non hemorrhagic infarct inferior left cerebellum and posterior lateral aspect of the left medulla.  Small focal area of blood breakdown products right anterior lower pons may be related to the prior hemorrhagic ischemia.  Small cavernoma not excluded.  Result of trauma felt to be less likely consideration otherwise no intracranial hemorrhage noted.  Remote tiny l right thalamic infarct and right lenticular nucleus infarct.  Mild to moderate small vessel disease type changes.  Global atrophy without hydrocephalus.  No intracranial mass lesion detected on this unenhanced exam.  Abnormal left vertebral artery.  Please see below.  Mild paranasal sinus mucosal thickening.  IMPRESSION: Moderate size acute non hemorrhagic infarct inferior left cerebellum and posterior lateral aspect of the left medulla.  MRA HEAD  Findings: Anterior circulation without medium or large size vessel significant stenosis or occlusion.  Mild branch vessel irregularity.  Nonvisualization of the left vertebral artery and left PICA. Dissection not excluded.  Mild irregularity without high-grade stenosis of the right vertebral artery or basilar artery.  Nonvisualization right PICA.  Small left AICA.  Bulge of the proximal left lateral basilar artery may represent the insertion site of the occluded left vertebral artery.  Posterior cerebral artery  moderate tandem stenoses more notable on the right.  No aneurysm or vascular malformation noted.  IMPRESSION: Nonvisualization left vertebral artery and both PICAs.  Please see above.  Original Report Authenticated By: Fuller Canada, M.D.    Assessment/Plan: Diagnosis: Left posterior circulation infarcts with left hemiataxia 1. Does the need for close, 24 hr/day medical supervision in concert with the patient's rehab needs make it unreasonable for this patient to be served in a less intensive setting? Yes 2. Co-Morbidities requiring supervision/potential complications: Diabetes, atrial fibrillation, vulvar cancer 3. Due to bladder management, bowel management, safety, skin/wound care, disease management, medication administration, pain management and patient education, does the patient require 24 hr/day rehab nursing? Yes 4. Does the patient require coordinated care of a physician, rehab nurse, PT (1-2 hrs/day, 5 days/week), OT (1-2 hrs/day, 5 days/week) and SLP (1-2 hrs/day, 5 days/week) to address physical and functional deficits in the context of the above medical diagnosis(es)? Yes Addressing deficits in the following areas: balance, endurance, locomotion, strength, transferring, bowel/bladder control, bathing, dressing, feeding, grooming, toileting, speech and psychosocial support 5. Can the patient actively participate in an intensive therapy program of at least 3 hrs of therapy per day at least 5 days per week? Yes 6. The potential for patient to make measurable gains while on inpatient rehab is excellent 7. Anticipated functional outcomes upon discharge from inpatient rehab are supervision with PT, supervision to minimal assistance with OT, revision to modified independent with SLP. 8. Estimated rehab length of stay to reach the above functional goals is: 2 weeks 9. Does the patient have adequate social supports to accommodate these discharge functional goals? Yes 10. Anticipated D/C  setting: Home 11. Anticipated post D/C treatments: HH therapy 12. Overall Rehab/Functional  Prognosis: excellent  RECOMMENDATIONS: This patient's condition is appropriate for continued rehabilitative care in the following setting: CIR Patient has agreed to participate in recommended program. Yes Note that insurance prior authorization may be required for reimbursement for recommended care.  Comment: Rehabilitation nurse to followup.   Ranelle Oyster M.D. 04/23/2012

## 2012-04-24 ENCOUNTER — Inpatient Hospital Stay (HOSPITAL_COMMUNITY): Payer: Medicare Other

## 2012-04-24 DIAGNOSIS — E1165 Type 2 diabetes mellitus with hyperglycemia: Secondary | ICD-10-CM

## 2012-04-24 DIAGNOSIS — I10 Essential (primary) hypertension: Secondary | ICD-10-CM

## 2012-04-24 DIAGNOSIS — I4891 Unspecified atrial fibrillation: Secondary | ICD-10-CM

## 2012-04-24 DIAGNOSIS — D649 Anemia, unspecified: Secondary | ICD-10-CM

## 2012-04-24 DIAGNOSIS — I6340 Cerebral infarction due to embolism of unspecified cerebral artery: Secondary | ICD-10-CM

## 2012-04-24 LAB — GLUCOSE, CAPILLARY
Glucose-Capillary: 103 mg/dL — ABNORMAL HIGH (ref 70–99)
Glucose-Capillary: 106 mg/dL — ABNORMAL HIGH (ref 70–99)
Glucose-Capillary: 131 mg/dL — ABNORMAL HIGH (ref 70–99)
Glucose-Capillary: 172 mg/dL — ABNORMAL HIGH (ref 70–99)
Glucose-Capillary: 95 mg/dL (ref 70–99)
Glucose-Capillary: 98 mg/dL (ref 70–99)

## 2012-04-24 LAB — URINALYSIS, ROUTINE W REFLEX MICROSCOPIC
Bilirubin Urine: NEGATIVE
Specific Gravity, Urine: 1.017 (ref 1.005–1.030)
Urobilinogen, UA: 1 mg/dL (ref 0.0–1.0)
pH: 6 (ref 5.0–8.0)

## 2012-04-24 MED ORDER — DILTIAZEM HCL 30 MG PO TABS
30.0000 mg | ORAL_TABLET | Freq: Three times a day (TID) | ORAL | Status: DC
  Administered 2012-04-24: 30 mg via ORAL
  Filled 2012-04-24 (×3): qty 1

## 2012-04-24 MED ORDER — ENOXAPARIN SODIUM 30 MG/0.3ML ~~LOC~~ SOLN
30.0000 mg | SUBCUTANEOUS | Status: DC
  Administered 2012-04-25: 30 mg via SUBCUTANEOUS
  Filled 2012-04-24: qty 0.3

## 2012-04-24 MED ORDER — JEVITY 1.2 CAL PO LIQD
1000.0000 mL | ORAL | Status: DC
  Administered 2012-04-24: 1000 mL
  Filled 2012-04-24 (×3): qty 1000

## 2012-04-24 MED ORDER — ATORVASTATIN CALCIUM 10 MG PO TABS
10.0000 mg | ORAL_TABLET | Freq: Every day | ORAL | Status: DC
  Administered 2012-04-24: 10 mg
  Filled 2012-04-24 (×2): qty 1

## 2012-04-24 MED ORDER — PRO-STAT SUGAR FREE PO LIQD
30.0000 mL | Freq: Every day | ORAL | Status: DC
  Administered 2012-04-25: 30 mL
  Filled 2012-04-24 (×2): qty 30

## 2012-04-24 MED ORDER — DILTIAZEM HCL 60 MG PO TABS
60.0000 mg | ORAL_TABLET | Freq: Four times a day (QID) | ORAL | Status: DC
  Filled 2012-04-24 (×3): qty 1

## 2012-04-24 MED ORDER — IRBESARTAN 150 MG PO TABS
150.0000 mg | ORAL_TABLET | Freq: Every day | ORAL | Status: DC
  Administered 2012-04-24 – 2012-04-25 (×2): 150 mg via ORAL
  Filled 2012-04-24 (×2): qty 1

## 2012-04-24 MED ORDER — IOHEXOL 300 MG/ML  SOLN
10.0000 mL | Freq: Once | INTRAMUSCULAR | Status: AC | PRN
  Administered 2012-04-24: 10 mL

## 2012-04-24 MED ORDER — DILTIAZEM HCL 60 MG PO TABS
60.0000 mg | ORAL_TABLET | Freq: Four times a day (QID) | ORAL | Status: DC
  Administered 2012-04-24 – 2012-04-25 (×4): 60 mg via ORAL
  Filled 2012-04-24 (×7): qty 1

## 2012-04-24 NOTE — Progress Notes (Signed)
INITIAL ADULT NUTRITION ASSESSMENT Date: 04/24/2012   Time: 8:22 AM Reason for Assessment: TF management  ASSESSMENT: Female 78 y.o.  Dx: Weakness, Fall  Hx:  Past Medical History  Diagnosis Date  . Diabetes mellitus   . Hypertension   . Hyperlipidemia   . Obesity   . Hypothyroidism   . Vulvar cancer, carcinoma 11/03/2011  . Stroke 05/2000    right brain CVA pre H&P 2001  . Atrial fibrillation    Related Meds:     . aspirin  81 mg Per Tube Daily  . enoxaparin  40 mg Subcutaneous Q24H  . insulin aspart  0-20 Units Subcutaneous Q4H  . levothyroxine  100 mcg Per Tube Q0600  . metoprolol  5 mg Intravenous Q6H  . simvastatin  20 mg Per Tube q1800  . DISCONTD: aspirin  300 mg Rectal Daily  . DISCONTD: levothyroxine  26 mcg Intravenous Q0600    Ht: 5\' 3"  (160 cm)  Wt: 151 lbs/68.6 kg  Ideal Wt: 52.2 kg % Ideal Wt: 131%%  Usual Wt: 161 lbs 8 weeks ago per pt Wt Readings from Last 10 Encounters:  04/23/12 116 lb 2.9 oz (52.7 kg)  03/29/12 150 lb (68.04 kg)  03/15/12 150 lb 8 oz (68.266 kg)  02/27/12 154 lb (69.854 kg)  02/27/12 154 lb (69.854 kg)  02/23/12 154 lb (69.854 kg)  02/05/12 155 lb 4.8 oz (70.444 kg)  11/03/11 156 lb 11.2 oz (71.079 kg)    % Usual Wt: 94% 6% wt loss in 2 months after recent surgery for cancer  BMI: 26.8 overweight  Food/Nutrition Related Hx:  approximately 8 weeks ago underwent radical vulvectomy for recurrent vulvar cancer. Currently she's not undergoing chemotherapy or radiation therapy. Has been mostly bedbound last 8 weeks. Lives with husband. Pt admitted with cerebellar and medullary infarcts in setting of a-fib. She has failed the swallow eval and a Karie Soda is being recommended by speech while they try to work with her on her swallowing over the next few days. Pt and husband is in agreement. Panda unable to be placed yesterday. Per pt she has lost 10 lbs or 6% of her body wt in 2 months due to a decreased appetite after recent surgery.    Per 24 hr recall pt was only eating some grits or oatmeal for breakfast, ensure for lunch, and for supper she was eating foods brought over by her church family.    Labs:  CMP     Component Value Date/Time   NA 140 04/23/2012 0404   K 3.6 04/23/2012 0404   CL 102 04/23/2012 0404   CO2 28 04/23/2012 0404   GLUCOSE 104* 04/23/2012 0404   BUN 24* 04/23/2012 0404   CREATININE 1.55* 04/23/2012 0404   CALCIUM 9.2 04/23/2012 0404   PROT 7.1 04/21/2012 1734   ALBUMIN 3.7 04/21/2012 1734   AST 20 04/21/2012 1734   ALT 18 04/21/2012 1734   ALKPHOS 76 04/21/2012 1734   BILITOT 0.8 04/21/2012 1734   GFRNONAA 30* 04/23/2012 0404   GFRAA 35* 04/23/2012 0404   CBG (last 3)   Basename 04/24/12 0422 04/24/12 0016 04/23/12 2042  GLUCAP 106* 95 122*   Lab Results  Component Value Date   HGBA1C 7.5* 04/22/2012    Intake/Output Summary (Last 24 hours) at 04/24/12 0825 Last data filed at 04/23/12 1800  Gross per 24 hour  Intake      0 ml  Output    200 ml  Net   -200 ml  Diet Order: NPO  Supplements/Tube Feeding: none  IVF: NA  Estimated Nutritional Needs:   Kcal: 1375-1600 Protein: 65-75 Fluid: >1.3 L/day  NUTRITION DIAGNOSIS: -Swallowing Difficutly.  Status: Ongoing  RELATED TO: stroke  AS EVIDENCE BY: NPO status  MONITORING/EVALUATION(Goals): Goal: Provide >/= 90% of pt's estimated needs. Monitor: TF tolerance, weight, labs, swallow function  EDUCATION NEEDS: -No education needs identified at this time  INTERVENTION:  Initiate Jevity 1.2 @ 15 ml/hr and increase by 10 ml every 4 hours to goal rate of 45 ml/hr  30 ml Prostat daily  160 ml H2O every 6 hours  At goal, TF regimen will provide 1396 kcals, 75 grams protein, 1515 ml total free H2O, and 183 grams CHO.  Dietitian #:[phone]  DOCUMENTATION CODES Per approved criteria  -Not Applicable    Kendell Bane Cornelison 04/24/2012, 8:22 AM

## 2012-04-24 NOTE — Progress Notes (Signed)
Patient had increased BP at 184/98 and HR of 142 that was sustaining after working with PT. Strip received from tele stated A.fib. Dr. Lavera Guise paged and he ordered 60mg  Cardizem PO. Given at 1451. Also, 1200 dose of metroprolol given (previously held because HR 55). Patient's HR returned to 80-90s. MD aware. Will continue to monitor.

## 2012-04-24 NOTE — Progress Notes (Signed)
04/24/2012 [facility] Elizabeth PTA [phone] pager [phone] office    

## 2012-04-24 NOTE — Progress Notes (Signed)
Stroke Team Progress Note  HISTORY [name] is an 78 y.o. female who has been under treatment for recurrent vulvar cancer. Due to this she has been spending a lot of time in bed. Went out to eat with her husband on 4/27. About 6 hours after dinner had acute onset of nausea and vomiting. She had to go to bed due to weakness and eventually rolled off of the bed. She was able to be helped back to bed after a time. Was no better in the morning and presented for evaluation. Work up included a MRI of the brain that shows an acute inferior left cerebellar and posterior lateral left medullary infarcts. Patient was also found to be in atrial fibrillation. Patient was not a TPA candidate secondary to delay in arrival. She was admitted for further evaluation and treatment.  SUBJECTIVE No complaints. Has panda tube placed. Awaiting a rehabilitation recommendations  OBJECTIVE Most recent Vital Signs: Filed Vitals:   04/24/12 0225 04/24/12 0520 04/24/12 0925 04/24/12 1224  BP: 156/74 169/82 185/89 173/81  Pulse: 59 63 60   Temp: 97.5 F (36.4 C) 98.1 F (36.7 C) 98.1 F (36.7 C)   TempSrc: Oral Oral Oral   Resp: 20 20 20    Height:      Weight:      SpO2: 97% 99% 99%    CBG (last 3)  Basename 04/24/12 1203 04/24/12 0923 04/24/12 0422  GLUCAP 103* 98 106*   Intake/Output from previous day: 04/30 0701 - 05/01 0700 In: -  Out: 400 [Urine:400]  IV Fluid Intake:     . feeding supplement (JEVITY 1.2 CAL) 1,000 mL (04/24/12 1208)   MEDICATIONS    . aspirin  81 mg Per Tube Daily  . atorvastatin  10 mg Per Tube q1800  . diltiazem  60 mg Oral Q6H  . enoxaparin  30 mg Subcutaneous Q24H  . feeding supplement  30 mL Per Tube Daily  . insulin aspart  0-20 Units Subcutaneous Q4H  . irbesartan  150 mg Oral Daily  . levothyroxine  100 mcg Per Tube Q0600  . metoprolol  5 mg Intravenous Q6H  . DISCONTD: aspirin  300 mg Rectal Daily  . DISCONTD: diltiazem  30 mg Oral Q8H  . DISCONTD: enoxaparin  40  mg Subcutaneous Q24H  . DISCONTD: levothyroxine  26 mcg Intravenous Q0600  . DISCONTD: simvastatin  20 mg Per Tube q1800   PRN:  acetaminophen, acetaminophen, alum & mag hydroxide-simeth, HYDROcodone-acetaminophen, iohexol, ondansetron (ZOFRAN) IV, ondansetron  Diet:  NPO  Activity:   Bathroom privileges with assistance DVT Prophylaxis:  Lovenox 40 mg sq daily   CLINICALLY SIGNIFICANT STUDIES CBC    Component Value Date/Time   WBC 10.3 04/23/2012 0404   RBC 3.54* 04/23/2012 0404   HGB 11.2* 04/23/2012 0404   HCT 33.5* 04/23/2012 0404   PLT 134* 04/23/2012 0404   MCV 94.6 04/23/2012 0404   MCH 31.6 04/23/2012 0404   MCHC 33.4 04/23/2012 0404   RDW 14.5 04/23/2012 0404   LYMPHSABS 1.2 02/23/2012 1145   MONOABS 0.5 02/23/2012 1145   EOSABS 0.2 02/23/2012 1145   BASOSABS 0.0 02/23/2012 1145   CMP    Component Value Date/Time   NA 140 04/23/2012 0404   K 3.6 04/23/2012 0404   CL 102 04/23/2012 0404   CO2 28 04/23/2012 0404   GLUCOSE 104* 04/23/2012 0404   BUN 24* 04/23/2012 0404   CREATININE 1.55* 04/23/2012 0404   CALCIUM 9.2 04/23/2012 0404   PROT  7.1 04/21/2012 1734   ALBUMIN 3.7 04/21/2012 1734   AST 20 04/21/2012 1734   ALT 18 04/21/2012 1734   ALKPHOS 76 04/21/2012 1734   BILITOT 0.8 04/21/2012 1734   GFRNONAA 30* 04/23/2012 0404   GFRAA 35* 04/23/2012 0404   COAGS Lab Results  Component Value Date   INR 1.02 04/21/2012   Lipid Panel    Component Value Date/Time   CHOL 145 04/22/2012 0405   TRIG 129 04/22/2012 0405   HDL 54 04/22/2012 0405   CHOLHDL 2.7 04/22/2012 0405   VLDL 26 04/22/2012 0405   LDLCALC 65 04/22/2012 0405   HgbA1C  Lab Results  Component Value Date   HGBA1C 7.5* 04/22/2012   Cardiac Panel (last 3 results) No results found for this basename: CKTOTAL:3,CKMB:3,TROPONINI:3,RELINDX:3 in the last 72 hours Urinalysis    Component Value Date/Time   COLORURINE YELLOW 04/24/2012 0835   APPEARANCEUR CLOUDY* 04/24/2012 0835   LABSPEC 1.017 04/24/2012 0835   PHURINE 6.0 04/24/2012 0835    GLUCOSEU NEGATIVE 04/24/2012 0835   HGBUR TRACE* 04/24/2012 0835   BILIRUBINUR NEGATIVE 04/24/2012 0835   KETONESUR 40* 04/24/2012 0835   PROTEINUR 30* 04/24/2012 0835   UROBILINOGEN 1.0 04/24/2012 0835   NITRITE POSITIVE* 04/24/2012 0835   LEUKOCYTESUR MODERATE* 04/24/2012 0835   Urine Drug Screen  No results found for this basename: labopia,  cocainscrnur,  labbenz,  amphetmu,  thcu,  labbarb    Alcohol Level No results found for this basename: eth   CT of the brain  04/21/2012  Volume loss and white matter changes.  No definite acute intracranial abnormality.  Hypoattenuation within the inferior left cerebellum, may reflect volume averaging.   MRI of the brain  04/22/2012  Moderate size acute non hemorrhagic infarct inferior left cerebellum and posterior lateral aspect of the left medulla.   MRA of the brain  04/22/2012   Nonvisualization left vertebral artery and both PICAs.    2D Echocardiogram  EF 55-60% with no source of embolus. Mild AS.  Carotid Doppler  Bilateral: No evidence of hemodynamically significant internal carotid artery stenosis. Vertebral artery flow is antegrade. Right CEA patent.  CXR  04/21/2012  :  1.  Interval cardiomegaly. 2.  Stable minimal chronic interstitial lung disease.     EKG  atrial fibrillation, rate 139.   Therapy Recommendations PT CIR, OT ---, ST NPO  Physical Exam    Pleasant elderly Caucasian lady not in distress.Awake alert. Afebrile. Head is nontraumatic. Neck is supple without bruit. Hearing is decreased. Cardiac exam no murmur or gallop. Lungs are clear to auscultation. Distal pulses are well felt.   Neurological Exam ;Awake  Alert oriented x 3. Normal speech and language.eye movements full without nystagmus but saccadic dysmetria to left.Mild left Face asymmetry.. Tongue midline. Normal strength, tone, reflexes and coordination.Mild truncal ataxia.. Normal sensation. Gait deferred.   ASSESSMENT [name] is a 78 y.o. female with a inferior  left cerebellum and posterior lateral aspect of the left medulla infarct secondary to left vertebral artery occlusion, likely a result of atrial fibrillation (previous dx by card last fall). On aspirin 81 mg orally every day prior to admission; unsure why not on anticoagulant. Now on aspirin 300 mg rectally every day for secondary stroke prevention. Patient with resultant dysarthria, dysphagia, dizziness, vertigo, poor cough/gag. nausea/vomiting resolved  -hypotension on cardizem. -atrial fibrillation, followed by Dr. Anne Fu. On ASA alone PTA. Neuro recommends anticoagulant once she can swallow or has permanent feeding tube. -leukocytosis, improved 10.3 -low  TSH -diabetes, poorly controlled, A1c 7.5 -hypertension -hyperlipidemia -vulvar carcinoma 10/2011, stable -stroke, right brain 05/2000 -thrombocytopenia, Plt 134  Hospital day # 3  TREATMENT/PLAN -Continue aspirin 300 mg rectally every day for secondary stroke prevention while NPO. Once able to swallow or has permanent feeding tube, recommend Pradaxa or other anticoagulant vs adding Plavix to low dose aspirin if unable to tolerate anticoagulant. -rehab when bed available  [name], AVNP, ANP-BC, GNP-BC [facility] Pager: [phone] 04/24/2012 2:38 PM  Dr. Delia Heady, Stroke Center Medical Director, has personally reviewed chart, pertinent data, examined the patient and developed the plan of care. Pager:  [phone]

## 2012-04-24 NOTE — Progress Notes (Signed)
[facility]  narrative 78 y/o female brought in for unsteady gait and found to have cerebellar and medullary infarcts in setting of a-fib.  She recently also had surgery for recurrent vulvar cancer.   Subjective: She has failed the swallow eval and a Karie Soda is being recommended by speech while they try to work with her on her swallowing over the next few days.  Had afib with rvr  Objective: Blood pressure 115/76, pulse 97, temperature 98.1 F (36.7 C), temperature source Oral, resp. rate 88, height 5\' 3"  (1.6 m), weight 52.7 kg (116 lb 2.9 oz), SpO2 95.00%. Weight change:   Intake/Output Summary (Last 24 hours) at 04/24/12 1739 Last data filed at 04/24/12 1600  Gross per 24 hour  Intake    160 ml  Output    200 ml  Net    -40 ml    Physical Exam: General appearance: alert, cooperative and no distress Lungs:CTAB Heart: RRR-   Abdomen: soft, non-tender; bowel sounds normal; no masses,  no organomegaly Extremities: extremities normal, atraumatic, no cyanosis or edema  Lab Results:  Basename 04/23/12 0404 04/22/12 0405  NA 140 135  K 3.6 4.0  CL 102 94*  CO2 28 27  GLUCOSE 104* 234*  BUN 24* 18  CREATININE 1.55* 1.17*  CALCIUM 9.2 9.7  MG -- --  PHOS -- --   No results found for this basename: AST:2,ALT:2,ALKPHOS:2,BILITOT:2,PROT:2,ALBUMIN:2 in the last 72 hours No results found for this basename: LIPASE:2,AMYLASE:2 in the last 72 hours  Basename 04/23/12 0404 04/22/12 0405  WBC 10.3 17.3*  NEUTROABS -- --  HGB 11.2* 13.2  HCT 33.5* 38.2  MCV 94.6 92.5  PLT 134* 149*   No results found for this basename: CKTOTAL:3,CKMB:3,CKMBINDEX:3,TROPONINI:3 in the last 72 hours No components found with this basename: POCBNP:3 No results found for this basename: DDIMER:2 in the last 72 hours  Basename 04/22/12 0405  HGBA1C 7.5*    Basename 04/22/12 0405  CHOL 145  HDL 54  LDLCALC 65  TRIG 129  CHOLHDL 2.7  LDLDIRECT --    Basename 04/22/12 0405  TSH 0.290*   T4TOTAL --  T3FREE --  THYROIDAB --   No results found for this basename: VITAMINB12:2,FOLATE:2,FERRITIN:2,TIBC:2,IRON:2,RETICCTPCT:2 in the last 72 hours  Micro Results: Recent Results (from the past 240 hour(s))  MRSA PCR SCREENING     Status: Normal   Collection Time   04/21/12 10:37 PM      Component Value Range Status Comment   MRSA by PCR NEGATIVE  NEGATIVE  Final     Studies/Results: Ct Head Wo Contrast  04/21/2012  *RADIOLOGY REPORT*  Clinical Data: Fall, weakness, trauma to the back of head.  CT HEAD WITHOUT CONTRAST  Technique:  Contiguous axial images were obtained from the base of the skull through the vertex without contrast.  Comparison: 03/25/2004  Findings: Prominence of the sulci, cisterns, and ventricles, in keeping with volume loss. There are subcortical and periventricular white matter hypodensities, a nonspecific finding most often seen with chronic microangiopathic changes.  There is no evidence for acute hemorrhage, overt hydrocephalus, mass lesion, or abnormal extra-axial fluid collection.  No definite CT evidence for acute cortical based (large artery) infarction. Remote lacunar infarction centered with the right basal ganglia. Hypoattenuation within the inferior left cerebellum may reflect volume averaging.  No associated mass effect.  Partially opacified left sphenoid chamber.  The visualized paranasal sinuses and mastoid air cells are otherwise predominately clear.  No displaced calvarial fracture.  IMPRESSION: Volume loss and  white matter changes.  No definite acute intracranial abnormality.  Hypoattenuation within the inferior left cerebellum, may reflect volume averaging. If there is clinical concern for acute ischemia, consider MRI follow-up.  Partially opacified left sphenoid chamber.  Correlate clinically if concern for sinusitis.  Original Report Authenticated By: Waneta Martins, M.D.   Mri Brain Without Contrast  04/22/2012  *RADIOLOGY REPORT*  Clinical  Data:  Fall.  Weakness.  Abnormal head CT.  MRI HEAD WITHOUT CONTRAST MRA HEAD WITHOUT CONTRAST  Technique: Multiplanar, multiecho pulse sequences of the brain and surrounding structures were obtained according to standard protocol without intravenous contrast.  Angiographic images of the head were obtained using MRA technique without contrast.  Comparison: 04/21/2012 head CT.  MRI HEAD  Findings:  Moderate sized acute non hemorrhagic infarct inferior left cerebellum and posterior lateral aspect of the left medulla.  Small focal area of blood breakdown products right anterior lower pons may be related to the prior hemorrhagic ischemia.  Small cavernoma not excluded.  Result of trauma felt to be less likely consideration otherwise no intracranial hemorrhage noted.  Remote tiny l right thalamic infarct and right lenticular nucleus infarct.  Mild to moderate small vessel disease type changes.  Global atrophy without hydrocephalus.  No intracranial mass lesion detected on this unenhanced exam.  Abnormal left vertebral artery.  Please see below.  Mild paranasal sinus mucosal thickening.  IMPRESSION: Moderate size acute non hemorrhagic infarct inferior left cerebellum and posterior lateral aspect of the left medulla.  MRA HEAD  Findings: Anterior circulation without medium or large size vessel significant stenosis or occlusion.  Mild branch vessel irregularity.  Nonvisualization of the left vertebral artery and left PICA. Dissection not excluded.  Mild irregularity without high-grade stenosis of the right vertebral artery or basilar artery.  Nonvisualization right PICA.  Small left AICA.  Bulge of the proximal left lateral basilar artery may represent the insertion site of the occluded left vertebral artery.  Posterior cerebral artery moderate tandem stenoses more notable on the right.  No aneurysm or vascular malformation noted.  IMPRESSION: Nonvisualization left vertebral artery and both PICAs.  Please see above.   Original Report Authenticated By: Fuller Canada, M.D.   Dg Chest Port 1 View  04/23/2012  *RADIOLOGY REPORT*  Clinical Data: Infiltrates.  PORTABLE CHEST - 1 VIEW  Comparison: 04/21/2012  Findings: Cardiomegaly.  Mild vascular congestion.  Improving aeration in the left base.  Minimal residual atelectasis.  Right lung is clear.  No effusions.  IMPRESSION: Improving aeration in the left base.  Minimal residual left base atelectasis.  Cardiomegaly, vascular congestion.  Original Report Authenticated By: Cyndie Chime, M.D.   Dg Chest Portable 1 View  04/21/2012  *RADIOLOGY REPORT*  Clinical Data: Chest pain and shortness of breath.  Atrial fibrillation.  PORTABLE CHEST - 1 VIEW  Comparison: 02/23/2012.  Findings: Interval enlargement of the cardiac silhouette.  Clear lungs with normal vascularity.  Stable minimal prominence of the interstitial markings.  Diffuse osteopenia.  IMPRESSION:  1.  Interval cardiomegaly. 2.  Stable minimal chronic interstitial lung disease.  Original Report Authenticated By: Darrol Angel, M.D.   Dg Swallowing Func-no Report  04/22/2012  CLINICAL DATA: dysphagia   FLUOROSCOPY FOR SWALLOWING FUNCTION STUDY:  Fluoroscopy was provided for swallowing function study, which was  administered by a speech pathologist.  Final results and recommendations  from this study are contained within the speech pathology report.     Mr Maxine Glenn Head/brain Wo Cm  04/22/2012  *RADIOLOGY  REPORT*  Clinical Data:  Fall.  Weakness.  Abnormal head CT.  MRI HEAD WITHOUT CONTRAST MRA HEAD WITHOUT CONTRAST  Technique: Multiplanar, multiecho pulse sequences of the brain and surrounding structures were obtained according to standard protocol without intravenous contrast.  Angiographic images of the head were obtained using MRA technique without contrast.  Comparison: 04/21/2012 head CT.  MRI HEAD  Findings:  Moderate sized acute non hemorrhagic infarct inferior left cerebellum and posterior lateral aspect of  the left medulla.  Small focal area of blood breakdown products right anterior lower pons may be related to the prior hemorrhagic ischemia.  Small cavernoma not excluded.  Result of trauma felt to be less likely consideration otherwise no intracranial hemorrhage noted.  Remote tiny l right thalamic infarct and right lenticular nucleus infarct.  Mild to moderate small vessel disease type changes.  Global atrophy without hydrocephalus.  No intracranial mass lesion detected on this unenhanced exam.  Abnormal left vertebral artery.  Please see below.  Mild paranasal sinus mucosal thickening.  IMPRESSION: Moderate size acute non hemorrhagic infarct inferior left cerebellum and posterior lateral aspect of the left medulla.  MRA HEAD  Findings: Anterior circulation without medium or large size vessel significant stenosis or occlusion.  Mild branch vessel irregularity.  Nonvisualization of the left vertebral artery and left PICA. Dissection not excluded.  Mild irregularity without high-grade stenosis of the right vertebral artery or basilar artery.  Nonvisualization right PICA.  Small left AICA.  Bulge of the proximal left lateral basilar artery may represent the insertion site of the occluded left vertebral artery.  Posterior cerebral artery moderate tandem stenoses more notable on the right.  No aneurysm or vascular malformation noted.  IMPRESSION: Nonvisualization left vertebral artery and both PICAs.  Please see above.  Original Report Authenticated By: Fuller Canada, M.D.    Medications: Scheduled Meds:    . aspirin  81 mg Per Tube Daily  . atorvastatin  10 mg Per Tube q1800  . diltiazem  60 mg Oral Q6H  . enoxaparin  30 mg Subcutaneous Q24H  . feeding supplement  30 mL Per Tube Daily  . insulin aspart  0-20 Units Subcutaneous Q4H  . irbesartan  150 mg Oral Daily  . levothyroxine  100 mcg Per Tube Q0600  . metoprolol  5 mg Intravenous Q6H  . DISCONTD: diltiazem  30 mg Oral Q8H  . DISCONTD: diltiazem   60 mg Oral Q6H  . DISCONTD: enoxaparin  40 mg Subcutaneous Q24H  . DISCONTD: simvastatin  20 mg Per Tube q1800   Continuous Infusions:    . feeding supplement (JEVITY 1.2 CAL) 1,000 mL (04/24/12 1208)   PRN Meds:.acetaminophen, acetaminophen, alum & mag hydroxide-simeth, HYDROcodone-acetaminophen, iohexol, ondansetron (ZOFRAN) IV, ondansetron  Assessment/Plan: Active Problems:  CVA (cerebral infarction)- multifocal- secondary to a-fib Panda tube for dysphagia PT/OT On ASA for now To get pradaxa after final route for po intake is established    Atrial fibrillation with RVR On cardizem around the clock l   Vulvar cancer, carcinoma S/p multiple surgeries. She will f/u with her oncologist as oupt   Diabetes mellitus Cont sliding scale- on insulin at home- sugars will likely increase when we start tube feeds   Hypertension On IV Metoprolol for now. Hold for bradycardia.   Hyperlipidemia Cont statin when Panda placed   LOS: 3 days   Luismanuel Corman [phone] 04/24/2012, 5:39 PM

## 2012-04-24 NOTE — Progress Notes (Signed)
Utilization Review Completed.[name] T5/12/2011   

## 2012-04-24 NOTE — Progress Notes (Signed)
Discussed with patient and husband at bedside that most likely will have bed availability for CIR tomorrow.  Aware that patient had an episode of increased HR and ? A-fib while working with therapies. Will f/u in am. Marina Goodell adm coordinator [phone].

## 2012-04-24 NOTE — Progress Notes (Signed)
Physical Therapy Treatment Patient Details Name: [name] MRN: [id number] DOB: [birth year] Today's Date: 04/24/2012 Time: 7829-5621 PT Time Calculation (min): 21 min  PT Assessment / Plan / Recommendation Comments on Treatment Session  Pt was limited in activity due to increased HR (156) during treatment. Nurse notified and pt was assist back to bed.    Follow Up Recommendations  Inpatient Rehab    Equipment Recommendations  Defer to next venue    Frequency Min 4X/week   Plan Discharge plan remains appropriate;Frequency remains appropriate    Precautions / Restrictions Precautions Precautions: Fall Restrictions Weight Bearing Restrictions: No   Pertinent Vitals/Pain BP 184/89 HR 142 RN made aware and paged     Mobility  Bed Mobility Bed Mobility: Rolling Right;Right Sidelying to Sit;Sitting - Scoot to [place] of Bed Rolling Right: 4: Min assist;With rail Right Sidelying to Sit: 4: Min assist;HOB flat Sitting - Scoot to [place] of Bed: 4: Min assist;With rail Details for Bed Mobility Assistance: Pt required assistance with LE throughout bed mobility. VC's for hand placement  Transfers Transfers: Sit to Stand;Stand to Sit Sit to Stand: 6: Modified independent (Device/Increase time);With upper extremity assist;4: Min guard;From bed Stand to Sit: 6: Modified independent (Device/Increase time);Without upper extremity assist;4: Min guard;To bed Stand Pivot Transfers: 6: Modified independent (Device/Increase time) Details for Transfer Assistance: Pt required VC's for hand placement and to keep eyes open during transfer. Min guard A for safety due to patient feeling lightheaded.  Ambulation/Gait Stairs: No Wheelchair Mobility Wheelchair Mobility: No Modified Rankin (Stroke Patients Only) Modified Rankin: Moderately severe disability    Exercises General Exercises - Lower Extremity Mini-Sqauts: AAROM;Other (comment) (x4 )   PT Goals Acute Rehab PT Goals PT Goal: Supine/Side to  Sit - Progress: Progressing toward goal PT Goal: Sit at [facility] Of Bed - Progress: Progressing toward goal  Visit Information  Last PT Received On: 04/24/12 Assistance Needed: +1    Subjective Data      Cognition  Overall Cognitive Status: Appears within functional limits for tasks assessed/performed Arousal/Alertness: Awake/alert Orientation Level: Appears intact for tasks assessed Behavior During Session: [facility] for tasks performed    Balance  Static Standing Balance Static Standing - Balance Support: Bilateral upper extremity supported Static Standing - Level of Assistance: 6: Modified independent (Device/Increase time) Static Standing - Comment/# of Minutes: pt sat EOB for ~5 during vitals with nurse  End of Session      Tamera Stands 04/24/2012, 3:24 PM

## 2012-04-24 NOTE — Progress Notes (Addendum)
Call back received from Doran, NP. No new orders given. Will continue to monitor pt closely.

## 2012-04-25 ENCOUNTER — Inpatient Hospital Stay (HOSPITAL_COMMUNITY)
Admission: RE | Admit: 2012-04-25 | Discharge: 2012-05-10 | DRG: 945 | Disposition: A | Discharge status: Skilled Nursing Facility | Payer: Medicare Other | Source: Ambulatory Visit | Attending: Physical Medicine & Rehabilitation | Admitting: Physical Medicine & Rehabilitation

## 2012-04-25 DIAGNOSIS — I639 Cerebral infarction, unspecified: Secondary | ICD-10-CM

## 2012-04-25 DIAGNOSIS — T83511A Infection and inflammatory reaction due to indwelling urethral catheter, initial encounter: Secondary | ICD-10-CM | POA: Diagnosis present

## 2012-04-25 DIAGNOSIS — E669 Obesity, unspecified: Secondary | ICD-10-CM | POA: Diagnosis present

## 2012-04-25 DIAGNOSIS — R1310 Dysphagia, unspecified: Secondary | ICD-10-CM | POA: Diagnosis present

## 2012-04-25 DIAGNOSIS — Y846 Urinary catheterization as the cause of abnormal reaction of the patient, or of later complication, without mention of misadventure at the time of the procedure: Secondary | ICD-10-CM | POA: Diagnosis present

## 2012-04-25 DIAGNOSIS — Z8544 Personal history of malignant neoplasm of other female genital organs: Secondary | ICD-10-CM

## 2012-04-25 DIAGNOSIS — Z5189 Encounter for other specified aftercare: Secondary | ICD-10-CM

## 2012-04-25 DIAGNOSIS — N390 Urinary tract infection, site not specified: Secondary | ICD-10-CM | POA: Diagnosis present

## 2012-04-25 DIAGNOSIS — R279 Unspecified lack of coordination: Secondary | ICD-10-CM | POA: Diagnosis present

## 2012-04-25 DIAGNOSIS — D649 Anemia, unspecified: Secondary | ICD-10-CM

## 2012-04-25 DIAGNOSIS — I6340 Cerebral infarction due to embolism of unspecified cerebral artery: Secondary | ICD-10-CM

## 2012-04-25 DIAGNOSIS — I63219 Cerebral infarction due to unspecified occlusion or stenosis of unspecified vertebral arteries: Secondary | ICD-10-CM | POA: Diagnosis present

## 2012-04-25 DIAGNOSIS — I69993 Ataxia following unspecified cerebrovascular disease: Secondary | ICD-10-CM

## 2012-04-25 DIAGNOSIS — I4891 Unspecified atrial fibrillation: Secondary | ICD-10-CM | POA: Diagnosis present

## 2012-04-25 DIAGNOSIS — I10 Essential (primary) hypertension: Secondary | ICD-10-CM | POA: Diagnosis present

## 2012-04-25 DIAGNOSIS — IMO0001 Reserved for inherently not codable concepts without codable children: Secondary | ICD-10-CM | POA: Diagnosis present

## 2012-04-25 DIAGNOSIS — Z931 Gastrostomy status: Secondary | ICD-10-CM

## 2012-04-25 DIAGNOSIS — E1165 Type 2 diabetes mellitus with hyperglycemia: Secondary | ICD-10-CM

## 2012-04-25 DIAGNOSIS — Z8673 Personal history of transient ischemic attack (TIA), and cerebral infarction without residual deficits: Secondary | ICD-10-CM

## 2012-04-25 DIAGNOSIS — E039 Hypothyroidism, unspecified: Secondary | ICD-10-CM | POA: Diagnosis present

## 2012-04-25 DIAGNOSIS — E785 Hyperlipidemia, unspecified: Secondary | ICD-10-CM | POA: Diagnosis present

## 2012-04-25 DIAGNOSIS — R5381 Other malaise: Secondary | ICD-10-CM | POA: Diagnosis present

## 2012-04-25 DIAGNOSIS — I69991 Dysphagia following unspecified cerebrovascular disease: Secondary | ICD-10-CM

## 2012-04-25 LAB — GLUCOSE, CAPILLARY
Glucose-Capillary: 196 mg/dL — ABNORMAL HIGH (ref 70–99)
Glucose-Capillary: 235 mg/dL — ABNORMAL HIGH (ref 70–99)
Glucose-Capillary: 206 mg/dL — ABNORMAL HIGH (ref 70–99)
Glucose-Capillary: 154 mg/dL — ABNORMAL HIGH (ref 70–99)

## 2012-04-25 MED ORDER — ATORVASTATIN CALCIUM 10 MG PO TABS
10.0000 mg | ORAL_TABLET | Freq: Every day | ORAL | Status: DC
  Administered 2012-04-25 – 2012-05-09 (×15): 10 mg
  Filled 2012-04-25 (×17): qty 1

## 2012-04-25 MED ORDER — ASPIRIN 81 MG PO CHEW
81.0000 mg | CHEWABLE_TABLET | Freq: Every day | ORAL | Status: DC
  Administered 2012-04-26 – 2012-05-05 (×9): 81 mg
  Filled 2012-04-25 (×10): qty 1

## 2012-04-25 MED ORDER — ONDANSETRON HCL 4 MG PO TABS: 4.0000 mg | ORAL_TABLET | Freq: Four times a day (QID) | ORAL | Status: DC | PRN

## 2012-04-25 MED ORDER — BIOTENE DRY MOUTH MT LIQD
15.0000 mL | Freq: Two times a day (BID) | OROMUCOSAL | Status: DC
  Administered 2012-04-25 – 2012-05-05 (×14): 15 mL via OROMUCOSAL

## 2012-04-25 MED ORDER — ENOXAPARIN SODIUM 30 MG/0.3ML ~~LOC~~ SOLN
30.0000 mg | SUBCUTANEOUS | Status: DC
  Administered 2012-04-26 – 2012-05-01 (×6): 30 mg via SUBCUTANEOUS
  Filled 2012-04-25 (×7): qty 0.3

## 2012-04-25 MED ORDER — INSULIN ASPART 100 UNIT/ML ~~LOC~~ SOLN
0.0000 [IU] | SUBCUTANEOUS | Status: DC
  Administered 2012-04-25 – 2012-04-26 (×2): 4 [IU] via SUBCUTANEOUS
  Administered 2012-04-26 (×3): 7 [IU] via SUBCUTANEOUS
  Administered 2012-04-26 (×2): 11 [IU] via SUBCUTANEOUS
  Administered 2012-04-27 (×2): 4 [IU] via SUBCUTANEOUS
  Administered 2012-04-27 (×3): 7 [IU] via SUBCUTANEOUS
  Administered 2012-04-27: 11 [IU] via SUBCUTANEOUS
  Administered 2012-04-28: 4 [IU] via SUBCUTANEOUS
  Administered 2012-04-28 (×2): 11 [IU] via SUBCUTANEOUS
  Administered 2012-04-28: 7 [IU] via SUBCUTANEOUS
  Administered 2012-04-28: 4 [IU] via SUBCUTANEOUS
  Administered 2012-04-28: 7 [IU] via SUBCUTANEOUS
  Administered 2012-04-29 (×2): 4 [IU] via SUBCUTANEOUS
  Administered 2012-04-29: 7 [IU] via SUBCUTANEOUS
  Administered 2012-04-29: 4 [IU] via SUBCUTANEOUS
  Administered 2012-04-29: 11 [IU] via SUBCUTANEOUS
  Administered 2012-04-29 – 2012-04-30 (×2): 7 [IU] via SUBCUTANEOUS
  Administered 2012-04-30: 15 [IU] via SUBCUTANEOUS
  Administered 2012-04-30: 11 [IU] via SUBCUTANEOUS
  Administered 2012-04-30: 4 [IU] via SUBCUTANEOUS
  Administered 2012-04-30: 7 [IU] via SUBCUTANEOUS
  Administered 2012-04-30: 3 [IU] via SUBCUTANEOUS
  Administered 2012-05-01 (×2): 7 [IU] via SUBCUTANEOUS
  Administered 2012-05-01: 3 [IU] via SUBCUTANEOUS
  Administered 2012-05-01 (×2): 7 [IU] via SUBCUTANEOUS
  Administered 2012-05-01: 11 [IU] via SUBCUTANEOUS
  Administered 2012-05-02: 5 [IU] via SUBCUTANEOUS
  Administered 2012-05-02: 4 [IU] via SUBCUTANEOUS
  Administered 2012-05-02: 7 [IU] via SUBCUTANEOUS
  Administered 2012-05-02: 4 [IU] via SUBCUTANEOUS
  Administered 2012-05-02: 7 [IU] via SUBCUTANEOUS
  Administered 2012-05-02: 11 [IU] via SUBCUTANEOUS
  Administered 2012-05-03: 7 [IU] via SUBCUTANEOUS
  Administered 2012-05-03: 4 [IU] via SUBCUTANEOUS
  Administered 2012-05-04: 11 [IU] via SUBCUTANEOUS
  Administered 2012-05-04: 3 [IU] via SUBCUTANEOUS
  Administered 2012-05-04 (×3): 7 [IU] via SUBCUTANEOUS
  Administered 2012-05-04 – 2012-05-05 (×2): 4 [IU] via SUBCUTANEOUS
  Administered 2012-05-05 – 2012-05-06 (×5): 7 [IU] via SUBCUTANEOUS
  Administered 2012-05-06 (×2): 4 [IU] via SUBCUTANEOUS
  Administered 2012-05-06 (×2): 7 [IU] via SUBCUTANEOUS
  Administered 2012-05-06 – 2012-05-07 (×2): 4 [IU] via SUBCUTANEOUS
  Administered 2012-05-07: 7 [IU] via SUBCUTANEOUS
  Administered 2012-05-07: 21:00:00 via SUBCUTANEOUS
  Administered 2012-05-07: 7 [IU] via SUBCUTANEOUS
  Administered 2012-05-07 (×2): 4 [IU] via SUBCUTANEOUS
  Administered 2012-05-08 (×2): 3 [IU] via SUBCUTANEOUS
  Administered 2012-05-08 (×3): 4 [IU] via SUBCUTANEOUS
  Administered 2012-05-09: 7 [IU] via SUBCUTANEOUS
  Administered 2012-05-09: 4 [IU] via SUBCUTANEOUS
  Administered 2012-05-09: 01:00:00 via SUBCUTANEOUS
  Administered 2012-05-09: 4 [IU] via SUBCUTANEOUS
  Administered 2012-05-10: 3 [IU] via SUBCUTANEOUS
  Administered 2012-05-10: 4 [IU] via SUBCUTANEOUS

## 2012-04-25 MED ORDER — DILTIAZEM HCL 60 MG PO TABS
60.0000 mg | ORAL_TABLET | Freq: Four times a day (QID) | ORAL | Status: DC
  Administered 2012-04-25 – 2012-04-26 (×3): 60 mg via ORAL
  Filled 2012-04-25 (×7): qty 1

## 2012-04-25 MED ORDER — BIOTENE DRY MOUTH MT LIQD
15.0000 mL | Freq: Two times a day (BID) | OROMUCOSAL | Status: DC
  Administered 2012-04-25 (×2): 15 mL via OROMUCOSAL

## 2012-04-25 MED ORDER — IRBESARTAN 150 MG PO TABS
150.0000 mg | ORAL_TABLET | Freq: Every day | ORAL | Status: DC
  Administered 2012-04-26 – 2012-05-10 (×14): 150 mg via ORAL
  Filled 2012-04-25 (×21): qty 1

## 2012-04-25 MED ORDER — FREE WATER
160.0000 mL | Freq: Four times a day (QID) | Status: DC
  Administered 2012-04-25 (×2): 160 mL

## 2012-04-25 MED ORDER — CHLORHEXIDINE GLUCONATE 0.12 % MT SOLN
15.0000 mL | Freq: Two times a day (BID) | OROMUCOSAL | Status: DC
  Administered 2012-04-25: 15 mL via OROMUCOSAL
  Filled 2012-04-25 (×4): qty 15

## 2012-04-25 MED ORDER — LEVOTHYROXINE SODIUM 100 MCG PO TABS
100.0000 ug | ORAL_TABLET | Freq: Every day | ORAL | Status: DC
  Administered 2012-04-26 – 2012-05-10 (×14): 100 ug
  Filled 2012-04-25 (×17): qty 1

## 2012-04-25 MED ORDER — BIOTENE DRY MOUTH MT LIQD
15.0000 mL | Freq: Two times a day (BID) | OROMUCOSAL | Status: DC
Start: 2012-04-25 — End: 2012-04-25

## 2012-04-25 MED ORDER — ACETAMINOPHEN 325 MG PO TABS
325.0000 mg | ORAL_TABLET | ORAL | Status: DC | PRN
  Administered 2012-04-30 – 2012-05-03 (×2): 650 mg via ORAL
  Filled 2012-04-25 (×3): qty 2

## 2012-04-25 MED ORDER — JEVITY 1.2 CAL PO LIQD
1000.0000 mL | ORAL | Status: DC
  Administered 2012-04-26: 1000 mL
  Administered 2012-04-27: 17:00:00
  Administered 2012-04-28 – 2012-04-29 (×2): 1000 mL
  Filled 2012-04-25 (×6): qty 1000

## 2012-04-25 MED ORDER — CHLORHEXIDINE GLUCONATE 0.12 % MT SOLN
15.0000 mL | Freq: Two times a day (BID) | OROMUCOSAL | Status: DC
  Administered 2012-04-25 – 2012-05-10 (×29): 15 mL via OROMUCOSAL
  Filled 2012-04-25 (×33): qty 15

## 2012-04-25 MED ORDER — ONDANSETRON HCL 4 MG/2ML IJ SOLN: 4.0000 mg | Freq: Four times a day (QID) | INTRAMUSCULAR | Status: DC | PRN

## 2012-04-25 MED ORDER — PRO-STAT SUGAR FREE PO LIQD
30.0000 mL | Freq: Every day | ORAL | Status: DC
  Administered 2012-04-26 – 2012-05-10 (×14): 30 mL
  Filled 2012-04-25 (×16): qty 30

## 2012-04-25 NOTE — Progress Notes (Signed)
Stroke Team Progress Note  HISTORY [name] is an 78 y.o. female who has been under treatment for recurrent vulvar cancer. Due to this she has been spending a lot of time in bed. Went out to eat with her husband on 4/27. About 6 hours after dinner had acute onset of nausea and vomiting. She had to go to bed due to weakness and eventually rolled off of the bed. She was able to be helped back to bed after a time. Was no better in the morning and presented for evaluation. Work up included a MRI of the brain that shows an acute inferior left cerebellar and posterior lateral left medullary infarcts. Patient was also found to be in atrial fibrillation. Patient was not a TPA candidate secondary to delay in arrival. She was admitted for further evaluation and treatment.  SUBJECTIVE Anticipate discharge to rehab today.stable no complaints.  OBJECTIVE Most recent Vital Signs: Filed Vitals:   04/24/12 2330 04/25/12 0231 04/25/12 0559 04/25/12 0948  BP: 112/71 124/68 117/78 128/98  Pulse: 93 78 81 98  Temp: 97.4 F (36.3 C) 97.6 F (36.4 C) 97.3 F (36.3 C) 97.4 F (36.3 C)  TempSrc: Oral Oral Oral Oral  Resp: 18 18 18 18   Height:   5\' 3"  (1.6 m)   Weight:   52.6 kg (115 lb 15.4 oz)   SpO2: 94% 94% 95% 95%   CBG (last 3)  Basename 04/25/12 0747 04/25/12 0354 04/24/12 2358  GLUCAP 196* 212* 172*   Intake/Output from previous day: 05/01 0701 - 05/02 0700 In: 400 [NG/GT:400] Out: 400 [Urine:400]  IV Fluid Intake:     . feeding supplement (JEVITY 1.2 CAL) 1,000 mL (04/24/12 1208)   MEDICATIONS    . antiseptic oral rinse  15 mL Mouth Rinse q12n4p  . aspirin  81 mg Per Tube Daily  . atorvastatin  10 mg Per Tube q1800  . chlorhexidine  15 mL Mouth Rinse BID  . diltiazem  60 mg Oral Q6H  . enoxaparin  30 mg Subcutaneous Q24H  . feeding supplement  30 mL Per Tube Daily  . insulin aspart  0-20 Units Subcutaneous Q4H  . irbesartan  150 mg Oral Daily  . levothyroxine  100 mcg Per Tube  Q0600  . metoprolol  5 mg Intravenous Q6H  . DISCONTD: diltiazem  30 mg Oral Q8H  . DISCONTD: diltiazem  60 mg Oral Q6H  . DISCONTD: enoxaparin  40 mg Subcutaneous Q24H  . DISCONTD: simvastatin  20 mg Per Tube q1800   PRN:  acetaminophen, acetaminophen, alum & mag hydroxide-simeth, HYDROcodone-acetaminophen, ondansetron (ZOFRAN) IV, ondansetron  Diet:  NPO  Activity:   Bathroom privileges with assistance DVT Prophylaxis:  Lovenox 40 mg sq daily   CLINICALLY SIGNIFICANT STUDIES CBC    Component Value Date/Time   WBC 10.3 04/23/2012 0404   RBC 3.54* 04/23/2012 0404   HGB 11.2* 04/23/2012 0404   HCT 33.5* 04/23/2012 0404   PLT 134* 04/23/2012 0404   MCV 94.6 04/23/2012 0404   MCH 31.6 04/23/2012 0404   MCHC 33.4 04/23/2012 0404   RDW 14.5 04/23/2012 0404   LYMPHSABS 1.2 02/23/2012 1145   MONOABS 0.5 02/23/2012 1145   EOSABS 0.2 02/23/2012 1145   BASOSABS 0.0 02/23/2012 1145   CMP    Component Value Date/Time   NA 140 04/23/2012 0404   K 3.6 04/23/2012 0404   CL 102 04/23/2012 0404   CO2 28 04/23/2012 0404   GLUCOSE 104* 04/23/2012 0404   BUN  24* 04/23/2012 0404   CREATININE 1.55* 04/23/2012 0404   CALCIUM 9.2 04/23/2012 0404   PROT 7.1 04/21/2012 1734   ALBUMIN 3.7 04/21/2012 1734   AST 20 04/21/2012 1734   ALT 18 04/21/2012 1734   ALKPHOS 76 04/21/2012 1734   BILITOT 0.8 04/21/2012 1734   GFRNONAA 30* 04/23/2012 0404   GFRAA 35* 04/23/2012 0404   COAGS Lab Results  Component Value Date   INR 1.02 04/21/2012   Lipid Panel    Component Value Date/Time   CHOL 145 04/22/2012 0405   TRIG 129 04/22/2012 0405   HDL 54 04/22/2012 0405   CHOLHDL 2.7 04/22/2012 0405   VLDL 26 04/22/2012 0405   LDLCALC 65 04/22/2012 0405   HgbA1C  Lab Results  Component Value Date   HGBA1C 7.5* 04/22/2012   Cardiac Panel (last 3 results) No results found for this basename: CKTOTAL:3,CKMB:3,TROPONINI:3,RELINDX:3 in the last 72 hours Urinalysis    Component Value Date/Time   COLORURINE YELLOW 04/24/2012 0835    APPEARANCEUR CLOUDY* 04/24/2012 0835   LABSPEC 1.017 04/24/2012 0835   PHURINE 6.0 04/24/2012 0835   GLUCOSEU NEGATIVE 04/24/2012 0835   HGBUR TRACE* 04/24/2012 0835   BILIRUBINUR NEGATIVE 04/24/2012 0835   KETONESUR 40* 04/24/2012 0835   PROTEINUR 30* 04/24/2012 0835   UROBILINOGEN 1.0 04/24/2012 0835   NITRITE POSITIVE* 04/24/2012 0835   LEUKOCYTESUR MODERATE* 04/24/2012 0835   Urine Drug Screen  No results found for this basename: labopia,  cocainscrnur,  labbenz,  amphetmu,  thcu,  labbarb    Alcohol Level No results found for this basename: eth   CT of the brain  04/21/2012  Volume loss and white matter changes.  No definite acute intracranial abnormality.  Hypoattenuation within the inferior left cerebellum, may reflect volume averaging.   MRI of the brain  04/22/2012  Moderate size acute non hemorrhagic infarct inferior left cerebellum and posterior lateral aspect of the left medulla.   MRA of the brain  04/22/2012   Nonvisualization left vertebral artery and both PICAs.    2D Echocardiogram  EF 55-60% with no source of embolus. Mild AS.  Carotid Doppler  Bilateral: No evidence of hemodynamically significant internal carotid artery stenosis. Vertebral artery flow is antegrade. Right CEA patent.  CXR  04/21/2012  :  1.  Interval cardiomegaly. 2.  Stable minimal chronic interstitial lung disease.     EKG  atrial fibrillation, rate 139.   Therapy Recommendations PT CIR, OT ---, ST NPO  Physical Exam    Pleasant elderly Caucasian lady not in distress.Awake alert. Afebrile. Head is nontraumatic. Neck is supple without bruit. Hearing is decreased. Cardiac exam no murmur or gallop. Lungs are clear to auscultation. Distal pulses are well felt.   Neurological Exam ;Awake  Alert oriented x 3. Normal speech and language.eye movements full without nystagmus but saccadic dysmetria to left.Mild left Face asymmetry.. Tongue midline. Normal strength, tone, reflexes and coordination.Mild truncal ataxia.. Normal  sensation. Gait deferred.   ASSESSMENT [name] is a 78 y.o. female with a inferior left cerebellum and posterior lateral aspect of the left medulla infarct secondary to left vertebral artery occlusion, likely a result of atrial fibrillation (previous dx by card last fall). On aspirin 81 mg orally every day prior to admission; unsure why not on anticoagulant. Now on aspirin 300 mg rectally every day for secondary stroke prevention. Patient with resultant dysarthria, dysphagia, dizziness, vertigo, poor cough/gag. nausea/vomiting resolved  -hypotension on cardizem. -atrial fibrillation, followed by Dr. Anne Fu. On ASA  alone PTA. Neuro recommends anticoagulant once she can swallow or has permanent feeding tube. -leukocytosis, improved 10.3 -low TSH -diabetes, poorly controlled, A1c 7.5 -hypertension -hyperlipidemia -vulvar carcinoma 10/2011, stable -stroke, right brain 05/2000 -thrombocytopenia, Plt 134 -UTI Hospital day # 4  TREATMENT/PLAN -Continue aspirin 300 mg rectally every day for secondary stroke prevention while NPO. Once able to swallow or has permanent feeding tube, recommend Pradaxa or other anticoagulant vs adding Plavix to low dose aspirin if unable to tolerate anticoagulant. -agree with rehab transfer. Primary team to address UTI -Stroke Team will sign off.Call for questions. Joaquin Music, ANP-BC, GNP-BC [facility] Pager: [phone] 04/25/2012 10:35 AM  Dr. Delia Heady, Stroke Center Medical Director, has personally reviewed chart, pertinent data, examined the patient and developed the plan of care. Pager:  [phone]

## 2012-04-25 NOTE — Progress Notes (Signed)
Pt admitted to room 4032 from 3000 via bed. Pt oriented to rehab routines, call light, team conference, therapy schedule,meal times, lead nursing, lead nurse Gracie,safety video, plan and agreement,pain goal 5 or less, butterfly wish and preferred name, reviewed Stroke education booklet, at home pt checks her blood glucose four times a day and uses Levimer and Novolog pen at home, denies any questions/concerns, does not follow a diabetic diet, follow up with MD regularly, see [facility] for details of assessment, pt aware she is NPO, not to pull on Panda tube, selof suctions secretions with [place], secretions are clear. Will continue to monitor. Roberts-VonC[place], Merwin Breden Elon Jester

## 2012-04-25 NOTE — PMR Pre-admission (Signed)
PMR Admission Coordinator Pre-Admission Assessment  Patient: [name] is an 78 y.o., female MRN: [id number] DOB: 04-06-1926 Height: 5\' 3"  (160 cm) Weight: 52.6 kg (115 lb 15.4 oz)  Insurance Information  PRIMARY: Medicare      Policy#: [id number]      Subscriber: patient CM Name:       Phone#:      Fax#:  Pre-Cert#:       Employer: retired Benefits:  Phone #: [employer]     Name:  Eff. Date: A and B 08/25/93     Deduct: $1184      Out of Pocket Max: 0      Life Max: 0 CIR: 100%      SNF: LBD=03/01/12, 100 days Outpatient: 80     Co-Pay: 20% Home Health: 100%      Co-Pay: 0% DME: 80%     Co-Pay: 20% Providers: patient's choice SECONDARY: [employer]      Policy#: [id number]      Subscriber:  CM Name:       Phone#:      Fax#:  Pre-Cert#:       Employer:  Benefits:  Phone #:      Name:  Eff. Date:      Deduct:       Out of Pocket Max:       Life Max:  CIR:       SNF:  Outpatient:      Co-Pay:  Home Health:       Co-Pay:  DME:      Co-Pay:   Medicaid Application Date:       Case Manager:  Disability Application Date:       Case Worker:   Emergency Contact Information Contact Information    Name Relation Home Work Mobile   Gohr,Russell Spouse [phone]  [phone]     Current Medical History  Patient Admitting Diagnosis: Left posterior circulation infarct with hemitaxia History of Present Illness: 78 year old right-handed female with history of hypertension diabetes mellitus as well as right brain stroke 2001. Patient with recent radical vulvectomy 8 weeks ago for recurrent vulvar cancer. She is currently not undergoing chemotherapy or radiation therapy. Admitted 04/21/2012 after episode of nausea vomiting as well as a fall when she attempted to get out of bed. Her husband called 911 she was brought to the emergency room. In the emergency room she was found to have a true fibrillation with RVR. MRI of the brain showed moderate-sized acute nonhemorrhagic infarction in inferior left  cerebellum and posterior lateral aspect of the left medulla. Echocardiogram with ejection fraction 55-60% without embolus and mild aortic stenosis. Carotid Dopplers with no ICA stenosis. Neurology consulted presently on aspirin and subcutaneous Lovenox however they are considering pradaxa. Cardiology ( DR.Skains) follow for atrial fibrillation and present plans to continue aspirin therapy until able to take by mouth or permanent tube access and then consider Pradaxa versus Plavix. Patient is presently n.p.o. after a failed modified barium swallow 04/22/2012 and nasogastric tube placed for nutritional support. Physical therapy evaluation completed 04/22/12 with rec of CIR.  Total: 2     Past Medical History  Past Medical History  Diagnosis Date  . Diabetes mellitus   . Hypertension   . Hyperlipidemia   . Obesity   . Hypothyroidism   . Vulvar cancer, carcinoma 11/03/2011  . Stroke 05/2000    right brain CVA pre H&P 2001  . Atrial fibrillation     Family History  family history includes  Diabetes type II in an unspecified family member.  Prior Rehab/Hospitalizations: no prior CIR stays   Current Medications  Current facility-administered medications:acetaminophen (TYLENOL) suppository 650 mg, 650 mg, Rectal, Q6H PRN, Gery Pray, MD;  acetaminophen (TYLENOL) tablet 650 mg, 650 mg, Oral, Q6H PRN, Gery Pray, MD;  alum & mag hydroxide-simeth (MAALOX/MYLANTA) 200-200-20 MG/5ML suspension 30 mL, 30 mL, Oral, Q6H PRN, Gery Pray, MD;  antiseptic oral rinse (BIOTENE) solution 15 mL, 15 mL, Mouth Rinse, q12n4p, Sorin C Laza, MD aspirin chewable tablet 81 mg, 81 mg, Per Tube, Daily, Calvert Cantor, MD, 81 mg at 04/24/12 1224;  atorvastatin (LIPITOR) tablet 10 mg, 10 mg, Per Tube, q1800, Sorin Luanne Bras, MD, 10 mg at 04/24/12 1755;  chlorhexidine (PERIDEX) 0.12 % solution 15 mL, 15 mL, Mouth Rinse, BID, Sorin C Laza, MD, 15 mL at 04/25/12 0842;  diltiazem (CARDIZEM) tablet 60 mg, 60 mg, Oral, Q6H,  Sorin C Laza, MD, 60 mg at 04/25/12 0528 enoxaparin (LOVENOX) injection 30 mg, 30 mg, Subcutaneous, Q24H, Sorin C Laza, MD;  feeding supplement (JEVITY 1.2 CAL) liquid 1,000 mL, 1,000 mL, Per Tube, Continuous, Heather Cornelison Pitts, RD, Last Rate: 45 mL/hr at 04/24/12 1208, 1,000 mL at 04/24/12 1208;  feeding supplement (PRO-STAT SUGAR FREE 64) liquid 30 mL, 30 mL, Per Tube, Daily, Heather Cornelison Pitts, RD HYDROcodone-acetaminophen (NORCO) 5-325 MG per tablet 1-2 tablet, 1-2 tablet, Oral, Q4H PRN, Debby Crosley, MD;  insulin aspart (novoLOG) injection 0-20 Units, 0-20 Units, Subcutaneous, Q4H, Lonia Blood, MD, 4 Units at 04/25/12 [phone];  iohexol (OMNIPAQUE) 300 MG/ML solution 10 mL, 10 mL, Other, Once PRN, Medication Radiologist, MD, 10 mL at 04/24/12 1030 irbesartan (AVAPRO) tablet 150 mg, 150 mg, Oral, Daily, Sorin C Laza, MD, 150 mg at 04/24/12 1224;  levothyroxine (SYNTHROID, LEVOTHROID) tablet 100 mcg, 100 mcg, Per Tube, Q0600, Calvert Cantor, MD, 100 mcg at 04/25/12 0528;  metoprolol (LOPRESSOR) injection 5 mg, 5 mg, Intravenous, Q6H, Lonia Blood, MD, 5 mg at 04/25/12 0528;  ondansetron (ZOFRAN) injection 4 mg, 4 mg, Intravenous, Q6H PRN, Debby Crosley, MD ondansetron (ZOFRAN) tablet 4 mg, 4 mg, Oral, Q6H PRN, Gery Pray, MD;  DISCONTD: diltiazem (CARDIZEM) tablet 30 mg, 30 mg, Oral, Q8H, Sorin C Laza, MD, 30 mg at 04/24/12 1225;  DISCONTD: diltiazem (CARDIZEM) tablet 60 mg, 60 mg, Oral, Q6H, Sorin C Laza, MD;  DISCONTD: enoxaparin (LOVENOX) injection 40 mg, 40 mg, Subcutaneous, Q24H, Debby Crosley, MD, 40 mg at 04/24/12 1229 DISCONTD: simvastatin (ZOCOR) tablet 20 mg, 20 mg, Per Tube, q1800, Calvert Cantor, MD  Patients Current Diet: NPO with Jevity for tube feeds via Panda  Precautions / Restrictions Precautions Precautions: Fall Precautions/Special Needs: Swallowing Precaution Comments: NPO for dysphagia, Panda tube placed 04/24/12 Restrictions Weight Bearing Restrictions: No     Prior Activity Level Household: active prior to surgery 8weeks ago, but house bound for last 8 weeks Journalist, newspaper / Equipment Home Assistive Devices/Equipment: Eyeglasses;Walker (specify type) Home Adaptive Equipment: Bedside commode/3-in-1;Tub transfer bench;Walker - rolling;Straight cane  Prior Functional Level Prior Function Level of Independence: Independent with assistive device(s) Able to Take Stairs?: Yes Driving: No Vocation: Retired  Current Functional Level Cognition  Arousal/Alertness: Awake/alert Overall Cognitive Status: Appears within functional limits for tasks assessed/performed Orientation Level: Oriented X4 Cognition - Other Comments: not specifically tested, but conversation normal history reported confirmed by family.      Sensation       Coordination       ADLs       Mobility  Bed Mobility: Rolling Right;Right Sidelying to Sit;Sitting - Scoot to Edge of Bed Rolling Right: 4: Min assist;With rail Right Sidelying to Sit: 4: Min assist;HOB flat Supine to Sit: 5: Supervision;HOB flat;With rails Sitting - Scoot to Edge of Bed: 4: Min assist;With rail Sit to Supine: 3: Mod assist;With rail;HOB flat    Transfers  Transfers: Sit to Stand;Stand to Sit Sit to Stand: 6: Modified independent (Device/Increase time);With upper extremity assist;4: Min guard;From bed Stand to Sit: 6: Modified independent (Device/Increase time);Without upper extremity assist;4: Min guard;To bed Stand Pivot Transfers: 6: Modified independent (Device/Increase time)    Ambulation / Gait / Stairs / Wheelchair Mobility  Ambulation/Gait Ambulation/Gait Assistance: 4: Min Environmental consultant (Feet): 5 Feet Assistive device: Rolling walker Ambulation/Gait Assistance Details: VC for safety with RW as well as proper cueing. Pt with no complaints at first, and then instant complaints of dizziness and need to sit down immediately. Pt carefully assisted onto edge of bed  with max assist for control Gait Pattern: Step-to pattern;Decreased hip/knee flexion - left;Decreased hip/knee flexion - right;Decreased stride length;Narrow base of support Gait velocity: decreased gait speed Stairs: No Wheelchair Mobility Wheelchair Mobility: No    Posture / Balance Static Sitting Balance Static Sitting - Balance Support: Bilateral upper extremity supported;Feet supported Static Sitting - Level of Assistance: 5: Stand by assistance Static Sitting - Comment/# of Minutes: 10-15 mins.  cues for finding a target to quiet dizziness and for midline (R lean in sitting).   Static Standing Balance Static Standing - Balance Support: Bilateral upper extremity supported Static Standing - Level of Assistance: 6: Modified independent (Device/Increase time) Static Standing - Comment/# of Minutes: pt sat EOB for ~5 during vitals with nurse     Previous Home Environment Living Arrangements: Spouse/significant other Lives With: Spouse Type of Home: House Home Layout: Multi-level (split level) Alternate Level Stairs-Rails: Right;Left Alternate Level Stairs-Number of Steps: 2 Home Access: Stairs to enter Entrance Stairs-Rails: None Entrance Stairs-Number of Steps: 2 Bathroom Shower/Tub: Counselling psychologist: No Home Care Services: No Additional Comments: prior to admission she would use the straight cane on occations when walking out of the house  Discharge Living Setting Plans for Discharge Living Setting: Patient's home;House;Lives with (comment) (spouse) Type of Home at Discharge: House Discharge Home Layout: Multi-level (split level) Alternate Level Stairs-Rails: Right;Left Alternate Level Stairs-Number of Steps: 2 Discharge Home Access: Stairs to enter Entrance Stairs-Rails: None Entrance Stairs-Number of Steps: 2 Discharge Bathroom Shower/Tub: Tub/shower unit;Curtain Discharge Bathroom Toilet: Standard Discharge  Bathroom Accessibility: No Do you have any problems obtaining your medications?: No  Social/Family/Support Systems Patient Roles: Spouse;Parent Contact Information: home [phone] Anticipated Caregiver: husband Anticipated Caregiver's Contact Information: cell [phone], work [phone] Ability/Limitations of Caregiver: walks with cane, works Thurs,Fri, Sat- dtr can help as needed Caregiver Availability: 24/7 Discharge Plan Discussed with Primary Caregiver: Yes Is Caregiver In Agreement with Plan?: Yes Does Caregiver/Family have Issues with Lodging/Transportation while Pt is in Rehab?: No  Goals/Additional Needs Patient/Family Goal for Rehab: supervision PT, Supervision OT, mod I SLP Expected length of stay: 2 weeks Cultural Considerations: none Dietary Needs: NPO, Panda tube feeds-Jevity Equipment Needs: to be determined Pt/Family Agrees to Admission and willing to participate: Yes Program Orientation Provided & Reviewed with Pt/Caregiver Including Roles  & Responsibilities: Yes  Patient Condition: This patient's condition remains as documented in the Consult dated 04/23/12 @1657 , in which the Rehabilitation Physician determined and documented that the patient's condition is appropriate for intensive rehabilitative care in an  inpatient rehabilitation facility.  Preadmission Screen Completed By:  Oletta Darter, 04/25/2012 9:57 AM ______________________________________________________________________   Discussed status with Dr. Wynn Banker on 04/25/12 at 0957 and received telephone approval for admission today.  Admission Coordinator:  Oletta Darter, time 1000 /Date5/2/13

## 2012-04-25 NOTE — Progress Notes (Signed)
Speech Language Pathology Dysphagia Treatment Patient Details Name: [name] MRN: [id number] DOB: December 23, 1926 Today's Date: 04/25/2012 Time: 0865-7846 SLP Time Calculation (min): 20 min  Assessment / Plan / Recommendation Clinical Impression  Treatment focused on pharyngeal strengthening and use of compensatory strategies. Patient able to complete oral care prior to po trials with min assist for thoroughness. Educated patient on rationale for oral care through out the day but especially prior to ice chip trials in decreasing risk of aspiration PNA. Patient able to self feed ice chips with mod verbal cueing to slow rate of intake and for efforttful swallow to strengthen pharyngeal musculature. Patient continues to present with s/s of a severe pharyngeal based dysphagia characterized by decreasd hyo-laryngeal elevation and excursion, wet vocal quality, throat clearing, and coughing post swallow indicative of aspiration. SLP will continue to f/u at bedside. Likely will require intense dysphagia treatment on CIR prior to repeat MBS to determine potential to resume a po diet.     Diet Recommendation  Continue with Current Diet: NPO    SLP Plan Continue with current plan of care   Pertinent Vitals/Pain n/a   Swallowing Goals  SLP Swallowing Goals Goal #3: Patient will complete effortful swallow x 15-20 with min clinician cues. Swallow Study Goal #3 - Progress: Progressing toward goal Goal #4: Patient will demonstrate improved ability to consume ice chips without overt s/s of aspiration to determine readiness for repeat objective swallow evaluation.  Swallow Study Goal #4 - Progress: Progressing toward goal  General Temperature Spikes Noted: No Respiratory Status: Supplemental O2 delivered via (comment) (2 L nasal cannula) Behavior/Cognition: Alert;Cooperative;Pleasant mood Oral Cavity - Dentition: Dentures, top;Missing dentition Patient Positioning: Upright in bed      Dysphagia  Treatment Treatment focused on: Facilitation of pharyngeal phase;Utilization of compensatory strategies Treatment Methods/Modalities: Effortful swallow Patient observed directly with PO's: Yes Type of PO's observed: Ice chips Feeding: Able to feed self Liquids provided via: Teaspoon Pharyngeal Phase Signs & Symptoms: Multiple swallows;Immediate throat clear;Delayed cough (wet vocal quality) Type of cueing: Verbal Amount of cueing: Moderate   Lindsay Longsworth MA, CCC-SLP [phone]   [name] [name] 04/25/2012, 1:08 PM

## 2012-04-25 NOTE — Plan of Care (Addendum)
Overall Plan of Care [facility]) Patient Details Name: [name] MRN: [id number] DOB: 05/21/1926  Diagnosis: Rehabilitation for stroke  Primary Diagnosis:    Left vertebral artery infarct Co-morbidities: Diabetes mellitus  .  Hypertension  .  Hyperlipidemia  .  Obesity  .  Hypothyroidism  .  Vulvar cancer, carcinoma  11/03/2011  .  Stroke  05/2000  right brain CVA pre H&P 2001  .  Atrial fibrillation    Functional Problem List  Patient demonstrates impairments in the following areas: Balance, Bladder, Bowel, Edema, Endurance, Medication Management, Motor, Nutrition, Pain, Perception, Safety, Sensory  and Skin Integrity  Basic ADL's: bathing, dressing and toileting Advanced ADL's: simple meal preparation and light housekeeping  Transfers:  bed to chair, toilet, tub/shower, car and furniture Locomotion:  ambulation and stairs  Additional Impairments:  Swallowing  Anticipated Outcomes Item Anticipated Outcome  Eating/Swallowing  supervision  Basic self-care  Mod I  Tolieting  Mod I  Bowel/Bladder  Continent Bowel and Bladder with toileting min assist  Transfers  Mod I, supervision with tub bench; supervision to bed, w/c, min assist to car  Locomotion  Gait in home x 50'  With supervision; controlled environment x 150' with supervision;  w/c x 75' in controlled environment (for strengthening)  Communication    Cognition    Pain  5 or less  Safety/Judgment  supervision  Other     Therapy Plan:  PT frequency: 1-2x day, 60-90 min , 5 out of 7 days PT Frequency: 1-2 X/day, 60-90 minutes OT Frequency: 1-2 X/day, 60-90 minutes;5 out of 7 days SLP Frequency: 1-2 X/day, 60-90 minutes;5 out of 7 days  Team Interventions: Item RN PT OT SLP SW TR Other  Self Care/Advanced ADL Retraining   x      Neuromuscular Re-Education  x       Therapeutic Activities  x x x  x   UE/LE Strength Training/ROM  x x      UE/LE Coordination Activities  x         Visual/Perceptual Remediation/Compensation  x       DME/Adaptive Equipment Instruction  x x   x   Therapeutic Exercise  x x x  x   Balance/Vestibular Training  x x   x   Patient/Family Education x x x x  x   Cognitive Remediation/Compensation         Functional Mobility Training  x x   x   Ambulation/Gait Training         Stair Training  x       Wheelchair Propulsion/Positioning  x    x   Functional Tourist information centre manager Reintegration   x   x   Dysphagia/Aspiration Precaution Training x   x     Speech/Language Facilitation         Bladder Management x        Bowel Management x        Disease Management/Prevention x        Pain Management x        Medication Management x        Skin Care/Wound Management x        Splinting/Orthotics         Discharge Planning x x x  x x   Psychosocial Support  x x  x x  Team Discharge Planning: Destination:  Home Projected Follow-up:  PT, OT and SLP Home Health Projected Equipment Needs:  Wheelchair, possibly for community access Patient/family involved in discharge planning:  Yes  MD ELOS: 14 days Medical Rehab Prognosis:  Good Assessment: 78 year old female with admission for left brain stem and cerebellar stroke. She now requires CIR level PT, OT, SLP, 24 7 rehabilitation RN and M.D.

## 2012-04-25 NOTE — Progress Notes (Signed)
Inpatient Diabetes Program Recommendations  AACE/ADA: New Consensus Statement on Inpatient Glycemic Control (2009)  Target Ranges:  Prepandial:   less than 140 mg/dL      Peak postprandial:   less than 180 mg/dL (1-2 hours)      Critically ill patients:  140 - 180 mg/dL   Reason for Visit: Results for [name], [name] (MRN [id number]) as of 04/25/2012 13:47  Ref. Range 04/24/2012 17:32 04/24/2012 21:00 04/24/2012 23:58 04/25/2012 03:54 04/25/2012 07:47 04/25/2012 11:51 04/25/2012 11:56  Glucose-Capillary Latest Range: 70-99 mg/dL 409 (H) 811 (H) 914 (H) 212 (H) 196 (H)  235 (H)   CBG's greater than goal.  Inpatient Diabetes Program Recommendations Insulin - Basal: Consider restarting Levemir 20 units daily.  Note: Will follow.

## 2012-04-25 NOTE — Progress Notes (Signed)
Nutrition Follow-up  Diet Order:  NPO Pt with panda placed 5/1, tip beyond the level of the ligament of Treitz.  Jevity 1.2 @ 45 ml/hr with 30 ml Prostat daily providing 1396 kcals, 75 grams, 874 ml H2O and 183 grams CHO.   Meds: Scheduled Meds:   . antiseptic oral rinse  15 mL Mouth Rinse q12n4p  . aspirin  81 mg Per Tube Daily  . atorvastatin  10 mg Per Tube q1800  . chlorhexidine  15 mL Mouth Rinse BID  . diltiazem  60 mg Oral Q6H  . enoxaparin  30 mg Subcutaneous Q24H  . feeding supplement  30 mL Per Tube Daily  . insulin aspart  0-20 Units Subcutaneous Q4H  . irbesartan  150 mg Oral Daily  . levothyroxine  100 mcg Per Tube Q0600  . metoprolol  5 mg Intravenous Q6H  . DISCONTD: diltiazem  30 mg Oral Q8H  . DISCONTD: diltiazem  60 mg Oral Q6H  . DISCONTD: enoxaparin  40 mg Subcutaneous Q24H  . DISCONTD: simvastatin  20 mg Per Tube q1800   Continuous Infusions:   . feeding supplement (JEVITY 1.2 CAL) 1,000 mL (04/24/12 1208)   PRN Meds:.acetaminophen, acetaminophen, alum & mag hydroxide-simeth, HYDROcodone-acetaminophen, ondansetron (ZOFRAN) IV, ondansetron  Labs:  CMP     Component Value Date/Time   NA 140 04/23/2012 0404   K 3.6 04/23/2012 0404   CL 102 04/23/2012 0404   CO2 28 04/23/2012 0404   GLUCOSE 104* 04/23/2012 0404   BUN 24* 04/23/2012 0404   CREATININE 1.55* 04/23/2012 0404   CALCIUM 9.2 04/23/2012 0404   PROT 7.1 04/21/2012 1734   ALBUMIN 3.7 04/21/2012 1734   AST 20 04/21/2012 1734   ALT 18 04/21/2012 1734   ALKPHOS 76 04/21/2012 1734   BILITOT 0.8 04/21/2012 1734   GFRNONAA 30* 04/23/2012 0404   GFRAA 35* 04/23/2012 0404   CBG (last 3)   Basename 04/25/12 0747 04/25/12 0354 04/24/12 2358  GLUCAP 196* 212* 172*    Intake/Output Summary (Last 24 hours) at 04/25/12 1145 Last data filed at 04/25/12 0000  Gross per 24 hour  Intake    400 ml  Output    200 ml  Net    200 ml    Weight Status:  No new wt  Re-estimated needs:  Kcal: 1375-1600; Protein:  65-75; Fluid: >1.5 L/day  Nutrition Dx:  Swallowing difficulty r/t stroke AEB NPO status  Goal:  Goal: Provide >/= 90% of pt's estimated needs; met  Intervention:    160 ml H2O every 6 hours  Total free water provided: 1514 ml  Monitor:  TF tolerance, weight, labs, swallow function   Kendell Bane Cornelison Pager #:  [phone]

## 2012-04-25 NOTE — Discharge Summary (Signed)
Patient ID: [name] MRN: [id number] DOB/AGE: Feb 27, 1926 78 y.o. Primary Care Physician:KUMAR,AJAY, MD, MD Admit date: 04/21/2012 Discharge date: 04/25/2012    Discharge Diagnoses:  Embolic cerebellar and medulla oblongata strokes  Vulvar cancer, carcinoma  Atrial fibrillation with RVR  Diabetes mellitus  Hypertension  Hyperlipidemia  Obesity (BMI 30-39.9)  Hypothyroidism  H/O: CVA (cardiovascular accident)  Acute respiratory failure with hypoxia  Leukocytosis       . antiseptic oral rinse  15 mL Mouth Rinse q12n4p  . aspirin  81 mg Per Tube Daily  . atorvastatin  10 mg Per Tube q1800  . chlorhexidine  15 mL Mouth Rinse BID  . diltiazem  60 mg Oral Q6H  . enoxaparin  30 mg Subcutaneous Q24H  . feeding supplement  30 mL Per Tube Daily  . insulin aspart  0-20 Units Subcutaneous Q4H  . irbesartan  150 mg Oral Daily  . levothyroxine  100 mcg Per Tube Q0600  . metoprolol  5 mg Intravenous Q6H  . DISCONTD: diltiazem  30 mg Oral Q8H  . DISCONTD: diltiazem  60 mg Oral Q6H  . DISCONTD: enoxaparin  40 mg Subcutaneous Q24H  . DISCONTD: simvastatin  20 mg Per Tube q1800    Discharged Condition:transfer to cir    Consults:Neuro  Significant Diagnostic Studies: Dg Abd 1 View  04/24/2012  *RADIOLOGY REPORT*  Clinical Data: Evaluate feeding tube placement  ABDOMEN - 1 VIEW  Comparison: 04/23/2012  Findings: Feeding tube is identified with tip beyond the level of the ligament of Treitz.  10 ml of Omnipaque-300 was injected through the feeding tube.  This confirms proximal jejunal location of the feeding tube tip.  IMPRESSION:  1.  Successful fluoroscopic guided of the feeding tube with tip beyond the level of the ligament of Treitz  Original Report Authenticated By: Rosealee Albee, M.D.   Ct Head Wo Contrast  04/21/2012  *RADIOLOGY REPORT*  Clinical Data: Fall, weakness, trauma to the back of head.  CT HEAD WITHOUT CONTRAST  Technique:  Contiguous axial images were obtained from  the base of the skull through the vertex without contrast.  Comparison: 03/25/2004  Findings: Prominence of the sulci, cisterns, and ventricles, in keeping with volume loss. There are subcortical and periventricular white matter hypodensities, a nonspecific finding most often seen with chronic microangiopathic changes.  There is no evidence for acute hemorrhage, overt hydrocephalus, mass lesion, or abnormal extra-axial fluid collection.  No definite CT evidence for acute cortical based (large artery) infarction. Remote lacunar infarction centered with the right basal ganglia. Hypoattenuation within the inferior left cerebellum may reflect volume averaging.  No associated mass effect.  Partially opacified left sphenoid chamber.  The visualized paranasal sinuses and mastoid air cells are otherwise predominately clear.  No displaced calvarial fracture.  IMPRESSION: Volume loss and white matter changes.  No definite acute intracranial abnormality.  Hypoattenuation within the inferior left cerebellum, may reflect volume averaging. If there is clinical concern for acute ischemia, consider MRI follow-up.  Partially opacified left sphenoid chamber.  Correlate clinically if concern for sinusitis.  Original Report Authenticated By: Waneta Martins, M.D.   Mri Brain Without Contrast  04/22/2012  *RADIOLOGY REPORT*  Clinical Data:  Fall.  Weakness.  Abnormal head CT.  MRI HEAD WITHOUT CONTRAST MRA HEAD WITHOUT CONTRAST  Technique: Multiplanar, multiecho pulse sequences of the brain and surrounding structures were obtained according to standard protocol without intravenous contrast.  Angiographic images of the head were obtained using MRA technique without contrast.  Comparison: 04/21/2012 head CT.  MRI HEAD  Findings:  Moderate sized acute non hemorrhagic infarct inferior left cerebellum and posterior lateral aspect of the left medulla.  Small focal area of blood breakdown products right anterior lower pons may be related  to the prior hemorrhagic ischemia.  Small cavernoma not excluded.  Result of trauma felt to be less likely consideration otherwise no intracranial hemorrhage noted.  Remote tiny l right thalamic infarct and right lenticular nucleus infarct.  Mild to moderate small vessel disease type changes.  Global atrophy without hydrocephalus.  No intracranial mass lesion detected on this unenhanced exam.  Abnormal left vertebral artery.  Please see below.  Mild paranasal sinus mucosal thickening.  IMPRESSION: Moderate size acute non hemorrhagic infarct inferior left cerebellum and posterior lateral aspect of the left medulla.  MRA HEAD  Findings: Anterior circulation without medium or large size vessel significant stenosis or occlusion.  Mild branch vessel irregularity.  Nonvisualization of the left vertebral artery and left PICA. Dissection not excluded.  Mild irregularity without high-grade stenosis of the right vertebral artery or basilar artery.  Nonvisualization right PICA.  Small left AICA.  Bulge of the proximal left lateral basilar artery may represent the insertion site of the occluded left vertebral artery.  Posterior cerebral artery moderate tandem stenoses more notable on the right.  No aneurysm or vascular malformation noted.  IMPRESSION: Nonvisualization left vertebral artery and both PICAs.  Please see above.  Original Report Authenticated By: Fuller Canada, M.D.   Dg Chest Port 1 View  04/23/2012  *RADIOLOGY REPORT*  Clinical Data: Infiltrates.  PORTABLE CHEST - 1 VIEW  Comparison: 04/21/2012  Findings: Cardiomegaly.  Mild vascular congestion.  Improving aeration in the left base.  Minimal residual atelectasis.  Right lung is clear.  No effusions.  IMPRESSION: Improving aeration in the left base.  Minimal residual left base atelectasis.  Cardiomegaly, vascular congestion.  Original Report Authenticated By: Cyndie Chime, M.D.   Dg Chest Portable 1 View  04/21/2012  *RADIOLOGY REPORT*  Clinical Data:  Chest pain and shortness of breath.  Atrial fibrillation.  PORTABLE CHEST - 1 VIEW  Comparison: 02/23/2012.  Findings: Interval enlargement of the cardiac silhouette.  Clear lungs with normal vascularity.  Stable minimal prominence of the interstitial markings.  Diffuse osteopenia.  IMPRESSION:  1.  Interval cardiomegaly. 2.  Stable minimal chronic interstitial lung disease.  Original Report Authenticated By: Darrol Angel, M.D.   Dg Abd Portable 1v  04/23/2012  *RADIOLOGY REPORT*  Clinical Data: 78 year old female with feeding tube placement.  PORTABLE ABDOMEN - 1 VIEW  Comparison: Portable chest radiograph from 0610 hours the same day.  Findings: Portable AP view at 1745 hours.  No definite feeding tube identified in the upper abdomen. Nonobstructed bowel gas pattern. No acute osseous abnormality identified.  Calcified atherosclerosis.  IMPRESSION: EKG wires but no definite feeding tube identified in the upper abdomen.  Recommend follow-up portable chest radiograph.  Original Report Authenticated By: Harley Hallmark, M.D.   Dg Naso G Tube Plc W/fl-no Rad  04/24/2012  CLINICAL DATA: panda tube needed for severe dysphagia   NASO G TUBE PLACEMENT WITH FLUORO  Fluoroscopy was utilized by the requesting physician.  No radiographic  interpretation.     Dg Swallowing Func-no Report  04/22/2012  CLINICAL DATA: dysphagia   FLUOROSCOPY FOR SWALLOWING FUNCTION STUDY:  Fluoroscopy was provided for swallowing function study, which was  administered by a speech pathologist.  Final results and recommendations  from this study  are contained within the speech pathology report.     Mr Maxine Glenn Head/brain Wo Cm  04/22/2012  *RADIOLOGY REPORT*  Clinical Data:  Fall.  Weakness.  Abnormal head CT.  MRI HEAD WITHOUT CONTRAST MRA HEAD WITHOUT CONTRAST  Technique: Multiplanar, multiecho pulse sequences of the brain and surrounding structures were obtained according to standard protocol without intravenous contrast.  Angiographic  images of the head were obtained using MRA technique without contrast.  Comparison: 04/21/2012 head CT.  MRI HEAD  Findings:  Moderate sized acute non hemorrhagic infarct inferior left cerebellum and posterior lateral aspect of the left medulla.  Small focal area of blood breakdown products right anterior lower pons may be related to the prior hemorrhagic ischemia.  Small cavernoma not excluded.  Result of trauma felt to be less likely consideration otherwise no intracranial hemorrhage noted.  Remote tiny l right thalamic infarct and right lenticular nucleus infarct.  Mild to moderate small vessel disease type changes.  Global atrophy without hydrocephalus.  No intracranial mass lesion detected on this unenhanced exam.  Abnormal left vertebral artery.  Please see below.  Mild paranasal sinus mucosal thickening.  IMPRESSION: Moderate size acute non hemorrhagic infarct inferior left cerebellum and posterior lateral aspect of the left medulla.  MRA HEAD  Findings: Anterior circulation without medium or large size vessel significant stenosis or occlusion.  Mild branch vessel irregularity.  Nonvisualization of the left vertebral artery and left PICA. Dissection not excluded.  Mild irregularity without high-grade stenosis of the right vertebral artery or basilar artery.  Nonvisualization right PICA.  Small left AICA.  Bulge of the proximal left lateral basilar artery may represent the insertion site of the occluded left vertebral artery.  Posterior cerebral artery moderate tandem stenoses more notable on the right.  No aneurysm or vascular malformation noted.  IMPRESSION: Nonvisualization left vertebral artery and both PICAs.  Please see above.  Original Report Authenticated By: Fuller Canada, M.D.    Lab Results: Results for orders placed during the hospital encounter of 04/21/12 (from the past 48 hour(s))  GLUCOSE, CAPILLARY     Status: Abnormal   Collection Time   04/23/12 12:39 PM      Component Value Range  Comment   Glucose-Capillary 135 (*) 70 - 99 (mg/dL)    Comment 1 Notify RN      Comment 2 Documented in Chart     GLUCOSE, CAPILLARY     Status: Normal   Collection Time   04/23/12  4:59 PM      Component Value Range Comment   Glucose-Capillary 76  70 - 99 (mg/dL)    Comment 1 Notify RN      Comment 2 Documented in Chart     GLUCOSE, CAPILLARY     Status: Abnormal   Collection Time   04/23/12  8:42 PM      Component Value Range Comment   Glucose-Capillary 122 (*) 70 - 99 (mg/dL)    Comment 1 Documented in Chart      Comment 2 Notify RN     GLUCOSE, CAPILLARY     Status: Normal   Collection Time   04/24/12 12:16 AM      Component Value Range Comment   Glucose-Capillary 95  70 - 99 (mg/dL)    Comment 1 Documented in Chart      Comment 2 Notify RN     GLUCOSE, CAPILLARY     Status: Abnormal   Collection Time   04/24/12  4:22 AM  Component Value Range Comment   Glucose-Capillary 106 (*) 70 - 99 (mg/dL)    Comment 1 Documented in Chart      Comment 2 Notify RN     URINALYSIS, ROUTINE W REFLEX MICROSCOPIC     Status: Abnormal   Collection Time   04/24/12  8:35 AM      Component Value Range Comment   Color, Urine YELLOW  YELLOW     APPearance CLOUDY (*) CLEAR     Specific Gravity, Urine 1.017  1.005 - 1.030     pH 6.0  5.0 - 8.0     Glucose, UA NEGATIVE  NEGATIVE (mg/dL)    Hgb urine dipstick TRACE (*) NEGATIVE     Bilirubin Urine NEGATIVE  NEGATIVE     Ketones, ur 40 (*) NEGATIVE (mg/dL)    Protein, ur 30 (*) NEGATIVE (mg/dL)    Urobilinogen, UA 1.0  0.0 - 1.0 (mg/dL)    Nitrite POSITIVE (*) NEGATIVE     Leukocytes, UA MODERATE (*) NEGATIVE    URINE MICROSCOPIC-ADD ON     Status: Abnormal   Collection Time   04/24/12  8:35 AM      Component Value Range Comment   Squamous Epithelial / LPF RARE  RARE     WBC, UA 11-20  <3 (WBC/hpf)    RBC / HPF 0-2  <3 (RBC/hpf)    Bacteria, UA MANY (*) RARE     Casts HYALINE CASTS (*) NEGATIVE    GLUCOSE, CAPILLARY     Status: Normal    Collection Time   04/24/12  9:23 AM      Component Value Range Comment   Glucose-Capillary 98  70 - 99 (mg/dL)   GLUCOSE, CAPILLARY     Status: Abnormal   Collection Time   04/24/12 12:03 PM      Component Value Range Comment   Glucose-Capillary 103 (*) 70 - 99 (mg/dL)   GLUCOSE, CAPILLARY     Status: Abnormal   Collection Time   04/24/12  5:32 PM      Component Value Range Comment   Glucose-Capillary 201 (*) 70 - 99 (mg/dL)   GLUCOSE, CAPILLARY     Status: Abnormal   Collection Time   04/24/12  9:00 PM      Component Value Range Comment   Glucose-Capillary 131 (*) 70 - 99 (mg/dL)   GLUCOSE, CAPILLARY     Status: Abnormal   Collection Time   04/24/12 11:58 PM      Component Value Range Comment   Glucose-Capillary 172 (*) 70 - 99 (mg/dL)   GLUCOSE, CAPILLARY     Status: Abnormal   Collection Time   04/25/12  3:54 AM      Component Value Range Comment   Glucose-Capillary 212 (*) 70 - 99 (mg/dL)   GLUCOSE, CAPILLARY     Status: Abnormal   Collection Time   04/25/12  7:47 AM      Component Value Range Comment   Glucose-Capillary 196 (*) 70 - 99 (mg/dL)    Recent Results (from the past 240 hour(s))  MRSA PCR SCREENING     Status: Normal   Collection Time   04/21/12 10:37 PM      Component Value Range Status Comment   MRSA by PCR NEGATIVE  NEGATIVE  Final      Hospital Course:  CVA (cerebral infarction)- multifocal- secondary to a-fib . Patient remains with severe dysphagia requiring an artificial feeding tube. Etiology of strokes is felt to be  embolic from atrial fibrillation. Until final decision is made on the need for PEG tube neurology did not recommend anticoagulation. Once oral route is established definitively she may need pradaxa or coumadin   Atrial fibrillation with RVR - intermittent  Rate controlled On cardizem around the clock   Vulvar cancer, carcinoma  S/p multiple surgeries. She will f/u with her oncologist as oupt     Discharge Exam: Blood pressure 128/98, pulse  98, temperature 97.4 F (36.3 C), temperature source Oral, resp. rate 18, height 5\' 3"  (1.6 m), weight 52.6 kg (115 lb 15.4 oz), SpO2 95.00%. Alert and oriented x3   Disposition: CIR  Discharge Orders    Future Appointments: Provider: Department: Dept Phone: Center:   05/06/2012 12:30 PM Jeannette Corpus, MD [facility] [phone] None        Signed: Linley Moskal 04/25/2012, 10:14 AM

## 2012-04-26 DIAGNOSIS — I69991 Dysphagia following unspecified cerebrovascular disease: Secondary | ICD-10-CM

## 2012-04-26 DIAGNOSIS — Z5189 Encounter for other specified aftercare: Secondary | ICD-10-CM

## 2012-04-26 DIAGNOSIS — I69993 Ataxia following unspecified cerebrovascular disease: Secondary | ICD-10-CM

## 2012-04-26 LAB — CBC
HCT: 40.8 % (ref 36.0–46.0)
Hemoglobin: 14.1 g/dL (ref 12.0–15.0)
RBC: 4.44 MIL/uL (ref 3.87–5.11)
WBC: 9.1 10*3/uL (ref 4.0–10.5)

## 2012-04-26 LAB — GLUCOSE, CAPILLARY
Glucose-Capillary: 255 mg/dL — ABNORMAL HIGH (ref 70–99)
Glucose-Capillary: 221 mg/dL — ABNORMAL HIGH (ref 70–99)
Glucose-Capillary: 236 mg/dL — ABNORMAL HIGH (ref 70–99)
Glucose-Capillary: 172 mg/dL — ABNORMAL HIGH (ref 70–99)

## 2012-04-26 LAB — URINE CULTURE
Colony Count: >=100000
Culture  Setup Time: 201305010919

## 2012-04-26 LAB — COMPREHENSIVE METABOLIC PANEL
ALT: 12 U/L (ref 0–35)
Alkaline Phosphatase: 71 U/L (ref 39–117)
BUN: 40 mg/dL — ABNORMAL HIGH (ref 6–23)
CO2: 27 mEq/L (ref 19–32)
Chloride: 102 mEq/L (ref 96–112)
GFR calc Af Amer: 45 mL/min — ABNORMAL LOW (ref >90)
Glucose, Bld: 250 mg/dL — ABNORMAL HIGH (ref 70–99)
Potassium: 3.4 mEq/L — ABNORMAL LOW (ref 3.5–5.1)
Sodium: 139 mEq/L (ref 135–145)
Total Bilirubin: 0.4 mg/dL (ref 0.3–1.2)

## 2012-04-26 LAB — DIFFERENTIAL
Basophils Relative: 0 % (ref 0–1)
Eosinophils Absolute: 0.2 10*3/uL (ref 0.0–0.7)
Eosinophils Relative: 2 % (ref 0–5)
Neutrophils Relative %: 67 % (ref 43–77)

## 2012-04-26 LAB — URINALYSIS, ROUTINE W REFLEX MICROSCOPIC
Glucose, UA: NEGATIVE mg/dL
Hgb urine dipstick: NEGATIVE
Protein, ur: 30 mg/dL — AB
Specific Gravity, Urine: 1.018 (ref 1.005–1.030)
Urobilinogen, UA: 1 mg/dL (ref 0.0–1.0)

## 2012-04-26 LAB — URINE MICROSCOPIC-ADD ON

## 2012-04-26 MED ORDER — WHITE PETROLATUM GEL
Status: AC
  Administered 2012-04-26: 13:00:00
  Filled 2012-04-26: qty 5

## 2012-04-26 MED ORDER — AMITRIPTYLINE HCL 25 MG PO TABS
25.0000 mg | ORAL_TABLET | Freq: Two times a day (BID) | ORAL | Status: DC
  Administered 2012-04-26 – 2012-05-02 (×13): 25 mg
  Filled 2012-04-26 (×17): qty 1

## 2012-04-26 MED ORDER — PHENOL 1.4 % MT LIQD
1.0000 | OROMUCOSAL | Status: DC | PRN
  Administered 2012-04-26: 1 via OROMUCOSAL
  Filled 2012-04-26: qty 177

## 2012-04-26 MED ORDER — SALINE SPRAY 0.65 % NA SOLN
1.0000 | NASAL | Status: DC | PRN
  Administered 2012-05-02: 1 via NASAL
  Filled 2012-04-26: qty 44

## 2012-04-26 MED ORDER — POTASSIUM CHLORIDE 20 MEQ/15ML (10%) PO LIQD
10.0000 meq | Freq: Two times a day (BID) | ORAL | Status: DC
  Administered 2012-04-26 (×2): 10 meq
  Administered 2012-04-27: 10:00:00
  Administered 2012-04-27 – 2012-05-10 (×25): 10 meq
  Filled 2012-04-26 (×32): qty 7.5

## 2012-04-26 MED ORDER — DILTIAZEM 12 MG/ML ORAL SUSPENSION
60.0000 mg | Freq: Four times a day (QID) | ORAL | Status: DC
  Administered 2012-04-26 – 2012-05-10 (×55): 60 mg via ORAL
  Filled 2012-04-26 (×60): qty 6

## 2012-04-26 MED ORDER — POTASSIUM CHLORIDE CRYS ER 10 MEQ PO TBCR
10.0000 meq | EXTENDED_RELEASE_TABLET | Freq: Two times a day (BID) | ORAL | Status: DC
Start: 2012-04-26 — End: 2012-04-26
  Filled 2012-04-26 (×3): qty 1

## 2012-04-26 MED ORDER — CIPROFLOXACIN HCL 250 MG PO TABS
250.0000 mg | ORAL_TABLET | Freq: Two times a day (BID) | ORAL | Status: DC
  Administered 2012-04-26 – 2012-04-30 (×10): 250 mg via ORAL
  Filled 2012-04-26 (×13): qty 1

## 2012-04-26 MED ORDER — AMITRIPTYLINE HCL 25 MG PO TABS
25.0000 mg | ORAL_TABLET | Freq: Two times a day (BID) | ORAL | Status: DC
  Filled 2012-04-26 (×2): qty 1

## 2012-04-26 NOTE — Progress Notes (Signed)
Patient ID: [name], female   DOB: October 23, 1926, 78 y.o.   MRN: [id number] HPI: 78 year old right-handed female with history of hypertension diabetes mellitus as well as right brain stroke 2001. Patient with recent radical vulvectomy 8 weeks ago for recurrent vulvar cancer. She is currently not undergoing chemotherapy or radiation therapy. Admitted 04/21/2012 after episode of nausea vomiting as well as a fall when she attempted to get out of bed. Her husband called 911 she was brought to the emergency room. In the emergency room she was found to have a true fibrillation with RVR. MRI of the brain showed moderate-sized acute nonhemorrhagic infarction in inferior left cerebellum and posterior lateral aspect of the left medulla  Subjective/Complaints:   Objective: Vital Signs: Blood pressure 128/71, pulse 91, temperature 97.8 F (36.6 C), temperature source Oral, resp. rate 18, weight 53.3 kg (117 lb 8.1 oz), SpO2 96.00%. Dg Abd 1 View  04/24/2012  *RADIOLOGY REPORT*  Clinical Data: Evaluate feeding tube placement  ABDOMEN - 1 VIEW  Comparison: 04/23/2012  Findings: Feeding tube is identified with tip beyond the level of the ligament of Treitz.  10 ml of Omnipaque-300 was injected through the feeding tube.  This confirms proximal jejunal location of the feeding tube tip.  IMPRESSION:  1.  Successful fluoroscopic guided of the feeding tube with tip beyond the level of the ligament of Treitz  Original Report Authenticated By: Rosealee Albee, M.D.   Dg Naso G Tube Plc W/fl-no Rad  04/24/2012  CLINICAL DATA: panda tube needed for severe dysphagia   NASO G TUBE PLACEMENT WITH FLUORO  Fluoroscopy was utilized by the requesting physician.  No radiographic  interpretation.     Results for orders placed during the hospital encounter of 04/25/12 (from the past 72 hour(s))  GLUCOSE, CAPILLARY     Status: Abnormal   Collection Time   04/25/12  8:24 PM      Component Value Range Comment   Glucose-Capillary 154  (*) 70 - 99 (mg/dL)    Comment 1 Notify RN     MRSA PCR SCREENING     Status: Normal   Collection Time   04/25/12  9:03 PM      Component Value Range Comment   MRSA by PCR NEGATIVE  NEGATIVE    GLUCOSE, CAPILLARY     Status: Abnormal   Collection Time   04/26/12 12:04 AM      Component Value Range Comment   Glucose-Capillary 233 (*) 70 - 99 (mg/dL)    Comment 1 Notify RN     GLUCOSE, CAPILLARY     Status: Abnormal   Collection Time   04/26/12  3:56 AM      Component Value Range Comment   Glucose-Capillary 255 (*) 70 - 99 (mg/dL)    Comment 1 Notify RN     CBC     Status: Normal   Collection Time   04/26/12  5:48 AM      Component Value Range Comment   WBC 9.1  4.0 - 10.5 (K/uL)    RBC 4.44  3.87 - 5.11 (MIL/uL)    Hemoglobin 14.1  12.0 - 15.0 (g/dL)    HCT 40.9  81.1 - 91.4 (%)    MCV 91.9  78.0 - 100.0 (fL)    MCH 31.8  26.0 - 34.0 (pg)    MCHC 34.6  30.0 - 36.0 (g/dL)    RDW 78.2  95.6 - 21.3 (%)    Platelets 183  150 - 400 (  K/uL)   COMPREHENSIVE METABOLIC PANEL     Status: Abnormal   Collection Time   04/26/12  5:48 AM      Component Value Range Comment   Sodium 139  135 - 145 (mEq/L)    Potassium 3.4 (*) 3.5 - 5.1 (mEq/L)    Chloride 102  96 - 112 (mEq/L)    CO2 27  19 - 32 (mEq/L)    Glucose, Bld 250 (*) 70 - 99 (mg/dL)    BUN 40 (*) 6 - 23 (mg/dL)    Creatinine, Ser 1.61 (*) 0.50 - 1.10 (mg/dL)    Calcium 9.4  8.4 - 10.5 (mg/dL)    Total Protein 6.7  6.0 - 8.3 (g/dL)    Albumin 3.2 (*) 3.5 - 5.2 (g/dL)    AST 14  0 - 37 (U/L)    ALT 12  0 - 35 (U/L)    Alkaline Phosphatase 71  39 - 117 (U/L)    Total Bilirubin 0.4  0.3 - 1.2 (mg/dL)    GFR calc non Af Amer 39 (*) >90 (mL/min)    GFR calc Af Amer 45 (*) >90 (mL/min)   DIFFERENTIAL     Status: Normal   Collection Time   04/26/12  5:48 AM      Component Value Range Comment   Neutrophils Relative 67  43 - 77 (%)    Neutro Abs 6.1  1.7 - 7.7 (K/uL)    Lymphocytes Relative 22  12 - 46 (%)    Lymphs Abs 2.0  0.7 - 4.0  (K/uL)    Monocytes Relative 9  3 - 12 (%)    Monocytes Absolute 0.8  0.1 - 1.0 (K/uL)    Eosinophils Relative 2  0 - 5 (%)    Eosinophils Absolute 0.2  0.0 - 0.7 (K/uL)    Basophils Relative 0  0 - 1 (%)    Basophils Absolute 0.0  0.0 - 0.1 (K/uL)      HEENT: NG tube Cardio: irregular Resp: CTA B/L GI: BS positive and Distention Extremity:  Pulses positive and No Edema Skin:   Intact Neuro: Alert/Oriented, Abnormal FMC Ataxic/ dec FMC, Tone:  Within Normal Limits and Dysarthric Musc/Skel:  Normal   Assessment/Plan: 1. Functional deficits secondary to L Vertebral artery infarct which require 3+ hours per day of interdisciplinary therapy in a comprehensive inpatient rehab setting. Physiatrist is providing close team supervision and 24 hour management of active medical problems listed below. Physiatrist and rehab team continue to assess barriers to discharge/monitor patient progress toward functional and medical goals. FIM:       FIM - Toileting Toileting: 1: Two helpers           Comprehension Comprehension Mode: Auditory Comprehension: 5-Understands complex 90% of the time/Cues < 10% of the time  Expression Expression Mode: Verbal Expression: 5-Expresses complex 90% of the time/cues < 10% of the time  Social Interaction Social Interaction: 4-Interacts appropriately 75 - 89% of the time - Needs redirection for appropriate language or to initiate interaction.  Problem Solving Problem Solving: 5-Solves basic 90% of the time/requires cueing < 10% of the time  Memory Memory: 5-Recognizes or recalls 90% of the time/requires cueing < 10% of the time  2. Anticoagulation/DVT prophylaxis with Pharmaceutical: Lovenox  Medical Problem List and Plan:  1. left posterior circulation infarcts with left hemiataxia. Current plan is to continue aspirin therapy until by mouth or permanent feeding access and then consider Pradaxa versus adding Plavix  2.  DVT  Prophylaxis/Anticoagulation: Subcutaneous Lovenox. Monitor platelet counts any signs of bleeding  3. Dysphagia. Nasogastric tube feeds. Patient currently n.p.o. Speech therapy followup  4. Atrial fibrillation. Cardiac rate control. Continue Cardizem 60 mg every 6 hours.  5. Hypertension. Avapro 150 mg daily. Monitor when out of bed  6. Diabetes mellitus. Hemoglobin A1c 7.5. Sliding scale insulin. Continue check blood sugars as directed. Patient on Levemir 16-32 units twice a day and NovoLog 14-32 units 3 times a day prior to admission. Will resume scheduled insulin therapy as tolerated  7. Hypothyroidism. Synthroid  8. History of vulvar cancer. Status post radical vulvectomy 8 weeks ago. Currently no chemotherapy or radiation therapy. Followup oncology services as directed .  Foley cath D/C no with incontinence 9. Hyperlipidemia. Lipitor   LOS (Days) 1 A FACE TO FACE EVALUATION WAS PERFORMED  Emily Massar E 04/26/2012, 7:07 AM

## 2012-04-26 NOTE — Evaluation (Signed)
Occupational Therapy Assessment and Plan and Skilled Therapy Intervention  Patient Details  Name: [name] MRN: [id number] Date of Birth: 1926/12/06  OT Diagnosis: muscle weakness (generalized) Rehab Potential: Rehab Potential: Good ELOS: 10-12 days   Today's Date: 04/26/2012 Time: 0800-0900 Time Calculation (min): 60 min  Skilled Therapy Intervention:  Pt seen for initial evaluation and ADL retraining to include bathing and dressing from EOB.  Pt tolerated sitting on EOB for 30 minutes and then needed to transfer to chair.  Tolerated sitting for 15 minutes and then became very lethargic and needed to go back to bed.  O2 99%, HR 68.  Pt was able to perform the majority of her self care but needs min assist with mobility due to poor activity tolerance and decreased balance.  Pt is highly motivated to return home at a supervision to mod I level.  Problem List:  Patient Active Problem List  Diagnoses  . Vulvar cancer, carcinoma  . Atrial fibrillation with RVR  . Diabetes mellitus  . Hypertension  . Hyperlipidemia  . Obesity (BMI 30-39.9)  . Hypothyroidism  . H/O: CVA (cardiovascular accident)  . Acute respiratory failure with hypoxia  . Leukocytosis  . CVA (cerebral infarction)    Past Medical History:  Past Medical History  Diagnosis Date  . Diabetes mellitus   . Hypertension   . Hyperlipidemia   . Obesity   . Hypothyroidism   . Vulvar cancer, carcinoma 11/03/2011  . Stroke 05/2000    right brain CVA pre H&P 2001  . Atrial fibrillation    Past Surgical History:  Past Surgical History  Procedure Date  . Vulvar lesion removal 2012  . Breast surgery     Lumpectomy  . Dilation and curettage of uterus   . Cystourethroplasty / ureteroneocystostomy   . Radical vulvectomy   . Lymphadenectomy 2007    R inguinal femoral  . Radical vulvectomy 02/27/12  . Vulvar lesion removal 02/27/2012    Procedure: VULVAR LESION;  Surgeon: Jeannette Corpus, MD;  Location: [facility];   Service: Gynecology;  Laterality: N/A;    Assessment & Plan Clinical Impression:  78 year old right-handed female with history of hypertension diabetes mellitus as well as right brain stroke 2001. Patient with recent radical vulvectomy 8 weeks ago for recurrent vulvar cancer. She is currently not undergoing chemotherapy or radiation therapy. Admitted 04/21/2012 after episode of nausea vomiting as well as a fall when she attempted to get out of bed. Her husband called 911 she was brought to the emergency room. In the emergency room she was found to have a true fibrillation with RVR. MRI of the brain showed moderate-sized acute nonhemorrhagic infarction in inferior left cerebellum and posterior lateral aspect of the left medulla. Echocardiogram with ejection fraction 55-60% without embolus and mild aortic stenosis. Carotid Dopplers with no ICA stenosis. Neurology consulted presently on aspirin and subcutaneous Lovenox however they are considering pradaxa. Cardiology ( DR.Skains) follow for atrial fibrillation and present plans to continue aspirin therapy until able to take by mouth or permanent tube access and then consider Pradaxa versus Plavix. Patient is presently n.p.o. after a failed modified barium swallow 04/22/2012 and nasogastric tube placed for nutritional support. Patient transferred to CIR on 04/25/2012 .    Patient currently requires min with basic self-care skills secondary to muscle weakness and poor activity tolerance..  Prior to hospitalization, patient could complete basic ADLS and simple IADLs with independently.  Patient will benefit from skilled intervention to increase independence with basic self-care  skills and increase level of independence with iADL prior to discharge home with care partner.  Anticipate patient will require intermittent supervision and follow up home health.  OT - End of Session Activity Tolerance: Tolerates 10 - 20 min activity with multiple rests Endurance  Deficit: Yes Endurance Deficit Description: pt worked for 45 minutes, then became extremely fatigued and had to rest in bed OT Assessment Rehab Potential: Good Barriers to Discharge: None OT Plan OT Frequency: 1-2 X/day, 60-90 minutes;5 out of 7 days Estimated Length of Stay: 10-12 days OT Treatment/Interventions: Balance/vestibular training;Discharge planning;DME/adaptive equipment instruction;Functional mobility training;Patient/family education;Self Care/advanced ADL retraining;Therapeutic Activities;Therapeutic Exercise;UE/LE Strength taining/ROM OT Recommendation Follow Up Recommendations: Home health OT  OT Evaluation Precautions/Restrictions  Precautions Precautions: Fall Precaution Comments: NPO, Panda tube Restrictions Weight Bearing Restrictions: No General Chart Reviewed: Yes Family/Caregiver Present: No Vital Signs Therapy Vitals Pulse Rate: 68  BP: 128/71 mmHg Patient Position, if appropriate: Sitting Oxygen Therapy SpO2: 99 % O2 Device: None (Room air) Pulse Oximetry Type: Intermittent Pain Pain Assessment Pain Assessment: No/denies pain Home Living/Prior Functioning Home Living Lives With: Spouse Available Help at Discharge: Family;Available 24 hours/day;Other (Comment) (spouse can provide supervision only) Type of Home: House Home Access: Stairs to enter [employer] of Steps: 2 Entrance Stairs-Rails: None Home Layout: Multi-level (split level) Alternate Level Stairs-Number of Steps: 2 Alternate Level Stairs-Rails: Right;Left Bathroom Shower/Tub: Curtain;Tub/shower unit Firefighter: Standard Bathroom Accessibility: Yes How Accessible: Accessible via walker Home Adaptive Equipment: Bedside commode/3-in-1;Tub transfer bench;Walker - rolling;Straight cane Prior Function Level of Independence: Independent with basic ADLs;Independent with homemaking with ambulation;Independent with gait;Other (comment) (used straight cane when out in the  community) Able to Take Stairs?: Yes Driving: No Vocation: Retired ADL ADL Eating: NPO Grooming: Armed forces technical officer Bathing: Setup Lower Body Bathing: Contact guard Upper Body Dressing: Setup Lower Body Dressing: Minimal assistance Toileting: Not assessed Where Assessed-Toileting: Bedside Commode Toilet Transfer: Minimal assistance Toilet Transfer Method: Stand pivot Tub/Shower Transfer: Not assessed Psychologist, counselling Transfer: Not assessed ADL Comments: Pt fatigues extremely easily. Vision/Perception  Vision - History Baseline Vision: No visual deficits Patient Visual Report: Other (comment) (pt reports seeing "red spots" when focusing on something) Vision - Assessment Eye Alignment: Within Functional Limits Perception Perception: Within Functional Limits Praxis Praxis: Intact  Cognition Overall Cognitive Status: Appears within functional limits for tasks assessed Arousal/Alertness: Awake/alert (became lethargic after 45 min of activity) Orientation Level: Oriented X4 Safety/Judgment: Appears intact Sensation Sensation Light Touch: Appears Intact Stereognosis: Appears Intact Hot/Cold: Appears Intact Proprioception: Appears Intact Coordination Gross Motor Movements are Fluid and Coordinated: Yes Fine Motor Movements are Fluid and Coordinated: Yes Motor  Motor Motor - Skilled Clinical Observations: Generalized muscle weakness Mobility  Bed Mobility Supine to Sit: 4: Min assist Sit to Supine: 4: Min assist Transfers Sit to Stand: 4: Min assist Stand to Sit: 4: Min assist  Trunk/Postural Assessment  Cervical Assessment Cervical Assessment: Within Functional Limits Thoracic Assessment Thoracic Assessment: Within Functional Limits Lumbar Assessment Lumbar Assessment: Within Functional Limits Postural Control Postural Control: Within Functional Limits  Balance Static Sitting Balance Static Sitting - Balance Support: No upper extremity supported;Feet  supported Static Sitting - Level of Assistance: 5: Stand by assistance Static Standing Balance Static Standing - Balance Support: No upper extremity supported;During functional activity Static Standing - Level of Assistance: 4: Min assist Extremity/Trunk Assessment RUE Assessment RUE Assessment: Within Functional Limits LUE Assessment LUE Assessment: Within Functional Limits  See FIM for current functional status Refer to Care Plan for Long Term Goals  Recommendations for other services: None  Discharge Criteria: Patient will be discharged from OT if patient refuses treatment 3 consecutive times without medical reason, if treatment goals not met, if there is a change in medical status, if patient makes no progress towards goals or if patient is discharged from hospital.  The above assessment, treatment plan, treatment alternatives and goals were discussed and mutually agreed upon: by patient  [facility] 04/26/2012, 9:26 AM

## 2012-04-26 NOTE — Evaluation (Signed)
Physical Therapy Assessment and Plan  Patient Details  Name: [name] MRN: [id number] Date of Birth: Apr 30, 1926  PT Diagnosis: Abnormal posture, Abnormality of gait, Difficulty walking, Dizziness and giddiness, Hemiparesis non-dominant and Muscle weakness Rehab Potential: Good ELOS: 14 days   Today's Date: 04/26/2012 Time: 1100-1155 Time Calculation (min): 55 min  Problem List:  Patient Active Problem List  Diagnoses  . Vulvar cancer, carcinoma  . Atrial fibrillation with RVR  . Diabetes mellitus  . Hypertension  . Hyperlipidemia  . Obesity (BMI 30-39.9)  . Hypothyroidism  . H/O: CVA (cardiovascular accident)  . Acute respiratory failure with hypoxia  . Leukocytosis  . CVA (cerebral infarction)    Past Medical History:  Past Medical History  Diagnosis Date  . Diabetes mellitus   . Hypertension   . Hyperlipidemia   . Obesity   . Hypothyroidism   . Vulvar cancer, carcinoma 11/03/2011  . Stroke 05/2000    right brain CVA pre H&P 2001  . Atrial fibrillation    Past Surgical History:  Past Surgical History  Procedure Date  . Vulvar lesion removal 2012  . Breast surgery     Lumpectomy  . Dilation and curettage of uterus   . Cystourethroplasty / ureteroneocystostomy   . Radical vulvectomy   . Lymphadenectomy 2007    R inguinal femoral  . Radical vulvectomy 02/27/12  . Vulvar lesion removal 02/27/2012    Procedure: VULVAR LESION;  Surgeon: Jeannette Corpus, MD;  Location: [facility];  Service: Gynecology;  Laterality: N/A;    Assessment & Plan Clinical Impression: Patient is an 78 year old right-handed female with history of hypertension diabetes mellitus as well as right brain stroke 2001. Patient with recent radical vulvectomy 8 weeks ago for recurrent vulvar cancer. She is currently not undergoing chemotherapy or radiation therapy. Admitted 04/21/2012 after episode of nausea vomiting as well as a fall when she attempted to get out of bed. Her husband called 911  she was brought to the emergency room. In the emergency room she was found to have a true fibrillation with RVR. MRI of the brain showed moderate-sized acute nonhemorrhagic infarction in inferior left cerebellum and posterior lateral aspect of the left medulla. Echocardiogram with ejection fraction 55-60% without embolus and mild aortic stenosis. Carotid Dopplers with no ICA stenosis. Neurology consulted presently on aspirin and subcutaneous Lovenox however they are considering pradaxa. Cardiology ( DR.Skains) follow for atrial fibrillation and present plans to continue aspirin therapy until able to take by mouth or permanent tube access and then consider Pradaxa versus Plavix. Patient is presently n.p.o. after a failed modified barium swallow 04/22/2012 and nasogastric tube placed for nutritional support.   Patient transferred to CIR on 04/25/2012 .   Patient currently requires mod assist with mobility secondary to muscle weakness, decreased cardiorespiratoy endurance, impaired timing and sequencing and unbalanced muscle activation and decreased midline orientation, decreased attention to left and decreased motor planning.  Prior to hospitalization, patient was recovering from vulvular CA surgery 8 weeks previously, and was spending most of the day out of bed.  She was modified independent, using a SPC occasionally for mobility and lived with Spouse in a House home.  Home access is 2Stairs to enter, no rail.  Her husband works 3x/week.  PTA, a neighbor helped out after she returned home from the CA surgery.    Patient will benefit from skilled PT intervention to maximize safe functional mobility, minimize fall risk and decrease caregiver burden for planned discharge home with 24 hour  supervision, min assist to enter and exit the home.    Anticipate patient will benefit from follow up HH at discharge.  PT - End of Session Activity Tolerance: Tolerates < 10 min activity with changes in vital signs Endurance  Deficit: Yes Endurance Deficit Description: pt required seated rest breaks every 5 minutes PT Assessment Rehab Potential: Good Barriers to Discharge: Decreased caregiver support PT Plan PT Frequency: 1-2 X/day, 60-90 minutes Estimated Length of Stay: 14 days PT Treatment/Interventions: Ambulation/gait training;Balance/vestibular training;Discharge planning;DME/adaptive equipment instruction;Functional mobility training;Patient/family education;Splinting/orthotics;Neuromuscular re-education;Stair training;Psychosocial support;Therapeutic Activities;Therapeutic Exercise;UE/LE Coordination activities;UE/LE Strength taining/ROM;Wheelchair propulsion/positioning;Visual/perceptual remediation/compensation PT Recommendation Follow Up Recommendations: Home health PT Equipment Recommended: Wheelchair (measurements);Wheelchair cushion (measurements) (TBD) Equipment Details: owns a SPC and RW  PT Evaluation Precautions/Restrictions Precautions Precautions: Fall Precaution Comments: NPO Restrictions Weight Bearing Restrictions: No  Vital Signs Therapy Vitals BP: 120/60 mmHg; HR 125 after transfer Patient Position, if appropriate: Sitting Oxygen Therapy SpO2: 97 % O2 Device: None (Room air) Pulse Oximetry Type: Intermittent Pt dyspneic and c/o fatigue after being OOB x 15 minutes.   Pain Pain Assessment Pain Assessment: No/denies pain Pain Score: 0-No pain Home Living/Prior Functioning Home Living Lives With: Spouse Available Help at Discharge: Family Type of Home: House Home Access: Stairs to enter Secretary/administrator of Steps: 2 Entrance Stairs-Rails: None Home Layout: Multi-level Alternate Level Stairs-Number of Steps: 2 Alternate Level Stairs-Rails: Right;Left Bathroom Shower/Tub: Curtain;Tub/shower unit Firefighter: Standard Bathroom Accessibility: Yes How Accessible: Accessible via walker Home Adaptive Equipment: Walker - rolling;Straight cane Prior Function Level  of Independence: Independent with gait (occasionally used SPC) Able to Take Stairs?: Yes Driving: No Vocation: Retired Optometrist - History Baseline Vision: No visual deficits Patient Visual Report: Other (comment) (pt reports seeing "red spots" when focusing on something) Vision - Assessment Eye Alignment: Within Functional Limits Perception Perception:  (midline disorientation; leans L in standing) Praxis Praxis: Intact  Cognition Overall Cognitive Status: Appears within functional limits for tasks assessed Arousal/Alertness: Awake/alert Orientation Level: Oriented X4 Attention: Selective Memory:  (report using aids PTA but was functional) Safety/Judgment: Appears intact Sensation Sensation Light Touch: Appears Intact Stereognosis: Appears Intact Hot/Cold: Appears Intact Proprioception: Appears Intact Coordination Gross Motor Movements are Fluid and Coordinated: Yes Fine Motor Movements are Fluid and Coordinated: Yes Heel Shin Test: normal speed and coordination bil Motor  Motor Motor: Abnormal postural alignment and control Motor - Skilled Clinical Observations: sits with increased wt bearing L; stands listing to L; generalized mild weakness  Mobility Bed Mobility Bed Mobility: Rolling Right;Rolling Left;Left Sidelying to Sit Rolling Right: 4: Min assist Rolling Left: 4: Min assist Left Sidelying to Sit: 4: Min assist Left Sidelying to Sit Details (indicate cue type and reason): pt tends to move bil feet off bed, then attempt sitting up without rolling Supine to Sit: 4: Min assist Sit to Supine: 4: Min guard Transfers Sit to Stand: 4: Min assist Sit to Stand Details: Tactile cues for placement;Verbal cues for technique Stand to Sit: 4: Min assist Stand Pivot Transfers: 3: Mod assist Stand Pivot Transfer Details: Tactile cues for placement;Verbal cues for technique Locomotion  Ambulation Ambulation/Gait Assistance: 1: +2 Total assist    Trunk/Postural Assessment  Cervical Assessment Cervical Assessment: Within Functional Limits Thoracic Assessment Thoracic Assessment: Within Functional Limits Lumbar Assessment Lumbar Assessment: Within Functional Limits Postural Control Postural Control: Deficits on evaluation  Balance Static Sitting Balance Static Sitting - Balance Support: No upper extremity supported;Feet supported Static Sitting - Level of Assistance: 5: Stand by assistance Static Standing Balance  Static Standing - Balance Support: No upper extremity supported;During functional activity Static Standing - Level of Assistance: 4: Min assist Extremity Assessment    LUE Assessment LUE Assessment: Within Functional Limits RLE Assessment RLE Assessment: Within Functional Limits LLE Assessment LLE Assessment: Exceptions to [facility] (tight hamstrings and heel cord) LLE Strength LLE Overall Strength Comments: grossly 4/5  See FIM for current functional status Refer to Care Plan for Long Term Goals  Recommendations for other services: None  Discharge Criteria: Patient will be discharged from PT if patient refuses treatment 3 consecutive times without medical reason, if treatment goals not met, if there is a change in medical status, if patient makes no progress towards goals or if patient is discharged from hospital.  The above assessment, treatment plan, treatment alternatives and goals were discussed and mutually agreed upon: by patient.  Treatment today:  AM- sitting balance on toilet with feet unsupported, bil UEs supported with rails, with supervision.  Static standing balance with mod assist while wt shifting L<>R.  Pt was reluctant to full shift to R, and required manual facilitation.  She stated that she was aware that she was drifting L, but was unable to correct it.  PM- Bed> w/c transfer to R with min assist, max VCs for technique, safety, foot placement.  Seated herapeutic exercise performed with LE to  increase strength for functional mobility.  Gait for stair management , x 3 4" steps, using 2 rails, with mod assist; min VCs.  Gait training with RW, x 50' with min assist for majority, mod assist briefly due to 1 LOB L.   Gait focus on upright stance, forward gaze, controlled wt shifting L<>R.      Dijon Cosens 04/26/2012, 2:42 PM

## 2012-04-26 NOTE — Progress Notes (Signed)
Patient continuing to produce moderate to copious amounts of thick white/yellow sputum. Patient frequently suctions herself orally with covered Yankauer. Patient stated, "I feel like I'm choking." Head of bed elevated, no residual to NG tube, NG tube positioned correctly. Dr. Wynn Banker notified, received telephone order to begin 25 mg dose of amitriptyline per tube bid. Patient educated on new medication. First dose to be given at 2000 this evening. Will continue to monitor. [name]

## 2012-04-26 NOTE — Progress Notes (Signed)
Social Work Assessment and Plan Social Work Assessment and Plan  Patient Details  Name: [name] MRN: [id number] Date of Birth: February 13, 1926  Today's Date: 04/26/2012  Problem List:  Patient Active Problem List  Diagnoses  . Vulvar cancer, carcinoma  . Atrial fibrillation with RVR  . Diabetes mellitus  . Hypertension  . Hyperlipidemia  . Obesity (BMI 30-39.9)  . Hypothyroidism  . H/O: CVA (cardiovascular accident)  . Acute respiratory failure with hypoxia  . Leukocytosis  . CVA (cerebral infarction)   Past Medical History:  Past Medical History  Diagnosis Date  . Diabetes mellitus   . Hypertension   . Hyperlipidemia   . Obesity   . Hypothyroidism   . Vulvar cancer, carcinoma 11/03/2011  . Stroke 05/2000    right brain CVA pre H&P 2001  . Atrial fibrillation    Past Surgical History:  Past Surgical History  Procedure Date  . Vulvar lesion removal 2012  . Breast surgery     Lumpectomy  . Dilation and curettage of uterus   . Cystourethroplasty / ureteroneocystostomy   . Radical vulvectomy   . Lymphadenectomy 2007    R inguinal femoral  . Radical vulvectomy 02/27/12  . Vulvar lesion removal 02/27/2012    Procedure: VULVAR LESION;  Surgeon: Jeannette Corpus, MD;  Location: [facility];  Service: Gynecology;  Laterality: N/A;   Social History:  reports that she quit smoking about 44 years ago. Her smoking use included Cigarettes. She has a 4.5 pack-year smoking history. She does not have any smokeless tobacco history on file. She reports that she does not drink alcohol or use illicit drugs.  Family / Support Systems Marital Status: Married Patient Roles: Spouse;Parent Spouse/Significant Other: Russell-husband  [phone]  [phone]  [phone] Th, F, Sat Children: Son in [place] Other Supports: Neighbor who sits with her, did after her surgery Anticipated Caregiver: Husband but may need neighbor to stay with her while husband works. Ability/Limitations  of Caregiver: Husband walks with a cane so can not provide much physical care Caregiver Availability: 24/7 Family Dynamics: Pt has been through a lot in a short period of time.  She is exhausted from therapies, due to being so deconditioned.  Husband has been very supportive throughout this ordeal  Social History Preferred language: English Religion: Non-Denominational Cultural Background: No issues Education: High School Read: Yes Write: Yes Employment Status: Retired Fish farm manager Issues: No issues Guardian/Conservator: None   Abuse/Neglect Physical Abuse: Denies Verbal Abuse: Denies Sexual Abuse: Denies Exploitation of patient/patient's resources: Denies Self-Neglect: Denies  Emotional Status Pt's affect, behavior adn adjustment status: Pt wants to do well and get stronger.  She wants her swallow to get better so she can get the NG tube out of her nose.  She feels this will be a slow process. Recent Psychosocial Issues: Recent surgery and being bed-bound for eight weeks.  Now this and deficits from this. Pyschiatric History: No history-Will do Depression Screen when pt more awake and able to answer more coherently Substance Abuse History: No issues  Patient / Family Perceptions, Expectations & Goals Pt/Family understanding of illness & functional limitations: Pt can explain her admission and deficits.  She is more thankful she can move her arms and legs, but worried about her swallowing.  Husband coming later will obtain info from him. Premorbid pt/family roles/activities: Wife, Mother, Retiree, Home owner, Church member, etc Anticipated changes in roles/activities/participation: Resume once discharged Pt/family expectations/goals: Pt states: " I want to get stronger and hope my  swallow improves."  Pt is falling alseep as we talk.  Community Resources [employer]: None Premorbid Home Care/DME Agencies: None Transportation available at discharge:  Husband Resource referrals recommended: Support group (specify) (CVA Support Group)  Discharge Planning Living Arrangements: Spouse/significant other Support Systems: Spouse/significant other;Children;Church/faith community Type of Residence: Private residence Insurance Resources: Administrator (specify) Education officer, museum) Surveyor, quantity Resources: Tree surgeon;Family Support Financial Screen Referred: No Living Expenses: Lives with family Money Management: Patient;Spouse Do you have any problems obtaining your medications?: No Home Management: Both Patient/Family Preliminary Plans: Return home with husband providing assistance or neighbor assisting with her care.  Both husband and neighbor can only provide minimal assist Social Work Anticipated Follow Up Needs: HH/OP;Support Group  Clinical Impression Exhausted female laying in the bed falling asleep as we talk.  She is hopeful and will try to do what the team asks of her.  Severely deconditioned from recent surgery and being in bed for eight weeks. Talk with husband to confirm plan  Bodhi Stenglein, Lemar Livings 04/26/2012, 1:47 PM

## 2012-04-26 NOTE — Progress Notes (Addendum)
INITIAL ADULT NUTRITION ASSESSMENT Date: 04/26/2012   Time: 9:45 AM  Reason for Assessment: Rehab admission/new TF  ASSESSMENT: Female 78 y.o.  Dx: left posterior circulation infarcts with left hemiataxia  Hx:  Past Medical History  Diagnosis Date  . Diabetes mellitus   . Hypertension   . Hyperlipidemia   . Obesity   . Hypothyroidism   . Vulvar cancer, carcinoma 11/03/2011  . Stroke 05/2000    right brain CVA pre H&P 2001  . Atrial fibrillation     Related Meds:     . antiseptic oral rinse  15 mL Mouth Rinse q12n4p  . aspirin  81 mg Per Tube Daily  . atorvastatin  10 mg Per Tube q1800  . chlorhexidine  15 mL Mouth Rinse BID  . ciprofloxacin  250 mg Oral BID  . diltiazem  60 mg Oral Q6H  . enoxaparin  30 mg Subcutaneous Q24H  . feeding supplement (JEVITY 1.2 CAL)  1,000 mL Per Tube Q24H  . feeding supplement  30 mL Per Tube Daily  . insulin aspart  0-20 Units Subcutaneous Q4H  . irbesartan  150 mg Oral Daily  . levothyroxine  100 mcg Per Tube Q0600  . potassium chloride  10 mEq Per Tube BID  . DISCONTD: antiseptic oral rinse  15 mL Mouth Rinse q12n4p  . DISCONTD: diltiazem  60 mg Oral Q6H  . DISCONTD: potassium chloride  10 mEq Oral BID    Ht: 5'3 (160 cm)  Wt: 117 lb 8.1 oz (53.3 kg) -- question accuracy of weight, patient does not physically appear 117 lb  Ideal Wt: 52.2 kg % Ideal Wt: ---  Usual Wt: 151 lb -- per initial nutrition assessment 5/1 (acute stay) % Usual Wt: ---  BMI = 26.7 kg/m2 -- based on UBW  Food/Nutrition Related Hx: problems chewing or swallowing foods and/or liquids  Labs:  CMP     Component Value Date/Time   NA 139 04/26/2012 0548   K 3.4* 04/26/2012 0548   CL 102 04/26/2012 0548   CO2 27 04/26/2012 0548   GLUCOSE 250* 04/26/2012 0548   BUN 40* 04/26/2012 0548   CREATININE 1.25* 04/26/2012 0548   CALCIUM 9.4 04/26/2012 0548   PROT 6.7 04/26/2012 0548   ALBUMIN 3.2* 04/26/2012 0548   AST 14 04/26/2012 0548   ALT 12 04/26/2012 0548   ALKPHOS 71  04/26/2012 0548   BILITOT 0.4 04/26/2012 0548   GFRNONAA 39* 04/26/2012 0548   GFRAA 45* 04/26/2012 0548     Intake/Output Summary (Last 24 hours) at 04/26/12 0954 Last data filed at 04/26/12 0809  Gross per 24 hour  Intake      0 ml  Output    453 ml  Net   -453 ml    CBG (last 3)   Basename 04/26/12 0737 04/26/12 0356 04/26/12 0004  GLUCAP 221* 255* 233*    Diet Order: NPO  Supplements/Tube Feeding: Jevity 1.2 at 45 ml/hr with Prostat liquid protein daily via Panda tube  IVF: N/A  Estimated Nutritional Needs:   Kcal: 1400-1600 Protein: 65-75 gm Fluid: > 1.5 L  Patient followed per RD during acute hospital stay; s/p MBSS 4/29; patient presented with severe oropharyngeal dysphagia; SLP recommending NPO status; Panda tube placed 5/1 (tip beyond the level of ligament of Treitz); admitted to IP Rehab 5/2 PM; current EN regimen of Jevity 1.2 at 45 ml/hr with Prostat liquid protein daily providing 1396 total kcals, 75 gm protein, 1515 ml of free water  NUTRITION  DIAGNOSIS: -Inadequate oral intake (NI-2.1).  Status: Ongoing  RELATED TO: severe dysphagia  AS EVIDENCE BY: NPO status  MONITORING/EVALUATION(Goals): Goal: EN to meet >90% of estimated nutrition needs -- met Monitor: EN tolerance, PO diet advancement, weight, labs, I/O's  EDUCATION NEEDS: -No education needs identified at this time  INTERVENTION:  Please re-weight patient  Continue current EN regimen  RD to follow for nutrition care plan, add interventions accordingly  Dietitian #: [phone]  DOCUMENTATION CODES Per approved criteria  -Not Applicable    Alger Memos 04/26/2012, 9:45 AM

## 2012-04-26 NOTE — Progress Notes (Signed)
Per State Regulation 482.30 This chart was reviewed for medical necessity with respect to the patient's Admission/Duration of stay. Lane-Morgan, Calea Hribar Anne                 Nurse Care Manager            Next Review Date: 04/29/12   

## 2012-04-26 NOTE — Progress Notes (Signed)
[facility] Individual Statement of Services  Patient Name:  [name]  Date:  04/26/2012  Welcome to the [facility].  Our goal is to provide you with an individualized program based on your diagnosis and situation, designed to meet your specific needs.  With this comprehensive rehabilitation program, you will be expected to participate in at least 3 hours of rehabilitation therapies Monday-Friday, with modified therapy programming on the weekends.  Your rehabilitation program will include the following services:  Physical Therapy (PT), Occupational Therapy (OT), 24 hour per day rehabilitation nursing, Therapeutic Recreaction (TR), Case Management (RN and Child psychotherapist), Rehabilitation Medicine, Nutrition Services and Pharmacy Services  Weekly team conferences will be held on Wednesdays to discuss your progress.  Your RN Case Designer, television/film set will talk with you frequently to get your input and to update you on team discussions.  Team conferences with you and your family in attendance may also be held.  Expected length of stay: 12-14 days   Overall anticipated outcome: supervision - modified independent   Depending on your progress and recovery, your program may change.  Your RN Case Estate agent will coordinate services and will keep you informed of any changes.  Your RN Sports coach and SW names and contact numbers are listed  below.  The following services may also be recommended but are not provided by the [facility]:   Driving Evaluations  Home Health Rehabiltiation Services  Outpatient Rehabilitatation [facility]  Vocational Rehabilitation   Arrangements will be made to provide these services after discharge if needed.  Arrangements include referral to agencies that provide these services.  Your insurance has been verified to be:  Medicare + [facility] Your primary doctor is:  Dr Tempie Hoist  Pertinent  information will be shared with your doctor and your insurance company.  Case Manager: Lutricia Horsfall, [facility] [phone]  Social Worker:  Dossie Der, [place] [phone]  Information discussed with and copy given to patient by: [name] Dare, 04/26/2012

## 2012-04-26 NOTE — H&P (Signed)
Physical Medicine and Rehabilitation Admission H&P   CC: Weakness:  HPI: 78 year old right-handed female with history of hypertension diabetes mellitus as well as right brain stroke 2001. Patient with recent radical vulvectomy 8 weeks ago for recurrent vulvar cancer. She is currently not undergoing chemotherapy or radiation therapy. Admitted 04/21/2012 after episode of nausea vomiting as well as a fall when she attempted to get out of bed. Her husband called 911 she was brought to the emergency room. In the emergency room she was found to have a true fibrillation with RVR. MRI of the brain showed moderate-sized acute nonhemorrhagic infarction in inferior left cerebellum and posterior lateral aspect of the left medulla. Echocardiogram with ejection fraction 55-60% without embolus and mild aortic stenosis. Carotid Dopplers with no ICA stenosis. Neurology consulted presently on aspirin and subcutaneous Lovenox however they are considering pradaxa. Cardiology ( DR.Skains) follow for atrial fibrillation and present plans to continue aspirin therapy until able to take by mouth or permanent tube access and then consider Pradaxa versus Plavix. Patient is presently n.p.o. after a failed modified barium swallow 04/22/2012 and nasogastric tube placed for nutritional support. Physical therapy evaluation completed 04/22/2012 with recommendations for physical medicine rehabilitation consult to consider inpatient rehabilitation services.   Review of Systems  Constitutional: Positive for malaise/fatigue.  Cardiovascular: Positive for palpitations.  Genitourinary: Positive for urgency.  Musculoskeletal: Positive for falls.  All other systems reviewed and are negative  Past Medical History  Diagnosis Date  . Diabetes mellitus   . Hypertension   . Hyperlipidemia   . Obesity   . Hypothyroidism   . Vulvar cancer, carcinoma 11/03/2011  . Stroke 05/2000    right brain CVA pre H&P 2001  . Atrial fibrillation     Past Surgical History  Procedure Date  . Vulvar lesion removal 2012  . Breast surgery     Lumpectomy  . Dilation and curettage of uterus   . Cystourethroplasty / ureteroneocystostomy   . Radical vulvectomy   . Lymphadenectomy 2007    R inguinal femoral  . Radical vulvectomy 02/27/12  . Vulvar lesion removal 02/27/2012    Procedure: VULVAR LESION;  Surgeon: Jeannette Corpus, MD;  Location: [facility];  Service: Gynecology;  Laterality: N/A;   Family History  Problem Relation Age of Onset  . Diabetes type II     Social History:  reports that she quit smoking about 44 years ago. Her smoking use included Cigarettes. She has a 4.5 pack-year smoking history. She does not have any smokeless tobacco history on file. She reports that she does not drink alcohol or use illicit drugs. Allergies: No Known Allergies Medications Prior to Admission  Medication Sig Dispense Refill  . alendronate (FOSAMAX) 70 MG tablet Take 70 mg by mouth every 7 (seven) days. On Sunday. Take with a full glass of water on an empty stomach. Patient states she has not taken this medication in the last 2-3 weeks.      [name] aspirin EC 81 MG tablet Take 81 mg by mouth every morning.      [name] atorvastatin (LIPITOR) 20 MG tablet Take 20 mg by mouth at bedtime.       [name] diltiazem (CARDIZEM CD) 240 MG 24 hr capsule Take 240 mg by mouth daily.       [name] doxazosin (CARDURA) 2 MG tablet Take 0.5 mg by mouth daily.       . indapamide (LOZOL) 1.25 MG tablet Take 1.25 mg by mouth every morning.       [name]  insulin aspart (NOVOLOG) 100 UNIT/ML injection Inject 14-32 Units into the skin 3 (three) times daily before meals. 16 units in the am, 14 at lunch, and 32 at bedtime      . insulin detemir (LEVEMIR) 100 UNIT/ML injection Inject 16-32 Units into the skin 2 (two) times daily. 32 units at breakfast, 16 in the evening      . levothyroxine (SYNTHROID, LEVOTHROID) 100 MCG tablet Take 100 mcg by mouth every morning.       . valsartan (DIOVAN) 320  MG tablet Take 320 mg by mouth every morning.       . Vitamin D, Ergocalciferol, (DRISDOL) 50000 UNITS CAPS Take 50,000 Units by mouth every 7 (seven) days. On Mondays.        Home:     Functional History:    Functional Status:  Mobility:          ADL:    Cognition: Cognition Orientation Level: Oriented X4     Blood pressure 138/74. Physical Exam  Vitals reviewed.  HENT: Nasogastric tube in place Head: Normocephalic.  Neck: Neck supple. No thyromegaly present.  Cardiovascular:  Irregular irregular  Pulmonary/Chest:  Decreased breath sounds at the bases without rales  Abdominal: She exhibits no distension. There is no tenderness.  Musculoskeletal: She exhibits no edema.  Neurological: She is alert. No cranial nerve deficit or sensory deficit.  Speech is mildly dysarthric but fully intelligible.Voice is raspy She is oriented x3 and follows commands well. Left limb ataxia upper greater than lower extremity. Cognitively she is alert and appropriate. Strength is grossly 3-4/5 proximal to 4+ out of 5 distally upper and lower extremities.  Skin: Skin is warm and dry.  Psychiatric: She has a normal mood and affect   Results for orders placed during the hospital encounter of 04/25/12 (from the past 48 hour(s))  GLUCOSE, CAPILLARY     Status: Abnormal   Collection Time   04/25/12  8:24 PM      Component Value Range Comment   Glucose-Capillary 154 (*) 70 - 99 (mg/dL)    Comment 1 Notify RN     MRSA PCR SCREENING     Status: Normal   Collection Time   04/25/12  9:03 PM      Component Value Range Comment   MRSA by PCR NEGATIVE  NEGATIVE    GLUCOSE, CAPILLARY     Status: Abnormal   Collection Time   04/26/12 12:04 AM      Component Value Range Comment   Glucose-Capillary 233 (*) 70 - 99 (mg/dL)    Comment 1 Notify RN     GLUCOSE, CAPILLARY     Status: Abnormal   Collection Time   04/26/12  3:56 AM      Component Value Range Comment   Glucose-Capillary 255 (*) 70 - 99  (mg/dL)    Comment 1 Notify RN      Dg Abd 1 View  04/24/2012  *RADIOLOGY REPORT*  Clinical Data: Evaluate feeding tube placement  ABDOMEN - 1 VIEW  Comparison: 04/23/2012  Findings: Feeding tube is identified with tip beyond the level of the ligament of Treitz.  10 ml of Omnipaque-300 was injected through the feeding tube.  This confirms proximal jejunal location of the feeding tube tip.  IMPRESSION:  1.  Successful fluoroscopic guided of the feeding tube with tip beyond the level of the ligament of Treitz  Original Report Authenticated By: Rosealee Albee, M.D.   Dg Naso G Tube Plc W/fl-no Rad  04/24/2012  CLINICAL DATA: panda tube needed for severe dysphagia   NASO G TUBE PLACEMENT WITH FLUORO  Fluoroscopy was utilized by the requesting physician.  No radiographic  interpretation.      Post Admission Physician Evaluation: 1. 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 2. Patient is admitted to receive collaborative, interdisciplinary care between the physiatrist, rehab nursing staff, and therapy team. 3. Patient's level of medical complexity and substantial therapy needs in context of that medical necessity cannot be provided at a lesser intensity of care such as a SNF. 4. Patient has experienced substantial functional loss from his/her baseline which was documented above under the "Functional History" and "Functional Status" headings.  Judging by the patient's diagnosis, physical exam, and functional history, the patient has potential for functional progress which will result in measurable gains while on inpatient rehab.  These gains will be of substantial and practical use upon discharge  in facilitating mobility and self-care at the household level. 5. Physiatrist will provide 24 hour management of medical needs as well as oversight of the therapy plan/treatment and provide guidance as appropriate regarding the interaction of the two. 6. 24  hour rehab nursing will assist with bowel management, safety, skin/wound care, disease management, medication administration and patient education  and help integrate therapy concepts, techniques,education, etc. 7. PT will assess and treat for:  Pre gait, gait, endurance , equipment.  Goals are: Sup mobility. 8. OT will assess and treat for: ADL,Neuromuscular re ed equipment , cog/perceptual, safety.   Goals are: Sup ADLs. 9. SLP will assess and treat for: Dysphagia.  Goals are: Safe po intake. 10. Case Management and Social Worker will assess and treat for psychological issues and discharge planning. 11. Team conference will be held weekly to assess progress toward goals and to determine barriers to discharge. 12.  Patient will receive at least 3 hours of therapy per day at least 5 days per week. 13. ELOS and Prognosis: 10-14 days  excellent   Medical Problem List and Plan: 1. left posterior circulation infarcts with left hemiataxia. Current plan is to continue aspirin therapy until by mouth or permanent feeding access and then consider Pradaxa versus adding Plavix 2. DVT Prophylaxis/Anticoagulation: Subcutaneous Lovenox. Monitor platelet counts any signs of bleeding  3. Dysphagia. Nasogastric tube feeds. Patient currently n.p.o. Speech therapy followup 4. Atrial fibrillation. Cardiac rate control. Continue Cardizem 60 mg every 6 hours. 5. Hypertension. Avapro 150 mg daily. Monitor when out of bed 6. Diabetes mellitus. Hemoglobin A1c 7.5. Sliding scale insulin. Continue check blood sugars as directed. Patient on Levemir 16-32 units twice a day and NovoLog 14-32 units 3 times a day prior to admission. Will resume scheduled insulin therapy as tolerated 7. Hypothyroidism. Synthroid 8. History of vulvar cancer. Status post radical vulvectomy 8 weeks ago. Currently no chemotherapy or radiation therapy. Followup oncology services as directed 9. Hyperlipidemia. Lipitor  [name] E 04/26/2012,  6:08 AM

## 2012-04-26 NOTE — Progress Notes (Signed)
Patient information reviewed and entered into [facility] system by Kaelani Kendrick, RN, CRRN, PPS Coordinator.  Information including medical coding and functional independence measure will be reviewed and updated through discharge.     Per nursing patient was given "Data Collection Information Summary for Patients in Inpatient Rehabilitation Facilities with attached "Privacy Act Statement-Health Care Records" upon admission.   

## 2012-04-26 NOTE — Evaluation (Signed)
Clinical/Bedside Swallow Evaluation & Cognitive-Linguistic Evaluation Patient Details  Name: [name] MRN: [id number] DOB: Jan 27, 1926  Today's Date: 04/26/2012 Time: 0950-1040 Time Calculation (min): 50 min  Past Medical History:  Past Medical History  Diagnosis Date  . Diabetes mellitus   . Hypertension   . Hyperlipidemia   . Obesity   . Hypothyroidism   . Vulvar cancer, carcinoma 11/03/2011  . Stroke 05/2000    right brain CVA pre H&P 2001  . Atrial fibrillation    Past Surgical History:  Past Surgical History  Procedure Date  . Vulvar lesion removal 2012  . Breast surgery     Lumpectomy  . Dilation and curettage of uterus   . Cystourethroplasty / ureteroneocystostomy   . Radical vulvectomy   . Lymphadenectomy 2007    R inguinal femoral  . Radical vulvectomy 02/27/12  . Vulvar lesion removal 02/27/2012    Procedure: VULVAR LESION;  Surgeon: Jeannette Corpus, MD;  Location: [facility];  Service: Gynecology;  Laterality: N/A;   HPI:  78 year old female who approximately 8 weeks ago underwent radical vulvectomy for recurrent vulvar cancer. Currently she's not undergoing chemotherapy or radiation therapy. Over the past 8 weeks patient has mostly been sedentary and bedbound per family mostly due to indwelling Foley. The Foley was removed approximately 5 days ago. She has been up in the chair a few times since. She has not had a physical therapy. Admitted 4/28 with severe nausea and vomiting, no hematemesis, s/p fall on the floor while attempting to utilize commode. In the ER she was found to have A. fib with RVR.  MBSS completed 04/22/12 with significant aspiraiton and pyriform sinus residue; Transferred to CIR 04/25/12 with NPO and pande tube placed.   Assessment/Recommendations/Treatment Plan SLP Assessment Clinical Impression Statement: Patient presented with NPO status and known aspiration; Patient required setup assist and supervision semantic cues to perform oral care with  suctioning.  Following oral care patient demonstrated throat clears and coughs, upon palpation patient demonstrated significantly reduced hyolaryngeal elevation and excursion indicitive of suspeted penetration/aspiration due to a severe pharyngeal based dysphagia.  SLP educated both RN and patient on rationale for diligent oral care to reduce risk of aspiraiton PNA. Risk for Aspiration: Severe  Swallow Evaluation Recommendations Diet Recommendations: NPO;Alternative means - long-term Medication Administration: Via alternative means Oral Care Recommendations: Oral care QID Follow up Recommendations: Home health SLP  Treatment Plan Treatment Plan Recommendations: Therapy as outlined in treatment plan below Speech Therapy Frequency: min 5x/week Treatment Duration: 2 weeks Interventions: Aspiration precaution training;Pharyngeal strengthening exercises;Compensatory techniques;Patient/family education;Trials of upgraded texture/liquids;Diet toleration management by SLP;Group dysphagia treatment;NMES  Prognosis Prognosis for Safe Diet Advancement: Fair Barriers to Reach Goals: Severity of dysphagia Barriers/Prognosis Comment: Recovery maybe long term  Individuals Consulted Consulted and Agree with Results and Recommendations: Patient  Swallowing Goals  SLP Swallowing Goals Goal #3: Patient will demonstrate improved ability to consume ice chips with dereased overt s/s of aspiration to determine readiness for repeat objective swallow evaluation. Goal #4: Patient will complete hard effortful swallows 30 times with minimal assist seamtnic cues Goal #5 Patient will complete oral care with supervision semantic cues  Swallow Study Prior Functional Status  Type of Home: House Lives With: Spouse Available Help at Discharge: Family;Available 24 hours/day;Other (Comment) (spouse can provide supervision only) Vocation: Retired  Radio producer  HPI: 78 year old female who approximately 8 weeks ago  underwent radical vulvectomy for recurrent vulvar cancer. Currently she's not undergoing chemotherapy or radiation therapy. Over the past 8 weeks patient  has mostly been sedentary and bedbound per family mostly due to indwelling Foley. The Foley was removed approximately 5 days ago. She has been up in the chair a few times since. She has not had a physical therapy. Admitted 4/28 with severe nausea and vomiting, no hematemesis, s/p fall on the floor while attempting to utilize commode. In the ER she was found to have A. fib with RVR.  MBSS completed 04/22/12 with significant aspiraiton and pyriform sinus residue; Transferred to CIR 04/25/12 with NPO and pande tube placed. Type of Study: Bedside swallow evaluation Previous Swallow Assessment: MBSS 04/12/12 Diet Prior to this Study: NPO Temperature Spikes Noted: No Respiratory Status: Room air History of Intubation: No Behavior/Cognition: Alert;Cooperative;Pleasant mood Oral Cavity - Dentition: Dentures, top;Missing dentition (bottom dentition missing) Vision: Functional for self-feeding Patient Positioning: Upright in bed Baseline Vocal Quality: Hoarse;Low vocal intensity Volitional Cough: Weak Volitional Swallow: Able to elicit  Oral Motor/Sensory Function  Overall Oral Motor/Sensory Function: Appears within functional limits for tasks assessed  Consistency Results  Ice Chips Ice chips: Impaired Presentation: Spoon;Self Fed Pharyngeal Phase Impairments: Delayed Swallow;Decreased hyoid-laryngeal movement;Wet Vocal Quality;Throat Clearing - Immediate;Throat Clearing - Delayed  Thin Liquid Thin Liquid: Not tested Other Comments: known aspiraiton per MBSS  Nectar Thick Liquid Nectar Thick Liquid: Not tested Other Comments: known aspiration per MBSS  Honey Thick Liquid Honey Thick Liquid: Not tested  Puree Puree: Not tested Other Comments: Known pyrifor nsius reside per MBSS  Solid Solid: Not tested  Skilled Treatment: SLP  facilitated session by placing NMES in the 3a electrode placement at 10-12 mA to target hyolaryngeal excursion with minimal assist semantic cues to perforicem hard effortful swallows with ice chips.  Patient demonstrated 2-3 swallows with 4-5 throat clears followed by additional swallows per ice cube.     Speech Language Pathology Assessment and Plan  Patient Details  Name: [name] MRN: [id number] Date of Birth: 28-Jul-1926  SLP Diagnosis: n/a  Problem List:  Patient Active Problem List  Diagnoses  . Vulvar cancer, carcinoma  . Atrial fibrillation with RVR  . Diabetes mellitus  . Hypertension  . Hyperlipidemia  . Obesity (BMI 30-39.9)  . Hypothyroidism  . H/O: CVA (cardiovascular accident)  . Acute respiratory failure with hypoxia  . Leukocytosis  . CVA (cerebral infarction)    Past Medical History:  Past Medical History  Diagnosis Date  . Diabetes mellitus   . Hypertension   . Hyperlipidemia   . Obesity   . Hypothyroidism   . Vulvar cancer, carcinoma 11/03/2011  . Stroke 05/2000    right brain CVA pre H&P 2001  . Atrial fibrillation    Past Surgical History:  Past Surgical History  Procedure Date  . Vulvar lesion removal 2012  . Breast surgery     Lumpectomy  . Dilation and curettage of uterus   . Cystourethroplasty / ureteroneocystostomy   . Radical vulvectomy   . Lymphadenectomy 2007    R inguinal femoral  . Radical vulvectomy 02/27/12  . Vulvar lesion removal 02/27/2012    Procedure: VULVAR LESION;  Surgeon: Jeannette Corpus, MD;  Location: [facility];  Service: Gynecology;  Laterality: N/A;    Assessment & Plan Clinical Impression: Please see HPI above for information; During assessment patient demonstrated [facility] cognitive-linguistic skills and no skilled SLP services or follow up are recommended in this area at this time.  Skilled therapy will focus on treatment of her dysphagia.   SLP - End of Session Patient left: in bed;with call bell/phone  within  reach;with bed alarm set;with family/visitor present Nurse Communication: Aspiration precautions reviewed;Diet recommendation;Other (comment) (need for oral care QID) Assessment SLP Recommendation/Assessment: Patient does not need any further Speech Lanaguage Pathology Services No Skilled Speech Therapy: Patient at baseline level of functioning (with cognitive-linguistic tasks)  SLP Evaluation Precautions/Restrictions  Precautions Precautions: Fall Precaution Comments: NPO, Panda tube Restrictions Weight Bearing Restrictions: No General    Pain Pain Assessment Pain Assessment: No/denies pain Pain Score: 0-No pain Prior Functioning Cognitive/Linguistic Baseline: Within functional limits Type of Home: House Lives With: Spouse Available Help at Discharge: Family;Available 24 hours/day;Other (Comment) (spouse can provide supervision only) Vocation: Retired IT consultant Overall Cognitive Status: Appears within functional limits for tasks assessed (basic-mod complex) Arousal/Alertness: Awake/alert (became lethargic after 45 min of activity) Orientation Level: Oriented X4 Memory:  (report using aids PTA but was functional) Safety/Judgment: Appears intact Comprehension Auditory Comprehension Overall Auditory Comprehension: Appears within functional limits for tasks assessed Expression Expression Primary Mode of Expression: Verbal Verbal Expression Overall Verbal Expression: Appears within functional limits for tasks assessed Written Expression Dominant Hand: Right Written Expression: Not tested Oral/Motor Oral Motor/Sensory Function Overall Oral Motor/Sensory Function: Appears within functional limits for tasks assessed (appears to be at baseline) Motor Speech Overall Motor Speech: Appears within functional limits for tasks assessed  See FIM for current functional status Refer to Care Plan for Long Term Goals  Recommendations for other services: None  Discharge Criteria:  Patient will be discharged from SLP if patient refuses treatment 3 consecutive times without medical reason, if treatment goals not met, if there is a change in medical status, if patient makes no progress towards goals or if patient is discharged from hospital.  The above assessment, treatment plan, treatment alternatives and goals were discussed and mutually agreed upon: by patient  Fae Pippin, M.A., CCC-SLP [phone]  Janie Strothman 04/26/2012,11:21 AM

## 2012-04-26 NOTE — Progress Notes (Signed)
Inpatient Diabetes Program Recommendations  AACE/ADA: New Consensus Statement on Inpatient Glycemic Control (2009)  Target Ranges:  Prepandial:   less than 140 mg/dL      Peak postprandial:   less than 180 mg/dL (1-2 hours)      Critically ill patients:  140 - 180 mg/dL   Reason for Visit: 12/30/08  CBGs [phone] mg/dl  Inpatient Diabetes Program Recommendations Insulin - Basal: . Start Levemir 20 units daily if CBGs continue greater than 180 mg/dl and on continuous tube feedings.  Note: Patient takes Levemir at home.

## 2012-04-27 LAB — GLUCOSE, CAPILLARY
Glucose-Capillary: 182 mg/dL — ABNORMAL HIGH (ref 70–99)
Glucose-Capillary: 247 mg/dL — ABNORMAL HIGH (ref 70–99)

## 2012-04-27 MED ORDER — INSULIN GLARGINE 100 UNIT/ML ~~LOC~~ SOLN
10.0000 [IU] | Freq: Every day | SUBCUTANEOUS | Status: DC
  Administered 2012-04-27: 10 [IU] via SUBCUTANEOUS

## 2012-04-27 MED ORDER — ALBUTEROL SULFATE (5 MG/ML) 0.5% IN NEBU
INHALATION_SOLUTION | RESPIRATORY_TRACT | Status: AC
  Filled 2012-04-27: qty 0.5

## 2012-04-27 NOTE — Progress Notes (Signed)
Physical Therapy Note  Patient Details  Name: KOURTLYNN TREVOR MRN: [id number] Date of Birth: 1926/05/01 Today's Date: 04/27/2012  9562-1308 (55 minutes) individual Pain: no complaint of pain Focus of treatment: Gait training  Treatment: Pt ambulates 25 feet X 2 with RW mod assist for balance secondary to moderate lean to left with assist to maintain AD on walking surface. Gait X2 HHA  Mod assist 25 feet with standing stops x2 to regain standing balance. Increased lean to left with fatigue.   Kayceon Oki,JIM 04/27/2012, 9:36 AM

## 2012-04-27 NOTE — Progress Notes (Signed)
Speech Language Pathology Daily Session Note  Patient Details  Name: [name] MRN: [id number] Date of Birth: 1926-10-17  Today's Date: 04/27/2012 Time: 1400-1445 Time Calculation (min): 45 min  Short Term Goals: Week 1:    Skilled Therapeutic Interventions: Patient seen in room to address dysphagia goals. Patient lethargic however participated in session with intermittent verbal cues to attend. Speech-Language Pathologist performed oral and denture care/cleaning prior to administration of ice chip trials. Patient exhibited immediate cough response and eyes watering after consuming first ice chip, however tolerated the next 6-8 ice chips with cough/throat clear response noted approximately 50% of the time. Patient was able to cough and move phlegm to oral cavity, then suctioned it out herself. Unable to determine at bedside whether cough response was from the ice chips themselves or from phlegm that may have loosened in pharynx.   Daily Session Precautions/Restrictions    FIM:  Comprehension Comprehension: 5-Follows basic conversation/direction: With extra time/assistive device Expression Expression: 5-Expresses basic needs/ideas: With extra time/assistive device Social Interaction Social Interaction: 5-Interacts appropriately 90% of the time - Needs monitoring or encouragement for participation or interaction. General    Pain Pain Assessment Pain Assessment: No/denies pain Cognition:   Oral/Motor: Oral Motor/Sensory Function Overall Oral Motor/Sensory Function: Appears within functional limits for tasks assessed (appears to be at baseline) Motor Speech Overall Motor Speech: Appears within functional limits for tasks assessed Comprehension: Auditory Comprehension Overall Auditory Comprehension: Appears within functional limits for tasks assessed Expression: Expression Primary Mode of Expression: Verbal Verbal Expression Overall Verbal Expression: Appears within functional  limits for tasks assessed Written Expression Dominant Hand: Right Written Expression: Not tested  Therapy/Group: Individual Therapy  Pablo Lawrence 04/27/2012, 5:34 PM  Angela Nevin, MA, CCC-SLP Speech-Language Pathologist [facility] Inpatient Rehabilitation

## 2012-04-27 NOTE — Progress Notes (Signed)
Occupational Therapy Session Note  Patient Details  Name: [name] MRN: [id number] Date of Birth: 08-13-1926  Today's Date: 04/27/2012  Session #1  Time: 1115-1200 Time Calculation (min): 45 min  Short Term Goals: Week 1:  OT Short Term Goal 1 (Week 1): Pt will transfer to toilet with supervision with or without RW. OT Short Term Goal 1 - Progress (Week 1): Progressing toward goal OT Short Term Goal 2 (Week 1): Pt will dress LB with supervision. OT Short Term Goal 2 - Progress (Week 1): Progressing toward goal OT Short Term Goal 3 (Week 1): Pt will perform simple meal prep with supervision. OT Short Term Goal 3 - Progress (Week 1): Progressing toward goal  Skilled Therapeutic Interventions: ADL-retraining with emphasis on bed mobility, transfers, energy conservation, endurance training.  Patient reported extreme fatigue after having stayed up in her w/c since early morning and initially rejected planned activity at sink side but agreed to morning bathing in bed as best compromise to continue activity at her highest level of functioning.   OT provided setup with wash basin and supplies and patient completed activity with good thoroughness although requiring assist to maintain position in bed due to sliding down.  Patient requires supervision and min assist at times for transfers with reminders to lock brakes and attend to NG tube and lines.   Therapy Documentation Precautions:  Precautions Precautions: Fall Precaution Comments: NPO Restrictions Weight Bearing Restrictions: No General:   Vital Signs: Therapy Vitals Temp: 97.5 F (36.4 C) Temp src: Oral Pulse Rate: 77  Resp: 20  BP: 96/65 mmHg Patient Position, if appropriate: Lying Oxygen Therapy SpO2: 97 % O2 Device: None (Room air)  ADL: ADL Eating: NPO Grooming: Setup Upper Body Bathing: Setup Lower Body Bathing: Contact guard Upper Body Dressing: Setup Lower Body Dressing: Minimal assistance Toileting: Not  assessed Where Assessed-Toileting: Bedside Commode Toilet Transfer: Minimal assistance Toilet Transfer Method: Stand pivot Tub/Shower Transfer: Not assessed Psychologist, counselling Transfer: Not assessed ADL Comments: Pt fatigues extremely easily.  See FIM for current functional status  Therapy/Group: Individual Therapy  Session #2  Time: 1245-1330 Time Calculation (min): 45 min  Skilled Therapeutic Interventions: Therapeutic activity with emphasis on sit to stand, endurance training, and dynamic sitting and standing balance with RW. Patient participated in gym activity, sitting and standing to play tic-tac-toe bean bag toss, and completed 2 games without complaint.  Patient then requested to toilet and voided urine in her bathroom with supervision assist only to position w/c close to grab bars and toilet.  Patient managed cloths and toilet hygiene with standby assist and was then assisted back to bed with steadying assist.  Therapy: Individual Therapy  Georgeanne Nim 04/27/2012, 4:52 PM

## 2012-04-27 NOTE — Progress Notes (Signed)
Patient ID: [name], female   DOB: 1926-09-24, 78 y.o.   MRN: [id number] HPI: 78 year old right-handed female with history of hypertension diabetes mellitus as well as right brain stroke 2001. Patient with recent radical vulvectomy 8 weeks ago for recurrent vulvar cancer. She is currently not undergoing chemotherapy or radiation therapy. Admitted 04/21/2012 after episode of nausea vomiting as well as a fall when she attempted to get out of bed. Her husband called 911 she was brought to the emergency room. In the emergency room she was found to have atrial fibrillation with RVR. MRI of the brain showed moderate-sized acute nonhemorrhagic infarction in inferior left cerebellum and posterior lateral aspect of the left medulla  Subjective/Complaints: No congestion slept well Objective: Vital Signs: Blood pressure 114/68, pulse 89, temperature 98.6 F (37 C), temperature source Oral, resp. rate 17, weight 53.3 kg (117 lb 8.1 oz), SpO2 93.00%. No results found. Results for orders placed during the hospital encounter of 04/25/12 (from the past 72 hour(s))  GLUCOSE, CAPILLARY     Status: Abnormal   Collection Time   04/25/12  8:24 PM      Component Value Range Comment   Glucose-Capillary 154 (*) 70 - 99 (mg/dL)    Comment 1 Notify RN     MRSA PCR SCREENING     Status: Normal   Collection Time   04/25/12  9:03 PM      Component Value Range Comment   MRSA by PCR NEGATIVE  NEGATIVE    GLUCOSE, CAPILLARY     Status: Abnormal   Collection Time   04/26/12 12:04 AM      Component Value Range Comment   Glucose-Capillary 233 (*) 70 - 99 (mg/dL)    Comment 1 Notify RN     GLUCOSE, CAPILLARY     Status: Abnormal   Collection Time   04/26/12  3:56 AM      Component Value Range Comment   Glucose-Capillary 255 (*) 70 - 99 (mg/dL)    Comment 1 Notify RN     CBC     Status: Normal   Collection Time   04/26/12  5:48 AM      Component Value Range Comment   WBC 9.1  4.0 - 10.5 (K/uL)    RBC 4.44  3.87 - 5.11  (MIL/uL)    Hemoglobin 14.1  12.0 - 15.0 (g/dL)    HCT 40.9  81.1 - 91.4 (%)    MCV 91.9  78.0 - 100.0 (fL)    MCH 31.8  26.0 - 34.0 (pg)    MCHC 34.6  30.0 - 36.0 (g/dL)    RDW 78.2  95.6 - 21.3 (%)    Platelets 183  150 - 400 (K/uL)   COMPREHENSIVE METABOLIC PANEL     Status: Abnormal   Collection Time   04/26/12  5:48 AM      Component Value Range Comment   Sodium 139  135 - 145 (mEq/L)    Potassium 3.4 (*) 3.5 - 5.1 (mEq/L)    Chloride 102  96 - 112 (mEq/L)    CO2 27  19 - 32 (mEq/L)    Glucose, Bld 250 (*) 70 - 99 (mg/dL)    BUN 40 (*) 6 - 23 (mg/dL)    Creatinine, Ser 0.86 (*) 0.50 - 1.10 (mg/dL)    Calcium 9.4  8.4 - 10.5 (mg/dL)    Total Protein 6.7  6.0 - 8.3 (g/dL)    Albumin 3.2 (*) 3.5 - 5.2 (g/dL)  AST 14  0 - 37 (U/L)    ALT 12  0 - 35 (U/L)    Alkaline Phosphatase 71  39 - 117 (U/L)    Total Bilirubin 0.4  0.3 - 1.2 (mg/dL)    GFR calc non Af Amer 39 (*) >90 (mL/min)    GFR calc Af Amer 45 (*) >90 (mL/min)   DIFFERENTIAL     Status: Normal   Collection Time   04/26/12  5:48 AM      Component Value Range Comment   Neutrophils Relative 67  43 - 77 (%)    Neutro Abs 6.1  1.7 - 7.7 (K/uL)    Lymphocytes Relative 22  12 - 46 (%)    Lymphs Abs 2.0  0.7 - 4.0 (K/uL)    Monocytes Relative 9  3 - 12 (%)    Monocytes Absolute 0.8  0.1 - 1.0 (K/uL)    Eosinophils Relative 2  0 - 5 (%)    Eosinophils Absolute 0.2  0.0 - 0.7 (K/uL)    Basophils Relative 0  0 - 1 (%)    Basophils Absolute 0.0  0.0 - 0.1 (K/uL)   GLUCOSE, CAPILLARY     Status: Abnormal   Collection Time   04/26/12  7:37 AM      Component Value Range Comment   Glucose-Capillary 221 (*) 70 - 99 (mg/dL)    Comment 1 Notify RN     URINALYSIS, ROUTINE W REFLEX MICROSCOPIC     Status: Abnormal   Collection Time   04/26/12  8:12 AM      Component Value Range Comment   Color, Urine YELLOW  YELLOW     APPearance CLOUDY (*) CLEAR     Specific Gravity, Urine 1.018  1.005 - 1.030     pH 7.0  5.0 - 8.0      Glucose, UA NEGATIVE  NEGATIVE (mg/dL)    Hgb urine dipstick NEGATIVE  NEGATIVE     Bilirubin Urine NEGATIVE  NEGATIVE     Ketones, ur 15 (*) NEGATIVE (mg/dL)    Protein, ur 30 (*) NEGATIVE (mg/dL)    Urobilinogen, UA 1.0  0.0 - 1.0 (mg/dL)    Nitrite NEGATIVE  NEGATIVE     Leukocytes, UA MODERATE (*) NEGATIVE    URINE MICROSCOPIC-ADD ON     Status: Abnormal   Collection Time   04/26/12  8:12 AM      Component Value Range Comment   Squamous Epithelial / LPF RARE  RARE     WBC, UA 21-50  <3 (WBC/hpf)    Bacteria, UA MANY (*) RARE    GLUCOSE, CAPILLARY     Status: Abnormal   Collection Time   04/26/12 11:45 AM      Component Value Range Comment   Glucose-Capillary 252 (*) 70 - 99 (mg/dL)    Comment 1 Notify RN     GLUCOSE, CAPILLARY     Status: Abnormal   Collection Time   04/26/12  4:07 PM      Component Value Range Comment   Glucose-Capillary 236 (*) 70 - 99 (mg/dL)    Comment 1 Documented in Chart      Comment 2 Notify RN     GLUCOSE, CAPILLARY     Status: Abnormal   Collection Time   04/26/12  8:21 PM      Component Value Range Comment   Glucose-Capillary 172 (*) 70 - 99 (mg/dL)    Comment 1 Notify RN  GLUCOSE, CAPILLARY     Status: Abnormal   Collection Time   04/27/12 12:20 AM      Component Value Range Comment   Glucose-Capillary 177 (*) 70 - 99 (mg/dL)   GLUCOSE, CAPILLARY     Status: Abnormal   Collection Time   04/27/12  4:37 AM      Component Value Range Comment   Glucose-Capillary 226 (*) 70 - 99 (mg/dL)    Comment 1 Notify RN     GLUCOSE, CAPILLARY     Status: Abnormal   Collection Time   04/27/12  7:35 AM      Component Value Range Comment   Glucose-Capillary 269 (*) 70 - 99 (mg/dL)    Comment 1 Notify RN        HEENT: NG tube Cardio: irregular Resp: CTA B/L GI: BS positive and Distention Extremity:  Pulses positive and No Edema Skin:   Intact Neuro: Alert/Oriented, Abnormal FMC Ataxic/ dec FMC, Tone:  Within Normal Limits and Dysarthric Musc/Skel:   Normal   Assessment/Plan: 1. Functional deficits secondary to L Vertebral artery infarct which require 3+ hours per day of interdisciplinary therapy in a comprehensive inpatient rehab setting. Physiatrist is providing close team supervision and 24 hour management of active medical problems listed below. Physiatrist and rehab team continue to assess barriers to discharge/monitor patient progress toward functional and medical goals. FIM: FIM - Bathing Bathing Steps Patient Completed: Chest;Right Arm;Left Arm;Abdomen;Front perineal area;Buttocks;Right lower leg (including foot);Left upper leg;Right upper leg;Left lower leg (including foot) Bathing: 4: Steadying assist  FIM - Upper Body Dressing/Undressing Upper body dressing/undressing steps patient completed: Thread/unthread right bra strap;Thread/unthread left bra strap;Hook/unhook bra;Thread/unthread right sleeve of front closure shirt/dress;Thread/unthread left sleeve of front closure shirt/dress;Pull shirt around back of front closure shirt/dress;Button/unbutton shirt Upper body dressing/undressing: 5: Set-up assist to: Obtain clothing/put away FIM - Lower Body Dressing/Undressing Lower body dressing/undressing steps patient completed: Thread/unthread right underwear leg;Thread/unthread left underwear leg;Pull underwear up/down;Thread/unthread right pants leg;Thread/unthread left pants leg;Pull pants up/down;Don/Doff right sock;Don/Doff right shoe Lower body dressing/undressing: 4: Min-Patient completed 75 plus % of tasks  FIM - Toileting Toileting steps completed by patient: Adjust clothing prior to toileting;Performs perineal hygiene;Adjust clothing after toileting Toileting Assistive Devices: Grab bar or rail for support Toileting: 4: Steadying assist  FIM - Diplomatic Services operational officer Devices: Grab bars Toilet Transfers: 4-From toilet/BSC: Min A (steadying Pt. > 75%);4-To toilet/BSC: Min A (steadying Pt. > 75%)  FIM  - Bed/Chair Transfer Bed/Chair Transfer: 4: Supine > Sit: Min A (steadying Pt. > 75%/lift 1 leg);4: Bed > Chair or W/C: Min A (steadying Pt. > 75%)  FIM - Locomotion: Wheelchair Distance: 30 Locomotion: Wheelchair: 1: Travels less than 50 ft with minimal assistance (Pt.>75%) FIM - Locomotion: Ambulation Ambulation/Gait Assistance: 1: +2 Total assist Locomotion: Ambulation: 1: Two helpers  Comprehension Comprehension Mode: Auditory Comprehension: 6-Follows complex conversation/direction: With extra time/assistive device  Expression Expression Mode: Verbal Expression: 5-Expresses complex 90% of the time/cues < 10% of the time  Social Interaction Social Interaction: 5-Interacts appropriately 90% of the time - Needs monitoring or encouragement for participation or interaction.  Problem Solving Problem Solving: 5-Solves complex 90% of the time/cues < 10% of the time  Memory Memory: 5-Recognizes or recalls 90% of the time/requires cueing < 10% of the time  2. Anticoagulation/DVT prophylaxis with Pharmaceutical: Lovenox  Medical Problem List and Plan:  1. left posterior circulation infarcts with left hemiataxia. Current plan is to continue aspirin therapy until by mouth or permanent feeding  access and then consider Pradaxa versus adding Plavix  2. DVT Prophylaxis/Anticoagulation: Subcutaneous Lovenox. Monitor platelet counts any signs of bleeding  3. Dysphagia. Nasogastric tube feeds. Patient currently n.p.o. Speech therapy followup  4. Atrial fibrillation. Cardiac rate control. Continue Cardizem 60 mg every 6 hours.  5. Hypertension. Avapro 150 mg daily. Monitor when out of bed  6. Diabetes mellitus. Hemoglobin A1c 7.5. Sliding scale insulin. Continue check blood sugars as directed. Patient on Levemir 16-32 units twice a day and NovoLog 14-32 units 3 times a day prior to admission. Will resume scheduled insulin therapy as tolerated  7. Hypothyroidism. Synthroid  8. History of vulvar  cancer. Status post radical vulvectomy 8 weeks ago. Currently no chemotherapy or radiation therapy. Followup oncology services as directed .  Foley cath D/C no with incontinence 9. Hyperlipidemia. Lipitor 10.  Catheter associated UTI cipro pending culture   LOS (Days) 2 A FACE TO FACE EVALUATION WAS PERFORMED  Davina Howlett E 04/27/2012, 8:19 AM

## 2012-04-28 LAB — GLUCOSE, CAPILLARY
Glucose-Capillary: 197 mg/dL — ABNORMAL HIGH (ref 70–99)
Glucose-Capillary: 201 mg/dL — ABNORMAL HIGH (ref 70–99)
Glucose-Capillary: 258 mg/dL — ABNORMAL HIGH (ref 70–99)

## 2012-04-28 MED ORDER — INSULIN GLARGINE 100 UNIT/ML ~~LOC~~ SOLN
15.0000 [IU] | Freq: Every day | SUBCUTANEOUS | Status: DC
  Administered 2012-04-28 – 2012-04-29 (×2): 15 [IU] via SUBCUTANEOUS

## 2012-04-28 NOTE — Progress Notes (Signed)
Patient ID: [name], female   DOB: 06/24/1926, 78 y.o.   MRN: [id number] Subjective/Complaints:   Objective: Vital Signs: Blood pressure 139/63, pulse 99, temperature 97.5 F (36.4 C), temperature source Oral, resp. rate 18, weight 53.8 kg (118 lb 9.7 oz), SpO2 95.00%. No results found. Results for orders placed during the hospital encounter of 04/25/12 (from the past 72 hour(s))  GLUCOSE, CAPILLARY     Status: Abnormal   Collection Time   04/26/12  7:37 AM      Component Value Range Comment   Glucose-Capillary 221 (*) 70 - 99 (mg/dL)    Comment 1 Notify RN     URINALYSIS, ROUTINE W REFLEX MICROSCOPIC     Status: Abnormal   Collection Time   04/26/12  8:12 AM      Component Value Range Comment   Color, Urine YELLOW  YELLOW     APPearance CLOUDY (*) CLEAR     Specific Gravity, Urine 1.018  1.005 - 1.030     pH 7.0  5.0 - 8.0     Glucose, UA NEGATIVE  NEGATIVE (mg/dL)    Hgb urine dipstick NEGATIVE  NEGATIVE     Bilirubin Urine NEGATIVE  NEGATIVE     Ketones, ur 15 (*) NEGATIVE (mg/dL)    Protein, ur 30 (*) NEGATIVE (mg/dL)    Urobilinogen, UA 1.0  0.0 - 1.0 (mg/dL)    Nitrite NEGATIVE  NEGATIVE     Leukocytes, UA MODERATE (*) NEGATIVE    URINE CULTURE     Status: Normal (Preliminary result)   Collection Time   04/26/12  8:12 AM      Component Value Range Comment   Specimen Description URINE, CLEAN CATCH      Special Requests NONE      Culture  Setup Time [id number]      Colony Count >=100,000 COLONIES/ML      Culture GRAM NEGATIVE RODS      Report Status PENDING     URINE MICROSCOPIC-ADD ON     Status: Abnormal   Collection Time   04/26/12  8:12 AM      Component Value Range Comment   Squamous Epithelial / LPF RARE  RARE     WBC, UA 21-50  <3 (WBC/hpf)    Bacteria, UA MANY (*) RARE    GLUCOSE, CAPILLARY     Status: Abnormal   Collection Time   04/26/12 11:45 AM      Component Value Range Comment   Glucose-Capillary 252 (*) 70 - 99 (mg/dL)    Comment 1 Notify RN       GLUCOSE, CAPILLARY     Status: Abnormal   Collection Time   04/26/12  4:07 PM      Component Value Range Comment   Glucose-Capillary 236 (*) 70 - 99 (mg/dL)    Comment 1 Documented in Chart      Comment 2 Notify RN     GLUCOSE, CAPILLARY     Status: Abnormal   Collection Time   04/26/12  8:21 PM      Component Value Range Comment   Glucose-Capillary 172 (*) 70 - 99 (mg/dL)    Comment 1 Notify RN     GLUCOSE, CAPILLARY     Status: Abnormal   Collection Time   04/27/12 12:20 AM      Component Value Range Comment   Glucose-Capillary 177 (*) 70 - 99 (mg/dL)   GLUCOSE, CAPILLARY     Status: Abnormal   Collection Time  04/27/12  4:37 AM      Component Value Range Comment   Glucose-Capillary 226 (*) 70 - 99 (mg/dL)    Comment 1 Notify RN     GLUCOSE, CAPILLARY     Status: Abnormal   Collection Time   04/27/12  7:35 AM      Component Value Range Comment   Glucose-Capillary 269 (*) 70 - 99 (mg/dL)    Comment 1 Notify RN     GLUCOSE, CAPILLARY     Status: Abnormal   Collection Time   04/27/12 12:02 PM      Component Value Range Comment   Glucose-Capillary 182 (*) 70 - 99 (mg/dL)    Comment 1 Notify RN     GLUCOSE, CAPILLARY     Status: Abnormal   Collection Time   04/27/12  4:27 PM      Component Value Range Comment   Glucose-Capillary 213 (*) 70 - 99 (mg/dL)    Comment 1 Notify RN     GLUCOSE, CAPILLARY     Status: Abnormal   Collection Time   04/27/12  8:07 PM      Component Value Range Comment   Glucose-Capillary 247 (*) 70 - 99 (mg/dL)    Comment 1 Notify RN     GLUCOSE, CAPILLARY     Status: Abnormal   Collection Time   04/28/12 12:12 AM      Component Value Range Comment   Glucose-Capillary 204 (*) 70 - 99 (mg/dL)    Comment 1 Notify RN     GLUCOSE, CAPILLARY     Status: Abnormal   Collection Time   04/28/12  4:11 AM      Component Value Range Comment   Glucose-Capillary 189 (*) 70 - 99 (mg/dL)    Comment 1 Notify RN     GLUCOSE, CAPILLARY     Status: Abnormal   Collection  Time   04/28/12  8:04 AM      Component Value Range Comment   Glucose-Capillary 268 (*) 70 - 99 (mg/dL)   GLUCOSE, CAPILLARY     Status: Abnormal   Collection Time   04/28/12 11:52 AM      Component Value Range Comment   Glucose-Capillary 197 (*) 70 - 99 (mg/dL)   GLUCOSE, CAPILLARY     Status: Abnormal   Collection Time   04/28/12  4:41 PM      Component Value Range Comment   Glucose-Capillary 201 (*) 70 - 99 (mg/dL)   GLUCOSE, CAPILLARY     Status: Abnormal   Collection Time   04/28/12  7:59 PM      Component Value Range Comment   Glucose-Capillary 258 (*) 70 - 99 (mg/dL)    Comment 1 Notify RN     GLUCOSE, CAPILLARY     Status: Abnormal   Collection Time   04/28/12 11:58 PM      Component Value Range Comment   Glucose-Capillary 171 (*) 70 - 99 (mg/dL)    Comment 1 Notify RN     GLUCOSE, CAPILLARY     Status: Abnormal   Collection Time   04/29/12  4:20 AM      Component Value Range Comment   Glucose-Capillary 215 (*) 70 - 99 (mg/dL)    Comment 1 Notify RN        HEENT: normal Cardio: RRR Resp: CTA B/L GI: BS positive Extremity:  No Edema Skin:   Bruise RLQ and LLQ at lovenox injection sites Neuro: Alert/Oriented, Dysarthric and Other  truncal ataxia Musc/Skel:  Normal   Assessment/Plan: 1. 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 Physiatrist is providing close team supervision and 24 hour management of active medical problems listed below. Physiatrist and rehab team continue to assess barriers to discharge/monitor patient progress toward functional and medical goals. FIM: FIM - Bathing Bathing Steps Patient Completed: Chest;Right Arm;Left Arm;Abdomen;Front perineal area;Buttocks;Right upper leg;Left upper leg Bathing: 4: Min-Patient completes 8-9 61f 10 parts or 75+ percent  FIM - Upper Body Dressing/Undressing Upper  body dressing/undressing steps patient completed: Thread/unthread right bra strap;Thread/unthread left bra strap;Thread/unthread right sleeve of pullover shirt/dresss;Thread/unthread left sleeve of pullover shirt/dress;Put head through opening of pull over shirt/dress;Pull shirt over trunk Upper body dressing/undressing: 4: Min-Patient completed 75 plus % of tasks FIM - Lower Body Dressing/Undressing Lower body dressing/undressing steps patient completed: Thread/unthread right underwear leg;Thread/unthread left underwear leg;Pull underwear up/down;Thread/unthread right pants leg;Thread/unthread left pants leg;Pull pants up/down Lower body dressing/undressing: 4: Steadying Assist  FIM - Toileting Toileting steps completed by patient: Adjust clothing prior to toileting;Performs perineal hygiene;Adjust clothing after toileting Toileting Assistive Devices: Grab bar or rail for support Toileting: 5: Supervision: Safety issues/verbal cues  FIM - Diplomatic Services operational officer Devices: Grab bars Toilet Transfers: 4-To toilet/BSC: Min A (steadying Pt. > 75%);4-From toilet/BSC: Min A (steadying Pt. > 75%)  FIM - Bed/Chair Transfer Bed/Chair Transfer Assistive Devices: Bed rails Bed/Chair Transfer: 4: Supine > Sit: Min A (steadying Pt. > 75%/lift 1 leg);4: Sit > Supine: Min A (steadying pt. > 75%/lift 1 leg);4: Bed > Chair or W/C: Min A (steadying Pt. > 75%);4: Chair or W/C > Bed: Min A (steadying Pt. > 75%)  FIM - Locomotion: Wheelchair Distance: 30 Locomotion: Wheelchair: 1: Travels less than 50 ft with minimal assistance (Pt.>75%) FIM - Locomotion: Ambulation Ambulation/Gait Assistance: 1: +2 Total assist Locomotion: Ambulation: 1: Two helpers  Comprehension Comprehension Mode: Auditory Comprehension: 4-Understands basic 75 - 89% of the time/requires cueing 10 - 24% of the time  Expression Expression Mode: Verbal Expression: 5-Expresses basic needs/ideas: With no  assist  Social Interaction Social Interaction: 5-Interacts appropriately 90% of the time - Needs monitoring or encouragement for participation or interaction.  Problem Solving Problem Solving: 4-Solves basic 75 - 89% of the time/requires cueing 10 - 24% of the time  Memory Memory: 4-Recognizes or recalls 75 - 89% of the time/requires cueing 10 - 24% of the time  2. Anticoagulation/DVT prophylaxis with Pharmaceutical: Lovenox 3. Pain Management: acetominophen 4.  DM- Slowly titrating lantus, uses levimir at home   LOS (Days) 4 A FACE TO FACE EVALUATION WAS PERFORMED  KIRSTEINS,ANDREW E 04/29/2012, 7:29 AM

## 2012-04-28 NOTE — Progress Notes (Signed)
Patient ID: [name], female   DOB: 06-16-1926, 78 y.o.   MRN: [id number] HPI: 78 year old right-handed female with history of hypertension diabetes mellitus as well as right brain stroke 2001. Patient with recent radical vulvectomy 8 weeks ago for recurrent vulvar cancer. She is currently not undergoing chemotherapy or radiation therapy. Admitted 04/21/2012 after episode of nausea vomiting as well as a fall when she attempted to get out of bed. Her husband called 911 she was brought to the emergency room. In the emergency room she was found to have atrial fibrillation with RVR. MRI of the brain showed moderate-sized acute nonhemorrhagic infarction in inferior left cerebellum and posterior lateral aspect of the left medulla  Subjective/Complaints: No congestion slept well Objective: Vital Signs: Blood pressure 115/67, pulse 98, temperature 98 F (36.7 C), temperature source Oral, resp. rate 18, weight 53.7 kg (118 lb 6.2 oz), SpO2 94.00%. No results found. Results for orders placed during the hospital encounter of 04/25/12 (from the past 72 hour(s))  GLUCOSE, CAPILLARY     Status: Abnormal   Collection Time   04/25/12  8:24 PM      Component Value Range Comment   Glucose-Capillary 154 (*) 70 - 99 (mg/dL)    Comment 1 Notify RN     MRSA PCR SCREENING     Status: Normal   Collection Time   04/25/12  9:03 PM      Component Value Range Comment   MRSA by PCR NEGATIVE  NEGATIVE    GLUCOSE, CAPILLARY     Status: Abnormal   Collection Time   04/26/12 12:04 AM      Component Value Range Comment   Glucose-Capillary 233 (*) 70 - 99 (mg/dL)    Comment 1 Notify RN     GLUCOSE, CAPILLARY     Status: Abnormal   Collection Time   04/26/12  3:56 AM      Component Value Range Comment   Glucose-Capillary 255 (*) 70 - 99 (mg/dL)    Comment 1 Notify RN     CBC     Status: Normal   Collection Time   04/26/12  5:48 AM      Component Value Range Comment   WBC 9.1  4.0 - 10.5 (K/uL)    RBC 4.44  3.87 - 5.11  (MIL/uL)    Hemoglobin 14.1  12.0 - 15.0 (g/dL)    HCT 40.9  81.1 - 91.4 (%)    MCV 91.9  78.0 - 100.0 (fL)    MCH 31.8  26.0 - 34.0 (pg)    MCHC 34.6  30.0 - 36.0 (g/dL)    RDW 78.2  95.6 - 21.3 (%)    Platelets 183  150 - 400 (K/uL)   COMPREHENSIVE METABOLIC PANEL     Status: Abnormal   Collection Time   04/26/12  5:48 AM      Component Value Range Comment   Sodium 139  135 - 145 (mEq/L)    Potassium 3.4 (*) 3.5 - 5.1 (mEq/L)    Chloride 102  96 - 112 (mEq/L)    CO2 27  19 - 32 (mEq/L)    Glucose, Bld 250 (*) 70 - 99 (mg/dL)    BUN 40 (*) 6 - 23 (mg/dL)    Creatinine, Ser 0.86 (*) 0.50 - 1.10 (mg/dL)    Calcium 9.4  8.4 - 10.5 (mg/dL)    Total Protein 6.7  6.0 - 8.3 (g/dL)    Albumin 3.2 (*) 3.5 - 5.2 (g/dL)  AST 14  0 - 37 (U/L)    ALT 12  0 - 35 (U/L)    Alkaline Phosphatase 71  39 - 117 (U/L)    Total Bilirubin 0.4  0.3 - 1.2 (mg/dL)    GFR calc non Af Amer 39 (*) >90 (mL/min)    GFR calc Af Amer 45 (*) >90 (mL/min)   DIFFERENTIAL     Status: Normal   Collection Time   04/26/12  5:48 AM      Component Value Range Comment   Neutrophils Relative 67  43 - 77 (%)    Neutro Abs 6.1  1.7 - 7.7 (K/uL)    Lymphocytes Relative 22  12 - 46 (%)    Lymphs Abs 2.0  0.7 - 4.0 (K/uL)    Monocytes Relative 9  3 - 12 (%)    Monocytes Absolute 0.8  0.1 - 1.0 (K/uL)    Eosinophils Relative 2  0 - 5 (%)    Eosinophils Absolute 0.2  0.0 - 0.7 (K/uL)    Basophils Relative 0  0 - 1 (%)    Basophils Absolute 0.0  0.0 - 0.1 (K/uL)   GLUCOSE, CAPILLARY     Status: Abnormal   Collection Time   04/26/12  7:37 AM      Component Value Range Comment   Glucose-Capillary 221 (*) 70 - 99 (mg/dL)    Comment 1 Notify RN     URINALYSIS, ROUTINE W REFLEX MICROSCOPIC     Status: Abnormal   Collection Time   04/26/12  8:12 AM      Component Value Range Comment   Color, Urine YELLOW  YELLOW     APPearance CLOUDY (*) CLEAR     Specific Gravity, Urine 1.018  1.005 - 1.030     pH 7.0  5.0 - 8.0      Glucose, UA NEGATIVE  NEGATIVE (mg/dL)    Hgb urine dipstick NEGATIVE  NEGATIVE     Bilirubin Urine NEGATIVE  NEGATIVE     Ketones, ur 15 (*) NEGATIVE (mg/dL)    Protein, ur 30 (*) NEGATIVE (mg/dL)    Urobilinogen, UA 1.0  0.0 - 1.0 (mg/dL)    Nitrite NEGATIVE  NEGATIVE     Leukocytes, UA MODERATE (*) NEGATIVE    URINE CULTURE     Status: Normal (Preliminary result)   Collection Time   04/26/12  8:12 AM      Component Value Range Comment   Specimen Description URINE, CLEAN CATCH      Special Requests NONE      Culture  Setup Time [id number]      Colony Count >=100,000 COLONIES/ML      Culture GRAM NEGATIVE RODS      Report Status PENDING     URINE MICROSCOPIC-ADD ON     Status: Abnormal   Collection Time   04/26/12  8:12 AM      Component Value Range Comment   Squamous Epithelial / LPF RARE  RARE     WBC, UA 21-50  <3 (WBC/hpf)    Bacteria, UA MANY (*) RARE    GLUCOSE, CAPILLARY     Status: Abnormal   Collection Time   04/26/12 11:45 AM      Component Value Range Comment   Glucose-Capillary 252 (*) 70 - 99 (mg/dL)    Comment 1 Notify RN     GLUCOSE, CAPILLARY     Status: Abnormal   Collection Time   04/26/12  4:07 PM  Component Value Range Comment   Glucose-Capillary 236 (*) 70 - 99 (mg/dL)    Comment 1 Documented in Chart      Comment 2 Notify RN     GLUCOSE, CAPILLARY     Status: Abnormal   Collection Time   04/26/12  8:21 PM      Component Value Range Comment   Glucose-Capillary 172 (*) 70 - 99 (mg/dL)    Comment 1 Notify RN     GLUCOSE, CAPILLARY     Status: Abnormal   Collection Time   04/27/12 12:20 AM      Component Value Range Comment   Glucose-Capillary 177 (*) 70 - 99 (mg/dL)   GLUCOSE, CAPILLARY     Status: Abnormal   Collection Time   04/27/12  4:37 AM      Component Value Range Comment   Glucose-Capillary 226 (*) 70 - 99 (mg/dL)    Comment 1 Notify RN     GLUCOSE, CAPILLARY     Status: Abnormal   Collection Time   04/27/12  7:35 AM      Component Value  Range Comment   Glucose-Capillary 269 (*) 70 - 99 (mg/dL)    Comment 1 Notify RN     GLUCOSE, CAPILLARY     Status: Abnormal   Collection Time   04/27/12 12:02 PM      Component Value Range Comment   Glucose-Capillary 182 (*) 70 - 99 (mg/dL)    Comment 1 Notify RN     GLUCOSE, CAPILLARY     Status: Abnormal   Collection Time   04/27/12  4:27 PM      Component Value Range Comment   Glucose-Capillary 213 (*) 70 - 99 (mg/dL)    Comment 1 Notify RN     GLUCOSE, CAPILLARY     Status: Abnormal   Collection Time   04/27/12  8:07 PM      Component Value Range Comment   Glucose-Capillary 247 (*) 70 - 99 (mg/dL)    Comment 1 Notify RN     GLUCOSE, CAPILLARY     Status: Abnormal   Collection Time   04/28/12 12:12 AM      Component Value Range Comment   Glucose-Capillary 204 (*) 70 - 99 (mg/dL)    Comment 1 Notify RN     GLUCOSE, CAPILLARY     Status: Abnormal   Collection Time   04/28/12  4:11 AM      Component Value Range Comment   Glucose-Capillary 189 (*) 70 - 99 (mg/dL)    Comment 1 Notify RN     GLUCOSE, CAPILLARY     Status: Abnormal   Collection Time   04/28/12  8:04 AM      Component Value Range Comment   Glucose-Capillary 268 (*) 70 - 99 (mg/dL)      HEENT: NG tube Cardio: irregular Resp: CTA B/L GI: BS positive and Distention Extremity:  Pulses positive and No Edema Skin:   Intact Neuro: Alert/Oriented, Abnormal FMC Ataxic/ dec FMC, Tone:  Within Normal Limits and Dysarthric Musc/Skel:  Normal   Assessment/Plan: 1. Functional deficits secondary to L Vertebral artery infarct which require 3+ hours per day of interdisciplinary therapy in a comprehensive inpatient rehab setting. Physiatrist is providing close team supervision and 24 hour management of active medical problems listed below. Physiatrist and rehab team continue to assess barriers to discharge/monitor patient progress toward functional and medical goals. FIM: FIM - Bathing Bathing Steps Patient Completed:  Chest;Right Arm;Left Arm;Abdomen;Front  perineal area;Right upper leg;Left upper leg;Right lower leg (including foot);Left lower leg (including foot) Bathing: 4: Min-Patient completes 8-9 65f 10 parts or 75+ percent  FIM - Upper Body Dressing/Undressing Upper body dressing/undressing steps patient completed: Thread/unthread right bra strap;Thread/unthread left bra strap;Hook/unhook bra;Thread/unthread right sleeve of front closure shirt/dress;Thread/unthread left sleeve of front closure shirt/dress Upper body dressing/undressing: 3: Mod-Patient completed 50-74% of tasks FIM - Lower Body Dressing/Undressing Lower body dressing/undressing steps patient completed: Thread/unthread right underwear leg;Thread/unthread left underwear leg;Pull underwear up/down;Thread/unthread right pants leg;Thread/unthread left pants leg;Pull pants up/down Lower body dressing/undressing: 3: Mod-Patient completed 50-74% of tasks  FIM - Toileting Toileting steps completed by patient: Adjust clothing prior to toileting;Performs perineal hygiene;Adjust clothing after toileting Toileting Assistive Devices: Grab bar or rail for support Toileting: 5: Supervision: Safety issues/verbal cues  FIM - Diplomatic Services operational officer Devices: Grab bars Toilet Transfers: 4-From toilet/BSC: Min A (steadying Pt. > 75%);4-To toilet/BSC: Min A (steadying Pt. > 75%)  FIM - Bed/Chair Transfer Bed/Chair Transfer Assistive Devices: Bed rails Bed/Chair Transfer: 4: Supine > Sit: Min A (steadying Pt. > 75%/lift 1 leg);4: Sit > Supine: Min A (steadying pt. > 75%/lift 1 leg);4: Bed > Chair or W/C: Min A (steadying Pt. > 75%)  FIM - Locomotion: Wheelchair Distance: 30 Locomotion: Wheelchair: 1: Travels less than 50 ft with minimal assistance (Pt.>75%) FIM - Locomotion: Ambulation Ambulation/Gait Assistance: 1: +2 Total assist Locomotion: Ambulation: 1: Two helpers  Comprehension Comprehension Mode: Auditory Comprehension:  5-Follows basic conversation/direction: With extra time/assistive device  Expression Expression Mode: Verbal Expression: 5-Expresses basic needs/ideas: With extra time/assistive device  Social Interaction Social Interaction: 5-Interacts appropriately 90% of the time - Needs monitoring or encouragement for participation or interaction.  Problem Solving Problem Solving: 5-Solves basic 90% of the time/requires cueing < 10% of the time  Memory Memory: 4-Recognizes or recalls 75 - 89% of the time/requires cueing 10 - 24% of the time  2. Anticoagulation/DVT prophylaxis with Pharmaceutical: Lovenox  Medical Problem List and Plan:  1. left posterior circulation infarcts with left hemiataxia. Current plan is to continue aspirin therapy until by mouth or permanent feeding access and then consider Pradaxa versus adding Plavix  2. DVT Prophylaxis/Anticoagulation: Subcutaneous Lovenox. Monitor platelet counts any signs of bleeding  3. Dysphagia. Nasogastric tube feeds. Patient currently n.p.o. Speech therapy followup  4. Atrial fibrillation. Cardiac rate control. Continue Cardizem 60 mg every 6 hours.  5. Hypertension. Avapro 150 mg daily. Monitor when out of bed  6. Diabetes mellitus. Hemoglobin A1c 7.5. Sliding scale insulin. Continue check blood sugars as directed. Patient on Levemir 16-32 units twice a day and NovoLog 14-32 units 3 times a day prior to admission. Will resume scheduled insulin therapy as tolerated Increase lantus to 15 Units 7. Hypothyroidism. Synthroid  8. History of vulvar cancer. Status post radical vulvectomy 8 weeks ago. Currently no chemotherapy or radiation therapy. Followup oncology services as directed .  Foley cath D/C no with incontinence 9. Hyperlipidemia. Lipitor 10.  Catheter associated UTI cipro ecoli contributing to elevated CBGs   LOS (Days) 3 A FACE TO FACE EVALUATION WAS PERFORMED  KIRSTEINS,ANDREW E 04/28/2012, 9:38 AM

## 2012-04-28 NOTE — Progress Notes (Signed)
Occupational Therapy Session Note  Patient Details  Name: [name] MRN: [id number] Date of Birth: 1926/04/29  Today's Date: 04/28/2012 Time: 1115-1200 Time Calculation (min): 45 min  Short Term Goals: Week 1:  OT Short Term Goal 1 (Week 1): Pt will transfer to toilet with supervision with or without RW. OT Short Term Goal 1 - Progress (Week 1): Progressing toward goal OT Short Term Goal 2 (Week 1): Pt will dress LB with supervision. OT Short Term Goal 2 - Progress (Week 1): Progressing toward goal OT Short Term Goal 3 (Week 1): Pt will perform simple meal prep with supervision. OT Short Term Goal 3 - Progress (Week 1): Progressing toward goal  Skilled Therapeutic Interventions/Progress Updates: ADL-retraining at walk-in shower with emphasis on improved endurance, dynamic sitting and standing balance, neuromuscular re-ed, safety awareness, patient ed on effective use of DME and AE, and fine motor control of bilateral UE.   Patient completed bed mobility, transfers, and toileting prior to bathing, without evidence of LOB although requiring constant supervision to manage IV pole and NG tube due to inattention.   Patient requires verbal cues to progress through activity but responds better to supervision where decision-making responsibilities are assumed by caregivers.  Patient expressed satisfaction with treatment and recovered back to bed, preferring continued rest over any physical activity.  Therapy Documentation Precautions:  Precautions Precautions: Fall Precaution Comments: NPO Restrictions Weight Bearing Restrictions: No  Pain: Pain Assessment Pain Assessment: No/denies pain  ADL: ADL Eating: NPO Grooming: Setup Upper Body Bathing: Setup Lower Body Bathing: Contact guard Upper Body Dressing: Setup Lower Body Dressing: Minimal assistance Toileting: Not assessed Where Assessed-Toileting: Bedside Commode Toilet Transfer: Minimal assistance Toilet Transfer Method: Stand  pivot Tub/Shower Transfer: Not assessed Psychologist, counselling Transfer: Not assessed ADL Comments: Pt fatigues extremely easily.  See FIM for current functional status  Therapy: Individual Therapy  Georgeanne Nim 04/28/2012, 1:29 PM

## 2012-04-29 DIAGNOSIS — Z5189 Encounter for other specified aftercare: Secondary | ICD-10-CM

## 2012-04-29 DIAGNOSIS — I69993 Ataxia following unspecified cerebrovascular disease: Secondary | ICD-10-CM

## 2012-04-29 DIAGNOSIS — I69991 Dysphagia following unspecified cerebrovascular disease: Secondary | ICD-10-CM

## 2012-04-29 LAB — GLUCOSE, CAPILLARY
Glucose-Capillary: 171 mg/dL — ABNORMAL HIGH (ref 70–99)
Glucose-Capillary: 178 mg/dL — ABNORMAL HIGH (ref 70–99)
Glucose-Capillary: 216 mg/dL — ABNORMAL HIGH (ref 70–99)

## 2012-04-29 NOTE — Progress Notes (Signed)
Speech Language Pathology Daily Session Note  Patient Details  Name: [name] MRN: [id number] Date of Birth: Feb 26, 1926  Today's Date: 04/29/2012 Time: 0930-1025 Time Calculation (min): 55 min  Short Term Goals: Week 1:  See BSE     Skilled Therapeutic Interventions: Session focused on skilled treatment of dysphagia with SLP facilitating session with NMES in 3a placement to facilitate hyolaryngeal excursion with ice chips and moderate assist semantic cues to perform hard effortful swallows and utilize swallow after throat clear. SLP also educated patient, RN and nurse tech on need for covered younkers at bedside.  Daily Session Precautions/Restrictions  Restrictions Weight Bearing Restrictions: No FIM:  Comprehension Comprehension Mode: Auditory Comprehension: 5-Follows basic conversation/direction: With no assist Expression Expression Mode: Verbal Expression: 5-Expresses basic needs/ideas: With no assist Social Interaction Social Interaction: 6-Interacts appropriately with others with medication or extra time (anti-anxiety, antidepressant). Problem Solving Problem Solving: 5-Solves basic 90% of the time/requires cueing < 10% of the time Memory Memory: 4-Recognizes or recalls 75 - 89% of the time/requires cueing 10 - 24% of the time General    Pain Pain Assessment Pain Assessment: No/denies pain Pain Score: 0-No pain  Therapy/Group: Individual Therapy  [name], CCC-SLP [phone]  Athziry Millican 04/29/2012, 11:18 AM

## 2012-04-29 NOTE — Progress Notes (Signed)
Physical Therapy Note  Patient Details  Name: KIMBER FRITTS MRN: [id number] Date of Birth: 08-03-1926 Today's Date: 04/29/2012  1400-1510 (70 minutes) individual Pain: no complaint of pain Focus of treatment: Therapeutic activities to improve tolerance to activity; gait training/endurance Treatment: sit to stand min assist from wc/ transfer SPT min assist ; NuStep (ergonometer) Level 3 X 5 minutes; Gait with RW 20 feet mod assist for balance with lean to right; gait with rail 20 feet with seated rest break. (2 reps); gait 25 feet min assist holding IV pole.   Cheyenne Bordeaux,JIM 04/29/2012, 4:07 PM

## 2012-04-29 NOTE — Progress Notes (Signed)
Per State Regulation 482.30 This chart was reviewed for medical necessity with respect to the patient's Admission/Duration of stay. Pt participating and progressing in therapies. Poor activity tolerance. DM uncontrolled. DM coordinator in today. [name] Dare                 Nurse Care Manager            Next Review Date: 05/03/12

## 2012-04-29 NOTE — Progress Notes (Signed)
Occupational Therapy Session Note  Patient Details  Name: [name] MRN: [id number] Date of Birth: 07-07-1926  Today's Date: 04/29/2012 Time: 0802-0855 Time Calculation (min): 53 min  Short Term Goals: Week 1:  OT Short Term Goal 1 (Week 1): Pt will transfer to toilet with supervision with or without RW. OT Short Term Goal 1 - Progress (Week 1): Progressing toward goal OT Short Term Goal 2 (Week 1): Pt will dress LB with supervision. OT Short Term Goal 2 - Progress (Week 1): Progressing toward goal OT Short Term Goal 3 (Week 1): Pt will perform simple meal prep with supervision. OT Short Term Goal 3 - Progress (Week 1): Progressing toward goal  Skilled Therapeutic Interventions/Progress Updates:   Pt seen for BADL retraining of toileting, bathing, and dressing with a focus on activity tolerance.  Pt transferred to [facility] with min assist with stand pivot.  She was able to complete ADLS with steady assist and numerous rest breaks.  After working for 45 minutes patient stated that she had to lay down in bed.  Sat EOB for a few AROM exercises, then put herself into bed with min assist to adjust hips.  Pt completed a few more AROM exercises from supine.  Pt continues to have very low activity tolerance.       Therapy Documentation Precautions:  Precautions Precautions: Fall Precaution Comments: NPO Restrictions Weight Bearing Restrictions: No  Pain: Pain Assessment Pain Assessment: No/denies pain  See FIM for current functional status  Therapy/Group: Individual Therapy  Margel Joens 04/29/2012, 8:59 AM

## 2012-04-29 NOTE — Evaluation (Signed)
Recreational Therapy Assessment and Plan  Patient Details  Name: [name] MRN: [id number] Date of Birth: [birth year] Today's Date: 04/29/2012  Rehab Potential: Good ELOS:     Assessment Clinical Impression:   Patient is an 78 year old right-handed female with history of hypertension diabetes mellitus as well as right brain stroke 2001. Patient with recent radical vulvectomy 8 weeks ago for recurrent vulvar cancer. She is currently not undergoing chemotherapy or radiation therapy. Admitted 04/21/2012 after episode of nausea vomiting as well as a fall when she attempted to get out of bed. Her husband called 911 she was brought to the emergency room. In the emergency room she was found to have a true fibrillation with RVR. MRI of the brain showed moderate-sized acute nonhemorrhagic infarction in inferior left cerebellum and posterior lateral aspect of the left medulla. Echocardiogram with ejection fraction 55-60% without embolus and mild aortic stenosis. Carotid Dopplers with no ICA stenosis. Neurology consulted presently on aspirin and subcutaneous Lovenox however they are considering pradaxa. Cardiology ( DR.Skains) follow for atrial fibrillation and present plans to continue aspirin therapy until able to take by mouth or permanent tube access and then consider Pradaxa versus Plavix. Patient is presently n.p.o. after a failed modified barium swallow 04/22/2012 and nasogastric tube placed for nutritional support. Patient transferred to CIR on 04/25/2012.   Patient presents with decreased activity tolerance, decreased functional mobility, decreased balance,  decreased midline orientation, decreased attention to left Prior to hospitalization, patient was recovering from vulvular CA surgery 8 weeks previously, and was spending most of the day out of bed. She was modified independent, using a SPC occasionally for mobility and lived with Spouse in a House home. Home access is 2Stairs to enter, no rail. Her  husband works 3x/week. PTA, a neighbor helped out after she returned home from the CA surgery.    Plan Min 1 time per week > 20 minutes Recommendations for other services: None  Discharge Criteria: Patient will be discharged from TR if patient refuses treatment 3 consecutive times without medical reason.  If treatment goals not met, if there is a change in medical status, if patient makes no progress towards goals or if patient is discharged from hospital.  The above assessment, treatment plan, treatment alternatives and goals were discussed and mutually agreed upon: by patient  Denorris Reust 04/29/2012, 4:18 PM

## 2012-04-29 NOTE — Progress Notes (Addendum)
Inpatient Diabetes Program Recommendations  AACE/ADA: New Consensus Statement on Inpatient Glycemic Control (2009)  Target Ranges:  Prepandial:   less than 140 mg/dL      Peak postprandial:   less than 180 mg/dL (1-2 hours)      Critically ill patients:  140 - 180 mg/dL   Reason for Visit: Hyperglycemia  Inpatient Diabetes Program Recommendations Insulin - Basal: Pt takes total of 48 units basal Levemir at home. Please increase to 20 units at HS.  Insulin - Meal Coverage: Please consider coverage of tube feed carbohydrate content of tube feeds with 4 units q 4hrs Novolog (takes meal coverage at home 16 units breakfast, 14 units lunch, and 32 units supper). With addition of tube feed coverage, an decrease correction to moderate q 4 hrs.  Note: Thank you, Lenor Coffin, RN, CNS, Diabetes Coordinator [phone])

## 2012-04-30 DIAGNOSIS — Z5189 Encounter for other specified aftercare: Secondary | ICD-10-CM

## 2012-04-30 DIAGNOSIS — I69991 Dysphagia following unspecified cerebrovascular disease: Secondary | ICD-10-CM

## 2012-04-30 DIAGNOSIS — I639 Cerebral infarction, unspecified: Secondary | ICD-10-CM

## 2012-04-30 DIAGNOSIS — I69993 Ataxia following unspecified cerebrovascular disease: Secondary | ICD-10-CM

## 2012-04-30 LAB — GLUCOSE, CAPILLARY
Glucose-Capillary: 163 mg/dL — ABNORMAL HIGH (ref 70–99)
Glucose-Capillary: 257 mg/dL — ABNORMAL HIGH (ref 70–99)
Glucose-Capillary: 312 mg/dL — ABNORMAL HIGH (ref 70–99)

## 2012-04-30 LAB — URINE CULTURE

## 2012-04-30 MED ORDER — INSULIN GLARGINE 100 UNIT/ML ~~LOC~~ SOLN
20.0000 [IU] | Freq: Every day | SUBCUTANEOUS | Status: DC
  Administered 2012-04-30: 20 [IU] via SUBCUTANEOUS

## 2012-04-30 MED ORDER — JEVITY 1.2 CAL PO LIQD
1000.0000 mL | ORAL | Status: DC
  Administered 2012-04-30 – 2012-05-06 (×6): 1000 mL
  Administered 2012-05-07: 06:00:00
  Filled 2012-04-30 (×12): qty 1000

## 2012-04-30 NOTE — Progress Notes (Signed)
Patient ID: [name], female   DOB: 09-11-1926, 78 y.o.   MRN: [id number] Subjective/Complaints: No c/os  Objective: Vital Signs: Blood pressure 132/82, pulse 97, temperature 98 F (36.7 C), temperature source Oral, resp. rate 18, weight 57.3 kg (126 lb 5.2 oz), SpO2 96.00%. No results found. Results for orders placed during the hospital encounter of 04/25/12 (from the past 72 hour(s))  GLUCOSE, CAPILLARY     Status: Abnormal   Collection Time   04/27/12 12:02 PM      Component Value Range Comment   Glucose-Capillary 182 (*) 70 - 99 (mg/dL)    Comment 1 Notify RN     GLUCOSE, CAPILLARY     Status: Abnormal   Collection Time   04/27/12  4:27 PM      Component Value Range Comment   Glucose-Capillary 213 (*) 70 - 99 (mg/dL)    Comment 1 Notify RN     GLUCOSE, CAPILLARY     Status: Abnormal   Collection Time   04/27/12  8:07 PM      Component Value Range Comment   Glucose-Capillary 247 (*) 70 - 99 (mg/dL)    Comment 1 Notify RN     GLUCOSE, CAPILLARY     Status: Abnormal   Collection Time   04/28/12 12:12 AM      Component Value Range Comment   Glucose-Capillary 204 (*) 70 - 99 (mg/dL)    Comment 1 Notify RN     GLUCOSE, CAPILLARY     Status: Abnormal   Collection Time   04/28/12  4:11 AM      Component Value Range Comment   Glucose-Capillary 189 (*) 70 - 99 (mg/dL)    Comment 1 Notify RN     GLUCOSE, CAPILLARY     Status: Abnormal   Collection Time   04/28/12  8:04 AM      Component Value Range Comment   Glucose-Capillary 268 (*) 70 - 99 (mg/dL)   GLUCOSE, CAPILLARY     Status: Abnormal   Collection Time   04/28/12 11:52 AM      Component Value Range Comment   Glucose-Capillary 197 (*) 70 - 99 (mg/dL)   GLUCOSE, CAPILLARY     Status: Abnormal   Collection Time   04/28/12  4:41 PM      Component Value Range Comment   Glucose-Capillary 201 (*) 70 - 99 (mg/dL)   GLUCOSE, CAPILLARY     Status: Abnormal   Collection Time   04/28/12  7:59 PM      Component Value Range Comment   Glucose-Capillary 258 (*) 70 - 99 (mg/dL)    Comment 1 Notify RN     GLUCOSE, CAPILLARY     Status: Abnormal   Collection Time   04/28/12 11:58 PM      Component Value Range Comment   Glucose-Capillary 171 (*) 70 - 99 (mg/dL)    Comment 1 Notify RN     GLUCOSE, CAPILLARY     Status: Abnormal   Collection Time   04/29/12  4:20 AM      Component Value Range Comment   Glucose-Capillary 215 (*) 70 - 99 (mg/dL)    Comment 1 Notify RN     GLUCOSE, CAPILLARY     Status: Abnormal   Collection Time   04/29/12  7:17 AM      Component Value Range Comment   Glucose-Capillary 192 (*) 70 - 99 (mg/dL)    Comment 1 Notify RN  GLUCOSE, CAPILLARY     Status: Abnormal   Collection Time   04/29/12 11:17 AM      Component Value Range Comment   Glucose-Capillary 274 (*) 70 - 99 (mg/dL)   GLUCOSE, CAPILLARY     Status: Abnormal   Collection Time   04/29/12  4:11 PM      Component Value Range Comment   Glucose-Capillary 216 (*) 70 - 99 (mg/dL)   GLUCOSE, CAPILLARY     Status: Abnormal   Collection Time   04/29/12  9:51 PM      Component Value Range Comment   Glucose-Capillary 178 (*) 70 - 99 (mg/dL)   GLUCOSE, CAPILLARY     Status: Abnormal   Collection Time   04/29/12 11:49 PM      Component Value Range Comment   Glucose-Capillary 163 (*) 70 - 99 (mg/dL)   GLUCOSE, CAPILLARY     Status: Abnormal   Collection Time   04/30/12  5:16 AM      Component Value Range Comment   Glucose-Capillary 257 (*) 70 - 99 (mg/dL)    Comment 1 Notify RN        HEENT: normal Cardio: RRR Resp: CTA B/L GI: BS positive Extremity:  No Edema Skin:   Bruise RLQ and LLQ at lovenox injection sites Neuro: Alert/Oriented, Dysarthric and Other truncal ataxia Musc/Skel:  Normal   Assessment/Plan: 1. 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 Physiatrist is providing close  team supervision and 24 hour management of active medical problems listed below. Physiatrist and rehab team continue to assess barriers to discharge/monitor patient progress toward functional and medical goals. FIM: FIM - Bathing Bathing Steps Patient Completed: Chest;Right Arm;Left Arm;Abdomen;Front perineal area;Buttocks;Right lower leg (including foot);Left upper leg;Right upper leg;Left lower leg (including foot) Bathing: 4: Steadying assist  FIM - Upper Body Dressing/Undressing Upper body dressing/undressing steps patient completed: Thread/unthread right bra strap;Thread/unthread left bra strap;Hook/unhook bra;Thread/unthread right sleeve of pullover shirt/dresss;Thread/unthread left sleeve of pullover shirt/dress;Put head through opening of pull over shirt/dress;Pull shirt over trunk Upper body dressing/undressing: 5: Set-up assist to: Obtain clothing/put away FIM - Lower Body Dressing/Undressing Lower body dressing/undressing steps patient completed: Thread/unthread right underwear leg;Thread/unthread left underwear leg;Thread/unthread right pants leg;Pull underwear up/down;Thread/unthread left pants leg;Pull pants up/down;Don/Doff left sock;Don/Doff right shoe Lower body dressing/undressing: 4: Steadying Assist  FIM - Toileting Toileting steps completed by patient: Adjust clothing prior to toileting;Performs perineal hygiene;Adjust clothing after toileting Toileting Assistive Devices: Grab bar or rail for support Toileting: 4: Steadying assist  FIM - Diplomatic Services operational officer Devices: Bedside commode Toilet Transfers: 4-To toilet/BSC: Min A (steadying Pt. > 75%);4-From toilet/BSC: Min A (steadying Pt. > 75%)  FIM - Bed/Chair Transfer Bed/Chair Transfer Assistive Devices: Bed rails Bed/Chair Transfer: 4: Sit > Supine: Min A (steadying pt. > 75%/lift 1 leg);4: Chair or W/C > Bed: Min A (steadying Pt. > 75%)  FIM - Locomotion: Wheelchair Distance: 30 Locomotion:  Wheelchair: 1: Travels less than 50 ft with minimal assistance (Pt.>75%) FIM - Locomotion: Ambulation Ambulation/Gait Assistance: 1: +2 Total assist Locomotion: Ambulation: 1: Two helpers  Comprehension Comprehension Mode: Auditory Comprehension: 5-Follows basic conversation/direction: With no assist  Expression Expression Mode: Verbal Expression: 5-Set-up assist with assistive device  Social Interaction Social Interaction: 6-Interacts appropriately with others with medication or extra time (anti-anxiety, antidepressant).  Problem Solving Problem Solving: 5-Solves basic problems: With no assist  Memory  Memory: 6-More than reasonable amt of time  2. Anticoagulation/DVT prophylaxis with Pharmaceutical: Lovenox 3. Pain Management: acetominophen 4.  DM- Slowly titrating lantus, uses levimir at home, ?repeat MBS an start po feeds soon   LOS (Days) 5 A FACE TO FACE EVALUATION WAS PERFORMED  Paz Winsett E 04/30/2012, 7:35 AM

## 2012-04-30 NOTE — Progress Notes (Signed)
Occupational Therapy Session Note  Patient Details  Name: [name] MRN: [id number] Date of Birth: 17-Dec-1926  Today's Date: 04/30/2012 Visit 1: Time: 0805-0850 Time Calculation (min): 45 min Visit 2: Time: 1100-1140 Time Calculation: 40 minutes  Short Term Goals: Week 1:  OT Short Term Goal 1 (Week 1): Pt will transfer to toilet with supervision with or without RW. OT Short Term Goal 1 - Progress (Week 1): Progressing toward goal OT Short Term Goal 2 (Week 1): Pt will dress LB with supervision. OT Short Term Goal 2 - Progress (Week 1): Progressing toward goal OT Short Term Goal 3 (Week 1): Pt will perform simple meal prep with supervision. OT Short Term Goal 3 - Progress (Week 1): Progressing toward goal  Skilled Therapeutic Interventions/Progress Updates:  Visit 1:   Pt seen for BADL retraining of toileting, bathing, and dressing with a focus on standing tolerance and balance with transfers.  Pt was anxious to take a shower and moved very quickly today.  She was slightly impulsive with standing and transferring quickly.  She accomplished all ADLs including fastening buttons and tying shoes in a very efficient manner.  However, after 45 min of activity she stated that she had to go back to bed and fell asleep immediately.  Therapy session cut short by 10 minutes due to extreme fatigue.   Visit 2: Pt seen this session to address standing balance, transfers with rolling walker.  Pt continued to demonstrate some impulsivity, for ex. Moving from supine to standing quickly when therapist had just asked her to sit for a minute before standing. She stood quickly and then became dizzy.  She moved and turned quickly when ambulating to toilet. In standing she pushes her walker forward and to the left.  Pt worked on simple standing balance exercises, but needed numerous rest breaks.  UE AROM from sitting.      Therapy Documentation Precautions:  Precautions Precautions: Fall Precaution Comments:  NPO Restrictions Weight Bearing Restrictions: No General: General Amount of Missed OT Time (min): 10 Minutes  Pain: Pain Assessment Pain Assessment: 0-10 Pain Score:   5 Pain Type: Acute pain Pain Location: Head Pain Orientation: Posterior Pain Descriptors: Aching Pain Onset: Awakened from sleep Patients Stated Pain Goal: 2 Pain Intervention(s): RN made aware;Heat applied;Rest  See FIM for current functional status  Therapy/Group: Individual Therapy  Valeria Boza 04/30/2012, 9:44 AM

## 2012-04-30 NOTE — Progress Notes (Signed)
Recreational Therapy Session Note  Patient Details  Name: [name] MRN: [id number] Date of Birth: 02/06/1926 Today's Date: 04/30/2012 Time: 8469-6295 Pain: no c/o Skilled Therapeutic Interventions/Progress Updates: Pt participated in tabletop word puzzle with Min cues.  Pt circled words that she had already found 4 times during session without realizing that she had already found the words.    Therapy/Group: Individual Therapy  Activity Level: Simple:    [name] 04/30/2012, 4:30 PM

## 2012-04-30 NOTE — Progress Notes (Signed)
Nutrition Follow-up  Remains NPO. Current TF regimen is providing 1396 kcal, 75 grams protein, 1515 ml free water when run for 24 hours daily. Noted that TF is generally held during therapies, likely not meeting this. Discussed EN regimen with PA, who is agreeable to holding TF with therapies and adjusting formula rate accordingly.  RN reports pt is tolerating feeding well. Pt denies any questions or concerns.  Diet Order:  NPO TF: Jevity 1.2 at 45 ml/hr with 30 ml Prostat daily providing: 1396 kcal, 75 grams protein, 1515 ml free water.  Meds: Scheduled Meds:   . amitriptyline  25 mg Per Tube BID  . antiseptic oral rinse  15 mL Mouth Rinse q12n4p  . aspirin  81 mg Per Tube Daily  . atorvastatin  10 mg Per Tube q1800  . chlorhexidine  15 mL Mouth Rinse BID  . ciprofloxacin  250 mg Oral BID  . diltiazem  60 mg Oral Q6H  . enoxaparin  30 mg Subcutaneous Q24H  . feeding supplement (JEVITY 1.2 CAL)  1,000 mL Per Tube Q24H  . feeding supplement  30 mL Per Tube Daily  . insulin aspart  0-20 Units Subcutaneous Q4H  . insulin glargine  20 Units Subcutaneous QHS  . irbesartan  150 mg Oral Daily  . levothyroxine  100 mcg Per Tube Q0600  . potassium chloride  10 mEq Per Tube BID  . DISCONTD: insulin glargine  15 Units Subcutaneous QHS   Continuous Infusions:  PRN Meds:.acetaminophen, ondansetron (ZOFRAN) IV, ondansetron, phenol, sodium chloride  Labs:  CMP     Component Value Date/Time   NA 139 04/26/2012 0548   K 3.4* 04/26/2012 0548   CL 102 04/26/2012 0548   CO2 27 04/26/2012 0548   GLUCOSE 250* 04/26/2012 0548   BUN 40* 04/26/2012 0548   CREATININE 1.25* 04/26/2012 0548   CALCIUM 9.4 04/26/2012 0548   PROT 6.7 04/26/2012 0548   ALBUMIN 3.2* 04/26/2012 0548   AST 14 04/26/2012 0548   ALT 12 04/26/2012 0548   ALKPHOS 71 04/26/2012 0548   BILITOT 0.4 04/26/2012 0548   GFRNONAA 39* 04/26/2012 0548   GFRAA 45* 04/26/2012 0548    No intake or output data in the 24 hours ending 04/30/12 1051  Weight Status:   57.3 kg - wt up 4 kg Wt Readings from Last 15 Encounters:  04/30/12 126 lb 5.2 oz (57.3 kg)  04/25/12 115 lb 15.4 oz (52.6 kg)  03/29/12 150 lb (68.04 kg)  03/15/12 150 lb 8 oz (68.266 kg)  02/27/12 154 lb (69.854 kg)  02/27/12 154 lb (69.854 kg)  02/23/12 154 lb (69.854 kg)  02/05/12 155 lb 4.8 oz (70.444 kg)  11/03/11 156 lb 11.2 oz (71.079 kg)   Estimated needs:  1400 - 1600 kcal, 65 - 75 grams protein  Nutrition Dx:  Inadequate oral intake r/t severe dysphagia AEB NPO status.  Goal:  EN to meet >90% of estimated nutrition needs - met.  Intervention:   1. To better meet nutrition needs, recommend Jevity 1.2 at 60 ml/hr x 20 hours (anticipate TF to be held for 4 hours daily for participation in therapies). 30 ml Prostat daily. TF regimen will provide: 1540 kcal, 82 grams protein, 968 ml free water. 2. RD to continue to follow  Monitor:  Weights, labs, TF tolerance, PO diet advancement, I/O's  Adair Laundry Pager #:  [phone]

## 2012-04-30 NOTE — Progress Notes (Signed)
Speech Language Pathology Daily Session Note  Patient Details  Name: [name] MRN: [id number] Date of Birth: 03/04/1926  Today's Date: 04/30/2012 Time: 0930-1030 Time Calculation (min): 60 min  Short Term Goals: Week 1:  See BSE     Skilled Therapeutic Interventions: Session focused on skilled dysphagia treatment with SLP facilitating swallow function with NMES 3b placement with only 2 electrodes at 9mA and moderate assist semantic cues to utilize a slow pace of self feeding ice chips as well as to utilize hard effortful swallows.  Patient demonstrated 3-4 swallows per ice chip and 2-3 throat clears.    Daily Session Precautions/Restrictions    FIM:  Comprehension Comprehension Mode: Auditory Comprehension: 5-Follows basic conversation/direction: With no assist Expression Expression Mode: Verbal Expression: 5-Expresses basic needs/ideas: With no assist Social Interaction Social Interaction: 6-Interacts appropriately with others with medication or extra time (anti-anxiety, antidepressant). Problem Solving Problem Solving: 3-Solves basic 50 - 74% of the time/requires cueing 25 - 49% of the time Memory Memory: 4-Recognizes or recalls 75 - 89% of the time/requires cueing 10 - 24% of the time General    Pain Pain Assessment Pain Assessment: No/denies pain Pain Score: 0-No pain Pain Type: Acute pain Pain Location: Head Pain Orientation: Posterior Pain Descriptors: Aching Pain Onset: Awakened from sleep Patients Stated Pain Goal: 2 Pain Intervention(s): Medication (See eMAR);Heat applied  Therapy/Group: Individual Therapy  [name], CCC-SLP [phone]  [name] 04/30/2012, 11:16 AM

## 2012-04-30 NOTE — Progress Notes (Signed)
Physical Therapy Session Note  Patient Details  Name: [name] MRN: [id number] Date of Birth: 1926/10/14  Today's Date: 04/30/2012 Time: 7829-5621 Time Calculation (min): 50 min  Short Term Goals: Week 1:  PT Short Term Goal 1 (Week 1): Pt will perform bed mobility with modified independence, using typical movement patterns. PT Short Term Goal 2 (Week 1): Pt will perform basic transfers with supervision. PT Short Term Goal 3 (Week 1): Pt will perform gait x 75'  in controlled environment, with min assist; in home environment x 50' with min assist. PT Short Term Goal 4 (Week 1): Pt will propel w/c using bil LEs x 50' with supervision, for strengthening.   PT Short Term Goal 5 (Week 1): Pt will ascend and descend 5 steps with 2 rails with min assist.  Skilled Therapeutic Interventions/Progress Updates: focus on activity tolerance, transfer and gait training, w/c mobility, stairs, car transfer .  Husband observed tx.  Husband noted to walk with large home-made cane, with limited standing tolerance.    Pt and husband reported that pt did not have any balance problems after previous CVA in 2001.  Bed> w/c transfer to R with min steady assist.  Car transfer using RW x 2, with max VCs for safety, mod assist.  Pt was exhausted at start of tx, and tended to sit early and impulsively, with L hip sitting on armrest unless max VCs.  Gait with RW x 25' x 2, with mod assist due to pt listing L throughout majority of distance.  Pt is unaware of L leaning, and when cued, is very slow to correct or pushes L further at times.    Wt shifting for smooth transition on/off L LE in tandem position; with mod assist fading to min assist.  Gait up/down 3 steps with 2 rails, with min assist.    Pt tired but urged to stay up until after next therapy.  Therapy Documentation Precautions:  Precautions Precautions: Fall Precaution Comments: NPO Restrictions Weight Bearing Restrictions: No   Pain: Pain  Assessment Pain Assessment: No/denies pain Pain Score: 0-No pain   Locomotion : Ambulation Ambulation: Yes Ambulation/Gait Assistance: 3: Mod assist Ambulation Distance (Feet): 25 Feet Assistive device: Rolling walker Gait Gait: Yes Gait Pattern: Impaired Gait Pattern: Decreased weight shift to right;Narrow base of support;Trendelenburg Stairs / Additional Locomotion Stairs Assistance: 4: Min assist Stairs Assistance Details: Verbal cues for gait pattern Stair Management Technique: Two rails Number of Stairs: 3  Height of Stairs: 4  Naval architect Assistance: 5: Financial planner Details: Verbal cues for Diplomatic Services operational officer: Both upper extremities Wheelchair Parts Management: Needs assistance Distance: 60     Exercises: 10 x 1 alternating knee extension in sitting, with isometric contraction at end range       See FIM for current functional status  Therapy/Group: Individual Therapy  Nesha Counihan 04/30/2012, 2:52 PM

## 2012-04-30 NOTE — Plan of Care (Signed)
Problem: RH BOWEL ELIMINATION Goal: RH STG MANAGE BOWEL WITH ASSISTANCE STG Manage Bowel with Assistance.Modified independence  Outcome: Not Progressing No bowel movement since 04-26-12 Goal: RH STG MANAGE BOWEL W/MEDICATION W/ASSISTANCE STG Manage Bowel with Medication with Assistance.Modified independence  Outcome: Not Progressing No bowel movement since 04-26-12  Problem: RH KNOWLEDGE DEFICIT Goal: RH STG INCREASE KNOWLEDGE OF DYSPHAGIA/FLUID INTAKE Pt will be able to verbalize understanding of dysphagia diet and thickening of liquids per SLP recommendations  Outcome: Not Applicable Date Met:  04/30/12 Patient is NPO

## 2012-05-01 LAB — GLUCOSE, CAPILLARY
Glucose-Capillary: 233 mg/dL — ABNORMAL HIGH (ref 70–99)
Glucose-Capillary: 236 mg/dL — ABNORMAL HIGH (ref 70–99)

## 2012-05-01 MED ORDER — INSULIN GLARGINE 100 UNIT/ML ~~LOC~~ SOLN
25.0000 [IU] | Freq: Every day | SUBCUTANEOUS | Status: DC
  Administered 2012-05-01 – 2012-05-02 (×2): 25 [IU] via SUBCUTANEOUS

## 2012-05-01 MED ORDER — SODIUM CHLORIDE 0.9 % IV SOLN: Freq: Once | INTRAVENOUS | Status: DC

## 2012-05-01 MED ORDER — CEFAZOLIN SODIUM 1-5 GM-% IV SOLN
1.0000 g | INTRAVENOUS | Status: DC
  Filled 2012-05-01: qty 50

## 2012-05-01 NOTE — Progress Notes (Addendum)
Physical Therapy Session Note  Patient Details  Name: [name] MRN: [id number] Date of Birth: [birth year]  Today's Date: 05/01/2012 Time: 1335-1400 Time Calculation (min): 25 min  Short Term Goals: Week 1:  PT Short Term Goal 1 (Week 1): Pt will perform bed mobility with modified indepdence, using typical movement patterns. PT Short Term Goal 2 (Week 1): Pt will perform basic transfers with supervision. PT Short Term Goal 3 (Week 1): Pt will perform gait x 75'  in controlled environment, with min assist; in home environment x 50' with min assist. PT Short Term Goal 4 (Week 1): Pt will propel w/c using bil LEs x 50' with supervision, for strengthening.   PT Short Term Goal 5 (Week 1): Pt will ascend and descend 5 steps with 2 rails with min assist.     Skilled Therapeutic Interventions/Progress Updates: focus on mobility, standing tolerance.  LTGs downgraded due to pt's slow progress.  D/C planned to SNF.  W/c mobility using bil LEs x 60' with supervision.  Sit> stand x 3 from high mat, close supervision.  Dynamic standing balance during UE sorting task , 1 UE supported on table in front, x 5 minutes.  Discussed and educated on Kegel exercises with pt, for urinary incontinence.  Gait training with RW x 52' with min assist until pt fatigued, requiring mod assist for LOB to L.    Sit> supine on mat to rest before next therapy.  Pt was exhausted and fell asleep in less than 30 seconds.        Therapy Documentation Precautions:  Precautions Precautions: Fall Precaution Comments: NPO Restrictions Weight Bearing Restrictions: No   Pain: Pain Assessment Pain Assessment: No/denies pain Mobility:   Locomotion : Ambulation Ambulation/Gait Assistance: 3: Mod assist      See FIM for current functional status  Therapy/Group: Individual Therapy  Buddy Loeffelholz 05/01/2012, 4:04 PM

## 2012-05-01 NOTE — Plan of Care (Signed)
Problem: RH BOWEL ELIMINATION Goal: RH STG MANAGE BOWEL WITH ASSISTANCE STG Manage Bowel with Assistance.Modified independence  Outcome: Not Progressing Last bowel movement 04-26-12 Goal: RH STG MANAGE BOWEL W/MEDICATION W/ASSISTANCE STG Manage Bowel with Medication with Assistance.Modified independence  Outcome: Not Progressing Last bowel movement 04-26-12  Problem: RH BLADDER ELIMINATION Goal: RH STG MANAGE BLADDER WITH ASSISTANCE STG Manage Bladder With AssistanceModified independence  Outcome: Progressing Stress incontinence

## 2012-05-01 NOTE — H&P (Signed)
[name] is an 78 y.o. female.   Chief Complaint: CVA; severe dysphagia Scheduled for percutaneous gastric tube placement in IR 5/9 Will be in Rehab for weeks  HPI: DM; vulvar Ca; HTN; afib  Past Medical History  Diagnosis Date  . Diabetes mellitus   . Hypertension   . Hyperlipidemia   . Obesity   . Hypothyroidism   . Vulvar cancer, carcinoma 11/03/2011  . Stroke 05/2000    right brain CVA pre H&P 2001  . Atrial fibrillation     Past Surgical History  Procedure Date  . Vulvar lesion removal 2012  . Breast surgery     Lumpectomy  . Dilation and curettage of uterus   . Cystourethroplasty / ureteroneocystostomy   . Radical vulvectomy   . Lymphadenectomy 2007    R inguinal femoral  . Radical vulvectomy 02/27/12  . Vulvar lesion removal 02/27/2012    Procedure: VULVAR LESION;  Surgeon: Jeannette Corpus, MD;  Location: [facility];  Service: Gynecology;  Laterality: N/A;    Family History  Problem Relation Age of Onset  . Diabetes type II     Social History:  reports that she quit smoking about 44 years ago. Her smoking use included Cigarettes. She has a 4.5 pack-year smoking history. She does not have any smokeless tobacco history on file. She reports that she does not drink alcohol or use illicit drugs.  Allergies: No Known Allergies  Medications Prior to Admission  Medication Sig Dispense Refill  . alendronate (FOSAMAX) 70 MG tablet Take 70 mg by mouth every 7 (seven) days. On Sunday. Take with a full glass of water on an empty stomach. Patient states she has not taken this medication in the last 2-3 weeks.      [name] aspirin EC 81 MG tablet Take 81 mg by mouth every morning.      [name] atorvastatin (LIPITOR) 20 MG tablet Take 20 mg by mouth at bedtime.       [name] diltiazem (CARDIZEM CD) 240 MG 24 hr capsule Take 240 mg by mouth daily.       [name] doxazosin (CARDURA) 2 MG tablet Take 0.5 mg by mouth daily.       . indapamide (LOZOL) 1.25 MG tablet Take 1.25 mg by mouth every morning.        . insulin aspart (NOVOLOG) 100 UNIT/ML injection Inject 14-32 Units into the skin 3 (three) times daily before meals. 16 units in the am, 14 at lunch, and 32 at bedtime      . insulin detemir (LEVEMIR) 100 UNIT/ML injection Inject 16-32 Units into the skin 2 (two) times daily. 32 units at breakfast, 16 in the evening      . levothyroxine (SYNTHROID, LEVOTHROID) 100 MCG tablet Take 100 mcg by mouth every morning.       . valsartan (DIOVAN) 320 MG tablet Take 320 mg by mouth every morning.       . Vitamin D, Ergocalciferol, (DRISDOL) 50000 UNITS CAPS Take 50,000 Units by mouth every 7 (seven) days. On Mondays.        Results for orders placed during the hospital encounter of 04/25/12 (from the past 48 hour(s))  GLUCOSE, CAPILLARY     Status: Abnormal   Collection Time   04/29/12  4:11 PM      Component Value Range Comment   Glucose-Capillary 216 (*) 70 - 99 (mg/dL)   GLUCOSE, CAPILLARY     Status: Abnormal   Collection Time   04/29/12  9:51 PM      Component Value Range Comment   Glucose-Capillary 178 (*) 70 - 99 (mg/dL)   GLUCOSE, CAPILLARY     Status: Abnormal   Collection Time   04/29/12 11:49 PM      Component Value Range Comment   Glucose-Capillary 163 (*) 70 - 99 (mg/dL)   GLUCOSE, CAPILLARY     Status: Abnormal   Collection Time   04/30/12  5:16 AM      Component Value Range Comment   Glucose-Capillary 257 (*) 70 - 99 (mg/dL)    Comment 1 Notify RN     GLUCOSE, CAPILLARY     Status: Abnormal   Collection Time   04/30/12  7:59 AM      Component Value Range Comment   Glucose-Capillary 250 (*) 70 - 99 (mg/dL)    Comment 1 Notify RN     GLUCOSE, CAPILLARY     Status: Abnormal   Collection Time   04/30/12 11:24 AM      Component Value Range Comment   Glucose-Capillary 250 (*) 70 - 99 (mg/dL)    Comment 1 Notify RN     GLUCOSE, CAPILLARY     Status: Abnormal   Collection Time   04/30/12  3:48 PM      Component Value Range Comment   Glucose-Capillary 135 (*) 70 - 99 (mg/dL)     GLUCOSE, CAPILLARY     Status: Abnormal   Collection Time   04/30/12  9:23 PM      Component Value Range Comment   Glucose-Capillary 312 (*) 70 - 99 (mg/dL)    Comment 1 Notify RN     GLUCOSE, CAPILLARY     Status: Abnormal   Collection Time   05/01/12  1:11 AM      Component Value Range Comment   Glucose-Capillary 233 (*) 70 - 99 (mg/dL)   GLUCOSE, CAPILLARY     Status: Abnormal   Collection Time   05/01/12  5:27 AM      Component Value Range Comment   Glucose-Capillary 242 (*) 70 - 99 (mg/dL)    Comment 1 Notify RN     GLUCOSE, CAPILLARY     Status: Abnormal   Collection Time   05/01/12  7:36 AM      Component Value Range Comment   Glucose-Capillary 143 (*) 70 - 99 (mg/dL)    No results found.  Review of Systems  Constitutional: Negative for fever.  Gastrointestinal: Negative for nausea and vomiting.  Neurological: Positive for dizziness. Negative for headaches.    Blood pressure 123/70, pulse 92, temperature 94.6 F (34.8 C), temperature source Oral, resp. rate 18, weight 125 lb 10.6 oz (57 kg), SpO2 93.00%. Physical Exam  Constitutional: She is oriented to person, place, and time. She appears well-developed.  Cardiovascular: Normal rate, regular rhythm and normal heart sounds.   No murmur heard. Respiratory: Effort normal and breath sounds normal. She has no wheezes.  GI: Soft. Bowel sounds are normal. There is no tenderness.  Musculoskeletal:       Recent cva; uses w/c; weak  Neurological: She is alert and oriented to person, place, and time.  Skin: Skin is warm.  Psychiatric: She has a normal mood and affect. Her behavior is normal. Judgment and thought content normal.     Assessment/Plan CVA; dysphagia To be in rehab for next weeks Scheduled for percutaneous gastric tube in IR 5/9 Pt aware of procedure benefits and risks and agreeable to proceed. Consent signed  and in chart  Rush Salce A 05/01/2012, 1:31 PM

## 2012-05-01 NOTE — Progress Notes (Signed)
Patient ID: [name], female   DOB: 06-25-1926, 78 y.o.   MRN: [id number] Subjective/Complaints: No c/os TF leaking but pt unaware.  Inc urine especially with cough.  On amitriptyline BID Objective: Vital Signs: Blood pressure 123/70, pulse 92, temperature 94.6 F (34.8 C), temperature source Oral, resp. rate 18, weight 57 kg (125 lb 10.6 oz), SpO2 93.00%. No results found. Results for orders placed during the hospital encounter of 04/25/12 (from the past 72 hour(s))  GLUCOSE, CAPILLARY     Status: Abnormal   Collection Time   04/28/12  8:04 AM      Component Value Range Comment   Glucose-Capillary 268 (*) 70 - 99 (mg/dL)   GLUCOSE, CAPILLARY     Status: Abnormal   Collection Time   04/28/12 11:52 AM      Component Value Range Comment   Glucose-Capillary 197 (*) 70 - 99 (mg/dL)   GLUCOSE, CAPILLARY     Status: Abnormal   Collection Time   04/28/12  4:41 PM      Component Value Range Comment   Glucose-Capillary 201 (*) 70 - 99 (mg/dL)   GLUCOSE, CAPILLARY     Status: Abnormal   Collection Time   04/28/12  7:59 PM      Component Value Range Comment   Glucose-Capillary 258 (*) 70 - 99 (mg/dL)    Comment 1 Notify RN     GLUCOSE, CAPILLARY     Status: Abnormal   Collection Time   04/28/12 11:58 PM      Component Value Range Comment   Glucose-Capillary 171 (*) 70 - 99 (mg/dL)    Comment 1 Notify RN     GLUCOSE, CAPILLARY     Status: Abnormal   Collection Time   04/29/12  4:20 AM      Component Value Range Comment   Glucose-Capillary 215 (*) 70 - 99 (mg/dL)    Comment 1 Notify RN     GLUCOSE, CAPILLARY     Status: Abnormal   Collection Time   04/29/12  7:17 AM      Component Value Range Comment   Glucose-Capillary 192 (*) 70 - 99 (mg/dL)    Comment 1 Notify RN     GLUCOSE, CAPILLARY     Status: Abnormal   Collection Time   04/29/12 11:17 AM      Component Value Range Comment   Glucose-Capillary 274 (*) 70 - 99 (mg/dL)   GLUCOSE, CAPILLARY     Status: Abnormal   Collection Time   04/29/12  4:11 PM      Component Value Range Comment   Glucose-Capillary 216 (*) 70 - 99 (mg/dL)   GLUCOSE, CAPILLARY     Status: Abnormal   Collection Time   04/29/12  9:51 PM      Component Value Range Comment   Glucose-Capillary 178 (*) 70 - 99 (mg/dL)   GLUCOSE, CAPILLARY     Status: Abnormal   Collection Time   04/29/12 11:49 PM      Component Value Range Comment   Glucose-Capillary 163 (*) 70 - 99 (mg/dL)   GLUCOSE, CAPILLARY     Status: Abnormal   Collection Time   04/30/12  5:16 AM      Component Value Range Comment   Glucose-Capillary 257 (*) 70 - 99 (mg/dL)    Comment 1 Notify RN     GLUCOSE, CAPILLARY     Status: Abnormal   Collection Time   04/30/12  7:59 AM  Component Value Range Comment   Glucose-Capillary 250 (*) 70 - 99 (mg/dL)    Comment 1 Notify RN     GLUCOSE, CAPILLARY     Status: Abnormal   Collection Time   04/30/12 11:24 AM      Component Value Range Comment   Glucose-Capillary 250 (*) 70 - 99 (mg/dL)    Comment 1 Notify RN     GLUCOSE, CAPILLARY     Status: Abnormal   Collection Time   04/30/12  3:48 PM      Component Value Range Comment   Glucose-Capillary 135 (*) 70 - 99 (mg/dL)   GLUCOSE, CAPILLARY     Status: Abnormal   Collection Time   04/30/12  9:23 PM      Component Value Range Comment   Glucose-Capillary 312 (*) 70 - 99 (mg/dL)    Comment 1 Notify RN     GLUCOSE, CAPILLARY     Status: Abnormal   Collection Time   05/01/12  1:11 AM      Component Value Range Comment   Glucose-Capillary 233 (*) 70 - 99 (mg/dL)   GLUCOSE, CAPILLARY     Status: Abnormal   Collection Time   05/01/12  5:27 AM      Component Value Range Comment   Glucose-Capillary 242 (*) 70 - 99 (mg/dL)    Comment 1 Notify RN        HEENT: normal Cardio: RRR Resp: CTA B/L GI: BS positive Extremity:  No Edema Skin:   Bruise RLQ and LLQ at lovenox injection sites Neuro: Alert/Oriented, Dysarthric and Other truncal ataxia Musc/Skel:  Normal   Assessment/Plan: 1. Functional  deficits secondary to Posterior circulation infarct with ataxia and severe dysphagia which require 3+ hours per day of interdisciplinary therapy in a comprehensive inpatient rehab setting. Physiatrist is providing close team supervision and 24 hour management of active medical problems listed below. Physiatrist and rehab team continue to assess barriers to discharge/monitor patient progress toward functional and medical goals. Team conference FIM: FIM - Bathing Bathing Steps Patient Completed: Chest;Right Arm;Left Arm;Abdomen;Front perineal area;Buttocks;Right upper leg;Left upper leg;Right lower leg (including foot);Left lower leg (including foot) Bathing: 5: Supervision: Safety issues/verbal cues  FIM - Upper Body Dressing/Undressing Upper body dressing/undressing steps patient completed: Thread/unthread right bra strap;Thread/unthread left bra strap;Hook/unhook bra;Thread/unthread left sleeve of front closure shirt/dress;Button/unbutton shirt;Pull shirt around back of front closure shirt/dress;Thread/unthread right sleeve of front closure shirt/dress Upper body dressing/undressing: 5: Supervision: Safety issues/verbal cues FIM - Lower Body Dressing/Undressing Lower body dressing/undressing steps patient completed: Thread/unthread right underwear leg;Thread/unthread left underwear leg;Pull underwear up/down;Thread/unthread right pants leg;Thread/unthread left pants leg;Pull pants up/down;Don/Doff right sock;Don/Doff left sock;Don/Doff right shoe;Don/Doff left shoe;Fasten/unfasten right shoe;Fasten/unfasten left shoe Lower body dressing/undressing: 4: Steadying Assist  FIM - Toileting Toileting steps completed by patient: Adjust clothing prior to toileting;Performs perineal hygiene;Adjust clothing after toileting Toileting Assistive Devices: Grab bar or rail for support Toileting: 4: Steadying assist  FIM - Diplomatic Services operational officer Devices: Grab bars Toilet Transfers: 4-To  toilet/BSC: Min A (steadying Pt. > 75%);4-From toilet/BSC: Min A (steadying Pt. > 75%)  FIM - Bed/Chair Transfer Bed/Chair Transfer Assistive Devices: HOB elevated Bed/Chair Transfer: 6: More than reasonable amt of time;6: Supine > Sit: No assist;4: Bed > Chair or W/C: Min A (steadying Pt. > 75%)  FIM - Locomotion: Wheelchair Distance: 60 Locomotion: Wheelchair: 2: Travels 50 - 149 ft with supervision, cueing or coaxing FIM - Locomotion: Ambulation Locomotion: Ambulation Assistive Devices: Designer, industrial/product Ambulation/Gait Assistance:  3: Mod assist Locomotion: Ambulation: 1: Travels less than 50 ft with moderate assistance (Pt: 50 - 74%)  Comprehension Comprehension Mode: Auditory Comprehension: 7-Follows complex conversation/direction: With no assist  Expression Expression Mode: Verbal Expression: 5-Expresses basic needs/ideas: With extra time/assistive device  Social Interaction Social Interaction: 6-Interacts appropriately with others with medication or extra time (anti-anxiety, antidepressant).  Problem Solving Problem Solving: 3-Solves basic 50 - 74% of the time/requires cueing 25 - 49% of the time  Memory Memory: 4-Recognizes or recalls 75 - 89% of the time/requires cueing 10 - 24% of the time  2. Anticoagulation/DVT prophylaxis with Pharmaceutical: Lovenox 3. Pain Management: acetominophen 4.  DM-uncontrolled  Slowly titrating lantus, uses levimir at home, ?repeat MBS an start po feeds soon 5.  Spastic bladder plus stress incont.  PT to teach Kegel's  LOS (Days) 6 A FACE TO FACE EVALUATION WAS PERFORMED  [name] 05/01/2012, 7:16 AM

## 2012-05-01 NOTE — Progress Notes (Signed)
Inpatient Diabetes Program Recommendations  AACE/ADA: New Consensus Statement on Inpatient Glycemic Control (2009)  Target Ranges:  Prepandial:   less than 140 mg/dL      Peak postprandial:   less than 180 mg/dL (1-2 hours)      Critically ill patients:  140 - 180 mg/dL   Reason for Visit: Results for SKYLIE, HIOTT (MRN [id number]) as of 05/01/2012 14:17  Ref. Range 04/30/2012 15:48 04/30/2012 21:23 05/01/2012 01:11 05/01/2012 05:27 05/01/2012 07:36  Glucose-Capillary Latest Range: 70-99 mg/dL 782 (H) 956 (H) 213 (H) 242 (H) 143 (H)    Inpatient Diabetes Program Recommendations Insulin - Basal: . Insulin - Meal Coverage: Note patient is receiving tube feeds 20 hours/day (they are held during therapy).  Consider adding Novolog tube feed coverage 3 units q 4 hours (only to be given if tube feeds are running)  Note: Will follow.

## 2012-05-01 NOTE — Progress Notes (Signed)
Social Work Patient ID: [name], female   DOB: 12/08/1926, 78 y.o.   MRN: [id number] Met with pt and husband who was here to observe in therapies.  Pt needs many rest breaks during her therapies. Aware consult for a PEG was made and possible being done soon.  Husband wants to try to provide the care pt requires. Continue to work on discharge

## 2012-05-01 NOTE — Progress Notes (Signed)
Occupational Therapy Session Note  Patient Details  Name: ODESSIE POLZIN MRN: [id number] Date of Birth: 1926/02/24  Today's Date: 05/01/2012 Time: 7829-5621 Time Calculation (min): 42 min  Short Term Goals: Week 1:  OT Short Term Goal 1 (Week 1): Pt will transfer to toilet with supervision with or without RW. OT Short Term Goal 1 - Progress (Week 1): Progressing toward goal OT Short Term Goal 2 (Week 1): Pt will dress LB with supervision. OT Short Term Goal 2 - Progress (Week 1): Progressing toward goal OT Short Term Goal 3 (Week 1): Pt will perform simple meal prep with supervision. OT Short Term Goal 3 - Progress (Week 1): Progressing toward goal      Skilled Therapeutic Interventions/Progress Updates:  Pt seen for BADL retraining of dressing tasks with a focus on standing balance and activity tolerance.  Pt is more fatigued today and was only able to tolerate standing for 1-2 minutes at a time.  Pt demonstrated supervision with static standing, but min assist with dynamic.  She stood at sink to groom. No pushing to the left in standing today.  Needs cues to push up from wheelchair.     Therapy Documentation Precautions:  Precautions Precautions: Fall Precaution Comments: NPO Restrictions Weight Bearing Restrictions: No Pain: Pain Assessment Pain Assessment: No/denies pain Pain Score: 0-No pain See FIM for current functional status  Therapy/Group: Individual Therapy  Jakeob Tullis 05/01/2012, 11:09 AM

## 2012-05-01 NOTE — Progress Notes (Signed)
Speech Language Pathology Daily Session Note  Patient Details  Name: [name] MRN: [id number] Date of Birth: [birth year]  Today's Date: 05/01/2012 Time: 0930-1025 Time Calculation (min): 55 min  Short Term Goals: Week 1:  See BSE  Skilled Therapeutic Interventions: Session focused on skilled treatment of dysphagia with SLP facilitating session with NMES in 3b placement at 7 mA to address UES function and mod assist semantic cues to produce 5-8 hard effortful swallows per 1/2 teaspoon bite of puree.  Patient demonstrated consistent throat clears prior to each extra swallow.  Patient consumed 4oz of applesauce in 55 minutes.  Patient verbalized that she recognized how effortful it was for her to eat right now.  Daily Session Precautions/Restrictions    FIM:  Comprehension Comprehension Mode: Auditory Comprehension: 6-Follows complex conversation/direction: With extra time/assistive device Expression Expression Mode: Verbal Expression: 6-Expresses complex ideas: With extra time/assistive device Social Interaction Social Interaction: 6-Interacts appropriately with others with medication or extra time (anti-anxiety, antidepressant). Problem Solving Problem Solving: 5-Solves basic problems: With no assist Memory Memory: 5-Recognizes or recalls 90% of the time/requires cueing < 10% of the time General    Pain Pain Assessment Pain Assessment: No/denies pain Pain Score: 0-No pain  Therapy/Group: Individual Therapy  [name], CCC-SLP [phone]  [name] 05/01/2012, 10:58 AM

## 2012-05-01 NOTE — Progress Notes (Signed)
Physical Therapy Note  Patient Details  Name: KORAIMA ALBERTSEN MRN: [id number] Date of Birth: 11/15/1926 Today's Date: 05/01/2012  14:30-15:30 individual therapy pt denied pain.  Performed supine bridging, steps with 2 rails progressing to 1 rail with min assist alternating pattern with vc to hold rail until completley off last step. Pt was taught keigal exercises per MD order and educated to perform scheduled toileting to decrease urgency. Stand pivot transfers mod assist with vc for sequence for sit to stand to aid balance, performed reaching and rotation for balance without UE support max assist with decreased trunk weight shift.  Julian Reil 05/01/2012, 5:07 PM

## 2012-05-02 DIAGNOSIS — Z5189 Encounter for other specified aftercare: Secondary | ICD-10-CM

## 2012-05-02 DIAGNOSIS — I69991 Dysphagia following unspecified cerebrovascular disease: Secondary | ICD-10-CM

## 2012-05-02 DIAGNOSIS — I69993 Ataxia following unspecified cerebrovascular disease: Secondary | ICD-10-CM

## 2012-05-02 LAB — CBC
Hemoglobin: 13 g/dL (ref 12.0–15.0)
MCH: 31.6 pg (ref 26.0–34.0)
MCV: 95.6 fL (ref 78.0–100.0)
RBC: 4.11 MIL/uL (ref 3.87–5.11)
WBC: 8.8 10*3/uL (ref 4.0–10.5)

## 2012-05-02 LAB — PROTIME-INR
INR: 0.97 (ref 0.00–1.49)
Prothrombin Time: 13.1 seconds (ref 11.6–15.2)

## 2012-05-02 LAB — GLUCOSE, CAPILLARY
Glucose-Capillary: 180 mg/dL — ABNORMAL HIGH (ref 70–99)
Glucose-Capillary: 250 mg/dL — ABNORMAL HIGH (ref 70–99)
Glucose-Capillary: 264 mg/dL — ABNORMAL HIGH (ref 70–99)

## 2012-05-02 LAB — BASIC METABOLIC PANEL
CO2: 29 mEq/L (ref 19–32)
Calcium: 9.6 mg/dL (ref 8.4–10.5)
Chloride: 100 mEq/L (ref 96–112)
Creatinine, Ser: 1.33 mg/dL — ABNORMAL HIGH (ref 0.50–1.10)
Glucose, Bld: 188 mg/dL — ABNORMAL HIGH (ref 70–99)
Sodium: 140 mEq/L (ref 135–145)

## 2012-05-02 MED ORDER — CEFAZOLIN SODIUM 1-5 GM-% IV SOLN
1.0000 g | INTRAVENOUS | Status: AC
  Administered 2012-05-03: 1 g via INTRAVENOUS
  Filled 2012-05-02: qty 50

## 2012-05-02 NOTE — Progress Notes (Signed)
Patient ID: [name], female   DOB: 1926/01/14, 78 y.o.   MRN: [id number] Subjective/Complaints: No c/os TF leaking but pt unaware.  Inc urine especially with cough.  On amitriptyline BID Objective: Vital Signs: Blood pressure 129/82, pulse 142, temperature 97.8 F (36.6 C), temperature source Oral, resp. rate 18, weight 57.1 kg (125 lb 14.1 oz), SpO2 96.00%. No results found. Results for orders placed during the hospital encounter of 04/25/12 (from the past 72 hour(s))  GLUCOSE, CAPILLARY     Status: Abnormal   Collection Time   04/29/12 11:17 AM      Component Value Range Comment   Glucose-Capillary 274 (*) 70 - 99 (mg/dL)   GLUCOSE, CAPILLARY     Status: Abnormal   Collection Time   04/29/12  4:11 PM      Component Value Range Comment   Glucose-Capillary 216 (*) 70 - 99 (mg/dL)   GLUCOSE, CAPILLARY     Status: Abnormal   Collection Time   04/29/12  9:51 PM      Component Value Range Comment   Glucose-Capillary 178 (*) 70 - 99 (mg/dL)   GLUCOSE, CAPILLARY     Status: Abnormal   Collection Time   04/29/12 11:49 PM      Component Value Range Comment   Glucose-Capillary 163 (*) 70 - 99 (mg/dL)   GLUCOSE, CAPILLARY     Status: Abnormal   Collection Time   04/30/12  5:16 AM      Component Value Range Comment   Glucose-Capillary 257 (*) 70 - 99 (mg/dL)    Comment 1 Notify RN     GLUCOSE, CAPILLARY     Status: Abnormal   Collection Time   04/30/12  7:59 AM      Component Value Range Comment   Glucose-Capillary 250 (*) 70 - 99 (mg/dL)    Comment 1 Notify RN     GLUCOSE, CAPILLARY     Status: Abnormal   Collection Time   04/30/12 11:24 AM      Component Value Range Comment   Glucose-Capillary 250 (*) 70 - 99 (mg/dL)    Comment 1 Notify RN     GLUCOSE, CAPILLARY     Status: Abnormal   Collection Time   04/30/12  3:48 PM      Component Value Range Comment   Glucose-Capillary 135 (*) 70 - 99 (mg/dL)   GLUCOSE, CAPILLARY     Status: Abnormal   Collection Time   04/30/12  9:23 PM   Component Value Range Comment   Glucose-Capillary 312 (*) 70 - 99 (mg/dL)    Comment 1 Notify RN     GLUCOSE, CAPILLARY     Status: Abnormal   Collection Time   05/01/12  1:11 AM      Component Value Range Comment   Glucose-Capillary 233 (*) 70 - 99 (mg/dL)   GLUCOSE, CAPILLARY     Status: Abnormal   Collection Time   05/01/12  5:27 AM      Component Value Range Comment   Glucose-Capillary 242 (*) 70 - 99 (mg/dL)    Comment 1 Notify RN     GLUCOSE, CAPILLARY     Status: Abnormal   Collection Time   05/01/12  7:36 AM      Component Value Range Comment   Glucose-Capillary 143 (*) 70 - 99 (mg/dL)   GLUCOSE, CAPILLARY     Status: Abnormal   Collection Time   05/01/12 12:28 PM      Component Value  Range Comment   Glucose-Capillary 295 (*) 70 - 99 (mg/dL)   GLUCOSE, CAPILLARY     Status: Abnormal   Collection Time   05/01/12  4:15 PM      Component Value Range Comment   Glucose-Capillary 233 (*) 70 - 99 (mg/dL)   GLUCOSE, CAPILLARY     Status: Abnormal   Collection Time   05/01/12  8:10 PM      Component Value Range Comment   Glucose-Capillary 236 (*) 70 - 99 (mg/dL)    Comment 1 Notify RN     GLUCOSE, CAPILLARY     Status: Abnormal   Collection Time   05/02/12 12:52 AM      Component Value Range Comment   Glucose-Capillary 250 (*) 70 - 99 (mg/dL)    Comment 1 Notify RN     GLUCOSE, CAPILLARY     Status: Abnormal   Collection Time   05/02/12  3:40 AM      Component Value Range Comment   Glucose-Capillary 169 (*) 70 - 99 (mg/dL)   CBC     Status: Normal   Collection Time   05/02/12  5:30 AM      Component Value Range Comment   WBC 8.8  4.0 - 10.5 (K/uL)    RBC 4.11  3.87 - 5.11 (MIL/uL)    Hemoglobin 13.0  12.0 - 15.0 (g/dL)    HCT 95.6  21.3 - 08.6 (%)    MCV 95.6  78.0 - 100.0 (fL)    MCH 31.6  26.0 - 34.0 (pg)    MCHC 33.1  30.0 - 36.0 (g/dL)    RDW 57.8  46.9 - 62.9 (%)    Platelets 195  150 - 400 (K/uL)   BASIC METABOLIC PANEL     Status: Abnormal   Collection Time   05/02/12   5:30 AM      Component Value Range Comment   Sodium 140  135 - 145 (mEq/L)    Potassium 4.3  3.5 - 5.1 (mEq/L)    Chloride 100  96 - 112 (mEq/L)    CO2 29  19 - 32 (mEq/L)    Glucose, Bld 188 (*) 70 - 99 (mg/dL)    BUN 43 (*) 6 - 23 (mg/dL)    Creatinine, Ser 5.28 (*) 0.50 - 1.10 (mg/dL)    Calcium 9.6  8.4 - 10.5 (mg/dL)    GFR calc non Af Amer 36 (*) >90 (mL/min)    GFR calc Af Amer 42 (*) >90 (mL/min)   PROTIME-INR     Status: Normal   Collection Time   05/02/12  5:30 AM      Component Value Range Comment   Prothrombin Time 13.1  11.6 - 15.2 (seconds)    INR 0.97  0.00 - 1.49    APTT     Status: Normal   Collection Time   05/02/12  5:30 AM      Component Value Range Comment   aPTT 28  24 - 37 (seconds)      HEENT: normal Cardio: RRR Resp: CTA B/L GI: BS positive Extremity:  No Edema Skin:   Bruise RLQ and LLQ at lovenox injection sites Neuro: Alert/Oriented, Dysarthric and Other truncal ataxia Musc/Skel:  Normal   Assessment/Plan: 1. 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 Physiatrist is providing close team supervision and 24 hour management of active  medical problems listed below. Physiatrist and rehab team continue to assess barriers to discharge/monitor patient progress toward functional and medical goals. Team conference FIM: FIM - Bathing Bathing Steps Patient Completed: Chest;Right Arm;Left Arm;Abdomen;Front perineal area;Buttocks;Right upper leg;Left upper leg;Right lower leg (including foot);Left lower leg (including foot) Bathing: 5: Supervision: Safety issues/verbal cues  FIM - Upper Body Dressing/Undressing Upper body dressing/undressing steps patient completed: Thread/unthread right bra strap;Thread/unthread left bra strap;Hook/unhook bra;Thread/unthread right sleeve of pullover  shirt/dresss;Thread/unthread left sleeve of pullover shirt/dress;Put head through opening of pull over shirt/dress;Pull shirt over trunk Upper body dressing/undressing: 5: Supervision: Safety issues/verbal cues FIM - Lower Body Dressing/Undressing Lower body dressing/undressing steps patient completed: Thread/unthread right underwear leg;Thread/unthread left underwear leg;Thread/unthread right pants leg;Pull underwear up/down;Thread/unthread left pants leg;Pull pants up/down;Don/Doff left sock;Don/Doff right sock;Don/Doff right shoe;Don/Doff left shoe;Fasten/unfasten right shoe;Fasten/unfasten left shoe Lower body dressing/undressing: 4: Steadying Assist  FIM - Toileting Toileting steps completed by patient: Adjust clothing prior to toileting;Performs perineal hygiene;Adjust clothing after toileting Toileting Assistive Devices: Grab bar or rail for support Toileting: 4: Steadying assist  FIM - Diplomatic Services operational officer Devices: Grab bars Toilet Transfers: 4-To toilet/BSC: Min A (steadying Pt. > 75%);4-From toilet/BSC: Min A (steadying Pt. > 75%)  FIM - Bed/Chair Transfer Bed/Chair Transfer Assistive Devices: HOB elevated Bed/Chair Transfer: 5: Supine > Sit: Supervision (verbal cues/safety issues);5: Sit > Supine: Supervision (verbal cues/safety issues);4: Bed > Chair or W/C: Min A (steadying Pt. > 75%);4: Chair or W/C > Bed: Min A (steadying Pt. > 75%)  FIM - Locomotion: Wheelchair Distance: 60 Locomotion: Wheelchair: 1: Travels less than 50 ft with supervision, cueing or coaxing FIM - Locomotion: Ambulation Locomotion: Ambulation Assistive Devices: Designer, industrial/product Ambulation/Gait Assistance: 3: Mod assist Locomotion: Ambulation: 2: Travels 50 - 149 ft with moderate assistance (Pt: 50 - 74%)  Comprehension Comprehension Mode: Auditory Comprehension: 6-Follows complex conversation/direction: With extra time/assistive device  Expression Expression Mode:  Verbal Expression: 6-Expresses complex ideas: With extra time/assistive device  Social Interaction Social Interaction: 6-Interacts appropriately with others with medication or extra time (anti-anxiety, antidepressant).  Problem Solving Problem Solving: 6-Solves complex problems: With extra time  Memory Memory: 6-More than reasonable amt of time  2. Anticoagulation/DVT prophylaxis with Pharmaceutical: Lovenox 3. Pain Management: acetominophen 4.  DM-uncontrolled  Slowly titrating lantus, uses levimir at home, Will need PEG today hold lovenox 5.  Spastic bladder plus stress incont.  PT to teach Kegel's  LOS (Days) 7 A FACE TO FACE EVALUATION WAS PERFORMED  [name] E 05/02/2012, 7:33 AM

## 2012-05-02 NOTE — Progress Notes (Signed)
Physical Therapy Note  Patient Details  Name: [name] MRN: [id number] Date of Birth: 29-Sep-1926 Today's Date: 05/02/2012  14:45-15:30 individual therapy pt denied pain.  Pt inially declined session, but had been incontinent of urine. Performed gait with rw min to mod assist with vc for walker placement and safety with turning. Pt stated she had been wet for 1 hour and had not called to get assistance with being changed. Pt was educated on skin care and use of call bell and this was discussed with CNA. Performed side steps to left with pt's hands on therapist shoulders with mod assist.    Julian Reil 05/02/2012, 4:31 PM

## 2012-05-02 NOTE — Plan of Care (Signed)
Problem: RH BLADDER ELIMINATION Goal: RH STG MANAGE BLADDER WITH ASSISTANCE STG Manage Bladder With minimum assistance  Outcome: Not Progressing Incontinent of urine on 05/02/12

## 2012-05-02 NOTE — Progress Notes (Signed)
Occupational Therapy Session Note  Patient Details  Name: [name] MRN: [id number] Date of Birth: 04/27/1926  Today's Date: 05/02/2012 Visit 1: Time: 0805-0900 Time Calculation (min): 55 min Visit 2:  Time: 1130-1155 Time Calculation: 25 minutes  Short Term Goals: Week 1:  OT Short Term Goal 1 (Week 1): Pt will transfer to toilet with supervision with or without RW. OT Short Term Goal 1 - Progress (Week 1): Progressing toward goal OT Short Term Goal 2 (Week 1): Pt will dress LB with supervision. OT Short Term Goal 2 - Progress (Week 1): Progressing toward goal OT Short Term Goal 3 (Week 1): Pt will perform simple meal prep with supervision. OT Short Term Goal 3 - Progress (Week 1): Progressing toward goal      Skilled Therapeutic Interventions/Progress Updates:  Visit 1: Pt seen for BADL retraining focusing on standing balance, transfers, activity tolerance. Pt did quite well despite the fact she was very uncomfortable from her panda tube. Pt is waiting on a gastric tube placement.  Pt also worked on UE/LE AROM and kegal exercises from EOB. Visit 2: Pt worked on [facility] transfers with stand pivot with min assist to toilet with steady assist. Standing balance activities with walker for stepping over obstacle forward and back and side to side to simulate shower stall transfer.  Pt uses as shower stall at home.  Pt needed frequent rest breaks and easily distracted by panda tube.     Therapy Documentation Precautions:  Precautions Precautions: Fall Precaution Comments: NPO Restrictions Weight Bearing Restrictions: No   Pain: Pain Assessment Pain Assessment: No/denies pain  See FIM for current functional status  Therapy/Group: Individual Therapy  Kjersten Ormiston 05/02/2012, 10:49 AM

## 2012-05-02 NOTE — Progress Notes (Signed)
Speech Language Pathology Daily Session Note & Weekly Progress Update  Patient Details  Name: [name] MRN: [id number] Date of Birth: 1926-10-05  Today's Date: 05/02/2012 Time: 0930-1015 Time Calculation (min): 45 min  Short Term Goals: Week 1: Goal #3: Patient will demonstrate improved ability to consume ice chips with dereased overt s/s of aspiration to determine readiness for repeat objective swallow evaluation.  Goal #4: Patient will complete hard effortful swallows 30 times with minimal assist seamtnic cues  Goal #5 Patient will complete oral care with supervision semantic cues  Skilled Therapeutic Interventions: Session focused on skilled treatment of dysphagia.  SLP facilitated session with conversation regarding therapy plan and duration; patient verbally recognized need for more long-term therapy and that she agreed to a PEG tube with the MD/PA.  SLP facilitated session with NMES 3b placement with 2 electrodes at 7mA and moderate assist semantic cues to utilize slow pacing with self feeding and complete at least 5 hard effortful swallows prior to self feeding another bite.  Patient reported being very fatigued and session was ended early as a result.   Daily Session Precautions/Restrictions  Restrictions Weight Bearing Restrictions: No FIM:  Comprehension Comprehension Mode: Auditory Comprehension: 6-Follows complex conversation/direction: With extra time/assistive device Expression Expression Mode: Verbal Expression: 6-Expresses complex ideas: With extra time/assistive device Social Interaction Social Interaction: 6-Interacts appropriately with others with medication or extra time (anti-anxiety, antidepressant). Problem Solving Problem Solving: 6-Solves complex problems: With extra time Memory Memory: 6-More than reasonable amt of time General  Amount of Missed SLP Time (min): 15 Minutes Pain Pain Assessment Pain Assessment: No/denies pain Pain Score: 0-No  pain  Therapy/Group: Individual Therapy   Speech Language Pathology Weekly Progress Note  Patient Details  Name: [name] MRN: [id number] Date of Birth: 13-Sep-1926  Today's Date: 05/02/2012  Short Term Goals: Week 1 Goal #3: Patient will demonstrate improved ability to consume ice chips with dereased overt s/s of aspiration to determine readiness for repeat objective swallow evaluation. (Progressing) Goal #4: Patient will complete hard effortful swallows 30 times with minimal assist seamtnic cues (Progressing) Goal #5 Patient will complete oral care with supervision semantic cues (Met)  Week 2 Patient will demonstrate improved ability to consume ice chips with dereased overt s/s of aspiration to determine readiness for repeat objective swallow evaluation. Patient will consume trials of puree with 5 hard effortful swallows and no overt s/s of penetration or aspiration.  Patient will complete oral care with modified independence.  Weekly Progress Updates: This week the patient met 1 out of 3 short term goals by completing oral care with supervision cues for thoroughness.  Due to the severity of this patient's dysphagia p.o. Nutrition is not recommended at this time and this SLP suspects that long-term therapy is going to be needed prior to safely initiating a p.o. Diet.  The patient, MD and PA are aware.  While the patient has made minimal gains in her swallow function this week it is recommended to continue the goals into next week and continue to address her dysphagia during skilled SLP therapy.    Daily Session Precautions/Restrictions  Restrictions Weight Bearing Restrictions: No NPO  [name]., CCC-SLP [phone]  [name] 05/02/2012, 3:43 PM

## 2012-05-02 NOTE — Progress Notes (Signed)
Patient ID: [name], female   DOB: 01/28/1926, 78 y.o.   MRN: [id number]   Pt had been scheduled for percutaneous gastric tube placement in IR today.  Orders written for barium via panda at 8pm last night. No barium was given  Now rescheduled til 5/10 per Dr Lowella Dandy  Pt and RN aware See new orders

## 2012-05-03 ENCOUNTER — Inpatient Hospital Stay (HOSPITAL_COMMUNITY): Payer: Medicare Other

## 2012-05-03 LAB — GLUCOSE, CAPILLARY
Glucose-Capillary: 112 mg/dL — ABNORMAL HIGH (ref 70–99)
Glucose-Capillary: 177 mg/dL — ABNORMAL HIGH (ref 70–99)

## 2012-05-03 MED ORDER — HYDROCODONE-ACETAMINOPHEN 5-325 MG PO TABS
1.0000 | ORAL_TABLET | Freq: Four times a day (QID) | ORAL | Status: DC | PRN
  Administered 2012-05-03 – 2012-05-06 (×6): 1 via ORAL
  Filled 2012-05-03 (×8): qty 1

## 2012-05-03 MED ORDER — FENTANYL CITRATE 0.05 MG/ML IJ SOLN
INTRAMUSCULAR | Status: DC | PRN
  Administered 2012-05-03 (×2): 25 ug via INTRAVENOUS
  Administered 2012-05-03: 50 ug via INTRAVENOUS

## 2012-05-03 MED ORDER — MIDAZOLAM HCL 5 MG/5ML IJ SOLN
INTRAMUSCULAR | Status: DC | PRN
  Administered 2012-05-03: 0.5 mg via INTRAVENOUS
  Administered 2012-05-03: 1 mg via INTRAVENOUS
  Administered 2012-05-03: 0.5 mg via INTRAVENOUS

## 2012-05-03 MED ORDER — INSULIN GLARGINE 100 UNIT/ML ~~LOC~~ SOLN
15.0000 [IU] | Freq: Two times a day (BID) | SUBCUTANEOUS | Status: DC
  Administered 2012-05-03 – 2012-05-04 (×3): 15 [IU] via SUBCUTANEOUS

## 2012-05-03 MED ORDER — IOHEXOL 300 MG/ML  SOLN
50.0000 mL | Freq: Once | INTRAMUSCULAR | Status: AC | PRN
  Administered 2012-05-03: 10 mL

## 2012-05-03 NOTE — Patient Care Conference (Signed)
Inpatient RehabilitationTeam Conference Note Date: 05/01/2012   Time: 11:15 AM   Patient Name: [name]      Medical Record Number: [id number]  Date of Birth: [birth year] Sex: Female         Room/Bed: 4032/4032-01 Payor Info: Payor: MEDICARE  Plan: MEDICARE PART A AND B  Product Type: *No Product type*     Admitting Diagnosis: lt cva  Admit Date/Time:  04/25/2012  4:46 PM Admission Comments: No comment available   Primary Diagnosis:  Stroke Principal Problem: Stroke  Patient Active Problem List  Diagnoses Date Noted  . Stroke 04/30/2012  . CVA (cerebral infarction) 04/23/2012  . Acute respiratory failure with hypoxia 04/22/2012  . Leukocytosis 04/22/2012  . Atrial fibrillation with RVR 04/21/2012  . Diabetes mellitus 04/21/2012  . Hypertension 04/21/2012  . Hyperlipidemia 04/21/2012  . Obesity (BMI 30-39.9) 04/21/2012  . Hypothyroidism 04/21/2012  . H/O: CVA (cardiovascular accident) 04/21/2012  . Vulvar cancer, carcinoma 11/03/2011    Expected Discharge Date: Expected Discharge Date: 05/09/12  Team Members Present: Physician: Dr. Claudette Laws Case Manager Present: Lutricia Horsfall, RN Social Worker Present: Dossie Der, LCSW Nurse Present: Laural Roes, RN PT Present: Edman Circle, Lillie Columbia, PT OT Present: Bretta Bang, Verlene Mayer, OT PPS Coordinator: Tora Duck, RN    Current Status/Progress Goal Weekly Team Focus  Medical   poor endurance, urinary stress incontinence, poor management of oral secretions  establish longer term enteral feeding route  eval 4 gastrostomy placement   Bowel/Bladder   continent bowel/stress incontinence of bladder  continent bowel/bladder  toilet q2h   Swallow/Nutrition/ Hydration   NPO with 6-8 swallows per 1/2 tespoon of puree with consistent thraot clears following each swallow  least restrictive p.o.  NMES with dysphagia treatment   ADL's   steady assist due to decreased balance, new behavior of impulsivity  with movement; min assist with transfers  mod I except for supervision with tub transfers  ADL retraining, standing balance activities, left attention activities   Mobility   min assist transfer; mod assist gait x 25'; min assst 3 steps   supervision transfer and gait x 150' min assist car and 4 steps; supervision w/c x 75'   strengthening, mobility training, balance   Communication             Safety/Cognition/ Behavioral Observations            Pain   no complaints of pain  n/a  n/a   Skin   redness to panda insertion site in right nare  no new breakdown  monitor      *See Interdisciplinary Assessment and Plan and progress notes for long and short-term goals  Barriers to Discharge: see above    Possible Resolutions to Barriers:  educate family on gastrostomy usage versus SNF placement    Discharge Planning/Teaching Needs:  Home with husband who can provide minimal assist walks with a cane himself      Team Discussion:  Progress is slow. Leans to left. Has stress incontinence. Continue with tube feedings. May need PEG placement prior to d/c.  Amitriptyline helping decrease secretions.  Concern for husband's abililty to provide care.  Revisions to Treatment Plan:  none   Continued Need for Acute Rehabilitation Level of Care: The patient requires daily medical management by a physician with specialized training in physical medicine and rehabilitation for the following conditions: Daily direction of a multidisciplinary physical rehabilitation program to ensure safe treatment while eliciting the highest outcome that is of  practical value to the patient.: Yes Daily medical management of patient stability for increased activity during participation in an intensive rehabilitation regime.: Yes Daily analysis of laboratory values and/or radiology reports with any subsequent need for medication adjustment of medical intervention for : Neurological problems  [name] Dare 05/03/2012, 2:49 PM

## 2012-05-03 NOTE — Procedures (Signed)
Successful 29fr pull through gtube No comp Stable Full report in pacs

## 2012-05-03 NOTE — Progress Notes (Signed)
Physical Therapy Weekly Progress Note  Patient Details  Name: LARETTA PYATT MRN: [id number] Date of Birth: 1926-06-07  Today's Date: 05/03/2012 Time:0905-0945 Time Calculation (min): 40 min  Patient has met 0 of 6 long term goals.  Short term goals not set due to estimated length of stay.    Present status: min assist bed mobility, mod assist basic transfers, gait x 35' with min assist until turning L and fatigued, then max assist for LOB to L,  Pt has sudden LOB L due to overshifting L, which she is unable to recover.    Patient continues to demonstrate the following deficits: weakness, balance, postural control  and therefore will continue to benefit from skilled PT intervention to enhance overall performance with activity tolerance, balance, postural control, ability to compensate for deficits and functional use of  right lower extremity and left lower extremity.  See Patient's Care Plan for progression toward long term goals.  Patient  progressing slowly toward long term goals.  See goal revision.  Skilled Therapeutic Interventions/Progress Updates:  Treatment today focused on functional mobility, neuro re-ed,  strengthening.    Pt c/o fatigue, lack of sleep, and feeling tired at start of session.    Seated therex prior to gait- 5 x 1 each knee extension with isometric contraction at end range, 10 x 1 alternating hip flexion  Gait training with RW x 35' with min assist, VCs for forward gaze, upright posture.  While turning L to return to w/c, pt had LOB L requiring max assist to prevent fall, and second person to bring w/c up behind her.    Kinetron in sitting at 40 cm/sec x 2 minutes for general strengthening and wt shifting.  In standing, x 30 seconds x 3, at 20 cm/sec focusing on alternating wt shift.  Manual cues for full wb'ing on LLE, focusing on maintaining balance without LOB to L.  Stand/step transfer Kinetron > w/c to L with max assist for LOB L.    Pt c/o dizziness with  standing ex on Kinetron; BP 120/70     Therapy Documentation Precautions:  Precautions Precautions: Fall Precaution Comments: NPO Restrictions Weight Bearing Restrictions: No   Oxygen Therapy SpO2: 92 % Pain: Pain Assessment Pain Score: 0-No pain Faces Pain Scale: No hurt PAINAD (Pain Assessment in Advanced Dementia) Breathing: normal     See FIM for current functional status  Therapy/Group: Individual Therapy  Syrus Nakama 05/03/2012, 5:02 PM

## 2012-05-03 NOTE — Progress Notes (Signed)
Per State Regulation 482.30 This chart was reviewed for medical necessity with respect to the patient's Admission/Duration of stay. PEG placement today. [name] Dare                 Nurse Care Manager            Next Review Date: 05/06/12

## 2012-05-03 NOTE — Progress Notes (Signed)
Occupational Therapy Note  Patient Details  Name: [name] MRN: [id number] Date of Birth: 09/05/1926 Today's Date: 05/03/2012  Pt. Missed 30 mins. of OT due to medical procedure Humberto Seals 05/03/2012, 3:04 PM

## 2012-05-03 NOTE — Care Management Note (Signed)
Met with pt to discuss d/c plans. Pt voiced concerns about her husband's ability to provide care for her and states that he works 3 days a week. She has no other family that can help out. Pt states that she would like to go to a SNF for a short stay prior to going home. She requested [facility] as first choice. Informed Becky Dupree, SW.

## 2012-05-03 NOTE — Progress Notes (Signed)
Occupational Therapy Skilled Therapy Intervention and Weekly Progress Note  Patient Details  Name: [name] MRN: [id number] Date of Birth: 1926-03-23  Today's Date: 05/03/2012 Time: 0800-0900 Time Calculation (min): 60 min  Patient has met 0 of 3 short term goals.  Pt is progressing with reaching a supervision level with transfers, but is still at a min assist level.  STG and LTG of simple meal prep discontinues as pt is still NPO and this is not a desired goal for her at this time.  Pt is making progress with her balance and activity tolerance.  Patient continues to demonstrate the following deficits: decreased standing balance with strong push to left and decreased activity tolerance and therefore will continue to benefit from skilled OT intervention to enhance overall performance with BADL and Reduce care partner burden.  Patient progressing toward long term goals..  Plan of care revisions: LTGs of transfers changed from mod I to supervision and LTG of simple meal prep discontinued..  OT Short Term Goals Week 1:  OT Short Term Goal 1 (Week 1): Pt will transfer to toilet with supervision with or without RW. OT Short Term Goal 1 - Progress (Week 1): Progressing toward goal OT Short Term Goal 2 (Week 1): Pt will dress LB with supervision. OT Short Term Goal 2 - Progress (Week 1): Progressing toward goal OT Short Term Goal 3 (Week 1): Pt will perform simple meal prep with supervision. OT Short Term Goal 3 - Progress (Week 1): Discontinued (comment) Week 2:  OT Short Term Goal 1 (Week 2): Pt will transfer in a walk in shower with supervision. OT Short Term Goal 2 (Week 2): Pt will transfer to the toilet with supervision. OT Short Term Goal 3 (Week 2): Pt will toilet with mod I. OT Short Term Goal 4 (Week 2): Pt will bathe in shower with mod I.  Skilled Therapeutic Interventions/Progress Updates:   Pt seen for BADL retraining of toileting, bathing, and dressing with a focus on activity  tolerance, balance, and functional mobility with transfers.  Pt needed to prepare for surgery for a g-tube placement.  Bathing consisted of a sponge bath and cbh wipes. This am pt used gown only with underwear and socks/shoes. Pt was having a great deal of difficulty with urinary incontinence this am and needed to be changed 2x.  Pt worked on stand pivot transfers with RW with steady assist to close supervision 6x, with long rest breaks between each transfer.  Skilled OT to continue with Balance/vestibular training;Discharge planning;DME/adaptive equipment instruction;Functional mobility training;Neuromuscular re-education;Patient/family education;Self Care/advanced ADL retraining;Therapeutic Activities;Therapeutic Exercise to maximize her level of independence.  Therapy Documentation Precautions:  Precautions Precautions: Fall Precaution Comments: NPO Restrictions Weight Bearing Restrictions: No   Pain: Pain Assessment Pain Assessment: No/denies pain ADL: ADL Eating: NPO Grooming: Supervision/safety Where Assessed-Grooming: Standing at sink Upper Body Bathing: Setup Where Assessed-Upper Body Bathing: Wheelchair;Sitting at sink Lower Body Bathing: Supervision/safety Where Assessed-Lower Body Bathing: Wheelchair;Standing at sink;Sitting at sink Upper Body Dressing: Setup Where Assessed-Upper Body Dressing: Wheelchair Lower Body Dressing: Contact guard Where Assessed-Lower Body Dressing: Standing at sink;Wheelchair Toileting: Contact guard Where Assessed-Toileting: Teacher, adult education: Minimal Dentist Method: Stand pivot;Ambulating Acupuncturist: Engineer, technical sales: Not assessed Film/video editor: Insurance underwriter Method: Designer, industrial/product: Emergency planning/management officer ADL Comments: Pt fatigues extremely easily.    See FIM for current functional status  Therapy/Group: Individual  Therapy  Kiley Solimine 05/03/2012, 9:01 AM

## 2012-05-03 NOTE — Progress Notes (Signed)
SLP Cancellation Note  Treatment cancelled today due to medical issues with patient which prohibited therapy.  Patient NPO for peg tube placement; as a result, SLP unable to address dysphagia goals and patient missed 60 minutes of skilled therapy.   Fae Pippin, M.A., CCC-SLP [phone]  Ragnar Waas 05/03/2012, 1:19 PM

## 2012-05-03 NOTE — Progress Notes (Signed)
Patient ID: [name], female   DOB: 28-Jun-1926, 78 y.o.   MRN: [id number] Subjective/Complaints: Scheduled for PEG.  On amitriptyline BID Objective: Vital Signs: Blood pressure 90/30, pulse 101, temperature 97.8 F (36.6 C), temperature source Oral, resp. rate 20, weight 60.2 kg (132 lb 11.5 oz), SpO2 94.00%. No results found. Results for orders placed during the hospital encounter of 04/25/12 (from the past 72 hour(s))  GLUCOSE, CAPILLARY     Status: Abnormal   Collection Time   04/30/12  7:59 AM      Component Value Range Comment   Glucose-Capillary 250 (*) 70 - 99 (mg/dL)    Comment 1 Notify RN     GLUCOSE, CAPILLARY     Status: Abnormal   Collection Time   04/30/12 11:24 AM      Component Value Range Comment   Glucose-Capillary 250 (*) 70 - 99 (mg/dL)    Comment 1 Notify RN     GLUCOSE, CAPILLARY     Status: Abnormal   Collection Time   04/30/12  3:48 PM      Component Value Range Comment   Glucose-Capillary 135 (*) 70 - 99 (mg/dL)   GLUCOSE, CAPILLARY     Status: Abnormal   Collection Time   04/30/12  9:23 PM      Component Value Range Comment   Glucose-Capillary 312 (*) 70 - 99 (mg/dL)    Comment 1 Notify RN     GLUCOSE, CAPILLARY     Status: Abnormal   Collection Time   05/01/12  1:11 AM      Component Value Range Comment   Glucose-Capillary 233 (*) 70 - 99 (mg/dL)   GLUCOSE, CAPILLARY     Status: Abnormal   Collection Time   05/01/12  5:27 AM      Component Value Range Comment   Glucose-Capillary 242 (*) 70 - 99 (mg/dL)    Comment 1 Notify RN     GLUCOSE, CAPILLARY     Status: Abnormal   Collection Time   05/01/12  7:36 AM      Component Value Range Comment   Glucose-Capillary 143 (*) 70 - 99 (mg/dL)   GLUCOSE, CAPILLARY     Status: Abnormal   Collection Time   05/01/12 12:28 PM      Component Value Range Comment   Glucose-Capillary 295 (*) 70 - 99 (mg/dL)   GLUCOSE, CAPILLARY     Status: Abnormal   Collection Time   05/01/12  4:15 PM      Component Value Range Comment    Glucose-Capillary 233 (*) 70 - 99 (mg/dL)   GLUCOSE, CAPILLARY     Status: Abnormal   Collection Time   05/01/12  8:10 PM      Component Value Range Comment   Glucose-Capillary 236 (*) 70 - 99 (mg/dL)    Comment 1 Notify RN     GLUCOSE, CAPILLARY     Status: Abnormal   Collection Time   05/02/12 12:52 AM      Component Value Range Comment   Glucose-Capillary 250 (*) 70 - 99 (mg/dL)    Comment 1 Notify RN     GLUCOSE, CAPILLARY     Status: Abnormal   Collection Time   05/02/12  3:40 AM      Component Value Range Comment   Glucose-Capillary 169 (*) 70 - 99 (mg/dL)   CBC     Status: Normal   Collection Time   05/02/12  5:30 AM  Component Value Range Comment   WBC 8.8  4.0 - 10.5 (K/uL)    RBC 4.11  3.87 - 5.11 (MIL/uL)    Hemoglobin 13.0  12.0 - 15.0 (g/dL)    HCT 16.1  09.6 - 04.5 (%)    MCV 95.6  78.0 - 100.0 (fL)    MCH 31.6  26.0 - 34.0 (pg)    MCHC 33.1  30.0 - 36.0 (g/dL)    RDW 40.9  81.1 - 91.4 (%)    Platelets 195  150 - 400 (K/uL)   BASIC METABOLIC PANEL     Status: Abnormal   Collection Time   05/02/12  5:30 AM      Component Value Range Comment   Sodium 140  135 - 145 (mEq/L)    Potassium 4.3  3.5 - 5.1 (mEq/L)    Chloride 100  96 - 112 (mEq/L)    CO2 29  19 - 32 (mEq/L)    Glucose, Bld 188 (*) 70 - 99 (mg/dL)    BUN 43 (*) 6 - 23 (mg/dL)    Creatinine, Ser 7.82 (*) 0.50 - 1.10 (mg/dL)    Calcium 9.6  8.4 - 10.5 (mg/dL)    GFR calc non Af Amer 36 (*) >90 (mL/min)    GFR calc Af Amer 42 (*) >90 (mL/min)   PROTIME-INR     Status: Normal   Collection Time   05/02/12  5:30 AM      Component Value Range Comment   Prothrombin Time 13.1  11.6 - 15.2 (seconds)    INR 0.97  0.00 - 1.49    APTT     Status: Normal   Collection Time   05/02/12  5:30 AM      Component Value Range Comment   aPTT 28  24 - 37 (seconds)   GLUCOSE, CAPILLARY     Status: Abnormal   Collection Time   05/02/12  7:33 AM      Component Value Range Comment   Glucose-Capillary 180 (*) 70 - 99  (mg/dL)    Comment 1 Documented in Chart      Comment 2 Notify RN     GLUCOSE, CAPILLARY     Status: Abnormal   Collection Time   05/02/12 11:19 AM      Component Value Range Comment   Glucose-Capillary 264 (*) 70 - 99 (mg/dL)    Comment 1 Documented in Chart      Comment 2 Notify RN     GLUCOSE, CAPILLARY     Status: Abnormal   Collection Time   05/02/12  4:31 PM      Component Value Range Comment   Glucose-Capillary 211 (*) 70 - 99 (mg/dL)    Comment 1 Notify RN     GLUCOSE, CAPILLARY     Status: Abnormal   Collection Time   05/02/12  7:39 PM      Component Value Range Comment   Glucose-Capillary 214 (*) 70 - 99 (mg/dL)    Comment 1 Notify RN     GLUCOSE, CAPILLARY     Status: Abnormal   Collection Time   05/03/12 12:03 AM      Component Value Range Comment   Glucose-Capillary 177 (*) 70 - 99 (mg/dL)    Comment 1 Notify RN     MRSA PCR SCREENING     Status: Normal   Collection Time   05/03/12  2:46 AM      Component Value Range Comment   MRSA by  PCR NEGATIVE  NEGATIVE       HEENT: normal Cardio: RRR Resp: CTA B/L GI: BS positive Extremity:  No Edema Skin:   Bruise RLQ and LLQ at lovenox injection sites Neuro: Alert/Oriented, Dysarthric and Other truncal ataxia Musc/Skel:  Normal   Assessment/Plan: 1. 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 Physiatrist is providing close team supervision and 24 hour management of active medical problems listed below. Physiatrist and rehab team continue to assess barriers to discharge/monitor patient progress toward functional and medical goals. Team conference FIM: FIM - Bathing Bathing Steps Patient Completed: Chest;Right Arm;Left Arm;Abdomen;Front perineal area;Buttocks;Right upper leg;Left upper leg;Right lower leg (including foot);Left lower leg (including foot) Bathing: 5:  Supervision: Safety issues/verbal cues  FIM - Upper Body Dressing/Undressing Upper body dressing/undressing steps patient completed: Thread/unthread right bra strap;Thread/unthread left bra strap;Hook/unhook bra;Thread/unthread right sleeve of pullover shirt/dresss;Thread/unthread left sleeve of pullover shirt/dress;Put head through opening of pull over shirt/dress;Pull shirt over trunk Upper body dressing/undressing: 5: Set-up assist to: Obtain clothing/put away FIM - Lower Body Dressing/Undressing Lower body dressing/undressing steps patient completed: Thread/unthread right underwear leg;Thread/unthread left underwear leg;Thread/unthread right pants leg;Pull underwear up/down;Thread/unthread left pants leg;Pull pants up/down;Don/Doff left sock;Don/Doff right sock;Don/Doff right shoe;Don/Doff left shoe;Fasten/unfasten right shoe;Fasten/unfasten left shoe Lower body dressing/undressing: 5: Supervision: Safety issues/verbal cues  FIM - Toileting Toileting steps completed by patient: Adjust clothing prior to toileting;Performs perineal hygiene;Adjust clothing after toileting Toileting Assistive Devices: Grab bar or rail for support Toileting: 4: Steadying assist  FIM - Diplomatic Services operational officer Devices: Grab bars Toilet Transfers: 4-To toilet/BSC: Min A (steadying Pt. > 75%);4-From toilet/BSC: Min A (steadying Pt. > 75%)  FIM - Bed/Chair Transfer Bed/Chair Transfer Assistive Devices: HOB elevated Bed/Chair Transfer: 4: Supine > Sit: Min A (steadying Pt. > 75%/lift 1 leg);4: Sit > Supine: Min A (steadying pt. > 75%/lift 1 leg);4: Bed > Chair or W/C: Min A (steadying Pt. > 75%);4: Chair or W/C > Bed: Min A (steadying Pt. > 75%)  FIM - Locomotion: Wheelchair Distance: 60 Locomotion: Wheelchair: 1: Travels less than 50 ft with supervision, cueing or coaxing FIM - Locomotion: Ambulation Locomotion: Ambulation Assistive Devices: Designer, industrial/product Ambulation/Gait Assistance: 3: Mod  assist Locomotion: Ambulation: 2: Travels 50 - 149 ft with moderate assistance (Pt: 50 - 74%)  Comprehension Comprehension Mode: Auditory Comprehension: 6-Follows complex conversation/direction: With extra time/assistive device  Expression Expression Mode: Verbal Expression: 6-Expresses complex ideas: With extra time/assistive device  Social Interaction Social Interaction: 6-Interacts appropriately with others with medication or extra time (anti-anxiety, antidepressant).  Problem Solving Problem Solving: 6-Solves complex problems: With extra time  Memory Memory: 6-More than reasonable amt of time  2. Anticoagulation/DVT prophylaxis with Pharmaceutical: Lovenox 3. Pain Management: acetominophen 4.  DM-uncontrolled  Slowly titrating lantus change to BID, uses levimir at home, Will need PEG today hold lovenox 5.  Spastic bladder plus stress incont.  PT to teach Kegel's  LOS (Days) 8 A FACE TO FACE EVALUATION WAS PERFORMED  Beuna Bolding E 05/03/2012, 7:04 AM

## 2012-05-04 LAB — GLUCOSE, CAPILLARY
Glucose-Capillary: 224 mg/dL — ABNORMAL HIGH (ref 70–99)
Glucose-Capillary: 171 mg/dL — ABNORMAL HIGH (ref 70–99)
Glucose-Capillary: 190 mg/dL — ABNORMAL HIGH (ref 70–99)

## 2012-05-04 NOTE — Progress Notes (Signed)
Nutrition Consult/Follow-up  RD consult for EN initiation & management. S/p G-tube placement per Radiology 5/10. Jevity 1.2 formula infusing at 60 ml/hr x 20 hrs (clamped for 4 hours for therapies) with Prostat liquid protein 30 via tube daily providing 1540 total kcals, 82 gm protein, 968 ml of free water. Per RN, tolerating well.  Diet Order:  NPO  Meds: Scheduled Meds:   . sodium chloride   Intravenous Once  . antiseptic oral rinse  15 mL Mouth Rinse q12n4p  . aspirin  81 mg Per Tube Daily  . atorvastatin  10 mg Per Tube q1800  .  ceFAZolin (ANCEF) IV  1 g Intravenous On Call  . chlorhexidine  15 mL Mouth Rinse BID  . diltiazem  60 mg Oral Q6H  . feeding supplement (JEVITY 1.2 CAL)  1,000 mL Per Tube Q24H  . feeding supplement  30 mL Per Tube Daily  . insulin aspart  0-20 Units Subcutaneous Q4H  . insulin glargine  15 Units Subcutaneous BID  . irbesartan  150 mg Oral Daily  . levothyroxine  100 mcg Per Tube Q0600  . potassium chloride  10 mEq Per Tube BID  . DISCONTD: amitriptyline  25 mg Per Tube BID   Continuous Infusions:  PRN Meds:.acetaminophen, fentaNYL, HYDROcodone-acetaminophen, iohexol, midazolam, ondansetron (ZOFRAN) IV, ondansetron, phenol, sodium chloride  Labs:  CMP     Component Value Date/Time   NA 140 05/02/2012 0530   K 4.3 05/02/2012 0530   CL 100 05/02/2012 0530   CO2 29 05/02/2012 0530   GLUCOSE 188* 05/02/2012 0530   BUN 43* 05/02/2012 0530   CREATININE 1.33* 05/02/2012 0530   CALCIUM 9.6 05/02/2012 0530   PROT 6.7 04/26/2012 0548   ALBUMIN 3.2* 04/26/2012 0548   AST 14 04/26/2012 0548   ALT 12 04/26/2012 0548   ALKPHOS 71 04/26/2012 0548   BILITOT 0.4 04/26/2012 0548   GFRNONAA 36* 05/02/2012 0530   GFRAA 42* 05/02/2012 0530     Intake/Output Summary (Last 24 hours) at 05/04/12 1223 Last data filed at 05/04/12 0550  Gross per 24 hour  Intake      0 ml  Output    900 ml  Net   -900 ml    CBG (last 3)   Basename 05/04/12 1152 05/04/12 0732 05/04/12 0440  GLUCAP 171*  212* 273*    Weight Status:  57.4 kg (5/11) -- stable  Re-estimated needs:  1400-1600 kcals, 65-75 gm protein  Nutrition Dx:  Inadequate Oral Intake, ongoing  Goal:  EN to meet >90% of estimated nutrition needs, met Monitor: EN tolerance, weight, labs, I/O's  Intervention:    Continue current EN regimen  RD to follow for nutrition care plan  [name] Pager #:  [phone]

## 2012-05-04 NOTE — Progress Notes (Signed)
Physical Therapy Session Note  Patient Details  Name: [name] MRN: [id number] Date of Birth: 03-24-1926  Today's Date: 05/04/2012 Time: 4696-2952 Time Calculation (min): 28 min  Short Term Goals: Week 1:  PT Short Term Goal 1 (Week 1): Pt will perform bed mobility with modified indepdence, using typical movement patterns. PT Short Term Goal 2 (Week 1): Pt will perform basic transfers with supervision. PT Short Term Goal 3 (Week 1): Pt will perform gait x 75'  in controlled environment, with min assist; in home environment x 50' with min assist. PT Short Term Goal 4 (Week 1): Pt will propel w/c using bil LEs x 50' with supervision, for strengthening.   PT Short Term Goal 5 (Week 1): Pt will ascend and descend 5 steps with 2 rails with min assist.  Skilled Therapeutic Interventions/Progress Updates:  Pt initially declining therapy today due to still sore from tube placement yesterday as well as groggy from pain meds today, but eventually agreeable to bedside tx. Pt demonstrating unsafe behaviors with mobility this tx, needing Mod A for steadying and safety cues. Tx focused on gait training with RW, safe transfers, and static standing balance, but pt limited this tx due to not feeling well.      Therapy Documentation Precautions:  Precautions Precautions: Fall Precaution Comments: NPO Restrictions Weight Bearing Restrictions: No   Pain: Pt reports "soreness," from tube placment yesterday, but no pain.    Mobility: Supine>sit with S HOB elevated 20 degrees.  Stand-pivot transfers x3 with Mod A for steadying.  Sit<>stand x4 with Min A for lifting after multiple attempts.    Locomotion : Ambulation Ambulation/Gait Assistance: 3: Mod assist  1x8', 1x5', 1x36' with RW in room and steadying assist as pt persist with general unsteadiness. Switched RW for one with fixed, rather than swivel wheels, which seemed to help with control somewhat. Pt tends to let go of RW grip and reach for  bed or other objects in room for steadying. Pt needed assist for turns, but did not had full LOB to L this tx.     Balance: Static standing balance activities in ROM: static stance x30 sec, marching - attempted, but pt states "I can't!" but is able to lift feet from ground somewhat. Pt tolerance for this activity limited, demanding to sit back down.         See FIM for current functional status  Therapy/Group: Individual Therapy  Virl Cagey, PT 05/04/2012, 3:26 PM

## 2012-05-04 NOTE — Progress Notes (Signed)
Patient ID: [name], female   DOB: [birth year], 78 y.o.   MRN: [id number] Subjective/Complaints: Scheduled for PEG.  On amitriptyline BID Objective: Vital Signs: Blood pressure 115/65, pulse 91, temperature 98.1 F (36.7 C), temperature source Oral, resp. rate 18, weight 126 lb 8.7 oz (57.4 kg), SpO2 99.00%.  Well-developed female, elderly. Chest clear to auscultation cardiac exam S1 and S2 are regular. Abdominal exam I felt is postoperative extremities no edema. Assessment/Plan: 1. 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 Physiatrist is providing close team supervision and 24 hour management of active medical problems listed below. Physiatrist and rehab team continue to assess barriers to discharge/monitor patient progress toward functional and medical goals. Team conference FIM: FIM - Bathing Bathing Steps Patient Completed: Chest;Right Arm;Left Arm;Abdomen;Front perineal area;Buttocks;Right upper leg;Left upper leg;Right lower leg (including foot);Left lower leg (including foot) Bathing: 5: Supervision: Safety issues/verbal cues  FIM - Upper Body Dressing/Undressing Upper body dressing/undressing steps patient completed: Thread/unthread right bra strap;Thread/unthread left bra strap;Hook/unhook bra;Thread/unthread right sleeve of pullover shirt/dresss;Thread/unthread left sleeve of pullover shirt/dress;Put head through opening of pull over shirt/dress;Pull shirt over trunk Upper body dressing/undressing: 5: Set-up assist to: Obtain clothing/put away FIM - Lower Body Dressing/Undressing Lower body dressing/undressing steps patient completed: Thread/unthread right underwear leg;Thread/unthread left underwear leg;Thread/unthread right pants leg;Pull underwear up/down;Thread/unthread left pants leg;Pull pants up/down;Don/Doff left  sock;Don/Doff right sock;Don/Doff right shoe;Don/Doff left shoe;Fasten/unfasten right shoe;Fasten/unfasten left shoe Lower body dressing/undressing: 5: Supervision: Safety issues/verbal cues  FIM - Toileting Toileting steps completed by patient: Adjust clothing prior to toileting;Performs perineal hygiene;Adjust clothing after toileting Toileting Assistive Devices: Grab bar or rail for support Toileting: 4: Steadying assist  FIM - Diplomatic Services operational officer Devices: Bedside commode Toilet Transfers: 3-To toilet/BSC: Mod A (lift or lower assist);4-From toilet/BSC: Min A (steadying Pt. > 75%)  FIM - Banker Devices: Walker;Bed rails;Arm rests;HOB elevated (20 degrees) Bed/Chair Transfer: 5: Supine > Sit: Supervision (verbal cues/safety issues);3: Bed > Chair or W/C: Mod A (lift or lower assist);4: Chair or W/C > Bed: Min A (steadying Pt. > 75%)  FIM - Locomotion: Wheelchair Distance: 60 Locomotion: Wheelchair: 0: Activity did not occur FIM - Locomotion: Ambulation Locomotion: Ambulation Assistive Devices: Designer, industrial/product Ambulation/Gait Assistance: 3: Mod assist Locomotion: Ambulation: 1: Travels less than 50 ft with moderate assistance (Pt: 50 - 74%)  Comprehension Comprehension Mode: Auditory Comprehension: 5-Understands complex 90% of the time/Cues < 10% of the time  Expression Expression Mode: Verbal Expression: 6-Expresses complex ideas: With extra time/assistive device  Social Interaction Social Interaction: 5-Interacts appropriately 90% of the time - Needs monitoring or encouragement for participation or interaction.  Problem Solving Problem Solving: 5-Solves basic 90% of the time/requires cueing < 10% of the time  Memory Memory: 6-More than reasonable amt of time  2. Anticoagulation/DVT prophylaxis with Pharmaceutical: Lovenox 3. Pain Management: acetominophen 4.  DM-uncontrolled  Slowly titrating lantus change  to BID, uses levimir at home, Will need PEG today hold lovenox CBG (last 3)   Basename 05/04/12 1557 05/04/12 1152 05/04/12 0732  GLUCAP 140* 171* 212*     5.  Spastic bladder plus stress incont.  PT to teach Kegel's  LOS (Days) 9 A FACE TO FACE EVALUATION WAS PERFORMED  [name] 05/04/2012, 4:51 PM

## 2012-05-04 NOTE — Progress Notes (Signed)
Speech Language Pathology Daily Session Note  Patient Details  Name: [name] MRN: [id number] Date of Birth: Sep 26, 1926  Today's Date: 05/04/2012 Time: 1005-1030 Time Calculation (min): 25 min Missed 25 minutes  Short Term Goals: Week 2: SLP Short Term Goal 1 (Week 2): Patient Lindsay demonstrate improved ability to consume ice chips with dereased overt s/s of aspiration to determine readiness for repeat objective swallow evaluation. SLP Short Term Goal 2 (Week 2): Patient Lindsay consume trials of puree with 5 hard effortful swallows and no overt s/s of penetration or aspiration.  SLP Short Term Goal 3 (Week 2): Patient Lindsay complete oral care with modified independence.  Skilled Therapeutic Interventions: Patient with peg tube placement yesterday and current NPO status; SLP facilitated session with set up and supervision semantic cues for thoroughness with oral care via suctioning.  With attempts to sit edge of bed or raise head of bed patient reports discomfort from incision site (RN already medicated) as a result head of bed was slightly reclined for patient's comfort upon patient self feeding ice chips via teaspoon she had a gagging response due to decreased oral containment and premature loss of ice chip into posterior portion of oral cavity.  Patient was able to orally expectorate ice chip; however due to difficulty with patient toleration proper positioning and decreased oral containment issues SLP felt it was not safe to continue trials at this time and session ended early.   Daily Session Precautions/Restrictions    FIM:  Comprehension Comprehension Mode: Auditory Comprehension: 5-Understands complex 90% of the time/Cues < 10% of the time Expression Expression Mode: Verbal Expression: 6-Expresses complex ideas: With extra time/assistive device Social Interaction Social Interaction: 5-Interacts appropriately 90% of the time - Needs monitoring or encouragement for participation or  interaction. Problem Solving Problem Solving: 5-Solves basic 90% of the time/requires cueing < 10% of the time Memory Memory: 6-More than reasonable amt of time General    Pain Pain Assessment Pain Score: 7 surgical pain, SLP readjusted patient and RN already medicated  Therapy/Group: Individual Therapy  [name], CCC-SLP [phone]  Romina Divirgilio 05/04/2012, 10:35 AM

## 2012-05-05 LAB — GLUCOSE, CAPILLARY
Glucose-Capillary: 102 mg/dL — ABNORMAL HIGH (ref 70–99)
Glucose-Capillary: 209 mg/dL — ABNORMAL HIGH (ref 70–99)
Glucose-Capillary: 248 mg/dL — ABNORMAL HIGH (ref 70–99)

## 2012-05-05 MED ORDER — INSULIN GLARGINE 100 UNIT/ML ~~LOC~~ SOLN
18.0000 [IU] | Freq: Two times a day (BID) | SUBCUTANEOUS | Status: DC
  Administered 2012-05-05 (×2): 18 [IU] via SUBCUTANEOUS

## 2012-05-05 MED ORDER — ENOXAPARIN SODIUM 30 MG/0.3ML ~~LOC~~ SOLN
30.0000 mg | SUBCUTANEOUS | Status: DC
  Administered 2012-05-05: 30 mg via SUBCUTANEOUS
  Filled 2012-05-05 (×3): qty 0.3

## 2012-05-05 NOTE — Progress Notes (Signed)
Patient ID: [name], female   DOB: May 02, 1926, 78 y.o.   MRN: [id number]  Objective:  She has mild pain at the PEG tube site. Vital Signs: Blood pressure 129/77, pulse 104, temperature 98.6 F (37 C), temperature source Oral, resp. rate 19, weight 126 lb 8.7 oz (57.4 kg), SpO2 96.00%.  Well-developed female, elderly. Chest clear to auscultation cardiac exam S1 and S2 are regular. Abdominal exam I felt is postoperative extremities no edema. Assessment/Plan: 1. 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 Physiatrist is providing close team supervision and 24 hour management of active medical problems listed below. Physiatrist and rehab team continue to assess barriers to discharge/monitor patient progress toward functional and medical goals. Team conference FIM: FIM - Bathing Bathing Steps Patient Completed: Chest;Right Arm;Left Arm;Abdomen;Front perineal area;Buttocks;Right upper leg;Left upper leg;Right lower leg (including foot);Left lower leg (including foot) Bathing: 5: Supervision: Safety issues/verbal cues  FIM - Upper Body Dressing/Undressing Upper body dressing/undressing steps patient completed: Thread/unthread right bra strap;Thread/unthread left bra strap;Hook/unhook bra;Thread/unthread right sleeve of pullover shirt/dresss;Thread/unthread left sleeve of pullover shirt/dress;Put head through opening of pull over shirt/dress;Pull shirt over trunk Upper body dressing/undressing: 5: Set-up assist to: Obtain clothing/put away FIM - Lower Body Dressing/Undressing Lower body dressing/undressing steps patient completed: Thread/unthread right underwear leg;Thread/unthread left underwear leg;Thread/unthread right pants leg;Pull underwear up/down;Thread/unthread left pants leg;Pull pants up/down;Don/Doff left sock;Don/Doff right  sock;Don/Doff right shoe;Don/Doff left shoe;Fasten/unfasten right shoe;Fasten/unfasten left shoe Lower body dressing/undressing: 5: Supervision: Safety issues/verbal cues  FIM - Toileting Toileting steps completed by patient: Adjust clothing prior to toileting;Performs perineal hygiene;Adjust clothing after toileting Toileting Assistive Devices: Grab bar or rail for support Toileting: 4: Steadying assist  FIM - Diplomatic Services operational officer Devices: Bedside commode Toilet Transfers: 3-To toilet/BSC: Mod A (lift or lower assist);4-From toilet/BSC: Min A (steadying Pt. > 75%)  FIM - Banker Devices: Walker;Bed rails;Arm rests;HOB elevated (20 degrees) Bed/Chair Transfer: 5: Supine > Sit: Supervision (verbal cues/safety issues);3: Bed > Chair or W/C: Mod A (lift or lower assist);4: Chair or W/C > Bed: Min A (steadying Pt. > 75%)  FIM - Locomotion: Wheelchair Distance: 60 Locomotion: Wheelchair: 0: Activity did not occur FIM - Locomotion: Ambulation Locomotion: Ambulation Assistive Devices: Designer, industrial/product Ambulation/Gait Assistance: 3: Mod assist Locomotion: Ambulation: 1: Travels less than 50 ft with moderate assistance (Pt: 50 - 74%)  Comprehension Comprehension Mode: Auditory Comprehension: 5-Understands complex 90% of the time/Cues < 10% of the time  Expression Expression Mode: Verbal Expression: 6-Expresses complex ideas: With extra time/assistive device  Social Interaction Social Interaction: 5-Interacts appropriately 90% of the time - Needs monitoring or encouragement for participation or interaction.  Problem Solving Problem Solving: 5-Solves basic 90% of the time/requires cueing < 10% of the time  Memory Memory: 6-More than reasonable amt of time  2. Anticoagulation/DVT prophylaxis with Pharmaceutical: Lovenox (discontinued May 02, 2012)-I suspect Lovenox was discontinued for PEG placement. Will resume Lovenox  therapy. 3. Pain Management: acetominophen 4.  DM-uncontrolled  Slowly titrating lantus change to BID, uses levimir at home, Will need PEG today hold lovenox CBG (last 3)   Basename 05/05/12 0747 05/05/12 0437 05/05/12 0011  GLUCAP 224* 228* 248*   Will increase insulin.  5.  Spastic bladder plus stress incont.  PT to teach Kegel's  LOS (Days) 10 A FACE TO FACE EVALUATION WAS PERFORMED  [name] 05/05/2012, 8:59 AM

## 2012-05-05 NOTE — Progress Notes (Signed)
Patient voides small amt on BSC with PVR 618 ml, voided again approximately 150 ml on BSC with bladder scan of 477 ml.  At 2145, patient voided another small amt on BSC with PVR scan of 354 ml.  Patient in and out catherized at this time for 500 ml.  Patient tolerated well.

## 2012-05-05 NOTE — Progress Notes (Signed)
Occupational Therapy Session Note  Patient Details  Name: [name] MRN: [id number] Date of Birth: 1926-07-04  Today's Date: 05/05/2012 Time: 1020-1115 Time Calculation (min): 55 min Pain:  7/10   SEE MAR  Short Term Goals: Week 1:  OT Short Term Goal 1 (Week 1): Pt will transfer to toilet with supervision with or without RW. OT Short Term Goal 1 - Progress (Week 1): Progressing toward goal OT Short Term Goal 2 (Week 1): Pt will dress LB with supervision. OT Short Term Goal 2 - Progress (Week 1): Progressing toward goal OT Short Term Goal 3 (Week 1): Pt will perform simple meal prep with supervision. OT Short Term Goal 3 - Progress (Week 1): Discontinued (comment)  Skilled Therapeutic Interventions/Progress Updates:    Engaged in bathing and dressing, toilet transfer.  PY VERY SORE FROM PROCEDURE ON Friday.  Needed assist with socks today.  Was minimal assist with sit to stand and cues for foot placement and technique.    Therapy Documentation Precautions::  Leans to left        See FIM for current functional status  Therapy/Group: Individual Therapy  Humberto Seals 05/05/2012, 11:15 AM

## 2012-05-06 ENCOUNTER — Ambulatory Visit: Payer: Medicare Other | Admitting: Gynecology

## 2012-05-06 LAB — GLUCOSE, CAPILLARY
Glucose-Capillary: 156 mg/dL — ABNORMAL HIGH (ref 70–99)
Glucose-Capillary: 175 mg/dL — ABNORMAL HIGH (ref 70–99)
Glucose-Capillary: 245 mg/dL — ABNORMAL HIGH (ref 70–99)

## 2012-05-06 LAB — URINALYSIS, ROUTINE W REFLEX MICROSCOPIC
Hgb urine dipstick: NEGATIVE
Leukocytes, UA: NEGATIVE
Nitrite: NEGATIVE
Protein, ur: NEGATIVE mg/dL
Specific Gravity, Urine: 1.018 (ref 1.005–1.030)
Urobilinogen, UA: 1 mg/dL (ref 0.0–1.0)

## 2012-05-06 MED ORDER — DABIGATRAN ETEXILATE MESYLATE 150 MG PO CAPS: 150.0000 mg | ORAL_CAPSULE | Freq: Two times a day (BID) | ORAL | Status: DC

## 2012-05-06 MED ORDER — DABIGATRAN ETEXILATE MESYLATE 75 MG PO CAPS
75.0000 mg | ORAL_CAPSULE | Freq: Two times a day (BID) | ORAL | Status: DC
  Administered 2012-05-06: 75 mg via ORAL
  Filled 2012-05-06 (×6): qty 1

## 2012-05-06 MED ORDER — INSULIN GLARGINE 100 UNIT/ML ~~LOC~~ SOLN
20.0000 [IU] | Freq: Two times a day (BID) | SUBCUTANEOUS | Status: DC
  Administered 2012-05-06 (×2): 20 [IU] via SUBCUTANEOUS

## 2012-05-06 NOTE — Progress Notes (Signed)
Occupational Therapy Session Note  Patient Details  Name: [name] MRN: [id number] Date of Birth: March 01, 1926  Today's Date: 05/06/2012 Time: 8469-6295 Time Calculation (min): 41 min - missed 19 minutes due to fatigue  Short Term Goals: Week 1:  OT Short Term Goal 1 (Week 1): Pt will transfer to toilet with supervision with or without RW. OT Short Term Goal 1 - Progress (Week 1): Progressing toward goal OT Short Term Goal 2 (Week 1): Pt will dress LB with supervision. OT Short Term Goal 2 - Progress (Week 1): Progressing toward goal OT Short Term Goal 3 (Week 1): Pt will perform simple meal prep with supervision. OT Short Term Goal 3 - Progress (Week 1): Discontinued (comment) Week 2:  OT Short Term Goal 1 (Week 2): Pt will transfer in a walk in shower with supervision. OT Short Term Goal 2 (Week 2): Pt will transfer to the toilet with supervision. OT Short Term Goal 3 (Week 2): Pt will toilet with mod I. OT Short Term Goal 4 (Week 2): Pt will bathe in shower with mod I.  Skilled Therapeutic Interventions/Progress Updates:   Pt seen for BADL retraining of toileting, bathing, and dressing with a focus on activity tolerance.  Pt was complaining of soreness in her abdomen from the PEG tube placement. Pt needed increased assistance with LB adls due to abdominal soreness.  She demonstrated improved balance of contact guard/ steady assist with ambulating to toilet with walker and standing activities.  Pt complained of extreme fatigue and needed several rest breaks.  After 40 minutes of activity, pt became extremely exhausted and stated that she had to lie down and sleep.  LTGs modified today to supervision with all activities due to her poor activity tolerance and decreased balance.     Therapy Documentation   Pain: Pain Assessment Pain Assessment: 0-10 Pain Score:   4 Pain Type: Surgical pain Pain Location: Abdomen Pain Descriptors: Sore Pain Onset: On-going Pain Intervention(s): RN  made aware ADL: ADL Eating: NPO Grooming: Supervision/safety Where Assessed-Grooming: Standing at sink Upper Body Bathing: Setup Where Assessed-Upper Body Bathing: Wheelchair;Sitting at sink Lower Body Bathing: Supervision/safety Where Assessed-Lower Body Bathing: Wheelchair;Standing at sink;Sitting at sink Upper Body Dressing: Setup Where Assessed-Upper Body Dressing: Wheelchair Lower Body Dressing: Contact guard Where Assessed-Lower Body Dressing: Standing at sink;Wheelchair Toileting: Contact guard Where Assessed-Toileting: Teacher, adult education: Minimal Dentist Method: Stand pivot;Ambulating Acupuncturist: Engineer, technical sales: Not assessed Film/video editor: Insurance underwriter Method: Designer, industrial/product: Emergency planning/management officer ADL Comments: Pt fatigues extremely easily.  See FIM for current functional status  Therapy/Group: Individual Therapy  Leilynn Pilat 05/06/2012, 9:53 AM

## 2012-05-06 NOTE — Progress Notes (Signed)
Patient ID: [name], female   DOB: 07/06/1926, 78 y.o.   MRN: [id number] Subjective/Complaints:  PEG placed mild soreness with movement.  On amitriptyline BID Objective: Vital Signs: Blood pressure 122/77, pulse 96, temperature 98.7 F (37.1 C), temperature source Oral, resp. rate 20, weight 57.4 kg (126 lb 8.7 oz), SpO2 95.00%. No results found. Results for orders placed during the hospital encounter of 04/25/12 (from the past 72 hour(s))  GLUCOSE, CAPILLARY     Status: Abnormal   Collection Time   05/03/12  8:03 AM      Component Value Range Comment   Glucose-Capillary 128 (*) 70 - 99 (mg/dL)    Comment 1 Notify RN     GLUCOSE, CAPILLARY     Status: Abnormal   Collection Time   05/03/12 11:57 AM      Component Value Range Comment   Glucose-Capillary 213 (*) 70 - 99 (mg/dL)    Comment 1 Notify RN     GLUCOSE, CAPILLARY     Status: Abnormal   Collection Time   05/03/12  4:18 PM      Component Value Range Comment   Glucose-Capillary 160 (*) 70 - 99 (mg/dL)    Comment 1 Notify RN     GLUCOSE, CAPILLARY     Status: Abnormal   Collection Time   05/03/12  8:43 PM      Component Value Range Comment   Glucose-Capillary 213 (*) 70 - 99 (mg/dL)    Comment 1 Notify RN     GLUCOSE, CAPILLARY     Status: Abnormal   Collection Time   05/03/12  9:36 PM      Component Value Range Comment   Glucose-Capillary 250 (*) 70 - 99 (mg/dL)    Comment 1 Notify RN     GLUCOSE, CAPILLARY     Status: Abnormal   Collection Time   05/04/12  1:11 AM      Component Value Range Comment   Glucose-Capillary 224 (*) 70 - 99 (mg/dL)    Comment 1 Notify RN     GLUCOSE, CAPILLARY     Status: Abnormal   Collection Time   05/04/12  4:40 AM      Component Value Range Comment   Glucose-Capillary 273 (*) 70 - 99 (mg/dL)    Comment 1 Notify RN     GLUCOSE, CAPILLARY     Status: Abnormal   Collection Time   05/04/12  7:32 AM      Component Value Range Comment   Glucose-Capillary 212 (*) 70 - 99 (mg/dL)    Comment 1 Notify RN     GLUCOSE, CAPILLARY     Status: Abnormal   Collection Time   05/04/12 11:52 AM      Component Value Range Comment   Glucose-Capillary 171 (*) 70 - 99 (mg/dL)   GLUCOSE, CAPILLARY     Status: Abnormal   Collection Time   05/04/12  3:57 PM      Component Value Range Comment   Glucose-Capillary 140 (*) 70 - 99 (mg/dL)   GLUCOSE, CAPILLARY     Status: Abnormal   Collection Time   05/04/12  8:19 PM      Component Value Range Comment   Glucose-Capillary 190 (*) 70 - 99 (mg/dL)   GLUCOSE, CAPILLARY     Status: Abnormal   Collection Time   05/04/12  9:32 PM      Component Value Range Comment   Glucose-Capillary 209 (*) 70 - 99 (mg/dL)  GLUCOSE, CAPILLARY     Status: Abnormal   Collection Time   05/05/12 12:11 AM      Component Value Range Comment   Glucose-Capillary 248 (*) 70 - 99 (mg/dL)    Comment 1 Notify RN     GLUCOSE, CAPILLARY     Status: Abnormal   Collection Time   05/05/12  4:37 AM      Component Value Range Comment   Glucose-Capillary 228 (*) 70 - 99 (mg/dL)    Comment 1 Notify RN     GLUCOSE, CAPILLARY     Status: Abnormal   Collection Time   05/05/12  7:47 AM      Component Value Range Comment   Glucose-Capillary 224 (*) 70 - 99 (mg/dL)    Comment 1 Notify RN     GLUCOSE, CAPILLARY     Status: Abnormal   Collection Time   05/05/12 12:01 PM      Component Value Range Comment   Glucose-Capillary 218 (*) 70 - 99 (mg/dL)    Comment 1 Notify RN     GLUCOSE, CAPILLARY     Status: Abnormal   Collection Time   05/05/12  4:41 PM      Component Value Range Comment   Glucose-Capillary 102 (*) 70 - 99 (mg/dL)    Comment 1 Notify RN     GLUCOSE, CAPILLARY     Status: Abnormal   Collection Time   05/05/12  8:09 PM      Component Value Range Comment   Glucose-Capillary 157 (*) 70 - 99 (mg/dL)    Comment 1 Notify RN     GLUCOSE, CAPILLARY     Status: Abnormal   Collection Time   05/06/12 12:15 AM      Component Value Range Comment   Glucose-Capillary  175 (*) 70 - 99 (mg/dL)    Comment 1 Notify RN     GLUCOSE, CAPILLARY     Status: Abnormal   Collection Time   05/06/12  4:04 AM      Component Value Range Comment   Glucose-Capillary 229 (*) 70 - 99 (mg/dL)    Comment 1 Notify RN        HEENT: normal Cardio: RRR Resp: CTA B/L GI: BS positive Extremity:  No Edema Skin:   Bruise RLQ and LLQ at lovenox injection sites Neuro: Alert/Oriented, Dysarthric and Other truncal ataxia Musc/Skel:  Normal   Assessment/Plan: 1. 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 Physiatrist is providing close team supervision and 24 hour management of active medical problems listed below. Physiatrist and rehab team continue to assess barriers to discharge/monitor patient progress toward functional and medical goals. Team conference FIM: FIM - Bathing Bathing Steps Patient Completed: Chest;Right Arm;Left Arm;Abdomen;Front perineal area;Buttocks;Right upper leg;Left upper leg;Right lower leg (including foot);Left lower leg (including foot) Bathing: 5: Supervision: Safety issues/verbal cues  FIM - Upper Body Dressing/Undressing Upper body dressing/undressing steps patient completed: Thread/unthread right bra strap;Thread/unthread left bra strap;Hook/unhook bra;Thread/unthread right sleeve of pullover shirt/dresss;Thread/unthread left sleeve of pullover shirt/dress;Put head through opening of pull over shirt/dress;Pull shirt over trunk Upper body dressing/undressing: 5: Set-up assist to: Obtain clothing/put away FIM - Lower Body Dressing/Undressing Lower body dressing/undressing steps patient completed: Thread/unthread right underwear leg;Thread/unthread left underwear leg;Thread/unthread right pants leg;Pull underwear up/down;Thread/unthread left pants leg;Pull pants up/down;Don/Doff left sock;Don/Doff  right sock;Don/Doff right shoe;Don/Doff left shoe;Fasten/unfasten right shoe;Fasten/unfasten left shoe  Lower body dressing/undressing: 4: Steadying Assist  FIM - Toileting Toileting steps completed by patient: Performs perineal hygiene;Adjust clothing prior to toileting;Adjust clothing after toileting Toileting Assistive Devices: Grab bar or rail for support Toileting: 4: Steadying assist  FIM - Diplomatic Services operational officer Devices: Bedside commode Toilet Transfers: 4-To toilet/BSC: Min A (steadying Pt. > 75%);4-From toilet/BSC: Min A (steadying Pt. > 75%)  FIM - Bed/Chair Transfer Bed/Chair Transfer Assistive Devices: Bed rails Bed/Chair Transfer: 5: Supine > Sit: Supervision (verbal cues/safety issues);4: Sit > Supine: Min A (steadying pt. > 75%/lift 1 leg);4: Supine > Sit: Min A (steadying Pt. > 75%/lift 1 leg)  FIM - Locomotion: Wheelchair Distance: 60 Locomotion: Wheelchair: 0: Activity did not occur FIM - Locomotion: Ambulation Locomotion: Ambulation Assistive Devices: Designer, industrial/product Ambulation/Gait Assistance: 3: Mod assist Locomotion: Ambulation: 1: Travels less than 50 ft with moderate assistance (Pt: 50 - 74%)  Comprehension Comprehension Mode: Auditory Comprehension: 5-Understands complex 90% of the time/Cues < 10% of the time  Expression Expression Mode: Verbal Expression: 6-Expresses complex ideas: With extra time/assistive device  Social Interaction Social Interaction: 5-Interacts appropriately 90% of the time - Needs monitoring or encouragement for participation or interaction.  Problem Solving Problem Solving: 5-Solves basic 90% of the time/requires cueing < 10% of the time  Memory Memory Assistive Devices: Memory book Memory: 6-More than reasonable amt of time  2. Anticoagulation/DVT prophylaxis with Pharmaceutical: Lovenox 3. Pain Management: acetominophen 4.  DM-uncontrolled  Slowly titrating lantus change to BID, uses levimir at home  total long acting 48 Unit per day, will increase lantus to 20 Unit bid,         5.  Spastic bladder plus stress incont.  PT to teach Kegel's  LOS (Days) 11 A FACE TO FACE EVALUATION WAS PERFORMED  [name] E 05/06/2012, 7:14 AM

## 2012-05-06 NOTE — Progress Notes (Signed)
Inpatient Diabetes Program Recommendations  AACE/ADA: New Consensus Statement on Inpatient Glycemic Control (2009)  Target Ranges:  Prepandial:   less than 140 mg/dL      Peak postprandial:   less than 180 mg/dL (1-2 hours)      Critically ill patients:  140 - 180 mg/dL   Reason for Visit: Hyperglycemia on tube feeds  Inpatient Diabetes Program Recommendations Insulin - Basal: . Insulin - Meal Coverage: Please use tube feed coverage q 4 hrs while getting tube feeds.  Less correction will be needed and better control achieved.  Note: Thank you, Lenor Coffin, RN, CNS, Diabetes Coordinator [phone])

## 2012-05-06 NOTE — Progress Notes (Signed)
Physical Therapy Note  Patient Details  Name: [name] MRN: [id number] Date of Birth: [birth year] Today's Date: 05/06/2012  9:30-10:30 individual therapy 4/10 soreness in PEG site. Abdominal binder not present. Secretary ordered and ace bandage applied to support site.  Performed LAQ with 2# weight x 15 each and hip flexion with soccer ball with weight focusing on control of LEs., standing hip abduction with 2 UE support with vc for technique and tactile cue for preventing compensation at trunk with left leg. Steps ups x 3 each, gait training and dynamic balanace to gather horseshoes focusing on walker safety and. Weight shifts and reaching.    Julian Reil 05/06/2012, 10:19 AM

## 2012-05-06 NOTE — Progress Notes (Signed)
Patient scanned for 500 ml, then voided large amt on BSC.  PVR scan = 350 ml.  In and out cath = 700 ml.  Patient tolerated well.

## 2012-05-06 NOTE — Progress Notes (Signed)
Speech Language Pathology Daily Session Note  Patient Details  Name: [name] MRN: [id number] Date of Birth: [birth year]  Today's Date: 05/06/2012 Time: 1105-1200 Time Calculation (min): 55 min  Short Term Goals: Week 2: SLP Short Term Goal 1 (Week 2): Patient will demonstrate improved ability to consume ice chips with dereased overt s/s of aspiration to determine readiness for repeat objective swallow evaluation. SLP Short Term Goal 2 (Week 2): Patient will consume trials of puree with 5 hard effortful swallows and no overt s/s of penetration or aspiration.  SLP Short Term Goal 3 (Week 2): Patient will complete oral care with modified independence.  Skilled Therapeutic Interventions: Session focused on skilled treatment of dysphagia. SLP facilitated session with NMES 3b placement with 2 electrodes at 9mA and minimal assist semantic cues to utilize slow pacing with self feeding and complete 4-5 hard effortful swallows per 1/2 teaspoon of applesauce with consistent throat clears throughout session and intermittent wet vocal quality.  Daily Session Precautions/Restrictions    FIM:  Comprehension Comprehension Mode: Auditory Comprehension: 5-Follows basic conversation/direction: With extra time/assistive device Expression Expression Mode: Verbal Expression: 5-Expresses complex 90% of the time/cues < 10% of the time Social Interaction Social Interaction: 5-Interacts appropriately 90% of the time - Needs monitoring or encouragement for participation or interaction. Problem Solving Problem Solving: 5-Solves basic 90% of the time/requires cueing < 10% of the time Memory Memory: 6-More than reasonable amt of time General    Pain Pain Assessment Pain Assessment: No/denies pain Pain Score: 0-No pain  Therapy/Group: Individual Therapy  [name], CCC-SLP [phone]  [name] 05/06/2012, 12:28 PM

## 2012-05-06 NOTE — Progress Notes (Signed)
Orthopedic Tech Progress Note Patient Details:  [name] May 15, 1926 [id number]  Other Ortho Devices Ortho Device Location: panel binder Ortho Device Interventions: Sarita Bottom 05/06/2012, 11:06 AM

## 2012-05-06 NOTE — Progress Notes (Signed)
Per State Regulation 482.30 This chart was reviewed for medical necessity with respect to the patient's Admission/Duration of stay. PEG placement last week, pain at site. Recent onset of urinary retention, requiring I&O caths. PA aware.  Pt participating in therapies and slowly progressing. Fatigues easily. [name] Dare                 Nurse Care Manager            Next Review Date: 05/09/12

## 2012-05-06 NOTE — Progress Notes (Signed)
Physical Therapy Note  Patient Details  Name: MARYFRANCES PORTUGAL MRN: [id number] Date of Birth: [birth year] Today's Date: 05/06/2012  14:30-15:00 individual therapy pt complained of pain in Peg tube site unrated. Pt repositioned.  Dynamic gait 2 x 150' with rw including head turns, stops, turns, toe walking, heel walking, and high ,march with rt. to increase stance time on left. Pt with 1 major LOB while entering room past wet floor sign . Therapist regained balance and pt taught how to side step with rw through small spaces.   Julian Reil 05/06/2012, 3:01 PM

## 2012-05-06 NOTE — Progress Notes (Signed)
Dr. Cato Mulligan notified of patient urinary retention with high PVR's.  Order received:  In and out cath q 6 hrs prn if no void or PVR greater than or equal to 350 ml.

## 2012-05-07 ENCOUNTER — Other Ambulatory Visit: Payer: Self-pay

## 2012-05-07 DIAGNOSIS — I69991 Dysphagia following unspecified cerebrovascular disease: Secondary | ICD-10-CM

## 2012-05-07 DIAGNOSIS — Z5189 Encounter for other specified aftercare: Secondary | ICD-10-CM

## 2012-05-07 DIAGNOSIS — I69993 Ataxia following unspecified cerebrovascular disease: Secondary | ICD-10-CM

## 2012-05-07 LAB — GLUCOSE, CAPILLARY
Glucose-Capillary: 182 mg/dL — ABNORMAL HIGH (ref 70–99)
Glucose-Capillary: 247 mg/dL — ABNORMAL HIGH (ref 70–99)
Glucose-Capillary: 226 mg/dL — ABNORMAL HIGH (ref 70–99)
Glucose-Capillary: 165 mg/dL — ABNORMAL HIGH (ref 70–99)
Glucose-Capillary: 234 mg/dL — ABNORMAL HIGH (ref 70–99)

## 2012-05-07 MED ORDER — ASPIRIN 81 MG PO CHEW
81.0000 mg | CHEWABLE_TABLET | Freq: Every day | ORAL | Status: DC
  Administered 2012-05-07 – 2012-05-10 (×4): 81 mg via ORAL
  Filled 2012-05-07 (×4): qty 1

## 2012-05-07 MED ORDER — INSULIN GLARGINE 100 UNIT/ML ~~LOC~~ SOLN
25.0000 [IU] | Freq: Two times a day (BID) | SUBCUTANEOUS | Status: DC
  Administered 2012-05-07 – 2012-05-09 (×5): 25 [IU] via SUBCUTANEOUS

## 2012-05-07 MED ORDER — GLUCERNA 1.2 CAL PO LIQD
237.0000 mL | Freq: Every day | ORAL | Status: DC
  Administered 2012-05-07 – 2012-05-09 (×10): 237 mL
  Filled 2012-05-07 (×21): qty 237

## 2012-05-07 MED ORDER — TAMSULOSIN HCL 0.4 MG PO CAPS
0.4000 mg | ORAL_CAPSULE | Freq: Every day | ORAL | Status: DC
  Administered 2012-05-08 – 2012-05-09 (×2): 0.4 mg via ORAL
  Filled 2012-05-07 (×4): qty 1

## 2012-05-07 MED ORDER — CLOPIDOGREL BISULFATE 75 MG PO TABS
75.0000 mg | ORAL_TABLET | Freq: Every day | ORAL | Status: DC
  Administered 2012-05-07 – 2012-05-10 (×4): 75 mg via ORAL
  Filled 2012-05-07 (×5): qty 1

## 2012-05-07 NOTE — Progress Notes (Signed)
Patient ID: [name], female   DOB: 01-Jan-1926, 78 y.o.   MRN: [id number] Subjective/Complaints:  PEG placed mild soreness with movement.  On amitriptyline BID Objective: Vital Signs: Blood pressure 137/70, pulse 87, temperature 98.1 F (36.7 C), temperature source Oral, resp. rate 16, height 5\' 3"  (1.6 m), weight 57.4 kg (126 lb 8.7 oz), SpO2 99.00%. No results found. Results for orders placed during the hospital encounter of 04/25/12 (from the past 72 hour(s))  GLUCOSE, CAPILLARY     Status: Abnormal   Collection Time   05/04/12  7:32 AM      Component Value Range Comment   Glucose-Capillary 212 (*) 70 - 99 (mg/dL)    Comment 1 Notify RN     GLUCOSE, CAPILLARY     Status: Abnormal   Collection Time   05/04/12 11:52 AM      Component Value Range Comment   Glucose-Capillary 171 (*) 70 - 99 (mg/dL)   GLUCOSE, CAPILLARY     Status: Abnormal   Collection Time   05/04/12  3:57 PM      Component Value Range Comment   Glucose-Capillary 140 (*) 70 - 99 (mg/dL)   GLUCOSE, CAPILLARY     Status: Abnormal   Collection Time   05/04/12  8:19 PM      Component Value Range Comment   Glucose-Capillary 190 (*) 70 - 99 (mg/dL)   GLUCOSE, CAPILLARY     Status: Abnormal   Collection Time   05/04/12  9:32 PM      Component Value Range Comment   Glucose-Capillary 209 (*) 70 - 99 (mg/dL)   GLUCOSE, CAPILLARY     Status: Abnormal   Collection Time   05/05/12 12:11 AM      Component Value Range Comment   Glucose-Capillary 248 (*) 70 - 99 (mg/dL)    Comment 1 Notify RN     GLUCOSE, CAPILLARY     Status: Abnormal   Collection Time   05/05/12  4:37 AM      Component Value Range Comment   Glucose-Capillary 228 (*) 70 - 99 (mg/dL)    Comment 1 Notify RN     GLUCOSE, CAPILLARY     Status: Abnormal   Collection Time   05/05/12  7:47 AM      Component Value Range Comment   Glucose-Capillary 224 (*) 70 - 99 (mg/dL)    Comment 1 Notify RN     GLUCOSE, CAPILLARY     Status: Abnormal   Collection Time   05/05/12 12:01 PM      Component Value Range Comment   Glucose-Capillary 218 (*) 70 - 99 (mg/dL)    Comment 1 Notify RN     GLUCOSE, CAPILLARY     Status: Abnormal   Collection Time   05/05/12  4:41 PM      Component Value Range Comment   Glucose-Capillary 102 (*) 70 - 99 (mg/dL)    Comment 1 Notify RN     GLUCOSE, CAPILLARY     Status: Abnormal   Collection Time   05/05/12  8:09 PM      Component Value Range Comment   Glucose-Capillary 157 (*) 70 - 99 (mg/dL)    Comment 1 Notify RN     GLUCOSE, CAPILLARY     Status: Abnormal   Collection Time   05/06/12 12:15 AM      Component Value Range Comment   Glucose-Capillary 175 (*) 70 - 99 (mg/dL)    Comment 1 Notify RN  GLUCOSE, CAPILLARY     Status: Abnormal   Collection Time   05/06/12  4:04 AM      Component Value Range Comment   Glucose-Capillary 229 (*) 70 - 99 (mg/dL)    Comment 1 Notify RN     GLUCOSE, CAPILLARY     Status: Abnormal   Collection Time   05/06/12  7:20 AM      Component Value Range Comment   Glucose-Capillary 245 (*) 70 - 99 (mg/dL)    Comment 1 Notify RN     GLUCOSE, CAPILLARY     Status: Abnormal   Collection Time   05/06/12 11:33 AM      Component Value Range Comment   Glucose-Capillary 156 (*) 70 - 99 (mg/dL)    Comment 1 Notify RN     URINALYSIS, ROUTINE W REFLEX MICROSCOPIC     Status: Normal   Collection Time   05/06/12  1:35 PM      Component Value Range Comment   Color, Urine YELLOW  YELLOW     APPearance CLEAR  CLEAR     Specific Gravity, Urine 1.018  1.005 - 1.030     pH 6.5  5.0 - 8.0     Glucose, UA NEGATIVE  NEGATIVE (mg/dL)    Hgb urine dipstick NEGATIVE  NEGATIVE     Bilirubin Urine NEGATIVE  NEGATIVE     Ketones, ur NEGATIVE  NEGATIVE (mg/dL)    Protein, ur NEGATIVE  NEGATIVE (mg/dL)    Urobilinogen, UA 1.0  0.0 - 1.0 (mg/dL)    Nitrite NEGATIVE  NEGATIVE     Leukocytes, UA NEGATIVE  NEGATIVE  MICROSCOPIC NOT DONE ON URINES WITH NEGATIVE PROTEIN, BLOOD, LEUKOCYTES, NITRITE, OR GLUCOSE  <1000 mg/dL.  GLUCOSE, CAPILLARY     Status: Abnormal   Collection Time   05/06/12  4:10 PM      Component Value Range Comment   Glucose-Capillary 157 (*) 70 - 99 (mg/dL)   GLUCOSE, CAPILLARY     Status: Abnormal   Collection Time   05/06/12  9:12 PM      Component Value Range Comment   Glucose-Capillary 236 (*) 70 - 99 (mg/dL)    Comment 1 Notify RN     GLUCOSE, CAPILLARY     Status: Abnormal   Collection Time   05/07/12 12:14 AM      Component Value Range Comment   Glucose-Capillary 203 (*) 70 - 99 (mg/dL)    Comment 1 Notify RN     GLUCOSE, CAPILLARY     Status: Abnormal   Collection Time   05/07/12  2:17 AM      Component Value Range Comment   Glucose-Capillary 182 (*) 70 - 99 (mg/dL)   GLUCOSE, CAPILLARY     Status: Abnormal   Collection Time   05/07/12  5:21 AM      Component Value Range Comment   Glucose-Capillary 247 (*) 70 - 99 (mg/dL)    Comment 1 Notify RN        HEENT: normal Cardio: RRR Resp: CTA B/L GI: BS positive Extremity:  No Edema Skin:   Bruise RLQ and LLQ at lovenox injection sites Neuro: Alert/Oriented, Dysarthric and Other truncal ataxia Musc/Skel:  Normal   Assessment/Plan: 1. 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 Physiatrist is providing close team supervision and 24 hour management of active medical problems listed below.  Physiatrist and rehab team continue to assess barriers to discharge/monitor patient progress toward functional and medical goals. Team conference FIM: FIM - Bathing Bathing Steps Patient Completed: Chest;Right Arm;Left Arm;Abdomen;Front perineal area;Buttocks;Right upper leg;Left upper leg Bathing: 4: Min-Patient completes 8-9 69f 10 parts or 75+ percent  FIM - Upper Body Dressing/Undressing Upper body dressing/undressing steps patient completed: Hook/unhook  bra;Thread/unthread left sleeve of pullover shirt/dress;Thread/unthread right sleeve of pullover shirt/dresss;Thread/unthread right sleeve of front closure shirt/dress;Thread/unthread left sleeve of front closure shirt/dress;Button/unbutton shirt;Pull shirt around back of front closure shirt/dress Upper body dressing/undressing: 5: Supervision: Safety issues/verbal cues FIM - Lower Body Dressing/Undressing Lower body dressing/undressing steps patient completed: Thread/unthread right underwear leg;Thread/unthread left underwear leg;Pull underwear up/down;Thread/unthread left pants leg;Thread/unthread right pants leg;Pull pants up/down Lower body dressing/undressing: 4: Min-Patient completed 75 plus % of tasks  FIM - Toileting Toileting steps completed by patient: Adjust clothing prior to toileting;Performs perineal hygiene;Adjust clothing after toileting Toileting Assistive Devices: Grab bar or rail for support Toileting: 6: More than reasonable amount of time  FIM - Diplomatic Services operational officer Devices: Grab bars Toilet Transfers: 5-To toilet/BSC: Supervision (verbal cues/safety issues)  FIM - Banker Devices: Bed rails Bed/Chair Transfer: 4: Bed > Chair or W/C: Min A (steadying Pt. > 75%);4: Chair or W/C > Bed: Min A (steadying Pt. > 75%)  FIM - Locomotion: Wheelchair Distance: 60 Locomotion: Wheelchair: 0: Activity did not occur FIM - Locomotion: Ambulation Locomotion: Ambulation Assistive Devices: Designer, industrial/product Ambulation/Gait Assistance: 4: Min assist Locomotion: Ambulation: 4: Travels 150 ft or more with minimal assistance (Pt.>75%)  Comprehension Comprehension Mode: Auditory Comprehension: 5-Follows basic conversation/direction: With extra time/assistive device  Expression Expression Mode: Verbal Expression: 5-Expresses complex 90% of the time/cues < 10% of the time  Social Interaction Social Interaction: 5-Interacts  appropriately 90% of the time - Needs monitoring or encouragement for participation or interaction.  Problem Solving Problem Solving: 5-Solves basic 90% of the time/requires cueing < 10% of the time  Memory Memory Assistive Devices: Memory book Memory: 6-More than reasonable amt of time  2. Anticoagulation/DVT prophylaxis with Pharmaceutical: Lovenox 3. Pain Management: acetominophen 4.  DM-uncontrolled  Slowly titrating lantus change to BID, uses levimir at home total long acting 48 Unit per day, will increase lantus to 20 Unit bid,         5.  Urinary retention stress incont, avoid urecholine due to problems handling oral secretions, add flomax.  PT to teach Kegel's  LOS (Days) 12 A FACE TO FACE EVALUATION WAS PERFORMED  Derrin Currey E 05/07/2012, 6:59 AM

## 2012-05-07 NOTE — Progress Notes (Signed)
Social Work Patient ID: [name], female   DOB: 10/19/1926, 78 y.o.   MRN: [id number] Met with pt and husband to discuss discharge plan. Pt prefers [facility] to go for a short time until ready to go home. Husband reports at this point: " I can't take care of her."  Aware if no offers from [facility] will need to look at other facilities. Awaiting return call from [facility].  Continue to NH bed search.

## 2012-05-07 NOTE — Progress Notes (Signed)
Physical Therapy Session Note  Patient Details  Name: [name] MRN: [id number] Date of Birth: [birth year]  Today's Date: 05/07/2012 Time: 1005-1045 Time Calculation (min): 40 min  Short Term Goals: Week 1:  PT Short Term Goal 1 (Week 1): Pt will perform bed mobility with modified indepdence, using typical movement patterns. PT Short Term Goal 2 (Week 1): Pt will perform basic transfers with supervision. PT Short Term Goal 3 (Week 1): Pt will perform gait x 75'  in controlled environment, with min assist; in home environment x 50' with min assist. PT Short Term Goal 4 (Week 1): Pt will propel w/c using bil LEs x 50' with supervision, for strengthening.   PT Short Term Goal 5 (Week 1): Pt will ascend and descend 5 steps with 2 rails with min assist.      Skilled Therapeutic Interventions/Progress Updates:  AM- w/c mobility in controlled environment using bil LEs for strengthening; gait training in home environment moving sideways with RW between obstacles, backwards, multiple L turns.  Pt had LOB 1/4 when turning L, with supervision/ min assist .  Therapeutic exercise performed with bil LEs to increase strength for functional mobility: 10 x 1 each calf raises, mini squats with L then R foot forward in tandem stance; hip abduction, with min assist for balance.  Gait training x 150' with supervision> min assist, cueing for forward gaze, upright trunk, wider stance.  Discussed shoes with husband and pt.; recommended shoes with a wider heel counter than her present ones.    PM: bed mobility, transfers, gait, neuro re-ed  In sitting, pt continues to lean L and posterior.  She can verbalize this, but never self-corrects.    bed mobility  with cues for typical pattern for supine> sit.  Husband bought shoes for pt at lunch.  Pt's ankles looked more stable and she said the shoes felt good when she walks.  Neuro re-ed in sitting and standing for trunk activation for trunk and head righting  responses.  Pt stated she feels she is falling with full right LE wt bearing.    Transfer training bed>< large armchair x 5, with decreasing assistance min> supervision.    Gait training x 150' with RW, cues as above for posture.  Dynamic standing activity, R foot on footstool, using L hand to manipulate objects on L.  Pt demonstrated improving balance in standing during session.        Therapy Documentation Precautions:  Precautions Precautions: Fall Precaution Comments: NPO ; PEG Restrictions Weight Bearing Restrictions: No General:   Vital Signs:   Pain: Pain Assessment Pain Assessment: 0-10 Pain Score:   5 Faces Pain Scale: Hurts a little bit Pain Type: Surgical pain Pain Location: Abdomen Pain Descriptors: Sore Pain Onset: With Activity Pain Intervention(s): Other (Comment) (abdominal binder)       Therapy/Group: Individual Therapy  Krissia Schreier 05/07/2012, 3:44 PM

## 2012-05-07 NOTE — Progress Notes (Signed)
Occupational Therapy Session Note  Patient Details  Name: [name] MRN: [id number] Date of Birth: Feb 25, 1926  Today's Date: 05/07/2012 Time: 0808-0900 Time Calculation (min): 52 min  Short Term Goals: Week 1:  OT Short Term Goal 1 (Week 1): Pt will transfer to toilet with supervision with or without RW. OT Short Term Goal 1 - Progress (Week 1): Progressing toward goal OT Short Term Goal 2 (Week 1): Pt will dress LB with supervision. OT Short Term Goal 2 - Progress (Week 1): Progressing toward goal OT Short Term Goal 3 (Week 1): Pt will perform simple meal prep with supervision. OT Short Term Goal 3 - Progress (Week 1): Discontinued (comment) Week 2:  OT Short Term Goal 1 (Week 2): Pt will transfer in a walk in shower with supervision. OT Short Term Goal 2 (Week 2): Pt will transfer to the toilet with supervision. OT Short Term Goal 3 (Week 2): Pt will toilet with supervision. OT Short Term Goal 4 (Week 2): Pt will bathe with supervision.  Skilled Therapeutic Interventions/Progress Updates:   Pt seen for BADL retraining of toileting, bathing, and dressing with a focus on activity tolerance and standing balance.  Pt was very distracted by abdominal pain.  Overall able to complete all adls with supervision except for tying shoes as her abdomen hurt when she leaned forward.  Improved standing balance with supervision, but pt somewhat impulsive with movements.     Therapy Documentation Precautions:  Precautions Precautions: Fall Precaution Comments: NPO ; PEG Restrictions Weight Bearing Restrictions: No    Pain: Pain Assessment Pain Assessment: 0-10 Pain Score:   5 Faces Pain Scale: Hurts a little bit Pain Type: Surgical pain Pain Location: Abdomen Pain Descriptors: Sore Pain Onset: With Activity Pain Intervention(s): Other (Comment) (abdominal binder)  See FIM for current functional status  Therapy/Group: Individual Therapy  Serene Kopf 05/07/2012, 10:46 AM

## 2012-05-07 NOTE — Progress Notes (Signed)
Nutrition Follow-up/Consult  RD received consult for TF bolus regimen. Pt with elevated blood sugars of >200 this morning.  Diet Order:  NPO TF: Jevity 1.2 at 60 ml/hr x 20 hour with 30 ml Prostat daily providing 1540 total kcals, 82 gm protein, 968 ml of free water.  Meds: Scheduled Meds:   . sodium chloride   Intravenous Once  . aspirin  81 mg Oral Daily  . atorvastatin  10 mg Per Tube q1800  . chlorhexidine  15 mL Mouth Rinse BID  . clopidogrel  75 mg Oral Q breakfast  . diltiazem  60 mg Oral Q6H  . feeding supplement (JEVITY 1.2 CAL)  1,000 mL Per Tube Q24H  . feeding supplement  30 mL Per Tube Daily  . insulin aspart  0-20 Units Subcutaneous Q4H  . insulin glargine  25 Units Subcutaneous BID  . irbesartan  150 mg Oral Daily  . levothyroxine  100 mcg Per Tube Q0600  . potassium chloride  10 mEq Per Tube BID  . Tamsulosin HCl  0.4 mg Oral QPC supper  . DISCONTD: dabigatran  75 mg Oral Q12H  . DISCONTD: enoxaparin (LOVENOX) injection  30 mg Subcutaneous Q24H  . DISCONTD: insulin glargine  20 Units Subcutaneous BID   Continuous Infusions:  PRN Meds:.acetaminophen, HYDROcodone-acetaminophen, ondansetron (ZOFRAN) IV, ondansetron, phenol, sodium chloride  Labs:  CMP     Component Value Date/Time   NA 140 05/02/2012 0530   K 4.3 05/02/2012 0530   CL 100 05/02/2012 0530   CO2 29 05/02/2012 0530   GLUCOSE 188* 05/02/2012 0530   BUN 43* 05/02/2012 0530   CREATININE 1.33* 05/02/2012 0530   CALCIUM 9.6 05/02/2012 0530   PROT 6.7 04/26/2012 0548   ALBUMIN 3.2* 04/26/2012 0548   AST 14 04/26/2012 0548   ALT 12 04/26/2012 0548   ALKPHOS 71 04/26/2012 0548   BILITOT 0.4 04/26/2012 0548   GFRNONAA 36* 05/02/2012 0530   GFRAA 42* 05/02/2012 0530   CBG (last 3)   Basename 05/07/12 0711 05/07/12 0521 05/07/12 0217  GLUCAP 226* 247* 182*   Lab Results  Component Value Date   HGBA1C 7.5* 04/22/2012    Intake/Output Summary (Last 24 hours) at 05/07/12 0839 Last data filed at 05/07/12 0500  Gross per 24  hour  Intake     45 ml  Output   1938 ml  Net  -1893 ml   Weight Status:  57.4 kg - wt stable  BMI 22.4 - WNL  Estimated needs:  1400 - 1600 kcal, 65 - 75 grams protein  Nutrition Dx:  Inadequate oral intake - ongoing.  Goal:  EN to meet >90% of estimated nutrition needs, met.  Intervention:   1. To better meet nutrition needs, will change formula from Jevity 1.2 to Glucerna 1.2. Recommend bolus of 240 ml (1 can) Glucerna 1.2 five times daily. Each can contains 285 kcal, 14.2 grams protein, and 27.1 grams carbohydrate. 30 ml Prostat via tube daily. This regimen will provide: 1525 kcal, 86 grams protein, 136 grams carbohydrate, and 960 ml free water. 2. If no IVF, recommend free water flushes of 120 ml 6 times daily. 3. RD to continue to follow  Monitor:  EN tolerance, weights, labs, I/O's, CBGs  Adair Laundry Pager #:  [phone]

## 2012-05-07 NOTE — Progress Notes (Signed)
Speech Language Pathology Daily Session Note  Patient Details  Name: [name] MRN: [id number] Date of Birth: 11-19-1926  Today's Date: 05/07/2012 Time: 4098-1191 Time Calculation (min): 55 min  Short Term Goals: Week 1:    Skilled Therapeutic Interventions: Pt. was seen for dysphagia therapy utilizing NMES.  Position 3b was utilized for facilitation of increased UES opening at 9 mA.  Pt. Performed oral hygiene with min verbal cues.  She consumed alternating po trials of ice chip, tsp water and applesauce.  Difficult to assess swallow initiation time due to Ace bandage round electrodes and pt. Having a rather short neck.  Signs aspiration with applesauce included 3 throat clears out of 12 trials and with tsp size sips water pt. exhibited 3 throat clears, one cough and one wet vocal quality.  She performed pharyngeal and tongue base strengthening exercises with mod verbal and visual cues. Pt. Complained of chest pain near end of session with RN called to assess pt.  SLP suspected possible esophageal issue since she had relief once she belched and she reported some chest pain yesterday after po trials during ST session.  RN later reported pt. Had significant residual from her tube feeding.  Continue treatment plan.  Encouraged pt. To practice pharyngeal exercises.  Daily Session Precautions/Restrictions  Precautions Precautions: Fall Precaution Comments: NPO ; PEG FIM:  Comprehension Comprehension: 6-Follows complex conversation/direction: With extra time/assistive device Expression Expression: 6-Expresses complex ideas: With extra time/assistive device Social Interaction Social Interaction: 6-Interacts appropriately with others with medication or extra time (anti-anxiety, antidepressant). Problem Solving Problem Solving: 6-Solves complex problems: With extra time Memory Memory: 6-More than reasonable amt of time General    Pain Pain Assessment Pain Assessment: 0-10 Pain Score:    5 Faces Pain Scale: Hurts a little bit Pain Type: Surgical pain Pain Location: Abdomen Pain Descriptors: Sore Pain Onset: With Activity Pain Intervention(s): Other (Comment) (abdominal binder)  Therapy/Group: Individual Therapy  [name] 05/07/2012, 12:39 PM

## 2012-05-08 LAB — GLUCOSE, CAPILLARY
Glucose-Capillary: 132 mg/dL — ABNORMAL HIGH (ref 70–99)
Glucose-Capillary: 194 mg/dL — ABNORMAL HIGH (ref 70–99)
Glucose-Capillary: 74 mg/dL (ref 70–99)

## 2012-05-08 MED ORDER — ALUM & MAG HYDROXIDE-SIMETH 200-200-20 MG/5ML PO SUSP
30.0000 mL | Freq: Four times a day (QID) | ORAL | Status: DC | PRN
  Administered 2012-05-08 – 2012-05-09 (×4): 30 mL
  Filled 2012-05-08 (×4): qty 30

## 2012-05-08 NOTE — Progress Notes (Signed)
Patient ID: [name], female   DOB: 03/01/1926, 78 y.o.   MRN: [id number] Subjective/Complaints: No straight caths since yest am Now on bolus feeds Objective: Vital Signs: Blood pressure 105/62, pulse 89, temperature 97.9 F (36.6 C), temperature source Oral, resp. rate 18, height 5\' 3"  (1.6 m), weight 57.9 kg (127 lb 10.3 oz), SpO2 96.00%. No results found. Results for orders placed during the hospital encounter of 04/25/12 (from the past 72 hour(s))  GLUCOSE, CAPILLARY     Status: Abnormal   Collection Time   05/05/12  7:47 AM      Component Value Range Comment   Glucose-Capillary 224 (*) 70 - 99 (mg/dL)    Comment 1 Notify RN     GLUCOSE, CAPILLARY     Status: Abnormal   Collection Time   05/05/12 12:01 PM      Component Value Range Comment   Glucose-Capillary 218 (*) 70 - 99 (mg/dL)    Comment 1 Notify RN     GLUCOSE, CAPILLARY     Status: Abnormal   Collection Time   05/05/12  4:41 PM      Component Value Range Comment   Glucose-Capillary 102 (*) 70 - 99 (mg/dL)    Comment 1 Notify RN     GLUCOSE, CAPILLARY     Status: Abnormal   Collection Time   05/05/12  8:09 PM      Component Value Range Comment   Glucose-Capillary 157 (*) 70 - 99 (mg/dL)    Comment 1 Notify RN     GLUCOSE, CAPILLARY     Status: Abnormal   Collection Time   05/06/12 12:15 AM      Component Value Range Comment   Glucose-Capillary 175 (*) 70 - 99 (mg/dL)    Comment 1 Notify RN     GLUCOSE, CAPILLARY     Status: Abnormal   Collection Time   05/06/12  4:04 AM      Component Value Range Comment   Glucose-Capillary 229 (*) 70 - 99 (mg/dL)    Comment 1 Notify RN     GLUCOSE, CAPILLARY     Status: Abnormal   Collection Time   05/06/12  7:20 AM      Component Value Range Comment   Glucose-Capillary 245 (*) 70 - 99 (mg/dL)    Comment 1 Notify RN     GLUCOSE, CAPILLARY     Status: Abnormal   Collection Time   05/06/12 11:33 AM      Component Value Range Comment   Glucose-Capillary 156 (*) 70 - 99  (mg/dL)    Comment 1 Notify RN     URINALYSIS, ROUTINE W REFLEX MICROSCOPIC     Status: Normal   Collection Time   05/06/12  1:35 PM      Component Value Range Comment   Color, Urine YELLOW  YELLOW     APPearance CLEAR  CLEAR     Specific Gravity, Urine 1.018  1.005 - 1.030     pH 6.5  5.0 - 8.0     Glucose, UA NEGATIVE  NEGATIVE (mg/dL)    Hgb urine dipstick NEGATIVE  NEGATIVE     Bilirubin Urine NEGATIVE  NEGATIVE     Ketones, ur NEGATIVE  NEGATIVE (mg/dL)    Protein, ur NEGATIVE  NEGATIVE (mg/dL)    Urobilinogen, UA 1.0  0.0 - 1.0 (mg/dL)    Nitrite NEGATIVE  NEGATIVE     Leukocytes, UA NEGATIVE  NEGATIVE  MICROSCOPIC NOT DONE ON URINES  WITH NEGATIVE PROTEIN, BLOOD, LEUKOCYTES, NITRITE, OR GLUCOSE <1000 mg/dL.  GLUCOSE, CAPILLARY     Status: Abnormal   Collection Time   05/06/12  4:10 PM      Component Value Range Comment   Glucose-Capillary 157 (*) 70 - 99 (mg/dL)   GLUCOSE, CAPILLARY     Status: Abnormal   Collection Time   05/06/12  9:12 PM      Component Value Range Comment   Glucose-Capillary 236 (*) 70 - 99 (mg/dL)    Comment 1 Notify RN     GLUCOSE, CAPILLARY     Status: Abnormal   Collection Time   05/07/12 12:14 AM      Component Value Range Comment   Glucose-Capillary 203 (*) 70 - 99 (mg/dL)    Comment 1 Notify RN     GLUCOSE, CAPILLARY     Status: Abnormal   Collection Time   05/07/12  2:17 AM      Component Value Range Comment   Glucose-Capillary 182 (*) 70 - 99 (mg/dL)   GLUCOSE, CAPILLARY     Status: Abnormal   Collection Time   05/07/12  5:21 AM      Component Value Range Comment   Glucose-Capillary 247 (*) 70 - 99 (mg/dL)    Comment 1 Notify RN     GLUCOSE, CAPILLARY     Status: Abnormal   Collection Time   05/07/12  7:11 AM      Component Value Range Comment   Glucose-Capillary 226 (*) 70 - 99 (mg/dL)    Comment 1 Notify RN     GLUCOSE, CAPILLARY     Status: Abnormal   Collection Time   05/07/12 11:25 AM      Component Value Range Comment    Glucose-Capillary 162 (*) 70 - 99 (mg/dL)    Comment 1 Notify RN     GLUCOSE, CAPILLARY     Status: Abnormal   Collection Time   05/07/12  4:24 PM      Component Value Range Comment   Glucose-Capillary 165 (*) 70 - 99 (mg/dL)   GLUCOSE, CAPILLARY     Status: Abnormal   Collection Time   05/07/12  8:04 PM      Component Value Range Comment   Glucose-Capillary 234 (*) 70 - 99 (mg/dL)   GLUCOSE, CAPILLARY     Status: Abnormal   Collection Time   05/07/12 11:55 PM      Component Value Range Comment   Glucose-Capillary 132 (*) 70 - 99 (mg/dL)    Comment 1 Notify RN     GLUCOSE, CAPILLARY     Status: Normal   Collection Time   05/08/12  4:17 AM      Component Value Range Comment   Glucose-Capillary 74  70 - 99 (mg/dL)   GLUCOSE, CAPILLARY     Status: Abnormal   Collection Time   05/08/12  7:28 AM      Component Value Range Comment   Glucose-Capillary 194 (*) 70 - 99 (mg/dL)    Comment 1 Notify RN        HEENT: normal Cardio: RRR Resp: CTA B/L GI: BS positive Extremity:  No Edema Skin:   Bruise RLQ and LLQ at lovenox injection sites Neuro: Alert/Oriented, Dysarthric and Other truncal ataxia Musc/Skel:  Normal   Assessment/Plan: 1. 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 Physiatrist is providing  close team supervision and 24 hour management of active medical problems listed below. Physiatrist and rehab team continue to assess barriers to discharge/monitor patient progress toward functional and medical goals. Team conference FIM: FIM - Bathing Bathing Steps Patient Completed: Chest;Right Arm;Left Arm;Abdomen;Front perineal area;Buttocks;Right upper leg;Left upper leg;Right lower leg (including foot);Left lower leg (including foot) Bathing: 5: Supervision: Safety issues/verbal cues  FIM - Upper Body  Dressing/Undressing Upper body dressing/undressing steps patient completed: Thread/unthread right sleeve of front closure shirt/dress;Thread/unthread left sleeve of front closure shirt/dress;Button/unbutton shirt;Pull shirt around back of front closure shirt/dress Upper body dressing/undressing: 5: Supervision: Safety issues/verbal cues FIM - Lower Body Dressing/Undressing Lower body dressing/undressing steps patient completed: Thread/unthread right underwear leg;Thread/unthread left underwear leg;Pull underwear up/down;Thread/unthread right pants leg;Thread/unthread left pants leg;Pull pants up/down;Don/Doff right shoe;Don/Doff left sock;Don/Doff left shoe;Don/Doff right sock Lower body dressing/undressing: 4: Min-Patient completed 75 plus % of tasks  FIM - Toileting Toileting steps completed by patient: Adjust clothing prior to toileting;Performs perineal hygiene;Adjust clothing after toileting Toileting Assistive Devices: Grab bar or rail for support Toileting: 5: Supervision: Safety issues/verbal cues  FIM - Diplomatic Services operational officer Devices: Art gallery manager Transfers: 4-To toilet/BSC: Min A (steadying Pt. > 75%);4-From toilet/BSC: Min A (steadying Pt. > 75%)  FIM - Bed/Chair Transfer Bed/Chair Transfer Assistive Devices: Bed rails Bed/Chair Transfer: 4: Bed > Chair or W/C: Min A (steadying Pt. > 75%);4: Chair or W/C > Bed: Min A (steadying Pt. > 75%)  FIM - Locomotion: Wheelchair Distance: 60 Locomotion: Wheelchair: 2: Travels 50 - 149 ft with supervision, cueing or coaxing (using bil LEs) FIM - Locomotion: Ambulation Locomotion: Ambulation Assistive Devices: Designer, industrial/product Ambulation/Gait Assistance: 4: Min assist Locomotion: Ambulation: 4: Travels 150 ft or more with minimal assistance (Pt.>75%)  Comprehension Comprehension Mode: Auditory Comprehension: 6-Follows complex conversation/direction: With extra time/assistive device  Expression Expression Mode:  Verbal Expression: 6-Expresses complex ideas: With extra time/assistive device  Social Interaction Social Interaction: 6-Interacts appropriately with others with medication or extra time (anti-anxiety, antidepressant).  Problem Solving Problem Solving: 6-Solves complex problems: With extra time  Memory Memory Assistive Devices: Memory book Memory: 6-More than reasonable amt of time  2. Anticoagulation/DVT prophylaxis with Pharmaceutical: Lovenox 3. Pain Management: acetominophen 4.  DM-uncontrolled  Slowly titrating lantus change to BID, uses levimir at home total long acting 48 Unit per day, will increase lantus to 25 Unit bid,  Now on bolus feeds 5 x per day use novalog with bolus feeds       5.  Urinary retention + stress incont doing better with emptying, avoid urecholine due to problems handling oral secretions, add flomax.  PT to teach Kegel's  LOS (Days) 13 A FACE TO FACE EVALUATION WAS PERFORMED  Walker Paddack E 05/08/2012, 7:38 AM

## 2012-05-08 NOTE — Progress Notes (Signed)
Physical Therapy Session Note  Patient Details  Name: [name] MRN: [id number] Date of Birth: 07/11/1926  Today's Date: 05/08/2012 Time: 4098-1191 Time Calculation (min): 35 min  Short Term Goals: Week 1:  PT Short Term Goal 1 (Week 1): Pt will perform bed mobility with modified indepdence, using typical movement patterns. PT Short Term Goal 2 (Week 1): Pt will perform basic transfers with supervision. PT Short Term Goal 3 (Week 1): Pt will perform gait x 75'  in controlled environment, with min assist; in home environment x 50' with min assist. PT Short Term Goal 4 (Week 1): Pt will propel w/c using bil LEs x 50' with supervision, for strengthening.   PT Short Term Goal 5 (Week 1): Pt will ascend and descend 5 steps with 2 rails with min assist.     Skilled Therapeutic Interventions/Progress Updates: focus on neuro re-ed, balance, therapeutic exercise performed with LE to increase strength for functional mobility, car transfers, gait and steps.  Husband observed tx.  Car transfer with supervision, cues for technique.  Up/down 4 steps with 2 rails with supervision, cues for technique.    Neuro re-ed in standing via forced use of LLE,  manual cues, during tandem position wt shifting with L or R in front.  Gait training with RW x 230' with 2 standing rest breaks for a few seconds, with supervision, controlled env. frequent VCs for wider stance, careful initiation of turns, especially to L.  No LOB.      Therapy Documentation Precautions:  Precautions Precautions: Fall Precaution Comments: NPO, PEG Restrictions Weight Bearing Restrictions: No  Pain: Pain Assessment Pain Assessment: No/denies pain Pain Score: 0-No pain    Locomotion : Ambulation Ambulation/Gait Assistance: 5: Supervision        See FIM for current functional status  Therapy/Group: Individual Therapy  Shamonica Schadt 05/08/2012, 3:54 PM

## 2012-05-08 NOTE — Progress Notes (Signed)
Speech Language Pathology Daily Session Note  Patient Details  Name: [name] MRN: [id number] Date of Birth: December 22, 1926  Today's Date: 05/08/2012 Time: 1330-1430 Time Calculation (min): 60 min  Short Term Goals: Week 2: SLP Short Term Goal 1 (Week 2): Patient will demonstrate improved ability to consume ice chips with dereased overt s/s of aspiration to determine readiness for repeat objective swallow evaluation. SLP Short Term Goal 2 (Week 2): Patient will consume trials of puree with 5 hard effortful swallows and no overt s/s of penetration or aspiration.  SLP Short Term Goal 2 - Progress (Week 2): Progressing toward goal SLP Short Term Goal 3 (Week 2): Patient will complete oral care with modified independence. SLP Short Term Goal 3 - Progress (Week 2): Progressing toward goal  Skilled Therapeutic Interventions: Session focused on dysphagia therapy utilizing NMES in the 3b position with 2 electrodes (due to a small neck) at 9 mA to facilitate increased UES opening.  Patient performed oral hygiene with supervision verbal cues; Consumed 1/2 teaspoon size bolus of puree with a throat clear and 1 extra swallow; Patient also consumed trials of Honey-thick liquid via cup sip with consistent throat clears and cues to cough hard to remove suspected penetration.  Patient required max assist cues to recall pharyngeal and tongue base strengthening exercises that were introduced yesterday, SLP provided with handout to facilitate recall.    Recommend: Continue with current plan of care and repeat MBSS Friday.  Daily Session Precautions/Restrictions    FIM:  Comprehension Comprehension Mode: Auditory Comprehension: 6-Follows complex conversation/direction: With extra time/assistive device Expression Expression Mode: Verbal Expression: 5-Expresses complex 90% of the time/cues < 10% of the time Social Interaction Social Interaction: 6-Interacts appropriately with others with medication or extra  time (anti-anxiety, antidepressant). Problem Solving Problem Solving: 5-Solves basic 90% of the time/requires cueing < 10% of the time Memory Memory: 5-Recognizes or recalls 90% of the time/requires cueing < 10% of the time FIM - Eating Eating Activity: 1: Helper performs IV, parenteral, or tube feeding General    Pain Pain Assessment Pain Assessment: No/denies pain Pain Score: 0-No pain  Therapy/Group: Individual Therapy  [name], CCC-SLP [phone]  [name] 05/08/2012, 2:07 PM

## 2012-05-08 NOTE — Progress Notes (Signed)
Physical Therapy Session Note  Patient Details  Name: [name] MRN: [id number] Date of Birth: May 22, 1926  Today's Date: 05/08/2012 Time: 4098-1191 Time Calculation (min): 32 min  Short Term Goals: Week 2:     Skilled Therapeutic Interventions/Progress Updates:      Therapy Documentation Precautions:  Precautions Precautions: Fall Precaution Comments: NPO ; PEG Restrictions Weight Bearing Restrictions: No Vital Signs: Therapy Vitals Temp: 97.3 F (36.3 C) Temp src: Oral Pulse Rate: 100  Resp: 20  BP: 105/63 mmHg Patient Position, if appropriate: Sitting Oxygen Therapy SpO2: 97 % O2 Device: None (Room air) Pain: Pain Assessment Pain Assessment: No/denies pain Pain Score: 0-No pain Mobility: sit to stand with Guarding-Supervision.  Attempted repeated sit to stands for strengthening, however pt notes incisional pain/tightness during transfers.  Ambulated outside on uneven surface with MinA with RW 80'.  Pt c/o fatigue this pm.  Amb inside with RW Guarding-Supervision 100'.  Sit to supine MinA.    See FIM for current functional status  Therapy/Group: Individual Therapy  Masaru Chamberlin, Alison Murray 05/08/2012, 3:27 PM

## 2012-05-08 NOTE — Progress Notes (Signed)
Occupational Therapy Session Note  Patient Details  Name: [name] MRN: [id number] Date of Birth: Aug 21, 1926  Today's Date: 05/08/2012 Time: 4098-1191 Time Calculation (min): 40 min  Skilled Therapeutic Interventions/Progress Updates:    Pt seen for individual OT treatment session today with focus on activity tolerance/level, balance during ADL's and selfcare tasks at sink level. Pt was SBA/steady assist toilet transfer in bathroom using grab bar, RW & elevated toilet seat from w/c. Pt continues to fatigue easily and benefits from frequent rest breaks during all ADL's secondary to overall decreased endurance & activity tolerance.  Therapy Documentation Precautions:  Precautions Precautions: Fall Precaution Comments: NPO ; PEG Restrictions Weight Bearing Restrictions: No     Pain: Pain Assessment Pain Assessment: No/denies pain Pain Score: 0-No pain ADL: ADL Eating: NPO Grooming: Supervision/safety Where Assessed-Grooming: Standing at sink Upper Body Bathing: Setup Where Assessed-Upper Body Bathing: Wheelchair;Sitting at sink Lower Body Bathing: Supervision/safety Where Assessed-Lower Body Bathing: Wheelchair;Standing at sink;Sitting at sink Upper Body Dressing: Setup Where Assessed-Upper Body Dressing: Wheelchair Lower Body Dressing: Contact guard Where Assessed-Lower Body Dressing: Standing at sink;Wheelchair Toileting: Contact guard Where Assessed-Toileting: Teacher, adult education: Minimal Dentist Method: Stand pivot;Ambulating Acupuncturist: Engineer, technical sales: Not assessed Film/video editor: Insurance underwriter Method: Designer, industrial/product: Emergency planning/management officer ADL Comments: Pt fatigues extremely easily.     See FIM for current functional status  Therapy/Group: Individual Therapy  Alm Bustard 05/08/2012, 10:41 AM

## 2012-05-08 NOTE — Progress Notes (Signed)
Social Work Patient ID: [name], female   DOB: [birth year], 78 y.o.   MRN: [id number] Met with pt and husband to inform [facility] does not foresee any beds becoming open in the next few days. Pt has given two other facilities to look into and see if they have beds.  Have faxed FL2 to facilities and will call. Pt having a MBS on Friday am.  Continue to look for bed.

## 2012-05-08 NOTE — Progress Notes (Signed)
Occupational Therapy Session Note  Patient Details  Name: [name] MRN: [id number] Date of Birth: [birth year]  Today's Date: 05/08/2012 Time: 1330-1430 Time Calculation (min): 60 min   Skilled Therapeutic Interventions/Progress Updates:    Worked on dynamic balance and endurance in room.  Had pt retrieve items from the floor with emphasis on bending knees to squat while reaching.  Needs min assist for balance and for return to standing from squat position, without use of assistive device.  Pt needing rest break after performing this task.  Also worked on standing balance with feet apart, feet apart with vision compromised, and with feet close together.  Pt with increased sway and occasional LOB when vision is compromised or when feet are positioned close together.  Also had pt stand with feet apart and work on weight shifting to turn head and look behind her.  Pt with one LOB while performing this task, requiring min assist to self correct.  Pt with HR 105-117 during session after standing.  O2 sats maintained at 96 % or better on room air with dyspnea 3/4.  Therapy Documentation Precautions:  Precautions Precautions: Fall Precaution Comments: NPO ; PEG Restrictions Weight Bearing Restrictions: No  Pain: Pain Assessment Pain Assessment: No/denies pain Pain Score: 0-No pain  Other Treatments:    See FIM for current functional status  Therapy/Group: Individual Therapy  Devlyn Parish 05/08/2012, 3:07 PM

## 2012-05-09 LAB — GLUCOSE, CAPILLARY
Glucose-Capillary: 135 mg/dL — ABNORMAL HIGH (ref 70–99)
Glucose-Capillary: 162 mg/dL — ABNORMAL HIGH (ref 70–99)
Glucose-Capillary: 170 mg/dL — ABNORMAL HIGH (ref 70–99)
Glucose-Capillary: 178 mg/dL — ABNORMAL HIGH (ref 70–99)
Glucose-Capillary: 212 mg/dL — ABNORMAL HIGH (ref 70–99)
Glucose-Capillary: 74 mg/dL (ref 70–99)

## 2012-05-09 LAB — URINALYSIS, ROUTINE W REFLEX MICROSCOPIC
Nitrite: POSITIVE — AB
Specific Gravity, Urine: 1.013 (ref 1.005–1.030)
Urobilinogen, UA: 0.2 mg/dL (ref 0.0–1.0)
pH: 7.5 (ref 5.0–8.0)

## 2012-05-09 MED ORDER — PANTOPRAZOLE SODIUM 20 MG PO TBEC
20.0000 mg | DELAYED_RELEASE_TABLET | Freq: Every day | ORAL | Status: DC
  Administered 2012-05-09: 20 mg via ORAL
  Filled 2012-05-09 (×2): qty 1

## 2012-05-09 MED ORDER — CEPHALEXIN 250 MG/5ML PO SUSR
250.0000 mg | Freq: Three times a day (TID) | ORAL | Status: DC
  Administered 2012-05-09 – 2012-05-10 (×2): 250 mg
  Filled 2012-05-09 (×6): qty 5

## 2012-05-09 MED ORDER — GLUCERNA 1.2 CAL PO LIQD
1000.0000 mL | Freq: Every day | ORAL | Status: DC
Start: 2012-05-09 — End: 2012-05-10
  Administered 2012-05-09 – 2012-05-10 (×5): 1000 mL
  Filled 2012-05-09 (×11): qty 1000

## 2012-05-09 MED ORDER — GLUCOSE 40 % PO GEL
ORAL | Status: AC
  Administered 2012-05-09: 37.5 g
  Filled 2012-05-09: qty 1

## 2012-05-09 MED ORDER — INSULIN GLARGINE 100 UNIT/ML ~~LOC~~ SOLN
15.0000 [IU] | Freq: Two times a day (BID) | SUBCUTANEOUS | Status: DC
  Administered 2012-05-09: 15 [IU] via SUBCUTANEOUS

## 2012-05-09 NOTE — Progress Notes (Signed)
Social Work Patient ID: [name], female   DOB: March 20, 1926, 78 y.o.   MRN: [id number] Met with pt to inform her bed offer from [facility] for tomorrow.  Spoke with husband And both are agreeable to this plan.  Informed Eligha Bridegroom no longer needed.  Pt to have MBS Tomorrow am and then transfer to [facility] via ambulance.  Work toward discharge tomorrow,  Team aware and Dan-PA.

## 2012-05-09 NOTE — Discharge Summary (Signed)
  Discharge summary jib # [id number]

## 2012-05-09 NOTE — Progress Notes (Signed)
Occupational Therapy Session Note  Patient Details  Name: [name] MRN: [id number] Date of Birth: 15-Feb-1926  Today's Date: 05/09/2012 Time: 4098-1191 Time Calculation (min): 42 min  Short Term Goals: Week 1:  OT Short Term Goal 1 (Week 1): Pt will transfer to toilet with supervision with or without RW. OT Short Term Goal 1 - Progress (Week 1): Progressing toward goal OT Short Term Goal 2 (Week 1): Pt will dress LB with supervision. OT Short Term Goal 2 - Progress (Week 1): Progressing toward goal OT Short Term Goal 3 (Week 1): Pt will perform simple meal prep with supervision. OT Short Term Goal 3 - Progress (Week 1): Discontinued (comment) Week 2:  OT Short Term Goal 1 (Week 2): Pt will transfer in a walk in shower with supervision. OT Short Term Goal 2 (Week 2): Pt will transfer to the toilet with supervision. OT Short Term Goal 3 (Week 2): Pt will toilet with supervision. OT Short Term Goal 4 (Week 2): Pt will bathe with supervision.      Skilled Therapeutic Interventions/Progress Updates:      Pt seen for BADL retraining of toileting, bathing at shower level, and dressing with a focus on activity tolerance and balance.  Pt demonstrated improved balance with ambulating to toilet and shower with minimal drift/ lean to left.  Pt continues to be supervision with her ADLs with frequent rest breaks.  Therapy Documentation Precautions:  Precautions Precautions: Fall Precaution Comments: NPO, PEG Restrictions Weight Bearing Restrictions: No   Pain: Pain Assessment Pain Assessment: No/denies pain Pain Score: 0-No pain  See FIM for current functional status  Therapy/Group: Individual Therapy  Shaquana Buel 05/09/2012, 10:11 AM

## 2012-05-09 NOTE — Progress Notes (Signed)
Occupational Therapy Discharge Summary  Patient Details  Name: [name] MRN: [id number] Date of Birth: September 10, 1926  Today's Date: 05/09/2012 Time: 4098-1191 Time Calculation (min): 42 min  Patient has met 7 of 7 long term goals due to improved activity tolerance, improved balance, postural control and improved awareness.  Patient to discharge at overall Supervision level.     Recommendation:  Patient will benefit from ongoing skilled OT services in skilled nursing facility setting to continue to advance functional skills in the area of BADL and Reduce care partner burden.  Equipment: No equipment provided  Reasons for discharge: treatment goals met  Patient/family agrees with progress made and goals achieved: Yes  OT Discharge Precautions/Restrictions  Precautions Precautions: Fall Precaution Comments: NPO/PEG Restrictions Weight Bearing Restrictions: No Pain Pain Assessment Pain Assessment: No/denies pain Pain Score: 0-No pain Faces Pain Scale: No hurt ADL ADL Eating: NPO Grooming: Supervision/safety Where Assessed-Grooming: Standing at sink Upper Body Bathing: Supervision/safety Where Assessed-Upper Body Bathing: Wheelchair;Sitting at sink Lower Body Bathing: Supervision/safety Where Assessed-Lower Body Bathing: Wheelchair;Standing at sink;Sitting at sink Upper Body Dressing: Setup Where Assessed-Upper Body Dressing: Wheelchair Lower Body Dressing: Supervision/safety Where Assessed-Lower Body Dressing: Standing at sink;Wheelchair Toileting: Supervision/safety Where Assessed-Toileting: Teacher, adult education: Close supervision Toilet Transfer Method: Proofreader: Engineer, technical sales: Not assessed Film/video editor: Close supervision Film/video editor Method: Designer, industrial/product: Grab bars;Transfer tub bench ADL Comments: Pt fatigues extremely easily. Vision/Perception  Vision -  History Baseline Vision: No visual deficits Praxis Praxis: Intact  Cognition Overall Cognitive Status: Appears within functional limits for tasks assessed Orientation Level: Oriented X4;Oriented to person;Oriented to place;Oriented to time;Oriented to situation Sensation Sensation Light Touch: Appears Intact Stereognosis: Appears Intact Hot/Cold: Appears Intact Proprioception: Appears Intact Coordination Gross Motor Movements are Fluid and Coordinated: Yes Fine Motor Movements are Fluid and Coordinated: Yes Motor  Motor Motor - Skilled Clinical Observations: improved midline orientation, with minimal drift and lean to left side    Trunk/Postural Assessment  Cervical Assessment Cervical Assessment: Within Functional Limits (trunk movement limited by PEG site pain) Thoracic Assessment Thoracic Assessment: Within Functional Limits Lumbar Assessment Lumbar Assessment: Within Functional Limits Postural Control Postural Control: Within Functional Limits  Balance Static Sitting Balance Static Sitting - Level of Assistance: 7: Independent Dynamic Sitting Balance Dynamic Sitting - Level of Assistance: 7: Independent Static Standing Balance Static Standing - Balance Support: Bilateral upper extremity supported Static Standing - Level of Assistance: 5: Stand by assistance Static Standing - Comment/# of Minutes: decreased stability bilateral knees Extremity/Trunk Assessment RUE Assessment RUE Assessment: Within Functional Limits LUE Assessment LUE Assessment: Within Functional Limits  See FIM for current functional status  [name] 05/09/2012, 11:38 AM

## 2012-05-09 NOTE — Progress Notes (Signed)
5/16: 1615 CBG reveals 37 blood sugar.  RN notified and reported to patient room to check pt. LOC.  Pt. AOX3; Glucose gel and 6 oz of Orange Juice given via Peg Tube.  PA notifed.  Recheck CBG in 15 min.  Continuing to monitor. 1645 CBG reveals 96 Blood sugar.  Insulin held.  Continuing to monitor.  1710 CBG reveals 170 Blood sugar.

## 2012-05-09 NOTE — Patient Care Conference (Signed)
Inpatient RehabilitationTeam Conference Note Date: 05/08/2012   Time: 11:10 AM    Patient Name: [name]      Medical Record Number: [id number]  Date of Birth: 01-25-1926 Sex: Female         Room/Bed: 4032/4032-01 Payor Info: Payor: MEDICARE  Plan: MEDICARE PART A AND B  Product Type: *No Product type*     Admitting Diagnosis: lt cva  Admit Date/Time:  04/25/2012  4:46 PM Admission Comments: No comment available   Primary Diagnosis:  Stroke Principal Problem: Stroke  Patient Active Problem List  Diagnoses Date Noted  . Stroke 04/30/2012  . CVA (cerebral infarction) 04/23/2012  . Acute respiratory failure with hypoxia 04/22/2012  . Leukocytosis 04/22/2012  . Atrial fibrillation with RVR 04/21/2012  . Diabetes mellitus 04/21/2012  . Hypertension 04/21/2012  . Hyperlipidemia 04/21/2012  . Obesity (BMI 30-39.9) 04/21/2012  . Hypothyroidism 04/21/2012  . H/O: CVA (cardiovascular accident) 04/21/2012  . Vulvar cancer, carcinoma 11/03/2011    Expected Discharge Date: Expected Discharge Date: 05/09/12  Team Members Present: Physician: Dr. Claudette Laws Nurse: Berta Minor, RN Case Manager Present: Lutricia Horsfall, RN Social Worker Present: Dossie Der, LCSW PT Present: Edman Circle, PT;Caroline Adriana Simas, PT OT Present: Bretta Bang, Verlene Mayer, OT SLP Present: Fae Pippin, SLP     Current Status/Progress Goal Weekly Team Focus  Medical   gastrostomy tube placedtolerating bolus feedings  adjust insulin based on bolus feedings  see above   Bowel/Bladder   Continent of bowel and bladder with periods of stress incontinence  Continent of bowel and bladder  Timed toileting q2 and as needed    Swallow/Nutrition/ Hydration   NPO 4-5 swallows per 1/2 teaspoon of puree with consistent throat clears following each swallow  contiue trials  NMES with dysphagia treatment   ADL's   min assist with transfes, supervision with BADLs (except for tying shoes due to  abdominal pain)  goals changed to supervision with all BADLs/  transfers due to low endurance  ADL retraining, standing balance activities, endurance activities   Mobility   min assist transfers; supervision to min assist gait x 150', min assist 3 steps with rails; w/c x 75' with supervision  LTGs were downgraded due to slow progress and poor activity tolerance  activity tolerance,  balance, neuro re-ed, mobility, strengthening   Communication             Safety/Cognition/ Behavioral Observations            Pain   PRN tylenol 325-650mg  q 4 (last used 5-10) and Norco 5-325 q6 (last used 5-10)  Pain goal less than or equal to 3  N/A   Skin   Cracked lips  No new skin breakdown  Keep lips moist, apply vaseline as needed      *See Interdisciplinary Assessment and Plan and progress notes for long and short-term goals  Barriers to Discharge: familyunable to care for patient    Possible Resolutions to Barriers:  skilled nursing placement prior to discharge to home    Discharge Planning/Teaching Needs:  Plan changed to short term NHP.  Both husband and pt agreeable and relieved with decision      Team Discussion:  PEG place and healing. Improved swallow. Voiding now, still checking PVRs.  Await SNF placement.  Revisions to Treatment Plan:     Continued Need for Acute Rehabilitation Level of Care: The patient requires daily medical management by a physician with specialized training in physical medicine and rehabilitation for the following  conditions: Daily direction of a multidisciplinary physical rehabilitation program to ensure safe treatment while eliciting the highest outcome that is of practical value to the patient.: Yes Daily medical management of patient stability for increased activity during participation in an intensive rehabilitation regime.: Yes Daily analysis of laboratory values and/or radiology reports with any subsequent need for medication adjustment of medical  intervention for : Neurological problems;Post surgical problems  [name] Dare 05/09/2012, 12:48 PM

## 2012-05-09 NOTE — Progress Notes (Signed)
Physical Therapy Note  Patient Details  Name: [name] MRN: [id number] Date of Birth: 1926/04/14 Today's Date: 05/09/2012  1100-1145 (45 minutes) individual Missed 15 minutes nausea/reflux Pain: discomfort at PEG site   Acid reflux/premedicated Focus of treatment: Gait training /endurance RW; Therapeutic activities in standing to facilitate balance reactions Treatment: Gait- room to gym (approximately 120 feet)  RW with one seated rest break; sit to stand from mat holding ball close SBA with difficulty; Standing with ball reaching forward and sideways close supervision; static standing with bilateral knee instability SBA; Standing without UE support stepping alternate stepping forward/backward one step with min assist for balance stepping with left (decreased stance time on right). Pt reports increased acid reflux with nausea/ nurse notified the patient .  1445 Pt missed 30 minute PT session secondary to nausea/acid reflux; nursing aware.  Lavone Weisel,JIM 05/09/2012, 11:25 AM

## 2012-05-09 NOTE — Progress Notes (Signed)
Speech Language Pathology Daily Session Note  Patient Details  Name: [name] MRN: [id number] Date of Birth: 1926-02-05  Today's Date: 05/09/2012 Time: 1340-1415 Time Calculation (min): 35 min  Short Term Goals: Week 2: SLP Short Term Goal 1 (Week 2): Patient will demonstrate improved ability to consume ice chips with dereased overt s/s of aspiration to determine readiness for repeat objective swallow evaluation. SLP Short Term Goal 2 (Week 2): Patient will consume trials of puree with 5 hard effortful swallows and no overt s/s of penetration or aspiration.  SLP Short Term Goal 2 - Progress (Week 2): Progressing toward goal SLP Short Term Goal 3 (Week 2): Patient will complete oral care with modified independence. SLP Short Term Goal 3 - Progress (Week 2): Progressing toward goal  Skilled Therapeutic Interventions: Session shortened at beginning of session due to patient meeting with coordinator for SNF placement and ended early due to significant discomfort and reflux that was occuring with trials.  SLP facilitated dysphagia treatment with supervision set up to perform oral care and minimal assist sematic cues for portion control and pacing while self feeding trials of thin water via cup.  With throat clears following each sip and coughs in 2/10 trails, then reflux episode occurred and patient self suctioned and RN was notified.  Patient then requested to end session and wanted to rest.     Daily Session Precautions/Restrictions  Precautions Precautions: Fall Precaution Comments: NPO/PEG Restrictions Weight Bearing Restrictions: No FIM:  Comprehension Comprehension Mode: Auditory Comprehension: 6-Follows complex conversation/direction: With extra time/assistive device Expression Expression Mode: Verbal Expression: 6-Expresses complex ideas: With extra time/assistive device Social Interaction Social Interaction: 6-Interacts appropriately with others with medication or extra time  (anti-anxiety, antidepressant). Problem Solving Problem Solving: 6-Solves complex problems: With extra time Memory Memory: 5-Recognizes or recalls 90% of the time/requires cueing < 10% of the time General  Amount of Missed SLP Time (min): 25 Minutes Missed Time Reason: Patient ill (Comment);Unavailable (Comment) Pain Pain Assessment Pain Assessment: 0-10 (reflux discomfort) Pain Score:   4  Therapy/Group: Individual Therapy  [name], CCC-SLP [phone]  [employer] 05/09/2012, 2:33 PM

## 2012-05-09 NOTE — Progress Notes (Signed)
Patient ID: [name], female   DOB: 1926/07/14, 78 y.o.   MRN: [id number] Subjective/Complaints: Heartburn symptoms with hiccups Objective: Vital Signs: Blood pressure 137/83, pulse 108, temperature 97.1 F (36.2 C), temperature source Oral, resp. rate 19, height 5\' 3"  (1.6 m), weight 57.1 kg (125 lb 14.1 oz), SpO2 90.00%. No results found. Results for orders placed during the hospital encounter of 04/25/12 (from the past 72 hour(s))  GLUCOSE, CAPILLARY     Status: Abnormal   Collection Time   05/06/12  7:20 AM      Component Value Range Comment   Glucose-Capillary 245 (*) 70 - 99 (mg/dL)    Comment 1 Notify RN     GLUCOSE, CAPILLARY     Status: Abnormal   Collection Time   05/06/12 11:33 AM      Component Value Range Comment   Glucose-Capillary 156 (*) 70 - 99 (mg/dL)    Comment 1 Notify RN     URINALYSIS, ROUTINE W REFLEX MICROSCOPIC     Status: Normal   Collection Time   05/06/12  1:35 PM      Component Value Range Comment   Color, Urine YELLOW  YELLOW     APPearance CLEAR  CLEAR     Specific Gravity, Urine 1.018  1.005 - 1.030     pH 6.5  5.0 - 8.0     Glucose, UA NEGATIVE  NEGATIVE (mg/dL)    Hgb urine dipstick NEGATIVE  NEGATIVE     Bilirubin Urine NEGATIVE  NEGATIVE     Ketones, ur NEGATIVE  NEGATIVE (mg/dL)    Protein, ur NEGATIVE  NEGATIVE (mg/dL)    Urobilinogen, UA 1.0  0.0 - 1.0 (mg/dL)    Nitrite NEGATIVE  NEGATIVE     Leukocytes, UA NEGATIVE  NEGATIVE  MICROSCOPIC NOT DONE ON URINES WITH NEGATIVE PROTEIN, BLOOD, LEUKOCYTES, NITRITE, OR GLUCOSE <1000 mg/dL.  GLUCOSE, CAPILLARY     Status: Abnormal   Collection Time   05/06/12  4:10 PM      Component Value Range Comment   Glucose-Capillary 157 (*) 70 - 99 (mg/dL)   GLUCOSE, CAPILLARY     Status: Abnormal   Collection Time   05/06/12  9:12 PM      Component Value Range Comment   Glucose-Capillary 236 (*) 70 - 99 (mg/dL)    Comment 1 Notify RN     GLUCOSE, CAPILLARY     Status: Abnormal   Collection Time   05/07/12 12:14 AM      Component Value Range Comment   Glucose-Capillary 203 (*) 70 - 99 (mg/dL)    Comment 1 Notify RN     GLUCOSE, CAPILLARY     Status: Abnormal   Collection Time   05/07/12  2:17 AM      Component Value Range Comment   Glucose-Capillary 182 (*) 70 - 99 (mg/dL)   GLUCOSE, CAPILLARY     Status: Abnormal   Collection Time   05/07/12  5:21 AM      Component Value Range Comment   Glucose-Capillary 247 (*) 70 - 99 (mg/dL)    Comment 1 Notify RN     GLUCOSE, CAPILLARY     Status: Abnormal   Collection Time   05/07/12  7:11 AM      Component Value Range Comment   Glucose-Capillary 226 (*) 70 - 99 (mg/dL)    Comment 1 Notify RN     GLUCOSE, CAPILLARY     Status: Abnormal   Collection Time  05/07/12 11:25 AM      Component Value Range Comment   Glucose-Capillary 162 (*) 70 - 99 (mg/dL)    Comment 1 Notify RN     GLUCOSE, CAPILLARY     Status: Abnormal   Collection Time   05/07/12  4:24 PM      Component Value Range Comment   Glucose-Capillary 165 (*) 70 - 99 (mg/dL)   GLUCOSE, CAPILLARY     Status: Abnormal   Collection Time   05/07/12  8:04 PM      Component Value Range Comment   Glucose-Capillary 234 (*) 70 - 99 (mg/dL)   GLUCOSE, CAPILLARY     Status: Abnormal   Collection Time   05/07/12 11:55 PM      Component Value Range Comment   Glucose-Capillary 132 (*) 70 - 99 (mg/dL)    Comment 1 Notify RN     GLUCOSE, CAPILLARY     Status: Normal   Collection Time   05/08/12  4:17 AM      Component Value Range Comment   Glucose-Capillary 74  70 - 99 (mg/dL)   GLUCOSE, CAPILLARY     Status: Abnormal   Collection Time   05/08/12  7:28 AM      Component Value Range Comment   Glucose-Capillary 194 (*) 70 - 99 (mg/dL)    Comment 1 Notify RN     GLUCOSE, CAPILLARY     Status: Abnormal   Collection Time   05/08/12 11:47 AM      Component Value Range Comment   Glucose-Capillary 194 (*) 70 - 99 (mg/dL)    Comment 1 Notify RN     GLUCOSE, CAPILLARY     Status: Abnormal    Collection Time   05/08/12  4:54 PM      Component Value Range Comment   Glucose-Capillary 179 (*) 70 - 99 (mg/dL)   GLUCOSE, CAPILLARY     Status: Abnormal   Collection Time   05/08/12  9:05 PM      Component Value Range Comment   Glucose-Capillary 146 (*) 70 - 99 (mg/dL)    Comment 1 Notify RN     GLUCOSE, CAPILLARY     Status: Abnormal   Collection Time   05/09/12 12:43 AM      Component Value Range Comment   Glucose-Capillary 135 (*) 70 - 99 (mg/dL)   GLUCOSE, CAPILLARY     Status: Normal   Collection Time   05/09/12  4:32 AM      Component Value Range Comment   Glucose-Capillary 74  70 - 99 (mg/dL)    Comment 1 Notify RN        HEENT: normal Cardio: RRR Resp: CTA B/L GI: BS positive Extremity:  No Edema Skin:   Bruise RLQ and LLQ at lovenox injection sites Neuro: Alert/Oriented, Dysarthric and Other truncal ataxia Musc/Skel:  Normal   Assessment/Plan: 1. 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 Physiatrist is providing close team supervision and 24 hour management of active medical problems listed below. Physiatrist and rehab team continue to assess barriers to discharge/monitor patient progress toward functional and medical goals. Team conference FIM: FIM - Bathing Bathing Steps Patient Completed: Chest;Right Arm;Left Arm;Abdomen;Front perineal area;Buttocks;Right upper leg;Left upper leg;Right lower leg (including foot);Left lower leg (including foot) Bathing: 5: Supervision: Safety issues/verbal cues  FIM - Upper Body Dressing/Undressing Upper body dressing/undressing steps  patient completed: Thread/unthread right bra strap;Thread/unthread left bra strap;Hook/unhook bra;Thread/unthread right sleeve of pullover shirt/dresss;Thread/unthread left sleeve of pullover shirt/dress;Put head through opening of pull over  shirt/dress;Pull shirt over trunk Upper body dressing/undressing: 5: Supervision: Safety issues/verbal cues FIM - Lower Body Dressing/Undressing Lower body dressing/undressing steps patient completed: Thread/unthread right underwear leg;Thread/unthread left underwear leg;Thread/unthread right pants leg;Pull underwear up/down;Thread/unthread left pants leg;Pull pants up/down;Don/Doff right sock;Don/Doff left sock;Don/Doff left shoe;Fasten/unfasten right shoe;Fasten/unfasten left shoe;Don/Doff right shoe Lower body dressing/undressing: 4: Min-Patient completed 75 plus % of tasks (Min A don shoes & fasten, increased time, rest breaks)  FIM - Toileting Toileting steps completed by patient: Adjust clothing prior to toileting;Performs perineal hygiene;Adjust clothing after toileting Toileting Assistive Devices: Grab bar or rail for support Toileting: 5: Supervision: Safety issues/verbal cues  FIM - Diplomatic Services operational officer Devices: Elevated toilet seat;Grab bars;Walker Toilet Transfers: 4-To toilet/BSC: Min A (steadying Pt. > 75%);4-From toilet/BSC: Min A (steadying Pt. > 75%)  FIM - Bed/Chair Transfer Bed/Chair Transfer Assistive Devices: Therapist, occupational: 6: Sit > Supine: No assist;5: Bed > Chair or W/C: Supervision (verbal cues/safety issues);5: Chair or W/C > Bed: Supervision (verbal cues/safety issues)  FIM - Locomotion: Wheelchair Distance: 60 Locomotion: Wheelchair: 2: Travels 50 - 149 ft with supervision, cueing or coaxing FIM - Locomotion: Ambulation Locomotion: Ambulation Assistive Devices: Designer, industrial/product Ambulation/Gait Assistance: 5: Supervision Locomotion: Ambulation: 5: Travels 150 ft or more with supervision/safety issues  Comprehension Comprehension Mode: Auditory Comprehension: 6-Follows complex conversation/direction: With extra time/assistive device  Expression Expression Mode: Verbal Expression: 6-Expresses complex ideas: With extra  time/assistive device  Social Interaction Social Interaction: 6-Interacts appropriately with others with medication or extra time (anti-anxiety, antidepressant).  Problem Solving Problem Solving: 6-Solves complex problems: With extra time  Memory Memory Assistive Devices: Memory book Memory: 5-Recognizes or recalls 90% of the time/requires cueing < 10% of the time  2. Anticoagulation/DVT prophylaxis with Pharmaceutical: Lovenox 3. Pain Management: acetominophen 4.  DM-uncontrolled  Slowly titrating lantus change to BID, uses levimir at home total long acting 48 Unit per day, will increase lantus to 25 Unit bid,  Now on bolus feeds 5 x per day use novalog with bolus feeds       5.  Urinary retention + stress incont doing better with emptying, avoid urecholine due to problems handling oral secretions, add flomax.  PT to teach Kegel's 6.  GERD  Start PPI               LOS (Days) 14 A FACE TO FACE EVALUATION WAS PERFORMED  [name] 05/09/2012, 7:17 AM

## 2012-05-09 NOTE — Progress Notes (Addendum)
Nutrition Follow-up  Pt to have MBSS on Friday. RN reports pt with heartburn symptoms, vomited one of the boluses yesterday.  Protonix started.  Diet Order:  NPO  TF: Glucerna 1.2 five times daily. Each can contains 285 kcal, 14.2 grams protein, and 27.1 grams carbohydrate. 30 ml Prostat via tube daily. This regimen provides: 1525 kcal, 86 grams protein, 136 grams carbohydrate, and 960 ml free water.  Meds: Scheduled Meds:   . sodium chloride   Intravenous Once  . aspirin  81 mg Oral Daily  . atorvastatin  10 mg Per Tube q1800  . chlorhexidine  15 mL Mouth Rinse BID  . clopidogrel  75 mg Oral Q breakfast  . diltiazem  60 mg Oral Q6H  . feeding supplement (GLUCERNA 1.2 CAL)  1,000 mL Per Tube 5 X Daily  . feeding supplement  30 mL Per Tube Daily  . insulin aspart  0-20 Units Subcutaneous Q4H  . insulin glargine  25 Units Subcutaneous BID  . irbesartan  150 mg Oral Daily  . levothyroxine  100 mcg Per Tube Q0600  . pantoprazole  20 mg Oral Q1200  . potassium chloride  10 mEq Per Tube BID  . Tamsulosin HCl  0.4 mg Oral QPC supper  . DISCONTD: feeding supplement (GLUCERNA 1.2 CAL)  237 mL Per Tube 5 X Daily   Continuous Infusions:  PRN Meds:.acetaminophen, alum & mag hydroxide-simeth, HYDROcodone-acetaminophen, ondansetron (ZOFRAN) IV, ondansetron, phenol, sodium chloride  Labs:  CMP     Component Value Date/Time   NA 140 05/02/2012 0530   K 4.3 05/02/2012 0530   CL 100 05/02/2012 0530   CO2 29 05/02/2012 0530   GLUCOSE 188* 05/02/2012 0530   BUN 43* 05/02/2012 0530   CREATININE 1.33* 05/02/2012 0530   CALCIUM 9.6 05/02/2012 0530   PROT 6.7 04/26/2012 0548   ALBUMIN 3.2* 04/26/2012 0548   AST 14 04/26/2012 0548   ALT 12 04/26/2012 0548   ALKPHOS 71 04/26/2012 0548   BILITOT 0.4 04/26/2012 0548   GFRNONAA 36* 05/02/2012 0530   GFRAA 42* 05/02/2012 0530   CBG (last 3)   Basename 05/09/12 0728 05/09/12 0432 05/09/12 0043  GLUCAP 178* 74 135*    Intake/Output Summary (Last 24 hours) at 05/09/12  1030 Last data filed at 05/08/12 2000  Gross per 24 hour  Intake    700 ml  Output    182 ml  Net    518 ml   Weight Status:  57.1 kg - stable  Estimated needs:  1400 - 1600 kcal, 65 - 75 grams protein  Nutrition Dx:  Inadequate oral intake - ongoing.  Goal:  EN to meet >90% of estimated nutrition needs, met.  Intervention:   1. Discussed TF regimen with RN and suggested using kangaroo pump to administer TF boluses to ensure bolus is delivered slowly. 2. RD to continue to follow nutrition care plan  Monitor:  EN tolerance, weights, labs, I/O's, CBGs  Adair Laundry Pager #:  [phone]

## 2012-05-09 NOTE — Discharge Summary (Signed)
[name], KREUTZER NO.:  [id number]  MEDICAL RECORD NO.:  [id number]  LOCATION:  4032                         FACILITY:  [facility]  PHYSICIAN:  Erick Colace, M.D.DATE OF BIRTH:  Jun 22, 1926  DATE OF ADMISSION:  04/25/2012 DATE OF DISCHARGE:                              DISCHARGE SUMMARY   DISCHARGE DIAGNOSES: 1. Left posterior circulation infarct. 2. Subcutaneous Lovenox for deep venous thrombosis prophylaxis. 3. Dysphagia - status post gastrostomy tube, May 02, 2012. 4. Atrial fibrillation. 5. Hypertension. 6. Diabetes mellitus. 7. Hypothyroidism. 8. History of vulvar cancer. 9. Hyperlipidemia. 10.UTI.Empiric keflex HISTORY OF PRESENT ILLNESS:  This is an 78 year old right-handed white female with history of hypertension, diabetes mellitus as well as right brain stroke in 2001.  The patient with recent radical vulvectomy 8 weeks ago for recurrent vulvar cancer.  She is currently not undergoing chemo or radiation therapy.  Admitted on April 21, 2012 after episode of nausea, vomiting as well as a fall when she attempted to get out of bed. Her husband called 911 and she was brought to the emergency room.  In the emergency room, she was found to have atrial fibrillation with RVR. MRI of the brain showed moderate-size acute nonhemorrhagic infarct in the inferior left cerebellum and posterior lateral aspect of the left medulla.  Echocardiogram with ejection fraction of 55-60% without embolus and mild aortic stenosis.  Carotid Dopplers with no ICA stenosis.  Neurology consulted and placed on aspirin and Lovenox therapy.  Cardiology, Dr. Anne Fu, followed for atrial fibrillation, presently advised to continue aspirin therapy until able to take p.o. She was presently n.p.o. after modified barium swallow with plan to begin Pradaxa versus Plavix.  She was admitted for comprehensive rehab program.  PAST MEDICAL HISTORY:  See discharge diagnoses.  SOCIAL  HISTORY:  She lives with her husband.  FUNCTIONAL HISTORY:  Prior to admission was independent.  She does not drive.  FUNCTIONAL STATUS:  Upon admission to rehab services was moderate assist for bed mobility and transfers, ambulation not tested.  PHYSICAL EXAMINATION:  VITAL SIGNS:  Blood pressure 105/63, pulse 88, respirations 19, temperature 97.1. GENERAL:  This was an alert female, in no acute distress.  Nasogastric tube in place.  She was mildly dysarthric, but fully intelligible.  She is oriented x3 and follows commands with noted left limb ataxia. LUNGS:  Clear to auscultation. CARDIAC:  Rate controlled. ABDOMEN:  Soft, nontender with good bowel sounds.  REHABILITATION HOSPITAL COURSE:  The patient was admitted to inpatient rehab services with therapies initiated on a 3-hour daily basis consisting of physical, occupational therapy as well as speech therapy with 24-hour rehabilitation nursing.  The following issues were addressed during the patient's rehabilitation stay.  Pertaining to Mrs. Takaki, left posterior circulation infarct remained stable.  She continued with therapies as advised.  Aspirin ongoing with the addition of Plavix. She had been maintained on subcutaneous Lovenox for DVT prophylaxis. Initially with nasogastric tube secondary to dysphagia, ultimately a gastrostomy tube was placed for nutritional support, May 02, 2012.  Plan was for followup modified barium swallow on the morning of May 10, 2012 to consider p.o. diet.  Cardiac rate remained controlled  on Cardizem. No chest pain or shortness of breath.  Blood pressure ongoing with no orthostatic hypotension.  She did have a history of diabetes mellitus with hemoglobin A1c of 7.5.  She remained on insulin therapy.  Hormone supplement ongoing for hypothyroidism.  Noted history of vulvar cancer with recent radical vulvectomy 8 weeks ago.  No current plan for radiation or chemotherapy, and she would follow up with her  oncologist as directed. Urinalysis study 05/09/2012 with positive nitrite culture sensitivities pending placed on empiric Keflex 05/09/2012. The patient received weekly collaborative interdisciplinary team conferences to discuss estimated length of stay, family teaching, and any barriers to her discharge.  She was continent of bowel and bladder other than occasional stress incontinence.  Quad steady assist for decreased balance, somewhat impulsive with movement. Minimal assist overall for transfers.  Minimum-to-moderate assist ambulate 25 feet and minimal assist for stairs with fatigue factors. Her husband was somewhat limited as well as a daughter in the area.  It was felt skilled nursing facility was needed with bed becoming available on May 10, 2012 and discharge taking place at that time.  DISCHARGE MEDICATIONS: 1. Aspirin 81 mg daily. 2. Lipitor 10 mg daily. 3. Plavix 75 mg daily. 4. Cardizem 60 mg every 6 hours. 5. Norco 1 tablet every 6 hours as needed pain. 6. Lantus insulin 15 units subcutaneously twice daily. 7. Avapro 150 mg daily. 8. Synthroid 100 mcg daily. 9. Protonix 20 mg daily. 10.Potassium chloride 10 mEq twice daily. 11.Flomax 0.4 mg every evening. 12.Keflex 250 every 8 hours DIET:  Her diet was currently Glucerna 1.2 calorie at 237 mL 5 times daily.   SPECIAL INSTRUCTIONS:  Continue therapies as advised.  She should follow up with Dr. [name] for medical management.  Dr. Claudette Laws, appointment to be made for outpatient rehab appointment and Dr. Delia Heady, Neurology Services.  Her gastrostomy tube was placed by Interventional Radiology at [facility]. If the patient is clear for by mouth diet M.D. Will need to make further adjustments to her diabetic regimen    [name], P.A.   ______________________________ Erick Colace, M.D.    DA/MEDQ  D:  05/09/2012  T:  05/09/2012  Job:  [id number]  cc:   [name], M.D. Pramod P.  Pearlean Brownie, MD

## 2012-05-10 ENCOUNTER — Inpatient Hospital Stay (HOSPITAL_COMMUNITY): Payer: Medicare Other

## 2012-05-10 DIAGNOSIS — I69993 Ataxia following unspecified cerebrovascular disease: Secondary | ICD-10-CM

## 2012-05-10 DIAGNOSIS — Z5189 Encounter for other specified aftercare: Secondary | ICD-10-CM

## 2012-05-10 DIAGNOSIS — I69991 Dysphagia following unspecified cerebrovascular disease: Secondary | ICD-10-CM

## 2012-05-10 LAB — BASIC METABOLIC PANEL
BUN: 52 mg/dL — ABNORMAL HIGH (ref 6–23)
CO2: 28 mEq/L (ref 19–32)
CO2: 26 mEq/L (ref 19–32)
Chloride: 97 mEq/L (ref 96–112)
Creatinine, Ser: 1.68 mg/dL — ABNORMAL HIGH (ref 0.50–1.10)
GFR calc non Af Amer: 26 mL/min — ABNORMAL LOW (ref >90)
Glucose, Bld: 183 mg/dL — ABNORMAL HIGH (ref 70–99)
Potassium: 4.4 mEq/L (ref 3.5–5.1)
Sodium: 135 mEq/L (ref 135–145)

## 2012-05-10 LAB — GLUCOSE, CAPILLARY: Glucose-Capillary: 162 mg/dL — ABNORMAL HIGH (ref 70–99)

## 2012-05-10 MED ORDER — PANTOPRAZOLE SODIUM 40 MG PO PACK
20.0000 mg | PACK | Freq: Every day | ORAL | Status: DC
  Filled 2012-05-10: qty 20

## 2012-05-10 MED ORDER — INSULIN GLARGINE 100 UNIT/ML ~~LOC~~ SOLN
20.0000 [IU] | Freq: Two times a day (BID) | SUBCUTANEOUS | Status: DC
  Administered 2012-05-10: 20 [IU] via SUBCUTANEOUS

## 2012-05-10 NOTE — Progress Notes (Signed)
Social Work Discharge Note Discharge Note  The overall goal for the admission was met for:   Discharge location: Yes-[facility]  Length of Stay: Yes-15 DAYS  Discharge activity level: Yes-MIN LEVEL  Home/community participation: Yes  Services provided included: MD, RD, PT, OT, SLP, RN, CM, TR, Pharmacy and SW  Financial Services: Medicare and Private Insurance: [employer]-SECONDARY  Follow-up services arranged: Other: SHORT TERM NHP  Comments (or additional information):PT AND HUSBAND FEEL NEEDS LONGER IN REHAB. EVENTUAL PLAN IS TO RETURN HOME WHEN HIGHER LEVEL  Patient/Family verbalized understanding of follow-up arrangements: Yes  Individual responsible for coordination of the follow-up plan: RUSSELL-HUSBAND  Confirmed correct DME delivered: Lucy Chris 05/10/2012    Lucy Chris

## 2012-05-10 NOTE — Procedures (Signed)
Objective Swallowing Evaluation: Modified Barium Swallowing Study  Patient Details  Name: [name] MRN: [id number] Date of Birth: [birth year]  Today's Date: 05/10/2012 Time: 7829-5621 Time Calculation (min): 25 min  Past Medical History:  Past Medical History  Diagnosis Date  . Diabetes mellitus   . Hypertension   . Hyperlipidemia   . Obesity   . Hypothyroidism   . Vulvar cancer, carcinoma 11/03/2011  . Stroke 05/2000    right brain CVA pre H&P 2001  . Atrial fibrillation    Past Surgical History:  Past Surgical History  Procedure Date  . Vulvar lesion removal 2012  . Breast surgery     Lumpectomy  . Dilation and curettage of uterus   . Cystourethroplasty / ureteroneocystostomy   . Radical vulvectomy   . Lymphadenectomy 2007    R inguinal femoral  . Radical vulvectomy 02/27/12  . Vulvar lesion removal 02/27/2012    Procedure: VULVAR LESION;  Surgeon: Jeannette Corpus, MD;  Location: [facility];  Service: Gynecology;  Laterality: N/A;   HPI:  78 year old female who approximately 8 weeks ago underwent radical vulvectomy for recurrent vulvar cancer. Currently she's not undergoing chemotherapy or radiation therapy. Over the past 8 weeks patient has mostly been sedentary and bedbound per family mostly due to indwelling Foley. The Foley was removed approximately 5 days ago. She has been up in the chair a few times since. She has not had a physical therapy. Admitted 4/28 with severe nausea and vomiting, no hematemesis, s/p fall on the floor while attempting to utilize commode. In the ER she was found to have A. fib with RVR. MBSS completed 04/22/12 with significant aspiraiton and pyriform sinus residue; Transferred to CIR 04/25/12 with NPO and PEG tube placed 5/10; paitent has demonstrated functional improvment during last two sessions and SLP felt an objective study was warranted to reassess for silent aspiration.       Recommendation/Prognosis  Clinical Impression Dysphagia  Diagnosis: Moderate pharyngeal phase dysphagia (? secondary esophageal component) Clinical impression: Patient present with a moderate oropharyngeal dysphagia characterized by  decreased base of tongue retraction and hyolaryngeal excursion, resulting in vallecular and pyriform sinus residue.  Residue was less with thin consistency liquids and less dense solids.  Pureed solids not aspirated; however, patient unable to clear vallecular residue despite multiple swallows and cues for a chin tuck.  Single sips of thin liuqids with significant valleculalr residue resulted in deep flash penetration which exited airway upon swallow.  Recommend initiation of puree diet with thin liquids and multiple swallows per bite/sip and full staff supervision.   Swallow Evaluation Recommendations Diet Recommendations: Dysphagia 1 (Puree);Thin liquid Liquid Administration via: Cup Medication Administration: Via alternative means Supervision: Patient able to self feed;Full supervision/cueing for compensatory strategies Compensations: Slow rate;Small sips/bites;Multiple dry swallows after each bite/sip Postural Changes and/or Swallow Maneuvers: Out of bed for meals;Upright 30-60 min after meal;Seated upright 90 degrees Oral Care Recommendations: Oral care BID Follow up Recommendations: Skilled Nursing facility Prognosis Prognosis for Safe Diet Advancement: Good Individuals Consulted Consulted and Agree with Results and Recommendations: Patient  SLP Assessment/Plan Dysphagia Diagnosis: Moderate pharyngeal phase dysphagia (? secondary esophageal component) Clinical impression: Patient present with a moderate oropharyngeal dysphagia characterized by  decreased base of tongue retraction and hyolaryngeal excursion, resulting in vallecular and pyriform sinus residue.  Residue was less with thin consistency liquids and less dense solids.  Pureed solids not aspirated,; however, patient unable to clear vallecular residue despite  multiple swallows and cues for a chin tuck.  Single sips of thin liuqids with significant valleculalr residue resulted in deep flash penetration which exited airway upon swallow.  Recommend initiation of puree diet with thin liquids and multiple swallows per bite/sip and full staff supervision.    SLP Goals   Deferred to SLP at next venue of care  General:  HPI: 78 year old female who approximately 8 weeks ago underwent radical vulvectomy for recurrent vulvar cancer. Currently she's not undergoing chemotherapy or radiation therapy. Over the past 8 weeks patient has mostly been sedentary and bedbound per family mostly due to indwelling Foley. The Foley was removed approximately 5 days ago. She has been up in the chair a few times since. She has not had a physical therapy. Admitted 4/28 with severe nausea and vomiting, no hematemesis, s/p fall on the floor while attempting to utilize commode. In the ER she was found to have A. fib with RVR. MBSS completed 04/22/12 with significant aspiraiton and pyriform sinus residue; Transferred to CIR 04/25/12 with NPO and PEG tube placed 5/10; paitent has demonstrated functional improvment during last two sessions and SLP felt an objective study was warranted to reassess for silent aspiration.   Type of Study: Modified Barium Swallowing Study Previous Swallow Assessment: MBSS 04/12/12 Diet Prior to this Study: NPO Temperature Spikes Noted: No Respiratory Status: Room air History of Intubation: No Behavior/Cognition: Alert;Cooperative;Pleasant mood Oral Cavity - Dentition: Dentures, top (no bottom dentition) Oral Motor / Sensory Function: Impaired - see Bedside swallow eval Vision: Functional for self-feeding Patient Positioning: Upright in chair Baseline Vocal Quality: Clear Volitional Cough: Strong Volitional Swallow: Able to elicit Anatomy: Other (Comment) (? mild osteophytes along cervical spine; MD not present) Pharyngeal Secretions: Not observed secondary  MBS  Reason for Referral:  Assess for silent aspiration  Oral Phase Oral Preparation/Oral Phase Oral Phase: WFL Pharyngeal Phase  Pharyngeal Phase Pharyngeal Phase: Impaired Pharyngeal - Nectar Pharyngeal - Nectar Teaspoon: Not tested Pharyngeal - Nectar Cup: Pharyngeal residue - pyriform;Pharyngeal residue - valleculae;Reduced epiglottic inversion;Reduced laryngeal elevation;Reduced tongue base retraction (cued extra swallow effectively reduced) Penetration/Aspiration details (nectar cup): Material does not enter airway Pharyngeal - Thin Pharyngeal - Thin Cup: Pharyngeal residue - valleculae;Pharyngeal residue - pyriform;Penetration/Aspiration before swallow;Compensatory strategies attempted (Comment);Reduced epiglottic inversion;Reduced laryngeal elevation;Reduced tongue base retraction (cued extra swallow effective at reducing) Penetration/Aspiration details (thin cup): Material enters airway, remains ABOVE vocal cords then ejected out (deep flast penetration occured when vallecular reside presen) Pharyngeal - Solids Pharyngeal - Puree: Pharyngeal residue - pyriform;Pharyngeal residue - valleculae;Reduced epiglottic inversion;Reduced laryngeal elevation;Reduced tongue base retraction Penetration/Aspiration details (puree): Material does not enter airway Pharyngeal - Regular: Reduced epiglottic inversion;Reduced laryngeal elevation;Reduced tongue base retraction;Pharyngeal residue - valleculae;Pharyngeal residue - pyriform (more significant residue when compared to puree) Cervical Esophageal Phase  Cervical Esophageal Phase Cervical Esophageal Phase: Impaired Cervical Esophageal Phase - Nectar Nectar Teaspoon: Not tested Nectar Cup: Reduced cricopharyngeal relaxation Cervical Esophageal Phase - Thin Thin Cup: Reduced cricopharyngeal relaxation Cervical Esophageal Phase - Solids Puree: Reduced cricopharyngeal relaxation Regular: Reduced cricopharyngeal relaxation  [name], CCC-SLP [phone]  Colandra Ohanian 05/10/2012, 10:47 AM

## 2012-05-10 NOTE — Progress Notes (Addendum)
Physical Therapy Discharge Summary and session Note  Patient Details  Name: [name] MRN: [id number] Date of Birth: Nov 23, 1926  Today's Date: 05/10/2012 Time: 6010-9323 Time Calculation (min): 53 min  Patient has met 6 of 6 (adjusted) long term goals due to improved activity tolerance, improved balance, improved postural control, increased strength and functional use of  left lower extremity.  Patient to discharge at an ambulatory level Supervision.   Patient's care partner has not been trained yet, as pt is discharging to SNF.   Husband is not comfortable with managing pt's PEG feedings and meds through PEG.  Due to pt's recent CA surgery and limited mobility after that, coupled with new CVA and former CVA, pt had significantly limited activity tolerance and progressed very slowly.  She has good potential to continue progress in balance and gait.  Reasons goals not met: n/a  Recommendation:  Patient will benefit from ongoing skilled PT services in skilled nursing facility setting to continue to advance safe functional mobility, address ongoing impairments in strength, balance, midline orientation, muscle timing and sequencing, and minimize fall risk.  Equipment: No equipment provided  Reasons for discharge: discharge from hospital  Patient/family agrees with progress made and goals achieved: Yes  PT Discharge Precautions/Restrictions Precautions Precautions: Fall Precaution Comments: NPO PEG   Pain Pain Assessment Pain Assessment: No/denies pain Vision/Perception     Cognition Overall Cognitive Status: Appears within functional limits for tasks assessed Orientation Level: Oriented X4 Sensation Sensation Light Touch: Appears Intact Proprioception: Appears Intact Motor  Motor Motor - Discharge Observations: midline orientation improved over admission  Mobility Transfers Stand to Sit: 5: Supervision Stand Pivot Transfers: 5: Supervision Stand Pivot Transfer Details:  Verbal cues for safe use of DME/AE Locomotion  Ambulation Ambulation/Gait Assistance: 5: Supervision Assistive device: Rolling walker Ambulation/Gait Assistance Details: Verbal cues for safe use of DME/AE Gait Gait: Yes Gait Pattern: Impaired Gait Pattern: Narrow base of support;Trunk flexed Stairs / Additional Locomotion Stairs: Yes Stairs Assistance: 5: Supervision Stair Management Technique: Two rails;Alternating pattern Number of Stairs: 5  Height of Stairs: 8  Futures trader: Both upper extremities Wheelchair Parts Management: Needs assistance Distance: 75  Trunk/Postural Assessment  Cervical Assessment Cervical Assessment: Within Functional Limits (trunk movement limited by PEG site pain) Thoracic Assessment Thoracic Assessment: Within Functional Limits Lumbar Assessment Lumbar Assessment: Within Functional Limits Postural Control Postural Control: Within Functional Limits  Balance Static Sitting Balance Static Sitting - Balance Support: No upper extremity supported;Feet unsupported  Standing dynamic- close supervision for occasional overshifts to L, with jerky, impulsive attempts to regain balance at times Extremity Assessment      RLE Assessment RLE Assessment: Within Functional Limits LLE Assessment LLE Assessment: Within Functional Limits  See FIM for current functional status  Bodi Palmeri 05/10/2012, 9:43 AM

## 2012-05-10 NOTE — Progress Notes (Addendum)
Patient ID: [name], female   DOB: 1926/09/21, 78 y.o.   MRN: [id number] Subjective/Complaints: Heartburn symptoms improved on PPI and slower bolus rate.  MBS this am Hypoglycemic episode but novalog inj were not timed with TF per RN Objective: Vital Signs: Blood pressure 115/89, pulse 90, temperature 97.5 F (36.4 C), temperature source Oral, resp. rate 17, height 5\' 3"  (1.6 m), weight 57 kg (125 lb 10.6 oz), SpO2 97.00%. No results found. Results for orders placed during the hospital encounter of 04/25/12 (from the past 72 hour(s))  GLUCOSE, CAPILLARY     Status: Abnormal   Collection Time   05/07/12 11:25 AM      Component Value Range Comment   Glucose-Capillary 162 (*) 70 - 99 (mg/dL)    Comment 1 Notify RN     GLUCOSE, CAPILLARY     Status: Abnormal   Collection Time   05/07/12  4:24 PM      Component Value Range Comment   Glucose-Capillary 165 (*) 70 - 99 (mg/dL)   GLUCOSE, CAPILLARY     Status: Abnormal   Collection Time   05/07/12  8:04 PM      Component Value Range Comment   Glucose-Capillary 234 (*) 70 - 99 (mg/dL)   GLUCOSE, CAPILLARY     Status: Abnormal   Collection Time   05/07/12 11:55 PM      Component Value Range Comment   Glucose-Capillary 132 (*) 70 - 99 (mg/dL)    Comment 1 Notify RN     GLUCOSE, CAPILLARY     Status: Normal   Collection Time   05/08/12  4:17 AM      Component Value Range Comment   Glucose-Capillary 74  70 - 99 (mg/dL)   GLUCOSE, CAPILLARY     Status: Abnormal   Collection Time   05/08/12  7:28 AM      Component Value Range Comment   Glucose-Capillary 194 (*) 70 - 99 (mg/dL)    Comment 1 Notify RN     GLUCOSE, CAPILLARY     Status: Abnormal   Collection Time   05/08/12 11:47 AM      Component Value Range Comment   Glucose-Capillary 194 (*) 70 - 99 (mg/dL)    Comment 1 Notify RN     GLUCOSE, CAPILLARY     Status: Abnormal   Collection Time   05/08/12  4:54 PM      Component Value Range Comment   Glucose-Capillary 179 (*) 70 - 99  (mg/dL)   GLUCOSE, CAPILLARY     Status: Abnormal   Collection Time   05/08/12  9:05 PM      Component Value Range Comment   Glucose-Capillary 146 (*) 70 - 99 (mg/dL)    Comment 1 Notify RN     GLUCOSE, CAPILLARY     Status: Abnormal   Collection Time   05/09/12 12:43 AM      Component Value Range Comment   Glucose-Capillary 135 (*) 70 - 99 (mg/dL)   GLUCOSE, CAPILLARY     Status: Normal   Collection Time   05/09/12  4:32 AM      Component Value Range Comment   Glucose-Capillary 74  70 - 99 (mg/dL)    Comment 1 Notify RN     GLUCOSE, CAPILLARY     Status: Abnormal   Collection Time   05/09/12  7:28 AM      Component Value Range Comment   Glucose-Capillary 178 (*) 70 - 99 (mg/dL)  Comment 1 Notify RN     GLUCOSE, CAPILLARY     Status: Abnormal   Collection Time   05/09/12 11:52 AM      Component Value Range Comment   Glucose-Capillary 212 (*) 70 - 99 (mg/dL)    Comment 1 Notify RN     URINALYSIS, ROUTINE W REFLEX MICROSCOPIC     Status: Abnormal   Collection Time   05/09/12  1:13 PM      Component Value Range Comment   Color, Urine YELLOW  YELLOW     APPearance TURBID (*) CLEAR     Specific Gravity, Urine 1.013  1.005 - 1.030     pH 7.5  5.0 - 8.0     Glucose, UA NEGATIVE  NEGATIVE (mg/dL)    Hgb urine dipstick MODERATE (*) NEGATIVE     Bilirubin Urine NEGATIVE  NEGATIVE     Ketones, ur NEGATIVE  NEGATIVE (mg/dL)    Protein, ur 30 (*) NEGATIVE (mg/dL)    Urobilinogen, UA 0.2  0.0 - 1.0 (mg/dL)    Nitrite POSITIVE (*) NEGATIVE     Leukocytes, UA LARGE (*) NEGATIVE    URINE MICROSCOPIC-ADD ON     Status: Abnormal   Collection Time   05/09/12  1:13 PM      Component Value Range Comment   Squamous Epithelial / LPF FEW (*) RARE     WBC, UA TOO NUMEROUS TO COUNT  <3 (WBC/hpf)    RBC / HPF 0-2  <3 (RBC/hpf)    Bacteria, UA MANY (*) RARE    GLUCOSE, CAPILLARY     Status: Abnormal   Collection Time   05/09/12  4:20 PM      Component Value Range Comment   Glucose-Capillary 37  (*) 70 - 99 (mg/dL)    Comment 1 Notify RN     GLUCOSE, CAPILLARY     Status: Normal   Collection Time   05/09/12  4:51 PM      Component Value Range Comment   Glucose-Capillary 96  70 - 99 (mg/dL)    Comment 1 Notify RN     GLUCOSE, CAPILLARY     Status: Abnormal   Collection Time   05/09/12  5:22 PM      Component Value Range Comment   Glucose-Capillary 170 (*) 70 - 99 (mg/dL)    Comment 1 Notify RN     GLUCOSE, CAPILLARY     Status: Abnormal   Collection Time   05/09/12  8:42 PM      Component Value Range Comment   Glucose-Capillary 162 (*) 70 - 99 (mg/dL)    Comment 1 Notify RN     GLUCOSE, CAPILLARY     Status: Abnormal   Collection Time   05/10/12 12:03 AM      Component Value Range Comment   Glucose-Capillary 135 (*) 70 - 99 (mg/dL)    Comment 1 Notify RN     GLUCOSE, CAPILLARY     Status: Normal   Collection Time   05/10/12  4:09 AM      Component Value Range Comment   Glucose-Capillary 96  70 - 99 (mg/dL)    Comment 1 Notify RN     GLUCOSE, CAPILLARY     Status: Abnormal   Collection Time   05/10/12  7:10 AM      Component Value Range Comment   Glucose-Capillary 105 (*) 70 - 99 (mg/dL)    Comment 1 Notify RN  HEENT: normal Cardio: RRR Resp: CTA B/L GI: BS positive Extremity:  No Edema Skin:   Bruise RLQ and LLQ at lovenox injection sites Neuro: Alert/Oriented, Dysarthric and Other truncal ataxia Musc/Skel:  Normal   Assessment/Plan: 1. Functional deficits secondary to Posterior circulation infarct with ataxia and severe dysphagia stable for D/C to [facility]: FIM - Bathing Bathing Steps Patient Completed: Chest;Right Arm;Left Arm;Abdomen;Front perineal area;Buttocks;Right lower leg (including foot);Left upper leg;Left lower leg (including foot);Right upper leg Bathing: 5: Supervision: Safety issues/verbal cues  FIM - Upper Body Dressing/Undressing Upper body dressing/undressing steps patient completed: Thread/unthread right sleeve of front closure  shirt/dress;Thread/unthread left sleeve of front closure shirt/dress;Pull shirt around back of front closure shirt/dress;Button/unbutton shirt Upper body dressing/undressing: 5: Supervision: Safety issues/verbal cues FIM - Lower Body Dressing/Undressing Lower body dressing/undressing steps patient completed: Thread/unthread right underwear leg;Thread/unthread left underwear leg;Pull underwear up/down;Thread/unthread right pants leg;Thread/unthread left pants leg;Pull pants up/down;Don/Doff right sock;Don/Doff left sock;Don/Doff right shoe;Don/Doff left shoe;Fasten/unfasten right shoe;Fasten/unfasten left shoe Lower body dressing/undressing: 5: Supervision: Safety issues/verbal cues  FIM - Toileting Toileting steps completed by patient: Adjust clothing prior to toileting;Adjust clothing after toileting;Performs perineal hygiene Toileting Assistive Devices: Grab bar or rail for support Toileting: 5: Supervision: Safety issues/verbal cues  FIM - Diplomatic Services operational officer Devices: Art gallery manager Transfers: 5-To toilet/BSC: Supervision (verbal cues/safety issues);5-From toilet/BSC: Supervision (verbal cues/safety issues)  FIM - Banker Devices: Therapist, occupational: 6: Sit > Supine: No assist;5: Bed > Chair or W/C: Supervision (verbal cues/safety issues);5: Chair or W/C > Bed: Supervision (verbal cues/safety issues)  FIM - Locomotion: Wheelchair Distance: 60 Locomotion: Wheelchair: 2: Travels 50 - 149 ft with supervision, cueing or coaxing FIM - Locomotion: Ambulation Locomotion: Ambulation Assistive Devices: Designer, industrial/product Ambulation/Gait Assistance: 5: Supervision Locomotion: Ambulation: 2: Travels 50 - 149 ft with supervision/safety issues  Comprehension Comprehension Mode: Auditory Comprehension: 6-Follows complex conversation/direction: With extra time/assistive device  Expression Expression Mode: Verbal Expression:  5-Expresses complex 90% of the time/cues < 10% of the time  Social Interaction Social Interaction: 6-Interacts appropriately with others with medication or extra time (anti-anxiety, antidepressant).  Problem Solving Problem Solving: 5-Solves complex 90% of the time/cues < 10% of the time  Memory Memory Assistive Devices: Memory book Memory: 5-Recognizes or recalls 90% of the time/requires cueing < 10% of the time  2. Anticoagulation/DVT prophylaxis with Pharmaceutical: Lovenox 3. Pain Management: acetominophen 4.  DM-uncontrolled  Slowly titrating lantus change to BID, uses levimir at home total long acting 48 Unit per day,dose dropped to 15Unit after hypoglycemia will increase lantus to 20 Unit bid,  Now on bolus feeds 5 x per day use novalog with bolus feeds       5.  Urinary retention + stress incont doing better with emptying, avoid urecholine due to problems handling oral secretions, add flomax.  PT to teach Kegel's.  Keflex for UTI 6.  GERD  Start PPI               LOS (Days) 15 A FACE TO FACE EVALUATION WAS PERFORMED  [name] 05/10/2012, 7:20 AM

## 2012-05-10 NOTE — Progress Notes (Signed)
Patient discharged via EMTs to [facility] at 1232. Patient alert and oriented. Report called to nurse at [facility] before discharge. Last tube feed given at 1000. No complaints of pain. Hedy Camara

## 2012-05-10 NOTE — Discharge Summary (Signed)
NAMESABAH, ZUCCO NO.:  [id number]  MEDICAL RECORD NO.:  [id number]  LOCATION:  4032                         FACILITY:  [facility]  PHYSICIAN:  Erick Colace, M.D.DATE OF BIRTH:  28-Nov-1926  DATE OF ADMISSION:  04/25/2012 DATE OF DISCHARGE:  05/10/2012                              DISCHARGE SUMMARY   ADDENDUM  The patient is scheduled for discharge to skilled nursing facility on May 10, 2012.  A modified barium swallow was completed on morning of discharge.  She had been n.p.o.  Speech therapy advised to begin a dysphagia 1 thin liquid diet.  Advised to continue all medications by tube.  Tube feeds could be held if the patient consumes greater than 50% of meals.  All issues in regard to her swallow study were discussed with the patient.  Discharge took place without any further questions.     [name], P.A.   ______________________________ Erick Colace, M.D.    DA/MEDQ  D:  05/10/2012  T:  05/10/2012  Job:  [id number]

## 2012-05-10 NOTE — Progress Notes (Signed)
Speech Language Pathology Daily Session Note & Discharge Summary  Patient Details  Name: [name] MRN: [id number] Date of Birth: 07/09/1926  Today's Date: 05/10/2012 Time: 1050-1120 Time Calculation (min): 30 min  Short Term Goals: Week 2: SLP Short Term Goal 1 (Week 2): Patient will demonstrate improved ability to consume ice chips with dereased overt s/s of aspiration to determine readiness for repeat objective swallow evaluation. SLP Short Term Goal 2 (Week 2): Patient will consume trials of puree with 5 hard effortful swallows and no overt s/s of penetration or aspiration.  SLP Short Term Goal 2 - Progress (Week 2): Progressing toward goal SLP Short Term Goal 3 (Week 2): Patient will complete oral care with modified independence. SLP Short Term Goal 3 - Progress (Week 2): Progressing toward goal  Skilled Therapeutic Interventions: Session focused on skilled dysphagia treatment and follow up after MBSS this morning; SLP facilitated session with minimal assist semantic cues faded to supervision semantic cues to utilize 2 swallows per bite/sip of magic cup (puree) and water via cup with consistent throat clears which patient reports is a strategy she uses to initiate her second swallow.  Patient exhibited a cough x1 during consumption of 2oz puree and 6 oz water due to suspected penetration of thin liquids; SLP cued for hard effortful cough to reduce/remove penetrates.  Patient also required supervision semantic cues to verbally recall sitting up for 30-45 minutes after meal and recommendation of contining to swallow oral secretions.    Recommend: Follow up at next [facility] of care.  Daily Session Precautions/Restrictions  Restrictions Weight Bearing Restrictions: No FIM:  Comprehension Comprehension Mode: Auditory Comprehension: 6-Follows complex conversation/direction: With extra time/assistive device Expression Expression Mode: Verbal Expression: 6-Expresses complex ideas: With  extra time/assistive device Social Interaction Social Interaction: 6-Interacts appropriately with others with medication or extra time (anti-anxiety, antidepressant). Problem Solving Problem Solving: 5-Solves complex 90% of the time/cues < 10% of the time Memory Memory: 5-Recognizes or recalls 90% of the time/requires cueing < 10% of the time FIM - Eating Eating Activity: 5: Supervision/cues Pain Pain Assessment Pain Assessment: No/denies pain Pain Score: 0-No pain  Therapy/Group: Individual Therapy   Speech Language Pathology Discharge Summary  Patient Details  Name: [name] MRN: [id number] Date of Birth: June 22, 1926  Today's Date: 05/10/2012  Patient has met 3 of 3 long term goals due to functional change in swallow.  Patient to discharge at overall Supervision level.  Patient's care partner requires assistance to provide the necessary dysphagia and physical assistance at discharge.  Reasons goals not met: n/a  Recommendation:  Patient will benefit from ongoing skilled SLP services in skilled nursing facility setting to continue to advance functional skills in the area of swallowing dafety with least restrictive diet and reduce care partner burden.  Equipment: none  Reasons for discharge: treatment goals met and discharge from hospital  Patient/family agrees with progress made and goals achieved: Yes  See FIM for current functional status  [name], CCC-SLP [phone]  [name] 05/10/2012, 12:25 PM

## 2012-05-11 LAB — URINE CULTURE: Culture  Setup Time: 201305161346

## 2012-05-29 ENCOUNTER — Other Ambulatory Visit: Payer: Self-pay | Admitting: Internal Medicine

## 2012-05-29 DIAGNOSIS — R633 Feeding difficulties: Secondary | ICD-10-CM

## 2012-05-30 ENCOUNTER — Ambulatory Visit (HOSPITAL_COMMUNITY): Admission: RE | Admit: 2012-05-30 | Payer: Medicare Other | Source: Ambulatory Visit

## 2012-06-14 ENCOUNTER — Ambulatory Visit (HOSPITAL_COMMUNITY)
Admission: RE | Admit: 2012-06-14 | Discharge: 2012-06-14 | Disposition: A | Discharge status: Home / Self Care | Payer: Medicare Other | Source: Ambulatory Visit | Attending: Internal Medicine | Admitting: Internal Medicine

## 2012-06-14 ENCOUNTER — Other Ambulatory Visit: Payer: Self-pay | Admitting: Internal Medicine

## 2012-06-14 DIAGNOSIS — Z431 Encounter for attention to gastrostomy: Secondary | ICD-10-CM | POA: Insufficient documentation

## 2012-06-14 DIAGNOSIS — R633 Feeding difficulties: Secondary | ICD-10-CM

## 2012-06-14 NOTE — Procedures (Signed)
Successful bedside removal of pull-through gastrostomy tube.  No immediate post procedural complications. 

## 2012-07-22 ENCOUNTER — Other Ambulatory Visit: Payer: Self-pay | Admitting: Internal Medicine

## 2012-08-09 ENCOUNTER — Encounter (HOSPITAL_COMMUNITY): Payer: Self-pay | Admitting: *Deleted

## 2012-08-09 ENCOUNTER — Emergency Department (HOSPITAL_COMMUNITY): Payer: Medicare Other

## 2012-08-09 ENCOUNTER — Inpatient Hospital Stay (HOSPITAL_COMMUNITY)
Admission: EM | Admit: 2012-08-09 | Discharge: 2012-08-13 | DRG: 193 | Disposition: A | Discharge status: Home Health | Payer: Medicare Other | Attending: Internal Medicine | Admitting: Internal Medicine

## 2012-08-09 DIAGNOSIS — N183 Chronic kidney disease, stage 3 unspecified: Secondary | ICD-10-CM | POA: Diagnosis present

## 2012-08-09 DIAGNOSIS — J9601 Acute respiratory failure with hypoxia: Secondary | ICD-10-CM | POA: Diagnosis present

## 2012-08-09 DIAGNOSIS — J189 Pneumonia, unspecified organism: Secondary | ICD-10-CM

## 2012-08-09 DIAGNOSIS — I6350 Cerebral infarction due to unspecified occlusion or stenosis of unspecified cerebral artery: Secondary | ICD-10-CM

## 2012-08-09 DIAGNOSIS — N058 Unspecified nephritic syndrome with other morphologic changes: Secondary | ICD-10-CM | POA: Diagnosis present

## 2012-08-09 DIAGNOSIS — I69992 Facial weakness following unspecified cerebrovascular disease: Secondary | ICD-10-CM

## 2012-08-09 DIAGNOSIS — Z8544 Personal history of malignant neoplasm of other female genital organs: Secondary | ICD-10-CM

## 2012-08-09 DIAGNOSIS — K5900 Constipation, unspecified: Secondary | ICD-10-CM | POA: Diagnosis present

## 2012-08-09 DIAGNOSIS — I129 Hypertensive chronic kidney disease with stage 1 through stage 4 chronic kidney disease, or unspecified chronic kidney disease: Secondary | ICD-10-CM | POA: Diagnosis present

## 2012-08-09 DIAGNOSIS — E119 Type 2 diabetes mellitus without complications: Secondary | ICD-10-CM

## 2012-08-09 DIAGNOSIS — E785 Hyperlipidemia, unspecified: Secondary | ICD-10-CM | POA: Diagnosis present

## 2012-08-09 DIAGNOSIS — D696 Thrombocytopenia, unspecified: Secondary | ICD-10-CM | POA: Diagnosis present

## 2012-08-09 DIAGNOSIS — Z66 Do not resuscitate: Secondary | ICD-10-CM | POA: Diagnosis present

## 2012-08-09 DIAGNOSIS — Z79899 Other long term (current) drug therapy: Secondary | ICD-10-CM

## 2012-08-09 DIAGNOSIS — C519 Malignant neoplasm of vulva, unspecified: Secondary | ICD-10-CM | POA: Diagnosis present

## 2012-08-09 DIAGNOSIS — E039 Hypothyroidism, unspecified: Secondary | ICD-10-CM | POA: Diagnosis present

## 2012-08-09 DIAGNOSIS — R0600 Dyspnea, unspecified: Secondary | ICD-10-CM

## 2012-08-09 DIAGNOSIS — J9600 Acute respiratory failure, unspecified whether with hypoxia or hypercapnia: Secondary | ICD-10-CM | POA: Diagnosis present

## 2012-08-09 DIAGNOSIS — I10 Essential (primary) hypertension: Secondary | ICD-10-CM

## 2012-08-09 DIAGNOSIS — D72829 Elevated white blood cell count, unspecified: Secondary | ICD-10-CM

## 2012-08-09 DIAGNOSIS — Z87891 Personal history of nicotine dependence: Secondary | ICD-10-CM

## 2012-08-09 DIAGNOSIS — D649 Anemia, unspecified: Secondary | ICD-10-CM | POA: Diagnosis present

## 2012-08-09 DIAGNOSIS — E1122 Type 2 diabetes mellitus with diabetic chronic kidney disease: Secondary | ICD-10-CM

## 2012-08-09 DIAGNOSIS — Z7901 Long term (current) use of anticoagulants: Secondary | ICD-10-CM

## 2012-08-09 DIAGNOSIS — I639 Cerebral infarction, unspecified: Secondary | ICD-10-CM | POA: Diagnosis present

## 2012-08-09 DIAGNOSIS — E669 Obesity, unspecified: Secondary | ICD-10-CM | POA: Diagnosis present

## 2012-08-09 DIAGNOSIS — Z8673 Personal history of transient ischemic attack (TIA), and cerebral infarction without residual deficits: Secondary | ICD-10-CM

## 2012-08-09 DIAGNOSIS — I4891 Unspecified atrial fibrillation: Secondary | ICD-10-CM | POA: Diagnosis present

## 2012-08-09 DIAGNOSIS — Z794 Long term (current) use of insulin: Secondary | ICD-10-CM

## 2012-08-09 DIAGNOSIS — E1129 Type 2 diabetes mellitus with other diabetic kidney complication: Secondary | ICD-10-CM | POA: Diagnosis present

## 2012-08-09 DIAGNOSIS — R0902 Hypoxemia: Secondary | ICD-10-CM | POA: Diagnosis present

## 2012-08-09 HISTORY — DX: Pneumonia, unspecified organism: J18.9

## 2012-08-09 LAB — CBC WITH DIFFERENTIAL/PLATELET
Basophils Absolute: 0 10*3/uL (ref 0.0–0.1)
Basophils Relative: 0 % (ref 0–1)
MCHC: 31.3 g/dL (ref 30.0–36.0)
Neutro Abs: 10.5 10*3/uL — ABNORMAL HIGH (ref 1.7–7.7)
Neutrophils Relative %: 88 % — ABNORMAL HIGH (ref 43–77)
RDW: 15.6 % — ABNORMAL HIGH (ref 11.5–15.5)

## 2012-08-09 LAB — BASIC METABOLIC PANEL
Chloride: 98 mEq/L (ref 96–112)
GFR calc Af Amer: 26 mL/min — ABNORMAL LOW (ref >90)
Potassium: 4.3 mEq/L (ref 3.5–5.1)
Sodium: 135 mEq/L (ref 135–145)

## 2012-08-09 LAB — PROTIME-INR
INR: 1.68 — ABNORMAL HIGH (ref 0.00–1.49)
Prothrombin Time: 20.1 seconds — ABNORMAL HIGH (ref 11.6–15.2)

## 2012-08-09 LAB — GLUCOSE, CAPILLARY: Glucose-Capillary: 258 mg/dL — ABNORMAL HIGH (ref 70–99)

## 2012-08-09 MED ORDER — ALBUTEROL (5 MG/ML) CONTINUOUS INHALATION SOLN
10.0000 mg/h | INHALATION_SOLUTION | RESPIRATORY_TRACT | Status: AC
Start: 2012-08-09 — End: 2012-08-09
  Administered 2012-08-09: 10 mg/h via RESPIRATORY_TRACT
  Filled 2012-08-09: qty 20

## 2012-08-09 MED ORDER — ALBUTEROL SULFATE (5 MG/ML) 0.5% IN NEBU
2.5000 mg | INHALATION_SOLUTION | RESPIRATORY_TRACT | Status: DC
  Administered 2012-08-10 – 2012-08-11 (×9): 2.5 mg via RESPIRATORY_TRACT
  Filled 2012-08-09 (×9): qty 0.5

## 2012-08-09 MED ORDER — DEXTROSE 5 % IV SOLN
1.0000 g | Freq: Every day | INTRAVENOUS | Status: DC
  Filled 2012-08-09: qty 10

## 2012-08-09 MED ORDER — DOCUSATE SODIUM 100 MG PO CAPS
100.0000 mg | ORAL_CAPSULE | Freq: Two times a day (BID) | ORAL | Status: DC
  Administered 2012-08-09 – 2012-08-13 (×8): 100 mg via ORAL
  Filled 2012-08-09 (×9): qty 1

## 2012-08-09 MED ORDER — TECHNETIUM TO 99M ALBUMIN AGGREGATED
3.0000 | Freq: Once | INTRAVENOUS | Status: AC | PRN
  Administered 2012-08-09: 3 via INTRAVENOUS

## 2012-08-09 MED ORDER — INSULIN DETEMIR 100 UNIT/ML ~~LOC~~ SOLN
11.0000 [IU] | Freq: Every day | SUBCUTANEOUS | Status: DC
  Administered 2012-08-09 – 2012-08-10 (×2): 11 [IU] via SUBCUTANEOUS
  Filled 2012-08-09: qty 10

## 2012-08-09 MED ORDER — INSULIN ASPART 100 UNIT/ML ~~LOC~~ SOLN
0.0000 [IU] | Freq: Three times a day (TID) | SUBCUTANEOUS | Status: DC
  Administered 2012-08-10: 1 [IU] via SUBCUTANEOUS
  Administered 2012-08-10 – 2012-08-11 (×2): 2 [IU] via SUBCUTANEOUS
  Administered 2012-08-11 – 2012-08-13 (×2): 1 [IU] via SUBCUTANEOUS

## 2012-08-09 MED ORDER — LEVOFLOXACIN 500 MG PO TABS
500.0000 mg | ORAL_TABLET | Freq: Every day | ORAL | Status: DC
  Administered 2012-08-09: 500 mg via ORAL
  Filled 2012-08-09: qty 1

## 2012-08-09 MED ORDER — INSULIN ASPART 100 UNIT/ML ~~LOC~~ SOLN
10.0000 [IU] | Freq: Three times a day (TID) | SUBCUTANEOUS | Status: DC
  Administered 2012-08-10 – 2012-08-11 (×5): 10 [IU] via SUBCUTANEOUS

## 2012-08-09 MED ORDER — WARFARIN - PHARMACIST DOSING INPATIENT: Freq: Every day | Status: DC

## 2012-08-09 MED ORDER — TAMSULOSIN HCL 0.4 MG PO CAPS
0.4000 mg | ORAL_CAPSULE | Freq: Every day | ORAL | Status: DC
  Filled 2012-08-09: qty 1

## 2012-08-09 MED ORDER — LEVOTHYROXINE SODIUM 100 MCG PO TABS
100.0000 ug | ORAL_TABLET | ORAL | Status: DC
  Administered 2012-08-10 – 2012-08-13 (×4): 100 ug via ORAL
  Filled 2012-08-09 (×5): qty 1

## 2012-08-09 MED ORDER — METOPROLOL SUCCINATE ER 25 MG PO TB24
25.0000 mg | ORAL_TABLET | Freq: Every day | ORAL | Status: DC
  Administered 2012-08-10 – 2012-08-11 (×2): 25 mg via ORAL
  Filled 2012-08-09 (×2): qty 1

## 2012-08-09 MED ORDER — ATORVASTATIN CALCIUM 20 MG PO TABS
20.0000 mg | ORAL_TABLET | Freq: Every day | ORAL | Status: DC
  Administered 2012-08-09 – 2012-08-12 (×4): 20 mg via ORAL
  Filled 2012-08-09 (×5): qty 1

## 2012-08-09 MED ORDER — AZITHROMYCIN 250 MG PO TABS: 250.0000 mg | ORAL_TABLET | Freq: Every day | ORAL | Status: DC

## 2012-08-09 MED ORDER — BIOTENE DRY MOUTH MT LIQD
15.0000 mL | Freq: Two times a day (BID) | OROMUCOSAL | Status: DC
Start: 2012-08-09 — End: 2012-08-13
  Administered 2012-08-09 – 2012-08-12 (×7): 15 mL via OROMUCOSAL

## 2012-08-09 MED ORDER — IPRATROPIUM BROMIDE 0.02 % IN SOLN
0.5000 mg | RESPIRATORY_TRACT | Status: DC
  Administered 2012-08-10 – 2012-08-11 (×9): 0.5 mg via RESPIRATORY_TRACT
  Filled 2012-08-09 (×9): qty 2.5

## 2012-08-09 MED ORDER — DILTIAZEM HCL ER COATED BEADS 360 MG PO CP24
360.0000 mg | ORAL_CAPSULE | Freq: Every day | ORAL | Status: DC
  Administered 2012-08-10: 360 mg via ORAL
  Filled 2012-08-09 (×2): qty 1

## 2012-08-09 MED ORDER — WARFARIN SODIUM 5 MG PO TABS
5.0000 mg | ORAL_TABLET | Freq: Once | ORAL | Status: AC
  Administered 2012-08-09: 5 mg via ORAL
  Filled 2012-08-09: qty 1

## 2012-08-09 MED ORDER — INSULIN ASPART 100 UNIT/ML ~~LOC~~ SOLN
0.0000 [IU] | Freq: Every day | SUBCUTANEOUS | Status: DC
  Administered 2012-08-09: 3 [IU] via SUBCUTANEOUS

## 2012-08-09 MED ORDER — AZITHROMYCIN 500 MG PO TABS
500.0000 mg | ORAL_TABLET | Freq: Every day | ORAL | Status: DC
  Filled 2012-08-09: qty 1

## 2012-08-09 MED ORDER — INSULIN DETEMIR 100 UNIT/ML ~~LOC~~ SOLN
30.0000 [IU] | Freq: Every day | SUBCUTANEOUS | Status: DC
  Administered 2012-08-10 – 2012-08-11 (×2): 30 [IU] via SUBCUTANEOUS

## 2012-08-09 NOTE — ED Provider Notes (Signed)
Patient still feeling short of breath without improvement. She reports feeling bad and her husband is concerned about her. Patient is unable to ambulate without oxygen desaturation into the low 80's%. She is stable while laying in bed.  Filed Vitals:   08/09/12 1615  BP: 106/71  Pulse:   Temp:   Resp: 15   She appears fatigued. Breathing appears to require increased effort, however, she is not distressed. Her lungs have occasional rales and wheezing throughout. Heart has regular rate and rhythm. No edema of lower extremities noted.   Patient will be admitted to hospitalist service with Community Acquired Pneumonia. She will be on Levaquin 500mg  daily. She is also taking Coumadin and her INR should be closely monitored. Plan discussed with Dr. Effie Shy and patient who are both agreeable.   Emilia Beck, PA-C 08/09/12 1739

## 2012-08-09 NOTE — ED Provider Notes (Signed)
Assumed pt care in CDU.  Pt presents with SOB.  CXR shows evidence of pna. VQ scan shows no evidence of PE.  Will treat CAP with Levaquin 500mg  PO qday x 7 days.  Pt will need to f/u with PCP next week for renal function recheck while on abx.    Plan to d/c pt once she's able to maintain good oxygenation with ambulation without supplemental O2.    Fayrene Helper, PA-C 08/09/12 1501

## 2012-08-09 NOTE — ED Notes (Signed)
Pt released from [facility] after stoke on June 10th.  Pt reports SOB for 5 weeks.  Saw PCP yesterday and had chest xray which she was told was normal.  Pt not on O2 at home.  Pt on 2l  now and o2 at 95%.  Pt alert oriented X4.  Pt breathing is labored and shallow.  Rn notes diminished lung sounds in bases bilaterally.

## 2012-08-09 NOTE — ED Notes (Addendum)
Pt to ED for 5 week hx of sob. Pt speaking in short choppy sentences in triage. At rest pulse ox 90-93% on room. Denies any chest pain. Pt reports nurse came to house this am and instructed pt to come to ED. Pt had chest xray, ecg, and blood work done at doctors office yesterday she states but she was not given any new prescriptions or instructions. Pt unsure if she has hx of chf or copd. Does not wear home O2.

## 2012-08-09 NOTE — ED Notes (Signed)
Family at bedside. 

## 2012-08-09 NOTE — ED Notes (Signed)
resp therapy aware of pt continuous neb

## 2012-08-09 NOTE — ED Notes (Addendum)
Faith from [facility] called to inform that pt is from Dr. Anne Fu, had chest xray, ekg and labs yesterday.BNP 420, Hgb 11, BUN 33, creatnine 1.9, pulse ox 82-87% on room air and blood sugar drop to 60  Yesterday.

## 2012-08-09 NOTE — ED Notes (Signed)
Patient has arrived in cdu. She is fully dressed and has no iv access. Nuclear medicine is calling to get pt. Will get iv started and send pt to xray for chest and plan is for nuclear medicine to take her from there,

## 2012-08-09 NOTE — ED Provider Notes (Signed)
History     CSN: [id number]  Arrival date & time 08/09/12  1038   First MD Initiated Contact with Patient 08/09/12 1148      Chief Complaint  Patient presents with  . Shortness of Breath    (Consider location/radiation/quality/duration/timing/severity/associated sxs/prior treatment) HPI Comments: [name] 78 y.o. female   The chief complaint is: Patient presents with:   Shortness of Breath   The patient has medical history significant for:   Past Medical History:   Diabetes mellitus                                            Hypertension                                                 Hyperlipidemia                                               Obesity                                                      Hypothyroidism                                               Vulvar cancer, carcinoma                        11/03/2011    Stroke                                          05/2000        Comment:right brain CVA pre H&P 2001   Atrial fibrillation                                         Patient presents today after seeing her cardiologist Dr. Donato Schultz yesterday. At that appointment a CXR revealed that she may have COPD her 02 sats per conversation with nurse was 91%. Today home health reported her saturations to be 90-93% on room air. History from patient difficult to obtain as she becomes SOB during conversation. Per conversation with nurse at [facility] the patient was instructed to come to the ER to rule out PE. Denies fever or chills. Denies wheezing or CP. Denies NVD or abdominal pain.      The history is provided by the patient and the spouse.    Past Medical History  Diagnosis Date  . Diabetes mellitus   . Hypertension   . Hyperlipidemia   . Obesity   . Hypothyroidism   . Vulvar cancer, carcinoma 11/03/2011  . Stroke  05/2000    right brain CVA pre H&P 2001  . Atrial fibrillation     Past Surgical History  Procedure Date  . Vulvar lesion  removal 2012  . Breast surgery     Lumpectomy  . Dilation and curettage of uterus   . Cystourethroplasty / ureteroneocystostomy   . Radical vulvectomy   . Lymphadenectomy 2007    R inguinal femoral  . Radical vulvectomy 02/27/12  . Vulvar lesion removal 02/27/2012    Procedure: VULVAR LESION;  Surgeon: Jeannette Corpus, MD;  Location: [facility];  Service: Gynecology;  Laterality: N/A;    Family History  Problem Relation Age of Onset  . Diabetes type II      History  Substance Use Topics  . Smoking status: Former Smoker -- 0.2 packs/day for 18 years    Types: Cigarettes    Quit date: 11/03/1967  . Smokeless tobacco: Not on file  . Alcohol Use: No    OB History    Grav Para Term Preterm Abortions TAB SAB Ect Mult Living                  Review of Systems  Constitutional: Negative for fever and chills.  Respiratory: Positive for shortness of breath. Negative for cough and wheezing.   Cardiovascular: Negative for chest pain, palpitations and leg swelling.  Gastrointestinal: Negative for nausea, vomiting, abdominal pain and diarrhea.  All other systems reviewed and are negative.    Allergies  Review of patient's allergies indicates no known allergies.  Home Medications   Current Outpatient Rx  Name Route Sig Dispense Refill  . ATORVASTATIN CALCIUM 20 MG PO TABS Oral Take 20 mg by mouth at bedtime.     [name] DILTIAZEM HCL ER COATED BEADS 360 MG PO CP24 Oral Take 360 mg by mouth daily.    . ERGOCALCIFEROL 50000 UNITS PO CAPS Oral Take 50,000 Units by mouth once a week. Monday    . INSULIN ASPART 100 UNIT/ML [place] SOLN Subcutaneous Inject 16-26 Units into the skin 3 (three) times daily before meals. 16 units in the am, 16 at lunch, and 26 at supper    . INSULIN DETEMIR 100 UNIT/ML [place] SOLN Subcutaneous Inject 16-44 Units into the skin 2 (two) times daily. 44 units at breakfast, 16 in the evening    . LEVOTHYROXINE SODIUM 100 MCG PO TABS Oral Take 100 mcg by mouth every morning.      [name] METOPROLOL SUCCINATE ER 25 MG PO TB24 Oral Take 25 mg by mouth daily.    [name] TAMSULOSIN HCL 0.4 MG PO CAPS Oral Take 0.4 mg by mouth daily after supper.    . WARFARIN SODIUM 5 MG PO TABS Oral Take 5 mg by mouth every evening. Take 1 tablet daily expect take 0.5 tablet on Friday      BP 115/63  Pulse 72  Temp 97.6 F (36.4 C) (Oral)  Resp 22  SpO2 96%  Physical Exam  Nursing note and vitals reviewed. Constitutional: She appears well-developed and well-nourished. She appears distressed.  HENT:  Head: Normocephalic and atraumatic.  Mouth/Throat: Oropharynx is clear and moist.  Eyes: Conjunctivae and EOM are normal. No scleral icterus.  Neck: Normal range of motion.  Cardiovascular: Normal rate, regular rhythm and normal heart sounds.   Pulmonary/Chest: Effort normal and breath sounds normal.       No adventitious sounds, but patient is not moving air well.  Abdominal: Soft. Bowel sounds are normal. There is no tenderness.  Skin:  Skin is warm and dry.    ED Course  Procedures (including critical care time)  Labs Reviewed - No data to display No results found.  Date: 08/09/2012  Rate: 93  Rhythm: atrial fibrillation   QRS Axis: normal  Intervals: normal  ST/T Wave abnormalities: normal  Conduction Disutrbances:none  Narrative Interpretation: No stemi  Old EKG Reviewed: unchanged    1. Dyspnea       MDM  Patient presented after her cardiologist and home health nurse became concerned about her 02 sats (90-93%). Patient 02 sats 95% on 2L 02 in ED. Per nurse labs drawn yesterday significant for a mildly elevated BNP of 420, elevated creatinine 1.9 and BUN 33. CXR yesterday suspicious for possible COPD. EKG: remarkable for A-fib unchanged. Patient completed VQ which was negative. Labs and CXR results requested from [facility]. Will be transferred to CDU.        Pixie Casino, PA-C 08/09/12 1316

## 2012-08-09 NOTE — Progress Notes (Addendum)
ANTICOAGULATION CONSULT NOTE - Initial Consult  Pharmacy Consult for coumadin Indication: hx  stroke  No Known Allergies  Patient Measurements:     Vital Signs: Temp: 98 F (36.7 C) (08/16 1320) Temp src: Oral (08/16 1320) BP: 110/58 mmHg (08/16 1800) Pulse Rate: 74  (08/16 1800)  Labs:  Basename 08/09/12 2049 08/09/12 2021  HGB 10.0* --  HCT 32.0* --  PLT 137* --  APTT -- --  LABPROT -- 20.1*  INR -- 1.68*  HEPARINUNFRC -- --  CREATININE 1.96* --  CKTOTAL -- --  CKMB -- --  TROPONINI -- --    The CrCl is unknown because both a height and weight (above a minimum accepted value) are required for this calculation.   Medical History: Past Medical History  Diagnosis Date  . Diabetes mellitus   . Hypertension   . Hyperlipidemia   . Obesity   . Hypothyroidism   . Vulvar cancer, carcinoma 11/03/2011  . Stroke 05/2000    right brain CVA pre H&P 2001  . Atrial fibrillation     Medications:  See med rec   Assessment: Patient is an 78 y.o F on Coumadin PTA for hx CVA. She c/o SOB and was sent here by her cardiologist for r/o PE. V/Q scan with "very low probability" for PE. Per patient, home Coumadin is 5mg  daily except 2.5mg  on Fridays and last dose taken was last night.  INR is subtherapeutic today at 1.68.  Chest x-ray with suspicion for PNA with Levaquin 500mg  PO x1 given in the ED today.  Rocephin and Azithromycin are ordered to be started once patient is admitted to the floor.  Goal of Therapy:  INR 2-3  Plan:  1) coumadin 5mg  PO x1 today 2) f/u with AM INR and adjust dose as needed 3) No renal adjustment is need for Rocephin and Azithromycin.  Will sign off for abx.  Harlon Kutner P 08/09/2012,9:31 PM

## 2012-08-09 NOTE — H&P (Signed)
[facility] History and Physical  ALAYIAH FONTES YNW:[id number] DOB: 07-05-1926 DOA: 08/09/2012  Referring physician: Dr. Effie Shy PCP: [name], MD   Chief Complaint: Difficulty breathin  HPI:  The patient is an 78 year old female with history of vulvar cancer, diabetes, HTN, HL, hypothyroidism, recurrent CVA now with left facial droop and mild balance problems who presents with a two month history of shortness of breath.  The patient states that over the last two months, she has had slowly progressive shortness of breath with increased wheezing, particularly in the mornings.  She has also had a cough productive of clear phlegm.  She denies fevers, chills, night sweats.  She has been somewhat fatigued.  Unsure if lying flat worsens her dyspnea, and denies LEE.  She previously was able to ambulate around her house without difficulty, but currently is Gissella Niblack of breath resting in bed.  She has had some mild sinus congestion with clear rhinorrhea during this time without eye itching.  Incidentally, patient had episode of hypoglycemia to 61 last night.    In ER:  Patient received CXR consistent with perihilar infiltrate and V/Q which was negative for PE.  She was hypoxic to the mid-80s with tachypnea requiring oxygen. Received nebulizer treatment and levofloxacin in ER.    Review of Systems:  Patient denies significant weight loss, chest pain, abdominal pain, nausea, vomiting, diarrhea, blood in stools, urinary symptoms.  She has been mildly constipated and started taking a stool softener.  Endorses left facial weakness, mild balance problems, but denies dysphagia.  Endorses easy bruising.  Denies lymphadenopathy.  Past Medical History  Diagnosis Date  . Diabetes mellitus   . Hypertension   . Hyperlipidemia   . Obesity   . Hypothyroidism   . Vulvar cancer, carcinoma 11/03/2011  . Stroke 05/2000    right brain CVA pre H&P 2001  . Atrial fibrillation    Past Surgical History  Procedure  Date  . Vulvar lesion removal 2012  . Breast surgery     Lumpectomy  . Dilation and curettage of uterus   . Cystourethroplasty / ureteroneocystostomy   . Radical vulvectomy   . Lymphadenectomy 2007    R inguinal femoral  . Radical vulvectomy 02/27/12  . Vulvar lesion removal 02/27/2012    Procedure: VULVAR LESION;  Surgeon: Jeannette Corpus, MD;  Location: [facility];  Service: Gynecology;  Laterality: N/A;   Social History:  reports that she quit smoking about 44 years ago. Her smoking use included Cigarettes. She has a 4.5 pack-year smoking history. She does not have any smokeless tobacco history on file. She reports that she does not drink alcohol or use illicit drugs. Lives at home with husband and is able to complete most ADLs without assistance.  Ambulates with cane or walker.    No Known Allergies  Family History  Problem Relation Age of Onset  . Diabetes type II    . Coronary artery disease Mother   . Lung cancer Brother   . Diabetes type II Brother     Prior to Admission medications   Medication Sig Start Date End Date Taking? Authorizing Provider  atorvastatin (LIPITOR) 20 MG tablet Take 20 mg by mouth at bedtime.    Yes Historical Provider, MD  diltiazem (CARDIZEM CD) 360 MG 24 hr capsule Take 360 mg by mouth daily.   Yes Historical Provider, MD  ergocalciferol (VITAMIN D2) 50000 UNITS capsule Take 50,000 Units by mouth once a week. Monday   Yes Historical Provider, MD  insulin  aspart (NOVOLOG) 100 UNIT/ML injection Inject 16-26 Units into the skin 3 (three) times daily before meals. 16 units in the am, 16 at lunch, and 26 at supper   Yes Historical Provider, MD  insulin detemir (LEVEMIR) 100 UNIT/ML injection Inject 16-44 Units into the skin 2 (two) times daily. 44 units at breakfast, 16 in the evening   Yes Historical Provider, MD  levothyroxine (SYNTHROID, LEVOTHROID) 100 MCG tablet Take 100 mcg by mouth every morning.    Yes Historical Provider, MD  metoprolol  succinate (TOPROL-XL) 25 MG 24 hr tablet Take 25 mg by mouth daily.   Yes Historical Provider, MD  Tamsulosin HCl (FLOMAX) 0.4 MG CAPS Take 0.4 mg by mouth daily after supper.   Yes Historical Provider, MD  warfarin (COUMADIN) 5 MG tablet Take 5 mg by mouth every evening. Take 1 tablet daily expect take 0.5 tablet on Friday   Yes Historical Provider, MD   Physical Exam: Filed Vitals:   08/09/12 1630 08/09/12 1700 08/09/12 1739 08/09/12 1800  BP:  120/71  110/58  Pulse:  56  74  Temp:      TempSrc:      Resp:  23  17  SpO2: 100% 96% 100% 100%     General:  Caucasian female, mild tachypnea.  Able to speak in full sentences, but then must rest or breath faster in between.  Eyes: PERRL (cataract-removal with new lenses), anicteric, noninjected.  Left eyelid does not close as well  ENT:  Nares with clear rhinorrhea.  Edentulous with no exudate or erythema  Neck: supple, no JVP   Cardiovascular: RRR, 1/6 murmur left sternal border.    Respiratory: Tachypnea without accessory muscle use.  I:E 1:4 with scattered wheeze and course breath sounds posteriorly  Abdomen: NABS, soft, nondistended, nontender, no organomegaly  Skin:  No rash  Musculoskeletal: Normal tone and bulk  Psychiatric: A&Ox4  Neurologic: Left facial droop with mouth and eye.  5/5 extremity strength and 2+ bilateral reflexes.  Sensation intact to light touch.    Labs on Admission:  Basic Metabolic Panel: No results found for this basename: NA:5,K:5,CL:5,CO2:5,GLUCOSE:5,BUN:5,CREATININE:5,CALCIUM:5,MG:5,PHOS:5 in the last 168 hours Liver Function Tests: No results found for this basename: AST:5,ALT:5,ALKPHOS:5,BILITOT:5,PROT:5,ALBUMIN:5 in the last 168 hours No results found for this basename: LIPASE:5,AMYLASE:5 in the last 168 hours No results found for this basename: AMMONIA:5 in the last 168 hours CBC: No results found for this basename: WBC:5,NEUTROABS:5,HGB:5,HCT:5,MCV:5,PLT:5 in the last 168 hours Cardiac  Enzymes: No results found for this basename: CKTOTAL:5,CKMB:5,CKMBINDEX:5,TROPONINI:5 in the last 168 hours  BNP (last 3 results)  Basename 09/20/11 1342  PROBNP 2866.0*   CBG:  Lab 08/09/12 1500  GLUCAP 130*    Radiological Exams on Admission: Dg Chest 2 View  08/09/2012  *RADIOLOGY REPORT*  Clinical Data: Shortness of breath.  CHEST - 2 VIEW  Comparison: 04/23/2012  Findings: Lungs are hypoinflated demonstrate a patchy air space process of the central right lung and perihilar region.  There is no effusion.  The cardiomediastinal silhouette and remainder of the exam is unchanged.  IMPRESSION: Patchy air space process of the right perihilar region and central right lung suggesting infection.  Original Report Authenticated By: Elba Barman, M.D.   Nm Pulmonary Perf And Vent  08/09/2012  *RADIOLOGY REPORT*  Clinical Data:  Johnathan Heskett of breath  NUCLEAR MEDICINE VENTILATION - PERFUSION LUNG SCAN  Technique:    Perfusion images were obtained in multiple projections after intravenous injection of Tc-33m MAA.  Radiopharmaceuticals: 3.0 mCi Tc-65m MAA.  Comparison: Chest radiograph 08/06 1013  Findings:  Perfusion: No wedge shaped peripheral perfusion defects to suggest acute pulmonary embolism.  There is small defect at the lung base with on the right which may relate to obstructive disease or air space disease seen on comparison radiograph.   IMPRESSION:  Very low probability for acute pulmonary embolism.  Chest radiograph suggests pneumonia.  Original Report Authenticated By: Genevive Bi, M.D.    EKG: Independently reviewed. Atrial fibrillation, rate in 80s, diminished amplitude.  Normal axis.  No acute ischemia  Assessment/Plan Principal Problem:  *Community acquired pneumonia Active Problems:  Atrial fibrillation with RVR  Diabetes mellitus  Hypothyroidism  CVA (cerebral infarction)  Hypoxia  Shortness of breath:  DDx includes PNA, perihilar mass, COPD exacerbation, IPF.  Most  likely patient has atypical pneumonia, however, given chronicity, am concerned for possible perihilar mass.  If patient not improving with antibiotics, will consider CT chest.  COPD exacerbation less likely given patient has only a minor tobacco history, also infiltrate on CXR.   -  Blood Cx -  Sputum gram stain -  Urine pneumococcal and legionella Ags -  Ceftriaxone and azithromycin (to avoid interaction with warfarin) -  Consider CT chest without contrast in AM to evaluate for mediastinal mass -  Duonebs q6h Atrial fibrillation, CHADS2 of 5 with recent CVA.  Rate controlled -  Continue metoprolol and warfarin to keep INR 2-3 Diabetes mellitus with recent hypoglycemia -  Continue home insulin at 2/3 dose  Hypothyroidism, asymptomatic -  Continue synthroid CVAs with residual deficits, stable -   Continue warfarin and statin Constipation -  Stool softener  Vulvar cancer and other medical issues currently stable.  DIET:  Diabetic ACCESS:  PIV PROPH:  Therapeutic warfarin   Code Status: DNR/DNI Family Communication: Patient alone in room, but husband Perlie Gold is contact person.  Home:  [phone], Work:  [phone] Disposition Plan: Pending patient able to complete ADLs without respiratory distress, resolving hypoxia, tolerating food  Time spent: 17  Ryleah Miramontes [facility] Pager [phone]  If 7PM-7AM, please contact night-coverage [email or web address] Password [email or web address] 08/09/2012, 8:21 PM

## 2012-08-09 NOTE — ED Provider Notes (Signed)
[name] is a 78 y.o. female  who is here for evaluation of shortness of breath. She was seen yesterday by her cardiologist, who to labs, and chest x-ray. These are reportedly unrevealing. At the recommendation of her cardiologist, she was sent here to be evaluated for a PE. Apparently, at home. Today, her oxygen saturations were less than 90%. In emergency department she is satting 97% on 2 L per nasal cannula.   Patient appears clinically dehydrated. She talks in a breathless manner, but is able to speak in full sentences. She is cooperative, and follows directions.  Patient to be evaluated for PE with VQ scan.  17:20- patient was ambulated on room air, and desaturated to the low 80s. She has been treated with oral Levaquin for pneumonia ( patchy right perihilar ). We will admit her to her PCP for further treatment.      Flint Melter, MD 08/09/12 1723

## 2012-08-10 ENCOUNTER — Inpatient Hospital Stay (HOSPITAL_COMMUNITY): Payer: Medicare Other

## 2012-08-10 DIAGNOSIS — J189 Pneumonia, unspecified organism: Principal | ICD-10-CM

## 2012-08-10 DIAGNOSIS — J9600 Acute respiratory failure, unspecified whether with hypoxia or hypercapnia: Secondary | ICD-10-CM

## 2012-08-10 DIAGNOSIS — E1129 Type 2 diabetes mellitus with other diabetic kidney complication: Secondary | ICD-10-CM

## 2012-08-10 DIAGNOSIS — D696 Thrombocytopenia, unspecified: Secondary | ICD-10-CM | POA: Diagnosis present

## 2012-08-10 DIAGNOSIS — D649 Anemia, unspecified: Secondary | ICD-10-CM

## 2012-08-10 DIAGNOSIS — E1165 Type 2 diabetes mellitus with hyperglycemia: Secondary | ICD-10-CM

## 2012-08-10 DIAGNOSIS — I4891 Unspecified atrial fibrillation: Secondary | ICD-10-CM

## 2012-08-10 HISTORY — DX: Type 2 diabetes mellitus with other diabetic kidney complication: E11.29

## 2012-08-10 HISTORY — DX: Type 2 diabetes mellitus with other diabetic kidney complication: E11.65

## 2012-08-10 LAB — CBC
MCV: 95 fL (ref 78.0–100.0)
Platelets: 126 10*3/uL — ABNORMAL LOW (ref 150–400)
RBC: 3.22 MIL/uL — ABNORMAL LOW (ref 3.87–5.11)
WBC: 8 10*3/uL (ref 4.0–10.5)

## 2012-08-10 LAB — HIV ANTIBODY (ROUTINE TESTING W REFLEX): HIV: NONREACTIVE

## 2012-08-10 LAB — BASIC METABOLIC PANEL
CO2: 24 mEq/L (ref 19–32)
Calcium: 9.2 mg/dL (ref 8.4–10.5)
Chloride: 102 mEq/L (ref 96–112)
Sodium: 138 mEq/L (ref 135–145)

## 2012-08-10 LAB — GLUCOSE, CAPILLARY
Glucose-Capillary: 180 mg/dL — ABNORMAL HIGH (ref 70–99)
Glucose-Capillary: 112 mg/dL — ABNORMAL HIGH (ref 70–99)

## 2012-08-10 LAB — PROTIME-INR: INR: 1.81 — ABNORMAL HIGH (ref 0.00–1.49)

## 2012-08-10 LAB — PRO B NATRIURETIC PEPTIDE: Pro B Natriuretic peptide (BNP): 2235 pg/mL — ABNORMAL HIGH (ref 0–450)

## 2012-08-10 LAB — HEMOGLOBIN A1C: Hgb A1c MFr Bld: 6.7 % — ABNORMAL HIGH (ref <5.7)

## 2012-08-10 MED ORDER — FUROSEMIDE 10 MG/ML IJ SOLN
40.0000 mg | Freq: Once | INTRAMUSCULAR | Status: AC
  Administered 2012-08-10: 40 mg via INTRAVENOUS
  Filled 2012-08-10: qty 4

## 2012-08-10 MED ORDER — LEVOFLOXACIN IN D5W 500 MG/100ML IV SOLN
500.0000 mg | INTRAVENOUS | Status: DC
Start: 2012-08-10 — End: 2012-08-12
  Administered 2012-08-10 – 2012-08-11 (×2): 500 mg via INTRAVENOUS
  Filled 2012-08-10 (×3): qty 100

## 2012-08-10 MED ORDER — METHOCARBAMOL 500 MG PO TABS
500.0000 mg | ORAL_TABLET | Freq: Four times a day (QID) | ORAL | Status: DC | PRN
  Administered 2012-08-10 – 2012-08-13 (×5): 500 mg via ORAL
  Filled 2012-08-10 (×5): qty 1

## 2012-08-10 MED ORDER — WARFARIN SODIUM 5 MG PO TABS
5.0000 mg | ORAL_TABLET | Freq: Once | ORAL | Status: AC
  Administered 2012-08-10: 5 mg via ORAL
  Filled 2012-08-10: qty 1

## 2012-08-10 NOTE — Progress Notes (Signed)
ANTICOAGULATION CONSULT NOTE - Follow Up Consult  Pharmacy Consult for warfarin Indication: hx of  stroke  No Known Allergies  Patient Measurements: Height: 5\' 3"  (160 cm) Weight: 123 lb 10.9 oz (56.1 kg) IBW/kg (Calculated) : 52.4    Vital Signs: Temp: 98.2 F (36.8 C) (08/17 0520) BP: 125/75 mmHg (08/17 0520) Pulse Rate: 105  (08/17 0520)  Labs:  Basename 08/10/12 0556 08/09/12 2049 08/09/12 2021  HGB 9.7* 10.0* --  HCT 30.6* 32.0* --  PLT 126* 137* --  APTT -- -- --  LABPROT 21.3* -- 20.1*  INR 1.81* -- 1.68*  HEPARINUNFRC -- -- --  CREATININE 1.79* 1.96* --  CKTOTAL -- -- --  CKMB -- -- --  TROPONINI -- -- --    Estimated Creatinine Clearance: 19.7 ml/min (by C-G formula based on Cr of 1.79).   Medications:  Prescriptions prior to admission  Medication Sig Dispense Refill  . atorvastatin (LIPITOR) 20 MG tablet Take 20 mg by mouth at bedtime.       [name] diltiazem (CARDIZEM CD) 360 MG 24 hr capsule Take 360 mg by mouth daily.      . ergocalciferol (VITAMIN D2) 50000 UNITS capsule Take 50,000 Units by mouth once a week. Monday      . insulin aspart (NOVOLOG) 100 UNIT/ML injection Inject 16-26 Units into the skin 3 (three) times daily before meals. 16 units in the am, 16 at lunch, and 26 at supper      . insulin detemir (LEVEMIR) 100 UNIT/ML injection Inject 16-44 Units into the skin 2 (two) times daily. 44 units at breakfast, 16 in the evening      . levothyroxine (SYNTHROID, LEVOTHROID) 100 MCG tablet Take 100 mcg by mouth every morning.       . metoprolol succinate (TOPROL-XL) 25 MG 24 hr tablet Take 25 mg by mouth daily.      . Tamsulosin HCl (FLOMAX) 0.4 MG CAPS Take 0.4 mg by mouth daily after supper.      . warfarin (COUMADIN) 5 MG tablet Take 2.5-5 mg by mouth daily. Takes 5mg  daily except 2.5mg  on Fridays        Assessment: Patient is an 78 y.o F on Coumadin PTA for hx CVA. She c/o SOB and was sent here by her cardiologist for r/o PE. V/Q scan with "very low  probability" for PE, but CXR suspicious for PNA. Per patient, home Coumadin is 5mg  daily except 2.5mg  on Fridays. INR yesterday was 1.68 and is 1.81 today.    Goal of Therapy:  INR 2-3 Monitor platelets by anticoagulation protocol: Yes   Plan:  1) coumadin 5mg  PO x1 today  2) f/u INR and adjust dose as needed    Vania Rea. Darin Engels.D. Clinical Pharmacist Pager [phone] Phone [phone] 08/10/2012 8:21 AM

## 2012-08-10 NOTE — Progress Notes (Signed)
[facility] PROGRESS NOTE  [name] XBJ:[id number] DOB: [birth year] DOA: 08/09/2012 PCP: [name], MD  Assessment/Plan: Principal Problem:  *Acute respiratory failure with hypoxia Active Problems:  Vulvar cancer, carcinoma  Atrial fibrillation with RVR  Diabetes mellitus  Hypertension  Hyperlipidemia  Obesity (BMI 30-39.9)  Hypothyroidism  CVA (cerebral infarction)  Community acquired pneumonia  DM type 2 causing CKD stage 3  1. Right lower lobe pneumonia-location raises suspicion for aspiration. After reviewing CT scan of the chest there are clear changes of chronic bronchitis. We'll treat with IV Levaquin and follow closely 2. Hypoxic respiratory failure-I am unsure if pneumonia is the only etiology. BNP is elevated. VQ scan is very low prob. We'll continue supplemental oxygen and add IV diuretic 3. Diabetes mellitus - according to patient she had recent hypoglycemia. She seems to be doing well on the current dose of Levemir and NovoLog 4. Recent surgery for vulvar cancer - patient reports that the healing has been completed and that she does not have any further voiding problems 5. Chronic kidney disease stage III suspect due to diabetic nephropathy. Last normal creatinine the computer is from 2009 6. Recent stroke - she has recuperated quite well. Continue Coumadin for secondary prophylaxis 7. Hypothyroidism-continue home dose Synthroid 8. Anemia and thrombocytopenia - no clear etiology. Will check a vitamin B 12 level 9. Chronic atrial fibrillation-rate controlled on beta blocker and calcium channel blocker. Patient is on chronic Coumadin  Code Status: Full Family Communication: Husband Disposition Plan: Home   Brief narrative: The patient is an 78 year old female with history of vulvar cancer, diabetes, HTN, HL, hypothyroidism, recurrent CVA now with left facial droop and mild balance problems who presents with a two month history of shortness of breath.  The  patient states that over the last two months, she has had slowly progressive shortness of breath with increased wheezing, particularly in the mornings. She has also had a cough productive of clear phlegm. She denies fevers, chills, night sweats. She has been somewhat fatigued. Unsure if lying flat worsens her dyspnea, and denies LEE. She previously was able to ambulate around her house without difficulty, but currently is short of breath resting in bed. She has had some mild sinus congestion with clear rhinorrhea during this time without eye itching.  Incidentally, patient had episode of hypoglycemia to 61 last night.  In ER: Patient received CXR consistent with perihilar infiltrate and V/Q which was negative for PE. She was hypoxic to the mid-80s with tachypnea requiring oxygen. Received nebulizer treatment and levofloxacin in ER.    Consultants:  None  Procedures:  VQ scan  CT chest without contrast    Antibiotics:  Levaquin August 17  HPI/Subjective: She says she feels better  Objective: Filed Vitals:   08/10/12 1200 08/10/12 1417 08/10/12 1618 08/10/12 1620  BP:  100/58    Pulse:  106 101   Temp:  98 F (36.7 C)    TempSrc:  Oral    Resp:  20    Height:      Weight:      SpO2: 97% 97% 87% 97%    Intake/Output Summary (Last 24 hours) at 08/10/12 1645 Last data filed at 08/10/12 1247  Gross per 24 hour  Intake    230 ml  Output    900 ml  Net   -670 ml   Filed Weights   08/09/12 2227  Weight: 56.1 kg (123 lb 10.9 oz)    Exam:   General:  Alert and  oriented x3  Cardiovascular: Irregularly irregular  Respiratory: Fine crackles right base-unable to speak in full sentences  Abdomen: Soft, distended, nontender  Data Reviewed: Basic Metabolic Panel:  Lab 08/10/12 4540 08/09/12 2049  NA 138 135  K 4.0 4.3  CL 102 98  CO2 24 23  GLUCOSE 132* 332*  BUN 41* 43*  CREATININE 1.79* 1.96*  CALCIUM 9.2 8.8  MG -- --  PHOS -- --   Liver Function  Tests: No results found for this basename: AST:5,ALT:5,ALKPHOS:5,BILITOT:5,PROT:5,ALBUMIN:5 in the last 168 hours No results found for this basename: LIPASE:5,AMYLASE:5 in the last 168 hours No results found for this basename: AMMONIA:5 in the last 168 hours CBC:  Lab 08/10/12 0556 08/09/12 2049  WBC 8.0 12.0*  NEUTROABS -- 10.5*  HGB 9.7* 10.0*  HCT 30.6* 32.0*  MCV 95.0 95.2  PLT 126* 137*   Cardiac Enzymes: No results found for this basename: CKTOTAL:5,CKMB:5,CKMBINDEX:5,TROPONINI:5 in the last 168 hours BNP (last 3 results)  Basename 08/10/12 0824 09/20/11 1342  PROBNP 2235.0* 2866.0*   CBG:  Lab 08/10/12 1142 08/10/12 0743 08/09/12 2244 08/09/12 1500  GLUCAP 180* 132* 258* 130*    No results found for this or any previous visit (from the past 240 hour(s)).   Studies: Dg Chest 2 View  08/09/2012  *RADIOLOGY REPORT*  Clinical Data: Shortness of breath.  CHEST - 2 VIEW  Comparison: 04/23/2012  Findings: Lungs are hypoinflated demonstrate a patchy air space process of the central right lung and perihilar region.  There is no effusion.  The cardiomediastinal silhouette and remainder of the exam is unchanged.  IMPRESSION: Patchy air space process of the right perihilar region and central right lung suggesting infection.  Original Report Authenticated By: Elba Barman, M.D.   Ct Chest Wo Contrast  08/10/2012  *RADIOLOGY REPORT*  Clinical Data: Progressive shortness of breath, wheezing, and cough.  CT CHEST WITHOUT CONTRAST  Technique:  Multidetector CT imaging of the chest was performed following the standard protocol without IV contrast.  Comparison: Chest, 08/09/2012  Findings: Calcification of the thoracic aorta without aneurysm. Mild cardiac enlargement.  Calcification in the coronary arteries and mitral valve annulus.  Prominent mediastinal lymph nodes, largest in the pretracheal area measuring about 2 cm diameter. This lymphadenopathy is nonspecific and could be reactive  although metastatic disease is not excluded.  There are small bilateral pleural effusions.  There is patchy air space disease throughout both lungs more prominent centrally and with a nodular distribution pattern.  This is likely to represent edema or pneumonia.  However, underlying neoplasm is not excluded and follow up after resolution of acute process is recommended.  Airways appear patent.  No pneumothorax.  Slight interstitial fibrosis.  Small esophageal hiatal hernia.  Esophagus is mostly decompressed.  Visualized portions of the upper abdominal organs are grossly unremarkable except for vascular calcifications.  Degenerative changes in the thoracic spine.  IMPRESSION: Patchy nodular perihilar air space disease bilaterally suggesting infection or edema.  Prominent lymph nodes measuring up to 2 cm are indeterminate.  Recommend follow-up study after resolution of acute symptoms to exclude underlying neoplastic process.  Small bilateral pleural effusions with basilar atelectasis.  Vascular calcifications.  Original Report Authenticated By: Marlon Pel, M.D.   Nm Pulmonary Perf And Vent  08/09/2012  *RADIOLOGY REPORT*  Clinical Data:  Short of breath  NUCLEAR MEDICINE VENTILATION - PERFUSION LUNG SCAN  Technique:    Perfusion images were obtained in multiple projections after intravenous injection of Tc-81m MAA.  Radiopharmaceuticals:  3.0 mCi Tc-71m MAA.  Comparison: Chest radiograph 08/06 1013  Findings:  Perfusion: No wedge shaped peripheral perfusion defects to suggest acute pulmonary embolism.  There is small defect at the lung base with on the right which may relate to obstructive disease or air space disease seen on comparison radiograph.   IMPRESSION:  Very low probability for acute pulmonary embolism.  Chest radiograph suggests pneumonia.  Original Report Authenticated By: Genevive Bi, M.D.    Scheduled Meds:    . ipratropium  0.5 mg Nebulization Q4H   And  . albuterol  2.5 mg  Nebulization Q4H  . antiseptic oral rinse  15 mL Mouth Rinse BID  . atorvastatin  20 mg Oral QHS  . docusate sodium  100 mg Oral BID  . furosemide  40 mg Intravenous Once  . insulin aspart  0-5 Units Subcutaneous QHS  . insulin aspart  0-9 Units Subcutaneous TID WC  . insulin aspart  10 Units Subcutaneous TID WC  . insulin detemir  11 Units Subcutaneous QHS  . insulin detemir  30 Units Subcutaneous Daily  . levofloxacin (LEVAQUIN) IV  500 mg Intravenous Q24H  . levothyroxine  100 mcg Oral BH-q7a  . metoprolol succinate  25 mg Oral Daily  . warfarin  5 mg Oral Once  . warfarin  5 mg Oral ONCE-1800  . Warfarin - Pharmacist Dosing Inpatient   Does not apply q1800  . DISCONTD: azithromycin  250 mg Oral Daily  . DISCONTD: azithromycin  500 mg Oral Daily  . DISCONTD: cefTRIAXone (ROCEPHIN)  IV  1 g Intravenous Daily  . DISCONTD: diltiazem  360 mg Oral Daily  . DISCONTD: levofloxacin  500 mg Oral Daily  . DISCONTD: Tamsulosin HCl  0.4 mg Oral QPC supper   Continuous Infusions:    . albuterol Stopped (08/09/12 1851)    Principal Problem:  *Acute respiratory failure with hypoxia Active Problems:  Vulvar cancer, carcinoma  Atrial fibrillation with RVR  Diabetes mellitus  Hypertension  Hyperlipidemia  Obesity (BMI 30-39.9)  Hypothyroidism  CVA (cerebral infarction)  Community acquired pneumonia  DM type 2 causing CKD stage 3    Time spent: 40 minutes    Amiria Orrison  [facility] Pager [phone]. If 8PM-8AM, please contact night-coverage at [email or web address], password [email or web address] 08/10/2012, 4:45 PM  LOS: 1 day

## 2012-08-10 NOTE — ED Provider Notes (Signed)
Medical screening examination/treatment/procedure(s) were conducted as a shared visit with non-physician practitioner(s) and myself.  I personally evaluated the patient during the encounter  Flint Melter, MD 08/10/12 [phone]

## 2012-08-10 NOTE — ED Provider Notes (Signed)
Medical screening examination/treatment/procedure(s) were conducted as a shared visit with non-physician practitioner(s) and myself.  I personally evaluated the patient during the encounter  Patient with nonspecific symptoms of dyspnea. She has mild, diffuse rhonchi. Exam scattered wheezing. She has been recently seen by her PCP and cardiologist, yesterday.  She'll be evaluated for cardiac and pulmonary abnormality.  Flint Melter, MD 08/10/12 [phone]

## 2012-08-10 NOTE — Evaluation (Signed)
Physical Therapy Evaluation Patient Details Name: [name] MRN: [id number] DOB: 09/24/1926 Today's Date: 08/10/2012 Time: 0865-7846 PT Time Calculation (min): 23 min  PT Assessment / Plan / Recommendation Clinical Impression  Pt admitted with CAP. Pt states she was ambulating with cane at home after her CVA and has been out of SNF since June doing well at home with spouse and receiving HHPT. Pt demonstrates SOB with gait with sats dropping to 87% after 100' ambulation. Sats 98% on 2L at rest and able to maintain 95% on RA at rest during session prior to ambulation with gradual decline during ambulation. Pt will benefit from acute therapy to maximize mobility, transfers and gait prior to return home with spouse.     PT Assessment  Patient needs continued PT services    Follow Up Recommendations  Home health PT    Barriers to Discharge None      Equipment Recommendations  None recommended by PT    Recommendations for Other Services     Frequency Min 3X/week    Precautions / Restrictions Precautions Precautions: Fall   Pertinent Vitals/Pain No pain      Mobility  Bed Mobility Bed Mobility: Supine to Sit Supine to Sit: HOB elevated;With rails (HOB 15degrees) Transfers Transfers: Sit to Stand;Stand to Sit Sit to Stand: 5: Supervision;From bed;From toilet Stand to Sit: 5: Supervision;To toilet;To chair/3-in-1 Details for Transfer Assistance: cueing for hand placement Ambulation/Gait Ambulation/Gait Assistance: 5: Supervision Ambulation Distance (Feet): 110 Feet Assistive device: Rolling walker Ambulation/Gait Assistance Details: cueing for posture, position in RW, and breathing technique one standing rest grossly 20sec for breathing Gait Pattern: Step-through pattern;Decreased stride length;Trunk flexed Gait velocity: decreased Stairs: No    Exercises     PT Diagnosis: Difficulty walking  PT Problem List: Decreased activity tolerance;Decreased knowledge of use of  DME;Cardiopulmonary status limiting activity PT Treatment Interventions: Gait training;DME instruction;Stair training;Functional mobility training;Therapeutic activities;Therapeutic exercise;Patient/family education   PT Goals Acute Rehab PT Goals PT Goal Formulation: With patient/family Time For Goal Achievement: 08/17/12 Potential to Achieve Goals: Good Pt will go Supine/Side to Sit: with modified independence;with HOB 0 degrees PT Goal: Supine/Side to Sit - Progress: Goal set today Pt will go Sit to Supine/Side: with modified independence;with HOB 0 degrees PT Goal: Sit to Supine/Side - Progress: Goal set today Pt will go Sit to Stand: with modified independence PT Goal: Sit to Stand - Progress: Goal set today Pt will Ambulate: >150 feet;with modified independence;with least restrictive assistive device (with oxygen level 92% or greater) PT Goal: Ambulate - Progress: Goal set today Pt will Go Up / Down Stairs: 1-2 stairs;with min assist;with least restrictive assistive device PT Goal: Up/Down Stairs - Progress: Goal set today  Visit Information  Last PT Received On: 08/10/12 Assistance Needed: +1    Subjective Data  Subjective: I was doing fine til a couple days ago Patient Stated Goal: return home   Prior Functioning  Home Living Lives With: Spouse Available Help at Discharge: Family Type of Home: House Home Access: Stairs to enter Secretary/administrator of Steps: 2 Home Layout: One level Bathroom Shower/Tub: Engineer, manufacturing systems: Standard Home Adaptive Equipment: Environmental consultant - rolling;Straight cane Prior Function Level of Independence: Needs assistance Needs Assistance: Light Housekeeping;Meal Prep Meal Prep: Moderate Light Housekeeping: Maximal Able to Take Stairs?: Yes Driving: No Vocation: Retired Musician: No difficulties    Cognition  Overall Cognitive Status: Appears within functional limits for tasks  assessed/performed Arousal/Alertness: Awake/alert Orientation Level: Appears intact for tasks assessed  Behavior During Session: [facility] for tasks performed    Extremity/Trunk Assessment Right Lower Extremity Assessment RLE ROM/Strength/Tone: [facility] for tasks assessed Left Lower Extremity Assessment LLE ROM/Strength/Tone: [facility] for tasks assessed Trunk Assessment Trunk Assessment: Normal   Balance    End of Session PT - End of Session Activity Tolerance: Patient tolerated treatment well Patient left: in chair;with call bell/phone within reach;with family/visitor present Nurse Communication: Mobility status  GP     [name] 08/10/2012, 4:25 PM  [name], PT [phone]

## 2012-08-10 NOTE — ED Provider Notes (Signed)
Medical screening examination/treatment/procedure(s) were conducted as a shared visit with non-physician practitioner(s) and myself.  I personally evaluated the patient during the encounter  Delvin Hedeen L Alexsa Flaum, MD 08/10/12 1707 

## 2012-08-11 DIAGNOSIS — R0609 Other forms of dyspnea: Secondary | ICD-10-CM

## 2012-08-11 LAB — GLUCOSE, CAPILLARY
Glucose-Capillary: 128 mg/dL — ABNORMAL HIGH (ref 70–99)
Glucose-Capillary: 156 mg/dL — ABNORMAL HIGH (ref 70–99)
Glucose-Capillary: 167 mg/dL — ABNORMAL HIGH (ref 70–99)
Glucose-Capillary: 58 mg/dL — ABNORMAL LOW (ref 70–99)

## 2012-08-11 LAB — BASIC METABOLIC PANEL
CO2: 26 mEq/L (ref 19–32)
Calcium: 9.5 mg/dL (ref 8.4–10.5)
Chloride: 103 mEq/L (ref 96–112)
Creatinine, Ser: 1.38 mg/dL — ABNORMAL HIGH (ref 0.50–1.10)
GFR calc Af Amer: 40 mL/min — ABNORMAL LOW (ref >90)
Sodium: 141 mEq/L (ref 135–145)

## 2012-08-11 LAB — CBC
Platelets: 149 10*3/uL — ABNORMAL LOW (ref 150–400)
RDW: 15.4 % (ref 11.5–15.5)
WBC: 7.2 10*3/uL (ref 4.0–10.5)

## 2012-08-11 LAB — LEGIONELLA ANTIGEN, URINE: Legionella Antigen, Urine: NEGATIVE

## 2012-08-11 LAB — PROTIME-INR: Prothrombin Time: 22.8 seconds — ABNORMAL HIGH (ref 11.6–15.2)

## 2012-08-11 MED ORDER — FUROSEMIDE 10 MG/ML IJ SOLN
40.0000 mg | Freq: Once | INTRAMUSCULAR | Status: AC
  Administered 2012-08-11: 40 mg via INTRAVENOUS
  Filled 2012-08-11: qty 4

## 2012-08-11 MED ORDER — WARFARIN SODIUM 5 MG PO TABS
5.0000 mg | ORAL_TABLET | Freq: Once | ORAL | Status: AC
  Administered 2012-08-11: 5 mg via ORAL
  Filled 2012-08-11: qty 1

## 2012-08-11 MED ORDER — INSULIN DETEMIR 100 UNIT/ML ~~LOC~~ SOLN
20.0000 [IU] | Freq: Every day | SUBCUTANEOUS | Status: DC
  Administered 2012-08-12: 20 [IU] via SUBCUTANEOUS
  Filled 2012-08-11: qty 10

## 2012-08-11 MED ORDER — OXYCODONE-ACETAMINOPHEN 5-325 MG PO TABS
1.0000 | ORAL_TABLET | Freq: Three times a day (TID) | ORAL | Status: DC | PRN
  Administered 2012-08-11 – 2012-08-13 (×4): 1 via ORAL
  Filled 2012-08-11 (×4): qty 1

## 2012-08-11 MED ORDER — DILTIAZEM HCL ER COATED BEADS 240 MG PO CP24
240.0000 mg | ORAL_CAPSULE | Freq: Every day | ORAL | Status: DC
  Administered 2012-08-11: 240 mg via ORAL
  Filled 2012-08-11 (×2): qty 1

## 2012-08-11 MED ORDER — INSULIN DETEMIR 100 UNIT/ML ~~LOC~~ SOLN
25.0000 [IU] | Freq: Every day | SUBCUTANEOUS | Status: DC
  Filled 2012-08-11: qty 10

## 2012-08-11 MED ORDER — METOPROLOL SUCCINATE ER 50 MG PO TB24
50.0000 mg | ORAL_TABLET | Freq: Every day | ORAL | Status: DC
  Administered 2012-08-12 – 2012-08-13 (×2): 50 mg via ORAL
  Filled 2012-08-11 (×2): qty 1

## 2012-08-11 MED ORDER — INSULIN DETEMIR 100 UNIT/ML ~~LOC~~ SOLN
10.0000 [IU] | Freq: Every day | SUBCUTANEOUS | Status: DC
  Administered 2012-08-11: 10 [IU] via SUBCUTANEOUS

## 2012-08-11 MED ORDER — ALBUTEROL SULFATE (5 MG/ML) 0.5% IN NEBU: 2.5000 mg | INHALATION_SOLUTION | RESPIRATORY_TRACT | Status: DC | PRN

## 2012-08-11 MED ORDER — INSULIN ASPART 100 UNIT/ML ~~LOC~~ SOLN
8.0000 [IU] | Freq: Three times a day (TID) | SUBCUTANEOUS | Status: DC
  Administered 2012-08-12: 8 [IU] via SUBCUTANEOUS

## 2012-08-11 NOTE — Progress Notes (Signed)
ANTICOAGULATION CONSULT NOTE - Follow Up Consult  Pharmacy Consult for warfarin Indication: hx of  stroke  No Known Allergies  Patient Measurements: Height: 5\' 3"  (160 cm) Weight: 123 lb 10.9 oz (56.1 kg) IBW/kg (Calculated) : 52.4    Vital Signs: Temp: 97.7 F (36.5 C) (08/18 0513) Temp src: Oral (08/18 0513) BP: 144/88 mmHg (08/18 0513) Pulse Rate: 128  (08/18 0513)  Labs:  Alvira Philips 08/11/12 0617 08/10/12 0556 08/09/12 2049 08/09/12 2021  HGB -- 9.7* 10.0* --  HCT -- 30.6* 32.0* --  PLT -- 126* 137* --  APTT -- -- -- --  LABPROT 22.8* 21.3* -- 20.1*  INR 1.97* 1.81* -- 1.68*  HEPARINUNFRC -- -- -- --  CREATININE -- 1.79* 1.96* --  CKTOTAL -- -- -- --  CKMB -- -- -- --  TROPONINI -- -- -- --    Estimated Creatinine Clearance: 19.7 ml/min (by C-G formula based on Cr of 1.79).   Medications:  Prescriptions prior to admission  Medication Sig Dispense Refill  . atorvastatin (LIPITOR) 20 MG tablet Take 20 mg by mouth at bedtime.       [name] diltiazem (CARDIZEM CD) 360 MG 24 hr capsule Take 360 mg by mouth daily.      . ergocalciferol (VITAMIN D2) 50000 UNITS capsule Take 50,000 Units by mouth once a week. Monday      . insulin aspart (NOVOLOG) 100 UNIT/ML injection Inject 16-26 Units into the skin 3 (three) times daily before meals. 16 units in the am, 16 at lunch, and 26 at supper      . insulin detemir (LEVEMIR) 100 UNIT/ML injection Inject 16-44 Units into the skin 2 (two) times daily. 44 units at breakfast, 16 in the evening      . levothyroxine (SYNTHROID, LEVOTHROID) 100 MCG tablet Take 100 mcg by mouth every morning.       . metoprolol succinate (TOPROL-XL) 25 MG 24 hr tablet Take 25 mg by mouth daily.      . Tamsulosin HCl (FLOMAX) 0.4 MG CAPS Take 0.4 mg by mouth daily after supper.      . warfarin (COUMADIN) 5 MG tablet Take 2.5-5 mg by mouth daily. Takes 5mg  daily except 2.5mg  on Fridays        Assessment: Patient is an 78 y.o F on Coumadin PTA for hx CVA. She  c/o SOB and was sent here by her cardiologist for r/o PE. V/Q scan with "very low probability" for PE, but CXR suspicious for PNA. Per patient, home Coumadin is 5mg  daily except 2.5mg  on Fridays. INR yesterday was1.81 yesterday and is 1.97 today.    Goal of Therapy:  INR 2-3 Monitor platelets by anticoagulation protocol: Yes   Plan:  1) coumadin 5mg  PO x1 today  2) f/u INR and adjust dose as needed    Vania Rea. Darin Engels.D. Clinical Pharmacist Pager [phone] Phone [phone] 08/11/2012 8:36 AM

## 2012-08-11 NOTE — Progress Notes (Signed)
[facility] PROGRESS NOTE  CARL BLEECKER ZOX:[id number] DOB: 1926/06/24 DOA: 08/09/2012 PCP: [name], MD  Assessment/Plan: Principal Problem:  *Acute respiratory failure with hypoxia Active Problems:  Vulvar cancer, carcinoma  Atrial fibrillation with RVR  Diabetes mellitus  Hypertension  Hyperlipidemia  Obesity (BMI 30-39.9)  Hypothyroidism  CVA (cerebral infarction)  Community acquired pneumonia  DM type 2 causing CKD stage 3  Anemia  Thrombocytopenia  1. Right lower lobe pneumonia-location raises suspicion for aspiration. After reviewing CT scan of the chest there are clear changes of chronic bronchitis. We'll treat with IV Levaquin and follow closely 2. Hypoxic respiratory failure-I am unsure if pneumonia is the only etiology. proBNP is elevated. VQ scan is very low prob. We'll continue supplemental oxygen and added IV diuretic daily on 8/17 3. Diabetes mellitus - according to patient she had recent hypoglycemia. She continued to have hypoglycemia in house - will continue to decrease the dose of levemir and novolog  4. Recent surgery for vulvar cancer - patient reports that the healing has been completed and that she does not have any further voiding problems 5. Chronic kidney disease stage III suspect due to diabetic nephropathy. Last normal creatinine the computer is from 2009 6. Recent stroke - she has recuperated quite well. Continue Coumadin for secondary prophylaxis 7. Hypothyroidism-continue home dose Synthroid 8. Anemia and thrombocytopenia - no clear etiology. Will check a vitamin B 12 level 9. Chronic atrial fibrillation-rate controlled on beta blocker and calcium channel blocker. Patient is on chronic Coumadin  Code Status: Full Family Communication: Husband at bedside  Disposition Plan: Home   Brief narrative: The patient is an 78 year old female with history of vulvar cancer, diabetes, HTN, HL, hypothyroidism, recurrent CVA now with left facial droop and  mild balance problems who presents with a two month history of shortness of breath.  The patient states that over the last two months, she has had slowly progressive shortness of breath with increased wheezing, particularly in the mornings. She has also had a cough productive of clear phlegm. She denies fevers, chills, night sweats. She has been somewhat fatigued. Unsure if lying flat worsens her dyspnea, and denies LEE. She previously was able to ambulate around her house without difficulty, but currently is short of breath resting in bed. She has had some mild sinus congestion with clear rhinorrhea during this time without eye itching.  Incidentally, patient had episode of hypoglycemia to 61 last night.  In ER: Patient received CXR consistent with perihilar infiltrate and V/Q which was negative for PE. She was hypoxic to the mid-80s with tachypnea requiring oxygen. Received nebulizer treatment and levofloxacin in ER.    Consultants:  None  Procedures:  VQ scan  CT chest without contrast    Antibiotics:  Levaquin August 17  HPI/Subjective: She says she feels better  Objective: Filed Vitals:   08/10/12 2045 08/10/12 2122 08/11/12 0513 08/11/12 1300  BP:  145/81 144/88 132/79  Pulse:  95 128 111  Temp:  98.5 F (36.9 C) 97.7 F (36.5 C) 97.6 F (36.4 C)  TempSrc:  Oral Oral   Resp:  20 20 20   Height:      Weight:      SpO2: 96% 95% 96% 95%    Intake/Output Summary (Last 24 hours) at 08/11/12 1454 Last data filed at 08/11/12 0933  Gross per 24 hour  Intake    340 ml  Output    400 ml  Net    -60 ml   [employer]  08/09/12 2227  Weight: 56.1 kg (123 lb 10.9 oz)    Exam:   General:  Alert and oriented x3  Cardiovascular: Irregularly irregular  Respiratory: Fine crackles right base-unable to speak in full sentences  Abdomen: Soft, distended, nontender  Data Reviewed: Basic Metabolic Panel:  Lab 08/11/12 4098 08/10/12 0556 08/09/12 2049  NA 141 138 135   K 3.9 4.0 4.3  CL 103 102 98  CO2 26 24 23   GLUCOSE 183* 132* 332*  BUN 28* 41* 43*  CREATININE 1.38* 1.79* 1.96*  CALCIUM 9.5 9.2 8.8  MG -- -- --  PHOS -- -- --   Liver Function Tests: No results found for this basename: AST:5,ALT:5,ALKPHOS:5,BILITOT:5,PROT:5,ALBUMIN:5 in the last 168 hours No results found for this basename: LIPASE:5,AMYLASE:5 in the last 168 hours No results found for this basename: AMMONIA:5 in the last 168 hours CBC:  Lab 08/11/12 1106 08/10/12 0556 08/09/12 2049  WBC 7.2 8.0 12.0*  NEUTROABS -- -- 10.5*  HGB 10.5* 9.7* 10.0*  HCT 33.5* 30.6* 32.0*  MCV 94.1 95.0 95.2  PLT 149* 126* 137*   Cardiac Enzymes: No results found for this basename: CKTOTAL:5,CKMB:5,CKMBINDEX:5,TROPONINI:5 in the last 168 hours BNP (last 3 results)  Basename 08/10/12 0824 09/20/11 1342  PROBNP 2235.0* 2866.0*   CBG:  Lab 08/11/12 1140 08/11/12 0742 08/11/12 0036 08/10/12 2206 08/10/12 2115  GLUCAP 167* 128* 156* 112* 66*    Recent Results (from the past 240 hour(s))  CULTURE, BLOOD (ROUTINE X 2)     Status: Normal (Preliminary result)   Collection Time   08/09/12 10:40 PM      Component Value Range Status Comment   Specimen Description BLOOD LEFT ARM   Final    Special Requests BOTTLES DRAWN AEROBIC AND ANAEROBIC 10CC   Final    Culture  Setup Time 08/10/2012 05:10   Final    Culture     Final    Value:        BLOOD CULTURE RECEIVED NO GROWTH TO DATE CULTURE WILL BE HELD FOR 5 DAYS BEFORE ISSUING A FINAL NEGATIVE REPORT   Report Status PENDING   Incomplete   CULTURE, BLOOD (ROUTINE X 2)     Status: Normal (Preliminary result)   Collection Time   08/09/12 10:50 PM      Component Value Range Status Comment   Specimen Description BLOOD RIGHT ARM   Final    Special Requests BOTTLES DRAWN AEROBIC AND ANAEROBIC 10CC   Final    Culture  Setup Time 08/10/2012 05:11   Final    Culture     Final    Value:        BLOOD CULTURE RECEIVED NO GROWTH TO DATE CULTURE WILL BE HELD  FOR 5 DAYS BEFORE ISSUING A FINAL NEGATIVE REPORT   Report Status PENDING   Incomplete      Studies: Dg Chest 2 View  08/09/2012  *RADIOLOGY REPORT*  Clinical Data: Shortness of breath.  CHEST - 2 VIEW  Comparison: 04/23/2012  Findings: Lungs are hypoinflated demonstrate a patchy air space process of the central right lung and perihilar region.  There is no effusion.  The cardiomediastinal silhouette and remainder of the exam is unchanged.  IMPRESSION: Patchy air space process of the right perihilar region and central right lung suggesting infection.  Original Report Authenticated By: Elba Barman, M.D.   Ct Chest Wo Contrast  08/10/2012  *RADIOLOGY REPORT*  Clinical Data: Progressive shortness of breath, wheezing, and cough.  CT CHEST WITHOUT  CONTRAST  Technique:  Multidetector CT imaging of the chest was performed following the standard protocol without IV contrast.  Comparison: Chest, 08/09/2012  Findings: Calcification of the thoracic aorta without aneurysm. Mild cardiac enlargement.  Calcification in the coronary arteries and mitral valve annulus.  Prominent mediastinal lymph nodes, largest in the pretracheal area measuring about 2 cm diameter. This lymphadenopathy is nonspecific and could be reactive although metastatic disease is not excluded.  There are small bilateral pleural effusions.  There is patchy air space disease throughout both lungs more prominent centrally and with a nodular distribution pattern.  This is likely to represent edema or pneumonia.  However, underlying neoplasm is not excluded and follow up after resolution of acute process is recommended.  Airways appear patent.  No pneumothorax.  Slight interstitial fibrosis.  Small esophageal hiatal hernia.  Esophagus is mostly decompressed.  Visualized portions of the upper abdominal organs are grossly unremarkable except for vascular calcifications.  Degenerative changes in the thoracic spine.  IMPRESSION: Patchy nodular perihilar  air space disease bilaterally suggesting infection or edema.  Prominent lymph nodes measuring up to 2 cm are indeterminate.  Recommend follow-up study after resolution of acute symptoms to exclude underlying neoplastic process.  Small bilateral pleural effusions with basilar atelectasis.  Vascular calcifications.  Original Report Authenticated By: Marlon Pel, M.D.   Nm Pulmonary Perf And Vent  08/09/2012  *RADIOLOGY REPORT*  Clinical Data:  Short of breath  NUCLEAR MEDICINE VENTILATION - PERFUSION LUNG SCAN  Technique:    Perfusion images were obtained in multiple projections after intravenous injection of Tc-77m MAA.  Radiopharmaceuticals: 3.0 mCi Tc-80m MAA.  Comparison: Chest radiograph 08/06 1013  Findings:  Perfusion: No wedge shaped peripheral perfusion defects to suggest acute pulmonary embolism.  There is small defect at the lung base with on the right which may relate to obstructive disease or air space disease seen on comparison radiograph.   IMPRESSION:  Very low probability for acute pulmonary embolism.  Chest radiograph suggests pneumonia.  Original Report Authenticated By: Genevive Bi, M.D.    Scheduled Meds:    . antiseptic oral rinse  15 mL Mouth Rinse BID  . atorvastatin  20 mg Oral QHS  . docusate sodium  100 mg Oral BID  . furosemide  40 mg Intravenous Once  . insulin aspart  0-5 Units Subcutaneous QHS  . insulin aspart  0-9 Units Subcutaneous TID WC  . insulin aspart  10 Units Subcutaneous TID WC  . insulin detemir  11 Units Subcutaneous QHS  . insulin detemir  30 Units Subcutaneous Daily  . levofloxacin (LEVAQUIN) IV  500 mg Intravenous Q24H  . levothyroxine  100 mcg Oral BH-q7a  . metoprolol succinate  25 mg Oral Daily  . warfarin  5 mg Oral ONCE-1800  . warfarin  5 mg Oral ONCE-1800  . Warfarin - Pharmacist Dosing Inpatient   Does not apply q1800  . DISCONTD: albuterol  2.5 mg Nebulization Q4H  . DISCONTD: diltiazem  360 mg Oral Daily  . DISCONTD:  ipratropium  0.5 mg Nebulization Q4H  . DISCONTD: Tamsulosin HCl  0.4 mg Oral QPC supper   Continuous Infusions:    Principal Problem:  *Acute respiratory failure with hypoxia Active Problems:  Vulvar cancer, carcinoma  Atrial fibrillation with RVR  Diabetes mellitus  Hypertension  Hyperlipidemia  Obesity (BMI 30-39.9)  Hypothyroidism  CVA (cerebral infarction)  Community acquired pneumonia  DM type 2 causing CKD stage 3  Anemia  Thrombocytopenia    Time spent:  40 minutes    Ashanti Littles  [facility] Pager [phone]. If 8PM-8AM, please contact night-coverage at [email or web address], password [facility] 08/11/2012, 2:54 PM  LOS: 2 days

## 2012-08-12 LAB — BASIC METABOLIC PANEL
BUN: 29 mg/dL — ABNORMAL HIGH (ref 6–23)
CO2: 32 mEq/L (ref 19–32)
Calcium: 9.9 mg/dL (ref 8.4–10.5)
Chloride: 99 mEq/L (ref 96–112)
Creatinine, Ser: 1.69 mg/dL — ABNORMAL HIGH (ref 0.50–1.10)

## 2012-08-12 LAB — GLUCOSE, CAPILLARY
Glucose-Capillary: 114 mg/dL — ABNORMAL HIGH (ref 70–99)
Glucose-Capillary: 58 mg/dL — ABNORMAL LOW (ref 70–99)
Glucose-Capillary: 85 mg/dL (ref 70–99)
Glucose-Capillary: 116 mg/dL — ABNORMAL HIGH (ref 70–99)

## 2012-08-12 MED ORDER — INSULIN DETEMIR 100 UNIT/ML ~~LOC~~ SOLN
15.0000 [IU] | Freq: Every day | SUBCUTANEOUS | Status: DC
  Administered 2012-08-13: 15 [IU] via SUBCUTANEOUS

## 2012-08-12 MED ORDER — POTASSIUM CHLORIDE CRYS ER 10 MEQ PO TBCR
10.0000 meq | EXTENDED_RELEASE_TABLET | Freq: Once | ORAL | Status: AC
  Administered 2012-08-12: 10 meq via ORAL
  Filled 2012-08-12: qty 1

## 2012-08-12 MED ORDER — WARFARIN SODIUM 5 MG PO TABS
5.0000 mg | ORAL_TABLET | ORAL | Status: AC
  Administered 2012-08-12: 5 mg via ORAL
  Filled 2012-08-12 (×2): qty 1

## 2012-08-12 MED ORDER — DILTIAZEM HCL ER COATED BEADS 240 MG PO CP24
240.0000 mg | ORAL_CAPSULE | ORAL | Status: DC
  Administered 2012-08-12: 240 mg via ORAL
  Filled 2012-08-12 (×2): qty 1

## 2012-08-12 MED ORDER — LEVOFLOXACIN IN D5W 250 MG/50ML IV SOLN
250.0000 mg | INTRAVENOUS | Status: DC
  Administered 2012-08-13: 250 mg via INTRAVENOUS
  Filled 2012-08-12: qty 50

## 2012-08-12 MED ORDER — FUROSEMIDE 10 MG/ML IJ SOLN
40.0000 mg | Freq: Once | INTRAMUSCULAR | Status: AC
  Administered 2012-08-12: 40 mg via INTRAVENOUS
  Filled 2012-08-12: qty 4

## 2012-08-12 MED ORDER — MAGNESIUM CHLORIDE 64 MG PO TBEC
1.0000 | DELAYED_RELEASE_TABLET | Freq: Every day | ORAL | Status: DC
  Administered 2012-08-12 – 2012-08-13 (×2): 64 mg via ORAL
  Filled 2012-08-12 (×2): qty 1

## 2012-08-12 MED ORDER — INSULIN ASPART 100 UNIT/ML ~~LOC~~ SOLN
6.0000 [IU] | Freq: Three times a day (TID) | SUBCUTANEOUS | Status: DC
  Administered 2012-08-12 – 2012-08-13 (×3): 6 [IU] via SUBCUTANEOUS

## 2012-08-12 NOTE — Progress Notes (Signed)
[facility] PROGRESS NOTE  [name]:[id number] DOB: 10-Jul-1926 DOA: 08/09/2012 PCP: [name], MD  Assessment/Plan: Principal Problem:  *Acute respiratory failure with hypoxia Active Problems:  Vulvar cancer, carcinoma  Atrial fibrillation with RVR  Diabetes mellitus  Hypertension  Hyperlipidemia  Obesity (BMI 30-39.9)  Hypothyroidism  CVA (cerebral infarction)  Community acquired pneumonia  DM type 2 causing CKD stage 3  Anemia  Thrombocytopenia  1. Right lower lobe pneumonia-location raises suspicion for aspiration. After reviewing CT scan of the chest there are clear changes of chronic bronchitis. We'll treat with IV Levaquin and follow closely 2. Hypoxic respiratory failure-I am unsure if pneumonia is the only etiology. proBNP is elevated. VQ scan is very low prob. We'll continue supplemental oxygen and added IV diuretic daily on 8/17 3. Diabetes mellitus - according to patient she had recent hypoglycemia. She continued to have hypoglycemia in house - will continue to decrease the dose of levemir and novolog  4. Recent surgery for vulvar cancer - patient reports that the healing has been completed and that she does not have any further voiding problems 5. Chronic kidney disease stage III suspect due to diabetic nephropathy. Last normal creatinine the computer is from 2009 6. Recent stroke - she has recuperated quite well. Continue Coumadin for secondary prophylaxis 7. Hypothyroidism-continue home dose Synthroid 8. Anemia and thrombocytopenia - no clear etiology. Will check a vitamin B 12 level 9. Chronic atrial fibrillation-rate controlled on beta blocker and calcium channel blocker. Patient is on chronic Coumadin  Code Status: Full Family Communication: Husband at bedside  Disposition Plan: Home   Brief narrative: The patient is an 78 year old female with history of vulvar cancer, diabetes, HTN, HL, hypothyroidism, recurrent CVA now with left facial droop and  mild balance problems who presents with a two month history of shortness of breath.  The patient states that over the last two months, she has had slowly progressive shortness of breath with increased wheezing, particularly in the mornings. She has also had a cough productive of clear phlegm. She denies fevers, chills, night sweats. She has been somewhat fatigued. Unsure if lying flat worsens her dyspnea, and denies LEE. She previously was able to ambulate around her house without difficulty, but currently is short of breath resting in bed. She has had some mild sinus congestion with clear rhinorrhea during this time without eye itching.  Incidentally, patient had episode of hypoglycemia to 61 last night.  In ER: Patient received CXR consistent with perihilar infiltrate and V/Q which was negative for PE. She was hypoxic to the mid-80s with tachypnea requiring oxygen. Received nebulizer treatment and levofloxacin in ER.    Consultants:  None  Procedures:  VQ scan  CT chest without contrast    Antibiotics:  Levaquin August 17  HPI/Subjective: Improving slowly  Objective: Filed Vitals:   08/12/12 0914 08/12/12 0952 08/12/12 1300 08/12/12 1758  BP:  97/66 153/83 120/74  Pulse: 122 109 93   Temp:   98 F (36.7 C)   TempSrc:   Oral   Resp:   18   Height:      Weight:      SpO2: 91%  96%     Intake/Output Summary (Last 24 hours) at 08/12/12 1830 Last data filed at 08/12/12 1700  Gross per 24 hour  Intake    580 ml  Output    600 ml  Net    -20 ml   Filed Weights   08/09/12 2227  Weight: 56.1 kg (123  lb 10.9 oz)    Exam:   General:  Alert and oriented x3  Cardiovascular: Irregularly irregular  Respiratory: Fine crackles right base-unable to speak in full sentences  Abdomen: Soft, distended, nontender  Data Reviewed: Basic Metabolic Panel:  Lab 08/12/12 1610 08/11/12 1106 08/10/12 0556 08/09/12 2049  NA 142 141 138 135  K 4.0 3.9 4.0 4.3  CL 99 103 102 98    CO2 32 26 24 23   GLUCOSE 160* 183* 132* 332*  BUN 29* 28* 41* 43*  CREATININE 1.69* 1.38* 1.79* 1.96*  CALCIUM 9.9 9.5 9.2 8.8  MG -- -- -- --  PHOS -- -- -- --   Liver Function Tests: No results found for this basename: AST:5,ALT:5,ALKPHOS:5,BILITOT:5,PROT:5,ALBUMIN:5 in the last 168 hours No results found for this basename: LIPASE:5,AMYLASE:5 in the last 168 hours No results found for this basename: AMMONIA:5 in the last 168 hours CBC:  Lab 08/11/12 1106 08/10/12 0556 08/09/12 2049  WBC 7.2 8.0 12.0*  NEUTROABS -- -- 10.5*  HGB 10.5* 9.7* 10.0*  HCT 33.5* 30.6* 32.0*  MCV 94.1 95.0 95.2  PLT 149* 126* 137*   Cardiac Enzymes: No results found for this basename: CKTOTAL:5,CKMB:5,CKMBINDEX:5,TROPONINI:5 in the last 168 hours BNP (last 3 results)  Basename 08/10/12 0824 09/20/11 1342  PROBNP 2235.0* 2866.0*   CBG:  Lab 08/12/12 1653 08/12/12 1257 08/12/12 1232 08/12/12 0750 08/11/12 2139  GLUCAP 116* 85 58* 114* 99    Recent Results (from the past 240 hour(s))  CULTURE, BLOOD (ROUTINE X 2)     Status: Normal (Preliminary result)   Collection Time   08/09/12 10:40 PM      Component Value Range Status Comment   Specimen Description BLOOD LEFT ARM   Final    Special Requests BOTTLES DRAWN AEROBIC AND ANAEROBIC 10CC   Final    Culture  Setup Time 08/10/2012 05:10   Final    Culture     Final    Value:        BLOOD CULTURE RECEIVED NO GROWTH TO DATE CULTURE WILL BE HELD FOR 5 DAYS BEFORE ISSUING A FINAL NEGATIVE REPORT   Report Status PENDING   Incomplete   CULTURE, BLOOD (ROUTINE X 2)     Status: Normal (Preliminary result)   Collection Time   08/09/12 10:50 PM      Component Value Range Status Comment   Specimen Description BLOOD RIGHT ARM   Final    Special Requests BOTTLES DRAWN AEROBIC AND ANAEROBIC 10CC   Final    Culture  Setup Time 08/10/2012 05:11   Final    Culture     Final    Value:        BLOOD CULTURE RECEIVED NO GROWTH TO DATE CULTURE WILL BE HELD FOR 5  DAYS BEFORE ISSUING A FINAL NEGATIVE REPORT   Report Status PENDING   Incomplete      Studies: Dg Chest 2 View  08/09/2012  *RADIOLOGY REPORT*  Clinical Data: Shortness of breath.  CHEST - 2 VIEW  Comparison: 04/23/2012  Findings: Lungs are hypoinflated demonstrate a patchy air space process of the central right lung and perihilar region.  There is no effusion.  The cardiomediastinal silhouette and remainder of the exam is unchanged.  IMPRESSION: Patchy air space process of the right perihilar region and central right lung suggesting infection.  Original Report Authenticated By: Elba Barman, M.D.   Ct Chest Wo Contrast  08/10/2012  *RADIOLOGY REPORT*  Clinical Data: Progressive shortness of breath, wheezing,  and cough.  CT CHEST WITHOUT CONTRAST  Technique:  Multidetector CT imaging of the chest was performed following the standard protocol without IV contrast.  Comparison: Chest, 08/09/2012  Findings: Calcification of the thoracic aorta without aneurysm. Mild cardiac enlargement.  Calcification in the coronary arteries and mitral valve annulus.  Prominent mediastinal lymph nodes, largest in the pretracheal area measuring about 2 cm diameter. This lymphadenopathy is nonspecific and could be reactive although metastatic disease is not excluded.  There are small bilateral pleural effusions.  There is patchy air space disease throughout both lungs more prominent centrally and with a nodular distribution pattern.  This is likely to represent edema or pneumonia.  However, underlying neoplasm is not excluded and follow up after resolution of acute process is recommended.  Airways appear patent.  No pneumothorax.  Slight interstitial fibrosis.  Small esophageal hiatal hernia.  Esophagus is mostly decompressed.  Visualized portions of the upper abdominal organs are grossly unremarkable except for vascular calcifications.  Degenerative changes in the thoracic spine.  IMPRESSION: Patchy nodular perihilar air  space disease bilaterally suggesting infection or edema.  Prominent lymph nodes measuring up to 2 cm are indeterminate.  Recommend follow-up study after resolution of acute symptoms to exclude underlying neoplastic process.  Small bilateral pleural effusions with basilar atelectasis.  Vascular calcifications.  Original Report Authenticated By: Marlon Pel, M.D.   Nm Pulmonary Perf And Vent  08/09/2012  *RADIOLOGY REPORT*  Clinical Data:  Short of breath  NUCLEAR MEDICINE VENTILATION - PERFUSION LUNG SCAN  Technique:    Perfusion images were obtained in multiple projections after intravenous injection of Tc-58m MAA.  Radiopharmaceuticals: 3.0 mCi Tc-26m MAA.  Comparison: Chest radiograph 08/06 1013  Findings:  Perfusion: No wedge shaped peripheral perfusion defects to suggest acute pulmonary embolism.  There is small defect at the lung base with on the right which may relate to obstructive disease or air space disease seen on comparison radiograph.   IMPRESSION:  Very low probability for acute pulmonary embolism.  Chest radiograph suggests pneumonia.  Original Report Authenticated By: Genevive Bi, M.D.    Scheduled Meds:    . antiseptic oral rinse  15 mL Mouth Rinse BID  . atorvastatin  20 mg Oral QHS  . diltiazem  240 mg Oral Q24H  . docusate sodium  100 mg Oral BID  . furosemide  40 mg Intravenous Once  . insulin aspart  0-5 Units Subcutaneous QHS  . insulin aspart  0-9 Units Subcutaneous TID WC  . insulin aspart  6 Units Subcutaneous TID WC  . insulin detemir  15 Units Subcutaneous Daily  . levofloxacin (LEVAQUIN) IV  250 mg Intravenous Q24H  . levothyroxine  100 mcg Oral BH-q7a  . magnesium chloride  1 tablet Oral Daily  . metoprolol succinate  50 mg Oral Daily  . potassium chloride  10 mEq Oral Once  . warfarin  5 mg Oral NOW  . Warfarin - Pharmacist Dosing Inpatient   Does not apply q1800  . DISCONTD: diltiazem  240 mg Oral Daily  . DISCONTD: insulin aspart  8 Units  Subcutaneous TID WC  . DISCONTD: insulin detemir  10 Units Subcutaneous QHS  . DISCONTD: insulin detemir  20 Units Subcutaneous Daily  . DISCONTD: insulin detemir  25 Units Subcutaneous Daily  . DISCONTD: levofloxacin (LEVAQUIN) IV  500 mg Intravenous Q24H  . DISCONTD: metoprolol succinate  25 mg Oral Daily   Continuous Infusions:    Principal Problem:  *Acute respiratory failure with hypoxia Active  Problems:  Vulvar cancer, carcinoma  Atrial fibrillation with RVR  Diabetes mellitus  Hypertension  Hyperlipidemia  Obesity (BMI 30-39.9)  Hypothyroidism  CVA (cerebral infarction)  Community acquired pneumonia  DM type 2 causing CKD stage 3  Anemia  Thrombocytopenia    Time spent: 20 minutes    Zanovia Rotz  [facility] Pager [phone]. If 8PM-8AM, please contact night-coverage at [email or web address], password [facility] 08/12/2012, 6:30 PM  LOS: 3 days

## 2012-08-12 NOTE — Progress Notes (Signed)
Hypoglycemic Event  CBG: 58  Treatment: 15 GM carbohydrate snack  Symptoms: None  Follow-up CBG: Time:1257 CBG Result: 85  Possible Reasons for Event: Unknown  Comments/MD notified:Dr. Lavera Guise texted paged and new orders received.    [name], [name]  Remember to initiate Hypoglycemia Order Set & complete

## 2012-08-12 NOTE — Progress Notes (Signed)
VSS - Blood pressure 97/66, pulse 109, temperature 97.5 F (36.4 C), temperature source Oral, resp. rate 20, height 5\' 3"  (1.6 m), weight 56.1 kg (123 lb 10.9 oz), SpO2 91.00%., O2   @ 1 L. Pt. Scheduled to receive Cardizem 240 mg and Metoprolol 50 mg.  Dr. Lavera Guise informed and stated need to reschedule Cardizem to same time yesterday and ok to give Metoprolol.  Will continue to monitor.  Forbes Cellar, RN

## 2012-08-12 NOTE — Progress Notes (Signed)
ANTICOAGULATION CONSULT NOTE - Follow Up Consult  Pharmacy Consult for warfarin Indication: hx of  stroke  Labs:  Basename 08/12/12 0830 08/11/12 1106 08/11/12 0617 08/10/12 0556 08/09/12 2049  HGB -- 10.5* -- 9.7* --  HCT -- 33.5* -- 30.6* 32.0*  PLT -- 149* -- 126* 137*  APTT -- -- -- -- --  LABPROT 27.1* -- 22.8* 21.3* --  INR 2.46* -- 1.97* 1.81* --  HEPARINUNFRC -- -- -- -- --  CREATININE 1.69* 1.38* -- 1.79* --  CKTOTAL -- -- -- -- --  CKMB -- -- -- -- --  TROPONINI -- -- -- -- --   Medications:  Prescriptions prior to admission  Medication Sig Dispense Refill  . atorvastatin (LIPITOR) 20 MG tablet Take 20 mg by mouth at bedtime.       [name] diltiazem (CARDIZEM CD) 360 MG 24 hr capsule Take 360 mg by mouth daily.      . ergocalciferol (VITAMIN D2) 50000 UNITS capsule Take 50,000 Units by mouth once a week. Monday      . insulin aspart (NOVOLOG) 100 UNIT/ML injection Inject 16-26 Units into the skin 3 (three) times daily before meals. 16 units in the am, 16 at lunch, and 26 at supper      . insulin detemir (LEVEMIR) 100 UNIT/ML injection Inject 16-44 Units into the skin 2 (two) times daily. 44 units at breakfast, 16 in the evening      . levothyroxine (SYNTHROID, LEVOTHROID) 100 MCG tablet Take 100 mcg by mouth every morning.       . metoprolol succinate (TOPROL-XL) 25 MG 24 hr tablet Take 25 mg by mouth daily.      . Tamsulosin HCl (FLOMAX) 0.4 MG CAPS Take 0.4 mg by mouth daily after supper.      . warfarin (COUMADIN) 5 MG tablet Take 2.5-5 mg by mouth daily. Takes 5mg  daily except 2.5mg  on Fridays       Assessment: Patient is an 78 y.o F on Coumadin PTA for hx CVA. She c/o SOB and was sent here by her cardiologist for r/o PE. V/Q scan with "very low probability" for PE, but CXR suspicious for PNA.   HOME: Coumadin 5mg  daily except 2.5mg  on Fridays.   INR is 2.46 and within desired goal range.  No new CBC today but no noted s/s of bleeding complications with  anticoagulation.  Medication Review: 1.  Levofloxacin + Warfarin - Some quinolone antibiotics have been reported to potentiate the hypoprothrombinemic effect of warfarin and other coumarin anticoagulants. The exact mechanism is unknown but may involve inhibition of coumarin metabolism and/or depletion of certain clotting factors due to suppression of vitamin K-producing intestinal flora.   Hassell Done, [place] "Levofloxacin and warfarin interaction." Ann Pharmacother 36 (2002): 1554-7   Goal of Therapy:  INR 2-3 Monitor platelets by anticoagulation protocol: Yes   Plan:   Will continue home regimen for now but may need to decrease if INR continues upward trend.  Monitory closely for s/s of bleeding  F/u length of therapy with IV Levofloxacin and potential drug/drug interaction.   Will adjust dose of Levofloxacin due to CrCl of ~ 23ml/min.  Nadara Mustard, PharmD., MS Clinical Pharmacist Pager:  [phone]  Thank you for allowing pharmacy to be part of this patients care team.  08/12/2012 10:57 AM

## 2012-08-12 NOTE — Progress Notes (Signed)
Physical Therapy Treatment Patient Details Name: [name] MRN: [id number] DOB: [birth year] Today's Date: 08/12/2012 Time: 4098-1191 PT Time Calculation (min): 26 min  PT Assessment / Plan / Recommendation Comments on Treatment Session  Pt admitted with CAP and is progressing with mobility. Pt maintained O2 sats 90-94 on RA with activity today and HR 110-122. Pt requried seated rest between ambulation trials due to leg cramps which are here biggest limiting factor for mobility at this time. Will continue to folllow.     Follow Up Recommendations       Barriers to Discharge        Equipment Recommendations       Recommendations for Other Services    Frequency     Plan Discharge plan remains appropriate;Frequency remains appropriate    Precautions / Restrictions Precautions Precautions: Fall   Pertinent Vitals/Pain 4/10 leg pain cramping on RLE    Mobility  Bed Mobility Bed Mobility: Not assessed Transfers Transfers: Sit to Stand;Stand to Sit Sit to Stand: 6: Modified independent (Device/Increase time);From chair/3-in-1;From toilet Stand to Sit: To toilet;To chair/3-in-1;5: Supervision Details for Transfer Assistance: cueing for hand placement on rail at toilet to sit safely Ambulation/Gait Ambulation/Gait Assistance: 5: Supervision Ambulation Distance (Feet): 140 Feet (x 2 trials with seated rest between) Assistive device: Rolling walker Ambulation/Gait Assistance Details: cueing for posture. One seated rest between ambulation trials Gait Pattern: Step-through pattern;Decreased stride length;Trunk flexed Gait velocity: decreased Stairs: Yes Stairs Assistance: 5: Supervision Stair Management Technique: One rail Right;Step to pattern Number of Stairs: 2     Exercises General Exercises - Lower Extremity Long Arc Quad: AROM;Both;15 reps;Seated Hip Flexion/Marching: AROM;Both;15 reps;Seated   PT Diagnosis:    PT Problem List:   PT Treatment Interventions:      PT Goals Acute Rehab PT Goals PT Goal: Sit to Stand - Progress: Met PT Goal: Ambulate - Progress: Progressing toward goal PT Goal: Up/Down Stairs - Progress: Met  Visit Information  Last PT Received On: 08/12/12 Assistance Needed: +1    Subjective Data  Subjective: I'm still having those cramps in my RLE   Cognition  Overall Cognitive Status: Appears within functional limits for tasks assessed/performed Arousal/Alertness: Awake/alert Orientation Level: Appears intact for tasks assessed Behavior During Session: [facility] for tasks performed    Balance     End of Session PT - End of Session Activity Tolerance: Patient tolerated treatment well Patient left: in chair;with call bell/phone within reach Nurse Communication: Mobility status   GP     [name] 08/12/2012, 9:18 AM [name], PT [phone]

## 2012-08-13 LAB — GLUCOSE, CAPILLARY
Glucose-Capillary: 113 mg/dL — ABNORMAL HIGH (ref 70–99)
Glucose-Capillary: 140 mg/dL — ABNORMAL HIGH (ref 70–99)
Glucose-Capillary: 94 mg/dL (ref 70–99)
Glucose-Capillary: 96 mg/dL (ref 70–99)

## 2012-08-13 LAB — PROTIME-INR
INR: 2.22 — ABNORMAL HIGH (ref 0.00–1.49)
Prothrombin Time: 25 s — ABNORMAL HIGH (ref 11.6–15.2)

## 2012-08-13 MED ORDER — WARFARIN SODIUM 5 MG PO TABS
5.0000 mg | ORAL_TABLET | ORAL | Status: DC
  Filled 2012-08-13: qty 1

## 2012-08-13 MED ORDER — INSULIN DETEMIR 100 UNIT/ML ~~LOC~~ SOLN: 20.0000 [IU] | Freq: Every day | SUBCUTANEOUS | Status: DC

## 2012-08-13 MED ORDER — WARFARIN SODIUM 2.5 MG PO TABS: 2.5000 mg | ORAL_TABLET | ORAL | Status: DC

## 2012-08-13 MED ORDER — INSULIN ASPART 100 UNIT/ML ~~LOC~~ SOLN: 2.0000 [IU] | Freq: Three times a day (TID) | SUBCUTANEOUS | Status: DC

## 2012-08-13 MED ORDER — METHOCARBAMOL 500 MG PO TABS: 500.0000 mg | ORAL_TABLET | Freq: Four times a day (QID) | ORAL | Status: AC | PRN

## 2012-08-13 MED ORDER — LIVING WELL WITH DIABETES BOOK
Freq: Once | Status: AC
  Administered 2012-08-13: 13:00:00
  Filled 2012-08-13: qty 1

## 2012-08-13 MED ORDER — LEVOFLOXACIN 500 MG PO TABS: 500.0000 mg | ORAL_TABLET | Freq: Every day | ORAL | Status: AC

## 2012-08-13 NOTE — Progress Notes (Signed)
[name] to be D/C'd Home per MD order.  Discussed with the patient and all questions fully answered.   [place], [place]  Home Medication Instructions [id number]   Printed on:08/13/12 1412  Medication Information                    atorvastatin (LIPITOR) 20 MG tablet Take 20 mg by mouth at bedtime.            levothyroxine (SYNTHROID, LEVOTHROID) 100 MCG tablet Take 100 mcg by mouth every morning.            ergocalciferol (VITAMIN D2) 50000 UNITS capsule Take 50,000 Units by mouth once a week. Monday           diltiazem (CARDIZEM CD) 360 MG 24 hr capsule Take 360 mg by mouth daily.           metoprolol succinate (TOPROL-XL) 25 MG 24 hr tablet Take 25 mg by mouth daily.           warfarin (COUMADIN) 5 MG tablet Take 2.5-5 mg by mouth daily. Takes 5mg  daily except 2.5mg  on Fridays           insulin aspart (NOVOLOG) 100 UNIT/ML injection Inject 2-12 Units into the skin 3 (three) times daily before meals. According to sliding scale and carbohydrate counting           insulin detemir (LEVEMIR) 100 UNIT/ML injection Inject 20 Units into the skin daily.           methocarbamol (ROBAXIN) 500 MG tablet Take 1 tablet (500 mg total) by mouth every 6 (six) hours as needed (spasms).           levofloxacin (LEVAQUIN) 500 MG tablet Take 1 tablet (500 mg total) by mouth daily.             VVS, Skin clean, dry and intact without evidence of skin break down, no evidence of skin tears noted. IV catheter discontinued intact. Site without signs and symptoms of complications. Dressing and pressure applied.  An After Visit Summary was printed and given to the patient. Patient escorted via WC, and D/C home via private auto.  [name] 08/13/2012 2:12 PM

## 2012-08-13 NOTE — Discharge Summary (Signed)
Physician Discharge Summary  [name] WUJ:[id number] DOB: January 07, 1926 DOA: 08/09/2012  PCP: [name], MD  Admit date: 08/09/2012 Discharge date: 08/13/2012    Discharge Diagnoses:  Principal Problem:  *Acute respiratory failure with hypoxia Active Problems:  Vulvar cancer, carcinoma  Atrial fibrillation with RVR  Diabetes mellitus  Hypertension  Hyperlipidemia  Obesity (BMI 30-39.9)  Hypothyroidism  CVA (cerebral infarction)  Community acquired pneumonia  DM type 2 causing CKD stage 3  Anemia  Thrombocytopenia   Discharge Condition: good  Diet recommendation: low salt  Filed Weights   08/09/12 2227  Weight: 56.1 kg (123 lb 10.9 oz)    History of present illness:  The patient is an 78 year old female with history of vulvar cancer, diabetes, HTN, HL, hypothyroidism, recurrent CVA now with left facial droop and mild balance problems who presents with a two month history of shortness of breath.    Hospital Course:  1. Right lower lobe pneumonia-location raises suspicion for aspiration. After reviewing CT scan of the chest there are clear changes of chronic bronchitis. We treated with levaquin and the patient improved. No overt aspiration was noted.  2. Hypoxic respiratory failure-I am unsure if pneumonia is the only etiology. proBNP is elevated. VQ scan is very low prob. We continued supplemental oxygen and added IV diuretic daily on 8/17. After diuresis the patient improved markedly.  3. Diabetes mellitus - according to patient she had recent hypoglycemia. She continued to have hypoglycemia in house - will continue to decrease the dose of levemir and novolog  4. Recent surgery for vulvar cancer - patient reports that the healing has been completed and that she does not have any further voiding problems 5. Chronic kidney disease stage III suspect due to diabetic nephropathy. Last normal creatinine the computer is from 2009 6. Recent stroke - she has recuperated quite well.  Continue Coumadin for secondary prophylaxis 7. Hypothyroidism-continue home dose Synthroid 8. Anemia and thrombocytopenia - no clear etiology. Will check a vitamin B 12 level 9. Chronic atrial fibrillation-rate controlled on beta blocker and calcium channel blocker. Patient is on chronic Coumadin    Procedures:  VQ scan  CT chest without contrast  Consultations:  none  Discharge Exam: Filed Vitals:   08/13/12 0429  BP: 138/96  Pulse: 113  Temp: 98 F (36.7 C)  Resp: 20   Filed Vitals:   08/12/12 1758 08/12/12 2000 08/12/12 2200 08/13/12 0429  BP: 120/74 114/83 136/85 138/96  Pulse:  101 72 113  Temp:   98.6 F (37 C) 98 F (36.7 C)  TempSrc:   Oral Oral  Resp:   20 20  Height:      Weight:      SpO2:   99% 94%    General: axox3  Cardiovascular: RRR Respiratory: CTAB  Discharge Instructions  Discharge Orders    Future Orders Please Complete By Expires   Diet - low sodium heart healthy      Increase activity slowly        Medication List  As of 08/13/2012 12:17 PM   STOP taking these medications         Tamsulosin HCl 0.4 MG Caps         TAKE these medications         atorvastatin 20 MG tablet   Commonly known as: LIPITOR   Take 20 mg by mouth at bedtime.      diltiazem 360 MG 24 hr capsule   Commonly known as: CARDIZEM CD  Take 360 mg by mouth daily.      ergocalciferol 50000 UNITS capsule   Commonly known as: VITAMIN D2   Take 50,000 Units by mouth once a week. Monday      insulin aspart 100 UNIT/ML injection   Commonly known as: novoLOG   Inject 2-12 Units into the skin 3 (three) times daily before meals. According to sliding scale and carbohydrate counting      insulin detemir 100 UNIT/ML injection   Commonly known as: LEVEMIR   Inject 20 Units into the skin daily.      levofloxacin 500 MG tablet   Commonly known as: LEVAQUIN   Take 1 tablet (500 mg total) by mouth daily.      levothyroxine 100 MCG tablet   Commonly known as:  SYNTHROID, LEVOTHROID   Take 100 mcg by mouth every morning.      methocarbamol 500 MG tablet   Commonly known as: ROBAXIN   Take 1 tablet (500 mg total) by mouth every 6 (six) hours as needed (spasms).      metoprolol succinate 25 MG 24 hr tablet   Commonly known as: TOPROL-XL   Take 25 mg by mouth daily.      warfarin 5 MG tablet   Commonly known as: COUMADIN   Take 2.5-5 mg by mouth daily. Takes 5mg  daily except 2.5mg  on Fridays           Follow-up Information    Follow up with [facility], [place] on 08/12/2012. (Follow up for recheck of your renal function while taking antibiotic)    Contact information:   [address] [facility] [place] [address] [phone]           The results of significant diagnostics from this hospitalization (including imaging, microbiology, ancillary and laboratory) are listed below for reference.    Significant Diagnostic Studies: Dg Chest 2 View  08/09/2012  *RADIOLOGY REPORT*  Clinical Data: Shortness of breath.  CHEST - 2 VIEW  Comparison: 04/23/2012  Findings: Lungs are hypoinflated demonstrate a patchy air space process of the central right lung and perihilar region.  There is no effusion.  The cardiomediastinal silhouette and remainder of the exam is unchanged.  IMPRESSION: Patchy air space process of the right perihilar region and central right lung suggesting infection.  Original Report Authenticated By: Elba Barman, M.D.   Ct Chest Wo Contrast  08/10/2012  *RADIOLOGY REPORT*  Clinical Data: Progressive shortness of breath, wheezing, and cough.  CT CHEST WITHOUT CONTRAST  Technique:  Multidetector CT imaging of the chest was performed following the standard protocol without IV contrast.  Comparison: Chest, 08/09/2012  Findings: Calcification of the thoracic aorta without aneurysm. Mild cardiac enlargement.  Calcification in the coronary arteries and mitral valve annulus.  Prominent mediastinal lymph  nodes, largest in the pretracheal area measuring about 2 cm diameter. This lymphadenopathy is nonspecific and could be reactive although metastatic disease is not excluded.  There are small bilateral pleural effusions.  There is patchy air space disease throughout both lungs more prominent centrally and with a nodular distribution pattern.  This is likely to represent edema or pneumonia.  However, underlying neoplasm is not excluded and follow up after resolution of acute process is recommended.  Airways appear patent.  No pneumothorax.  Slight interstitial fibrosis.  Small esophageal hiatal hernia.  Esophagus is mostly decompressed.  Visualized portions of the upper abdominal organs are grossly unremarkable except for vascular calcifications.  Degenerative changes in the  thoracic spine.  IMPRESSION: Patchy nodular perihilar air space disease bilaterally suggesting infection or edema.  Prominent lymph nodes measuring up to 2 cm are indeterminate.  Recommend follow-up study after resolution of acute symptoms to exclude underlying neoplastic process.  Small bilateral pleural effusions with basilar atelectasis.  Vascular calcifications.  Original Report Authenticated By: Marlon Pel, M.D.   Nm Pulmonary Perfusion  08/09/2012  *RADIOLOGY REPORT*  Clinical Data:  Short of breath  NUCLEAR MEDICINE VENTILATION - PERFUSION LUNG SCAN  Technique:    Perfusion images were obtained in multiple projections after intravenous injection of Tc-39m MAA.  Radiopharmaceuticals: 3.0 mCi Tc-43m MAA.  Comparison: Chest radiograph 08/06 1013  Findings:  Perfusion: No wedge shaped peripheral perfusion defects to suggest acute pulmonary embolism.  There is small defect at the lung base with on the right which may relate to obstructive disease or air space disease seen on comparison radiograph.   IMPRESSION:  Very low probability for acute pulmonary embolism.  Chest radiograph suggests pneumonia.  Original Report Authenticated By:  Genevive Bi, M.D.    Microbiology: Recent Results (from the past 240 hour(s))  CULTURE, BLOOD (ROUTINE X 2)     Status: Normal (Preliminary result)   Collection Time   08/09/12 10:40 PM      Component Value Range Status Comment   Specimen Description BLOOD LEFT ARM   Final    Special Requests BOTTLES DRAWN AEROBIC AND ANAEROBIC 10CC   Final    Culture  Setup Time 08/10/2012 05:10   Final    Culture     Final    Value:        BLOOD CULTURE RECEIVED NO GROWTH TO DATE CULTURE WILL BE HELD FOR 5 DAYS BEFORE ISSUING A FINAL NEGATIVE REPORT   Report Status PENDING   Incomplete   CULTURE, BLOOD (ROUTINE X 2)     Status: Normal (Preliminary result)   Collection Time   08/09/12 10:50 PM      Component Value Range Status Comment   Specimen Description BLOOD RIGHT ARM   Final    Special Requests BOTTLES DRAWN AEROBIC AND ANAEROBIC 10CC   Final    Culture  Setup Time 08/10/2012 05:11   Final    Culture     Final    Value:        BLOOD CULTURE RECEIVED NO GROWTH TO DATE CULTURE WILL BE HELD FOR 5 DAYS BEFORE ISSUING A FINAL NEGATIVE REPORT   Report Status PENDING   Incomplete      Labs: Basic Metabolic Panel:  Lab 08/12/12 8295 08/11/12 1106 08/10/12 0556 08/09/12 2049  NA 142 141 138 135  K 4.0 3.9 4.0 4.3  CL 99 103 102 98  CO2 32 26 24 23   GLUCOSE 160* 183* 132* 332*  BUN 29* 28* 41* 43*  CREATININE 1.69* 1.38* 1.79* 1.96*  CALCIUM 9.9 9.5 9.2 8.8  MG -- -- -- --  PHOS -- -- -- --   Liver Function Tests: No results found for this basename: AST:5,ALT:5,ALKPHOS:5,BILITOT:5,PROT:5,ALBUMIN:5 in the last 168 hours No results found for this basename: LIPASE:5,AMYLASE:5 in the last 168 hours No results found for this basename: AMMONIA:5 in the last 168 hours CBC:  Lab 08/11/12 1106 08/10/12 0556 08/09/12 2049  WBC 7.2 8.0 12.0*  NEUTROABS -- -- 10.5*  HGB 10.5* 9.7* 10.0*  HCT 33.5* 30.6* 32.0*  MCV 94.1 95.0 95.2  PLT 149* 126* 137*   Cardiac Enzymes: No results found for  this basename: CKTOTAL:5,CKMB:5,CKMBINDEX:5,TROPONINI:5 in  the last 168 hours BNP: BNP (last 3 results)  Basename 08/10/12 0824 09/20/11 1342  PROBNP 2235.0* 2866.0*   CBG:  Lab 08/13/12 1128 08/13/12 0739 08/13/12 0047 08/13/12 0004 08/12/12 2206  GLUCAP 140* 96 113* 94 117*    Time coordinating discharge: 45 minutes  Signed:  Christan Defranco  [facility] 08/13/2012, 12:17 PM

## 2012-08-13 NOTE — Progress Notes (Signed)
Physical Therapy Treatment Patient Details Name: [name] MRN: [id number] DOB: 02/04/1926 Today's Date: 08/13/2012 Time: 1191-4782 PT Time Calculation (min): 12 min  PT Assessment / Plan / Recommendation Comments on Treatment Session  Pt with CAP with limited mobility today due to leg cramps. Pt able to perform pericare and linen change with setup and assisted with bed linen change due to incontinence. Pt encouraged to ambulate with nursing this afternoon as able to prepare for return home. Will continue to follow.     Follow Up Recommendations       Barriers to Discharge        Equipment Recommendations       Recommendations for Other Services    Frequency     Plan Discharge plan remains appropriate;Frequency remains appropriate    Precautions / Restrictions Precautions Precautions: Fall   Pertinent Vitals/Pain 5/10 leg cramping RLE    Mobility  Bed Mobility Bed Mobility: Supine to Sit;Sit to Supine Supine to Sit: 6: Modified independent (Device/Increase time);With rails;HOB flat Sit to Supine: 6: Modified independent (Device/Increase time);With rail;HOB flat Transfers Sit to Stand: 5: Supervision;From bed;From toilet Stand to Sit: 5: Supervision;To toilet;To bed Details for Transfer Assistance: cueing for hand placement to sit and stand at toilet but no cueing needed at bed Ambulation/Gait Ambulation/Gait Assistance: 5: Supervision Ambulation Distance (Feet): 15 Feet (x 2 trials) Assistive device: Rolling walker Ambulation/Gait Assistance Details: pt ambulated too and from bathroom but denied further ambulation due to leg cramps Gait Pattern: Step-through pattern;Decreased stride length    Exercises     PT Diagnosis:    PT Problem List:   PT Treatment Interventions:     PT Goals Acute Rehab PT Goals PT Goal: Supine/Side to Sit - Progress: Progressing toward goal PT Goal: Sit to Supine/Side - Progress: Progressing toward goal PT Goal: Sit to Stand - Progress:  Progressing toward goal PT Goal: Ambulate - Progress: Progressing toward goal  Visit Information  Last PT Received On: 08/13/12 Assistance Needed: +1    Subjective Data  Subjective: "I just can't walk more today with this leg cramp"   Cognition  Overall Cognitive Status: Appears within functional limits for tasks assessed/performed Arousal/Alertness: Awake/alert Orientation Level: Appears intact for tasks assessed Behavior During Session: [facility] for tasks performed    Balance     End of Session PT - End of Session Activity Tolerance: Patient limited by pain Patient left: in bed;with call bell/phone within reach   GP     [name] 08/13/2012, 10:05 AM [name], PT [phone]

## 2012-08-13 NOTE — Progress Notes (Signed)
SATURATION QUALIFICATIONS:  Patient Saturations on Room Air at Rest = 95%  Patient Saturations on Room Air while Ambulating = 88%  Patient Saturations on 2 Liters of oxygen while Ambulating = 95%  Statement of medical necessity for home oxygen:

## 2012-08-13 NOTE — Progress Notes (Signed)
ANTICOAGULATION CONSULT NOTE - Follow Up Consult  Pharmacy Consult for warfarin Indication: hx of  stroke and Afib  Labs:  [facility] 08/13/12 0609 08/12/12 0830 08/11/12 1106 08/11/12 0617  HGB -- -- 10.5* --  HCT -- -- 33.5* --  PLT -- -- 149* --  APTT -- -- -- --  LABPROT 25.0* 27.1* -- 22.8*  INR 2.22* 2.46* -- 1.97*  HEPARINUNFRC -- -- -- --  CREATININE -- 1.69* 1.38* --  CKTOTAL -- -- -- --  CKMB -- -- -- --  TROPONINI -- -- -- --   Medications:  Prescriptions prior to admission  Medication Sig Dispense Refill  . atorvastatin (LIPITOR) 20 MG tablet Take 20 mg by mouth at bedtime.       [name] diltiazem (CARDIZEM CD) 360 MG 24 hr capsule Take 360 mg by mouth daily.      . ergocalciferol (VITAMIN D2) 50000 UNITS capsule Take 50,000 Units by mouth once a week. Monday      . insulin aspart (NOVOLOG) 100 UNIT/ML injection Inject 16-26 Units into the skin 3 (three) times daily before meals. 16 units in the am, 16 at lunch, and 26 at supper      . insulin detemir (LEVEMIR) 100 UNIT/ML injection Inject 16-44 Units into the skin 2 (two) times daily. 44 units at breakfast, 16 in the evening      . levothyroxine (SYNTHROID, LEVOTHROID) 100 MCG tablet Take 100 mcg by mouth every morning.       . metoprolol succinate (TOPROL-XL) 25 MG 24 hr tablet Take 25 mg by mouth daily.      . Tamsulosin HCl (FLOMAX) 0.4 MG CAPS Take 0.4 mg by mouth daily after supper.      . warfarin (COUMADIN) 5 MG tablet Take 2.5-5 mg by mouth daily. Takes 5mg  daily except 2.5mg  on Fridays       Assessment: Patient is an 77 y.o F on Coumadin PTA (5mg  daily except 2.5mg  on Fri.)for hx CVA. She c/o SOB and was sent here by her cardiologist for r/o PE. V/Q scan with "very low probability" for PE, but CXR suspicious for PNA.   Anticoag: INR is 2.22 in goal range.  No new CBC today but no noted s/s of bleeding complications with anticoagulation.  Medication Review: 1.  Levofloxacin + Warfarin - Some quinolone antibiotics  have been reported to potentiate the hypoprothrombinemic effect of warfarin and other coumarin anticoagulants. The exact mechanism is unknown but may involve inhibition of coumarin metabolism and/or depletion of certain clotting factors due to suppression of vitamin K-producing intestinal flora.   Hassell Done, [place] "Levofloxacin and warfarin interaction." Ann Pharmacother 36 (2002): 1554-7   Infectious Disease: Levaquin IV (adjusted for renal fxn)for PNA. Afeb, WBC 7.2. BC x2 pend.  Cardiovascular: HTN, HLP, afib -138/96,113. Meds: Lipitor, diltiazem, metoprolol  Endocrinology: DM, hypothyroidism. Levothyroxine, CBGs 58-117 on SSI + levemir  Gastrointestinal / Nutrition: ok  Neurology: hx CVA  Nephrology: Clearance low ~ 21 ml/min but good UOP (1.52ml/kg). Creat. = 1.69  Pulmonary: COPD exacerbation - CAP - on Levo.  Hematology / Oncology: hx vulvar cancer   PTA Medication Issues: - Lower Dilt dose in house than home - f/u for need to decrease/increase.  Best Practices: warfarin  Goal of Therapy:  INR 2-3 Monitor platelets by anticoagulation protocol: Yes   Plan:   Will continue home regimen for now but may need to decrease if INR continues upward trend.  Monitory closely for s/s of bleeding  F/u  length of therapy with IV Levofloxacin and potential drug/drug interaction.    Amijah Timothy S. Merilynn Finland, PharmD, [facility] Clinical Staff Pharmacist Pager [phone]  08/13/2012 8:38 AM

## 2012-08-13 NOTE — Care Management Note (Signed)
    Page 1 of 2   08/13/2012     3:20:43 PM   CARE MANAGEMENT NOTE 08/13/2012  Patient:  [name], [name]   Account Number:  [id number]  Date Initiated:  08/13/2012  Documentation initiated by:  Letha Cape  Subjective/Objective Assessment:   dx acute resp failure, hypoxia  admit- lives with spouse.  Pt is active with [facility] for [facility] /PT     Action/Plan:   pt eval- rec hhpt   Anticipated DC Date:  08/13/2012   Anticipated DC Plan:  HOME W HOME HEALTH SERVICES      DC Planning Services  CM consult      [facility] Choice  HOME HEALTH  Resumption Of Svcs/PTA Provider   Choice offered to / List presented to:  C-1 Patient   DME arranged  [facility]      DME agency  [facility]     HH arranged  HH-1 RN  HH-2 PT      [facility] agency  [facility] Services   Status of service:  Completed, signed off Medicare Important Message given?   (If response is "NO", the following Medicare IM given date fields will be blank) Date Medicare IM given:   Date Additional Medicare IM given:    Discharge Disposition:  HOME W HOME HEALTH SERVICES  Per UR Regulation:  Reviewed for med. necessity/level of care/duration of stay  If discussed at Long Length of Stay Meetings, dates discussed:    Comments:  08/13/12 15:18 Letha Cape RN, BSN [phone] patient lives with spouse, pt for dc today, active with [place] and would like to continue with [place] for HHRN/PT.  Referral made to Rich Fuchs notified.  Also notified Justin with [facility] for oxygen.  Soc will begin 24-48 hrs post discharge.   Patient has medication coverage and transportation.

## 2012-08-16 LAB — CULTURE, BLOOD (ROUTINE X 2)

## 2012-11-08 DIAGNOSIS — H18899 Other specified disorders of cornea, unspecified eye: Secondary | ICD-10-CM

## 2012-11-08 HISTORY — DX: Other specified disorders of cornea, unspecified eye: H18.899

## 2012-11-22 ENCOUNTER — Emergency Department (HOSPITAL_COMMUNITY): Payer: Medicare Other

## 2012-11-22 ENCOUNTER — Encounter (HOSPITAL_COMMUNITY): Payer: Self-pay | Admitting: *Deleted

## 2012-11-22 ENCOUNTER — Emergency Department (HOSPITAL_COMMUNITY)
Admission: EM | Admit: 2012-11-22 | Discharge: 2012-11-22 | Disposition: A | Discharge status: Home / Self Care | Payer: Medicare Other | Attending: Emergency Medicine | Admitting: Emergency Medicine

## 2012-11-22 DIAGNOSIS — Z7901 Long term (current) use of anticoagulants: Secondary | ICD-10-CM | POA: Insufficient documentation

## 2012-11-22 DIAGNOSIS — S52599A Other fractures of lower end of unspecified radius, initial encounter for closed fracture: Secondary | ICD-10-CM | POA: Insufficient documentation

## 2012-11-22 DIAGNOSIS — Z8673 Personal history of transient ischemic attack (TIA), and cerebral infarction without residual deficits: Secondary | ICD-10-CM | POA: Insufficient documentation

## 2012-11-22 DIAGNOSIS — W1809XA Striking against other object with subsequent fall, initial encounter: Secondary | ICD-10-CM | POA: Insufficient documentation

## 2012-11-22 DIAGNOSIS — Y929 Unspecified place or not applicable: Secondary | ICD-10-CM | POA: Insufficient documentation

## 2012-11-22 DIAGNOSIS — Z8544 Personal history of malignant neoplasm of other female genital organs: Secondary | ICD-10-CM | POA: Insufficient documentation

## 2012-11-22 DIAGNOSIS — Y9389 Activity, other specified: Secondary | ICD-10-CM | POA: Insufficient documentation

## 2012-11-22 DIAGNOSIS — E119 Type 2 diabetes mellitus without complications: Secondary | ICD-10-CM | POA: Insufficient documentation

## 2012-11-22 DIAGNOSIS — E669 Obesity, unspecified: Secondary | ICD-10-CM | POA: Insufficient documentation

## 2012-11-22 DIAGNOSIS — I10 Essential (primary) hypertension: Secondary | ICD-10-CM | POA: Insufficient documentation

## 2012-11-22 DIAGNOSIS — Z87891 Personal history of nicotine dependence: Secondary | ICD-10-CM | POA: Insufficient documentation

## 2012-11-22 DIAGNOSIS — E039 Hypothyroidism, unspecified: Secondary | ICD-10-CM | POA: Insufficient documentation

## 2012-11-22 DIAGNOSIS — I4891 Unspecified atrial fibrillation: Secondary | ICD-10-CM | POA: Insufficient documentation

## 2012-11-22 DIAGNOSIS — S52509A Unspecified fracture of the lower end of unspecified radius, initial encounter for closed fracture: Secondary | ICD-10-CM

## 2012-11-22 DIAGNOSIS — Z794 Long term (current) use of insulin: Secondary | ICD-10-CM | POA: Insufficient documentation

## 2012-11-22 DIAGNOSIS — Z79899 Other long term (current) drug therapy: Secondary | ICD-10-CM | POA: Insufficient documentation

## 2012-11-22 DIAGNOSIS — E785 Hyperlipidemia, unspecified: Secondary | ICD-10-CM | POA: Insufficient documentation

## 2012-11-22 MED ORDER — BACITRACIN 500 UNIT/GM EX OINT
1.0000 "application " | TOPICAL_OINTMENT | Freq: Two times a day (BID) | CUTANEOUS | Status: DC
  Filled 2012-11-22: qty 0.9

## 2012-11-22 MED ORDER — HYDROCODONE-ACETAMINOPHEN 5-325 MG PO TABS: 1.0000 | ORAL_TABLET | ORAL | Status: DC | PRN

## 2012-11-22 NOTE — ED Provider Notes (Signed)
History     CSN: [id number]  Arrival date & time 11/22/12  1547   First MD Initiated Contact with Patient 11/22/12 1620      Chief Complaint  Patient presents with  . Fall  . Hand Injury    rt    (Consider location/radiation/quality/duration/timing/severity/associated sxs/prior treatment) Patient is a 78 y.o. female presenting with fall and hand injury. The history is provided by the patient.  Fall  Hand Injury    patient here after falling 3 nights ago and striking her right hand and wrist. Patient denies any other injuries. Pain is worse with movement and characterized as sharp. She denies any headache or neck pain. She denies any back pain. She denies numbness or tingling to her right fingers. She did have some abrasions to the top of her hand which were bandaged by her husband and are not bleeding currently.  Past Medical History  Diagnosis Date  . Diabetes mellitus   . Hypertension   . Hyperlipidemia   . Obesity   . Hypothyroidism   . Vulvar cancer, carcinoma 11/03/2011  . Stroke 05/2000    right brain CVA pre H&P 2001  . Atrial fibrillation     Past Surgical History  Procedure Date  . Vulvar lesion removal 2012  . Breast surgery     Lumpectomy  . Dilation and curettage of uterus   . Cystourethroplasty / ureteroneocystostomy   . Radical vulvectomy   . Lymphadenectomy 2007    R inguinal femoral  . Radical vulvectomy 02/27/12  . Vulvar lesion removal 02/27/2012    Procedure: VULVAR LESION;  Surgeon: Jeannette Corpus, MD;  Location: [facility];  Service: Gynecology;  Laterality: N/A;    Family History  Problem Relation Age of Onset  . Diabetes type II    . Coronary artery disease Mother   . Lung cancer Brother   . Diabetes type II Brother     History  Substance Use Topics  . Smoking status: Former Smoker -- 0.2 packs/day for 18 years    Types: Cigarettes    Quit date: 11/03/1967  . Smokeless tobacco: Not on file  . Alcohol Use: No    OB History      Grav Para Term Preterm Abortions TAB SAB Ect Mult Living                  Review of Systems  All other systems reviewed and are negative.    Allergies  Review of patient's allergies indicates no known allergies.  Home Medications   Current Outpatient Rx  Name  Route  Sig  Dispense  Refill  . ATORVASTATIN CALCIUM 20 MG PO TABS   Oral   Take 20 mg by mouth at bedtime.          [name] DILTIAZEM HCL ER COATED BEADS 360 MG PO CP24   Oral   Take 360 mg by mouth daily.         . ERGOCALCIFEROL 50000 UNITS PO CAPS   Oral   Take 50,000 Units by mouth once a week. Monday         . ERYTHROMYCIN 5 MG/GM OP OINT   Right Eye   Place 1 application into the right eye 3 (three) times daily.         . INSULIN ASPART 100 UNIT/ML [place] SOLN   Subcutaneous   Inject 20-25 Units into the skin 3 (three) times daily before meals. Takes 20 units every morning, 20 units daily at  lunch, and 25 units at night         . INSULIN DETEMIR 100 UNIT/ML [place] SOLN   Subcutaneous   Inject 16 Units into the skin at bedtime.         [name] LEVOTHYROXINE SODIUM 100 MCG PO TABS   Oral   Take 100 mcg by mouth every morning.          [name] METOPROLOL SUCCINATE ER 25 MG PO TB24   Oral   Take 25 mg by mouth daily.         [name] PREDNISOLONE ACETATE 1 % OP SUSP   Left Eye   Place 1 drop into the left eye 4 (four) times daily.         . WARFARIN SODIUM 5 MG PO TABS   Oral   Take 2.5-5 mg by mouth daily. Takes 5mg  daily except 2.5mg  on Fridays           BP 144/97  Pulse 106  Temp 97.9 F (36.6 C) (Oral)  Resp 18  SpO2 98%  Physical Exam  Nursing note and vitals reviewed. Constitutional: She is oriented to person, place, and time. She appears well-developed and well-nourished.  Non-toxic appearance. No distress.  HENT:  Head: Normocephalic and atraumatic.  Eyes: Conjunctivae normal, EOM and lids are normal. Pupils are equal, round, and reactive to light.  Neck: Normal range of motion. Neck  supple. No spinous process tenderness and no muscular tenderness present. No tracheal deviation present. No mass present.  Cardiovascular: Normal rate, regular rhythm and normal heart sounds.  Exam reveals no gallop.   No murmur heard. Pulmonary/Chest: Effort normal and breath sounds normal. No stridor. No respiratory distress. She has no decreased breath sounds. She has no wheezes. She has no rhonchi. She has no rales.  Abdominal: Soft. Normal appearance and bowel sounds are normal. She exhibits no distension. There is no tenderness. There is no rebound and no CVA tenderness.  Musculoskeletal: Normal range of motion. She exhibits no edema and no tenderness.       Dorsum of right hand with multiple abrasions which are noninfected or draining. Some ecchymosis noted. She also has ecchymosis on the volar aspect of the right forearm. Compartment is soft. Radial pulses 2+. Limited range of motion at the wrist  Neurological: She is alert and oriented to person, place, and time. She has normal strength. No cranial nerve deficit or sensory deficit. GCS eye subscore is 4. GCS verbal subscore is 5. GCS motor subscore is 6.  Skin: Skin is warm and dry. No abrasion and no rash noted.  Psychiatric: She has a normal mood and affect. Her speech is normal and behavior is normal.    ED Course  Procedures (including critical care time)  Labs Reviewed - No data to display Dg Hand Complete Right  11/22/2012  *RADIOLOGY REPORT*  Clinical Data: Fall, hand injury  RIGHT HAND - COMPLETE 3+ VIEW  Comparison: None.  Findings: There is a fracture of the distal right radius along the radial border.  Fracture enters articular surface.  Radiocarpal joint appears intact. Ventral angulation of the fracture fragment. No evidence of carpal fracture.  IMPRESSION: Facture of the distal right radial metaphysis along the radial border.  Fracture is intra-articular.   Original Report Authenticated By: Genevive Bi, M.D.      No  diagnosis found.    MDM  Wounds will be dressed with bacitracin and Vaseline gauze. She has no evidence of compartment syndrome at this  time. Splint will be applied the orthopedic tech. Spoke with Dr. Izora Ribas and he will see the patient in followup next week.        Toy Baker, MD 11/22/12 [phone]

## 2012-11-22 NOTE — ED Notes (Signed)
Ortho in with patient.

## 2012-11-22 NOTE — ED Notes (Signed)
Pt fell Tuesday night, rt hand injury, swollen with skin tears

## 2013-07-07 ENCOUNTER — Telehealth: Payer: Self-pay | Admitting: Endocrinology

## 2013-07-07 MED ORDER — GLUCOSE BLOOD VI STRP: ORAL_STRIP | Status: DC

## 2013-07-28 ENCOUNTER — Telehealth: Payer: Self-pay | Admitting: *Deleted

## 2013-07-28 MED ORDER — INSULIN LISPRO 100 UNIT/ML (KWIKPEN): 20.0000 [IU] | PEN_INJECTOR | Freq: Three times a day (TID) | SUBCUTANEOUS | Status: DC

## 2013-07-28 NOTE — Telephone Encounter (Signed)
rx for Humalog has been sent in to patients pharmacy

## 2013-07-28 NOTE — Telephone Encounter (Signed)
Patient received a letter from her insurance company saying they will not pay for Novolog, I explained that they would want her to go to Humalog but she wanted me to check with you first.

## 2013-07-28 NOTE — Telephone Encounter (Signed)
Try Humalog, also has she scheduled follow up appointment?

## 2013-07-29 ENCOUNTER — Other Ambulatory Visit (INDEPENDENT_AMBULATORY_CARE_PROVIDER_SITE_OTHER): Payer: Medicare Other

## 2013-07-29 ENCOUNTER — Encounter: Payer: Self-pay | Admitting: Endocrinology

## 2013-07-29 ENCOUNTER — Other Ambulatory Visit: Payer: Self-pay | Admitting: *Deleted

## 2013-07-29 ENCOUNTER — Ambulatory Visit (INDEPENDENT_AMBULATORY_CARE_PROVIDER_SITE_OTHER): Payer: Medicare Other | Admitting: Endocrinology

## 2013-07-29 ENCOUNTER — Other Ambulatory Visit: Payer: Self-pay | Admitting: Endocrinology

## 2013-07-29 VITALS — BP 110/60 | HR 88 | Resp 14 | Ht 64.0 in | Wt 148.9 lb

## 2013-07-29 DIAGNOSIS — E1129 Type 2 diabetes mellitus with other diabetic kidney complication: Secondary | ICD-10-CM

## 2013-07-29 DIAGNOSIS — E039 Hypothyroidism, unspecified: Secondary | ICD-10-CM

## 2013-07-29 DIAGNOSIS — IMO0001 Reserved for inherently not codable concepts without codable children: Secondary | ICD-10-CM

## 2013-07-29 DIAGNOSIS — E119 Type 2 diabetes mellitus without complications: Secondary | ICD-10-CM

## 2013-07-29 DIAGNOSIS — I4891 Unspecified atrial fibrillation: Secondary | ICD-10-CM

## 2013-07-29 DIAGNOSIS — E1122 Type 2 diabetes mellitus with diabetic chronic kidney disease: Secondary | ICD-10-CM

## 2013-07-29 DIAGNOSIS — N183 Chronic kidney disease, stage 3 unspecified: Secondary | ICD-10-CM

## 2013-07-29 LAB — COMPREHENSIVE METABOLIC PANEL
AST: 20 U/L (ref 0–37)
Albumin: 3.3 g/dL — ABNORMAL LOW (ref 3.5–5.2)
BUN: 24 mg/dL — ABNORMAL HIGH (ref 6–23)
CO2: 28 mEq/L (ref 19–32)
Calcium: 9.3 mg/dL (ref 8.4–10.5)
Chloride: 105 mEq/L (ref 96–112)
Creatinine, Ser: 1.5 mg/dL — ABNORMAL HIGH (ref 0.4–1.2)
GFR: 35.36 mL/min — ABNORMAL LOW (ref >60.00)
Glucose, Bld: 127 mg/dL — ABNORMAL HIGH (ref 70–99)
Potassium: 4.2 mEq/L (ref 3.5–5.1)

## 2013-07-29 LAB — URINALYSIS, ROUTINE W REFLEX MICROSCOPIC
Bilirubin Urine: NEGATIVE
Nitrite: NEGATIVE
Total Protein, Urine: 100
Urine Glucose: NEGATIVE
pH: 6 (ref 5.0–8.0)

## 2013-07-29 LAB — MICROALBUMIN / CREATININE URINE RATIO: Microalb, Ur: 32.1 mg/dL — ABNORMAL HIGH (ref 0.0–1.9)

## 2013-07-29 LAB — TSH: TSH: 1.5 u[IU]/mL (ref 0.35–5.50)

## 2013-07-29 NOTE — Patient Instructions (Addendum)
LEVEMIR  24 IN AND and 16 in pm and keep same Humalog

## 2013-07-29 NOTE — Progress Notes (Signed)
Patient ID: ISLAND DOHMEN, female   DOB: 16-Jun-1926, 79 y.o.   MRN: [id number]  [name] is an 79 y.o. female.   Reason for Appointment: Diabetes follow-up   History of Present Illness   Diagnosis: Type 2 DIABETES MELITUS, long-standing     Insulin regimen: Levemir 14 in the morning and 20 in the evening, Humalog 16-22-28           Proper timing of medications in relation to meals: Yes.         Monitors blood glucose:  3 times a day.    Glucometer: One Touch.          Blood Glucose readings from meter download: readings before breakfast:  Median 184 with recent range 112-284, midday medium 338, range 184-103, 6 PM 93, for 3 and late evening recently 165-419 Overall median 210 Hypoglycemia frequency:  she had one low reading at midnight of 53 last month.          Meals: 3 meals per day.          Physical activity:  minimal           Complications: are: Nephropathy     The patient's diabetes control appears to be worse now with significantly higher readings during the day. This is likely to be from her not following instructions for the doses of the morning Levemir insulin and is taking only 14 units, was previously taking well over 20 units She thinks she is compliant with her mealtime insulin although she has had difficulty with insurance coverage of NovoLog and is switching to Humalog She is compliant with her evening Levemir and fasting readings are relatively better  Wt Readings from Last 3 Encounters:  07/29/13 148 lb 14.4 oz (67.541 kg)  08/09/12 123 lb 10.9 oz (56.1 kg)  05/10/12 125 lb 10.6 oz (57 kg)      Medication List       This list is accurate as of: 07/29/13 11:59 PM.  Always use your most recent med list.               atorvastatin 20 MG tablet  Commonly known as:  LIPITOR  Take 20 mg by mouth at bedtime.     diltiazem 360 MG 24 hr capsule  Commonly known as:  CARDIZEM CD  Take 360 mg by mouth daily.     erythromycin ophthalmic ointment  Place 1  application into the right eye 3 (three) times daily.     furosemide 40 MG tablet  Commonly known as:  LASIX  40 mg.     glucose blood test strip  Commonly known as:  ONE TOUCH TEST STRIPS  Use as instructed     insulin detemir 100 UNIT/ML injection  Commonly known as:  LEVEMIR  Inject 14 Units into the skin at bedtime.     insulin lispro 100 UNIT/ML Sopn  Commonly known as:  HUMALOG  Inject 16-28 Units into the skin 3 (three) times daily with meals. 16 units at breakfast, 22 units at breakfast and 28 units at supper     KLOR-CON M10 10 MEQ tablet  Generic drug:  potassium chloride  10 mEq.     levothyroxine 100 MCG tablet  Commonly known as:  SYNTHROID, LEVOTHROID  Take 100 mcg by mouth every morning.     metoprolol succinate 25 MG 24 hr tablet  Commonly known as:  TOPROL-XL  Take 25 mg by mouth 3 (three) times daily.  warfarin 5 MG tablet  Commonly known as:  COUMADIN  Take 2.5-5 mg by mouth daily. Takes 5mg  daily except 2.5mg  on Fridays        Allergies: No Known Allergies  Past Medical History  Diagnosis Date  . Diabetes mellitus   . Hypertension   . Hyperlipidemia   . Obesity   . Hypothyroidism   . Vulvar cancer, carcinoma 11/03/2011  . Stroke 05/2000    right brain CVA pre H&P 2001  . Atrial fibrillation     Past Surgical History  Procedure Laterality Date  . Vulvar lesion removal  2012  . Breast surgery      Lumpectomy  . Dilation and curettage of uterus    . Cystourethroplasty / ureteroneocystostomy    . Radical vulvectomy    . Lymphadenectomy  2007    R inguinal femoral  . Radical vulvectomy  02/27/12  . Vulvar lesion removal  02/27/2012    Procedure: VULVAR LESION;  Surgeon: Jeannette Corpus, MD;  Location: [facility];  Service: Gynecology;  Laterality: N/A;    Family History  Problem Relation Age of Onset  . Diabetes type II    . Coronary artery disease Mother   . Lung cancer Brother   . Diabetes type II Brother     Social  History:  reports that she quit smoking about 45 years ago. Her smoking use included Cigarettes. She has a 4.5 pack-year smoking history. She does not have any smokeless tobacco history on file. She reports that she does not drink alcohol or use illicit drugs.  Review of Systems:  HYPERTENSION:  her blood pressure has been relatively easy to control in the last few months She thinks her cardiologist has increased her diltiazem and metoprolol She has a history of atrial fibrillation, taking Coumadin She complains of feeling excessively fatigued, no increase in shortness of breath Recent pedal edema   HYPERLIPIDEMIA: The lipid abnormality consists of elevated LDL.  She has had persistent hoarseness  She is still having problems with her eye and is having some scarring of her eyelid  She has a long-standing history of hypothyroidism and this is usually well controlled     Examination:   BP 110/60  Pulse 88  Resp 14  Ht 5\' 4"  (1.626 m)  Wt 148 lb 14.4 oz (67.541 kg)  BMI 25.55 kg/m2  SpO2 95%  Body mass index is 25.55 kg/(m^2).   Heart rate is regular and about 84-88 No abnormal heart sounds Lungs clear  No pedal edema  ASSESSMENT/ PLAN::   Diabetes type 2   The patient's diabetes control appears to be poor now with significantly higher readings during the day. This is likely to be from her not following instructions for her morning Levemir insulin and is taking much less of a dose compared to evening  She is not a candidate for additional drugs like metformin, Actos or Invokana because of her renal insufficiency and history of congestive heart failure We'll try to get her blood sugars better controlled in the day by increasing her morning level medicine but also reduce her evening dose by 4 units to avoid overnight hypoglycemia  HYPERTENSION: Blood pressure is low normal and would recommend reducing her metoprolol to 50 mg   ATRIAL fibrillation: Her heart rate appears to be  fairly well controlledand she appears to be having more fatigue with 75 mg of metoprolol   HYPOTHYROIDISM: TSH will be checked in followup Chronic kidney disease/nephropathy: Has had mild increase  in creatinine previously, to check again today   [facility] 07/31/2013, 11:27 AM   Addendum: Labs are as follows: A1c is 9.5, creatinine 1.5   Appointment on 07/29/2013  Component Date Value Range Status  . Hemoglobin A1C 07/29/2013 9.5* 4.6 - 6.5 % Final   Glycemic Control Guidelines for People with Diabetes:Non Diabetic:  <6%Goal of Therapy: <7%Additional Action Suggested:  >8%   . Sodium 07/29/2013 140  135 - 145 mEq/L Final  . Potassium 07/29/2013 4.2  3.5 - 5.1 mEq/L Final  . Chloride 07/29/2013 105  96 - 112 mEq/L Final  . CO2 07/29/2013 28  19 - 32 mEq/L Final  . Glucose, Bld 07/29/2013 127* 70 - 99 mg/dL Final  . BUN 40/98/1191 24* 6 - 23 mg/dL Final  . Creatinine, Ser 07/29/2013 1.5* 0.4 - 1.2 mg/dL Final  . Total Bilirubin 07/29/2013 1.1  0.3 - 1.2 mg/dL Final  . Alkaline Phosphatase 07/29/2013 72  39 - 117 U/L Final  . AST 07/29/2013 20  0 - 37 U/L Final  . ALT 07/29/2013 16  0 - 35 U/L Final  . Total Protein 07/29/2013 7.0  6.0 - 8.3 g/dL Final  . Albumin 47/82/9562 3.3* 3.5 - 5.2 g/dL Final  . Calcium 13/07/6577 9.3  8.4 - 10.5 mg/dL Final  . GFR 46/96/2952 35.36* >60.00 mL/min Final  . Free T4 07/29/2013 1.23  0.60 - 1.60 ng/dL Final  . TSH 84/13/2440 1.50  0.35 - 5.50 uIU/mL Final  . Microalb, Ur 07/29/2013 32.1* 0.0 - 1.9 mg/dL Final  . Creatinine,U 10/21/2535 236.4   Final  . Microalb Creat Ratio 07/29/2013 13.6  0.0 - 30.0 mg/g Final  . Color, Urine 07/29/2013 YELLOW  Yellow;Lt. Yellow Final  . APPearance 07/29/2013 CLEAR  Clear Final  . Specific Gravity, Urine 07/29/2013 1.025  1.000-1.030 Final  . pH 07/29/2013 6.0  5.0 - 8.0 Final  . Total Protein, Urine 07/29/2013 100  Negative Final  . Urine Glucose 07/29/2013 NEGATIVE  Negative Final  . Ketones, ur 07/29/2013  TRACE  Negative Final  . Bilirubin Urine 07/29/2013 NEGATIVE  Negative Final  . Hgb urine dipstick 07/29/2013 TRACE-INTACT  Negative Final  . Urobilinogen, UA 07/29/2013 1.0  0.0 - 1.0 Final  . Leukocytes, UA 07/29/2013 SMALL  Negative Final  . Nitrite 07/29/2013 NEGATIVE  Negative Final  . WBC, UA 07/29/2013 7-10/hpf  0-2/hpf Final  . RBC / HPF 07/29/2013 0-2/hpf  0-2/hpf Final  . Mucus, UA 07/29/2013 Presence of  None Final  . Squamous Epithelial / LPF 07/29/2013 Rare(0-4/hpf)  Rare(0-4/hpf) Final

## 2013-08-26 ENCOUNTER — Encounter: Payer: Self-pay | Admitting: Endocrinology

## 2013-08-26 ENCOUNTER — Ambulatory Visit (INDEPENDENT_AMBULATORY_CARE_PROVIDER_SITE_OTHER): Payer: Medicare Other | Admitting: Endocrinology

## 2013-08-26 VITALS — BP 112/76 | HR 105 | Temp 98.2°F | Resp 14 | Ht 64.0 in | Wt 151.4 lb

## 2013-08-26 DIAGNOSIS — E785 Hyperlipidemia, unspecified: Secondary | ICD-10-CM

## 2013-08-26 DIAGNOSIS — E039 Hypothyroidism, unspecified: Secondary | ICD-10-CM

## 2013-08-26 NOTE — Progress Notes (Signed)
Patient ID: [name], female   DOB: January 06, 1926, 79 y.o.   MRN: [id number]  [name] is an 79 y.o. female.   Reason for Appointment: Diabetes follow-up   History of Present Illness   Diagnosis: Type 2 DIABETES MELITUS, long-standing  She has been on insulin for several years and usually is compliant with her basal bolus regimen.   The patient's diabetes control appears to be much better now after adjusting her insulin on the last visit; for some reason was taking only 14 units Levemir on the last time in the morning instead of at least 20 She thinks she is compliant with her mealtime insulin although she has had difficulty with insurance coverage of NovoLog and is now on Humalog She is compliant with her evening Levemir and fasting readings are averaging 130 now including occasional lower right hypoglycemia and her fasting of 64 twice. Also is getting some hypoglycemia in the afternoon and once at bedtime Her A1c last month was 9.5 indicating persistently high readings   Insulin regimen: Levemir 24 in the morning and 20 in the evening, Humalog 16-22-28 ACC           Proper timing of medications in relation to meals: Yes.         Monitors blood glucose:  3 times a day.    Glucometer: One Touch.          Blood Glucose readings from meter download: readings before breakfast:  Median 136 with recent range 64-187, midmorning highest 269, may be median 196 but recently not checked, after the lowest reading 50 and suppertime recently 87-141. Late evening/bedtime range 54-343 with median 198 Overall median 139, previously 210 Hypoglycemia frequency:  s  Has had occasional       Meals: 3 meals per day.          Physical activity:  minimal           Complications: are: Nephropathy    Eye exams: Have been regular  Lab Results  Component Value Date   MICROALBUR 32.1* 07/29/2013     Wt Readings from Last 3 Encounters:  08/26/13 151 lb 6.4 oz (68.675 kg)  07/29/13 148 lb 14.4 oz (67.541 kg)   08/09/12 123 lb 10.9 oz (56.1 kg)      Medication List       This list is accurate as of: 08/26/13 10:28 AM.  Always use your most recent med list.               atorvastatin 20 MG tablet  Commonly known as:  LIPITOR  Take 20 mg by mouth at bedtime.     diltiazem 360 MG 24 hr capsule  Commonly known as:  CARDIZEM CD  Take 360 mg by mouth daily.     erythromycin ophthalmic ointment  Place 1 application into the right eye 3 (three) times daily.     furosemide 40 MG tablet  Commonly known as:  LASIX  40 mg.     glucose blood test strip  Commonly known as:  ONE TOUCH TEST STRIPS  Use as instructed     insulin detemir 100 UNIT/ML injection  Commonly known as:  LEVEMIR  Inject 24 Units into the skin at bedtime. 24 units in am and 20 units in the pm uses flexpen     insulin lispro 100 UNIT/ML Sopn  Commonly known as:  HUMALOG  Inject 16-28 Units into the skin 3 (three) times daily with meals. 16 units  at breakfast, 22 units at breakfast and 28 units at supper     KLOR-CON M10 10 MEQ tablet  Generic drug:  potassium chloride  10 mEq.     levothyroxine 100 MCG tablet  Commonly known as:  SYNTHROID, LEVOTHROID  Take 100 mcg by mouth every morning.     metoprolol succinate 25 MG 24 hr tablet  Commonly known as:  TOPROL-XL  Take 25 mg by mouth 2 (two) times daily.     warfarin 5 MG tablet  Commonly known as:  COUMADIN  Take 2.5-5 mg by mouth daily. Takes 5mg  daily except 2.5mg  on Monday, Wednesdays andFridays        Allergies: No Known Allergies  Past Medical History  Diagnosis Date  . Diabetes mellitus   . Hypertension   . Hyperlipidemia   . Obesity   . Hypothyroidism   . Vulvar cancer, carcinoma 11/03/2011  . Stroke 05/2000    right brain CVA pre H&P 2001  . Atrial fibrillation     Past Surgical History  Procedure Laterality Date  . Vulvar lesion removal  2012  . Breast surgery      Lumpectomy  . Dilation and curettage of uterus    .  Cystourethroplasty / ureteroneocystostomy    . Radical vulvectomy    . Lymphadenectomy  2007    R inguinal femoral  . Radical vulvectomy  02/27/12  . Vulvar lesion removal  02/27/2012    Procedure: VULVAR LESION;  Surgeon: Jeannette Corpus, MD;  Location: [facility];  Service: Gynecology;  Laterality: N/A;    Family History  Problem Relation Age of Onset  . Diabetes type II    . Coronary artery disease Mother   . Lung cancer Brother   . Diabetes type II Brother     Social History:  reports that she quit smoking about 45 years ago. Her smoking use included Cigarettes. She has a 4.5 pack-year smoking history. She does not have any smokeless tobacco history on file. She reports that she does not drink alcohol or use illicit drugs.  Review of Systems:  HYPERTENSION:  her blood pressure has been relatively easy to control in the last few months with diltiazem and metoprolol She has a history of atrial fibrillation, taking Coumadin She complains of feeling excessively fatigued, no increase in shortness of breath  HYPERLIPIDEMIA: The lipid abnormality consists of elevated LDL.  She has had persistent hoarseness  She is still having problems with her eye and is having some scarring of her eyelid  She has a long-standing history of hypothyroidism and recent TSH was 1.5    Chronic kidney disease/nephropathy: Has had mild increase in creatinine  of 1.5, history of proteinuria   Examination:   BP 112/76  Pulse 105  Temp(Src) 98.2 F (36.8 C)  Resp 14  Ht 5\' 4"  (1.626 m)  Wt 151 lb 6.4 oz (68.675 kg)  BMI 25.98 kg/m2  SpO2 94%  Body mass index is 25.98 kg/(m^2).   Cardiac:Heart rate is irregular and about 88-92 No abnormal heart sounds No pedal edema  ASSESSMENT/ PLAN::   Diabetes type 2   The patient's diabetes control appears to be improved now with increasing her Levemir insulin dosage on the last visit However he is getting tendency to hypoglycemia early morning and  occasionally in the afternoon Some of the afternoon hypoglycemia is related to eating lighter meals with less carbohydrate She continues to have variability in her blood sugar which is unexplained, highest reading 412.  Also maybe occasionally skipping her mealtime doses blood sugar is low normal Overall still well controlled for her age and multiple medical problems  PLAN: New insulin doses: Levemir 22 in am and 18 in pm For salad at lunch take only 16 units Apidra Continue monitoring at various times and call if having any hypoglycemia Have protein at every meal  HYPERTENSION: Blood pressure is low normal but is asymptomatic without dizziness  ATRIAL fibrillation: Her heart rate appears to be relatively high but is asymptomatic and she will followup with cardiologist   HYPOTHYROIDISM: No change in dosage needed   Annaliese Saez 08/26/2013, 10:28 AM    No visits with results within 2 Day(s) from this visit. Latest known visit with results is:  Appointment on 07/29/2013  Component Date Value Range Status  . Hemoglobin A1C 07/29/2013 9.5* 4.6 - 6.5 % Final   Glycemic Control Guidelines for People with Diabetes:Non Diabetic:  <6%Goal of Therapy: <7%Additional Action Suggested:  >8%   . Sodium 07/29/2013 140  135 - 145 mEq/L Final  . Potassium 07/29/2013 4.2  3.5 - 5.1 mEq/L Final  . Chloride 07/29/2013 105  96 - 112 mEq/L Final  . CO2 07/29/2013 28  19 - 32 mEq/L Final  . Glucose, Bld 07/29/2013 127* 70 - 99 mg/dL Final  . BUN 16/09/9603 24* 6 - 23 mg/dL Final  . Creatinine, Ser 07/29/2013 1.5* 0.4 - 1.2 mg/dL Final  . Total Bilirubin 07/29/2013 1.1  0.3 - 1.2 mg/dL Final  . Alkaline Phosphatase 07/29/2013 72  39 - 117 U/L Final  . AST 07/29/2013 20  0 - 37 U/L Final  . ALT 07/29/2013 16  0 - 35 U/L Final  . Total Protein 07/29/2013 7.0  6.0 - 8.3 g/dL Final  . Albumin 54/08/8118 3.3* 3.5 - 5.2 g/dL Final  . Calcium 14/78/2956 9.3  8.4 - 10.5 mg/dL Final  . GFR 21/30/8657 35.36*  >60.00 mL/min Final  . Free T4 07/29/2013 1.23  0.60 - 1.60 ng/dL Final  . TSH 84/69/6295 1.50  0.35 - 5.50 uIU/mL Final  . Microalb, Ur 07/29/2013 32.1* 0.0 - 1.9 mg/dL Final  . Creatinine,U 28/41/3244 236.4   Final  . Microalb Creat Ratio 07/29/2013 13.6  0.0 - 30.0 mg/g Final  . Color, Urine 07/29/2013 YELLOW  Yellow;Lt. Yellow Final  . APPearance 07/29/2013 CLEAR  Clear Final  . Specific Gravity, Urine 07/29/2013 1.025  1.000-1.030 Final  . pH 07/29/2013 6.0  5.0 - 8.0 Final  . Total Protein, Urine 07/29/2013 100  Negative Final  . Urine Glucose 07/29/2013 NEGATIVE  Negative Final  . Ketones, ur 07/29/2013 TRACE  Negative Final  . Bilirubin Urine 07/29/2013 NEGATIVE  Negative Final  . Hgb urine dipstick 07/29/2013 TRACE-INTACT  Negative Final  . Urobilinogen, UA 07/29/2013 1.0  0.0 - 1.0 Final  . Leukocytes, UA 07/29/2013 SMALL  Negative Final  . Nitrite 07/29/2013 NEGATIVE  Negative Final  . WBC, UA 07/29/2013 7-10/hpf  0-2/hpf Final  . RBC / HPF 07/29/2013 0-2/hpf  0-2/hpf Final  . Mucus, UA 07/29/2013 Presence of  None Final  . Squamous Epithelial / LPF 07/29/2013 Rare(0-4/hpf)  Rare(0-4/hpf) Final

## 2013-08-26 NOTE — Patient Instructions (Addendum)
Levemir 22 in am and 18 in pm  For salad at lunch take only 16 units

## 2013-09-16 ENCOUNTER — Telehealth: Payer: Self-pay | Admitting: Endocrinology

## 2013-09-16 MED ORDER — INSULIN LISPRO 100 UNIT/ML (KWIKPEN): 14.0000 [IU] | PEN_INJECTOR | Freq: Three times a day (TID) | SUBCUTANEOUS | Status: DC

## 2013-09-16 NOTE — Telephone Encounter (Signed)
[facility]

## 2013-09-19 ENCOUNTER — Ambulatory Visit: Payer: Medicare Other | Attending: Gynecology | Admitting: Gynecology

## 2013-09-19 ENCOUNTER — Encounter: Payer: Self-pay | Admitting: Gynecology

## 2013-09-19 VITALS — BP 150/84 | HR 108 | Temp 98.1°F | Resp 24 | Ht 63.0 in | Wt 153.4 lb

## 2013-09-19 DIAGNOSIS — C519 Malignant neoplasm of vulva, unspecified: Secondary | ICD-10-CM | POA: Insufficient documentation

## 2013-09-19 DIAGNOSIS — I4891 Unspecified atrial fibrillation: Secondary | ICD-10-CM | POA: Insufficient documentation

## 2013-09-19 DIAGNOSIS — Z794 Long term (current) use of insulin: Secondary | ICD-10-CM | POA: Insufficient documentation

## 2013-09-19 DIAGNOSIS — E119 Type 2 diabetes mellitus without complications: Secondary | ICD-10-CM | POA: Insufficient documentation

## 2013-09-19 DIAGNOSIS — Z79899 Other long term (current) drug therapy: Secondary | ICD-10-CM | POA: Insufficient documentation

## 2013-09-19 DIAGNOSIS — Z7901 Long term (current) use of anticoagulants: Secondary | ICD-10-CM | POA: Insufficient documentation

## 2013-09-19 DIAGNOSIS — Z8673 Personal history of transient ischemic attack (TIA), and cerebral infarction without residual deficits: Secondary | ICD-10-CM | POA: Insufficient documentation

## 2013-09-19 DIAGNOSIS — E039 Hypothyroidism, unspecified: Secondary | ICD-10-CM | POA: Insufficient documentation

## 2013-09-19 DIAGNOSIS — I10 Essential (primary) hypertension: Secondary | ICD-10-CM | POA: Insufficient documentation

## 2013-09-19 DIAGNOSIS — E785 Hyperlipidemia, unspecified: Secondary | ICD-10-CM | POA: Insufficient documentation

## 2013-09-19 NOTE — Patient Instructions (Addendum)
Plan to see Dr. Roselind Messier in Radiation Oncology at the [facility] on September 24, 2013 with RN evaluation at 8:30am and to see Dr. Roselind Messier at 9:00am.  Plan to follow up with Dr. Stanford Breed on 11/12/13 or sooner if needed.

## 2013-09-19 NOTE — Progress Notes (Signed)
Consult Note: Gyn-Onc   [name] 79 y.o. female  Chief Complaint  Patient presents with  . Vulvar Cancer    Follow up    Interval History: The patient returns today  last being seen in April 2013. Subsequent to that visit, she's had a number of medical problems including a stroke, atrial fibrillation, and some ophthalmologic problems. On direct questioning the patient does admit and her husband concurs that there is a new lesion on her vulva. She says it "stings". She has not had any bleeding.  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 At that time she underwent a modified radical vulvectomy and right inguinal lymphadenectomy. Lymph nodes and surgical margins were negative. She subsequently developed a recurrence on the left vulva in October 2008 underwent left modified radical vulvectomy. Given her age and overall poor performance status we did not perform an inguinal lymphadenectomy.  The patient subsequently had a third recurrence of October 2009 and this was excised and the rhomboid flap was used to fill the surgical defect. Surgical margins were again negative. Her most recent recurrences in September 2012 which is again excised.  She had a superficial wound separation which closed by secondary intent.      Review of System Ten point review of systems is negative except as noted above      No Known Allergies  Past Medical History  Diagnosis Date  . Diabetes mellitus   . Hypertension   . Hyperlipidemia   . Obesity   . Hypothyroidism   . Vulvar cancer, carcinoma 11/03/2011  . Stroke 05/2000    right brain CVA pre H&P 2001  . Atrial fibrillation     Past Surgical History  Procedure Laterality Date  . Vulvar lesion removal  2012  . Breast surgery      Lumpectomy  . Dilation and curettage of uterus    . Cystourethroplasty / ureteroneocystostomy    . Radical vulvectomy    . Lymphadenectomy  2007    R inguinal femoral  . Radical  vulvectomy  02/27/12  . Vulvar lesion removal  02/27/2012    Procedure: VULVAR LESION;  Surgeon: Jeannette Corpus, MD;  Location: [facility];  Service: Gynecology;  Laterality: N/A;    Current Outpatient Prescriptions  Medication Sig Dispense Refill  . atorvastatin (LIPITOR) 20 MG tablet Take 20 mg by mouth at bedtime.       [name] diltiazem (CARDIZEM CD) 360 MG 24 hr capsule Take 180 mg by mouth daily.       . furosemide (LASIX) 40 MG tablet 20 mg.       . glucose blood (ONE TOUCH TEST STRIPS) test strip Use as instructed  100 each  12  . insulin detemir (LEVEMIR) 100 UNIT/ML injection Inject 24 Units into the skin at bedtime. 24 units in am and 20 units in the pm uses flexpen      . insulin lispro (HUMALOG) 100 UNIT/ML SOPN Inject 14-28 Units into the skin 3 (three) times daily with meals. 16 units at breakfast, 22 units at lunch and 28 units at supper  6 pen  0  . KLOR-CON M10 10 MEQ tablet 10 mEq.      [name] levothyroxine (SYNTHROID, LEVOTHROID) 100 MCG tablet Take 100 mcg by mouth every morning.       . metoprolol succinate (TOPROL-XL) 25 MG 24 hr tablet Take 25 mg by mouth 2 (two) times daily.       [name] warfarin (  COUMADIN) 5 MG tablet Take 2.5-5 mg by mouth daily. Takes 5mg  daily except 2.5mg  on Monday, Wednesdays andFridays      . erythromycin ophthalmic ointment Place 1 application into the right eye 3 (three) times daily.       No current facility-administered medications for this visit.    History   Social History  . Marital Status: Married    Spouse Name: N/A    Number of Children: N/A  . Years of Education: N/A   Occupational History  . Not on file.   Social History Main Topics  . Smoking status: Former Smoker -- 0.25 packs/day for 18 years    Types: Cigarettes    Quit date: 11/03/1967  . Smokeless tobacco: Not on file  . Alcohol Use: No  . Drug Use: No  . Sexual Activity: Not Currently   Other Topics Concern  . Not on file   Social History Narrative  . No narrative on  file    Family History  Problem Relation Age of Onset  . Diabetes type II    . Coronary artery disease Mother   . Lung cancer Brother   . Diabetes type II Brother        Vitals: Blood pressure 150/84, pulse 108, temperature 98.1 F (36.7 C), resp. rate 24, height 5\' 3"  (1.6 m), weight 153 lb 6.4 oz (69.582 kg).  Physical Exam: In general the patient is a pleasant elderly white female no acute distress.  HEENT is negative except that her left eyelid has been partially closed surgically.. Neck is supple without thyromegaly.  There is no supraclavicular or inguinal adenopathy.  The abdomen is soft nontender no masses organomegaly or ascites is noted.  Pelvic exam EGBUS is status post radical vulvectomy with advancement flaps. She now has a new 4 cm fungating lesion just to the left of the anus. Assessment/Plan:recurrent vulvar cancer adjacent to the anus.   Given the location of this new lesion, I do not believe that surgery would be appropriate as the patient would require an extensive operation including resection of the anal sphincter colostomy. Therefore would like to refer her to radiation oncology for consideration of external beam radiation therapy to this lesion. The patient and her husband are in agreement with this plan.   Jeannette Corpus, MD 09/19/2013, 10:47 AM                         Consult Note: Gyn-Onc   [name] 79 y.o. female  Chief Complaint  Patient presents with  . Vulvar Cancer    Follow up    Interval History:   HPI:  No Known Allergies  Past Medical History  Diagnosis Date  . Diabetes mellitus   . Hypertension   . Hyperlipidemia   . Obesity   . Hypothyroidism   . Vulvar cancer, carcinoma 11/03/2011  . Stroke 05/2000    right brain CVA pre H&P 2001  . Atrial fibrillation     Past Surgical History  Procedure Laterality Date  . Vulvar lesion removal  2012  . Breast surgery      Lumpectomy  . Dilation  and curettage of uterus    . Cystourethroplasty / ureteroneocystostomy    . Radical vulvectomy    . Lymphadenectomy  2007    R inguinal femoral  . Radical vulvectomy  02/27/12  . Vulvar lesion removal  02/27/2012    Procedure: VULVAR LESION;  Surgeon: Jeannette Corpus, MD;  Location: [facility];  Service: Gynecology;  Laterality: N/A;    Current Outpatient Prescriptions  Medication Sig Dispense Refill  . atorvastatin (LIPITOR) 20 MG tablet Take 20 mg by mouth at bedtime.       [name] diltiazem (CARDIZEM CD) 360 MG 24 hr capsule Take 180 mg by mouth daily.       . furosemide (LASIX) 40 MG tablet 20 mg.       . glucose blood (ONE TOUCH TEST STRIPS) test strip Use as instructed  100 each  12  . insulin detemir (LEVEMIR) 100 UNIT/ML injection Inject 24 Units into the skin at bedtime. 24 units in am and 20 units in the pm uses flexpen      . insulin lispro (HUMALOG) 100 UNIT/ML SOPN Inject 14-28 Units into the skin 3 (three) times daily with meals. 16 units at breakfast, 22 units at lunch and 28 units at supper  6 pen  0  . KLOR-CON M10 10 MEQ tablet 10 mEq.      [name] levothyroxine (SYNTHROID, LEVOTHROID) 100 MCG tablet Take 100 mcg by mouth every morning.       . metoprolol succinate (TOPROL-XL) 25 MG 24 hr tablet Take 25 mg by mouth 2 (two) times daily.       [name] warfarin (COUMADIN) 5 MG tablet Take 2.5-5 mg by mouth daily. Takes 5mg  daily except 2.5mg  on Monday, Wednesdays andFridays      . erythromycin ophthalmic ointment Place 1 application into the right eye 3 (three) times daily.       No current facility-administered medications for this visit.    History   Social History  . Marital Status: Married    Spouse Name: N/A    Number of Children: N/A  . Years of Education: N/A   Occupational History  . Not on file.   Social History Main Topics  . Smoking status: Former Smoker -- 0.25 packs/day for 18 years    Types: Cigarettes    Quit date: 11/03/1967  . Smokeless tobacco: Not on file  .  Alcohol Use: No  . Drug Use: No  . Sexual Activity: Not Currently   Other Topics Concern  . Not on file   Social History Narrative  . No narrative on file    Family History  Problem Relation Age of Onset  . Diabetes type II    . Coronary artery disease Mother   . Lung cancer Brother   . Diabetes type II Brother     Review of Systems:  Vitals: Blood pressure 150/84, pulse 108, temperature 98.1 F (36.7 C), resp. rate 24, height 5\' 3"  (1.6 m), weight 153 lb 6.4 oz (69.582 kg).  Physical Exam:  Assessment/Plan:   Jeannette Corpus, MD 09/19/2013, 10:47 AM

## 2013-09-22 NOTE — Progress Notes (Addendum)
GU Location of Tumor / Histology: 4 cm fungating lesion to the left of the anus from recurrent vulvar cancer.  Patient presented to Dr. Stanford Breed on 09/19/2013 with signs/symptoms of:  A new lesion on her vulva.  Biopsies have not been done.  Past/Anticipated interventions by urology, if any: none  Past/Anticipated interventions by medical oncology, if any: no Weight changes, if any: no  Bowel/Bladder complaints, if any: constipation,  Eats prunes for this, frequency voiding takes lasix daily  Nausea/Vomiting, if any:   Pain issues, if any: no SAFETY ISSUES: s/p stroke, unsteady uses cane/or walker,  Prior radiation? No  Pacemaker/ICD? no  Possible current pregnancy? no  Is the patient on methotrexate? no  Current Complaints / other details: Married, 1 son living age 82,  1 daughter deceased age 84 dx leukemia at age 46, 1 brother died age 75 lung cancer,  Former smoker quit 1968 smoked cigarettes <1/2ppd for 40 years, left eye droops s/p surgery Dec 09 2012

## 2013-09-24 ENCOUNTER — Other Ambulatory Visit (INDEPENDENT_AMBULATORY_CARE_PROVIDER_SITE_OTHER): Payer: Medicare Other

## 2013-09-24 ENCOUNTER — Other Ambulatory Visit: Payer: Self-pay | Admitting: *Deleted

## 2013-09-24 ENCOUNTER — Encounter: Payer: Self-pay | Admitting: Radiation Oncology

## 2013-09-24 ENCOUNTER — Ambulatory Visit
Admission: RE | Admit: 2013-09-24 | Discharge: 2013-09-24 | Disposition: A | Discharge status: Home / Self Care | Payer: Medicare Other | Source: Ambulatory Visit | Attending: Radiation Oncology | Admitting: Radiation Oncology

## 2013-09-24 ENCOUNTER — Other Ambulatory Visit: Payer: Medicare Other

## 2013-09-24 VITALS — BP 153/93 | HR 90 | Temp 97.5°F | Resp 90 | Ht 63.0 in | Wt 154.0 lb

## 2013-09-24 DIAGNOSIS — I10 Essential (primary) hypertension: Secondary | ICD-10-CM | POA: Insufficient documentation

## 2013-09-24 DIAGNOSIS — Z7901 Long term (current) use of anticoagulants: Secondary | ICD-10-CM | POA: Insufficient documentation

## 2013-09-24 DIAGNOSIS — Z79899 Other long term (current) drug therapy: Secondary | ICD-10-CM | POA: Insufficient documentation

## 2013-09-24 DIAGNOSIS — E039 Hypothyroidism, unspecified: Secondary | ICD-10-CM

## 2013-09-24 DIAGNOSIS — I4891 Unspecified atrial fibrillation: Secondary | ICD-10-CM | POA: Insufficient documentation

## 2013-09-24 DIAGNOSIS — E119 Type 2 diabetes mellitus without complications: Secondary | ICD-10-CM | POA: Insufficient documentation

## 2013-09-24 DIAGNOSIS — C801 Malignant (primary) neoplasm, unspecified: Secondary | ICD-10-CM | POA: Insufficient documentation

## 2013-09-24 DIAGNOSIS — C519 Malignant neoplasm of vulva, unspecified: Secondary | ICD-10-CM | POA: Insufficient documentation

## 2013-09-24 DIAGNOSIS — E785 Hyperlipidemia, unspecified: Secondary | ICD-10-CM | POA: Insufficient documentation

## 2013-09-24 DIAGNOSIS — Z8673 Personal history of transient ischemic attack (TIA), and cerebral infarction without residual deficits: Secondary | ICD-10-CM | POA: Insufficient documentation

## 2013-09-24 DIAGNOSIS — Z794 Long term (current) use of insulin: Secondary | ICD-10-CM | POA: Insufficient documentation

## 2013-09-24 HISTORY — DX: Malignant (primary) neoplasm, unspecified: C80.1

## 2013-09-24 LAB — CBC WITH DIFFERENTIAL/PLATELET
Basophils Relative: 0.1 % (ref 0.0–3.0)
Eosinophils Relative: 1.8 % (ref 0.0–5.0)
HCT: 39.4 % (ref 36.0–46.0)
Lymphs Abs: 1.1 10*3/uL (ref 0.7–4.0)
MCV: 98.3 fl (ref 78.0–100.0)
Monocytes Absolute: 0.6 10*3/uL (ref 0.1–1.0)
Neutro Abs: 5.9 10*3/uL (ref 1.4–7.7)
RBC: 4.01 Mil/uL (ref 3.87–5.11)
WBC: 7.8 10*3/uL (ref 4.5–10.5)

## 2013-09-24 MED ORDER — GLUCOSE BLOOD VI STRP: ORAL_STRIP | Status: DC

## 2013-09-24 NOTE — Progress Notes (Signed)
Please see the Nurse Progress Note in the MD Initial Consult Encounter for this patient. 

## 2013-09-24 NOTE — Progress Notes (Signed)
Radiation Oncology         [phone] ________________________________  Initial outpatient Consultation  Name: [name] MRN: [id number]  Date: 09/24/2013  DOB: 1926-06-18  WU:[id number],JYNW, MD  De Blanch *   REFERRING PHYSICIAN: De Blanch *  DIAGNOSIS: Recurrent vulvar carcinoma  HISTORY OF PRESENT ILLNESS::[name] is a 79 y.o. female who is seen out of the courtesy of Dr. Darlyn Read for an opinion concerning radiation therapy as part of management of the patient's recurrent vulvar carcinomaThe patient had vulvar carcinoma initially diagnosed in September 2007. At that time she underwent a modified radical vulvectomy and right inguinal lymphadenectomy. Lymph nodes and surgical margins were negative. She subsequently developed a recurrence on the left vulva in October 2008 underwent left modified radical vulvectomy. Given her age and overall poor performance status she did not did not have  an inguinal lymphadenectomy. The patient subsequently had a third recurrence of October 2009 and this was excised and the rhomboid flap was used to fill the surgical defect. Surgical margins were again negative. Her most recent recurrences in September 2012 which is again excised. She had a superficial wound separation which closed by secondary intent.  She also developed a recurrence in March of 2013 with biopsies positive on the right vulva and the left vulva. Due to other medical issues the patient did not followup in gynecologic oncology as recommended.  the patient in the interim had developed a stroke atrial fibrillation and left cornea problems. More recently the patient developed some stinging in the perineum and did followup in gynecologic oncology. The patient was examined and found to have a 4 cm fungating lesion just to the left of the anus.  in light of the location of recurrence extensive operation including the anus and subsequent colostomy would be necessary.  Given the patient's age medical issues she was not felt to be a candidate for this aggressive surgery. Patient is now seen in radiation oncology to be considered for treatment.   PREVIOUS RADIATION THERAPY: No  PAST MEDICAL HISTORY:  has a past medical history of Diabetes mellitus; Hypertension; Hyperlipidemia; Obesity; Hypothyroidism; Vulvar cancer, carcinoma (11/03/2011); Stroke (05/2000); Atrial fibrillation; and Cancer (2007).    PAST SURGICAL HISTORY: Past Surgical History  Procedure Laterality Date  . Vulvar lesion removal  2012  . Breast surgery      Lumpectomy  . Dilation and curettage of uterus    . Cystourethroplasty / ureteroneocystostomy    . Radical vulvectomy    . Lymphadenectomy  2007    R inguinal femoral  . Radical vulvectomy  02/27/12  . Vulvar lesion removal  02/27/2012    Procedure: VULVAR LESION;  Surgeon: Jeannette Corpus, MD;  Location: [facility];  Service: Gynecology;  Laterality: N/A;    FAMILY HISTORY: family history includes Coronary artery disease in her mother; Diabetes type II in her brother and another family member; Lung cancer in her brother.  SOCIAL HISTORY:  reports that she quit smoking about 45 years ago. Her smoking use included Cigarettes. She has a 4.5 pack-year smoking history. She does not have any smokeless tobacco history on file. She reports that she does not drink alcohol or use illicit drugs.  ALLERGIES: Review of patient's allergies indicates no known allergies.  MEDICATIONS:  Current Outpatient Prescriptions  Medication Sig Dispense Refill  . atorvastatin (LIPITOR) 20 MG tablet Take 20 mg by mouth at bedtime.       [name] diltiazem (CARDIZEM CD) 360 MG 24 hr  capsule Take 180 mg by mouth daily.       . furosemide (LASIX) 40 MG tablet Take 20 mg by mouth daily.       [name] glucose blood (ONE TOUCH TEST STRIPS) test strip Use as instructed  100 each  12  . insulin detemir (LEVEMIR) 100 UNIT/ML injection Inject 24 Units into the skin at bedtime.  24 units in am and 20 units in the pm uses flexpen      . insulin lispro (HUMALOG) 100 UNIT/ML SOPN Inject 14-28 Units into the skin 3 (three) times daily with meals. 16 units at breakfast, 22 units at lunch and 28 units at supper  6 pen  0  . KLOR-CON M10 10 MEQ tablet Take 10 mEq by mouth daily.       [name] levothyroxine (SYNTHROID, LEVOTHROID) 100 MCG tablet Take 100 mcg by mouth every morning.       . metoprolol succinate (TOPROL-XL) 25 MG 24 hr tablet Take 25 mg by mouth 2 (two) times daily.       [name] warfarin (COUMADIN) 5 MG tablet Take 2.5-5 mg by mouth daily. Takes 5mg  daily except 2.5mg  on Monday, Wednesdays andFridays       No current facility-administered medications for this encounter.    REVIEW OF SYSTEMS:  A 15 point review of systems is documented in the electronic medical record. This was obtained by the nursing staff. However, I reviewed this with the patient to discuss relevant findings and make appropriate changes.  She has visual problems related to her left eyelid being partially sewn shut. This was performed to limit further corneal deterioration. Patient complains of the stinging sensation in the perineum. This is more bothersome with urination and sitting. She denies any hematuria. She denies any new pain within the pelvis or low back area. She denies any flank pain. She has balance issues related to her prior stroke. She ambulates with the assistance of a walker   PHYSICAL EXAM:  height is 5\' 3"  (1.6 m) and weight is 154 lb (69.854 kg). Her oral temperature is 97.5 F (36.4 C). Her blood pressure is 153/93 and her pulse is 90. Her respiration is 90.   BP 153/93  Pulse 90  Temp(Src) 97.5 F (36.4 C) (Oral)  Resp 90  Ht 5\' 3"  (1.6 m)  Wt 154 lb (69.854 kg)  BMI 27.29 kg/m2  General Appearance:    Alert, cooperative, no distress, appears stated age, accompanied by her husband on the medication today   Head:    Normocephalic, without obvious abnormality, atraumatic  Eyes:     PERRL, significant anatomic changes involving the left eye related to corneal issues as above         Nose:   Nares normal, septum midline, mucosa normal, no drainage    or sinus tenderness  Throat:   Lips, mucosa, and tongue normal;  gums normal  Neck:   Supple, symmetrical, trachea midline, no adenopathy;    thyroid:  no enlargement/tenderness/nodules; no carotid   bruit or JVD  Back:     Symmetric, no curvature, ROM normal, no CVA tenderness  Lungs:     Clear to auscultation bilaterally, respirations unlabored  Chest Wall:    No tenderness or deformity   Heart:    Regular rate and rhythm,      Abdomen:     Soft, non-tender, bowel sounds active all four quadrants,    no masses, no organomegaly  Genitalia:   labia majora surgically absent  with flaps in place a 4 cm fungating mass anterior and lateral to the anus is noted   Rectal:   normal sphincter tone, the tumor mass approaches the anus but no obvious mucosal involvement   Extremities:   Extremities normal, atraumatic, no cyanosis or edema  Pulses:   2+ and symmetric all extremities  Skin:   Skin color, texture, turgor normal, no rashes or lesions  Lymph nodes:   Cervical, supraclavicular, and axillary nodes normal  Neurologic:   CNII-XII intact, normal strength, sensation and reflexes    throughout    KPS = 70  100 - Normal; no complaints; no evidence of disease. 90   - Able to carry on normal activity; minor signs or symptoms of disease. 80   - Normal activity with effort; some signs or symptoms of disease. 44   - Cares for self; unable to carry on normal activity or to do active work. 60   - Requires occasional assistance, but is able to care for most of his personal needs. 50   - Requires considerable assistance and frequent medical care. 40   - Disabled; requires special care and assistance. 30   - Severely disabled; hospital admission is indicated although death not imminent. 20   - Very sick; hospital admission  necessary; active supportive treatment necessary. 10   - Moribund; fatal processes progressing rapidly. 0     - Dead  Karnofsky DA, Abelmann WH, Craver LS and Burchenal [facility] [phone]) The use of the nitrogen mustards in the palliative treatment of carcinoma: with particular reference to bronchogenic carcinoma Cancer 1 634-56  LABORATORY DATA:  Lab Results  Component Value Date   WBC 7.2 08/11/2012   HGB 10.5* 08/11/2012   HCT 33.5* 08/11/2012   MCV 94.1 08/11/2012   PLT 149* 08/11/2012   Lab Results  Component Value Date   NA 140 07/29/2013   K 4.2 07/29/2013   CL 105 07/29/2013   CO2 28 07/29/2013   Lab Results  Component Value Date   ALT 16 07/29/2013   AST 20 07/29/2013   ALKPHOS 72 07/29/2013   BILITOT 1.1 07/29/2013     RADIOGRAPHY: No results found.    IMPRESSION: Recurrent vulvar carcinoma.  As above to surgically addressed this issue would require a permanent colostomy and surgical removal of the anus. Patient given her age and medical issues is not a candidate for this aggressive surgery. She would be a candidate for radiation therapy. In light of the patient's poor performance status this treatment however would be quite difficult for her. I did discuss the treatment course side effects and potential toxicities of radiation therapy with treatment to this area. The patient appears to understand and wishes to proceed with planned course of treatment. Prior to scheduling simulation I would like to order a PET scan to rule out other areas of recurrence particularly given the patient's history of several prior surgeries.  PLAN: Simulation and treatment after PET scan completion. I anticipate approximately 6 weeks of radiation therapy as part of the patient's management  I spent 60 minutes minutes face to face with the patient and more than 50% of that time was spent in counseling and/or coordination of care.    ------------------------------------------------  -----------------------------------  [name], PhD, MD

## 2013-09-25 LAB — BASIC METABOLIC PANEL
CO2: 24 mEq/L (ref 19–32)
Chloride: 104 mEq/L (ref 96–112)
Potassium: 4.5 mEq/L (ref 3.5–5.1)

## 2013-09-25 LAB — LIPID PANEL
Cholesterol: 108 mg/dL (ref 0–200)
LDL Cholesterol: 50 mg/dL (ref 0–99)
Total CHOL/HDL Ratio: 3

## 2013-09-26 LAB — T4, FREE: Free T4: 1.45 ng/dL (ref 0.60–1.60)

## 2013-09-29 ENCOUNTER — Encounter: Payer: Self-pay | Admitting: Endocrinology

## 2013-09-29 ENCOUNTER — Ambulatory Visit (INDEPENDENT_AMBULATORY_CARE_PROVIDER_SITE_OTHER): Payer: Medicare Other | Admitting: Endocrinology

## 2013-09-29 ENCOUNTER — Ambulatory Visit (INDEPENDENT_AMBULATORY_CARE_PROVIDER_SITE_OTHER): Payer: Medicare Other | Admitting: Pharmacist

## 2013-09-29 VITALS — BP 130/80 | HR 110 | Temp 98.5°F | Resp 12 | Ht 64.0 in | Wt 150.9 lb

## 2013-09-29 DIAGNOSIS — I6350 Cerebral infarction due to unspecified occlusion or stenosis of unspecified cerebral artery: Secondary | ICD-10-CM

## 2013-09-29 DIAGNOSIS — I639 Cerebral infarction, unspecified: Secondary | ICD-10-CM

## 2013-09-29 DIAGNOSIS — I4891 Unspecified atrial fibrillation: Secondary | ICD-10-CM

## 2013-09-29 NOTE — Progress Notes (Signed)
Patient ID: [name], female   DOB: 25-Dec-1926, 79 y.o.   MRN: [id number]  [name] is an 79 y.o. female.   Reason for Appointment: Diabetes follow-up   History of Present Illness   Diagnosis: Type 2 DIABETES MELITUS, long-standing  Previous history: She has been on insulin for several years and usually is compliant with her basal bolus regimen. However she usually has significant fluctuation in her blood sugars and difficult to have consistent trend. Overall blood sugars are usually high in  RECENT history:   The patient's diabetes control appears to be overall fair after adjusting her insulin the last couple of times. She has had mostly high readings around lunchtime and after supper but not consistently. Overall her lowest readings are in the morning fasting including one episode of hypoglycemia Not clear why her readings are higher after supper now despite taking relatively large doses of mealtime insulin and no change in diet  She thinks she is compliant with her mealtime insulin and is taking the doses discussed on the last visit. Insulin regimen: Levemir 24 in the morning and 18 in the evening, Humalog 14-22-28 AC           Proper timing of medications in relation to meals: Yes.          Monitors blood glucose:  3 times a day.    Glucometer: One Touch.          Blood Glucose readings from meter download: readings before breakfast:  50-234 with median 137. Lunchtime median 249, lowest reading 153 Suppertime not checked recently, previously 59-215. Late evening 160-327 with median about 215 Overall median 177, previously 139. A couple of readings are normal which were done by her husband  Hypoglycemia frequency:    Has had occasional       Meals: 3 meals per day.  for breakfast he will have an egg and toast but sometimes cereal. Suppertime about 5:30 p.m.    Physical activity:  minimal           Complications: are: Nephropathy    Eye exams: Have been regular  Lab Results   Component Value Date   HGBA1C 9.5* 07/29/2013    Lab Results  Component Value Date   MICROALBUR 32.1* 07/29/2013     Wt Readings from Last 3 Encounters:  09/29/13 150 lb 14.4 oz (68.448 kg)  09/24/13 154 lb (69.854 kg)  09/19/13 153 lb 6.4 oz (69.582 kg)      Medication List       This list is accurate as of: 09/29/13 11:36 AM.  Always use your most recent med list.               atorvastatin 20 MG tablet  Commonly known as:  LIPITOR  Take 20 mg by mouth at bedtime.     diltiazem 360 MG 24 hr capsule  Commonly known as:  CARDIZEM CD  Take 180 mg by mouth daily.     furosemide 40 MG tablet  Commonly known as:  LASIX  Take 20 mg by mouth daily.     glucose blood test strip  Commonly known as:  ONE TOUCH TEST STRIPS  Use as instructed to check blood sugars 4 times per day dx code 250.00     insulin detemir 100 UNIT/ML injection  Commonly known as:  LEVEMIR  Inject 24 Units into the skin at bedtime. 24 units in am and 20 units in the pm uses flexpen  insulin lispro 100 UNIT/ML Sopn  Commonly known as:  HUMALOG  Inject 14-28 Units into the skin 3 (three) times daily with meals. 16 units at breakfast, 22 units at lunch and 28 units at supper     KLOR-CON M10 10 MEQ tablet  Generic drug:  potassium chloride  Take 10 mEq by mouth daily.     levothyroxine 100 MCG tablet  Commonly known as:  SYNTHROID, LEVOTHROID  Take 100 mcg by mouth every morning.     metoprolol succinate 25 MG 24 hr tablet  Commonly known as:  TOPROL-XL  Take 25 mg by mouth 2 (two) times daily.     warfarin 5 MG tablet  Commonly known as:  COUMADIN  Take 2.5-5 mg by mouth daily. Takes 5mg  daily except 2.5mg  on Monday, Wednesdays andFridays        Allergies: No Known Allergies  Past Medical History  Diagnosis Date  . Diabetes mellitus   . Hypertension   . Hyperlipidemia   . Obesity   . Hypothyroidism   . Vulvar cancer, carcinoma 11/03/2011  . Stroke 05/2000    right brain CVA  pre H&P 2001  . Atrial fibrillation   . Cancer 2007    vulva-invasive well diff squamous cell ca    Past Surgical History  Procedure Laterality Date  . Vulvar lesion removal  2012  . Breast surgery      Lumpectomy  . Dilation and curettage of uterus    . Cystourethroplasty / ureteroneocystostomy    . Radical vulvectomy    . Lymphadenectomy  2007    R inguinal femoral  . Radical vulvectomy  02/27/12  . Vulvar lesion removal  02/27/2012    Procedure: VULVAR LESION;  Surgeon: Jeannette Corpus, MD;  Location: [facility];  Service: Gynecology;  Laterality: N/A;    Family History  Problem Relation Age of Onset  . Diabetes type II    . Coronary artery disease Mother   . Lung cancer Brother   . Diabetes type II Brother     Social History:  reports that she quit smoking about 45 years ago. Her smoking use included Cigarettes. She has a 4.5 pack-year smoking history. She does not have any smokeless tobacco history on file. She reports that she does not drink alcohol or use illicit drugs.  Review of Systems:  HYPERTENSION:  her blood pressure has been consistently controlled with diltiazem and metoprolol, also being used for rate control She has a history of atrial fibrillation, taking Coumadin Occasional  shortness of breath with increased activity  HYPERLIPIDEMIA: The lipid abnormality consists of elevated LDL.  She has had persistent hoarseness  She is still having problems with her eye and is having  scarring of her eyelid  She has a long-standing history of hypothyroidism and recent TSH was 1.5    Chronic kidney disease/nephropathy: Has had mild increase in creatinine  of 2.7, has history of proteinuria   Examination:   BP 130/80  Pulse 110  Temp(Src) 98.5 F (36.9 C)  Resp 12  Ht 5\' 4"  (1.626 m)  Wt 150 lb 14.4 oz (68.448 kg)  BMI 25.89 kg/m2  SpO2 94%  Body mass index is 25.89 kg/(m^2).   No pedal edema  ASSESSMENT/ PLAN::   Diabetes type 2   The patient's  diabetes control appears to be not as good as last time with higher readings at lunch and bedtime Will increase her coverage for breakfast and supper by 2 units on the NovoLog  and continue the same dose of Levemir since she may occasionally get low normal readings before breakfast and supper Overall still well controlled for her age and multiple medical problems Discussed adding protein at every meal and avoid cereal in the morning    Maxmilian Trostel 09/29/2013, 11:36 AM    No visits with results within 2 Day(s) from this visit. Latest known visit with results is:  Appointment on 09/24/2013  Component Date Value Range Status  . Free T4 09/24/2013 1.45  0.60 - 1.60 ng/dL Final  . TSH 91/47/8295 2.67  0.35 - 5.50 uIU/mL Final  . Cholesterol 09/24/2013 108  0 - 200 mg/dL Final   ATP III Classification       Desirable:  < 200 mg/dL               Borderline High:  200 - 239 mg/dL          High:  > = 621 mg/dL  . Triglycerides 09/24/2013 136.0  0.0 - 149.0 mg/dL Final   Normal:  <308 mg/dLBorderline High:  150 - 199 mg/dL  . HDL 09/24/2013 31.10* >39.00 mg/dL Final  . VLDL 65/78/4696 27.2  0.0 - 40.0 mg/dL Final  . LDL Cholesterol 09/24/2013 50  0 - 99 mg/dL Final  . Total CHOL/HDL Ratio 09/24/2013 3   Final                  Men          Women1/2 Average Risk     3.4          3.3Average Risk          5.0          4.42X Average Risk          9.6          7.13X Average Risk          15.0          11.0                      . Sodium 09/24/2013 138  135 - 145 mEq/L Final  . Potassium 09/24/2013 4.5  3.5 - 5.1 mEq/L Final  . Chloride 09/24/2013 104  96 - 112 mEq/L Final  . CO2 09/24/2013 24  19 - 32 mEq/L Final  . Glucose, Bld 09/24/2013 242* 70 - 99 mg/dL Final  . BUN 29/52/8413 24* 6 - 23 mg/dL Final  . Creatinine, Ser 09/24/2013 1.6* 0.4 - 1.2 mg/dL Final  . Calcium 24/40/1027 9.1  8.4 - 10.5 mg/dL Final  . GFR 25/36/6440 33.52* >60.00 mL/min Final  . WBC 09/24/2013 7.8  4.5 - 10.5 K/uL Final   . RBC 09/24/2013 4.01  3.87 - 5.11 Mil/uL Final  . Hemoglobin 09/24/2013 13.0  12.0 - 15.0 g/dL Final  . HCT 34/74/2595 39.4  36.0 - 46.0 % Final  . MCV 09/24/2013 98.3  78.0 - 100.0 fl Final  . MCHC 09/24/2013 33.1  30.0 - 36.0 g/dL Final  . RDW 63/87/5643 16.3* 11.5 - 14.6 % Final  . Platelets 09/24/2013 157.0  150.0 - 400.0 K/uL Final  . Neutrophils Relative % 09/24/2013 75.3  43.0 - 77.0 % Final  . Lymphocytes Relative 09/24/2013 14.6  12.0 - 46.0 % Final  . Monocytes Relative 09/24/2013 8.2  3.0 - 12.0 % Final  . Eosinophils Relative 09/24/2013 1.8  0.0 - 5.0 % Final  . Basophils Relative 09/24/2013 0.1  0.0 - 3.0 %  Final  . Neutro Abs 09/24/2013 5.9  1.4 - 7.7 K/uL Final  . Lymphs Abs 09/24/2013 1.1  0.7 - 4.0 K/uL Final  . Monocytes Absolute 09/24/2013 0.6  0.1 - 1.0 K/uL Final  . Eosinophils Absolute 09/24/2013 0.1  0.0 - 0.7 K/uL Final  . Basophils Absolute 09/24/2013 0.0  0.0 - 0.1 K/uL Final

## 2013-09-29 NOTE — Patient Instructions (Addendum)
Levemir 24 in the morning and 18 in the evening, Humalog 16-22-30 AC    Have a protein with small bowl cereal

## 2013-10-06 ENCOUNTER — Telehealth: Payer: Self-pay | Admitting: *Deleted

## 2013-10-06 NOTE — Telephone Encounter (Signed)
xxxx 

## 2013-10-07 ENCOUNTER — Telehealth: Payer: Self-pay | Admitting: Oncology

## 2013-10-07 ENCOUNTER — Encounter (HOSPITAL_COMMUNITY)
Admission: RE | Admit: 2013-10-07 | Discharge: 2013-10-07 | Disposition: A | Discharge status: Home / Self Care | Payer: Medicare Other | Source: Ambulatory Visit | Attending: Radiation Oncology | Admitting: Radiation Oncology

## 2013-10-07 DIAGNOSIS — E119 Type 2 diabetes mellitus without complications: Secondary | ICD-10-CM | POA: Insufficient documentation

## 2013-10-07 DIAGNOSIS — N269 Renal sclerosis, unspecified: Secondary | ICD-10-CM | POA: Insufficient documentation

## 2013-10-07 DIAGNOSIS — I700 Atherosclerosis of aorta: Secondary | ICD-10-CM | POA: Insufficient documentation

## 2013-10-07 DIAGNOSIS — G319 Degenerative disease of nervous system, unspecified: Secondary | ICD-10-CM | POA: Insufficient documentation

## 2013-10-07 DIAGNOSIS — C519 Malignant neoplasm of vulva, unspecified: Secondary | ICD-10-CM | POA: Insufficient documentation

## 2013-10-07 DIAGNOSIS — I2510 Atherosclerotic heart disease of native coronary artery without angina pectoris: Secondary | ICD-10-CM | POA: Insufficient documentation

## 2013-10-07 DIAGNOSIS — I7789 Other specified disorders of arteries and arterioles: Secondary | ICD-10-CM | POA: Insufficient documentation

## 2013-10-07 DIAGNOSIS — I10 Essential (primary) hypertension: Secondary | ICD-10-CM | POA: Insufficient documentation

## 2013-10-07 DIAGNOSIS — R911 Solitary pulmonary nodule: Secondary | ICD-10-CM | POA: Insufficient documentation

## 2013-10-07 MED ORDER — FLUDEOXYGLUCOSE F - 18 (FDG) INJECTION
18.9000 | Freq: Once | INTRAVENOUS | Status: AC | PRN
  Administered 2013-10-07: 18.9 via INTRAVENOUS

## 2013-10-07 NOTE — Telephone Encounter (Signed)
Returned [employer] [place]'s phone call.  She said she has received multiple calls today regarding an appointment tomorrow.  She does not have an appointment tomorrow.  Assured her that her next appointment is on Thursday at 11:00 for CT Sim.

## 2013-10-09 ENCOUNTER — Ambulatory Visit
Admission: RE | Admit: 2013-10-09 | Discharge: 2013-10-09 | Disposition: A | Discharge status: Home / Self Care | Payer: Medicare Other | Source: Ambulatory Visit | Attending: Radiation Oncology | Admitting: Radiation Oncology

## 2013-10-09 DIAGNOSIS — I4891 Unspecified atrial fibrillation: Secondary | ICD-10-CM | POA: Insufficient documentation

## 2013-10-09 DIAGNOSIS — C519 Malignant neoplasm of vulva, unspecified: Secondary | ICD-10-CM | POA: Insufficient documentation

## 2013-10-09 DIAGNOSIS — Z510 Encounter for antineoplastic radiation therapy: Secondary | ICD-10-CM | POA: Insufficient documentation

## 2013-10-09 DIAGNOSIS — Z79899 Other long term (current) drug therapy: Secondary | ICD-10-CM | POA: Insufficient documentation

## 2013-10-09 DIAGNOSIS — Z7901 Long term (current) use of anticoagulants: Secondary | ICD-10-CM | POA: Insufficient documentation

## 2013-10-09 DIAGNOSIS — E119 Type 2 diabetes mellitus without complications: Secondary | ICD-10-CM | POA: Insufficient documentation

## 2013-10-09 DIAGNOSIS — R5381 Other malaise: Secondary | ICD-10-CM | POA: Insufficient documentation

## 2013-10-09 DIAGNOSIS — N949 Unspecified condition associated with female genital organs and menstrual cycle: Secondary | ICD-10-CM | POA: Insufficient documentation

## 2013-10-09 DIAGNOSIS — Z794 Long term (current) use of insulin: Secondary | ICD-10-CM | POA: Insufficient documentation

## 2013-10-09 NOTE — Progress Notes (Signed)
  Radiation Oncology         [phone] ________________________________  Name: [name] MRN: [id number]  Date: 10/09/2013  DOB: 1926-02-04  SIMULATION AND TREATMENT PLANNING NOTE  DIAGNOSIS:  Recurrent vulvar carcinoma  NARRATIVE:  The patient was brought to the CT Simulation planning suite.  Identity was confirmed.  All relevant records and images related to the planned course of therapy were reviewed.  The patient freely provided informed written consent to proceed with treatment after reviewing the details related to the planned course of therapy. The consent form was witnessed and verified by the simulation staff.  Then, the patient was set-up in a stable reproducible  supine position for radiation therapy.  CT images were obtained.  Surface markings were placed.  The CT images were loaded into the planning software.  Then the target and avoidance structures were contoured.  Treatment planning then occurred.  The radiation prescription was entered and confirmed.  Then, I designed and supervised the construction of a total of 1 medically necessary complex treatment devices.  I have requested : Intensity Modulated Radiotherapy (IMRT) is medically necessary for this case for the following reason:  Rectal sparing, small bowel sparing and dose homogeneity.  I have ordered:dose calc.  PLAN:  The patient will receive 60 Gy in 30 fractions.  ________________________________  -----------------------------------  [name], PhD, MD

## 2013-10-14 ENCOUNTER — Ambulatory Visit (INDEPENDENT_AMBULATORY_CARE_PROVIDER_SITE_OTHER): Payer: Medicare Other | Admitting: Pharmacist

## 2013-10-14 DIAGNOSIS — I6350 Cerebral infarction due to unspecified occlusion or stenosis of unspecified cerebral artery: Secondary | ICD-10-CM

## 2013-10-14 DIAGNOSIS — I4891 Unspecified atrial fibrillation: Secondary | ICD-10-CM

## 2013-10-14 DIAGNOSIS — I639 Cerebral infarction, unspecified: Secondary | ICD-10-CM

## 2013-10-16 ENCOUNTER — Telehealth: Payer: Self-pay | Admitting: Endocrinology

## 2013-10-16 ENCOUNTER — Other Ambulatory Visit: Payer: Self-pay | Admitting: *Deleted

## 2013-10-16 MED ORDER — ATORVASTATIN CALCIUM 20 MG PO TABS: 20.0000 mg | ORAL_TABLET | Freq: Every day | ORAL | Status: DC

## 2013-10-16 NOTE — Telephone Encounter (Signed)
rx sent

## 2013-10-20 ENCOUNTER — Ambulatory Visit
Admission: RE | Admit: 2013-10-20 | Discharge: 2013-10-20 | Disposition: A | Discharge status: Home / Self Care | Payer: Medicare Other | Source: Ambulatory Visit | Attending: Radiation Oncology | Admitting: Radiation Oncology

## 2013-10-20 DIAGNOSIS — C519 Malignant neoplasm of vulva, unspecified: Secondary | ICD-10-CM

## 2013-10-20 NOTE — Progress Notes (Signed)
   Department of Radiation Oncology  Phone:  [phone] Fax:        [phone]  Intensity modulated radiation therapy device note  Today the patient had set up for her pelvic radiation therapy. The patient will be treated with helical intensity modulated radiation therapy. Several sinogram segments will be used to deliver the treatment. This qualifies as a IMRT treatment device.  -----------------------------------  [name], PhD, MD

## 2013-10-21 ENCOUNTER — Ambulatory Visit
Admission: RE | Admit: 2013-10-21 | Discharge: 2013-10-21 | Disposition: A | Discharge status: Home / Self Care | Payer: Medicare Other | Source: Ambulatory Visit | Attending: Radiation Oncology | Admitting: Radiation Oncology

## 2013-10-21 VITALS — BP 154/72 | HR 95 | Temp 97.4°F | Ht 64.0 in | Wt 149.8 lb

## 2013-10-21 DIAGNOSIS — C519 Malignant neoplasm of vulva, unspecified: Secondary | ICD-10-CM

## 2013-10-21 NOTE — Progress Notes (Signed)
[facility]    Radiation Oncology [address] [place]     [name], M.D. [place], [place] [address]               [name], M.D., Ph.D. Phone: [phone]      [name] A. [name], M.D. Fax: [phone]      [name], M.D., Ph.D.         [name], M.D.         [name], M.D Weekly Treatment Management Note  Name: [name]     MRN: [id number]        CSN: [id number] Date: 10/21/2013      DOB: 05/17/1926  CC: [name], MD         [name]    Status: Outpatient  Diagnosis: The encounter diagnosis was Vulva cancer.  Current Dose: 4 Gy  Current Fraction: 2  Planned Dose: 60 Gy  Narrative: [name] was seen today for weekly treatment management. The chart was checked and MVCT  were reviewed. She is tolerating her treatments well at this time without any side effects.  Review of patient's allergies indicates no known allergies. Current Outpatient Prescriptions  Medication Sig Dispense Refill  . atorvastatin (LIPITOR) 20 MG tablet Take 1 tablet (20 mg total) by mouth at bedtime.  30 tablet  6  . diltiazem (CARDIZEM CD) 360 MG 24 hr capsule Take 180 mg by mouth daily.       . furosemide (LASIX) 40 MG tablet Take 20 mg by mouth daily.       [name] glucose blood (ONE TOUCH TEST STRIPS) test strip Use as instructed to check blood sugars 4 times per day dx code 250.00  150 each  12  . insulin detemir (LEVEMIR) 100 UNIT/ML injection Inject 24 Units into the skin at bedtime. 24 units in am and 20 units in the pm uses flexpen      . insulin lispro (HUMALOG) 100 UNIT/ML SOPN Inject 14-28 Units into the skin 3 (three) times daily with meals. 16 units at breakfast, 22 units at lunch and 28 units at supper  6 pen  0  . KLOR-CON M10 10 MEQ tablet Take 10 mEq by mouth daily.       [name] levothyroxine (SYNTHROID, LEVOTHROID) 100 MCG tablet Take 100 mcg by mouth every morning.       . metoprolol succinate (TOPROL-XL) 25 MG 24 hr tablet Take 25 mg by mouth 2  (two) times daily.       [name] warfarin (COUMADIN) 5 MG tablet Take 2.5-5 mg by mouth daily. Takes 5mg  daily except 2.5mg  on Monday, Wednesdays andFridays       No current facility-administered medications for this encounter.   Labs:  Lab Results  Component Value Date   WBC 7.8 09/24/2013   HGB 13.0 09/24/2013   HCT 39.4 09/24/2013   MCV 98.3 09/24/2013   PLT 157.0 09/24/2013   Lab Results  Component Value Date   CREATININE 1.6* 09/24/2013   BUN 24* 09/24/2013   NA 138 09/24/2013   K 4.5 09/24/2013   CL 104 09/24/2013   CO2 24 09/24/2013   Lab Results  Component Value Date   ALT 16 07/29/2013   AST 20 07/29/2013   PHOS 2.8 09/20/2011   BILITOT 1.1 07/29/2013    Physical Examination:  height is 5\' 4"  (1.626 m) and weight is 149 lb 12.8 oz (67.949 kg). Her temperature is 97.4 F (36.3 C).  Her blood pressure is 154/72 and her pulse is 95.    Wt Readings from Last 3 Encounters:  10/21/13 149 lb 12.8 oz (67.949 kg)  09/29/13 150 lb 14.4 oz (68.448 kg)  09/24/13 154 lb (69.854 kg)     Lungs - Normal respiratory effort, chest expands symmetrically. Lungs are clear to auscultation, no crackles or wheezes.  Heart has regular rhythm and rate  Abdomen is soft and non tender with normal bowel sounds  Assessment:  Patient tolerating treatments well  Plan: Continue treatment per original radiation prescription

## 2013-10-21 NOTE — Progress Notes (Signed)
[name] here in a wheelchair for weekly under treat visit.  She has had 2 fractions to her pelivs/left inguinal area.  She denies pain but has had occasional twinges of pain in her left groin area.  She says it feels like someone is pinching her.  She denies fatigue, diarrhea and nausea.  She does have urinary frequency due to her lasix.  She was given the Radiation Therapy and you book and discussed potential side effects of treatment including diarrhea, nausea, skin changes, bladder changes and hair loss. She was oriented to the clinic and was educated about under treat day with Dr. Roselind Messier on Tuesdays.  She was advised to call with any questions or concerns.

## 2013-10-22 ENCOUNTER — Ambulatory Visit
Admission: RE | Admit: 2013-10-22 | Discharge: 2013-10-22 | Disposition: A | Discharge status: Home / Self Care | Payer: Medicare Other | Source: Ambulatory Visit | Attending: Radiation Oncology | Admitting: Radiation Oncology

## 2013-10-23 ENCOUNTER — Ambulatory Visit
Admission: RE | Admit: 2013-10-23 | Discharge: 2013-10-23 | Disposition: A | Discharge status: Home / Self Care | Payer: Medicare Other | Source: Ambulatory Visit | Attending: Radiation Oncology | Admitting: Radiation Oncology

## 2013-10-24 ENCOUNTER — Ambulatory Visit
Admission: RE | Admit: 2013-10-24 | Discharge: 2013-10-24 | Disposition: A | Discharge status: Home / Self Care | Payer: Medicare Other | Source: Ambulatory Visit | Attending: Radiation Oncology | Admitting: Radiation Oncology

## 2013-10-27 ENCOUNTER — Ambulatory Visit
Admission: RE | Admit: 2013-10-27 | Discharge: 2013-10-27 | Disposition: A | Discharge status: Home / Self Care | Payer: Medicare Other | Source: Ambulatory Visit | Attending: Radiation Oncology | Admitting: Radiation Oncology

## 2013-10-28 ENCOUNTER — Ambulatory Visit
Admission: RE | Admit: 2013-10-28 | Discharge: 2013-10-28 | Disposition: A | Discharge status: Home / Self Care | Payer: Medicare Other | Source: Ambulatory Visit | Attending: Radiation Oncology | Admitting: Radiation Oncology

## 2013-10-28 ENCOUNTER — Encounter: Payer: Self-pay | Admitting: Radiation Oncology

## 2013-10-28 VITALS — BP 152/89 | HR 102 | Resp 18 | Wt 148.4 lb

## 2013-10-28 DIAGNOSIS — C519 Malignant neoplasm of vulva, unspecified: Secondary | ICD-10-CM

## 2013-10-28 NOTE — Progress Notes (Signed)
[facility]    Radiation Oncology [address] [place]     [name], M.D. [place], [place] [address]               [name], M.D., Ph.D. Phone: [phone]      [name] A. [name], M.D. Fax: [phone]      [name], M.D., Ph.D.         [name], M.D.         [name], M.D Weekly Treatment Management Note  Name: [name]     MRN: [id number]        CSN: [id number] Date: 10/28/2013      DOB: 12-11-1926  CC: [name], MD         [name]    Status: Outpatient  Diagnosis: The encounter diagnosis was Vulva cancer.  Current Dose: 14 Gy  Current Fraction: 7  Planned Dose: 60 Gy  Narrative: [name] was seen today for weekly treatment management. The chart was checked and MVCT  were reviewed. She has noticed slightly more burning with urination but no other problems at this time.  Review of patient's allergies indicates no known allergies. Current Outpatient Prescriptions  Medication Sig Dispense Refill  . atorvastatin (LIPITOR) 20 MG tablet Take 1 tablet (20 mg total) by mouth at bedtime.  30 tablet  6  . diltiazem (CARDIZEM CD) 360 MG 24 hr capsule Take 180 mg by mouth daily.       . furosemide (LASIX) 40 MG tablet Take 20 mg by mouth daily.       [name] glucose blood (ONE TOUCH TEST STRIPS) test strip Use as instructed to check blood sugars 4 times per day dx code 250.00  150 each  12  . insulin detemir (LEVEMIR) 100 UNIT/ML injection Inject 24 Units into the skin at bedtime. 24 units in am and 20 units in the pm uses flexpen      . insulin lispro (HUMALOG) 100 UNIT/ML SOPN Inject 14-28 Units into the skin 3 (three) times daily with meals. 16 units at breakfast, 22 units at lunch and 28 units at supper  6 pen  0  . KLOR-CON M10 10 MEQ tablet Take 10 mEq by mouth daily.       [name] levothyroxine (SYNTHROID, LEVOTHROID) 100 MCG tablet Take 100 mcg by mouth every morning.       . metoprolol succinate (TOPROL-XL) 25 MG 24 hr tablet Take 25 mg  by mouth 2 (two) times daily.       [name] warfarin (COUMADIN) 5 MG tablet Take 2.5-5 mg by mouth daily. Takes 5mg  daily except 2.5mg  on Monday, Wednesdays andFridays       No current facility-administered medications for this encounter.   Physical Examination:  weight is 148 lb 6.4 oz (67.314 kg). Her blood pressure is 152/89 and her pulse is 102. Her respiration is 18.    Wt Readings from Last 3 Encounters:  10/28/13 148 lb 6.4 oz (67.314 kg)  10/21/13 149 lb 12.8 oz (67.949 kg)  09/29/13 150 lb 14.4 oz (68.448 kg)     Lungs - Normal respiratory effort, chest expands symmetrically. Lungs are clear to auscultation, no crackles or wheezes.  Heart has regular rhythm and rate  Abdomen is soft and non tender with normal bowel sounds  Assessment:  Patient tolerating treatments well  Plan: Continue treatment per original radiation prescription

## 2013-10-28 NOTE — Progress Notes (Signed)
Reports occasional perineal burning worse with urination. Reports her perineal area remains sore. Denies skin changes of perineal area. Denies pain at this time. Provided patient with EPIC calendar and review details.

## 2013-10-29 ENCOUNTER — Ambulatory Visit
Admission: RE | Admit: 2013-10-29 | Discharge: 2013-10-29 | Disposition: A | Discharge status: Home / Self Care | Payer: Medicare Other | Source: Ambulatory Visit | Attending: Radiation Oncology | Admitting: Radiation Oncology

## 2013-10-30 ENCOUNTER — Ambulatory Visit
Admission: RE | Admit: 2013-10-30 | Discharge: 2013-10-30 | Disposition: A | Discharge status: Home / Self Care | Payer: Medicare Other | Source: Ambulatory Visit | Attending: Radiation Oncology | Admitting: Radiation Oncology

## 2013-10-31 ENCOUNTER — Ambulatory Visit
Admission: RE | Admit: 2013-10-31 | Discharge: 2013-10-31 | Disposition: A | Discharge status: Home / Self Care | Payer: Medicare Other | Source: Ambulatory Visit | Attending: Radiation Oncology | Admitting: Radiation Oncology

## 2013-11-01 ENCOUNTER — Encounter: Payer: Self-pay | Admitting: Cardiology

## 2013-11-01 DIAGNOSIS — I4891 Unspecified atrial fibrillation: Secondary | ICD-10-CM

## 2013-11-01 DIAGNOSIS — E785 Hyperlipidemia, unspecified: Secondary | ICD-10-CM

## 2013-11-01 DIAGNOSIS — E669 Obesity, unspecified: Secondary | ICD-10-CM

## 2013-11-03 ENCOUNTER — Ambulatory Visit
Admission: RE | Admit: 2013-11-03 | Discharge: 2013-11-03 | Disposition: A | Discharge status: Home / Self Care | Payer: Medicare Other | Source: Ambulatory Visit | Attending: Radiation Oncology | Admitting: Radiation Oncology

## 2013-11-04 ENCOUNTER — Ambulatory Visit
Admission: RE | Admit: 2013-11-04 | Discharge: 2013-11-04 | Disposition: A | Discharge status: Home / Self Care | Payer: Medicare Other | Source: Ambulatory Visit | Attending: Radiation Oncology | Admitting: Radiation Oncology

## 2013-11-04 ENCOUNTER — Ambulatory Visit: Payer: Medicare Other | Admitting: Cardiology

## 2013-11-04 VITALS — BP 131/57 | HR 96 | Temp 98.4°F | Ht 64.0 in | Wt 153.1 lb

## 2013-11-04 DIAGNOSIS — C519 Malignant neoplasm of vulva, unspecified: Secondary | ICD-10-CM

## 2013-11-04 NOTE — Progress Notes (Addendum)
[name] here for weekly under treat visit.  She has had 12 fractions to her pelvis.  She is having pain/burning in her rectal area.  She is wondering if she can get cream for it.  She said the burning started over the weekend.  She has a donut that has been relieving the pressure.  She reports an increase in the amount of bowel movements she has a day.  She reports 4 bowel movements a day.  She denies fatigue.  She denies vaginal or rectal bleeding.  On inspection, the rectal area is red with an irritated area about her rectum.  Sitz bath given and instructed patient on how to use it 2-3 times per day.

## 2013-11-04 NOTE — Progress Notes (Signed)
[facility]    Radiation Oncology [address] [place]     [name], M.D. [place], [place] [address]               [name], M.D., Ph.D. Phone: [phone]      [name] A. [name], M.D. Fax: [phone]      [name], M.D., Ph.D.         [name], M.D.         [name], M.D Weekly Treatment Management Note  Name: [name]     MRN: [id number]        CSN: [id number] Date: 11/04/2013      DOB: 07-Feb-1926  CC: Lindsay Littler, MD         [name]    Status: Outpatient  Diagnosis: The encounter diagnosis was Vulvar cancer, carcinoma.  Current Dose: 24 Gy  Current Fraction: 12  Planned Dose: 60 Gy  Narrative: [name] was seen today for weekly treatment management. The chart was checked and MVCT  were reviewed. She is starting to have some soreness in the perirectal area. I recommended she try hemorrhoidal cream. She is using "baby wipes" at this time.  We have also recommended a sitz bath.  Patient does have some medication from her prior surgery which she was using in this area and will bring this medication in for review tomorrow.  Tenex and Crestor Current Outpatient Prescriptions  Medication Sig Dispense Refill  . atorvastatin (LIPITOR) 20 MG tablet Take 1 tablet (20 mg total) by mouth at bedtime.  30 tablet  6  . diltiazem (CARDIZEM CD) 360 MG 24 hr capsule Take 180 mg by mouth daily.       . furosemide (LASIX) 40 MG tablet Take 20 mg by mouth daily.       [name] glucose blood (ONE TOUCH TEST STRIPS) test strip Use as instructed to check blood sugars 4 times per day dx code 250.00  150 each  12  . insulin aspart (NOVOLOG FLEXPEN) 100 UNIT/ML SOPN FlexPen Inject 16 Units into the skin as directed. 16 units before breakfast and 22 at lunch and 28 units before supper      . insulin detemir (LEVEMIR) 100 UNIT/ML injection Inject 24 Units into the skin at bedtime. 14 in am and 20 in pm      . KLOR-CON M10 10 MEQ tablet Take 10 mEq by mouth  daily.       [name] levothyroxine (SYNTHROID, LEVOTHROID) 100 MCG tablet Take 100 mcg by mouth every morning.       . metoprolol succinate (TOPROL-XL) 25 MG 24 hr tablet Take 25 mg by mouth 2 (two) times daily.       [name] warfarin (COUMADIN) 5 MG tablet Take 2.5-5 mg by mouth daily. Takes 5mg  daily except 2.5mg  on Monday, Wednesdays andFridays       No current facility-administered medications for this encounter.   Physical Examination:  height is 5\' 4"  (1.626 m) and weight is 153 lb 1.6 oz (69.446 kg). Her temperature is 98.4 F (36.9 C). Her blood pressure is 131/57 and her pulse is 96.    Wt Readings from Last 3 Encounters:  11/04/13 153 lb 1.6 oz (69.446 kg)  10/28/13 148 lb 6.4 oz (67.314 kg)  10/21/13 149 lb 12.8 oz (67.949 kg)     Lungs - Normal respiratory effort, chest expands symmetrically. Lungs are clear to auscultation, no crackles or wheezes.  Heart has regular  rhythm and rate  Abdomen is soft and non tender with normal bowel sounds  Assessment:  Patient tolerating treatments well  Plan: Continue treatment per original radiation prescription

## 2013-11-05 ENCOUNTER — Ambulatory Visit
Admission: RE | Admit: 2013-11-05 | Discharge: 2013-11-05 | Disposition: A | Discharge status: Home / Self Care | Payer: Medicare Other | Source: Ambulatory Visit | Attending: Radiation Oncology | Admitting: Radiation Oncology

## 2013-11-05 ENCOUNTER — Telehealth: Payer: Self-pay | Admitting: Oncology

## 2013-11-05 NOTE — Telephone Encounter (Addendum)
Called [name] to let her know to only use silvadene cream, not aquaphor per Dr. Roselind Messier.

## 2013-11-06 ENCOUNTER — Ambulatory Visit
Admission: RE | Admit: 2013-11-06 | Discharge: 2013-11-06 | Disposition: A | Discharge status: Home / Self Care | Payer: Medicare Other | Source: Ambulatory Visit | Attending: Radiation Oncology | Admitting: Radiation Oncology

## 2013-11-06 NOTE — Telephone Encounter (Signed)
completed

## 2013-11-07 ENCOUNTER — Ambulatory Visit
Admission: RE | Admit: 2013-11-07 | Discharge: 2013-11-07 | Disposition: A | Discharge status: Home / Self Care | Payer: Medicare Other | Source: Ambulatory Visit | Attending: Radiation Oncology | Admitting: Radiation Oncology

## 2013-11-10 ENCOUNTER — Ambulatory Visit
Admission: RE | Admit: 2013-11-10 | Discharge: 2013-11-10 | Disposition: A | Discharge status: Home / Self Care | Payer: Medicare Other | Source: Ambulatory Visit | Attending: Radiation Oncology | Admitting: Radiation Oncology

## 2013-11-10 ENCOUNTER — Other Ambulatory Visit: Payer: Self-pay | Admitting: *Deleted

## 2013-11-10 MED ORDER — POTASSIUM CHLORIDE CRYS ER 10 MEQ PO TBCR: 10.0000 meq | EXTENDED_RELEASE_TABLET | Freq: Every day | ORAL | Status: DC

## 2013-11-11 ENCOUNTER — Ambulatory Visit
Admission: RE | Admit: 2013-11-11 | Discharge: 2013-11-11 | Disposition: A | Discharge status: Home / Self Care | Payer: Medicare Other | Source: Ambulatory Visit | Attending: Radiation Oncology | Admitting: Radiation Oncology

## 2013-11-11 ENCOUNTER — Ambulatory Visit (INDEPENDENT_AMBULATORY_CARE_PROVIDER_SITE_OTHER): Payer: Medicare Other | Admitting: *Deleted

## 2013-11-11 ENCOUNTER — Encounter: Payer: Self-pay | Admitting: Radiation Oncology

## 2013-11-11 VITALS — BP 132/63 | HR 85 | Temp 97.5°F | Resp 20 | Wt 152.6 lb

## 2013-11-11 DIAGNOSIS — I4891 Unspecified atrial fibrillation: Secondary | ICD-10-CM

## 2013-11-11 DIAGNOSIS — I6350 Cerebral infarction due to unspecified occlusion or stenosis of unspecified cerebral artery: Secondary | ICD-10-CM

## 2013-11-11 DIAGNOSIS — C519 Malignant neoplasm of vulva, unspecified: Secondary | ICD-10-CM

## 2013-11-11 DIAGNOSIS — I639 Cerebral infarction, unspecified: Secondary | ICD-10-CM

## 2013-11-11 NOTE — Progress Notes (Signed)
Patient rad txs pelvis, 17/30 completd, regular bowel movements, nausea on Friday and threw up after rad txs, none resolved, soreness on bottom stated, uses baby wipes, eating okay, ftigued 10:30 AM

## 2013-11-12 ENCOUNTER — Ambulatory Visit: Payer: Medicare Other | Admitting: Gynecology

## 2013-11-12 ENCOUNTER — Ambulatory Visit
Admission: RE | Admit: 2013-11-12 | Discharge: 2013-11-12 | Disposition: A | Discharge status: Home / Self Care | Payer: Medicare Other | Source: Ambulatory Visit | Attending: Radiation Oncology | Admitting: Radiation Oncology

## 2013-11-12 ENCOUNTER — Ambulatory Visit (INDEPENDENT_AMBULATORY_CARE_PROVIDER_SITE_OTHER): Payer: Medicare Other | Admitting: Cardiology

## 2013-11-12 ENCOUNTER — Encounter: Payer: Self-pay | Admitting: Cardiology

## 2013-11-12 VITALS — BP 120/80 | HR 96 | Ht 64.0 in | Wt 147.0 lb

## 2013-11-12 DIAGNOSIS — C519 Malignant neoplasm of vulva, unspecified: Secondary | ICD-10-CM

## 2013-11-12 DIAGNOSIS — E1129 Type 2 diabetes mellitus with other diabetic kidney complication: Secondary | ICD-10-CM

## 2013-11-12 DIAGNOSIS — I4891 Unspecified atrial fibrillation: Secondary | ICD-10-CM

## 2013-11-12 DIAGNOSIS — I10 Essential (primary) hypertension: Secondary | ICD-10-CM

## 2013-11-12 DIAGNOSIS — E785 Hyperlipidemia, unspecified: Secondary | ICD-10-CM

## 2013-11-12 DIAGNOSIS — E1122 Type 2 diabetes mellitus with diabetic chronic kidney disease: Secondary | ICD-10-CM

## 2013-11-12 NOTE — Patient Instructions (Addendum)
Your physician wants you to follow-up in: 6 months with DR. Skains You will receive a reminder letter in the mail two months in advance. If you don't receive a letter, please call our office to schedule the follow-up appointment.  Your physician recommends that you continue on your current medications as directed. Please refer to the Current Medication list given to you today.  

## 2013-11-12 NOTE — Progress Notes (Signed)
[address] [place], [place]  [address] Phone: [phone] Fax:  [phone]  Date:  11/12/2013   ID:  [name], DOB 23-Mar-1926, MRN [id number]  PCP:  [name], MD   History of Present Illness: [name] is a 79 y.o. female atrial fibrillation, chronic anticoagulation, evaluation as well as anticoagulation visit who has had in the past ongoing shortness of breath. In regards to her atrial fibrillation, her heart rate is slightly increased today. He is taking 2 tablets of 25 mg metoprolol succinate, for a total of 50 currently. I have increased it to 75 on 06/25/13.  Her dyspnea has improved with low-dose Lasix although she remains chronically short of breath with chronic fatigue with any amounts of activity. She states that she had been on an inhaler during her hospitalization previously. Prior BNP was mildly elevated and 400 range. She has chronic kidney disease as well. She does not describe any chest discomfort. She is continuing to have fatigue/dyspnea. Resting seems to help.   She has persistent atrial fibrillation as well as primary pulmonary disorder as described above. Dr. [name] has been following her. She has a history of stroke, therefore mandatory anticoagulation. She also has other comorbidities such as diabetes.  Having XRT for recurrent vulvar cancer.    Wt Readings from Last 3 Encounters:  11/12/13 147 lb (66.679 kg)  11/11/13 152 lb 9.6 oz (69.219 kg)  11/04/13 153 lb 1.6 oz (69.446 kg)     Past Medical History  Diagnosis Date  . Diabetes mellitus   . Hypertension   . Hyperlipidemia   . Obesity   . Hypothyroidism   . Vulvar cancer, carcinoma 11/03/2011  . Stroke 05/2000    right brain CVA pre H&P 2001  . Atrial fibrillation   . Cancer 2007    vulva-invasive well diff squamous cell ca  . CVA (cerebral infarction)   . Dyslipidemia     Past Surgical History  Procedure Laterality Date  . Vulvar lesion removal  2012  . Breast  surgery      Lumpectomy  . Dilation and curettage of uterus    . Cystourethroplasty / ureteroneocystostomy    . Radical vulvectomy    . Lymphadenectomy  2007    R inguinal femoral  . Radical vulvectomy  02/27/12  . Vulvar lesion removal  02/27/2012    Procedure: VULVAR LESION;  Surgeon: Jeannette Corpus, MD;  Location: [facility];  Service: Gynecology;  Laterality: N/A;  . Right carotid endarterctomy    . Left eye surgery      , permanently stitched eye together    Current Outpatient Prescriptions  Medication Sig Dispense Refill  . atorvastatin (LIPITOR) 20 MG tablet Take 1 tablet (20 mg total) by mouth at bedtime.  30 tablet  6  . diltiazem (CARDIZEM CD) 360 MG 24 hr capsule Take 180 mg by mouth daily.       . furosemide (LASIX) 40 MG tablet Take 20 mg by mouth daily.       . insulin aspart (NOVOLOG FLEXPEN) 100 UNIT/ML SOPN FlexPen Inject 16 Units into the skin as directed. 16 units before breakfast and 22 at lunch and 28 units before supper      . insulin detemir (LEVEMIR) 100 UNIT/ML injection Inject 24 Units into the skin at bedtime. 14 in am and 20 in pm      . levothyroxine (SYNTHROID, LEVOTHROID) 100 MCG tablet Take 100 mcg by mouth  every morning.       . metoprolol succinate (TOPROL-XL) 25 MG 24 hr tablet Take 25 mg by mouth 2 (two) times daily.       . potassium chloride (KLOR-CON M10) 10 MEQ tablet Take 1 tablet (10 mEq total) by mouth daily.  30 tablet  5  . silver sulfADIAZINE (SILVADENE) 1 % cream Apply 1 application topically daily.      [name] warfarin (COUMADIN) 5 MG tablet Take 2.5-5 mg by mouth daily. Takes 5mg  daily except 2.5mg  on Monday, Wednesdays andFridays      . glucose blood (ONE TOUCH TEST STRIPS) test strip Use as instructed to check blood sugars 4 times per day dx code 250.00  150 each  12   No current facility-administered medications for this visit.    Allergies:    Allergies  Allergen Reactions  . Tenex [Guanfacine Hcl] Other (See Comments)     hypotension  . Crestor [Rosuvastatin] Other (See Comments)    Myalgia    Social History:  The patient  reports that she quit smoking about 46 years ago. Her smoking use included Cigarettes. She has a 4.5 pack-year smoking history. She does not have any smokeless tobacco history on file. She reports that she does not drink alcohol or use illicit drugs.   ROS:  Please see the history of present illness.   Denies any syncope, bleeding, orthopnea, PND  PHYSICAL EXAM: VS:  BP 120/80  Pulse 96  Ht 5\' 4"  (1.626 m)  Wt 147 lb (66.679 kg)  BMI 25.22 kg/m2 Elderly, somewhat frail HEENT: Left eye slightly shut Neck: no JVD Cardiac:  Irregularly irregular, normal rate; no murmur Lungs:  Moderately reduced breath sounds bilaterally, mildly increased respiratory effort at baseline ,no wheezing, rhonchi or rales Abd: soft, nontender, no hepatomegaly Ext: no edema Skin: warm and dry Neuro: no focal abnormalities noted  EKG:  Heart rate 92, atrial fibrillation, nonspecific ST segment changes     ASSESSMENT AND PLAN:  1. Permanent atrial fibrillation-currently doing well with rate control. No changes made to medications 2. Chronic anticoagulation-stroke prevention. [employer] [place].D. adjusting Coumadin. 3. Vulvar cancer-unfortunately recurrence, XRT currently. 4. Hypertension-excellent control 5. Hyperlipidemia-continue with low-dose statin. Doing very well. 6. Dyspnea-continue with low-dose Lasix. Pulmonary exercises  Signed, Donato Schultz, MD [facility]  11/12/2013 11:36 AM

## 2013-11-13 ENCOUNTER — Ambulatory Visit
Admission: RE | Admit: 2013-11-13 | Discharge: 2013-11-13 | Disposition: A | Discharge status: Home / Self Care | Payer: Medicare Other | Source: Ambulatory Visit | Attending: Radiation Oncology | Admitting: Radiation Oncology

## 2013-11-13 VITALS — BP 121/86 | HR 110 | Temp 97.4°F | Ht 64.0 in | Wt 149.9 lb

## 2013-11-13 DIAGNOSIS — C519 Malignant neoplasm of vulva, unspecified: Secondary | ICD-10-CM

## 2013-11-13 NOTE — Progress Notes (Signed)
[facility]    Radiation Oncology [address] [place]     [name], M.D. [place], [place] [address]               [name], M.D., Ph.D. Phone: [phone]      [name] A. [name], M.D. Fax: [phone]      [name], M.D., Ph.D.         [name], M.D.         [name], M.D Weekly Treatment Management Note  Name: [name]     MRN: [id number]        CSN: [id number] Date: 11/13/2013      DOB: 1926-03-19  CC: Lindsay Littler, MD         Kumar    Status: Outpatient  Diagnosis: Recurrent vulvar carcinoma  Current Dose: 38 Gy  Current Fraction: 19  Planned Dose: 60 Gy  Narrative: [name] was seen today for weekly treatment management. The chart was checked and MVCT  were reviewed. She does have some soreness in the treatment area but no bleeding. She continues to use baby wipes and Silvadene. She denies any dysuria. She did have some nausea after treatment Friday but none since. She has chronic fatigue likely related to her atrial fibrillation.  Tenex and Crestor Current Outpatient Prescriptions  Medication Sig Dispense Refill  . atorvastatin (LIPITOR) 20 MG tablet Take 1 tablet (20 mg total) by mouth at bedtime.  30 tablet  6  . diltiazem (CARDIZEM CD) 360 MG 24 hr capsule Take 180 mg by mouth daily.       . furosemide (LASIX) 40 MG tablet Take 20 mg by mouth daily.       [name] glucose blood (ONE TOUCH TEST STRIPS) test strip Use as instructed to check blood sugars 4 times per day dx code 250.00  150 each  12  . insulin aspart (NOVOLOG FLEXPEN) 100 UNIT/ML SOPN FlexPen Inject 16 Units into the skin as directed. 16 units before breakfast and 22 at lunch and 28 units before supper      . insulin detemir (LEVEMIR) 100 UNIT/ML injection Inject 24 Units into the skin at bedtime. 14 in am and 20 in pm      . levothyroxine (SYNTHROID, LEVOTHROID) 100 MCG tablet Take 100 mcg by mouth every morning.       . metoprolol succinate (TOPROL-XL) 25 MG  24 hr tablet Take 25 mg by mouth 2 (two) times daily.       . potassium chloride (KLOR-CON M10) 10 MEQ tablet Take 1 tablet (10 mEq total) by mouth daily.  30 tablet  5  . silver sulfADIAZINE (SILVADENE) 1 % cream Apply 1 application topically daily.      [name] warfarin (COUMADIN) 5 MG tablet Take 2.5-5 mg by mouth daily. Takes 5mg  daily except 2.5mg  on Monday, Wednesdays andFridays       No current facility-administered medications for this encounter.    Physical Examination:  Filed Vitals:   11/13/13 1033  BP: 121/86  Pulse: 110  Temp: 97.4 F (36.3 C)    Wt Readings from Last 3 Encounters:  11/13/13 149 lb 14.4 oz (67.994 kg)  11/12/13 147 lb (66.679 kg)  11/11/13 152 lb 9.6 oz (69.219 kg)     Lungs - Normal respiratory effort, chest expands symmetrically. Lungs are clear to auscultation, no crackles or wheezes.  Heart has irregular rhythm consistent with atrial fibrillation Abdomen is soft and non tender  with normal bowel sounds  Assessment:  Patient tolerating treatments well except for above issues  Plan: Continue treatment per original radiation prescription

## 2013-11-13 NOTE — Progress Notes (Signed)
Patient rad txs pelvis, 19/30 completd, regular bowel movements, nausea on Friday and threw up after rad txs, none resolved, soreness on bottom stated, uses baby wipes, and silvadene.  Reports that her skin is peeling in her rectal area.  eating okay, fatigued.

## 2013-11-14 ENCOUNTER — Ambulatory Visit
Admission: RE | Admit: 2013-11-14 | Discharge: 2013-11-14 | Disposition: A | Discharge status: Home / Self Care | Payer: Medicare Other | Source: Ambulatory Visit | Attending: Radiation Oncology | Admitting: Radiation Oncology

## 2013-11-16 ENCOUNTER — Ambulatory Visit
Admission: RE | Admit: 2013-11-16 | Discharge: 2013-11-16 | Disposition: A | Discharge status: Home / Self Care | Payer: Medicare Other | Source: Ambulatory Visit | Attending: Radiation Oncology | Admitting: Radiation Oncology

## 2013-11-17 ENCOUNTER — Ambulatory Visit
Admission: RE | Admit: 2013-11-17 | Discharge: 2013-11-17 | Disposition: A | Discharge status: Home / Self Care | Payer: Medicare Other | Source: Ambulatory Visit | Attending: Radiation Oncology | Admitting: Radiation Oncology

## 2013-11-17 VITALS — BP 131/90 | HR 94 | Temp 97.5°F

## 2013-11-17 DIAGNOSIS — C519 Malignant neoplasm of vulva, unspecified: Secondary | ICD-10-CM

## 2013-11-17 MED ORDER — HYDROCODONE-ACETAMINOPHEN 5-325 MG PO TABS: 1.0000 | ORAL_TABLET | Freq: Four times a day (QID) | ORAL | Status: DC | PRN

## 2013-11-17 NOTE — Progress Notes (Signed)
[facility]    Radiation Oncology [address] [place]     [name], M.D. [place], [place] [address]               [name], M.D., Ph.D. Phone: [phone]      [name] A. [name], M.D. Fax: [phone]      [name], M.D., Ph.D.         [name], M.D.         [name], M.D Weekly Treatment Management Note  Name: [name]     MRN: [id number]        CSN: [id number] Date: 11/17/2013      DOB: January 01, 1926  CC: Lindsay Littler, MD         [name]    Status: Outpatient  Diagnosis: Recurrent vulvar carcinoma  Current Dose: 44 gy  Current Fraction: 22  Planned Dose: 60 Gy  Narrative: [name] was seen today for weekly treatment management. The chart was checked and MVCT  were reviewed. She is having significant discomfort in the perineum area particularly when sitting down. She is comfortable when lying flat and is able to sleep at night. She continues to use Silvadene but only once a day. She does notice occasional blood on tissue after using baby wipes.  Tenex and Crestor Current Outpatient Prescriptions  Medication Sig Dispense Refill  . atorvastatin (LIPITOR) 20 MG tablet Take 1 tablet (20 mg total) by mouth at bedtime.  30 tablet  6  . diltiazem (CARDIZEM CD) 360 MG 24 hr capsule Take 180 mg by mouth daily.       . furosemide (LASIX) 40 MG tablet Take 20 mg by mouth daily.       [name] glucose blood (ONE TOUCH TEST STRIPS) test strip Use as instructed to check blood sugars 4 times per day dx code 250.00  150 each  12  . insulin aspart (NOVOLOG FLEXPEN) 100 UNIT/ML SOPN FlexPen Inject 16 Units into the skin as directed. 16 units before breakfast and 22 at lunch and 28 units before supper      . insulin detemir (LEVEMIR) 100 UNIT/ML injection Inject 24 Units into the skin at bedtime. 14 in am and 20 in pm      . levothyroxine (SYNTHROID, LEVOTHROID) 100 MCG tablet Take 100 mcg by mouth every morning.       . metoprolol succinate (TOPROL-XL)  25 MG 24 hr tablet Take 25 mg by mouth 2 (two) times daily.       . potassium chloride (KLOR-CON M10) 10 MEQ tablet Take 1 tablet (10 mEq total) by mouth daily.  30 tablet  5  . silver sulfADIAZINE (SILVADENE) 1 % cream Apply 1 application topically daily.      [name] warfarin (COUMADIN) 5 MG tablet Take 2.5-5 mg by mouth daily. Takes 5mg  daily except 2.5mg  on Monday, Wednesdays andFridays      . HYDROcodone-acetaminophen (NORCO/VICODIN) 5-325 MG per tablet Take 1 tablet by mouth every 6 (six) hours as needed for moderate pain.  30 tablet  0   No current facility-administered medications for this encounter.   Labs:  Lab Results  Component Value Date   WBC 7.8 09/24/2013   HGB 13.0 09/24/2013   HCT 39.4 09/24/2013   MCV 98.3 09/24/2013   PLT 157.0 09/24/2013   Lab Results  Component Value Date   CREATININE 1.6* 09/24/2013   BUN 24* 09/24/2013   NA 138 09/24/2013   K 4.5  09/24/2013   CL 104 09/24/2013   CO2 24 09/24/2013   Lab Results  Component Value Date   ALT 16 07/29/2013   AST 20 07/29/2013   PHOS 2.8 09/20/2011   BILITOT 1.1 07/29/2013    Physical Examination:  Filed Vitals:   11/17/13 1015  BP: 131/90  Pulse: 94  Temp: 97.5 F (36.4 C)    Wt Readings from Last 3 Encounters:  11/13/13 149 lb 14.4 oz (67.994 kg)  11/12/13 147 lb (66.679 kg)  11/11/13 152 lb 9.6 oz (69.219 kg)   The perineum and perirectal area shows moist desquamation without obvious infection.   Assessment:  Patient is having expected side effects with her treatments. We have recommended she use her Silvadene twice daily and strongly recommended she begin using  sitz baths.  The patient does not wish to have a break in her treatment but will have a break over the long Thanksgiving weekend.  The patient has been given Vicodin for her pain issues. I also recommended she consider using Tylenol  Plan: Continue treatment per original radiation prescription

## 2013-11-17 NOTE — Progress Notes (Addendum)
[name] here with her husband for under treat visit.  She has had 22 fractions to her pelvis.  She is having pain today in her rectal, vaginal area that she is rating at a 10/10 when she sits down.  She states that she has been spending most of her time in bed to stay off of it.  She is using silvadene cream once a day.  She is wondering if she get something for the pain.  She reports that she is having 3-4 soft stools per day.  Her rectal area is red and bleeding.  Her vaginal/pelvic area is also red.  Late entry:  Advised Harpreet to use her sitz bath with warm water 2-3 times a day.  Her husband is going to get it ready for her.  Also advised her that she can use the Silvadene cream three times a day.  Advised her not to apply it before treatment.

## 2013-11-18 ENCOUNTER — Ambulatory Visit
Admission: RE | Admit: 2013-11-18 | Discharge: 2013-11-18 | Disposition: A | Discharge status: Home / Self Care | Payer: Medicare Other | Source: Ambulatory Visit | Attending: Radiation Oncology | Admitting: Radiation Oncology

## 2013-11-19 ENCOUNTER — Ambulatory Visit
Admission: RE | Admit: 2013-11-19 | Discharge: 2013-11-19 | Disposition: A | Discharge status: Home / Self Care | Payer: Medicare Other | Source: Ambulatory Visit | Attending: Radiation Oncology | Admitting: Radiation Oncology

## 2013-11-22 ENCOUNTER — Other Ambulatory Visit: Payer: Self-pay | Admitting: Endocrinology

## 2013-11-24 ENCOUNTER — Other Ambulatory Visit (INDEPENDENT_AMBULATORY_CARE_PROVIDER_SITE_OTHER): Payer: Medicare Other

## 2013-11-24 ENCOUNTER — Ambulatory Visit
Admission: RE | Admit: 2013-11-24 | Discharge: 2013-11-24 | Disposition: A | Discharge status: Home / Self Care | Payer: Medicare Other | Source: Ambulatory Visit | Attending: Radiation Oncology | Admitting: Radiation Oncology

## 2013-11-24 DIAGNOSIS — IMO0001 Reserved for inherently not codable concepts without codable children: Secondary | ICD-10-CM

## 2013-11-24 LAB — URINALYSIS, ROUTINE W REFLEX MICROSCOPIC
Bilirubin Urine: NEGATIVE
Nitrite: NEGATIVE
Specific Gravity, Urine: 1.01 (ref 1.000–1.030)
Total Protein, Urine: NEGATIVE
Urine Glucose: NEGATIVE
pH: 6 (ref 5.0–8.0)

## 2013-11-24 LAB — LIPID PANEL
Cholesterol: 110 mg/dL (ref 0–200)
HDL: 29.8 mg/dL — ABNORMAL LOW (ref >39.00)
Total CHOL/HDL Ratio: 4
Triglycerides: 130 mg/dL (ref 0.0–149.0)
VLDL: 26 mg/dL (ref 0.0–40.0)

## 2013-11-24 LAB — COMPREHENSIVE METABOLIC PANEL
ALT: 8 U/L (ref 0–35)
AST: 18 U/L (ref 0–37)
BUN: 25 mg/dL — ABNORMAL HIGH (ref 6–23)
CO2: 26 mEq/L (ref 19–32)
Calcium: 8.8 mg/dL (ref 8.4–10.5)
Chloride: 104 mEq/L (ref 96–112)
Creatinine, Ser: 1.6 mg/dL — ABNORMAL HIGH (ref 0.4–1.2)
GFR: 33.51 mL/min — ABNORMAL LOW (ref >60.00)
Glucose, Bld: 144 mg/dL — ABNORMAL HIGH (ref 70–99)

## 2013-11-24 LAB — HEMOGLOBIN A1C: Hgb A1c MFr Bld: 8.9 % — ABNORMAL HIGH (ref 4.6–6.5)

## 2013-11-25 ENCOUNTER — Ambulatory Visit (INDEPENDENT_AMBULATORY_CARE_PROVIDER_SITE_OTHER): Payer: Medicare Other | Admitting: *Deleted

## 2013-11-25 ENCOUNTER — Ambulatory Visit
Admission: RE | Admit: 2013-11-25 | Discharge: 2013-11-25 | Disposition: A | Discharge status: Home / Self Care | Payer: Medicare Other | Source: Ambulatory Visit | Attending: Radiation Oncology | Admitting: Radiation Oncology

## 2013-11-25 VITALS — BP 133/97 | HR 102 | Temp 97.6°F | Ht 64.0 in | Wt 149.8 lb

## 2013-11-25 DIAGNOSIS — I6350 Cerebral infarction due to unspecified occlusion or stenosis of unspecified cerebral artery: Secondary | ICD-10-CM

## 2013-11-25 DIAGNOSIS — I4891 Unspecified atrial fibrillation: Secondary | ICD-10-CM

## 2013-11-25 DIAGNOSIS — C519 Malignant neoplasm of vulva, unspecified: Secondary | ICD-10-CM

## 2013-11-25 DIAGNOSIS — I639 Cerebral infarction, unspecified: Secondary | ICD-10-CM

## 2013-11-25 LAB — POCT INR: INR: 2.6

## 2013-11-25 NOTE — Addendum Note (Signed)
Encounter addended by: [name], MD on: 11/25/2013 10:36 AM<BR>     Documentation filed: Follow-up Section, LOS Section, Notes Section, Visit Diagnoses

## 2013-11-25 NOTE — Progress Notes (Signed)
[name] here with her husband for weekly under treat visit.  She has had 26 fractions to her pelvis.  She rates her pain in her rectal/vaginal area sitting down at a 2/10.  When she moves around it is much worse.  She is using silvadene cream once a day.  Advised her that she can use it up to 3 times a day.  She is using her sitz bath but does not think it helps very much.  She is taking one Norco tablet per day witch does help.  She says the area has not been bleeding but feels wet.  She denies bladder frequency and urinary burning.  She denies diarrhea.  She denies fatigue.

## 2013-11-25 NOTE — Progress Notes (Signed)
[facility]    Radiation Oncology [address] [place]     [name], M.D. [place], [place] [address]               [name], M.D., Ph.D. Phone: [phone]      [name] A. [name], M.D. Fax: [phone]      [name], M.D., Ph.D.         [name], M.D.         [name], M.D Weekly Treatment Management Note  Name: [name]     MRN: [id number]        CSN: [id number] Date: 11/25/2013      DOB: Jul 23, 1926  CC: [name], MD         Kumar    Status: Outpatient  Diagnosis: Recurrent vulvar carcinoma  Current Dose: 52 Gy  Current Fraction: 26  Planned Dose: 60 Gy  Narrative: [name] was seen today for weekly treatment management. The chart was checked and MVCT  were reviewed. She is more comfortable this week. She is using Silvadene and sitz baths.  She takes 1 Norco per day.  Tenex and Crestor Current Outpatient Prescriptions  Medication Sig Dispense Refill  . atorvastatin (LIPITOR) 20 MG tablet Take 1 tablet (20 mg total) by mouth at bedtime.  30 tablet  6  . diltiazem (CARDIZEM CD) 360 MG 24 hr capsule Take 180 mg by mouth daily.       . furosemide (LASIX) 40 MG tablet Take 20 mg by mouth daily.       [name] glucose blood (ONE TOUCH TEST STRIPS) test strip Use as instructed to check blood sugars 4 times per day dx code 250.00  150 each  12  . insulin detemir (LEVEMIR) 100 UNIT/ML injection Inject 24 Units into the skin at bedtime. 14 in am and 20 in pm      . levothyroxine (SYNTHROID, LEVOTHROID) 100 MCG tablet Take 100 mcg by mouth every morning.       . metoprolol succinate (TOPROL-XL) 25 MG 24 hr tablet Take 25 mg by mouth 2 (two) times daily.       . potassium chloride (KLOR-CON M10) 10 MEQ tablet Take 1 tablet (10 mEq total) by mouth daily.  30 tablet  5  . silver sulfADIAZINE (SILVADENE) 1 % cream Apply 1 application topically daily.      [name] warfarin (COUMADIN) 5 MG tablet Take 2.5-5 mg by mouth daily. Takes 5mg  daily except  2.5mg  on Monday, Wednesdays andFridays      . HUMALOG KWIKPEN 100 UNIT/ML SOPN INJECT 14 TO 28 UNITS UNDER THE SKIN THREE TIMES A DAY WITH MEALS (16 UNITS AT BREAKFAST, 22 UNITS AT LUNCH, AND 28 UNITS AT SUPPER)  10 pen  4  . HYDROcodone-acetaminophen (NORCO/VICODIN) 5-325 MG per tablet Take 1 tablet by mouth every 6 (six) hours as needed for moderate pain.  30 tablet  0   No current facility-administered medications for this encounter.   Labs:  Lab Results  Component Value Date   WBC 7.8 09/24/2013   HGB 13.0 09/24/2013   HCT 39.4 09/24/2013   MCV 98.3 09/24/2013   PLT 157.0 09/24/2013   Lab Results  Component Value Date   CREATININE 1.6* 11/24/2013   BUN 25* 11/24/2013   NA 138 11/24/2013   K 4.2 11/24/2013   CL 104 11/24/2013   CO2 26 11/24/2013   Lab Results  Component Value Date   ALT 8  11/24/2013   AST 18 11/24/2013   PHOS 2.8 09/20/2011   BILITOT 0.9 11/24/2013    Physical Examination:  Filed Vitals:   11/17/13 1015  BP: 131/90  Pulse: 94  Temp: 97.5 F (36.4 C)    Wt Readings from Last 3 Encounters:  11/25/13 149 lb 12.8 oz (67.949 kg)  11/13/13 149 lb 14.4 oz (67.994 kg)  11/12/13 147 lb (66.679 kg)     Lungs - Normal respiratory effort, chest expands symmetrically. Lungs are clear to auscultation, no crackles or wheezes.  Heart has a regular rhythm consistent with atrial fibrillation  Abdomen is soft and non tender with normal bowel sounds  Assessment:  Patient tolerating treatments better this week  Plan: Continue treatment per original radiation prescription

## 2013-11-26 ENCOUNTER — Ambulatory Visit
Admission: RE | Admit: 2013-11-26 | Discharge: 2013-11-26 | Disposition: A | Discharge status: Home / Self Care | Payer: Medicare Other | Source: Ambulatory Visit | Attending: Radiation Oncology | Admitting: Radiation Oncology

## 2013-11-27 ENCOUNTER — Ambulatory Visit
Admission: RE | Admit: 2013-11-27 | Discharge: 2013-11-27 | Disposition: A | Discharge status: Home / Self Care | Payer: Medicare Other | Source: Ambulatory Visit | Attending: Radiation Oncology | Admitting: Radiation Oncology

## 2013-11-28 ENCOUNTER — Ambulatory Visit
Admission: RE | Admit: 2013-11-28 | Discharge: 2013-11-28 | Disposition: A | Discharge status: Home / Self Care | Payer: Medicare Other | Source: Ambulatory Visit | Attending: Radiation Oncology | Admitting: Radiation Oncology

## 2013-12-01 ENCOUNTER — Ambulatory Visit: Payer: Medicare Other | Admitting: Endocrinology

## 2013-12-01 ENCOUNTER — Ambulatory Visit: Payer: Medicare Other

## 2013-12-01 ENCOUNTER — Ambulatory Visit
Admission: RE | Admit: 2013-12-01 | Discharge: 2013-12-01 | Disposition: A | Discharge status: Home / Self Care | Payer: Medicare Other | Source: Ambulatory Visit | Attending: Radiation Oncology | Admitting: Radiation Oncology

## 2013-12-01 VITALS — BP 137/87 | HR 101 | Temp 97.5°F | Ht 64.0 in | Wt 148.9 lb

## 2013-12-01 DIAGNOSIS — C519 Malignant neoplasm of vulva, unspecified: Secondary | ICD-10-CM

## 2013-12-01 NOTE — Progress Notes (Signed)
[name] here with her husband for final treatment visit.  She has had 30 fractions to her pelvis/left inguinal.  She says the pain in her vaginal/rectal area is worse when she sits down for a while.  She says she usually takes 1 norco tablet per day.  Per her husband, she does not like to use the sitz bath.  She is using silvadene once a day.  She denies any bladder issues and diarrhea.  She notices occasional bleeding when wipping.  Her skin in her rectal area is red.  Both sides of her groin are red and peeling.  She is fatigued.  Her husband reports that she has been staying in bed for the past couple of days.

## 2013-12-01 NOTE — Progress Notes (Signed)
[name]    Radiation Oncology [address] [place]     [name], M.D. [place], [place] [address]               [name], M.D., Ph.D. Phone: [phone]      [name] A. [name], M.D. Fax: [phone]      [name], M.D., Ph.D.         [name], M.D.         [name], M.D Weekly Treatment Management Note  Name: [name]     MRN: [id number]        CSN: [id number] Date: 12/01/2013      DOB: [birth year]  CC: Lindsay Littler, MD         [name]    Status: Outpatient  Diagnosis: Recurrent vulvar carcinoma  Current Dose: 60 Gy  Current Fraction: 30  Planned Dose: 60 Gy  Narrative: [name] was seen today for weekly treatment management. The chart was checked and MVCT  were reviewed. She is happy to complete her radiation therapy today.  Overall her pain in the perineum has improved there is a past evaluation. She does lie in bed a lot during the day as this is most comfortable for her. She denies any bleeding from the area.  Tenex and Crestor Current Outpatient Prescriptions  Medication Sig Dispense Refill  . atorvastatin (LIPITOR) 20 MG tablet Take 1 tablet (20 mg total) by mouth at bedtime.  30 tablet  6  . diltiazem (CARDIZEM CD) 360 MG 24 hr capsule Take 180 mg by mouth daily.       . furosemide (LASIX) 40 MG tablet Take 20 mg by mouth daily.       [name] glucose blood (ONE TOUCH TEST STRIPS) test strip Use as instructed to check blood sugars 4 times per day dx code 250.00  150 each  12  . HUMALOG KWIKPEN 100 UNIT/ML SOPN INJECT 14 TO 28 UNITS UNDER THE SKIN THREE TIMES A DAY WITH MEALS (16 UNITS AT BREAKFAST, 22 UNITS AT LUNCH, AND 28 UNITS AT SUPPER)  10 pen  4  . HYDROcodone-acetaminophen (NORCO/VICODIN) 5-325 MG per tablet Take 1 tablet by mouth every 6 (six) hours as needed for moderate pain.  30 tablet  0  . insulin detemir (LEVEMIR) 100 UNIT/ML injection Inject 24 Units into the skin at bedtime. 14 in am and 20 in pm      .  levothyroxine (SYNTHROID, LEVOTHROID) 100 MCG tablet Take 100 mcg by mouth every morning.       . metoprolol succinate (TOPROL-XL) 25 MG 24 hr tablet Take 25 mg by mouth 2 (two) times daily.       . potassium chloride (KLOR-CON M10) 10 MEQ tablet Take 1 tablet (10 mEq total) by mouth daily.  30 tablet  5  . silver sulfADIAZINE (SILVADENE) 1 % cream Apply 1 application topically daily.      [name] warfarin (COUMADIN) 5 MG tablet Take 2.5-5 mg by mouth daily. Takes 5mg  daily except 2.5mg  on Monday, Wednesdays andFridays       No current facility-administered medications for this encounter.   Labs:  Physical Examination:  Filed Vitals:   12/01/13 1053  BP: 137/87  Pulse: 101  Temp: 97.5 F (36.4 C)    Wt Readings from Last 3 Encounters:  12/01/13 148 lb 14.4 oz (67.541 kg)  11/25/13 149 lb 12.8 oz (67.949 kg)  11/13/13 149 lb 14.4 oz (67.994 kg)  Lungs - Normal respiratory effort, chest expands symmetrically. Lungs are clear to auscultation, no crackles or wheezes.  Heart has regular rhythm and rate  Abdomen is soft and non tender with normal bowel sounds The perineum shows moist desquamation without obvious infection.  Assessment:  Patient tolerated treatments well except for aboves.   Plan: Followup in one week. Patient will be followed closely until she has had healing of her skin breakdown.   she will continue using sitz baths and Silvadene.  She is taking hydrocodone approximately once per day at this time.

## 2013-12-02 ENCOUNTER — Ambulatory Visit: Payer: Medicare Other

## 2013-12-03 ENCOUNTER — Encounter: Payer: Self-pay | Admitting: Endocrinology

## 2013-12-03 ENCOUNTER — Ambulatory Visit (INDEPENDENT_AMBULATORY_CARE_PROVIDER_SITE_OTHER): Payer: Medicare Other | Admitting: Endocrinology

## 2013-12-03 VITALS — BP 110/62 | HR 90 | Temp 98.3°F | Resp 14 | Ht 64.0 in | Wt 149.9 lb

## 2013-12-03 DIAGNOSIS — E039 Hypothyroidism, unspecified: Secondary | ICD-10-CM

## 2013-12-03 DIAGNOSIS — E785 Hyperlipidemia, unspecified: Secondary | ICD-10-CM

## 2013-12-03 DIAGNOSIS — E1129 Type 2 diabetes mellitus with other diabetic kidney complication: Secondary | ICD-10-CM

## 2013-12-03 NOTE — Patient Instructions (Signed)
Levemir 26 in the morning and 16 in the evening,  Humalog 16-22-30 BEFORE MEALS  May take 2-4 units less Humalog if <100

## 2013-12-03 NOTE — Progress Notes (Signed)
Patient ID: [name], female   DOB: 19-Sep-1926, 79 y.o.   MRN: [id number]  Reason for Appointment: Diabetes follow-up   History of Present Illness   Diagnosis: Type 2 DIABETES MELITUS, long-standing  Previous history: She has been on insulin for several years and usually is compliant with her basal bolus regimen. However she usually has significant fluctuation in her blood sugars and difficult to have consistent trend. Overall blood sugars are usually high after meals  Insulin regimen: Levemir 24 in the morning and 18 in the evening, Humalog 16-- 22--28 AC    RECENT history:   The patient's diabetes control appears to be still inconsistent and overall has high readings especially non-fasting She thinks she is following instructions given on her last visit for insulin but is taking 28 units at suppertime instead of 30 Also now says that she will not take her insulin if her blood sugar is below 100 before meals She still has sporadic very high readings over 300 in the afternoon and evening but less recently  She has had mostly high readings around lunchtime and after supper Overall her lowest readings are in the morning fasting with lower readings this week She is quite insulin resistant requiring relatively large amounts of mealtime coverage She thinks she is compliant with her mealtime insulin and is taking the doses discussed on the last visit.   Proper timing of medications in relation to meals: Yes.          Monitors blood glucose:  3 times a day.    Glucometer: One Touch.          Blood Glucose readings from meter download:   PREMEAL Breakfast Lunch Dinner Bedtime Overall  Glucose range:  78-276   135-242   105-231   108-351    Mean/median:  157   221    117   191    POST-MEAL PC Breakfast PC Lunch PC Dinner  Glucose range:  255   176-373   194-383   Mean/median:    231    Hypoglycemia:  none recently Meals: 3 meals per day.  for breakfast he will have an egg and toast but  sometimes cheese toast. Suppertime about 5:30 p.m.    Physical activity:  minimal           Complications: are: Nephropathy    Eye exams: Have been regular   Wt Readings from Last 3 Encounters:  12/03/13 149 lb 14.4 oz (67.994 kg)  12/01/13 148 lb 14.4 oz (67.541 kg)  11/25/13 149 lb 12.8 oz (67.949 kg)   DIABETES labs  Lab Results  Component Value Date   HGBA1C 8.9* 11/24/2013   HGBA1C 9.5* 07/29/2013   HGBA1C 6.7* 08/10/2012   Lab Results  Component Value Date   MICROALBUR 7.2* 11/24/2013   LDLCALC 54 11/24/2013   CREATININE 1.6* 11/24/2013      Medication List       This list is accurate as of: 12/03/13 11:02 AM.  Always use your most recent med list.               atorvastatin 20 MG tablet  Commonly known as:  LIPITOR  Take 1 tablet (20 mg total) by mouth at bedtime.     diltiazem 360 MG 24 hr capsule  Commonly known as:  CARDIZEM CD  Take 180 mg by mouth daily.     furosemide 40 MG tablet  Commonly known as:  LASIX  Take 20 mg by mouth  daily.     glucose blood test strip  Commonly known as:  ONE TOUCH TEST STRIPS  Use as instructed to check blood sugars 4 times per day dx code 250.00     HUMALOG KWIKPEN 100 UNIT/ML Sopn  Generic drug:  insulin lispro  INJECT 14 TO 28 UNITS UNDER THE SKIN THREE TIMES A DAY WITH MEALS (16 UNITS AT BREAKFAST, 22 UNITS AT LUNCH, AND 28 UNITS AT SUPPER)     HYDROcodone-acetaminophen 5-325 MG per tablet  Commonly known as:  NORCO/VICODIN  Take 1 tablet by mouth every 6 (six) hours as needed for moderate pain.     insulin detemir 100 UNIT/ML injection  Commonly known as:  LEVEMIR  Inject 24 Units into the skin at bedtime. 14 in am and 20 in pm     levothyroxine 100 MCG tablet  Commonly known as:  SYNTHROID, LEVOTHROID  Take 100 mcg by mouth every morning.     metoprolol succinate 25 MG 24 hr tablet  Commonly known as:  TOPROL-XL  Take 25 mg by mouth 2 (two) times daily.     potassium chloride 10 MEQ tablet  Commonly  known as:  KLOR-CON M10  Take 1 tablet (10 mEq total) by mouth daily.     silver sulfADIAZINE 1 % cream  Commonly known as:  SILVADENE  Apply 1 application topically daily.     warfarin 5 MG tablet  Commonly known as:  COUMADIN  Take 2.5-5 mg by mouth daily. Takes 5mg  daily except 2.5mg  on Monday, Wednesdays andFridays        Allergies:  Allergies  Allergen Reactions  . Tenex [Guanfacine Hcl] Other (See Comments)    hypotension  . Crestor [Rosuvastatin] Other (See Comments)    Myalgia    Past Medical History  Diagnosis Date  . Diabetes mellitus   . Hypertension   . Hyperlipidemia   . Obesity   . Hypothyroidism   . Vulvar cancer, carcinoma 11/03/2011  . Stroke 05/2000    right brain CVA pre H&P 2001  . Atrial fibrillation   . Cancer 2007    vulva-invasive well diff squamous cell ca  . CVA (cerebral infarction)   . Dyslipidemia     Past Surgical History  Procedure Laterality Date  . Vulvar lesion removal  2012  . Breast surgery      Lumpectomy  . Dilation and curettage of uterus    . Cystourethroplasty / ureteroneocystostomy    . Radical vulvectomy    . Lymphadenectomy  2007    R inguinal femoral  . Radical vulvectomy  02/27/12  . Vulvar lesion removal  02/27/2012    Procedure: VULVAR LESION;  Surgeon: Jeannette Corpus, MD;  Location: [facility];  Service: Gynecology;  Laterality: N/A;  . Right carotid endarterctomy    . Left eye surgery      , permanently stitched eye together    Family History  Problem Relation Age of Onset  . Diabetes type II    . Coronary artery disease Mother   . Lung cancer Brother   . Diabetes type II Brother     Social History:  reports that she quit smoking about 46 years ago. Her smoking use included Cigarettes. She has a 4.5 pack-year smoking history. She does not have any smokeless tobacco history on file. She reports that she does not drink alcohol or use illicit drugs.  Review of Systems:  HYPERTENSION:  her blood  pressure has been  controlled with diltiazem and  metoprolol, also being used for rate control. Blood pressure tends to be low normal now.  She has a history of atrial fibrillation, taking Coumadin No recent shortness of breath   HYPERLIPIDEMIA: well-controlled  She has had persistent hoarseness  She is still having problems with her left eye and scarring of her eyelid, also decreased vision  She has a long-standing history of hypothyroidism and last TSH was 1.5    Chronic kidney disease/nephropathy: Has had mild increase in creatinine  of 1.6, has history of proteinuria but microalbumin is now normal   Examination:   BP 110/62  Pulse 90  Temp(Src) 98.3 F (36.8 C)  Resp 14  Ht 5\' 4"  (1.626 m)  Wt 149 lb 14.4 oz (67.994 kg)  BMI 25.72 kg/m2  SpO2 95%  Body mass index is 25.72 kg/(m^2).   Heart sounds irregular Lungs clear No pedal edema Foot exam done  ASSESSMENT/ PLAN::   Diabetes type 2   The patient's diabetes control appears to be inadequate although reasonably good for her age A1c is still close to 9% but slightly better Since she has difficulty with getting consistent controlled well not increase insulin aggressively However she needs to take her insulin consistently with every meal even when the blood sugar is normal and this was discussed She can reduce her mealtime dose by 2-6 units if afraid of hypoglycemia and may take the insulin postprandially  Will increase her LEVEMIR in the morning and reduce the dose in the evening by 2 units Again will need to increase her suppertime coverage by 2 units, written instructions given She will continue to have protein at breakfast  No significant neuropathy on exam but she has decreased pulses  CKD: We will continue to monitor. Check PTH also on next visit  Atrial fibrillation: Still has relatively fast heart rate  Counseling time over 50% of today's 25 minute visit   Tyjay Galindo 12/03/2013, 11:02 AM

## 2013-12-07 ENCOUNTER — Encounter: Payer: Self-pay | Admitting: Radiation Oncology

## 2013-12-07 NOTE — Progress Notes (Signed)
  Radiation Oncology         [phone]) [phone] ________________________________  Name: [name] MRN: [id number]  Date: 12/07/2013  DOB: 1926-05-19  End of Treatment Note  Diagnosis:   Recurrent vulvar carcinoma     Indication for treatment:  Definitive treatment       Radiation treatment dates:   October 27 through December 8  Site/dose:   Perineum area and lower pelvis,  60 gray in 30 fractions  Beams/energy:   Helical IMRT, 6 MV photons with treatment on the Tomotherapy unit  Narrative: The patient tolerated radiation treatment relatively well.   She did have some fatigue as well as discomfort in the treatment area. She was most comfortable when lying flat. She did develop moist desquamation as expected which responded well to Silvadene and sitz baths.  Plan: The patient has completed radiation treatment. The patient will return to radiation oncology clinic for routine followup in one week. I advised them to call or return sooner if they have any questions or concerns related to their recovery or treatment. She will be followed closely until she has had healing of her skin breakdown.  -----------------------------------  [name], PhD, MD

## 2013-12-08 ENCOUNTER — Encounter: Payer: Self-pay | Admitting: Oncology

## 2013-12-09 ENCOUNTER — Encounter: Payer: Self-pay | Admitting: Radiation Oncology

## 2013-12-09 ENCOUNTER — Ambulatory Visit
Admission: RE | Admit: 2013-12-09 | Discharge: 2013-12-09 | Disposition: A | Discharge status: Home / Self Care | Payer: Medicare Other | Source: Ambulatory Visit | Attending: Radiation Oncology | Admitting: Radiation Oncology

## 2013-12-09 VITALS — BP 130/76 | HR 101 | Temp 97.4°F | Ht 64.0 in | Wt 143.4 lb

## 2013-12-09 DIAGNOSIS — C519 Malignant neoplasm of vulva, unspecified: Secondary | ICD-10-CM

## 2013-12-09 NOTE — Progress Notes (Signed)
[name] here with her husband for follow up after treatment to her perineum and pelvis.  She denies pain now.  She does have pain if she sits for a long time.  She reports that her skin is better.  She is using silvadene twice a day.  She denies bladder changes.  She did have diarrhea on Sunday which is gone now.  She reports seeing "a tinge of blood" from her rectal/vaginal area.  She does have fatigue.

## 2013-12-09 NOTE — Progress Notes (Signed)
Radiation Oncology         [phone] ________________________________  Name: [name] MRN: [id number]  Date: 12/09/2013  DOB: 03/29/1926  Follow-Up Visit Note  CC: [name], MD  [place] *  Diagnosis:   Recurrent vulvar carcinoma  Interval Since Last Radiation:  8  days  Narrative:  The patient returns today for close follow-up in light of her radiation reaction at the completion of treatment. Overall the patient is having less discomfort in the perineum. She denies any bleeding. She denies any bowel problems or urination difficulties. She denies any hematuria or rectal bleeding. She continues to use Silvadene. She is not doing sitz baths but using a shower with handle to help clean the area.                           ALLERGIES:  is allergic to tenex and crestor.  Meds: Current Outpatient Prescriptions  Medication Sig Dispense Refill  . atorvastatin (LIPITOR) 20 MG tablet Take 1 tablet (20 mg total) by mouth at bedtime.  30 tablet  6  . diltiazem (CARDIZEM CD) 360 MG 24 hr capsule Take 180 mg by mouth daily.       . furosemide (LASIX) 40 MG tablet Take 20 mg by mouth daily.       [name] glucose blood (ONE TOUCH TEST STRIPS) test strip Use as instructed to check blood sugars 4 times per day dx code 250.00  150 each  12  . HUMALOG KWIKPEN 100 UNIT/ML SOPN INJECT 14 TO 28 UNITS UNDER THE SKIN THREE TIMES A DAY WITH MEALS (16 UNITS AT BREAKFAST, 22 UNITS AT LUNCH, AND 28 UNITS AT SUPPER)  10 pen  4  . HYDROcodone-acetaminophen (NORCO/VICODIN) 5-325 MG per tablet Take 1 tablet by mouth every 6 (six) hours as needed for moderate pain.  30 tablet  0  . insulin detemir (LEVEMIR) 100 UNIT/ML injection Inject 24 Units into the skin at bedtime. 14 in am and 20 in pm      . levothyroxine (SYNTHROID, LEVOTHROID) 100 MCG tablet Take 100 mcg by mouth every morning.       . metoprolol succinate (TOPROL-XL) 25 MG 24 hr tablet Take 25 mg by mouth 2 (two) times daily.       . potassium  chloride (KLOR-CON M10) 10 MEQ tablet Take 1 tablet (10 mEq total) by mouth daily.  30 tablet  5  . silver sulfADIAZINE (SILVADENE) 1 % cream Apply 1 application topically daily.      [name] warfarin (COUMADIN) 5 MG tablet Take 2.5-5 mg by mouth daily. Takes 5mg  daily except 2.5mg  on Monday, Wednesdays andFridays       No current facility-administered medications for this encounter.    Physical Findings: The patient is in no acute distress. Patient is alert and oriented.  height is 5\' 4"  (1.626 m) and weight is 143 lb 6.4 oz (65.046 kg). Her temperature is 97.4 F (36.3 C). Her blood pressure is 130/76 and her pulse is 101. [name]  No obvious signs of infection in the perineum. The patient's skin has improved over the past week.  Lab Findings: Lab Results  Component Value Date   WBC 7.8 09/24/2013   HGB 13.0 09/24/2013   HCT 39.4 09/24/2013   MCV 98.3 09/24/2013   PLT 157.0 09/24/2013      Radiographic Findings: No results found.  Impression:  The patient is recovering from the effects of radiation.  Plan:  She will be seen by Dr. Stanford Breed later this week for a more thorough examination.  I would anticipate residual tumor at this point since she just completed radiation therapy.  Routine followup in radiation oncology in one month.  She will continue using Silvadene for now.  _____________________________________  -----------------------------------  [name], PhD, MD

## 2013-12-12 ENCOUNTER — Ambulatory Visit: Payer: Medicare Other | Attending: Gynecology | Admitting: Gynecology

## 2013-12-12 ENCOUNTER — Encounter: Payer: Self-pay | Admitting: Gynecology

## 2013-12-12 VITALS — BP 148/59 | HR 110 | Temp 97.8°F | Resp 16 | Ht 63.0 in | Wt 148.5 lb

## 2013-12-12 DIAGNOSIS — E669 Obesity, unspecified: Secondary | ICD-10-CM | POA: Insufficient documentation

## 2013-12-12 DIAGNOSIS — E039 Hypothyroidism, unspecified: Secondary | ICD-10-CM | POA: Insufficient documentation

## 2013-12-12 DIAGNOSIS — Z7901 Long term (current) use of anticoagulants: Secondary | ICD-10-CM | POA: Insufficient documentation

## 2013-12-12 DIAGNOSIS — Z923 Personal history of irradiation: Secondary | ICD-10-CM | POA: Insufficient documentation

## 2013-12-12 DIAGNOSIS — Z9079 Acquired absence of other genital organ(s): Secondary | ICD-10-CM | POA: Insufficient documentation

## 2013-12-12 DIAGNOSIS — Z794 Long term (current) use of insulin: Secondary | ICD-10-CM | POA: Insufficient documentation

## 2013-12-12 DIAGNOSIS — I10 Essential (primary) hypertension: Secondary | ICD-10-CM | POA: Insufficient documentation

## 2013-12-12 DIAGNOSIS — Z79899 Other long term (current) drug therapy: Secondary | ICD-10-CM | POA: Insufficient documentation

## 2013-12-12 DIAGNOSIS — L98499 Non-pressure chronic ulcer of skin of other sites with unspecified severity: Secondary | ICD-10-CM | POA: Insufficient documentation

## 2013-12-12 DIAGNOSIS — C519 Malignant neoplasm of vulva, unspecified: Secondary | ICD-10-CM | POA: Insufficient documentation

## 2013-12-12 DIAGNOSIS — Z8673 Personal history of transient ischemic attack (TIA), and cerebral infarction without residual deficits: Secondary | ICD-10-CM | POA: Insufficient documentation

## 2013-12-12 DIAGNOSIS — I4891 Unspecified atrial fibrillation: Secondary | ICD-10-CM | POA: Insufficient documentation

## 2013-12-12 DIAGNOSIS — E785 Hyperlipidemia, unspecified: Secondary | ICD-10-CM | POA: Insufficient documentation

## 2013-12-12 DIAGNOSIS — Z87891 Personal history of nicotine dependence: Secondary | ICD-10-CM | POA: Insufficient documentation

## 2013-12-12 DIAGNOSIS — E119 Type 2 diabetes mellitus without complications: Secondary | ICD-10-CM | POA: Insufficient documentation

## 2013-12-12 NOTE — Patient Instructions (Signed)
Plan to follow up with Dr. Stanford Breed on Jan 26 or sooner if needed.  Please call for any questions or concerns.  Happy Holidays!

## 2013-12-12 NOTE — Progress Notes (Signed)
Consult Note: Gyn-Onc   [name] 79 y.o. female  Chief Complaint  Patient presents with  . 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 She has had the expected radiation side effects to the vulvar skin. However, the area seems to be healing.  Plan: Patient return to see me in late January for further evaluation.Doreatha Lew continue her current regimen of Silvadene cream and vulvar hygiene.  Interval History: The patient returns today   having completed external beam radiation therapy on 12/01/2013. She had the expected vulvar skin breakdown and is managing that well with Silvadene cream and vulvar hygiene. She denies any difficulty with bowel movements and is not having significant pain.   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 At that time she underwent a modified radical vulvectomy and right inguinal lymphadenectomy. Lymph nodes and surgical margins were negative. She subsequently developed a recurrence on the left vulva in October 2008 underwent left modified radical vulvectomy. Given her age and overall poor performance status we did not perform an inguinal lymphadenectomy.  The patient subsequently had a third recurrence of October 2009 and this was excised and the rhomboid flap was used to fill the surgical defect. Surgical margins were again negative. Her most recent recurrences in September 2012 which is again excised.  She had a superficial wound separation which closed by secondary intent.   She had yet another recurrence in November 2014 treated with external beam radiation therapy because of the lesion location very close to the anus.     Review of System Ten point review of systems is negative except as noted above      Allergies  Allergen Reactions  . Tenex [Guanfacine Hcl] Other (See Comments)    hypotension  . Crestor [Rosuvastatin] Other (See  Comments)    Myalgia    Past Medical History  Diagnosis Date  . Diabetes mellitus   . Hypertension   . Hyperlipidemia   . Obesity   . Hypothyroidism   . Vulvar cancer, carcinoma 11/03/2011  . Stroke 05/2000    right brain CVA pre H&P 2001  . Atrial fibrillation   . Cancer 2007    vulva-invasive well diff squamous cell ca  . CVA (cerebral infarction)   . Dyslipidemia   . History of radiation therapy 10/20/2013-12/01/2013    60 gray to perineum area and lower pelvis    Past Surgical History  Procedure Laterality Date  . Vulvar lesion removal  2012  . Breast surgery      Lumpectomy  . Dilation and curettage of uterus    . Cystourethroplasty / ureteroneocystostomy    . Radical vulvectomy    . Lymphadenectomy  2007    R inguinal femoral  . Radical vulvectomy  02/27/12  . Vulvar lesion removal  02/27/2012    Procedure: VULVAR LESION;  Surgeon: Jeannette Corpus, MD;  Location: [facility];  Service: Gynecology;  Laterality: N/A;  . Right carotid endarterctomy    . Left eye surgery      , permanently stitched eye together    Current Outpatient Prescriptions  Medication Sig Dispense Refill  . atorvastatin (LIPITOR) 20 MG tablet Take 1 tablet (20 mg total) by mouth at bedtime.  30 tablet  6  . diltiazem (CARDIZEM CD) 360 MG 24 hr capsule Take 180 mg by mouth daily.       . furosemide (LASIX)  40 MG tablet Take 20 mg by mouth daily.       [name] glucose blood (ONE TOUCH TEST STRIPS) test strip Use as instructed to check blood sugars 4 times per day dx code 250.00  150 each  12  . HUMALOG KWIKPEN 100 UNIT/ML SOPN INJECT 14 TO 28 UNITS UNDER THE SKIN THREE TIMES A DAY WITH MEALS (16 UNITS AT BREAKFAST, 22 UNITS AT LUNCH, AND 28 UNITS AT SUPPER)  10 pen  4  . HYDROcodone-acetaminophen (NORCO/VICODIN) 5-325 MG per tablet Take 1 tablet by mouth every 6 (six) hours as needed for moderate pain.  30 tablet  0  . insulin detemir (LEVEMIR) 100 UNIT/ML injection Inject 24 Units into the skin at  bedtime. 14 in am and 20 in pm      . levothyroxine (SYNTHROID, LEVOTHROID) 100 MCG tablet Take 100 mcg by mouth every morning.       . metoprolol succinate (TOPROL-XL) 25 MG 24 hr tablet Take 25 mg by mouth 2 (two) times daily.       . potassium chloride (KLOR-CON M10) 10 MEQ tablet Take 1 tablet (10 mEq total) by mouth daily.  30 tablet  5  . silver sulfADIAZINE (SILVADENE) 1 % cream Apply 1 application topically daily.      [name] warfarin (COUMADIN) 5 MG tablet Take 2.5-5 mg by mouth daily. Takes 5mg  daily except 2.5mg  on Monday, Wednesdays andFridays       No current facility-administered medications for this visit.    History   Social History  . Marital Status: Married    Spouse Name: N/A    Number of Children: N/A  . Years of Education: N/A   Occupational History  . Not on file.   Social History Main Topics  . Smoking status: Former Smoker -- 0.25 packs/day for 18 years    Types: Cigarettes    Quit date: 11/03/1967  . Smokeless tobacco: Not on file  . Alcohol Use: No  . Drug Use: No  . Sexual Activity: Not Currently   Other Topics Concern  . Not on file   Social History Narrative  . No narrative on file    Family History  Problem Relation Age of Onset  . Diabetes type II    . Coronary artery disease Mother   . Lung cancer Brother   . Diabetes type II Brother        Vitals: Blood pressure 148/59, pulse 110, temperature 97.8 F (36.6 C), temperature source Oral, resp. rate 16, height 5\' 3"  (1.6 m), weight 148 lb 8 oz (67.359 kg).  Physical Exam: In general the patient is a pleasant elderly white female no acute distress.  HEENT is negative except that her left eyelid has been partially closed surgically.. Neck is supple without thyromegaly.  There is no supraclavicular or inguinal adenopathy.  The abdomen is soft nontender no masses organomegaly or ascites is noted.  Pelvic exam EGBUS is status post radical vulvectomy with advancement flaps. There is an  ulcerated area on the left perianal region where the recurrence recently was treated with radiation therapy. Certainly the exophytic portion of the tumor has resolved. It is probably too early to assess whether she will have a complete response.      Jeannette Corpus, MD 12/12/2013, 8:41 AM                         Consult Note: Gyn-Onc   [name] 79 y.o. female  Chief Complaint  Patient presents with  . Vulvar Cancer    Follow up     Interval History:   HPI:  Allergies  Allergen Reactions  . Tenex [Guanfacine Hcl] Other (See Comments)    hypotension  . Crestor [Rosuvastatin] Other (See Comments)    Myalgia    Past Medical History  Diagnosis Date  . Diabetes mellitus   . Hypertension   . Hyperlipidemia   . Obesity   . Hypothyroidism   . Vulvar cancer, carcinoma 11/03/2011  . Stroke 05/2000    right brain CVA pre H&P 2001  . Atrial fibrillation   . Cancer 2007    vulva-invasive well diff squamous cell ca  . CVA (cerebral infarction)   . Dyslipidemia   . History of radiation therapy 10/20/2013-12/01/2013    60 gray to perineum area and lower pelvis    Past Surgical History  Procedure Laterality Date  . Vulvar lesion removal  2012  . Breast surgery      Lumpectomy  . Dilation and curettage of uterus    . Cystourethroplasty / ureteroneocystostomy    . Radical vulvectomy    . Lymphadenectomy  2007    R inguinal femoral  . Radical vulvectomy  02/27/12  . Vulvar lesion removal  02/27/2012    Procedure: VULVAR LESION;  Surgeon: Jeannette Corpus, MD;  Location: [facility];  Service: Gynecology;  Laterality: N/A;  . Right carotid endarterctomy    . Left eye surgery      , permanently stitched eye together    Current Outpatient Prescriptions  Medication Sig Dispense Refill  . atorvastatin (LIPITOR) 20 MG tablet Take 1 tablet (20 mg total) by mouth at bedtime.  30 tablet  6  . diltiazem (CARDIZEM CD) 360 MG 24 hr capsule Take 180  mg by mouth daily.       . furosemide (LASIX) 40 MG tablet Take 20 mg by mouth daily.       [name] glucose blood (ONE TOUCH TEST STRIPS) test strip Use as instructed to check blood sugars 4 times per day dx code 250.00  150 each  12  . HUMALOG KWIKPEN 100 UNIT/ML SOPN INJECT 14 TO 28 UNITS UNDER THE SKIN THREE TIMES A DAY WITH MEALS (16 UNITS AT BREAKFAST, 22 UNITS AT LUNCH, AND 28 UNITS AT SUPPER)  10 pen  4  . HYDROcodone-acetaminophen (NORCO/VICODIN) 5-325 MG per tablet Take 1 tablet by mouth every 6 (six) hours as needed for moderate pain.  30 tablet  0  . insulin detemir (LEVEMIR) 100 UNIT/ML injection Inject 24 Units into the skin at bedtime. 14 in am and 20 in pm      . levothyroxine (SYNTHROID, LEVOTHROID) 100 MCG tablet Take 100 mcg by mouth every morning.       . metoprolol succinate (TOPROL-XL) 25 MG 24 hr tablet Take 25 mg by mouth 2 (two) times daily.       . potassium chloride (KLOR-CON M10) 10 MEQ tablet Take 1 tablet (10 mEq total) by mouth daily.  30 tablet  5  . silver sulfADIAZINE (SILVADENE) 1 % cream Apply 1 application topically daily.      [name] warfarin (COUMADIN) 5 MG tablet Take 2.5-5 mg by mouth daily. Takes 5mg  daily except 2.5mg  on Monday, Wednesdays andFridays       No current facility-administered medications for this visit.    History   Social History  . Marital Status: Married    Spouse Name: N/A  Number of Children: N/A  . Years of Education: N/A   Occupational History  . Not on file.   Social History Main Topics  . Smoking status: Former Smoker -- 0.25 packs/day for 18 years    Types: Cigarettes    Quit date: 11/03/1967  . Smokeless tobacco: Not on file  . Alcohol Use: No  . Drug Use: No  . Sexual Activity: Not Currently   Other Topics Concern  . Not on file   Social History Narrative  . No narrative on file    Family History  Problem Relation Age of Onset  . Diabetes type II    . Coronary artery disease Mother   . Lung cancer Brother   .  Diabetes type II Brother     Review of Systems:  Vitals: Blood pressure 148/59, pulse 110, temperature 97.8 F (36.6 C), temperature source Oral, resp. rate 16, height 5\' 3"  (1.6 m), weight 148 lb 8 oz (67.359 kg).  Physical Exam:  Assessment/Plan:   Jeannette Corpus, MD 12/12/2013, 8:41 AM                         Consult Note: Gyn-Onc   [name] 79 y.o. female  Chief Complaint  Patient presents with  . Vulvar Cancer    Follow up     Interval History: The patient returns today  last being seen in April 2013. Subsequent to that visit, she's had a number of medical problems including a stroke, atrial fibrillation, and some ophthalmologic problems. On direct questioning the patient does admit and her husband concurs that there is a new lesion on her vulva. She says it "stings". She has not had any bleeding.  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 At that time she underwent a modified radical vulvectomy and right inguinal lymphadenectomy. Lymph nodes and surgical margins were negative. She subsequently developed a recurrence on the left vulva in October 2008 underwent left modified radical vulvectomy. Given her age and overall poor performance status we did not perform an inguinal lymphadenectomy.  The patient subsequently had a third recurrence of October 2009 and this was excised and the rhomboid flap was used to fill the surgical defect. Surgical margins were again negative. Her most recent recurrences in September 2012 which is again excised.  She had a superficial wound separation which closed by secondary intent.      Review of System Ten point review of systems is negative except as noted above      Allergies  Allergen Reactions  . Tenex [Guanfacine Hcl] Other (See Comments)    hypotension  . Crestor [Rosuvastatin] Other (See Comments)    Myalgia    Past Medical History  Diagnosis Date  .  Diabetes mellitus   . Hypertension   . Hyperlipidemia   . Obesity   . Hypothyroidism   . Vulvar cancer, carcinoma 11/03/2011  . Stroke 05/2000    right brain CVA pre H&P 2001  . Atrial fibrillation   . Cancer 2007    vulva-invasive well diff squamous cell ca  . CVA (cerebral infarction)   . Dyslipidemia   . History of radiation therapy 10/20/2013-12/01/2013    60 gray to perineum area and lower pelvis    Past Surgical History  Procedure Laterality Date  . Vulvar lesion removal  2012  . Breast surgery      Lumpectomy  . Dilation and curettage of uterus    .  Cystourethroplasty / ureteroneocystostomy    . Radical vulvectomy    . Lymphadenectomy  2007    R inguinal femoral  . Radical vulvectomy  02/27/12  . Vulvar lesion removal  02/27/2012    Procedure: VULVAR LESION;  Surgeon: Jeannette Corpus, MD;  Location: [facility];  Service: Gynecology;  Laterality: N/A;  . Right carotid endarterctomy    . Left eye surgery      , permanently stitched eye together    Current Outpatient Prescriptions  Medication Sig Dispense Refill  . atorvastatin (LIPITOR) 20 MG tablet Take 1 tablet (20 mg total) by mouth at bedtime.  30 tablet  6  . diltiazem (CARDIZEM CD) 360 MG 24 hr capsule Take 180 mg by mouth daily.       . furosemide (LASIX) 40 MG tablet Take 20 mg by mouth daily.       [name] glucose blood (ONE TOUCH TEST STRIPS) test strip Use as instructed to check blood sugars 4 times per day dx code 250.00  150 each  12  . HUMALOG KWIKPEN 100 UNIT/ML SOPN INJECT 14 TO 28 UNITS UNDER THE SKIN THREE TIMES A DAY WITH MEALS (16 UNITS AT BREAKFAST, 22 UNITS AT LUNCH, AND 28 UNITS AT SUPPER)  10 pen  4  . HYDROcodone-acetaminophen (NORCO/VICODIN) 5-325 MG per tablet Take 1 tablet by mouth every 6 (six) hours as needed for moderate pain.  30 tablet  0  . insulin detemir (LEVEMIR) 100 UNIT/ML injection Inject 24 Units into the skin at bedtime. 14 in am and 20 in pm      . levothyroxine (SYNTHROID,  LEVOTHROID) 100 MCG tablet Take 100 mcg by mouth every morning.       . metoprolol succinate (TOPROL-XL) 25 MG 24 hr tablet Take 25 mg by mouth 2 (two) times daily.       . potassium chloride (KLOR-CON M10) 10 MEQ tablet Take 1 tablet (10 mEq total) by mouth daily.  30 tablet  5  . silver sulfADIAZINE (SILVADENE) 1 % cream Apply 1 application topically daily.      [name] warfarin (COUMADIN) 5 MG tablet Take 2.5-5 mg by mouth daily. Takes 5mg  daily except 2.5mg  on Monday, Wednesdays andFridays       No current facility-administered medications for this visit.    History   Social History  . Marital Status: Married    Spouse Name: N/A    Number of Children: N/A  . Years of Education: N/A   Occupational History  . Not on file.   Social History Main Topics  . Smoking status: Former Smoker -- 0.25 packs/day for 18 years    Types: Cigarettes    Quit date: 11/03/1967  . Smokeless tobacco: Not on file  . Alcohol Use: No  . Drug Use: No  . Sexual Activity: Not Currently   Other Topics Concern  . Not on file   Social History Narrative  . No narrative on file    Family History  Problem Relation Age of Onset  . Diabetes type II    . Coronary artery disease Mother   . Lung cancer Brother   . Diabetes type II Brother        Vitals: Blood pressure 148/59, pulse 110, temperature 97.8 F (36.6 C), temperature source Oral, resp. rate 16, height 5\' 3"  (1.6 m), weight 148 lb 8 oz (67.359 kg).  Physical Exam: In general the patient is a pleasant elderly white female no acute distress.  HEENT is negative  except that her left eyelid has been partially closed surgically.. Neck is supple without thyromegaly.  There is no supraclavicular or inguinal adenopathy.  The abdomen is soft nontender no masses organomegaly or ascites is noted.  Pelvic exam EGBUS is status post radical vulvectomy with advancement flaps. She now has a new 4 cm fungating lesion just to the left of the  anus. Assessment/Plan:recurrent vulvar cancer adjacent to the anus.   Given the location of this new lesion, I do not believe that surgery would be appropriate as the patient would require an extensive operation including resection of the anal sphincter colostomy. Therefore would like to refer her to radiation oncology for consideration of external beam radiation therapy to this lesion. The patient and her husband are in agreement with this plan.   Jeannette Corpus, MD 12/12/2013, 8:42 AM                         Consult Note: Gyn-Onc   [name] 79 y.o. female  Chief Complaint  Patient presents with  . Vulvar Cancer    Follow up     Interval History:   HPI:  Allergies  Allergen Reactions  . Tenex [Guanfacine Hcl] Other (See Comments)    hypotension  . Crestor [Rosuvastatin] Other (See Comments)    Myalgia    Past Medical History  Diagnosis Date  . Diabetes mellitus   . Hypertension   . Hyperlipidemia   . Obesity   . Hypothyroidism   . Vulvar cancer, carcinoma 11/03/2011  . Stroke 05/2000    right brain CVA pre H&P 2001  . Atrial fibrillation   . Cancer 2007    vulva-invasive well diff squamous cell ca  . CVA (cerebral infarction)   . Dyslipidemia   . History of radiation therapy 10/20/2013-12/01/2013    60 gray to perineum area and lower pelvis    Past Surgical History  Procedure Laterality Date  . Vulvar lesion removal  2012  . Breast surgery      Lumpectomy  . Dilation and curettage of uterus    . Cystourethroplasty / ureteroneocystostomy    . Radical vulvectomy    . Lymphadenectomy  2007    R inguinal femoral  . Radical vulvectomy  02/27/12  . Vulvar lesion removal  02/27/2012    Procedure: VULVAR LESION;  Surgeon: Jeannette Corpus, MD;  Location: [facility];  Service: Gynecology;  Laterality: N/A;  . Right carotid endarterctomy    . Left eye surgery      , permanently stitched eye together    Current Outpatient  Prescriptions  Medication Sig Dispense Refill  . atorvastatin (LIPITOR) 20 MG tablet Take 1 tablet (20 mg total) by mouth at bedtime.  30 tablet  6  . diltiazem (CARDIZEM CD) 360 MG 24 hr capsule Take 180 mg by mouth daily.       . furosemide (LASIX) 40 MG tablet Take 20 mg by mouth daily.       [name] glucose blood (ONE TOUCH TEST STRIPS) test strip Use as instructed to check blood sugars 4 times per day dx code 250.00  150 each  12  . HUMALOG KWIKPEN 100 UNIT/ML SOPN INJECT 14 TO 28 UNITS UNDER THE SKIN THREE TIMES A DAY WITH MEALS (16 UNITS AT BREAKFAST, 22 UNITS AT LUNCH, AND 28 UNITS AT SUPPER)  10 pen  4  . HYDROcodone-acetaminophen (NORCO/VICODIN) 5-325 MG per tablet Take 1 tablet by mouth every 6 (six)  hours as needed for moderate pain.  30 tablet  0  . insulin detemir (LEVEMIR) 100 UNIT/ML injection Inject 24 Units into the skin at bedtime. 14 in am and 20 in pm      . levothyroxine (SYNTHROID, LEVOTHROID) 100 MCG tablet Take 100 mcg by mouth every morning.       . metoprolol succinate (TOPROL-XL) 25 MG 24 hr tablet Take 25 mg by mouth 2 (two) times daily.       . potassium chloride (KLOR-CON M10) 10 MEQ tablet Take 1 tablet (10 mEq total) by mouth daily.  30 tablet  5  . silver sulfADIAZINE (SILVADENE) 1 % cream Apply 1 application topically daily.      [name] warfarin (COUMADIN) 5 MG tablet Take 2.5-5 mg by mouth daily. Takes 5mg  daily except 2.5mg  on Monday, Wednesdays andFridays       No current facility-administered medications for this visit.    History   Social History  . Marital Status: Married    Spouse Name: N/A    Number of Children: N/A  . Years of Education: N/A   Occupational History  . Not on file.   Social History Main Topics  . Smoking status: Former Smoker -- 0.25 packs/day for 18 years    Types: Cigarettes    Quit date: 11/03/1967  . Smokeless tobacco: Not on file  . Alcohol Use: No  . Drug Use: No  . Sexual Activity: Not Currently   Other Topics Concern  .  Not on file   Social History Narrative  . No narrative on file    Family History  Problem Relation Age of Onset  . Diabetes type II    . Coronary artery disease Mother   . Lung cancer Brother   . Diabetes type II Brother     Review of Systems:  Vitals: Blood pressure 148/59, pulse 110, temperature 97.8 F (36.6 C), temperature source Oral, resp. rate 16, height 5\' 3"  (1.6 m), weight 148 lb 8 oz (67.359 kg).  Physical Exam:  Assessment/Plan:   Jeannette Corpus, MD 12/12/2013, 8:42 AM

## 2013-12-16 ENCOUNTER — Ambulatory Visit (INDEPENDENT_AMBULATORY_CARE_PROVIDER_SITE_OTHER): Payer: Medicare Other | Admitting: *Deleted

## 2013-12-16 DIAGNOSIS — I4891 Unspecified atrial fibrillation: Secondary | ICD-10-CM

## 2013-12-16 DIAGNOSIS — I639 Cerebral infarction, unspecified: Secondary | ICD-10-CM

## 2013-12-16 DIAGNOSIS — I6350 Cerebral infarction due to unspecified occlusion or stenosis of unspecified cerebral artery: Secondary | ICD-10-CM

## 2013-12-16 LAB — POCT INR: INR: 2.9

## 2013-12-20 ENCOUNTER — Other Ambulatory Visit: Payer: Self-pay | Admitting: Endocrinology

## 2013-12-29 ENCOUNTER — Ambulatory Visit: Payer: Medicare Other | Admitting: Endocrinology

## 2014-01-12 ENCOUNTER — Encounter: Payer: Self-pay | Admitting: Radiation Oncology

## 2014-01-12 ENCOUNTER — Ambulatory Visit
Admission: RE | Admit: 2014-01-12 | Discharge: 2014-01-12 | Disposition: A | Discharge status: Home / Self Care | Payer: Medicare Other | Source: Ambulatory Visit | Attending: Radiation Oncology | Admitting: Radiation Oncology

## 2014-01-12 ENCOUNTER — Ambulatory Visit (INDEPENDENT_AMBULATORY_CARE_PROVIDER_SITE_OTHER): Payer: Medicare Other | Admitting: *Deleted

## 2014-01-12 VITALS — BP 108/69 | HR 91 | Temp 97.3°F | Ht 63.0 in | Wt 147.7 lb

## 2014-01-12 DIAGNOSIS — I639 Cerebral infarction, unspecified: Secondary | ICD-10-CM

## 2014-01-12 DIAGNOSIS — C519 Malignant neoplasm of vulva, unspecified: Secondary | ICD-10-CM

## 2014-01-12 DIAGNOSIS — I6350 Cerebral infarction due to unspecified occlusion or stenosis of unspecified cerebral artery: Secondary | ICD-10-CM

## 2014-01-12 DIAGNOSIS — I4891 Unspecified atrial fibrillation: Secondary | ICD-10-CM

## 2014-01-12 LAB — POCT INR: INR: 3

## 2014-01-12 NOTE — Progress Notes (Signed)
[name] here using a walker for follow up after treatment to her perineum area and lower pelvis.  She denies pain, vaginal/rectal bleeding and diarrhea.  She says that her skin in the treatment area is healed and that she uses silvadene at night.  She reports fatigue.

## 2014-01-12 NOTE — Progress Notes (Signed)
Radiation Oncology         [phone] ________________________________  Name: [name] MRN: [id number]  Date: 01/12/2014  DOB: 05-26-1926  Follow-Up Visit Note  CC: Elayne Snare, MD  Marti Sleigh *  Diagnosis:   Recurrent vulvar carcinoma  Interval Since Last Radiation:  6  weeks  Narrative:  The patient returns today for routine follow-up.  She continues to make good progress. Her fatigue has improved since last followup. She denies any obvious bleeding from the perineum. She has nocturia x3. She has had some mild constipation and will start using a stool softener for this issue.   She feels she can empty her bladder.                           ALLERGIES:  is allergic to tenex and crestor.  Meds: Current Outpatient Prescriptions  Medication Sig Dispense Refill  . atorvastatin (LIPITOR) 20 MG tablet Take 1 tablet (20 mg total) by mouth at bedtime.  30 tablet  6  . diltiazem (CARDIZEM CD) 360 MG 24 hr capsule Take 180 mg by mouth daily.       . furosemide (LASIX) 40 MG tablet Take 20 mg by mouth daily.       [name] glucose blood (ONE TOUCH TEST STRIPS) test strip Use as instructed to check blood sugars 4 times per day dx code 250.00  150 each  12  . HUMALOG KWIKPEN 100 UNIT/ML SOPN INJECT 14 TO 28 UNITS UNDER THE SKIN THREE TIMES A DAY WITH MEALS (16 UNITS AT BREAKFAST, 22 UNITS AT LUNCH, AND 28 UNITS AT SUPPER)  10 pen  4  . insulin detemir (LEVEMIR) 100 UNIT/ML injection Inject 24 Units into the skin at bedtime. 14 in am and 20 in pm      . levothyroxine (SYNTHROID, LEVOTHROID) 100 MCG tablet TAKE 1 TABLET EVERY DAY BUT SUNDAY  90 tablet  2  . metoprolol succinate (TOPROL-XL) 25 MG 24 hr tablet Take 25 mg by mouth 2 (two) times daily.       . potassium chloride (KLOR-CON M10) 10 MEQ tablet Take 1 tablet (10 mEq total) by mouth daily.  30 tablet  5  . silver sulfADIAZINE (SILVADENE) 1 % cream Apply 1 application topically daily.      [name] HYDROcodone-acetaminophen  (NORCO/VICODIN) 5-325 MG per tablet Take 1 tablet by mouth every 6 (six) hours as needed for moderate pain.  30 tablet  0  . warfarin (COUMADIN) 5 MG tablet Take 2.5-5 mg by mouth daily. Takes 5mg  daily except 2.5mg  on Monday, Wednesdays andFridays       No current facility-administered medications for this encounter.    Physical Findings: The patient is in no acute distress. Patient is alert and oriented.  height is 5\' 3"  (1.6 m) and weight is 147 lb 11.2 oz (66.996 kg). Her temperature is 97.3 F (36.3 C). Her blood pressure is 108/69 and her pulse is 91. Her oxygen saturation is 96%. .  No palpable supraclavicular adenopathy. The lungs are clear to auscultation.  The heart has an irregular rhythm consistent with atrial fibrillation. A limited pelvic exam is performed. The patient's skin is healed well. Patient may have residual tumor but exam is very uncomfortable for the patient and I did not push this issue.  Lab Findings: Lab Results  Component Value Date   WBC 7.8 09/24/2013   HGB 13.0 09/24/2013   HCT 39.4 09/24/2013   MCV 98.3  09/24/2013   PLT 157.0 09/24/2013      Radiographic Findings: No results found.  Impression:  The patient is recovering from the effects of radiation.    Plan:  Routine followup in 3 months. Patient will be seen by gynecologic oncology  next  week for a more detailed exam.  ____________________________________ Blair Promise, MD

## 2014-01-19 ENCOUNTER — Encounter: Payer: Self-pay | Admitting: Gynecology

## 2014-01-19 ENCOUNTER — Ambulatory Visit: Payer: Medicare Other | Attending: Gynecology | Admitting: Gynecology

## 2014-01-19 VITALS — BP 111/83 | HR 120 | Temp 97.9°F | Wt 146.7 lb

## 2014-01-19 DIAGNOSIS — E119 Type 2 diabetes mellitus without complications: Secondary | ICD-10-CM | POA: Insufficient documentation

## 2014-01-19 DIAGNOSIS — Z79899 Other long term (current) drug therapy: Secondary | ICD-10-CM | POA: Insufficient documentation

## 2014-01-19 DIAGNOSIS — Z7901 Long term (current) use of anticoagulants: Secondary | ICD-10-CM | POA: Insufficient documentation

## 2014-01-19 DIAGNOSIS — Z87891 Personal history of nicotine dependence: Secondary | ICD-10-CM | POA: Insufficient documentation

## 2014-01-19 DIAGNOSIS — Z8673 Personal history of transient ischemic attack (TIA), and cerebral infarction without residual deficits: Secondary | ICD-10-CM | POA: Insufficient documentation

## 2014-01-19 DIAGNOSIS — E669 Obesity, unspecified: Secondary | ICD-10-CM | POA: Insufficient documentation

## 2014-01-19 DIAGNOSIS — E785 Hyperlipidemia, unspecified: Secondary | ICD-10-CM | POA: Insufficient documentation

## 2014-01-19 DIAGNOSIS — I4891 Unspecified atrial fibrillation: Secondary | ICD-10-CM | POA: Insufficient documentation

## 2014-01-19 DIAGNOSIS — Z923 Personal history of irradiation: Secondary | ICD-10-CM | POA: Insufficient documentation

## 2014-01-19 DIAGNOSIS — L98499 Non-pressure chronic ulcer of skin of other sites with unspecified severity: Secondary | ICD-10-CM | POA: Insufficient documentation

## 2014-01-19 DIAGNOSIS — C519 Malignant neoplasm of vulva, unspecified: Secondary | ICD-10-CM

## 2014-01-19 DIAGNOSIS — Z794 Long term (current) use of insulin: Secondary | ICD-10-CM | POA: Insufficient documentation

## 2014-01-19 NOTE — Progress Notes (Signed)
Consult Note: Gyn-Onc   [name] 80 y.o. female  Chief Complaint  Patient presents with  . 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 She is asymptomatic. There is a healing ulcer in the perianal area..  Plan: Patient return to see me in 2-3 months  for further evaluation.Carlene Coria continue her current regimen of vulvar hygiene.  Interval History: Patient returns today as previously scheduled. Since her last visit she's had resolution of the vulvar pain. She is no longer using Silvadene cream. She denies any discharge or bleeding. She denies any difficulty with bowel movements and is not having significant pain.   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 At that time she underwent a modified radical vulvectomy and right inguinal lymphadenectomy. Lymph nodes and surgical margins were negative. She subsequently developed a recurrence on the left vulva in October 2008 underwent left modified radical vulvectomy. Given her age and overall poor performance status we did not perform an inguinal lymphadenectomy.  The patient subsequently had a third recurrence of October 2009 and this was excised and the rhomboid flap was used to fill the surgical defect. Surgical margins were again negative. Her most recent recurrences in September 2012 which is again excised.  She had a superficial wound separation which closed by secondary intent.   She had yet another recurrence in November 2014 treated with external beam radiation therapy because of the lesion location very close to the anus.     Review of System Ten point review of systems is negative except as noted above      Allergies  Allergen Reactions  . Tenex [Guanfacine Hcl] Other (See Comments)    hypotension  . Crestor [Rosuvastatin] Other (See Comments)    Myalgia    Past Medical History  Diagnosis Date  . Diabetes mellitus   .  Hypertension   . Hyperlipidemia   . Obesity   . Hypothyroidism   . Vulvar cancer, carcinoma 11/03/2011  . Stroke 05/2000    right brain CVA pre H&P 2001  . Atrial fibrillation   . Cancer 2007    vulva-invasive well diff squamous cell ca  . CVA (cerebral infarction)   . Dyslipidemia   . History of radiation therapy 10/20/2013-12/01/2013    60 gray to perineum area and lower pelvis    Past Surgical History  Procedure Laterality Date  . Vulvar lesion removal  2012  . Breast surgery      Lumpectomy  . Dilation and curettage of uterus    . Cystourethroplasty / ureteroneocystostomy    . Radical vulvectomy    . Lymphadenectomy  2007    R inguinal femoral  . Radical vulvectomy  02/27/12  . Vulvar lesion removal  02/27/2012    Procedure: VULVAR LESION;  Surgeon: Alvino Chapel, MD;  Location: [facility];  Service: Gynecology;  Laterality: N/A;  . Right carotid endarterctomy    . Left eye surgery      , permanently stitched eye together    Current Outpatient Prescriptions  Medication Sig Dispense Refill  . atorvastatin (LIPITOR) 20 MG tablet Take 1 tablet (20 mg total) by mouth at bedtime.  30 tablet  6  . diltiazem (CARDIZEM CD) 360 MG 24 hr capsule Take 180 mg by mouth daily.       . furosemide (LASIX) 40 MG tablet Take 20 mg by mouth daily.       [name] glucose  blood (ONE TOUCH TEST STRIPS) test strip Use as instructed to check blood sugars 4 times per day dx code 250.00  150 each  12  . HUMALOG KWIKPEN 100 UNIT/ML SOPN INJECT 14 TO 28 UNITS UNDER THE SKIN THREE TIMES A DAY WITH MEALS (16 UNITS AT BREAKFAST, 22 UNITS AT LUNCH, AND 28 UNITS AT SUPPER)  10 pen  4  . HYDROcodone-acetaminophen (NORCO/VICODIN) 5-325 MG per tablet Take 1 tablet by mouth every 6 (six) hours as needed for moderate pain.  30 tablet  0  . insulin detemir (LEVEMIR) 100 UNIT/ML injection Inject 24 Units into the skin at bedtime. 14 in am and 20 in pm      . levothyroxine (SYNTHROID, LEVOTHROID) 100 MCG tablet TAKE  1 TABLET EVERY DAY BUT SUNDAY  90 tablet  2  . metoprolol succinate (TOPROL-XL) 25 MG 24 hr tablet Take 25 mg by mouth 2 (two) times daily.       . potassium chloride (KLOR-CON M10) 10 MEQ tablet Take 1 tablet (10 mEq total) by mouth daily.  30 tablet  5  . silver sulfADIAZINE (SILVADENE) 1 % cream Apply 1 application topically daily.      [name] warfarin (COUMADIN) 5 MG tablet Take 2.5-5 mg by mouth daily. Takes 5mg  daily except 2.5mg  on Monday, Wednesdays andFridays       No current facility-administered medications for this visit.    History   Social History  . Marital Status: Married    Spouse Name: N/A    Number of Children: N/A  . Years of Education: N/A   Occupational History  . Not on file.   Social History Main Topics  . Smoking status: Former Smoker -- 0.25 packs/day for 18 years    Types: Cigarettes    Quit date: 11/03/1967  . Smokeless tobacco: Not on file  . Alcohol Use: No  . Drug Use: No  . Sexual Activity: Not Currently   Other Topics Concern  . Not on file   Social History Narrative  . No narrative on file    Family History  Problem Relation Age of Onset  . Diabetes type II    . Coronary artery disease Mother   . Lung cancer Brother   . Diabetes type II Brother        Vitals: Blood pressure 111/83, pulse 120, temperature 97.9 F (36.6 C), temperature source Oral, weight 146 lb 11.2 oz (66.543 kg).  Physical Exam: In general the patient is a pleasant elderly white female no acute distress.  HEENT is negative except that her left eyelid has been partially closed surgically.. Neck is supple without thyromegaly.  There is no supraclavicular or inguinal adenopathy.  The abdomen is soft nontender no masses organomegaly or ascites is noted.  Pelvic exam EGBUS is status post radical vulvectomy with advancement flaps. There is an ulcerated area on the left perianal region where the recurrence recently was treated with radiation therapy. Certainly the  exophytic portion of the tumor has resolved. At the present time, I presume this is still recovering from radiation therapy.     Alvino Chapel, MD 01/19/2014, 10:14 AM                         Consult Note: Gyn-Onc   [name] 80 y.o. female  Chief Complaint  Patient presents with  . Vulvar Cancer    Interval History:   HPI:  Allergies  Allergen Reactions  . Tenex [  Guanfacine Hcl] Other (See Comments)    hypotension  . Crestor [Rosuvastatin] Other (See Comments)    Myalgia    Past Medical History  Diagnosis Date  . Diabetes mellitus   . Hypertension   . Hyperlipidemia   . Obesity   . Hypothyroidism   . Vulvar cancer, carcinoma 11/03/2011  . Stroke 05/2000    right brain CVA pre H&P 2001  . Atrial fibrillation   . Cancer 2007    vulva-invasive well diff squamous cell ca  . CVA (cerebral infarction)   . Dyslipidemia   . History of radiation therapy 10/20/2013-12/01/2013    60 gray to perineum area and lower pelvis    Past Surgical History  Procedure Laterality Date  . Vulvar lesion removal  2012  . Breast surgery      Lumpectomy  . Dilation and curettage of uterus    . Cystourethroplasty / ureteroneocystostomy    . Radical vulvectomy    . Lymphadenectomy  2007    R inguinal femoral  . Radical vulvectomy  02/27/12  . Vulvar lesion removal  02/27/2012    Procedure: VULVAR LESION;  Surgeon: Alvino Chapel, MD;  Location: [facility];  Service: Gynecology;  Laterality: N/A;  . Right carotid endarterctomy    . Left eye surgery      , permanently stitched eye together    Current Outpatient Prescriptions  Medication Sig Dispense Refill  . atorvastatin (LIPITOR) 20 MG tablet Take 1 tablet (20 mg total) by mouth at bedtime.  30 tablet  6  . diltiazem (CARDIZEM CD) 360 MG 24 hr capsule Take 180 mg by mouth daily.       . furosemide (LASIX) 40 MG tablet Take 20 mg by mouth daily.       [name] glucose blood (ONE TOUCH TEST STRIPS)  test strip Use as instructed to check blood sugars 4 times per day dx code 250.00  150 each  12  . HUMALOG KWIKPEN 100 UNIT/ML SOPN INJECT 14 TO 28 UNITS UNDER THE SKIN THREE TIMES A DAY WITH MEALS (16 UNITS AT BREAKFAST, 22 UNITS AT LUNCH, AND 28 UNITS AT SUPPER)  10 pen  4  . HYDROcodone-acetaminophen (NORCO/VICODIN) 5-325 MG per tablet Take 1 tablet by mouth every 6 (six) hours as needed for moderate pain.  30 tablet  0  . insulin detemir (LEVEMIR) 100 UNIT/ML injection Inject 24 Units into the skin at bedtime. 14 in am and 20 in pm      . levothyroxine (SYNTHROID, LEVOTHROID) 100 MCG tablet TAKE 1 TABLET EVERY DAY BUT SUNDAY  90 tablet  2  . metoprolol succinate (TOPROL-XL) 25 MG 24 hr tablet Take 25 mg by mouth 2 (two) times daily.       . potassium chloride (KLOR-CON M10) 10 MEQ tablet Take 1 tablet (10 mEq total) by mouth daily.  30 tablet  5  . silver sulfADIAZINE (SILVADENE) 1 % cream Apply 1 application topically daily.      [name] warfarin (COUMADIN) 5 MG tablet Take 2.5-5 mg by mouth daily. Takes 5mg  daily except 2.5mg  on Monday, Wednesdays andFridays       No current facility-administered medications for this visit.    History   Social History  . Marital Status: Married    Spouse Name: N/A    Number of Children: N/A  . Years of Education: N/A   Occupational History  . Not on file.   Social History Main Topics  . Smoking status: Former Smoker --  0.25 packs/day for 18 years    Types: Cigarettes    Quit date: 11/03/1967  . Smokeless tobacco: Not on file  . Alcohol Use: No  . Drug Use: No  . Sexual Activity: Not Currently   Other Topics Concern  . Not on file   Social History Narrative  . No narrative on file    Family History  Problem Relation Age of Onset  . Diabetes type II    . Coronary artery disease Mother   . Lung cancer Brother   . Diabetes type II Brother     Review of Systems:  Vitals: Blood pressure 111/83, pulse 120, temperature 97.9 F (36.6 C),  temperature source Oral, weight 146 lb 11.2 oz (66.543 kg).  Physical Exam:  Assessment/Plan:   Jeannette Corpus, MD 01/19/2014, 10:14 AM                         Consult Note: Gyn-Onc   [name] 80 y.o. female  Chief Complaint  Patient presents with  . Vulvar Cancer    Interval History: The patient returns today  last being seen in April 2013. Subsequent to that visit, she's had a number of medical problems including a stroke, atrial fibrillation, and some ophthalmologic problems. On direct questioning the patient does admit and her husband concurs that there is a new lesion on her vulva. She says it "stings". She has not had any bleeding.  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 At that time she underwent a modified radical vulvectomy and right inguinal lymphadenectomy. Lymph nodes and surgical margins were negative. She subsequently developed a recurrence on the left vulva in October 2008 underwent left modified radical vulvectomy. Given her age and overall poor performance status we did not perform an inguinal lymphadenectomy.  The patient subsequently had a third recurrence of October 2009 and this was excised and the rhomboid flap was used to fill the surgical defect. Surgical margins were again negative. Her most recent recurrences in September 2012 which is again excised.  She had a superficial wound separation which closed by secondary intent.      Review of System Ten point review of systems is negative except as noted above      Allergies  Allergen Reactions  . Tenex [Guanfacine Hcl] Other (See Comments)    hypotension  . Crestor [Rosuvastatin] Other (See Comments)    Myalgia    Past Medical History  Diagnosis Date  . Diabetes mellitus   . Hypertension   . Hyperlipidemia   . Obesity   . Hypothyroidism   . Vulvar cancer, carcinoma 11/03/2011  . Stroke 05/2000    right brain CVA pre H&P 2001   . Atrial fibrillation   . Cancer 2007    vulva-invasive well diff squamous cell ca  . CVA (cerebral infarction)   . Dyslipidemia   . History of radiation therapy 10/20/2013-12/01/2013    60 gray to perineum area and lower pelvis    Past Surgical History  Procedure Laterality Date  . Vulvar lesion removal  2012  . Breast surgery      Lumpectomy  . Dilation and curettage of uterus    . Cystourethroplasty / ureteroneocystostomy    . Radical vulvectomy    . Lymphadenectomy  2007    R inguinal femoral  . Radical vulvectomy  02/27/12  . Vulvar lesion removal  02/27/2012    Procedure: VULVAR LESION;  Surgeon: Reuel Boom  Doylene Bode, MD;  Location: [facility];  Service: Gynecology;  Laterality: N/A;  . Right carotid endarterctomy    . Left eye surgery      , permanently stitched eye together    Current Outpatient Prescriptions  Medication Sig Dispense Refill  . atorvastatin (LIPITOR) 20 MG tablet Take 1 tablet (20 mg total) by mouth at bedtime.  30 tablet  6  . diltiazem (CARDIZEM CD) 360 MG 24 hr capsule Take 180 mg by mouth daily.       . furosemide (LASIX) 40 MG tablet Take 20 mg by mouth daily.       [name] glucose blood (ONE TOUCH TEST STRIPS) test strip Use as instructed to check blood sugars 4 times per day dx code 250.00  150 each  12  . HUMALOG KWIKPEN 100 UNIT/ML SOPN INJECT 14 TO 28 UNITS UNDER THE SKIN THREE TIMES A DAY WITH MEALS (16 UNITS AT BREAKFAST, 22 UNITS AT LUNCH, AND 28 UNITS AT SUPPER)  10 pen  4  . HYDROcodone-acetaminophen (NORCO/VICODIN) 5-325 MG per tablet Take 1 tablet by mouth every 6 (six) hours as needed for moderate pain.  30 tablet  0  . insulin detemir (LEVEMIR) 100 UNIT/ML injection Inject 24 Units into the skin at bedtime. 14 in am and 20 in pm      . levothyroxine (SYNTHROID, LEVOTHROID) 100 MCG tablet TAKE 1 TABLET EVERY DAY BUT SUNDAY  90 tablet  2  . metoprolol succinate (TOPROL-XL) 25 MG 24 hr tablet Take 25 mg by mouth 2 (two) times daily.       .  potassium chloride (KLOR-CON M10) 10 MEQ tablet Take 1 tablet (10 mEq total) by mouth daily.  30 tablet  5  . silver sulfADIAZINE (SILVADENE) 1 % cream Apply 1 application topically daily.      [name] warfarin (COUMADIN) 5 MG tablet Take 2.5-5 mg by mouth daily. Takes 5mg  daily except 2.5mg  on Monday, Wednesdays andFridays       No current facility-administered medications for this visit.    History   Social History  . Marital Status: Married    Spouse Name: N/A    Number of Children: N/A  . Years of Education: N/A   Occupational History  . Not on file.   Social History Main Topics  . Smoking status: Former Smoker -- 0.25 packs/day for 18 years    Types: Cigarettes    Quit date: 11/03/1967  . Smokeless tobacco: Not on file  . Alcohol Use: No  . Drug Use: No  . Sexual Activity: Not Currently   Other Topics Concern  . Not on file   Social History Narrative  . No narrative on file    Family History  Problem Relation Age of Onset  . Diabetes type II    . Coronary artery disease Mother   . Lung cancer Brother   . Diabetes type II Brother        Vitals: Blood pressure 111/83, pulse 120, temperature 97.9 F (36.6 C), temperature source Oral, weight 146 lb 11.2 oz (66.543 kg).  Physical Exam: In general the patient is a pleasant elderly white female no acute distress.  HEENT is negative except that her left eyelid has been partially closed surgically.. Neck is supple without thyromegaly.  There is no supraclavicular or inguinal adenopathy.  The abdomen is soft nontender no masses organomegaly or ascites is noted.  Pelvic exam EGBUS is status post radical vulvectomy with advancement flaps. She now has a  new 4 cm fungating lesion just to the left of the anus. Assessment/Plan:recurrent vulvar cancer adjacent to the anus.   Given the location of this new lesion, I do not believe that surgery would be appropriate as the patient would require an extensive operation including  resection of the anal sphincter colostomy. Therefore would like to refer her to radiation oncology for consideration of external beam radiation therapy to this lesion. The patient and her husband are in agreement with this plan.   Alvino Chapel, MD 01/19/2014, 10:14 AM                         Consult Note: Gyn-Onc   [name] 80 y.o. female  Chief Complaint  Patient presents with  . Vulvar Cancer    Interval History:   HPI:  Allergies  Allergen Reactions  . Tenex [Guanfacine Hcl] Other (See Comments)    hypotension  . Crestor [Rosuvastatin] Other (See Comments)    Myalgia    Past Medical History  Diagnosis Date  . Diabetes mellitus   . Hypertension   . Hyperlipidemia   . Obesity   . Hypothyroidism   . Vulvar cancer, carcinoma 11/03/2011  . Stroke 05/2000    right brain CVA pre H&P 2001  . Atrial fibrillation   . Cancer 2007    vulva-invasive well diff squamous cell ca  . CVA (cerebral infarction)   . Dyslipidemia   . History of radiation therapy 10/20/2013-12/01/2013    60 gray to perineum area and lower pelvis    Past Surgical History  Procedure Laterality Date  . Vulvar lesion removal  2012  . Breast surgery      Lumpectomy  . Dilation and curettage of uterus    . Cystourethroplasty / ureteroneocystostomy    . Radical vulvectomy    . Lymphadenectomy  2007    R inguinal femoral  . Radical vulvectomy  02/27/12  . Vulvar lesion removal  02/27/2012    Procedure: VULVAR LESION;  Surgeon: Alvino Chapel, MD;  Location: [facility];  Service: Gynecology;  Laterality: N/A;  . Right carotid endarterctomy    . Left eye surgery      , permanently stitched eye together    Current Outpatient Prescriptions  Medication Sig Dispense Refill  . atorvastatin (LIPITOR) 20 MG tablet Take 1 tablet (20 mg total) by mouth at bedtime.  30 tablet  6  . diltiazem (CARDIZEM CD) 360 MG 24 hr capsule Take 180 mg by mouth daily.       .  furosemide (LASIX) 40 MG tablet Take 20 mg by mouth daily.       [name] glucose blood (ONE TOUCH TEST STRIPS) test strip Use as instructed to check blood sugars 4 times per day dx code 250.00  150 each  12  . HUMALOG KWIKPEN 100 UNIT/ML SOPN INJECT 14 TO 28 UNITS UNDER THE SKIN THREE TIMES A DAY WITH MEALS (16 UNITS AT BREAKFAST, 22 UNITS AT LUNCH, AND 28 UNITS AT SUPPER)  10 pen  4  . HYDROcodone-acetaminophen (NORCO/VICODIN) 5-325 MG per tablet Take 1 tablet by mouth every 6 (six) hours as needed for moderate pain.  30 tablet  0  . insulin detemir (LEVEMIR) 100 UNIT/ML injection Inject 24 Units into the skin at bedtime. 14 in am and 20 in pm      . levothyroxine (SYNTHROID, LEVOTHROID) 100 MCG tablet TAKE 1 TABLET EVERY DAY BUT SUNDAY  90 tablet  2  .  metoprolol succinate (TOPROL-XL) 25 MG 24 hr tablet Take 25 mg by mouth 2 (two) times daily.       . potassium chloride (KLOR-CON M10) 10 MEQ tablet Take 1 tablet (10 mEq total) by mouth daily.  30 tablet  5  . silver sulfADIAZINE (SILVADENE) 1 % cream Apply 1 application topically daily.      [name] warfarin (COUMADIN) 5 MG tablet Take 2.5-5 mg by mouth daily. Takes 5mg  daily except 2.5mg  on Monday, Wednesdays andFridays       No current facility-administered medications for this visit.    History   Social History  . Marital Status: Married    Spouse Name: N/A    Number of Children: N/A  . Years of Education: N/A   Occupational History  . Not on file.   Social History Main Topics  . Smoking status: Former Smoker -- 0.25 packs/day for 18 years    Types: Cigarettes    Quit date: 11/03/1967  . Smokeless tobacco: Not on file  . Alcohol Use: No  . Drug Use: No  . Sexual Activity: Not Currently   Other Topics Concern  . Not on file   Social History Narrative  . No narrative on file    Family History  Problem Relation Age of Onset  . Diabetes type II    . Coronary artery disease Mother   . Lung cancer Brother   . Diabetes type II Brother      Review of Systems:  Vitals: Blood pressure 111/83, pulse 120, temperature 97.9 F (36.6 C), temperature source Oral, weight 146 lb 11.2 oz (66.543 kg).  Physical Exam:  Assessment/Plan:   [name],Osmin Welz L, MD 01/19/2014, 10:14 AM

## 2014-01-19 NOTE — Patient Instructions (Addendum)
Follow up in 2-3 months. Call with any concerns.

## 2014-01-30 ENCOUNTER — Other Ambulatory Visit: Payer: Self-pay | Admitting: Pharmacist

## 2014-01-30 ENCOUNTER — Other Ambulatory Visit: Payer: Self-pay

## 2014-01-30 MED ORDER — POTASSIUM CHLORIDE CRYS ER 10 MEQ PO TBCR: 10.0000 meq | EXTENDED_RELEASE_TABLET | Freq: Every day | ORAL | Status: DC

## 2014-01-30 MED ORDER — WARFARIN SODIUM 5 MG PO TABS: ORAL_TABLET | ORAL | Status: DC

## 2014-02-02 ENCOUNTER — Other Ambulatory Visit: Payer: Self-pay

## 2014-02-02 ENCOUNTER — Ambulatory Visit (INDEPENDENT_AMBULATORY_CARE_PROVIDER_SITE_OTHER): Payer: Medicare Other | Admitting: Endocrinology

## 2014-02-02 ENCOUNTER — Encounter: Payer: Self-pay | Admitting: Endocrinology

## 2014-02-02 VITALS — BP 110/62 | HR 99 | Temp 98.2°F | Resp 18 | Ht 64.0 in | Wt 145.6 lb

## 2014-02-02 DIAGNOSIS — E1165 Type 2 diabetes mellitus with hyperglycemia: Principal | ICD-10-CM

## 2014-02-02 DIAGNOSIS — E039 Hypothyroidism, unspecified: Secondary | ICD-10-CM

## 2014-02-02 DIAGNOSIS — N183 Chronic kidney disease, stage 3 unspecified: Secondary | ICD-10-CM

## 2014-02-02 DIAGNOSIS — E1129 Type 2 diabetes mellitus with other diabetic kidney complication: Secondary | ICD-10-CM

## 2014-02-02 NOTE — Patient Instructions (Addendum)
Levemir 30 in the morning and 15 in the evening  Humalog 20-- 25--30 AC  Call if sugar > 300 for 3 days in a row  Have a protein with Bfst daily

## 2014-02-02 NOTE — Progress Notes (Signed)
Patient ID: [name], female   DOB: Feb 11, 1926, 80 y.o.   MRN: [id number]   Reason for Appointment: Diabetes follow-up   History of Present Illness   Diagnosis: Type 2 DIABETES MELITUS, long-standing  Previous history: She has been on insulin for several years and usually is compliant with her basal bolus regimen. However she usually has significant fluctuation in her blood sugars and difficult to have consistent trend. Overall blood sugars are usually high after meals  Insulin regimen: Levemir 24 in the morning and 18 in the evening, Humalog 16-- 22--28 [facility]    RECENT history:   The patient's blood sugars are again quite erratic and overall slightly higher. Despite her trying to be compliant with her insulin doses as directed as well as taking her Humalog right before eating she has significantly high readings throughout the day. However sporadically she will have low normal blood sugars especially in the mornings and late evenings Most of her sugars appear to be higher midday and afternoon with the median blood sugar is over 200 throughout the day except after supper Has been taking she is watching her diet although sometimes made cereal at breakfast She has not been tried on Lantus insulin but currently has a large supply of Levemir A1c has been consistently over 8%   Proper timing of medications in relation to meals: Yes.          Monitors blood glucose:  3 times a day.    Glucometer: One Touch.          Blood Glucose readings from meter download:   PREMEAL Breakfast Lunch Dinner Bedtime Overall  Glucose range:  69- 301   111-320   128-339   123-404    Mean/median:  165   243     212    208    POST-MEAL PC Breakfast PC Lunch PC Dinner  Glucose range:   167-258   216-337    85-264    Mean/median:     179   Hypoglycemia:  minimal with lowest glucose 69  Meals: 3 meals per day.  for breakfast he will have an egg and toast but sometimes cereal. Suppertime about 5:30 p.m.    Physical  activity:  minimal           Complications: are: Nephropathy    Eye exams: Have been regular   Wt Readings from Last 3 Encounters:  02/02/14 145 lb 9.6 oz (66.044 kg)  01/19/14 146 lb 11.2 oz (66.543 kg)  01/12/14 147 lb 11.2 oz (66.996 kg)   DIABETES labs  Lab Results  Component Value Date   HGBA1C 8.9* 11/24/2013   HGBA1C 9.5* 07/29/2013   HGBA1C 6.7* 08/10/2012   Lab Results  Component Value Date   MICROALBUR 7.2* 11/24/2013   LDLCALC 54 11/24/2013   CREATININE 1.6* 11/24/2013      Medication List       This list is accurate as of: 02/02/14 10:31 AM.  Always use your most recent med list.               atorvastatin 20 MG tablet  Commonly known as:  LIPITOR  Take 1 tablet (20 mg total) by mouth at bedtime.     diltiazem 360 MG 24 hr capsule  Commonly known as:  CARDIZEM CD  Take 180 mg by mouth daily.     furosemide 40 MG tablet  Commonly known as:  LASIX  Take 20 mg by mouth daily.  glucose blood test strip  Commonly known as:  ONE TOUCH TEST STRIPS  Use as instructed to check blood sugars 4 times per day dx code 250.00     HUMALOG KWIKPEN 100 UNIT/ML KiwkPen  Generic drug:  insulin lispro  INJECT 14 TO 28 UNITS UNDER THE SKIN THREE TIMES A DAY WITH MEALS (16 UNITS AT BREAKFAST, 22 UNITS AT LUNCH, AND 30 UNITS AT SUPPER)     insulin detemir 100 UNIT/ML injection  Commonly known as:  LEVEMIR  Inject 24 Units into the skin at bedtime. 24 in am and 16 in pm     levothyroxine 100 MCG tablet  Commonly known as:  SYNTHROID, LEVOTHROID  TAKE 1 TABLET EVERY DAY BUT SUNDAY     metoprolol succinate 25 MG 24 hr tablet  Commonly known as:  TOPROL-XL  Take 25 mg by mouth 2 (two) times daily.     potassium chloride 10 MEQ tablet  Commonly known as:  KLOR-CON M10  Take 1 tablet (10 mEq total) by mouth daily.     silver sulfADIAZINE 1 % cream  Commonly known as:  SILVADENE  Apply 1 application topically daily.     warfarin 5 MG tablet  Commonly known as:   COUMADIN  Take as directed by Anticoagulation clinic        Allergies:  Allergies  Allergen Reactions  . Tenex [Guanfacine Hcl] Other (See Comments)    hypotension  . Crestor [Rosuvastatin] Other (See Comments)    Myalgia    Past Medical History  Diagnosis Date  . Diabetes mellitus   . Hypertension   . Hyperlipidemia   . Obesity   . Hypothyroidism   . Vulvar cancer, carcinoma 11/03/2011  . Stroke 05/2000    right brain CVA pre H&P 2001  . Atrial fibrillation   . Cancer 2007    vulva-invasive well diff squamous cell ca  . CVA (cerebral infarction)   . Dyslipidemia   . History of radiation therapy 10/20/2013-12/01/2013    60 gray to perineum area and lower pelvis    Past Surgical History  Procedure Laterality Date  . Vulvar lesion removal  2012  . Breast surgery      Lumpectomy  . Dilation and curettage of uterus    . Cystourethroplasty / ureteroneocystostomy    . Radical vulvectomy    . Lymphadenectomy  2007    R inguinal femoral  . Radical vulvectomy  02/27/12  . Vulvar lesion removal  02/27/2012    Procedure: VULVAR LESION;  Surgeon: Alvino Chapel, MD;  Location: [facility];  Service: Gynecology;  Laterality: N/A;  . Right carotid endarterctomy    . Left eye surgery      , permanently stitched eye together    Family History  Problem Relation Age of Onset  . Diabetes type II    . Coronary artery disease Mother   . Lung cancer Brother   . Diabetes type II Brother     Social History:  reports that she quit smoking about 46 years ago. Her smoking use included Cigarettes. She has a 4.5 pack-year smoking history. She does not have any smokeless tobacco history on file. She reports that she does not drink alcohol or use illicit drugs.  Review of Systems:  HYPERTENSION:  her blood pressure  as well  controlled with diltiazem and metoprolol, also being used for heart  rate control. Blood pressure tends to be low normal without lightheadedness.  She has a  history of atrial  fibrillation, taking Coumadin. Does not have a followup with cardiologist scheduled   HYPERLIPIDEMIA: well-controlled  She is still having problems with her left eye and scarring of her eyelid, also decreased vision in the left   She has a long-standing history of hypothyroidism   Lab Results  Component Value Date   TSH 2.67 09/24/2013      Chronic kidney disease/nephropathy: Has had mild increase in creatinine  which is stable , has history of proteinuria but microalbumin  more recently  normal   Last foot exam: 11/2013 No significant neuropathy on exam but she has decreased pulses    Examination:   BP 110/62  Pulse 99  Temp(Src) 98.2 F (36.8 C)  Resp 18  Ht 5\' 4"  (1.626 m)  Wt 145 lb 9.6 oz (66.044 kg)  BMI 24.98 kg/m2  SpO2 91%  Body mass index is 24.98 kg/(m^2).    ASSESSMENT/ PLAN::   Diabetes type 2   The patient's diabetes control appears to be inadequate although reasonably good for her age 80 of her hyperglycemia her appears to be during the midday and afternoon indicating need for higher basal dose during the day   Will increase her LEVEMIR in the morning by 6 units and consider reducing evening dose  Again will  increase her suppertime coverage by 2 units, written instructions given  She will try to have protein at breakfast consistently  Followup in one month and she will call if she has any unusually high or low readings  CKD: We will continue to monitor. Check PTH also on next visit    [name] 02/02/2014, 10:31 AM

## 2014-02-11 ENCOUNTER — Ambulatory Visit (INDEPENDENT_AMBULATORY_CARE_PROVIDER_SITE_OTHER): Payer: Medicare Other | Admitting: *Deleted

## 2014-02-11 DIAGNOSIS — I639 Cerebral infarction, unspecified: Secondary | ICD-10-CM

## 2014-02-11 DIAGNOSIS — I6350 Cerebral infarction due to unspecified occlusion or stenosis of unspecified cerebral artery: Secondary | ICD-10-CM

## 2014-02-11 DIAGNOSIS — I4891 Unspecified atrial fibrillation: Secondary | ICD-10-CM

## 2014-02-11 LAB — POCT INR: INR: 3.2

## 2014-02-13 ENCOUNTER — Telehealth: Payer: Self-pay | Admitting: *Deleted

## 2014-02-13 NOTE — Telephone Encounter (Signed)
Patient called, she said you wanted her to call if her sugars ran over 300 for 3 days in a row, she said her readings have been 17th @ 12:28 pm 326 18th @ 8:45 pm 387 19th @ 8:30 am 387

## 2014-02-13 NOTE — Telephone Encounter (Signed)
Please get all blood sugars done in the last 3 days, sorted by time of day

## 2014-02-16 ENCOUNTER — Other Ambulatory Visit: Payer: Self-pay | Admitting: Endocrinology

## 2014-02-18 NOTE — Telephone Encounter (Signed)
Please update

## 2014-03-04 ENCOUNTER — Ambulatory Visit (INDEPENDENT_AMBULATORY_CARE_PROVIDER_SITE_OTHER): Payer: Medicare Other | Admitting: Endocrinology

## 2014-03-04 ENCOUNTER — Other Ambulatory Visit (INDEPENDENT_AMBULATORY_CARE_PROVIDER_SITE_OTHER): Payer: Medicare Other

## 2014-03-04 ENCOUNTER — Encounter: Payer: Self-pay | Admitting: Endocrinology

## 2014-03-04 ENCOUNTER — Ambulatory Visit (INDEPENDENT_AMBULATORY_CARE_PROVIDER_SITE_OTHER): Payer: Medicare Other | Admitting: *Deleted

## 2014-03-04 VITALS — BP 108/60 | HR 98 | Temp 98.3°F | Resp 16 | Ht 64.0 in | Wt 148.6 lb

## 2014-03-04 DIAGNOSIS — E1165 Type 2 diabetes mellitus with hyperglycemia: Principal | ICD-10-CM

## 2014-03-04 DIAGNOSIS — I4891 Unspecified atrial fibrillation: Secondary | ICD-10-CM

## 2014-03-04 DIAGNOSIS — I6350 Cerebral infarction due to unspecified occlusion or stenosis of unspecified cerebral artery: Secondary | ICD-10-CM

## 2014-03-04 DIAGNOSIS — N183 Chronic kidney disease, stage 3 unspecified: Secondary | ICD-10-CM

## 2014-03-04 DIAGNOSIS — N2581 Secondary hyperparathyroidism of renal origin: Secondary | ICD-10-CM

## 2014-03-04 DIAGNOSIS — Z5181 Encounter for therapeutic drug level monitoring: Secondary | ICD-10-CM | POA: Insufficient documentation

## 2014-03-04 DIAGNOSIS — E039 Hypothyroidism, unspecified: Secondary | ICD-10-CM

## 2014-03-04 DIAGNOSIS — E1129 Type 2 diabetes mellitus with other diabetic kidney complication: Secondary | ICD-10-CM

## 2014-03-04 DIAGNOSIS — I639 Cerebral infarction, unspecified: Secondary | ICD-10-CM

## 2014-03-04 LAB — TSH: TSH: 1.33 u[IU]/mL (ref 0.35–5.50)

## 2014-03-04 LAB — CBC WITH DIFFERENTIAL/PLATELET
BASOS PCT: 0 % (ref 0.0–3.0)
Basophils Absolute: 0 10*3/uL (ref 0.0–0.1)
EOS PCT: 2.5 % (ref 0.0–5.0)
Eosinophils Absolute: 0.2 10*3/uL (ref 0.0–0.7)
HCT: 38.2 % (ref 36.0–46.0)
HEMOGLOBIN: 12.4 g/dL (ref 12.0–15.0)
LYMPHS PCT: 8.4 % — AB (ref 12.0–46.0)
Lymphs Abs: 0.7 10*3/uL (ref 0.7–4.0)
MCHC: 32.4 g/dL (ref 30.0–36.0)
MCV: 99.5 fl (ref 78.0–100.0)
Monocytes Absolute: 0.7 10*3/uL (ref 0.1–1.0)
Monocytes Relative: 8.9 % (ref 3.0–12.0)
NEUTROS ABS: 6.6 10*3/uL (ref 1.4–7.7)
Neutrophils Relative %: 80.2 % — ABNORMAL HIGH (ref 43.0–77.0)
Platelets: 190 10*3/uL (ref 150.0–400.0)
RBC: 3.84 Mil/uL — AB (ref 3.87–5.11)
RDW: 15.7 % — ABNORMAL HIGH (ref 11.5–14.6)
WBC: 8.2 10*3/uL (ref 4.5–10.5)

## 2014-03-04 LAB — BASIC METABOLIC PANEL
BUN: 21 mg/dL (ref 6–23)
CO2: 24 mEq/L (ref 19–32)
Calcium: 9 mg/dL (ref 8.4–10.5)
Chloride: 106 mEq/L (ref 96–112)
Creatinine, Ser: 1.4 mg/dL — ABNORMAL HIGH (ref 0.4–1.2)
GFR: 37.63 mL/min — AB (ref >60.00)
GLUCOSE: 94 mg/dL (ref 70–99)
POTASSIUM: 4.1 meq/L (ref 3.5–5.1)
SODIUM: 139 meq/L (ref 135–145)

## 2014-03-04 LAB — HEMOGLOBIN A1C: Hgb A1c MFr Bld: 7.9 % — ABNORMAL HIGH (ref 4.6–6.5)

## 2014-03-04 LAB — POCT INR: INR: 2.2

## 2014-03-04 NOTE — Progress Notes (Signed)
Patient ID: [name], female   DOB: March 26, 1926, 80 y.o.   MRN: [id number]   Reason for Appointment: Diabetes follow-up   History of Present Illness   Diagnosis: Type 2 DIABETES MELITUS, long-standing  Previous history: She has been on insulin for several years and usually is compliant with her basal bolus regimen. However she usually has significant fluctuation in her blood sugars and difficult to have consistent trend. Overall blood sugars are usually high after meals  Insulin regimen: Levemir 24 in the morning and 18 in the evening, Humalog 16-- 22--28 [facility]    RECENT history:   The patient's blood sugars are again quite erratic and overall slightly higher. Despite her trying to be compliant with her insulin doses as directed as well as taking her Humalog right before eating she has significantly high readings throughout the day. However sporadically she will have low normal blood sugars especially in the mornings and late evenings Most of her sugars appear to be higher midday and afternoon with the median blood sugar is over 200 throughout the day except after supper Has been taking she is watching her diet although sometimes made cereal at breakfast She has not been tried on Lantus insulin but currently has a large supply of Levemir A1c has been consistently over 8%   Proper timing of medications in relation to meals: Yes.          Monitors blood glucose:  3 times a day.    Glucometer: One Touch.          Blood Glucose readings from meter download:   PREMEAL Breakfast Lunch Dinner  late p.m.  Overall  Glucose range:  53-154   98-237   47-137   47-322    Mean/median:  88      104    Hypoglycemia:  as 17% readings below 70, lowest reading 47 on 02/19/14 Meals: 3 meals per day.  for breakfast he will have an egg and toast but sometimes cereal. Suppertime about 5:30 p.m.    Physical activity:  minimal           Complications: are: Nephropathy    Eye exams: Have been regular   Wt  Readings from Last 3 Encounters:  03/04/14 148 lb 9.6 oz (67.405 kg)  02/02/14 145 lb 9.6 oz (66.044 kg)  01/19/14 146 lb 11.2 oz (66.543 kg)   DIABETES labs  Lab Results  Component Value Date   HGBA1C 8.9* 11/24/2013   HGBA1C 9.5* 07/29/2013   HGBA1C 6.7* 08/10/2012   Lab Results  Component Value Date   MICROALBUR 7.2* 11/24/2013   LDLCALC 54 11/24/2013   CREATININE 1.6* 11/24/2013      Medication List       This list is accurate as of: 03/04/14 11:05 AM.  Always use your most recent med list.               atorvastatin 20 MG tablet  Commonly known as:  LIPITOR  Take 1 tablet (20 mg total) by mouth at bedtime.     diltiazem 360 MG 24 hr capsule  Commonly known as:  CARDIZEM CD  Take 180 mg by mouth daily.     furosemide 40 MG tablet  Commonly known as:  LASIX  Take 20 mg by mouth daily.     glucose blood test strip  Commonly known as:  ONE TOUCH TEST STRIPS  Use as instructed to check blood sugars 4 times per day dx code 250.00  HUMALOG KWIKPEN 100 UNIT/ML KiwkPen  Generic drug:  insulin lispro  INJECT 20 TO 28 UNITS UNDER THE SKIN THREE TIMES A DAY WITH MEALS (20UNITS AT BREAKFAST, 25 UNITS AT LUNCH, AND 30 UNITS AT SUPPER)     insulin detemir 100 UNIT/ML injection  Commonly known as:  LEVEMIR  Inject 30 Units into the skin at bedtime. 30 in am and 20 in pm     levothyroxine 100 MCG tablet  Commonly known as:  SYNTHROID, LEVOTHROID  TAKE 1 TABLET EVERY DAY BUT SUNDAY     metoprolol succinate 25 MG 24 hr tablet  Commonly known as:  TOPROL-XL  TAKE 2 TABLETS DAILY     potassium chloride 10 MEQ tablet  Commonly known as:  KLOR-CON M10  Take 1 tablet (10 mEq total) by mouth daily.     silver sulfADIAZINE 1 % cream  Commonly known as:  SILVADENE  Apply 1 application topically daily.     warfarin 5 MG tablet  Commonly known as:  COUMADIN  Take as directed by Anticoagulation clinic        Allergies:  Allergies  Allergen Reactions  . Tenex  [Guanfacine Hcl] Other (See Comments)    hypotension  . Crestor [Rosuvastatin] Other (See Comments)    Myalgia    Past Medical History  Diagnosis Date  . Diabetes mellitus   . Hypertension   . Hyperlipidemia   . Obesity   . Hypothyroidism   . Vulvar cancer, carcinoma 11/03/2011  . Stroke 05/2000    right brain CVA pre H&P 2001  . Atrial fibrillation   . Cancer 2007    vulva-invasive well diff squamous cell ca  . CVA (cerebral infarction)   . Dyslipidemia   . History of radiation therapy 10/20/2013-12/01/2013    60 gray to perineum area and lower pelvis    Past Surgical History  Procedure Laterality Date  . Vulvar lesion removal  2012  . Breast surgery      Lumpectomy  . Dilation and curettage of uterus    . Cystourethroplasty / ureteroneocystostomy    . Radical vulvectomy    . Lymphadenectomy  2007    R inguinal femoral  . Radical vulvectomy  02/27/12  . Vulvar lesion removal  02/27/2012    Procedure: VULVAR LESION;  Surgeon: Alvino Chapel, MD;  Location: [facility];  Service: Gynecology;  Laterality: N/A;  . Right carotid endarterctomy    . Left eye surgery      , permanently stitched eye together    Family History  Problem Relation Age of Onset  . Diabetes type II    . Coronary artery disease Mother   . Lung cancer Brother   . Diabetes type II Brother     Social History:  reports that she quit smoking about 46 years ago. Her smoking use included Cigarettes. She has a 4.5 pack-year smoking history. She does not have any smokeless tobacco history on file. She reports that she does not drink alcohol or use illicit drugs.  Review of Systems:  HYPERTENSION:  her blood pressure  as well  controlled with diltiazem and metoprolol, also being used for heart  rate control. Blood pressure tends to be low normal   She is on chronic Lasix, possible history of diastolic heart failure, this has helped her dyspnea on exertion  She has a history of atrial fibrillation,  taking Coumadin. To see cardiologist today  HYPERLIPIDEMIA: well-controlled  She is still having problems with her left eye  and scarring of her eyelid, also decreased vision in the left   She has a long-standing history of hypothyroidism and is compliant with her Synthroid 100 mcg  Lab Results  Component Value Date   TSH 2.67 09/24/2013      Chronic kidney disease/nephropathy: Has had mild increase in creatinine  which is stable, has history of proteinuria but microalbumin has been   Normal recently   Last foot exam: 11/2013 No significant neuropathy on exam but she has decreased pulses    Examination:   BP 108/60  Pulse 98  Temp(Src) 98.3 F (36.8 C)  Resp 16  Ht 5\' 4"  (1.626 m)  Wt 148 lb 9.6 oz (67.405 kg)  BMI 25.49 kg/m2  SpO2 94%  Body mass index is 25.49 kg/(m^2).   No ankle edema  ASSESSMENT/ PLAN:   Diabetes type 2   The patient's diabetes is much better controlled and blood sugars are unexpected low compared to previously Not clear why she is more insulin sensitive and her dose was not increased significantly on the last visit Recently has had less hypoglycemia but generally has low normal or normal readings waking up Also has sporadic high postprandial readings, some of them related to eating cereal in the morning   Will decrease her LEVEMIR in the evening by 4 units to 26 units  She will take extra 4 units when eating cereal in the morning  To call if blood sugars are getting lower  HYPOTHYROIDISM: Will need followup TSH on her next visit  Renal dysfunction: Will rule out secondary hyperparathyroidism with PTH levels  Total visit time including counseling and review of labs 25 minutes  [name] 03/04/2014, 11:05 AM   Addendum: A1c improved at 7.9, creatinine stable at 1.4, calcium normal, hemoglobin normal PTH 136, will start calcitriol 0.25 mcg 3 times a week  Anti-coag visit on 03/04/2014  Component Date Value Ref Range Status  . INR  03/04/2014 2.2   Final  Appointment on 03/04/2014  Component Date Value Ref Range Status  . Hemoglobin A1C 03/04/2014 7.9* 4.6 - 6.5 % Final   Glycemic Control Guidelines for People with Diabetes:Non Diabetic:  <6%Goal of Therapy: <7%Additional Action Suggested:  >8%   . Sodium 03/04/2014 139  135 - 145 mEq/L Final  . Potassium 03/04/2014 4.1  3.5 - 5.1 mEq/L Final  . Chloride 03/04/2014 106  96 - 112 mEq/L Final  . CO2 03/04/2014 24  19 - 32 mEq/L Final  . Glucose, Bld 03/04/2014 94  70 - 99 mg/dL Final  . BUN 03/04/2014 21  6 - 23 mg/dL Final  . Creatinine, Ser 03/04/2014 1.4* 0.4 - 1.2 mg/dL Final  . Calcium 03/04/2014 9.0  8.4 - 10.5 mg/dL Final  . GFR 03/04/2014 37.63* >60.00 mL/min Final  . TSH 03/04/2014 1.33  0.35 - 5.50 uIU/mL Final  . WBC 03/04/2014 8.2  4.5 - 10.5 K/uL Final  . RBC 03/04/2014 3.84* 3.87 - 5.11 Mil/uL Final  . Hemoglobin 03/04/2014 12.4  12.0 - 15.0 g/dL Final  . HCT 03/04/2014 38.2  36.0 - 46.0 % Final  . MCV 03/04/2014 99.5  78.0 - 100.0 fl Final  . MCHC 03/04/2014 32.4  30.0 - 36.0 g/dL Final  . RDW 03/04/2014 15.7* 11.5 - 14.6 % Final  . Platelets 03/04/2014 190.0  150.0 - 400.0 K/uL Final  . Neutrophils Relative % 03/04/2014 80.2* 43.0 - 77.0 % Final  . Lymphocytes Relative 03/04/2014 8.4* 12.0 - 46.0 % Final  . Monocytes Relative  03/04/2014 8.9  3.0 - 12.0 % Final  . Eosinophils Relative 03/04/2014 2.5  0.0 - 5.0 % Final  . Basophils Relative 03/04/2014 0.0  0.0 - 3.0 % Final  . Neutro Abs 03/04/2014 6.6  1.4 - 7.7 K/uL Final  . Lymphs Abs 03/04/2014 0.7  0.7 - 4.0 K/uL Final  . Monocytes Absolute 03/04/2014 0.7  0.1 - 1.0 K/uL Final  . Eosinophils Absolute 03/04/2014 0.2  0.0 - 0.7 K/uL Final  . Basophils Absolute 03/04/2014 0.0  0.0 - 0.1 K/uL Final  . PTH 03/04/2014 136.2* 14.0 - 72.0 pg/mL Final

## 2014-03-04 NOTE — Patient Instructions (Addendum)
Levemir  26 in the evening,   Humalog same but if eating cereal take 24 Humalog  Miralax daily for constipation

## 2014-03-05 LAB — PARATHYROID HORMONE, INTACT (NO CA): PTH: 136.2 pg/mL — ABNORMAL HIGH (ref 14.0–72.0)

## 2014-03-06 ENCOUNTER — Other Ambulatory Visit: Payer: Self-pay | Admitting: *Deleted

## 2014-03-06 MED ORDER — CALCITRIOL 0.25 MCG PO CAPS: ORAL_CAPSULE | ORAL | Status: DC

## 2014-03-07 ENCOUNTER — Other Ambulatory Visit: Payer: Self-pay | Admitting: Endocrinology

## 2014-03-07 ENCOUNTER — Telehealth: Payer: Self-pay | Admitting: Family Medicine

## 2014-03-07 NOTE — Telephone Encounter (Signed)
Late entry.  Call from EMS on 03/06/14.  EMS is on the scene with patient. Called due to persistent hypoglycemia, down to 36.  Sugar eventually normalized with repeated snacks.  Per EMS, NAD and VSS.  Pt declined transport for eval.  Dec in appetite noted. Would give half dose of PM insulin on 03/06/14 if she eats dinner, o/w skip PM dose.  Will route to endo as FYI for follow up on Monday.

## 2014-03-09 ENCOUNTER — Encounter: Payer: Self-pay | Admitting: Endocrinology

## 2014-03-09 ENCOUNTER — Ambulatory Visit (INDEPENDENT_AMBULATORY_CARE_PROVIDER_SITE_OTHER): Payer: Medicare Other | Admitting: Endocrinology

## 2014-03-09 VITALS — BP 100/60 | HR 99 | Temp 98.1°F | Resp 14 | Ht 64.0 in | Wt 148.6 lb

## 2014-03-09 DIAGNOSIS — E161 Other hypoglycemia: Secondary | ICD-10-CM

## 2014-03-09 DIAGNOSIS — E160 Drug-induced hypoglycemia without coma: Secondary | ICD-10-CM

## 2014-03-09 DIAGNOSIS — T383X5A Adverse effect of insulin and oral hypoglycemic [antidiabetic] drugs, initial encounter: Principal | ICD-10-CM

## 2014-03-09 NOTE — Progress Notes (Signed)
Patient ID: [name], female   DOB: 1926-08-14, 80 y.o.   MRN: [id number]   Reason for Appointment: Low blood sugar  History of Present Illness   Diagnosis: Type 2 DIABETES MELITUS, long-standing  Previous history: She has been on insulin for several years and usually is compliant with her basal bolus regimen. However she usually has significant fluctuation in her blood sugars and difficult to have consistent trend. Overall blood sugars are usually high after meals  Insulin regimen: Levemir 30 in the morning and 20 in the evening, Humalog 16-- 22--28 [facility]    RECENT history:    She was seen as a work in today for evaluation of her recent low blood sugar episode On Friday afternoon she appeared to be confused when her husband tried to call her and apparently her glucose was down to 36. He started treating her with honey and also called the EMS. Patient was treated by oral glucose and food supplements and she recovered Glucose that night was as high as 295 She has not had any other low sugar episode since her last visit when her evening Levemir was reduced  Since Saturday her blood sugars have been fairly normal except down to 58 this morning She did not take her Humalog at breakfast and glucose at lunch was 212 A1c has been consistently over 8% Proper timing of medications in relation to meals: Yes.          Monitors blood glucose:  3 times a day.    Glucometer: One Touch.          Blood Glucose readings recently from meter download:   PREMEAL Breakfast Lunch Dinner Bedtime Overall  Glucose range:  58-181   76-236   36-134   85-295    Mean/median:        Meals: 3 meals per day.  for breakfast  will have an egg and toast but sometimes cereal. Suppertime about 5:30 p.m.    Physical activity:  minimal           Complications: are: Nephropathy    Eye exams: Have been regular   Wt Readings from Last 3 Encounters:  03/09/14 148 lb 9.6 oz (67.405 kg)  03/04/14 148 lb 9.6 oz (67.405 kg)   02/02/14 145 lb 9.6 oz (66.044 kg)   DIABETES labs  Lab Results  Component Value Date   HGBA1C 7.9* 03/04/2014   HGBA1C 8.9* 11/24/2013   HGBA1C 9.5* 07/29/2013   Lab Results  Component Value Date   MICROALBUR 7.2* 11/24/2013   LDLCALC 54 11/24/2013   CREATININE 1.4* 03/04/2014      Medication List       This list is accurate as of: 03/09/14  2:40 PM.  Always use your most recent med list.               atorvastatin 20 MG tablet  Commonly known as:  LIPITOR  Take 1 tablet (20 mg total) by mouth at bedtime.     calcitRIOL 0.25 MCG capsule  Commonly known as:  ROCALTROL  Take 1 capsule 3 times a week     CARTIA XT 180 MG 24 hr capsule  Generic drug:  diltiazem  TAKE 1 CAPSULE DAILY     diltiazem 360 MG 24 hr capsule  Commonly known as:  CARDIZEM CD  Take 180 mg by mouth daily.     furosemide 40 MG tablet  Commonly known as:  LASIX  Take 20 mg by mouth daily.  glucose blood test strip  Commonly known as:  ONE TOUCH TEST STRIPS  Use as instructed to check blood sugars 4 times per day dx code 250.00     HUMALOG KWIKPEN 100 UNIT/ML KiwkPen  Generic drug:  insulin lispro  INJECT 20 TO 28 UNITS UNDER THE SKIN THREE TIMES A DAY WITH MEALS (20UNITS AT BREAKFAST, 25 UNITS AT LUNCH, AND 30 UNITS AT SUPPER)     insulin detemir 100 UNIT/ML injection  Commonly known as:  LEVEMIR  Inject 30 Units into the skin at bedtime. 30 in am and 20 in pm     levothyroxine 100 MCG tablet  Commonly known as:  SYNTHROID, LEVOTHROID  TAKE 1 TABLET EVERY DAY BUT SUNDAY     metoprolol succinate 25 MG 24 hr tablet  Commonly known as:  TOPROL-XL  TAKE 2 TABLETS DAILY     potassium chloride 10 MEQ tablet  Commonly known as:  KLOR-CON M10  Take 1 tablet (10 mEq total) by mouth daily.     silver sulfADIAZINE 1 % cream  Commonly known as:  SILVADENE  Apply 1 application topically daily.     warfarin 5 MG tablet  Commonly known as:  COUMADIN  Take as directed by Anticoagulation  clinic        Allergies:  Allergies  Allergen Reactions  . Tenex [Guanfacine Hcl] Other (See Comments)    hypotension  . Crestor [Rosuvastatin] Other (See Comments)    Myalgia    Past Medical History  Diagnosis Date  . Diabetes mellitus   . Hypertension   . Hyperlipidemia   . Obesity   . Hypothyroidism   . Vulvar cancer, carcinoma 11/03/2011  . Stroke 05/2000    right brain CVA pre H&P 2001  . Atrial fibrillation   . Cancer 2007    vulva-invasive well diff squamous cell ca  . CVA (cerebral infarction)   . Dyslipidemia   . History of radiation therapy 10/20/2013-12/01/2013    60 gray to perineum area and lower pelvis    Past Surgical History  Procedure Laterality Date  . Vulvar lesion removal  2012  . Breast surgery      Lumpectomy  . Dilation and curettage of uterus    . Cystourethroplasty / ureteroneocystostomy    . Radical vulvectomy    . Lymphadenectomy  2007    R inguinal femoral  . Radical vulvectomy  02/27/12  . Vulvar lesion removal  02/27/2012    Procedure: VULVAR LESION;  Surgeon: Alvino Chapel, MD;  Location: [facility];  Service: Gynecology;  Laterality: N/A;  . Right carotid endarterctomy    . Left eye surgery      , permanently stitched eye together    Family History  Problem Relation Age of Onset  . Diabetes type II    . Coronary artery disease Mother   . Lung cancer Brother   . Diabetes type II Brother     Social History:  reports that she quit smoking about 46 years ago. Her smoking use included Cigarettes. She has a 4.5 pack-year smoking history. She does not have any smokeless tobacco history on file. She reports that she does not drink alcohol or use illicit drugs.  Review of Systems:  HYPERTENSION:  her blood pressure  as well  controlled with diltiazem and metoprolol, also being used for heart  rate control. Blood pressure tends to be low normal without lightheadedness.  She has a history of atrial fibrillation, taking Coumadin.  Does not have  a followup with cardiologist scheduled   HYPERLIPIDEMIA: well-controlled  She has a long-standing history of hypothyroidism   Lab Results  Component Value Date   TSH 1.33 03/04/2014      Chronic kidney disease/nephropathy: Has had mild increase in creatinine  which is stable , has history of proteinuria but microalbumin  more recently  Normal Recent PTH 136, started on calcitriol 3 times a week   Last foot exam: 11/2013 No significant neuropathy on exam but she has decreased pulses    Examination:   BP 100/60  Pulse 99  Temp(Src) 98.1 F (36.7 C)  Resp 14  Ht 5\' 4"  (1.626 m)  Wt 148 lb 9.6 oz (67.405 kg)  BMI 25.49 kg/m2  SpO2 91%  Body mass index is 25.49 kg/(m^2).    ASSESSMENT/ PLAN::   Diabetes type 2   She appears to have had a severe hypoglycemic episode around supper time which was unexpected and not explained by change in her food intake or other factors Since then her blood sugars have been fairly close to normal but this morning her glucose was relatively low again without symptoms She will reduce her morning Levemir might 6 units as well as mealtime and evening Levemir She can continue to take her evening Levemir at suppertime  Discussed recent prescription for calcitriol for secondary hyperparathyroidism   Keny Donald 03/09/2014, 2:40 PM

## 2014-03-09 NOTE — Patient Instructions (Addendum)
Levemir 24 in the morning and 18 in the evening at dinner   Humalog 12-- 18--26 [facility]    Call sugar reading on Wed and Friday

## 2014-03-13 ENCOUNTER — Other Ambulatory Visit: Payer: Self-pay | Admitting: *Deleted

## 2014-03-13 ENCOUNTER — Telehealth: Payer: Self-pay | Admitting: Endocrinology

## 2014-03-13 NOTE — Telephone Encounter (Signed)
[facility] has been removed from her list

## 2014-03-13 NOTE — Telephone Encounter (Signed)
Do not send anymore rx thru [facility]

## 2014-03-16 ENCOUNTER — Ambulatory Visit: Payer: Medicare Other | Attending: Gynecology | Admitting: Gynecology

## 2014-03-16 ENCOUNTER — Encounter: Payer: Self-pay | Admitting: Gynecology

## 2014-03-16 VITALS — BP 127/78 | HR 108 | Temp 98.0°F | Resp 16

## 2014-03-16 DIAGNOSIS — Z8673 Personal history of transient ischemic attack (TIA), and cerebral infarction without residual deficits: Secondary | ICD-10-CM | POA: Insufficient documentation

## 2014-03-16 DIAGNOSIS — E119 Type 2 diabetes mellitus without complications: Secondary | ICD-10-CM | POA: Insufficient documentation

## 2014-03-16 DIAGNOSIS — E669 Obesity, unspecified: Secondary | ICD-10-CM | POA: Insufficient documentation

## 2014-03-16 DIAGNOSIS — Z888 Allergy status to other drugs, medicaments and biological substances status: Secondary | ICD-10-CM | POA: Insufficient documentation

## 2014-03-16 DIAGNOSIS — Z833 Family history of diabetes mellitus: Secondary | ICD-10-CM | POA: Insufficient documentation

## 2014-03-16 DIAGNOSIS — E785 Hyperlipidemia, unspecified: Secondary | ICD-10-CM | POA: Insufficient documentation

## 2014-03-16 DIAGNOSIS — Z794 Long term (current) use of insulin: Secondary | ICD-10-CM | POA: Insufficient documentation

## 2014-03-16 DIAGNOSIS — E039 Hypothyroidism, unspecified: Secondary | ICD-10-CM | POA: Insufficient documentation

## 2014-03-16 DIAGNOSIS — C519 Malignant neoplasm of vulva, unspecified: Secondary | ICD-10-CM

## 2014-03-16 DIAGNOSIS — K5900 Constipation, unspecified: Secondary | ICD-10-CM

## 2014-03-16 DIAGNOSIS — Z7901 Long term (current) use of anticoagulants: Secondary | ICD-10-CM | POA: Insufficient documentation

## 2014-03-16 DIAGNOSIS — Z79899 Other long term (current) drug therapy: Secondary | ICD-10-CM | POA: Insufficient documentation

## 2014-03-16 DIAGNOSIS — G8929 Other chronic pain: Secondary | ICD-10-CM | POA: Insufficient documentation

## 2014-03-16 DIAGNOSIS — K626 Ulcer of anus and rectum: Secondary | ICD-10-CM | POA: Insufficient documentation

## 2014-03-16 DIAGNOSIS — Z9079 Acquired absence of other genital organ(s): Secondary | ICD-10-CM | POA: Insufficient documentation

## 2014-03-16 DIAGNOSIS — I10 Essential (primary) hypertension: Secondary | ICD-10-CM | POA: Insufficient documentation

## 2014-03-16 DIAGNOSIS — I4891 Unspecified atrial fibrillation: Secondary | ICD-10-CM | POA: Insufficient documentation

## 2014-03-16 DIAGNOSIS — Z87891 Personal history of nicotine dependence: Secondary | ICD-10-CM | POA: Insufficient documentation

## 2014-03-16 NOTE — Progress Notes (Signed)
Consult Note: Gyn-Onc   [name] 80 y.o. female  Chief Complaint  Patient presents with  . Vulvar Cancer   assessment t: Recurrent vulvar cancer. There is a healing ulcer in the perianal area with chronic pain.. Constipation  Plan: Patient return to see me in 2-3 months  for further evaluation.[name] continue her current regimen of vulvar hygiene.  She is given a prescription for lactulose to be used once or twice a day for constipation. In addition she will try a due to lack suppository.  She is given a prescription for lidocaine ointment to be applied twice or 3 times a day to the painful perineal ulcer.  Interval History: Patient returns today as previously scheduled. Her chief complaint is that of constipation. She has been using a stool softener as well as MiraLAX for 3 days with no relief. She does have some perianal pain but she does not think this is causing her to avoid having bowel movements. She has no urinary tract symptoms. She has been using oxycodone sparingly for pain control.  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 At that time she underwent a modified radical vulvectomy and right inguinal lymphadenectomy. Lymph nodes and surgical margins were negative. She subsequently developed a recurrence on the left vulva in October 2008 underwent left modified radical vulvectomy. Given her age and overall poor performance status we did not perform an inguinal lymphadenectomy.  The patient subsequently had a third recurrence of October 2009 and this was excised and the rhomboid flap was used to fill the surgical defect. Surgical margins were again negative. Her most recent recurrences in September 2012 which is again excised.  She had a superficial wound separation which closed by secondary intent.   She had yet another recurrence in November 2014 treated with external beam radiation therapy because of the lesion location very close to the  anus.     Review of System Ten point review of systems is negative except as noted above      Allergies  Allergen Reactions  . Tenex [Guanfacine Hcl] Other (See Comments)    hypotension  . Crestor [Rosuvastatin] Other (See Comments)    Myalgia    Past Medical History  Diagnosis Date  . Diabetes mellitus   . Hypertension   . Hyperlipidemia   . Obesity   . Hypothyroidism   . Vulvar cancer, carcinoma 11/03/2011  . Stroke 05/2000    right brain CVA pre H&P 2001  . Atrial fibrillation   . Cancer 2007    vulva-invasive well diff squamous cell ca  . CVA (cerebral infarction)   . Dyslipidemia   . History of radiation therapy 10/20/2013-12/01/2013    60 gray to perineum area and lower pelvis    Past Surgical History  Procedure Laterality Date  . Vulvar lesion removal  2012  . Breast surgery      Lumpectomy  . Dilation and curettage of uterus    . Cystourethroplasty / ureteroneocystostomy    . Radical vulvectomy    . Lymphadenectomy  2007    R inguinal femoral  . Radical vulvectomy  02/27/12  . Vulvar lesion removal  02/27/2012    Procedure: VULVAR LESION;  Surgeon: Alvino Chapel, MD;  Location: [facility];  Service: Gynecology;  Laterality: N/A;  . Right carotid endarterctomy    . Left eye surgery      , permanently stitched eye together    Current Outpatient Prescriptions  Medication Sig  Dispense Refill  . atorvastatin (LIPITOR) 20 MG tablet Take 1 tablet (20 mg total) by mouth at bedtime.  30 tablet  6  . calcitRIOL (ROCALTROL) 0.25 MCG capsule Take 1 capsule 3 times a week  12 capsule  3  . CARTIA XT 180 MG 24 hr capsule TAKE 1 CAPSULE DAILY  90 capsule  5  . diltiazem (CARDIZEM CD) 360 MG 24 hr capsule Take 180 mg by mouth daily.       . furosemide (LASIX) 40 MG tablet Take 20 mg by mouth daily.       [name] glucose blood (ONE TOUCH TEST STRIPS) test strip Use as instructed to check blood sugars 4 times per day dx code 250.00  150 each  12  . insulin detemir  (LEVEMIR) 100 UNIT/ML injection Inject 20 Units into the skin 2 (two) times daily. 30 in am and 20 in pm      . insulin lispro (HUMALOG KWIKPEN) 100 UNIT/ML KiwkPen INJECT 20 TO 28 UNITS UNDER THE SKIN THREE TIMES A DAY WITH MEALS (20UNITS AT BREAKFAST, 25 UNITS AT LUNCH, AND 30 UNITS AT SUPPER)      . levothyroxine (SYNTHROID, LEVOTHROID) 100 MCG tablet TAKE 1 TABLET EVERY DAY BUT SUNDAY  90 tablet  2  . metoprolol succinate (TOPROL-XL) 25 MG 24 hr tablet TAKE 2 TABLETS DAILY  180 tablet  2  . potassium chloride (KLOR-CON M10) 10 MEQ tablet Take 1 tablet (10 mEq total) by mouth daily.  30 tablet  5  . silver sulfADIAZINE (SILVADENE) 1 % cream Apply 1 application topically daily.      [name] warfarin (COUMADIN) 5 MG tablet Take as directed by Anticoagulation clinic  30 tablet  3   No current facility-administered medications for this visit.    History   Social History  . Marital Status: Married    Spouse Name: N/A    Number of Children: N/A  . Years of Education: N/A   Occupational History  . Not on file.   Social History Main Topics  . Smoking status: Former Smoker -- 0.25 packs/day for 18 years    Types: Cigarettes    Quit date: 11/03/1967  . Smokeless tobacco: Not on file  . Alcohol Use: No  . Drug Use: No  . Sexual Activity: Not Currently   Other Topics Concern  . Not on file   Social History Narrative  . No narrative on file    Family History  Problem Relation Age of Onset  . Diabetes type II    . Coronary artery disease Mother   . Lung cancer Brother   . Diabetes type II Brother        Vitals: Blood pressure 127/78, pulse 108, temperature 98 F (36.7 C), temperature source Oral, resp. rate 16.  Physical Exam: In general the patient is a pleasant elderly white female no acute distress.  HEENT is negative except that her left eyelid has been partially closed surgically.. Neck is supple without thyromegaly.  There is no supraclavicular or inguinal  adenopathy.  The abdomen is soft nontender no masses organomegaly or ascites is noted.  Pelvic exam EGBUS is status post radical vulvectomy with advancement flaps. There is an ulcerated area on the left perianal region where the recurrence recently was treated with radiation therapy. Certainly the exophytic portion of the tumor has resolved.  At the present time, I presume this is still recovering from radiation therapy. No new lesions are noted. On palpation area is painful.  Alvino Chapel, MD 03/16/2014, 2:00 PM                         Consult Note: Gyn-Onc   [name] 80 y.o. female  Chief Complaint  Patient presents with  . Vulvar Cancer    Interval History:   HPI:  Allergies  Allergen Reactions  . Tenex [Guanfacine Hcl] Other (See Comments)    hypotension  . Crestor [Rosuvastatin] Other (See Comments)    Myalgia    Past Medical History  Diagnosis Date  . Diabetes mellitus   . Hypertension   . Hyperlipidemia   . Obesity   . Hypothyroidism   . Vulvar cancer, carcinoma 11/03/2011  . Stroke 05/2000    right brain CVA pre H&P 2001  . Atrial fibrillation   . Cancer 2007    vulva-invasive well diff squamous cell ca  . CVA (cerebral infarction)   . Dyslipidemia   . History of radiation therapy 10/20/2013-12/01/2013    60 gray to perineum area and lower pelvis    Past Surgical History  Procedure Laterality Date  . Vulvar lesion removal  2012  . Breast surgery      Lumpectomy  . Dilation and curettage of uterus    . Cystourethroplasty / ureteroneocystostomy    . Radical vulvectomy    . Lymphadenectomy  2007    R inguinal femoral  . Radical vulvectomy  02/27/12  . Vulvar lesion removal  02/27/2012    Procedure: VULVAR LESION;  Surgeon: Alvino Chapel, MD;  Location: [facility];  Service: Gynecology;  Laterality: N/A;  . Right carotid endarterctomy    . Left eye surgery      , permanently stitched eye together     Current Outpatient Prescriptions  Medication Sig Dispense Refill  . atorvastatin (LIPITOR) 20 MG tablet Take 1 tablet (20 mg total) by mouth at bedtime.  30 tablet  6  . calcitRIOL (ROCALTROL) 0.25 MCG capsule Take 1 capsule 3 times a week  12 capsule  3  . CARTIA XT 180 MG 24 hr capsule TAKE 1 CAPSULE DAILY  90 capsule  5  . diltiazem (CARDIZEM CD) 360 MG 24 hr capsule Take 180 mg by mouth daily.       . furosemide (LASIX) 40 MG tablet Take 20 mg by mouth daily.       [name] glucose blood (ONE TOUCH TEST STRIPS) test strip Use as instructed to check blood sugars 4 times per day dx code 250.00  150 each  12  . insulin detemir (LEVEMIR) 100 UNIT/ML injection Inject 20 Units into the skin 2 (two) times daily. 30 in am and 20 in pm      . insulin lispro (HUMALOG KWIKPEN) 100 UNIT/ML KiwkPen INJECT 20 TO 28 UNITS UNDER THE SKIN THREE TIMES A DAY WITH MEALS (20UNITS AT BREAKFAST, 25 UNITS AT LUNCH, AND 30 UNITS AT SUPPER)      . levothyroxine (SYNTHROID, LEVOTHROID) 100 MCG tablet TAKE 1 TABLET EVERY DAY BUT SUNDAY  90 tablet  2  . metoprolol succinate (TOPROL-XL) 25 MG 24 hr tablet TAKE 2 TABLETS DAILY  180 tablet  2  . potassium chloride (KLOR-CON M10) 10 MEQ tablet Take 1 tablet (10 mEq total) by mouth daily.  30 tablet  5  . silver sulfADIAZINE (SILVADENE) 1 % cream Apply 1 application topically daily.      [name] warfarin (COUMADIN) 5 MG tablet Take as directed by Anticoagulation clinic  30 tablet  3   No current facility-administered medications for this visit.    History   Social History  . Marital Status: Married    Spouse Name: N/A    Number of Children: N/A  . Years of Education: N/A   Occupational History  . Not on file.   Social History Main Topics  . Smoking status: Former Smoker -- 0.25 packs/day for 18 years    Types: Cigarettes    Quit date: 11/03/1967  . Smokeless tobacco: Not on file  . Alcohol Use: No  . Drug Use: No  . Sexual Activity: Not Currently   Other Topics  Concern  . Not on file   Social History Narrative  . No narrative on file    Family History  Problem Relation Age of Onset  . Diabetes type II    . Coronary artery disease Mother   . Lung cancer Brother   . Diabetes type II Brother     Review of Systems:  Vitals: Blood pressure 127/78, pulse 108, temperature 98 F (36.7 C), temperature source Oral, resp. rate 16.  Physical Exam:  Assessment/Plan:   Alvino Chapel, MD 03/16/2014, 2:00 PM                         Consult Note: Gyn-Onc   [name] 80 y.o. female  Chief Complaint  Patient presents with  . Vulvar Cancer    Interval History: The patient returns today  last being seen in April 2013. Subsequent to that visit, she's had a number of medical problems including a stroke, atrial fibrillation, and some ophthalmologic problems. On direct questioning the patient does admit and her husband concurs that there is a new lesion on her vulva. She says it "stings". She has not had any bleeding.  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 At that time she underwent a modified radical vulvectomy and right inguinal lymphadenectomy. Lymph nodes and surgical margins were negative. She subsequently developed a recurrence on the left vulva in October 2008 underwent left modified radical vulvectomy. Given her age and overall poor performance status we did not perform an inguinal lymphadenectomy.  The patient subsequently had a third recurrence of October 2009 and this was excised and the rhomboid flap was used to fill the surgical defect. Surgical margins were again negative. Her most recent recurrences in September 2012 which is again excised.  She had a superficial wound separation which closed by secondary intent.      Review of System Ten point review of systems is negative except as noted above      Allergies  Allergen Reactions  . Tenex [Guanfacine Hcl]  Other (See Comments)    hypotension  . Crestor [Rosuvastatin] Other (See Comments)    Myalgia    Past Medical History  Diagnosis Date  . Diabetes mellitus   . Hypertension   . Hyperlipidemia   . Obesity   . Hypothyroidism   . Vulvar cancer, carcinoma 11/03/2011  . Stroke 05/2000    right brain CVA pre H&P 2001  . Atrial fibrillation   . Cancer 2007    vulva-invasive well diff squamous cell ca  . CVA (cerebral infarction)   . Dyslipidemia   . History of radiation therapy 10/20/2013-12/01/2013    60 gray to perineum area and lower pelvis    Past Surgical History  Procedure Laterality Date  . Vulvar lesion removal  2012  .  Breast surgery      Lumpectomy  . Dilation and curettage of uterus    . Cystourethroplasty / ureteroneocystostomy    . Radical vulvectomy    . Lymphadenectomy  2007    R inguinal femoral  . Radical vulvectomy  02/27/12  . Vulvar lesion removal  02/27/2012    Procedure: VULVAR LESION;  Surgeon: Jeannette Corpus, MD;  Location: [facility];  Service: Gynecology;  Laterality: N/A;  . Right carotid endarterctomy    . Left eye surgery      , permanently stitched eye together    Current Outpatient Prescriptions  Medication Sig Dispense Refill  . atorvastatin (LIPITOR) 20 MG tablet Take 1 tablet (20 mg total) by mouth at bedtime.  30 tablet  6  . calcitRIOL (ROCALTROL) 0.25 MCG capsule Take 1 capsule 3 times a week  12 capsule  3  . CARTIA XT 180 MG 24 hr capsule TAKE 1 CAPSULE DAILY  90 capsule  5  . diltiazem (CARDIZEM CD) 360 MG 24 hr capsule Take 180 mg by mouth daily.       . furosemide (LASIX) 40 MG tablet Take 20 mg by mouth daily.       [name] glucose blood (ONE TOUCH TEST STRIPS) test strip Use as instructed to check blood sugars 4 times per day dx code 250.00  150 each  12  . insulin detemir (LEVEMIR) 100 UNIT/ML injection Inject 20 Units into the skin 2 (two) times daily. 30 in am and 20 in pm      . insulin lispro (HUMALOG KWIKPEN) 100 UNIT/ML KiwkPen  INJECT 20 TO 28 UNITS UNDER THE SKIN THREE TIMES A DAY WITH MEALS (20UNITS AT BREAKFAST, 25 UNITS AT LUNCH, AND 30 UNITS AT SUPPER)      . levothyroxine (SYNTHROID, LEVOTHROID) 100 MCG tablet TAKE 1 TABLET EVERY DAY BUT SUNDAY  90 tablet  2  . metoprolol succinate (TOPROL-XL) 25 MG 24 hr tablet TAKE 2 TABLETS DAILY  180 tablet  2  . potassium chloride (KLOR-CON M10) 10 MEQ tablet Take 1 tablet (10 mEq total) by mouth daily.  30 tablet  5  . silver sulfADIAZINE (SILVADENE) 1 % cream Apply 1 application topically daily.      [name] warfarin (COUMADIN) 5 MG tablet Take as directed by Anticoagulation clinic  30 tablet  3   No current facility-administered medications for this visit.    History   Social History  . Marital Status: Married    Spouse Name: N/A    Number of Children: N/A  . Years of Education: N/A   Occupational History  . Not on file.   Social History Main Topics  . Smoking status: Former Smoker -- 0.25 packs/day for 18 years    Types: Cigarettes    Quit date: 11/03/1967  . Smokeless tobacco: Not on file  . Alcohol Use: No  . Drug Use: No  . Sexual Activity: Not Currently   Other Topics Concern  . Not on file   Social History Narrative  . No narrative on file    Family History  Problem Relation Age of Onset  . Diabetes type II    . Coronary artery disease Mother   . Lung cancer Brother   . Diabetes type II Brother        Vitals: Blood pressure 127/78, pulse 108, temperature 98 F (36.7 C), temperature source Oral, resp. rate 16.  Physical Exam: In general the patient is a pleasant elderly white female no  acute distress.  HEENT is negative except that her left eyelid has been partially closed surgically.. Neck is supple without thyromegaly.  There is no supraclavicular or inguinal adenopathy.  The abdomen is soft nontender no masses organomegaly or ascites is noted.  Pelvic exam EGBUS is status post radical vulvectomy with advancement flaps. She now has a  new 4 cm fungating lesion just to the left of the anus. Assessment/Plan:recurrent vulvar cancer adjacent to the anus.   Given the location of this new lesion, I do not believe that surgery would be appropriate as the patient would require an extensive operation including resection of the anal sphincter colostomy. Therefore would like to refer her to radiation oncology for consideration of external beam radiation therapy to this lesion. The patient and her husband are in agreement with this plan.   Jeannette Corpus, MD 03/16/2014, 2:00 PM                         Consult Note: Gyn-Onc   [name] 80 y.o. female  Chief Complaint  Patient presents with  . Vulvar Cancer    Interval History:   HPI:  Allergies  Allergen Reactions  . Tenex [Guanfacine Hcl] Other (See Comments)    hypotension  . Crestor [Rosuvastatin] Other (See Comments)    Myalgia    Past Medical History  Diagnosis Date  . Diabetes mellitus   . Hypertension   . Hyperlipidemia   . Obesity   . Hypothyroidism   . Vulvar cancer, carcinoma 11/03/2011  . Stroke 05/2000    right brain CVA pre H&P 2001  . Atrial fibrillation   . Cancer 2007    vulva-invasive well diff squamous cell ca  . CVA (cerebral infarction)   . Dyslipidemia   . History of radiation therapy 10/20/2013-12/01/2013    60 gray to perineum area and lower pelvis    Past Surgical History  Procedure Laterality Date  . Vulvar lesion removal  2012  . Breast surgery      Lumpectomy  . Dilation and curettage of uterus    . Cystourethroplasty / ureteroneocystostomy    . Radical vulvectomy    . Lymphadenectomy  2007    R inguinal femoral  . Radical vulvectomy  02/27/12  . Vulvar lesion removal  02/27/2012    Procedure: VULVAR LESION;  Surgeon: Jeannette Corpus, MD;  Location: [facility];  Service: Gynecology;  Laterality: N/A;  . Right carotid endarterctomy    . Left eye surgery      , permanently stitched eye  together    Current Outpatient Prescriptions  Medication Sig Dispense Refill  . atorvastatin (LIPITOR) 20 MG tablet Take 1 tablet (20 mg total) by mouth at bedtime.  30 tablet  6  . calcitRIOL (ROCALTROL) 0.25 MCG capsule Take 1 capsule 3 times a week  12 capsule  3  . CARTIA XT 180 MG 24 hr capsule TAKE 1 CAPSULE DAILY  90 capsule  5  . diltiazem (CARDIZEM CD) 360 MG 24 hr capsule Take 180 mg by mouth daily.       . furosemide (LASIX) 40 MG tablet Take 20 mg by mouth daily.       [name] glucose blood (ONE TOUCH TEST STRIPS) test strip Use as instructed to check blood sugars 4 times per day dx code 250.00  150 each  12  . insulin detemir (LEVEMIR) 100 UNIT/ML injection Inject 20 Units into the skin 2 (two) times daily.  30 in am and 20 in pm      . insulin lispro (HUMALOG KWIKPEN) 100 UNIT/ML KiwkPen INJECT 20 TO 28 UNITS UNDER THE SKIN THREE TIMES A DAY WITH MEALS (20UNITS AT BREAKFAST, 25 UNITS AT LUNCH, AND 30 UNITS AT SUPPER)      . levothyroxine (SYNTHROID, LEVOTHROID) 100 MCG tablet TAKE 1 TABLET EVERY DAY BUT SUNDAY  90 tablet  2  . metoprolol succinate (TOPROL-XL) 25 MG 24 hr tablet TAKE 2 TABLETS DAILY  180 tablet  2  . potassium chloride (KLOR-CON M10) 10 MEQ tablet Take 1 tablet (10 mEq total) by mouth daily.  30 tablet  5  . silver sulfADIAZINE (SILVADENE) 1 % cream Apply 1 application topically daily.      [name]. warfarin (COUMADIN) 5 MG tablet Take as directed by Anticoagulation clinic  30 tablet  3   No current facility-administered medications for this visit.    History   Social History  . Marital Status: Married    Spouse Name: N/A    Number of Children: N/A  . Years of Education: N/A   Occupational History  . Not on file.   Social History Main Topics  . Smoking status: Former Smoker -- 0.25 packs/day for 18 years    Types: Cigarettes    Quit date: 11/03/1967  . Smokeless tobacco: Not on file  . Alcohol Use: No  . Drug Use: No  . Sexual Activity: Not Currently   Other  Topics Concern  . Not on file   Social History Narrative  . No narrative on file    Family History  Problem Relation Age of Onset  . Diabetes type II    . Coronary artery disease Mother   . Lung cancer Brother   . Diabetes type II Brother     Review of Systems:  Vitals: Blood pressure 127/78, pulse 108, temperature 98 F (36.7 C), temperature source Oral, resp. rate 16.  Physical Exam:  Assessment/Plan:   Jeannette CorpusLARKE-PEARSON,Tamari Redwine L, MD 03/16/2014, 2:00 PM

## 2014-03-16 NOTE — Patient Instructions (Addendum)
Try using a dulcolax suppository for constipation or lactulose.  You can buy the Dulcolax suppositories over the counter and it will be placed in the rectum.  Continue stool softeners while taking pain medication.  You can apply the lidocaine jelly to the painful ulcer on your vulva up to three times daily as needed.  Plan to follow up with Dr. Fermin Schwab in two months or sooner if needed.  Please call for any questions or concerns.  Lactulose oral solution  What is this medicine? LACTULOSE (LAK tyoo lose) is a laxative derived from lactose. It helps to treat chronic constipation and to treat or prevent hepatic encephalopathy or coma. These are brain disorders that result from liver disease. This medicine may be used for other purposes; ask your health care provider or pharmacist if you have questions. COMMON BRAND NAME(S): Acilac, Cephulac, Cholac, Chronulac, Constilac, Constulose, Enulose, Generlac What should I tell my health care provider before I take this medicine? They need to know if you have any of these conditions: -need a galactose-free diet -scheduled for surgery -an unusual or allergic reaction to lactulose, other sugars, medicines, foods, dyes or preservatives -pregnant or trying to get pregnant -breast-feeding How should I use this medicine? Take this medicine mouth. Follow the directions on the prescription label. Shake well before using. Use a specially marked spoon or container to measure your medicine. Ask your pharmacist if you do not have one. Household spoons are not accurate. Take your doses at regular intervals. Do not take your medicine more often than directed. You may be directed to take this medicine rectally. If so, you must follow specific directions from your doctor or healthcare professional. Please contact him or her. Talk to your pediatrician regarding the use of this medicine in children. Special care may be needed. Overdosage: If you think you have taken too  much of this medicine contact a poison control center or emergency room at once. NOTE: This medicine is only for you. Do not share this medicine with others. What if I miss a dose? If you miss a dose, take it as soon as you can. If it is almost time for your next dose, take only that dose. Do not take double or extra doses. What may interact with this medicine? -antacids -neomycin -other laxatives This list may not describe all possible interactions. Give your health care provider a list of all the medicines, herbs, non-prescription drugs, or dietary supplements you use. Also tell them if you smoke, drink alcohol, or use illegal drugs. Some items may interact with your medicine. What should I watch for while using this medicine? This medicine may not produce any result for 24 to 48 hours. Do not take this medicine for longer than directed by your doctor or health care professional. Drink plenty of water with each dose of this medicine. What side effects may I notice from receiving this medicine? Side effects that you should report to your doctor or health care professional as soon as possible: -diarrhea Side effects that usually do not require medical attention (report to your doctor or health care professional if they continue or are bothersome): -belching, flatulence -nausea or vomiting -stomach pain or discomfort This list may not describe all possible side effects. Call your doctor for medical advice about side effects. You may report side effects to FDA at 1-800-FDA-1088. Where should I keep my medicine? Keep out of the reach of children. This medicine may darken in color under normal storage conditions. This is because  of the sugar solution and does not affect the way the medicine works. If the solution becomes extremely dark in color, contact your health care professional before use. Store at room temperature between 15 and 30 degrees C (59 and 86 degrees F). Do not freeze. Keep  container tightly closed. Throw away any unused medicine after the expiration date. NOTE: This sheet is a summary. It may not cover all possible information. If you have questions about this medicine, talk to your doctor, pharmacist, or health care provider.  2014, [employer]. (2008-06-16 16:04:57)

## 2014-03-30 ENCOUNTER — Ambulatory Visit: Payer: Self-pay | Admitting: Endocrinology

## 2014-03-31 ENCOUNTER — Other Ambulatory Visit: Payer: Self-pay | Admitting: *Deleted

## 2014-03-31 MED ORDER — FUROSEMIDE 20 MG PO TABS: 20.0000 mg | ORAL_TABLET | Freq: Every day | ORAL | Status: DC

## 2014-04-01 ENCOUNTER — Ambulatory Visit (INDEPENDENT_AMBULATORY_CARE_PROVIDER_SITE_OTHER): Payer: Medicare Other | Admitting: Pharmacist

## 2014-04-01 DIAGNOSIS — Z5181 Encounter for therapeutic drug level monitoring: Secondary | ICD-10-CM

## 2014-04-01 DIAGNOSIS — I4891 Unspecified atrial fibrillation: Secondary | ICD-10-CM

## 2014-04-01 DIAGNOSIS — I639 Cerebral infarction, unspecified: Secondary | ICD-10-CM

## 2014-04-01 DIAGNOSIS — I6350 Cerebral infarction due to unspecified occlusion or stenosis of unspecified cerebral artery: Secondary | ICD-10-CM

## 2014-04-01 LAB — POCT INR: INR: 2.2

## 2014-04-06 ENCOUNTER — Encounter: Payer: Self-pay | Admitting: Endocrinology

## 2014-04-06 ENCOUNTER — Other Ambulatory Visit: Payer: Self-pay | Admitting: *Deleted

## 2014-04-06 ENCOUNTER — Ambulatory Visit (INDEPENDENT_AMBULATORY_CARE_PROVIDER_SITE_OTHER): Payer: Medicare Other | Admitting: Endocrinology

## 2014-04-06 VITALS — BP 110/60 | HR 98 | Temp 98.4°F | Resp 16 | Ht 64.0 in | Wt 147.4 lb

## 2014-04-06 DIAGNOSIS — I6350 Cerebral infarction due to unspecified occlusion or stenosis of unspecified cerebral artery: Secondary | ICD-10-CM

## 2014-04-06 DIAGNOSIS — E1165 Type 2 diabetes mellitus with hyperglycemia: Principal | ICD-10-CM

## 2014-04-06 DIAGNOSIS — N2581 Secondary hyperparathyroidism of renal origin: Secondary | ICD-10-CM

## 2014-04-06 DIAGNOSIS — E1129 Type 2 diabetes mellitus with other diabetic kidney complication: Secondary | ICD-10-CM

## 2014-04-06 DIAGNOSIS — E039 Hypothyroidism, unspecified: Secondary | ICD-10-CM

## 2014-04-06 MED ORDER — INSULIN PEN NEEDLE 32G X 4 MM MISC: Status: DC

## 2014-04-06 MED ORDER — CALCITRIOL 0.25 MCG PO CAPS: ORAL_CAPSULE | ORAL | Status: DC

## 2014-04-06 NOTE — Patient Instructions (Signed)
Levemir 22 in the morning and 16 in the evening at dinner  Humalog 14-- 18 lunch --22 supper

## 2014-04-06 NOTE — Progress Notes (Signed)
Patient ID: [name], female   DOB: 1926/11/28, 80 y.o.   MRN: [id number]   Reason for Appointment: Low blood sugar  History of Present Illness   Diagnosis: Type 2 DIABETES MELITUS, long-standing  Previous history: She has been on insulin for several years and usually is compliant with her basal bolus regimen. However she usually has significant fluctuation in her blood sugars and difficult to have consistent trend. Overall blood sugars are usually high after meals  Insulin regimen: Levemir 24 in the morning and 18 in the evening at dinner Humalog 12-- 18--26 [facility]   RECENT history:    She had problems with low blood sugars on the last visit including an episode of unexpected severe hypoglycemia Because of this her Levemir was reduced by 6 units in the morning and also some reduction in other doses was done also Tablet sugars over the last 2 weeks have been quite variable with periodic high and low readings Current blood sugar patterns:  Fasting blood sugars have been overall fairly good with recently low normal readings  Blood sugars are mostly higher at lunchtime especially if morning sugars high  Has only a few readings at suppertime but they appear to be recently lowered with lowest reading 52  Bedtime readings are recently low normal although she had 3 days with somewhat high readings not explained by any change in diet, unusual pain or steroid use She again does not think she has had a decrease in her food intake and does not think she has any difficulty remembering to take her insulin on time  A1c has been consistently over 8% but significantly better on the last visit Proper timing of medications in relation to meals: Yes.          Monitors blood glucose:  3 times a day.    Glucometer: One Touch.          Blood Glucose readings recently from meter download:  Average blood sugars overall 127 with lowest at suppertime of 88 and highest of 156 late evening  Meals: 3 meals per  day.  for breakfast  will have an egg and toast but sometimes cereal. Suppertime about 5:30 p.m.    Physical activity:  minimal           Complications: are: Nephropathy    Eye exams: Have been regular   Wt Readings from Last 3 Encounters:  04/06/14 147 lb 6.4 oz (66.86 kg)  03/09/14 148 lb 9.6 oz (67.405 kg)  03/04/14 148 lb 9.6 oz (67.405 kg)   DIABETES labs  Lab Results  Component Value Date   HGBA1C 7.9* 03/04/2014   HGBA1C 8.9* 11/24/2013   HGBA1C 9.5* 07/29/2013   Lab Results  Component Value Date   MICROALBUR 3.8* 04/06/2014   LDLCALC 52 04/06/2014   CREATININE 1.4* 04/06/2014      Medication List       This list is accurate as of: 04/06/14 11:59 PM.  Always use your most recent med list.               atorvastatin 20 MG tablet  Commonly known as:  LIPITOR  Take 1 tablet (20 mg total) by mouth at bedtime.     calcitRIOL 0.25 MCG capsule  Commonly known as:  ROCALTROL  Take 1 capsule 3 times a week     CARTIA XT 180 MG 24 hr capsule  Generic drug:  diltiazem  TAKE 1 CAPSULE DAILY     diltiazem 360 MG  24 hr capsule  Commonly known as:  CARDIZEM CD  Take 180 mg by mouth daily.     furosemide 20 MG tablet  Commonly known as:  LASIX  Take 1 tablet (20 mg total) by mouth daily.     glucose blood test strip  Commonly known as:  ONE TOUCH TEST STRIPS  Use as instructed to check blood sugars 4 times per day dx code 250.00     HUMALOG KWIKPEN 100 UNIT/ML KiwkPen  Generic drug:  insulin lispro  INJECT 20 TO 28 UNITS UNDER THE SKIN THREE TIMES A DAY WITH MEALS (20UNITS AT BREAKFAST, 25 UNITS AT LUNCH, AND 30 UNITS AT SUPPER)     insulin detemir 100 UNIT/ML injection  Commonly known as:  LEVEMIR  Inject 20 Units into the skin 2 (two) times daily. 30 in am and 20 in pm     Insulin Pen Needle 32G X 4 MM Misc  Use 5 needles per day     lactulose 10 GM/15ML solution  Commonly known as:  CHRONULAC     levothyroxine 100 MCG tablet  Commonly known as:  SYNTHROID,  LEVOTHROID  TAKE 1 TABLET EVERY DAY BUT SUNDAY     lidocaine 5 % ointment  Commonly known as:  XYLOCAINE     metoprolol succinate 25 MG 24 hr tablet  Commonly known as:  TOPROL-XL  TAKE 2 TABLETS DAILY     oxyCODONE 5 MG immediate release tablet  Commonly known as:  Oxy IR/ROXICODONE     potassium chloride 10 MEQ tablet  Commonly known as:  KLOR-CON M10  Take 1 tablet (10 mEq total) by mouth daily.     silver sulfADIAZINE 1 % cream  Commonly known as:  SILVADENE  Apply 1 application topically daily.     warfarin 5 MG tablet  Commonly known as:  COUMADIN  Take as directed by Anticoagulation clinic        Allergies:  Allergies  Allergen Reactions  . Tenex [Guanfacine Hcl] Other (See Comments)    hypotension  . Crestor [Rosuvastatin] Other (See Comments)    Myalgia    Past Medical History  Diagnosis Date  . Diabetes mellitus   . Hypertension   . Hyperlipidemia   . Obesity   . Hypothyroidism   . Vulvar cancer, carcinoma 11/03/2011  . Stroke 05/2000    right brain CVA pre H&P 2001  . Atrial fibrillation   . Cancer 2007    vulva-invasive well diff squamous cell ca  . CVA (cerebral infarction)   . Dyslipidemia   . History of radiation therapy 10/20/2013-12/01/2013    60 gray to perineum area and lower pelvis    Past Surgical History  Procedure Laterality Date  . Vulvar lesion removal  2012  . Breast surgery      Lumpectomy  . Dilation and curettage of uterus    . Cystourethroplasty / ureteroneocystostomy    . Radical vulvectomy    . Lymphadenectomy  2007    R inguinal femoral  . Radical vulvectomy  02/27/12  . Vulvar lesion removal  02/27/2012    Procedure: VULVAR LESION;  Surgeon: Alvino Chapel, MD;  Location: [facility];  Service: Gynecology;  Laterality: N/A;  . Right carotid endarterctomy    . Left eye surgery      , permanently stitched eye together    Family History  Problem Relation Age of Onset  . Diabetes type II    . Coronary artery  disease Mother   .  Lung cancer Brother   . Diabetes type II Brother     Social History:  reports that she quit smoking about 46 years ago. Her smoking use included Cigarettes. She has a 4.5 pack-year smoking history. She does not have any smokeless tobacco history on file. She reports that she does not drink alcohol or use illicit drugs.  Review of Systems:  HYPERTENSION:  her blood pressure  as well  controlled with diltiazem and metoprolol, also being used for heart  rate control. Blood pressure tends to be low normal without lightheadedness.  She has a history of atrial fibrillation, taking Coumadin. Does not have a followup with cardiologist scheduled   HYPERLIPIDEMIA: well-controlled  She has a long-standing history of hypothyroidism   Lab Results  Component Value Date   TSH 0.90 04/06/2014      Chronic kidney disease/nephropathy: Has had mild increase in creatinine  which is stable.  Lab Results  Component Value Date   CREATININE 1.4* 04/06/2014   She has history of proteinuria but microalbumin  more recently Normal Previous PTH 136, started on calcitriol 3 times a week and repeat level pending   Last foot exam: 11/2013  No significant neuropathy on exam but she has decreased pulses  She is again having some problems with her vulvar carcinoma. Recently has been seen by her gynecologist    Examination:   BP 110/60  Pulse 98  Temp(Src) 98.4 F (36.9 C)  Resp 16  Ht 5\' 4"  (1.626 m)  Wt 147 lb 6.4 oz (66.86 kg)  BMI 25.29 kg/m2  SpO2 92%  Body mass index is 25.29 kg/(m^2).   No pedal edema  ASSESSMENT/ PLAN:   Diabetes type 2   She again has fluctuating blood sugars although overall relatively better Has had tendency to low normal readings before breakfast and supper in the last 5 days See history of present illness for detailed analysis of her blood sugar patterns, current management  She is usually consistent with diet and her insulin Will need to reduce  her Levemir insulin but increase her morning breakfast coverage to control higher readings at lunchtime  New insulin doses: Levemir 22 in the morning and 16 in the evening at dinner  Humalog 14-- 18 lunch --22 supper   Secondary hyperparathyroidism: Taking low dose calcitriol and repeat PTH and renal function to be checked No history of pathological fractures.  Hypertension: This is mild and well controlled with her antiarrhythmic drugs  Hypothyroidism: Her TSH will be checked for stability, has been compliant with her does of 100 mcg   Elayne Snare 04/07/2014, 2:36 PM

## 2014-04-07 LAB — BASIC METABOLIC PANEL
BUN: 22 mg/dL (ref 6–23)
CO2: 24 mEq/L (ref 19–32)
Calcium: 8.9 mg/dL (ref 8.4–10.5)
Chloride: 104 mEq/L (ref 96–112)
Creatinine, Ser: 1.4 mg/dL — ABNORMAL HIGH (ref 0.4–1.2)
GFR: 37.93 mL/min — AB (ref >60.00)
Glucose, Bld: 238 mg/dL — ABNORMAL HIGH (ref 70–99)
POTASSIUM: 4.1 meq/L (ref 3.5–5.1)
Sodium: 137 mEq/L (ref 135–145)

## 2014-04-07 LAB — LIPID PANEL
CHOLESTEROL: 114 mg/dL (ref 0–200)
HDL: 28.7 mg/dL — ABNORMAL LOW (ref >39.00)
LDL Cholesterol: 52 mg/dL (ref 0–99)
Total CHOL/HDL Ratio: 4
Triglycerides: 167 mg/dL — ABNORMAL HIGH (ref 0.0–149.0)
VLDL: 33.4 mg/dL (ref 0.0–40.0)

## 2014-04-07 LAB — MICROALBUMIN / CREATININE URINE RATIO
Creatinine,U: 32.7 mg/dL
Microalb Creat Ratio: 11.6 mg/g (ref 0.0–30.0)
Microalb, Ur: 3.8 mg/dL — ABNORMAL HIGH (ref 0.0–1.9)

## 2014-04-07 LAB — T4, FREE: Free T4: 1.42 ng/dL (ref 0.60–1.60)

## 2014-04-07 LAB — TSH: TSH: 0.9 u[IU]/mL (ref 0.35–5.50)

## 2014-04-09 LAB — PARATHYROID HORMONE, INTACT (NO CA): PTH: 55 pg/mL (ref 15–65)

## 2014-04-15 ENCOUNTER — Encounter: Payer: Self-pay | Admitting: Radiation Oncology

## 2014-04-15 ENCOUNTER — Ambulatory Visit
Admission: RE | Admit: 2014-04-15 | Discharge: 2014-04-15 | Disposition: A | Discharge status: Home / Self Care | Payer: Medicare Other | Source: Ambulatory Visit | Attending: Radiation Oncology | Admitting: Radiation Oncology

## 2014-04-15 VITALS — BP 117/71 | HR 106 | Temp 97.5°F | Ht 64.0 in | Wt 143.8 lb

## 2014-04-15 DIAGNOSIS — C519 Malignant neoplasm of vulva, unspecified: Secondary | ICD-10-CM

## 2014-04-15 MED ORDER — OXYCODONE HCL 5 MG PO TABS
5.0000 mg | ORAL_TABLET | ORAL | Status: DC | PRN
Start: 2014-04-15 — End: 2014-05-01

## 2014-04-15 NOTE — Progress Notes (Signed)
[name] here for follow up after treatment to her lower pelvis.  She is reporting pain due to an ulcer in her rectal area.  She says the pain is severe when she first sits down and when she tries to walk.  She is taking oxycodone 5 mg 1-2 times per day.  She is also using the lidocaine cream occasionally.  She reports constipation and says she has tried everything.  She is currently taking lactulose once a week and tried using a fleets enema on Monday.  She had a small bm yesterday.  She denies rectal/vaginal bleeding.  She denies nausea.  She does have a poor appetite.  She has lost 4 lbs since 04/06/14.  She is requesting that an appointment be made with Dr. Fermin Schwab today.

## 2014-04-15 NOTE — Progress Notes (Signed)
Radiation Oncology         [phone] ________________________________  Name: [name] MRN: [id number]  Date: 04/15/2014  DOB: Sep 02, 1926  Follow-Up Visit Note  CC: Elayne Snare, MD  Marti Sleigh *  Diagnosis:  Recurrent vulvar carcinoma  Interval Since Last Radiation:  4  months  Narrative:  The patient returns today for routine follow-up.  She continues to have significant pain in the perineum area. This is particularly painful when she first sits down and with walking. She also reports problems with constipation. With further questioning it would appear she is not on a good bowel regimen and I discussed the importance of this issue for constipation. She was seen recently by Dr. Fermin Schwab who noted a healing ulcer in the perianal area.                              ALLERGIES:  is allergic to tenex and crestor.  Meds: Current Outpatient Prescriptions  Medication Sig Dispense Refill  . atorvastatin (LIPITOR) 20 MG tablet Take 1 tablet (20 mg total) by mouth at bedtime.  30 tablet  6  . calcitRIOL (ROCALTROL) 0.25 MCG capsule Take 1 capsule 3 times a week  12 capsule  3  . CARTIA XT 180 MG 24 hr capsule TAKE 1 CAPSULE DAILY  90 capsule  5  . diltiazem (CARDIZEM CD) 360 MG 24 hr capsule Take 180 mg by mouth daily.       . furosemide (LASIX) 20 MG tablet Take 1 tablet (20 mg total) by mouth daily.  30 tablet  3  . glucose blood (ONE TOUCH TEST STRIPS) test strip Use as instructed to check blood sugars 4 times per day dx code 250.00  150 each  12  . insulin detemir (LEVEMIR) 100 UNIT/ML injection Inject 20 Units into the skin 2 (two) times daily. 30 in am and 20 in pm      . insulin lispro (HUMALOG KWIKPEN) 100 UNIT/ML KiwkPen INJECT 20 TO 28 UNITS UNDER THE SKIN THREE TIMES A DAY WITH MEALS (20UNITS AT BREAKFAST, 25 UNITS AT LUNCH, AND 30 UNITS AT SUPPER)      . Insulin Pen Needle 32G X 4 MM MISC Use 5 needles per day  150 each  3  . lactulose (CHRONULAC) 10 GM/15ML  solution       . levothyroxine (SYNTHROID, LEVOTHROID) 100 MCG tablet TAKE 1 TABLET EVERY DAY BUT SUNDAY  90 tablet  2  . lidocaine (XYLOCAINE) 5 % ointment       . metoprolol succinate (TOPROL-XL) 25 MG 24 hr tablet TAKE 2 TABLETS DAILY  180 tablet  2  . oxyCODONE (OXY IR/ROXICODONE) 5 MG immediate release tablet Take 1 tablet (5 mg total) by mouth every 4 (four) hours as needed for severe pain.  30 tablet  0  . potassium chloride (KLOR-CON M10) 10 MEQ tablet Take 1 tablet (10 mEq total) by mouth daily.  30 tablet  5  . silver sulfADIAZINE (SILVADENE) 1 % cream Apply 1 application topically daily.      [name] warfarin (COUMADIN) 5 MG tablet Take as directed by Anticoagulation clinic  30 tablet  3   No current facility-administered medications for this encounter.    Physical Findings: The patient is in no acute distress. Patient is alert and oriented. Accompanied by her husband on evaluation today  height is 5\' 4"  (1.626 m) and weight is 143 lb 12.8 oz (65.227  kg). Her temperature is 97.5 F (36.4 C). Her blood pressure is 117/71 and her pulse is 106. Her oxygen saturation is 97%. .  The patient has area of ulceration versus necrotic tumor in the perineum area. Patient is exquisitely tender with palpation in this area.  Lab Findings: Lab Results  Component Value Date   WBC 8.2 03/04/2014   HGB 12.4 03/04/2014   HCT 38.2 03/04/2014   MCV 99.5 03/04/2014   PLT 190.0 03/04/2014      Radiographic Findings: No results found.  Impression: Recurrent/persistent disease versus ulcer.  Plan:  Patient will be seen by gynecologic oncology early next month For further evaluation.    ____________________________________ Blair Promise, MD

## 2014-04-20 ENCOUNTER — Telehealth: Payer: Self-pay | Admitting: *Deleted

## 2014-04-20 NOTE — Telephone Encounter (Signed)
Pt called to r/s appt with Dr. C-P. Pt gave reason due to husband has an appt on same day at [facility] 05/13/14. Discussed with pt Dr.C-P will not have any available appts until may 29th. Pt states she needs to see him sooner. Gave pt number to [facility] per her request to make appt with MD.

## 2014-04-28 ENCOUNTER — Other Ambulatory Visit: Payer: Self-pay | Admitting: Gynecologic Oncology

## 2014-04-28 ENCOUNTER — Encounter: Payer: Self-pay | Admitting: Gynecology

## 2014-04-28 ENCOUNTER — Telehealth: Payer: Self-pay | Admitting: *Deleted

## 2014-04-28 ENCOUNTER — Ambulatory Visit: Payer: Medicare Other | Attending: Gynecology | Admitting: Gynecology

## 2014-04-28 VITALS — BP 95/64 | HR 123 | Temp 98.5°F | Resp 24 | Wt 139.6 lb

## 2014-04-28 DIAGNOSIS — Z923 Personal history of irradiation: Secondary | ICD-10-CM | POA: Insufficient documentation

## 2014-04-28 DIAGNOSIS — C519 Malignant neoplasm of vulva, unspecified: Secondary | ICD-10-CM

## 2014-04-28 DIAGNOSIS — Z9079 Acquired absence of other genital organ(s): Secondary | ICD-10-CM | POA: Insufficient documentation

## 2014-04-28 DIAGNOSIS — G893 Neoplasm related pain (acute) (chronic): Secondary | ICD-10-CM

## 2014-04-28 DIAGNOSIS — K5900 Constipation, unspecified: Secondary | ICD-10-CM | POA: Insufficient documentation

## 2014-04-28 DIAGNOSIS — K6289 Other specified diseases of anus and rectum: Secondary | ICD-10-CM | POA: Insufficient documentation

## 2014-04-28 MED ORDER — MORPHINE SULFATE ER 10 MG PO CP24: 10.0000 mg | ORAL_CAPSULE | Freq: Every day | ORAL | Status: DC

## 2014-04-28 NOTE — Addendum Note (Signed)
Addended by: Lucile Crater on: 04/28/2014 05:43 PM   Modules accepted: Orders, Medications

## 2014-04-28 NOTE — Telephone Encounter (Signed)
[employer] denied pt's rx for oxycotin 10nmg BID. New rx for morphine 10mg  ER 1 q 24hrs written for pt. Called pt's husband advised new rx written and can be picked up tomorrow. Mr Biswell verbalized understanding.

## 2014-04-28 NOTE — Progress Notes (Signed)
Spoke with insurance company.  Representative stating the patient needs to try morphine extended release, hydromorphone extended release, or oxymorphone extended release before oxycontin would be approved.  Patient to try morphine extended release.

## 2014-04-28 NOTE — Addendum Note (Signed)
Addended by: Lucile Crater on: 04/28/2014 03:26 PM   Modules accepted: Orders

## 2014-04-28 NOTE — Progress Notes (Signed)
Consult Note: Gyn-Onc   [name] 80 y.o. female  Chief Complaint  Patient presents with  . Vulvar Cancer   assessment t: Recurrent vulvar cancer. Severe anal pain.. Constipation  Plan: A biopsy is obtained (however there is a small specimen given the patient's pain and may be nondiagnostic). In either event, (either recurrent persistent vulvar cancer or a radiation ulcer) ablator the only significant therapeutic option would be to resect the vulvar and anal lesion and create a diverting colostomy. Patient has a number of medical comorbidities. We will have her see a cardiologist tomorrow and I have contacted Dr.Mark Etter Sjogren and [facility] to evaluate [name] with regard to potential for surgery.  We will tentatively schedule surgery for May 19. The extent of the procedure is discussed with the patient and her husband and their agreement to proceed if medically reasonable. The alternative would be to manage her pain and arrange for hospice care.  Patient given a prescription for OxyContin 10 mg every 12 hours and an additional prescription for oxycodone 5 mg every 4 hours as needed for breakthrough pain. We'll have the patient see the enterostomal therapy nurse for marking and preliminary education about colostomy care. Interval History: Patient returns today because of increasing pain in the posterior vulva and anus. Patient is been taking oxycodone but has not really receiving much pain relief. The pain is worse than when I saw her last period she denies any bleeding. She is having difficulty with constipation probably secondary to using oxycodone as well as pain around her anus. Otherwise her health has been stable.   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 At that time she underwent a modified radical vulvectomy and right inguinal lymphadenectomy. Lymph nodes and surgical margins were negative. She subsequently developed a recurrence on the left vulva in  October 2008 underwent left modified radical vulvectomy. Given her age and overall poor performance status we did not perform an inguinal lymphadenectomy.  The patient subsequently had a third recurrence of October 2009 and this was excised and the rhomboid flap was used to fill the surgical defect. Surgical margins were again negative. Her most recent recurrences in September 2012 which is again excised.  She had a superficial wound separation which closed by secondary intent.   She had yet another recurrence in November 2014 treated with external beam radiation therapy because of the lesion location very close to the anus.     Review of System Ten point review of systems is negative except as noted above      Allergies  Allergen Reactions  . Tenex [Guanfacine Hcl] Other (See Comments)    hypotension  . Crestor [Rosuvastatin] Other (See Comments)    Myalgia    Past Medical History  Diagnosis Date  . Diabetes mellitus   . Hypertension   . Hyperlipidemia   . Obesity   . Hypothyroidism   . Vulvar cancer, carcinoma 11/03/2011  . Stroke 05/2000    right brain CVA pre H&P 2001  . Atrial fibrillation   . Cancer 2007    vulva-invasive well diff squamous cell ca  . CVA (cerebral infarction)   . Dyslipidemia   . History of radiation therapy 10/20/2013-12/01/2013    60 gray to perineum area and lower pelvis    Past Surgical History  Procedure Laterality Date  . Vulvar lesion removal  2012  . Breast surgery      Lumpectomy  . Dilation and curettage of uterus    .  Cystourethroplasty / ureteroneocystostomy    . Radical vulvectomy    . Lymphadenectomy  2007    R inguinal femoral  . Radical vulvectomy  02/27/12  . Vulvar lesion removal  02/27/2012    Procedure: VULVAR LESION;  Surgeon: Alvino Chapel, MD;  Location: [facility];  Service: Gynecology;  Laterality: N/A;  . Right carotid endarterctomy    . Left eye surgery      , permanently stitched eye together    Current  Outpatient Prescriptions  Medication Sig Dispense Refill  . atorvastatin (LIPITOR) 20 MG tablet Take 1 tablet (20 mg total) by mouth at bedtime.  30 tablet  6  . calcitRIOL (ROCALTROL) 0.25 MCG capsule Take 1 capsule 3 times a week  12 capsule  3  . CARTIA XT 180 MG 24 hr capsule TAKE 1 CAPSULE DAILY  90 capsule  5  . diltiazem (CARDIZEM CD) 360 MG 24 hr capsule Take 180 mg by mouth daily.       . furosemide (LASIX) 20 MG tablet Take 1 tablet (20 mg total) by mouth daily.  30 tablet  3  . glucose blood (ONE TOUCH TEST STRIPS) test strip Use as instructed to check blood sugars 4 times per day dx code 250.00  150 each  12  . insulin detemir (LEVEMIR) 100 UNIT/ML injection Inject 20 Units into the skin 2 (two) times daily. 30 in am and 20 in pm      . insulin lispro (HUMALOG KWIKPEN) 100 UNIT/ML KiwkPen INJECT 20 TO 28 UNITS UNDER THE SKIN THREE TIMES A DAY WITH MEALS (20UNITS AT BREAKFAST, 25 UNITS AT LUNCH, AND 30 UNITS AT SUPPER)      . Insulin Pen Needle 32G X 4 MM MISC Use 5 needles per day  150 each  3  . lactulose (CHRONULAC) 10 GM/15ML solution       . levothyroxine (SYNTHROID, LEVOTHROID) 100 MCG tablet TAKE 1 TABLET EVERY DAY BUT SUNDAY  90 tablet  2  . lidocaine (XYLOCAINE) 5 % ointment       . metoprolol succinate (TOPROL-XL) 25 MG 24 hr tablet TAKE 2 TABLETS DAILY  180 tablet  2  . oxyCODONE (OXY IR/ROXICODONE) 5 MG immediate release tablet Take 1 tablet (5 mg total) by mouth every 4 (four) hours as needed for severe pain.  30 tablet  0  . potassium chloride (KLOR-CON M10) 10 MEQ tablet Take 1 tablet (10 mEq total) by mouth daily.  30 tablet  5  . silver sulfADIAZINE (SILVADENE) 1 % cream Apply 1 application topically daily.      [name] warfarin (COUMADIN) 5 MG tablet Take as directed by Anticoagulation clinic  30 tablet  3   No current facility-administered medications for this visit.    History   Social History  . Marital Status: Married    Spouse Name: N/A    Number of Children:  N/A  . Years of Education: N/A   Occupational History  . Not on file.   Social History Main Topics  . Smoking status: Former Smoker -- 0.25 packs/day for 18 years    Types: Cigarettes    Quit date: 11/03/1967  . Smokeless tobacco: Not on file  . Alcohol Use: No  . Drug Use: No  . Sexual Activity: Not Currently   Other Topics Concern  . Not on file   Social History Narrative  . No narrative on file    Family History  Problem Relation Age of Onset  . Diabetes  type II    . Coronary artery disease Mother   . Lung cancer Brother   . Diabetes type II Brother        Vitals: Blood pressure 95/64, pulse 123, temperature 98.5 F (36.9 C), resp. rate 24.  Physical Exam: In general the patient is a pleasant elderly white female no acute distress.  HEENT is negative except that her left eyelid has been partially closed surgically.. Neck is supple without thyromegaly.  There is no supraclavicular or inguinal adenopathy.  The abdomen is soft nontender no masses organomegaly or ascites is noted.  Pelvic exam EGBUS is status post radical vulvectomy with advancement flaps. There is an ulcerated area on the left perianal region where the recurrence recently was treated with radiation therapy. Certainly the exophytic portion of the tumor has resolved.  At the present time, I presume this is still recovering from radiation therapy. No new lesions are noted. On palpation area is painful.     Alvino Chapel, MD 04/28/2014, 12:57 PM                         Consult Note: Gyn-Onc   [name] 80 y.o. female  Chief Complaint  Patient presents with  . Vulvar Cancer    Interval History:   HPI:  Allergies  Allergen Reactions  . Tenex [Guanfacine Hcl] Other (See Comments)    hypotension  . Crestor [Rosuvastatin] Other (See Comments)    Myalgia    Past Medical History  Diagnosis Date  . Diabetes mellitus   . Hypertension   . Hyperlipidemia    . Obesity   . Hypothyroidism   . Vulvar cancer, carcinoma 11/03/2011  . Stroke 05/2000    right brain CVA pre H&P 2001  . Atrial fibrillation   . Cancer 2007    vulva-invasive well diff squamous cell ca  . CVA (cerebral infarction)   . Dyslipidemia   . History of radiation therapy 10/20/2013-12/01/2013    60 gray to perineum area and lower pelvis    Past Surgical History  Procedure Laterality Date  . Vulvar lesion removal  2012  . Breast surgery      Lumpectomy  . Dilation and curettage of uterus    . Cystourethroplasty / ureteroneocystostomy    . Radical vulvectomy    . Lymphadenectomy  2007    R inguinal femoral  . Radical vulvectomy  02/27/12  . Vulvar lesion removal  02/27/2012    Procedure: VULVAR LESION;  Surgeon: Alvino Chapel, MD;  Location: [facility];  Service: Gynecology;  Laterality: N/A;  . Right carotid endarterctomy    . Left eye surgery      , permanently stitched eye together    Current Outpatient Prescriptions  Medication Sig Dispense Refill  . atorvastatin (LIPITOR) 20 MG tablet Take 1 tablet (20 mg total) by mouth at bedtime.  30 tablet  6  . calcitRIOL (ROCALTROL) 0.25 MCG capsule Take 1 capsule 3 times a week  12 capsule  3  . CARTIA XT 180 MG 24 hr capsule TAKE 1 CAPSULE DAILY  90 capsule  5  . diltiazem (CARDIZEM CD) 360 MG 24 hr capsule Take 180 mg by mouth daily.       . furosemide (LASIX) 20 MG tablet Take 1 tablet (20 mg total) by mouth daily.  30 tablet  3  . glucose blood (ONE TOUCH TEST STRIPS) test strip Use as instructed to check blood sugars 4  times per day dx code 250.00  150 each  12  . insulin detemir (LEVEMIR) 100 UNIT/ML injection Inject 20 Units into the skin 2 (two) times daily. 30 in am and 20 in pm      . insulin lispro (HUMALOG KWIKPEN) 100 UNIT/ML KiwkPen INJECT 20 TO 28 UNITS UNDER THE SKIN THREE TIMES A DAY WITH MEALS (20UNITS AT BREAKFAST, 25 UNITS AT LUNCH, AND 30 UNITS AT SUPPER)      . Insulin Pen Needle 32G X 4 MM MISC  Use 5 needles per day  150 each  3  . lactulose (CHRONULAC) 10 GM/15ML solution       . levothyroxine (SYNTHROID, LEVOTHROID) 100 MCG tablet TAKE 1 TABLET EVERY DAY BUT SUNDAY  90 tablet  2  . lidocaine (XYLOCAINE) 5 % ointment       . metoprolol succinate (TOPROL-XL) 25 MG 24 hr tablet TAKE 2 TABLETS DAILY  180 tablet  2  . oxyCODONE (OXY IR/ROXICODONE) 5 MG immediate release tablet Take 1 tablet (5 mg total) by mouth every 4 (four) hours as needed for severe pain.  30 tablet  0  . potassium chloride (KLOR-CON M10) 10 MEQ tablet Take 1 tablet (10 mEq total) by mouth daily.  30 tablet  5  . silver sulfADIAZINE (SILVADENE) 1 % cream Apply 1 application topically daily.      [name] warfarin (COUMADIN) 5 MG tablet Take as directed by Anticoagulation clinic  30 tablet  3   No current facility-administered medications for this visit.    History   Social History  . Marital Status: Married    Spouse Name: N/A    Number of Children: N/A  . Years of Education: N/A   Occupational History  . Not on file.   Social History Main Topics  . Smoking status: Former Smoker -- 0.25 packs/day for 18 years    Types: Cigarettes    Quit date: 11/03/1967  . Smokeless tobacco: Not on file  . Alcohol Use: No  . Drug Use: No  . Sexual Activity: Not Currently   Other Topics Concern  . Not on file   Social History Narrative  . No narrative on file    Family History  Problem Relation Age of Onset  . Diabetes type II    . Coronary artery disease Mother   . Lung cancer Brother   . Diabetes type II Brother     Review of Systems:  Vitals: Blood pressure 95/64, pulse 123, temperature 98.5 F (36.9 C), resp. rate 24.  Physical Exam:  Assessment/Plan:   Alvino Chapel, MD 04/28/2014, 12:57 PM                         Consult Note: Gyn-Onc   [name] 80 y.o. female  Chief Complaint  Patient presents with  . Vulvar Cancer    Interval History: The patient  returns today  last being seen in April 2013. Subsequent to that visit, she's had a number of medical problems including a stroke, atrial fibrillation, and some ophthalmologic problems. On direct questioning the patient does admit and her husband concurs that there is a new lesion on her vulva. She says it "stings". She has not had any bleeding.  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 At that time she underwent a modified radical vulvectomy and right inguinal lymphadenectomy. Lymph nodes and surgical margins were negative. She subsequently developed a recurrence  on the left vulva in October 2008 underwent left modified radical vulvectomy. Given her age and overall poor performance status we did not perform an inguinal lymphadenectomy.  The patient subsequently had a third recurrence of October 2009 and this was excised and the rhomboid flap was used to fill the surgical defect. Surgical margins were again negative. Her most recent recurrences in September 2012 which is again excised.  She had a superficial wound separation which closed by secondary intent.      Review of System Ten point review of systems is negative except as noted above      Allergies  Allergen Reactions  . Tenex [Guanfacine Hcl] Other (See Comments)    hypotension  . Crestor [Rosuvastatin] Other (See Comments)    Myalgia    Past Medical History  Diagnosis Date  . Diabetes mellitus   . Hypertension   . Hyperlipidemia   . Obesity   . Hypothyroidism   . Vulvar cancer, carcinoma 11/03/2011  . Stroke 05/2000    right brain CVA pre H&P 2001  . Atrial fibrillation   . Cancer 2007    vulva-invasive well diff squamous cell ca  . CVA (cerebral infarction)   . Dyslipidemia   . History of radiation therapy 10/20/2013-12/01/2013    60 gray to perineum area and lower pelvis    Past Surgical History  Procedure Laterality Date  . Vulvar lesion removal  2012  . Breast surgery       Lumpectomy  . Dilation and curettage of uterus    . Cystourethroplasty / ureteroneocystostomy    . Radical vulvectomy    . Lymphadenectomy  2007    R inguinal femoral  . Radical vulvectomy  02/27/12  . Vulvar lesion removal  02/27/2012    Procedure: VULVAR LESION;  Surgeon: Alvino Chapel, MD;  Location: [facility];  Service: Gynecology;  Laterality: N/A;  . Right carotid endarterctomy    . Left eye surgery      , permanently stitched eye together    Current Outpatient Prescriptions  Medication Sig Dispense Refill  . atorvastatin (LIPITOR) 20 MG tablet Take 1 tablet (20 mg total) by mouth at bedtime.  30 tablet  6  . calcitRIOL (ROCALTROL) 0.25 MCG capsule Take 1 capsule 3 times a week  12 capsule  3  . CARTIA XT 180 MG 24 hr capsule TAKE 1 CAPSULE DAILY  90 capsule  5  . diltiazem (CARDIZEM CD) 360 MG 24 hr capsule Take 180 mg by mouth daily.       . furosemide (LASIX) 20 MG tablet Take 1 tablet (20 mg total) by mouth daily.  30 tablet  3  . glucose blood (ONE TOUCH TEST STRIPS) test strip Use as instructed to check blood sugars 4 times per day dx code 250.00  150 each  12  . insulin detemir (LEVEMIR) 100 UNIT/ML injection Inject 20 Units into the skin 2 (two) times daily. 30 in am and 20 in pm      . insulin lispro (HUMALOG KWIKPEN) 100 UNIT/ML KiwkPen INJECT 20 TO 28 UNITS UNDER THE SKIN THREE TIMES A DAY WITH MEALS (20UNITS AT BREAKFAST, 25 UNITS AT LUNCH, AND 30 UNITS AT SUPPER)      . Insulin Pen Needle 32G X 4 MM MISC Use 5 needles per day  150 each  3  . lactulose (CHRONULAC) 10 GM/15ML solution       . levothyroxine (SYNTHROID, LEVOTHROID) 100 MCG tablet TAKE 1 TABLET EVERY DAY BUT  SUNDAY  90 tablet  2  . lidocaine (XYLOCAINE) 5 % ointment       . metoprolol succinate (TOPROL-XL) 25 MG 24 hr tablet TAKE 2 TABLETS DAILY  180 tablet  2  . oxyCODONE (OXY IR/ROXICODONE) 5 MG immediate release tablet Take 1 tablet (5 mg total) by mouth every 4 (four) hours as needed for severe pain.   30 tablet  0  . potassium chloride (KLOR-CON M10) 10 MEQ tablet Take 1 tablet (10 mEq total) by mouth daily.  30 tablet  5  . silver sulfADIAZINE (SILVADENE) 1 % cream Apply 1 application topically daily.      [name] warfarin (COUMADIN) 5 MG tablet Take as directed by Anticoagulation clinic  30 tablet  3   No current facility-administered medications for this visit.    History   Social History  . Marital Status: Married    Spouse Name: N/A    Number of Children: N/A  . Years of Education: N/A   Occupational History  . Not on file.   Social History Main Topics  . Smoking status: Former Smoker -- 0.25 packs/day for 18 years    Types: Cigarettes    Quit date: 11/03/1967  . Smokeless tobacco: Not on file  . Alcohol Use: No  . Drug Use: No  . Sexual Activity: Not Currently   Other Topics Concern  . Not on file   Social History Narrative  . No narrative on file    Family History  Problem Relation Age of Onset  . Diabetes type II    . Coronary artery disease Mother   . Lung cancer Brother   . Diabetes type II Brother        Vitals: Blood pressure 95/64, pulse 123, temperature 98.5 F (36.9 C), resp. rate 24.  Physical Exam: In general the patient is a pleasant elderly white female no acute distress.  HEENT is negative except that her left eyelid has been partially closed surgically.. Neck is supple without thyromegaly.  There is no supraclavicular or inguinal adenopathy.  The abdomen is soft nontender no masses organomegaly or ascites is noted.  Pelvic exam EGBUS is status post radical vulvectomy with advancement flaps. She now has a new 4 cm fungating lesion just to the left of the anus. Assessment/Plan:recurrent vulvar cancer adjacent to the anus.   Given the location of this new lesion, I do not believe that surgery would be appropriate as the patient would require an extensive operation including resection of the anal sphincter colostomy. Therefore would like to  refer her to radiation oncology for consideration of external beam radiation therapy to this lesion. The patient and her husband are in agreement with this plan.   Alvino Chapel, MD 04/28/2014, 12:57 PM                         Consult Note: Gyn-Onc   [name] 80 y.o. female  Chief Complaint  Patient presents with  . Vulvar Cancer    Interval History:   HPI:  Allergies  Allergen Reactions  . Tenex [Guanfacine Hcl] Other (See Comments)    hypotension  . Crestor [Rosuvastatin] Other (See Comments)    Myalgia    Past Medical History  Diagnosis Date  . Diabetes mellitus   . Hypertension   . Hyperlipidemia   . Obesity   . Hypothyroidism   . Vulvar cancer, carcinoma 11/03/2011  . Stroke 05/2000    right brain  CVA pre H&P 2001  . Atrial fibrillation   . Cancer 2007    vulva-invasive well diff squamous cell ca  . CVA (cerebral infarction)   . Dyslipidemia   . History of radiation therapy 10/20/2013-12/01/2013    60 gray to perineum area and lower pelvis    Past Surgical History  Procedure Laterality Date  . Vulvar lesion removal  2012  . Breast surgery      Lumpectomy  . Dilation and curettage of uterus    . Cystourethroplasty / ureteroneocystostomy    . Radical vulvectomy    . Lymphadenectomy  2007    R inguinal femoral  . Radical vulvectomy  02/27/12  . Vulvar lesion removal  02/27/2012    Procedure: VULVAR LESION;  Surgeon: Alvino Chapel, MD;  Location: [facility];  Service: Gynecology;  Laterality: N/A;  . Right carotid endarterctomy    . Left eye surgery      , permanently stitched eye together    Current Outpatient Prescriptions  Medication Sig Dispense Refill  . atorvastatin (LIPITOR) 20 MG tablet Take 1 tablet (20 mg total) by mouth at bedtime.  30 tablet  6  . calcitRIOL (ROCALTROL) 0.25 MCG capsule Take 1 capsule 3 times a week  12 capsule  3  . CARTIA XT 180 MG 24 hr capsule TAKE 1 CAPSULE DAILY  90 capsule  5   . diltiazem (CARDIZEM CD) 360 MG 24 hr capsule Take 180 mg by mouth daily.       . furosemide (LASIX) 20 MG tablet Take 1 tablet (20 mg total) by mouth daily.  30 tablet  3  . glucose blood (ONE TOUCH TEST STRIPS) test strip Use as instructed to check blood sugars 4 times per day dx code 250.00  150 each  12  . insulin detemir (LEVEMIR) 100 UNIT/ML injection Inject 20 Units into the skin 2 (two) times daily. 30 in am and 20 in pm      . insulin lispro (HUMALOG KWIKPEN) 100 UNIT/ML KiwkPen INJECT 20 TO 28 UNITS UNDER THE SKIN THREE TIMES A DAY WITH MEALS (20UNITS AT BREAKFAST, 25 UNITS AT LUNCH, AND 30 UNITS AT SUPPER)      . Insulin Pen Needle 32G X 4 MM MISC Use 5 needles per day  150 each  3  . lactulose (CHRONULAC) 10 GM/15ML solution       . levothyroxine (SYNTHROID, LEVOTHROID) 100 MCG tablet TAKE 1 TABLET EVERY DAY BUT SUNDAY  90 tablet  2  . lidocaine (XYLOCAINE) 5 % ointment       . metoprolol succinate (TOPROL-XL) 25 MG 24 hr tablet TAKE 2 TABLETS DAILY  180 tablet  2  . oxyCODONE (OXY IR/ROXICODONE) 5 MG immediate release tablet Take 1 tablet (5 mg total) by mouth every 4 (four) hours as needed for severe pain.  30 tablet  0  . potassium chloride (KLOR-CON M10) 10 MEQ tablet Take 1 tablet (10 mEq total) by mouth daily.  30 tablet  5  . silver sulfADIAZINE (SILVADENE) 1 % cream Apply 1 application topically daily.      [name] warfarin (COUMADIN) 5 MG tablet Take as directed by Anticoagulation clinic  30 tablet  3   No current facility-administered medications for this visit.    History   Social History  . Marital Status: Married    Spouse Name: N/A    Number of Children: N/A  . Years of Education: N/A   Occupational History  . Not on file.  Social History Main Topics  . Smoking status: Former Smoker -- 0.25 packs/day for 18 years    Types: Cigarettes    Quit date: 11/03/1967  . Smokeless tobacco: Not on file  . Alcohol Use: No  . Drug Use: No  . Sexual Activity: Not  Currently   Other Topics Concern  . Not on file   Social History Narrative  . No narrative on file    Family History  Problem Relation Age of Onset  . Diabetes type II    . Coronary artery disease Mother   . Lung cancer Brother   . Diabetes type II Brother     Review of Systems:  Vitals: Blood pressure 95/64, pulse 123, temperature 98.5 F (36.9 C), resp. rate 24.  Physical Exam:  Assessment/Plan:   Alvino Chapel, MD 04/28/2014, 12:57 PM

## 2014-04-28 NOTE — Patient Instructions (Addendum)
Plan for surgery with Dr. Fermin Schwab on May 19 at [facility] will receive additional instructions from Dr. Marlou Porch at your appointment tomorrow.                Preparing for your Surgery  Pre-operative Testing -You will receive a phone call from presurgical testing at [facility] to arrange for a pre-operative testing appointment before your surgery.  This appointment normally occurs one to two weeks before your scheduled surgery.   -Bring your insurance card, copy of an advanced directive if applicable, medication list  -At that visit, you will be asked to sign a consent for a possible blood transfusion in case a transfusion becomes necessary during surgery.  The need for a blood transfusion is rare but having consent is a necessary part of your care.     Day Before Surgery at [facility] will be asked to take in only clear liquids the day before surgery.  Examples of clear liquids include broths, jello, and clear juices.  You may also be advised to perform a Miralax bowel prep or fleets enema the night before your surgery based off of your provider's recommendations.  You will be advised to have nothing to eat or drink after midnight the evening before.    Your role in recovery Your role is to become active as soon as directed by your doctor, while still giving yourself time to heal.  Rest when you feel tired. You will be asked to do the following in order to speed your recovery:  - Cough and breathe deeply. This helps toclear and expand your lungs and can prevent pneumonia. You may be given a spirometer to practice deep breathing. A staff member will show you how to use the spirometer. - Do mild physical activity. Walking or moving your legs help your circulation and body functions return to normal. A staff member will help you when you try to walk and will provide you with simple exercises. Do not try to get up or walk alone the first time. - Actively manage your  pain. Managing your pain lets you move in comfort. We will ask you to rate your pain on a scale of zero to 10. It is your responsibility to tell your doctor or nurse where and how much you hurt so your pain can be treated.  Special Considerations -If you are diabetic, you may be placed on insulin after surgery to have closer control over your blood sugars to promote healing and recovery.  This does not mean that you will be discharged on insulin.  If applicable, your oral antidiabetics will be resumed when you are tolerating a solid diet.  -Your final pathology results from surgery should be available by the Friday after surgery and the results will be relayed to you when available.  Colostomy Surgery A colostomy surgery is a procedure that redirects a section of the large intestine (colon) to an opening in the abdomen. This opening is called a stoma or ostomy. A bag is attached to the stoma on the outside of the body. This bag collects waste, since the waste can no longer travel through the rest of the colon. Where the stoma is located and what it looks like depends on the type of colostomy performed. A colostomy may be temporary or permanent. The hospital stay after this procedure is typically 3 to 7 days.  LET YOUR CAREGIVER KNOW ABOUT:  Allergies to food or medicine.  Medicines taken, including  vitamins, health supplements, herbs, eyedrops, over-the-counter medicines, and creams.  Use of steroids (by mouth or creams).  Previous problems with anesthetics or numbing medicines.  History of bleeding problems or blood clots.  Previous surgery.  Other health problems, including diabetes and kidney problems.  Possibility of pregnancy, if this applies. RISKS AND COMPLICATIONS General surgical complications may include:  Reaction to anesthesia.  Damage to surrounding nerves, tissues, or structures.  Infection.  Blood clot.  Bleeding.  Scarring.  Pain that lasts longer than 3  months. Specific risks for colostomy, while rare, may include:  Intestinal blockage.  Skin irritation.  Wound opening.  Narrowing or collapsing of the stoma.  Hernia. BEFORE THE PROCEDURE It is important to follow your surgeon's instructions prior to your procedure to avoid complications. Steps before your procedure may include:  A physical exam, blood and urine tests, stool test, X-rays, and other procedures.  Chemotherapy or radiation therapy.  A review of the procedure, the anesthesia being used, and what to expect after the procedure. You may meet with an ostomy advisor. You may be asked to:  Stop taking certain medicines for several days prior to your procedure such as blood thinners (including aspirin).  Take certain medicines, such as antibiotics or stool softeners.  Follow a special diet for several days prior to the procedure and to avoid eating and drinking after midnight the night before the procedure. This will help you to avoid complications from the anesthesia.  Take an antibacterial shower the night before, or the morning of, the procedure.  Quit smoking. Smoking increases the chances of an infection or a healing problem after your procedure. Arrange for someone to drive you home after surgery. You should also arrange to have someone help you with activities while you recover. PROCEDURE There are several types of colostomy procedures. The 2 main procedure types are loop colostomy or end colostomy. During a loop colostomy, the surgeon pulls 2 ends of the intestine out toward the abdomen, using 2 openings. During an end colostomy, the surgeon pulls 1 end of the intestine out toward the abdomen, through 1 opening. You will be given medicine that makes you sleep (general anesthetic).The procedure may be done as open surgery, with a large cut (incision), or as laparoscopic surgery, with several small incisions.  AFTER THE PROCEDURE  You will be given pain  medicine.  You may be able to suck on ice. You may begin drinking clear fluids the next day and begin a normal diet after 2 days, or as directed by your surgeon.  Your stoma will be covered with bandages or a pouch.  Initial drainage from the stoma will be liquid.  The stoma may be dark-colored, swollen, and bruised until it has more time to heal. Document Released: 05/03/2011 Document Revised: 03/04/2012 Document Reviewed: 05/03/2011 [facility] Patient Information 2014 [place].

## 2014-04-29 ENCOUNTER — Ambulatory Visit (INDEPENDENT_AMBULATORY_CARE_PROVIDER_SITE_OTHER): Payer: Medicare Other | Admitting: Pharmacist

## 2014-04-29 ENCOUNTER — Telehealth: Payer: Self-pay | Admitting: Endocrinology

## 2014-04-29 ENCOUNTER — Encounter: Payer: Self-pay | Admitting: Cardiology

## 2014-04-29 ENCOUNTER — Ambulatory Visit (INDEPENDENT_AMBULATORY_CARE_PROVIDER_SITE_OTHER): Payer: Medicare Other | Admitting: Cardiology

## 2014-04-29 VITALS — BP 132/70 | HR 108 | Ht 64.0 in | Wt 140.4 lb

## 2014-04-29 DIAGNOSIS — I6350 Cerebral infarction due to unspecified occlusion or stenosis of unspecified cerebral artery: Secondary | ICD-10-CM

## 2014-04-29 DIAGNOSIS — C801 Malignant (primary) neoplasm, unspecified: Secondary | ICD-10-CM

## 2014-04-29 DIAGNOSIS — Z7901 Long term (current) use of anticoagulants: Secondary | ICD-10-CM

## 2014-04-29 DIAGNOSIS — Z01810 Encounter for preprocedural cardiovascular examination: Secondary | ICD-10-CM

## 2014-04-29 DIAGNOSIS — I4891 Unspecified atrial fibrillation: Secondary | ICD-10-CM

## 2014-04-29 DIAGNOSIS — I639 Cerebral infarction, unspecified: Secondary | ICD-10-CM

## 2014-04-29 DIAGNOSIS — Z5181 Encounter for therapeutic drug level monitoring: Secondary | ICD-10-CM

## 2014-04-29 LAB — POCT INR: INR: 2.5

## 2014-04-29 MED ORDER — ENOXAPARIN SODIUM 60 MG/0.6ML ~~LOC~~ SOLN: 60.0000 mg | SUBCUTANEOUS | Status: DC

## 2014-04-29 MED ORDER — METOPROLOL TARTRATE 50 MG PO TABS: 50.0000 mg | ORAL_TABLET | Freq: Two times a day (BID) | ORAL | Status: DC

## 2014-04-29 NOTE — Progress Notes (Signed)
[facility]. [address], [address], [place]  [address] Phone: [phone] Fax:  [phone]  Date:  04/29/2014   ID:  [name], DOB [birth year], MRN [id number]  PCP:  Lindsay Snare, MD   History of Present Illness: [name] is a 80 y.o. female atrial fibrillation, chronic anticoagulation, chronic shortness of breath with invasive vulvar cancer requiring surgery.   I spoke to Dr. Marti Sleigh who relayed that she may need a more extensive intra-abdominal procedure and wanted to discuss cardiac risk.   Given her prior stroke history, Lovenox bridge should take place.   Her dyspnea has improved with low-dose Lasix although she remains chronically short of breath with chronic fatigue with any amounts of activity.  She is in quite a bit of pain in her perineal region.  Last evening she had a period of hypoglycemia, EMS was called. Glucose was administered. She was fairly unresponsive on the couch, saliva drooling from her mouth. Her husband called EMS. He will be contacting Dr. Dwyane Dee. Blood sugar today was 108.  Wt Readings from Last 3 Encounters:  04/29/14 140 lb 6.4 oz (63.685 kg)  04/28/14 139 lb 9.6 oz (63.322 kg)  04/15/14 143 lb 12.8 oz (65.227 kg)     Past Medical History  Diagnosis Date  . Diabetes mellitus   . Hypertension   . Hyperlipidemia   . Obesity   . Hypothyroidism   . Vulvar cancer, carcinoma 11/03/2011  . Stroke 05/2000    right brain CVA pre H&P 2001  . Atrial fibrillation   . Cancer 2007    vulva-invasive well diff squamous cell ca  . CVA (cerebral infarction)   . Dyslipidemia   . History of radiation therapy 10/20/2013-12/01/2013    60 gray to perineum area and lower pelvis    Past Surgical History  Procedure Laterality Date  . Vulvar lesion removal  2012  . Breast surgery      Lumpectomy  . Dilation and curettage of uterus    . Cystourethroplasty / ureteroneocystostomy    . Radical vulvectomy    . Lymphadenectomy  2007    R  inguinal femoral  . Radical vulvectomy  02/27/12  . Vulvar lesion removal  02/27/2012    Procedure: VULVAR LESION;  Surgeon: Alvino Chapel, MD;  Location: [facility] ORS;  Service: Gynecology;  Laterality: N/A;  . Right carotid endarterctomy    . Left eye surgery      , permanently stitched eye together    Current Outpatient Prescriptions  Medication Sig Dispense Refill  . atorvastatin (LIPITOR) 20 MG tablet Take 1 tablet (20 mg total) by mouth at bedtime.  30 tablet  6  . calcitRIOL (ROCALTROL) 0.25 MCG capsule Take 1 capsule 3 times a week  12 capsule  3  . CARTIA XT 180 MG 24 hr capsule TAKE 1 CAPSULE DAILY  90 capsule  5  . furosemide (LASIX) 20 MG tablet Take 1 tablet (20 mg total) by mouth daily.  30 tablet  3  . glucose blood (ONE TOUCH TEST STRIPS) test strip Use as instructed to check blood sugars 4 times per day dx code 250.00  150 each  12  . insulin detemir (LEVEMIR) 100 UNIT/ML injection Inject into the skin. 16 in am and 28 in pm      . insulin lispro (HUMALOG KWIKPEN) 100 UNIT/ML KiwkPen INJECT 20 TO 28 UNITS UNDER THE SKIN THREE TIMES A DAY WITH MEALS (14UNITS AT [place],  18 UNITS AT LUNCH, AND 26 UNITS AT SUPPER)      . Insulin Pen Needle 32G X 4 MM MISC Use 5 needles per day  150 each  3  . lactulose (CHRONULAC) 10 GM/15ML solution       . levothyroxine (SYNTHROID, LEVOTHROID) 100 MCG tablet TAKE 1 TABLET EVERY DAY BUT SUNDAY  90 tablet  2  . lidocaine (XYLOCAINE) 5 % ointment       . metoprolol succinate (TOPROL-XL) 25 MG 24 hr tablet TAKE 2 TABLETS DAILY  180 tablet  2  . morphine (KADIAN) 10 MG 24 hr capsule Take 1 capsule (10 mg total) by mouth daily.  30 capsule  0  . oxyCODONE (OXY IR/ROXICODONE) 5 MG immediate release tablet Take 1 tablet (5 mg total) by mouth every 4 (four) hours as needed for severe pain.  30 tablet  0  . potassium chloride (KLOR-CON M10) 10 MEQ tablet Take 1 tablet (10 mEq total) by mouth daily.  30 tablet  5  . silver sulfADIAZINE (SILVADENE) 1  % cream Apply 1 application topically daily.      [name] warfarin (COUMADIN) 5 MG tablet Take as directed by Anticoagulation clinic  30 tablet  3   No current facility-administered medications for this visit.    Allergies:    Allergies  Allergen Reactions  . Tenex [Guanfacine Hcl] Other (See Comments)    hypotension  . Crestor [Rosuvastatin] Other (See Comments)    Myalgia    Social History:  The patient  reports that she quit smoking about 46 years ago. Her smoking use included Cigarettes. She has a 4.5 pack-year smoking history. She does not have any smokeless tobacco history on file. She reports that she does not drink alcohol or use illicit drugs.   ROS:  Please see the history of present illness.   Denies any syncope, bleeding, orthopnea, PND  PHYSICAL EXAM: VS:  BP 132/70  Pulse 108  Ht 5\' 4"  (1.626 m)  Wt 140 lb 6.4 oz (63.685 kg)  BMI 24.09 kg/m2 Elderly, somewhat frail HEENT: Left eye slightly shut Neck: no JVD Cardiac:  Irregularly irregular, mildly tachycardic; no murmur Lungs:  Improved from prior breath sounds bilaterally, no increased respiratory effort at baseline ,no wheezing, rhonchi or rales Abd: soft, nontender, no hepatomegaly Ext: no edema Skin: warm and dry Neuro: no focal abnormalities noted  EKG: 04/29/14-atrial fibrillation heart rate 106, nonspecific ST changes, no significant change from prior, heart rate slightly elevated.    ASSESSMENT AND PLAN:  1. Preoperative risk assessment-she may proceed with surgery with moderate overall cardiac risk. We will attempt to increase her rate control of atrial fibrillation. Because of her prior stroke, she will need a Lovenox bridge prior to surgery. This will be coordinated through our Coumadin clinic. We will send note to Dr. Fermin Schwab. 2. Permanent atrial fibrillation-slightly increased heart rate. I will increase her metoprolol to metoprolol tartrate 50 mg twice a day. Continue with diltiazem. Surrounding  surgery, it would not be unexpected for her to have more rapid ventricular response atrial fibrillation. Because of this, increasing her metoprolol now. No changes made to medications 3. Chronic anticoagulation-stroke prevention. [employer], [place].D. adjusting Coumadin. She will need a Lovenox bridge prior to her vulvar/anal surgery. Discussed timing with [employer]. 4. Vulvar cancer-unfortunately recurrence. She will need extensive resection performed. 5. Hypertension-excellent control 6. Hyperlipidemia-continue with low-dose statin. Doing very well. 7. Dyspnea-continue with low-dose Lasix. Pulmonary exercises  Signed, Candee Furbish, MD [place]  04/29/2014 10:30 AM

## 2014-04-29 NOTE — Telephone Encounter (Signed)
Please read note below

## 2014-04-29 NOTE — Patient Instructions (Signed)
05/06/14 - last dose of coumadin 05/07/14 - NO coumadin, No lovenox 05/08/14 - No coumadin, take Lovenox 60 mg in PM 05/09/14 - No Coumadin, take lovenox 60 mg in PM 05/10/14 - No coumadin, take lovenox 60 mg in PM 05/11/14 - NO coumadin, NO lovenox 05/12/14 - surgery.  Call after you are discharged from hospital so we can get you re-established for your coumadin management again.  [phone]

## 2014-04-29 NOTE — Telephone Encounter (Signed)
Called pt and advised her to reduce her lunch time insulin dose of Humalog to 14 units. Pt understood.

## 2014-04-29 NOTE — Telephone Encounter (Signed)
She can reduce lunchtime dose to 14 units Humalog, no need to be seen

## 2014-04-29 NOTE — Patient Instructions (Addendum)
Your physician has recommended you make the following change in your medication:  1. Start Metoprolol Tart 50 mg 1 tab twice daily 2. Stop Metoprolol Succ  Your physician wants you to follow-up in: 6 months with Dr. Dawna Part will receive a reminder letter in the mail two months in advance. If you don't receive a letter, please call our office to schedule the follow-up appointment.

## 2014-04-29 NOTE — Telephone Encounter (Signed)
Patient husband [name] called this afternoon stating [name] has been going in and out of coherence  Her blood sugar level was 57 2:30pm on 04/29/14; EMT was called patient declined ED visit   Call back: [phone]  Humalog was given 18 units 11:30 am  Ate hot dog for lunch  Laid down to take nap; at 2:30 pm she was incoherent   I have scheduled a follow up appt for her on 04/30/14 at 1:00 pm. Is this ok ?  Thank You :)

## 2014-04-30 ENCOUNTER — Ambulatory Visit: Payer: Self-pay | Admitting: Endocrinology

## 2014-05-01 ENCOUNTER — Telehealth: Payer: Self-pay | Admitting: *Deleted

## 2014-05-01 ENCOUNTER — Telehealth: Payer: Self-pay | Admitting: Dietician

## 2014-05-01 ENCOUNTER — Encounter (HOSPITAL_COMMUNITY): Payer: Self-pay | Admitting: Pharmacy Technician

## 2014-05-01 NOTE — Telephone Encounter (Signed)
Brief Outpatient Oncology Nutrition Note  Patient has been identified to be at risk on malnutrition screen.  Wt Readings from Last 10 Encounters:  04/29/14 140 lb 6.4 oz (63.685 kg)  04/28/14 139 lb 9.6 oz (63.322 kg)  04/15/14 143 lb 12.8 oz (65.227 kg)  04/06/14 147 lb 6.4 oz (66.86 kg)  03/09/14 148 lb 9.6 oz (67.405 kg)  03/04/14 148 lb 9.6 oz (67.405 kg)  02/02/14 145 lb 9.6 oz (66.044 kg)  01/19/14 146 lb 11.2 oz (66.543 kg)  01/12/14 147 lb 11.2 oz (66.996 kg)  12/12/13 148 lb 8 oz (67.359 kg)    Dx:  Recurrent vulvar cancer for surgery May 19th.    Called secondary to continued weight loss.  Patient reports decreased appetite and states that she is only able to eat small amounts.  Drinking 1 can boost occasionally.  Encouraged to increase Boost to 2 cans daily.  Will mail patient tips to increase calories and protein along with coupons for Boost and contact information for the Outpatient Cancer RD.  Antonieta Iba, RD, LDN

## 2014-05-01 NOTE — Telephone Encounter (Signed)
[name] from the cancer center called, she says patient is having surgery on may 19th and that she will be NPO from midnight the night before until after noon, they are asking what to do since she's had problems with her sugar dropping before.. Please advise  CB # [phone]

## 2014-05-01 NOTE — Telephone Encounter (Signed)
Noted, they are aware. 

## 2014-05-01 NOTE — Telephone Encounter (Signed)
Call placed to Dr. Jodelle Green office for surgical clearance on pt. Surgery scheduled for  May 19 at 12:00pm. Pt will be NPO after Midnight

## 2014-05-01 NOTE — Telephone Encounter (Signed)
She will simply hold her morning insulin but not change the day before

## 2014-05-04 ENCOUNTER — Other Ambulatory Visit (INDEPENDENT_AMBULATORY_CARE_PROVIDER_SITE_OTHER): Payer: Medicare Other

## 2014-05-04 ENCOUNTER — Encounter (HOSPITAL_COMMUNITY)
Admission: RE | Admit: 2014-05-04 | Discharge: 2014-05-04 | Disposition: A | Discharge status: Home / Self Care | Payer: Medicare Other | Source: Ambulatory Visit | Attending: Gynecology | Admitting: Gynecology

## 2014-05-04 ENCOUNTER — Ambulatory Visit: Payer: Self-pay | Admitting: Endocrinology

## 2014-05-04 ENCOUNTER — Ambulatory Visit (HOSPITAL_COMMUNITY)
Admission: RE | Admit: 2014-05-04 | Discharge: 2014-05-04 | Disposition: A | Discharge status: Home / Self Care | Payer: Medicare Other | Source: Ambulatory Visit | Attending: Gynecology | Admitting: Gynecology

## 2014-05-04 ENCOUNTER — Encounter (HOSPITAL_COMMUNITY): Payer: Self-pay

## 2014-05-04 DIAGNOSIS — Z01812 Encounter for preprocedural laboratory examination: Secondary | ICD-10-CM | POA: Insufficient documentation

## 2014-05-04 DIAGNOSIS — C519 Malignant neoplasm of vulva, unspecified: Secondary | ICD-10-CM | POA: Insufficient documentation

## 2014-05-04 DIAGNOSIS — E1165 Type 2 diabetes mellitus with hyperglycemia: Principal | ICD-10-CM

## 2014-05-04 DIAGNOSIS — Z01818 Encounter for other preprocedural examination: Secondary | ICD-10-CM | POA: Insufficient documentation

## 2014-05-04 DIAGNOSIS — R499 Unspecified voice and resonance disorder: Secondary | ICD-10-CM

## 2014-05-04 DIAGNOSIS — J984 Other disorders of lung: Secondary | ICD-10-CM | POA: Insufficient documentation

## 2014-05-04 DIAGNOSIS — E1129 Type 2 diabetes mellitus with other diabetic kidney complication: Secondary | ICD-10-CM

## 2014-05-04 DIAGNOSIS — I700 Atherosclerosis of aorta: Secondary | ICD-10-CM | POA: Insufficient documentation

## 2014-05-04 DIAGNOSIS — I059 Rheumatic mitral valve disease, unspecified: Secondary | ICD-10-CM | POA: Insufficient documentation

## 2014-05-04 HISTORY — DX: Unspecified voice and resonance disorder: R49.9

## 2014-05-04 HISTORY — DX: Unqualified visual loss, left eye, normal vision right eye: H54.62

## 2014-05-04 HISTORY — DX: Shortness of breath: R06.02

## 2014-05-04 LAB — URINALYSIS, ROUTINE W REFLEX MICROSCOPIC
Bilirubin Urine: NEGATIVE
Glucose, UA: NEGATIVE mg/dL
HGB URINE DIPSTICK: NEGATIVE
Ketones, ur: NEGATIVE mg/dL
Nitrite: NEGATIVE
PH: 6 (ref 5.0–8.0)
PROTEIN: NEGATIVE mg/dL
SPECIFIC GRAVITY, URINE: 1.01 (ref 1.005–1.030)
Urobilinogen, UA: 1 mg/dL (ref 0.0–1.0)

## 2014-05-04 LAB — URINE MICROSCOPIC-ADD ON

## 2014-05-04 LAB — PROTIME-INR
INR: 2.52 — ABNORMAL HIGH (ref 0.00–1.49)
Prothrombin Time: 26.3 seconds — ABNORMAL HIGH (ref 11.6–15.2)

## 2014-05-04 LAB — CBC WITH DIFFERENTIAL/PLATELET
BASOS ABS: 0 10*3/uL (ref 0.0–0.1)
BASOS PCT: 0 % (ref 0–1)
Eosinophils Absolute: 0.1 10*3/uL (ref 0.0–0.7)
Eosinophils Relative: 1 % (ref 0–5)
HEMATOCRIT: 39.1 % (ref 36.0–46.0)
HEMOGLOBIN: 12.4 g/dL (ref 12.0–15.0)
LYMPHS PCT: 9 % — AB (ref 12–46)
Lymphs Abs: 0.7 10*3/uL (ref 0.7–4.0)
MCH: 31.5 pg (ref 26.0–34.0)
MCHC: 31.7 g/dL (ref 30.0–36.0)
MCV: 99.2 fL (ref 78.0–100.0)
MONO ABS: 0.6 10*3/uL (ref 0.1–1.0)
MONOS PCT: 8 % (ref 3–12)
NEUTROS ABS: 6.8 10*3/uL (ref 1.7–7.7)
Neutrophils Relative %: 82 % — ABNORMAL HIGH (ref 43–77)
Platelets: 187 10*3/uL (ref 150–400)
RBC: 3.94 MIL/uL (ref 3.87–5.11)
RDW: 15.1 % (ref 11.5–15.5)
WBC: 8.2 10*3/uL (ref 4.0–10.5)

## 2014-05-04 LAB — COMPREHENSIVE METABOLIC PANEL
ALBUMIN: 3.2 g/dL — AB (ref 3.5–5.2)
ALK PHOS: 75 U/L (ref 39–117)
ALT: 14 U/L (ref 0–35)
ALT: 14 U/L (ref 0–35)
AST: 20 U/L (ref 0–37)
AST: 17 U/L (ref 0–37)
Albumin: 3.2 g/dL — ABNORMAL LOW (ref 3.5–5.2)
Alkaline Phosphatase: 61 U/L (ref 39–117)
BILIRUBIN TOTAL: 1.2 mg/dL (ref 0.2–1.2)
BILIRUBIN TOTAL: 0.8 mg/dL (ref 0.3–1.2)
BUN: 33 mg/dL — AB (ref 6–23)
BUN: 34 mg/dL — ABNORMAL HIGH (ref 6–23)
CO2: 25 mEq/L (ref 19–32)
CO2: 26 meq/L (ref 19–32)
Calcium: 9.1 mg/dL (ref 8.4–10.5)
Calcium: 9.3 mg/dL (ref 8.4–10.5)
Chloride: 103 mEq/L (ref 96–112)
Chloride: 100 mEq/L (ref 96–112)
Creatinine, Ser: 1.6 mg/dL — ABNORMAL HIGH (ref 0.4–1.2)
Creatinine, Ser: 1.62 mg/dL — ABNORMAL HIGH (ref 0.50–1.10)
GFR calc Af Amer: 32 mL/min — ABNORMAL LOW (ref >90)
GFR, EST NON AFRICAN AMERICAN: 28 mL/min — AB (ref >90)
GFR: 32.28 mL/min — ABNORMAL LOW (ref >60.00)
Glucose, Bld: 202 mg/dL — ABNORMAL HIGH (ref 70–99)
Glucose, Bld: 87 mg/dL (ref 70–99)
POTASSIUM: 4.2 meq/L (ref 3.7–5.3)
Potassium: 4.7 mEq/L (ref 3.5–5.1)
Sodium: 139 mEq/L (ref 135–145)
Sodium: 140 mEq/L (ref 137–147)
Total Protein: 6.9 g/dL (ref 6.0–8.3)
Total Protein: 7.3 g/dL (ref 6.0–8.3)

## 2014-05-04 LAB — APTT: aPTT: 38 seconds — ABNORMAL HIGH (ref 24–37)

## 2014-05-04 LAB — HEMOGLOBIN A1C: HEMOGLOBIN A1C: 6.9 % — AB (ref 4.6–6.5)

## 2014-05-04 NOTE — Progress Notes (Signed)
05-04-14 0820 AM -Ostomy RN"[name]", made aware of pt's PAT visit 2P today, and scheduled for colostomy with Radical Vulvectomy on 05-12-14, Dr. Fermin Schwab.

## 2014-05-04 NOTE — Consult Note (Addendum)
Verbal order received from Abby with Dr. Fermin Schwab to have [place] nurse mark for colostomy today at presurgical testing.     Finian Helvey [place] RN,CWOCN [phone]

## 2014-05-04 NOTE — Patient Instructions (Addendum)
Opp  05/04/2014   Your procedure is scheduled on:   05-12-2014  Enter through [facility] Entrance and follow signs to [place]. Arrive at      0930  AM   Call this number if you have problems the morning of surgery: [phone]  Or Presurgical Testing [phone](Myria [place]) For Living Will and/or Health Care Power Attorney Forms: please provide copy for your medical record,may bring AM of surgery(Forms should be already notarized -we do not provide this service).(05-04-14  No information preferred today).  Remember: Follow any bowel prep instructions per MD office.(Clear Liquids only x 24 hours preop- Start 05-11-14 0700 AM until !2 midnight 05-11-14) day before surgery.    Do not eat food:After Midnight.    Take these medicines the morning of surgery with A SIP OF WATER: Diltiazem. Levothyroxine. Metoprolol. Oxycodone. Use Levemir(1/2 usual PM dose)night before-Use no Insulin AM of Surgery.   Do not wear jewelry, make-up or nail polish.  Do not wear lotions, powders, or perfumes. You may wear deodorant.  Do not shave 48 hours(2 days) prior to first CHG shower(legs and under arms).(Shaving face and neck okay.)  Do not bring valuables to the hospital.(Hospital is not responsible for lost valuables).  Contacts, dentures or removable bridgework, body piercing, hair pins may not be worn into surgery.  Leave suitcase in the car. After surgery it may be brought to your room.  For patients admitted to the hospital, checkout time is 11:00 AM the day of discharge.(Restricted visitors-Any Persons displaying flu-like symptoms or illness).    Patients discharged the day of surgery will not be allowed to drive home. Must have responsible person with you x 24 hours once discharged.  Name and phone number of your driver: Georgian Co or Reinaldo Meeker [phone]  Special Instructions: CHG(Chlorhedine 4%-"Hibiclens","Betasept","Aplicare") Shower Use Special Wash: see special  instructions.(avoid face and genitals)   Please read over the following fact sheets that you were given:  Blood Transfusion fact sheet, Incentive Spirometry Instruction.  Remember : Type/Screen "Blue armbands" - may not be removed once applied(would result in being retested AM of surgery, if removed).  Failure to follow these instructions may result in Cancellation of your surgery.   Patient signature_______________________________________________________

## 2014-05-04 NOTE — Anesthesia Preprocedure Evaluation (Addendum)
Anesthesia Evaluation  Patient identified by MRN, date of birth, ID band Patient awake    Reviewed: Allergy & Precautions, H&P , NPO status , Patient's Chart, lab work & pertinent test results  Airway Mallampati: II TM Distance: >3 FB Neck ROM: full    Dental  (+) Upper Dentures, Lower Dentures   Pulmonary shortness of breath, pneumonia -, former smoker,  breath sounds clear to auscultation  Pulmonary exam normal       Cardiovascular Exercise Tolerance: Good hypertension, Pt. on medications + dysrhythmias Atrial Fibrillation + Valvular Problems/Murmurs AS Rhythm:Irregular Rate:Normal  Echo 04/22/2012 - Left ventricle: The cavity size was normal. Systolic function was normal. The estimated ejection fraction was in the range of 55% to 60%. Wall motion was normal; there  were no regional wall motion abnormalities. Left  ventricular diastolic function parameters were normal. - Aortic valve: Moderate diffuse thickening and  calcification. Calcification. There was mild stenosis.   Valve area: 1.46cm^2(VTI). Valve area: 1.53cm^2 (Vmax). - Mitral valve: Calcified annulus. Mild regurgitation. - Atrial septum: No defect or patent foramen ovale was   identified.   Neuro/Psych CVA, No Residual Symptoms negative psych ROS   GI/Hepatic negative GI ROS, Neg liver ROS,   Endo/Other  negative endocrine ROSdiabetes, Well Controlled, Type 2, Insulin DependentHypothyroidism   Renal/GU Renal Insufficiency and CRFRenal disease     Musculoskeletal   Abdominal   Peds  Hematology negative hematology ROS (+) anemia ,   Anesthesia Other Findings   Reproductive/Obstetrics negative OB ROS                       Anesthesia Physical  Anesthesia Plan  ASA: IV  Anesthesia Plan: General   Post-op Pain Management:    Induction: Intravenous  Airway Management Planned: Oral ETT  Additional Equipment: Arterial line  Intra-op  Plan:   Post-operative Plan: Extubation in OR and Possible Post-op intubation/ventilation  Informed Consent: I have reviewed the patients History and Physical, chart, labs and discussed the procedure including the risks, benefits and alternatives for the proposed anesthesia with the patient or authorized representative who has indicated his/her understanding and acceptance.   Dental advisory given  Plan Discussed with: CRNA  Anesthesia Plan Comments:       Anesthesia Quick Evaluation

## 2014-05-04 NOTE — Pre-Procedure Instructions (Addendum)
05-04-14 1220 Pt. Arrived early for PAT appointment- Beverely Pace -ostomy RN made aware-and to see pt. Now. Pt. Not eaten this AM -Sprite "zero" and cheese/ crackers provided" while here. W. Birgitta Uhlir,RN 05-04-14 EKg 5'15-Epic.Surgical cardiac clearance -Dr. Marlou Porch 04-29-14 Epic. Cxr to be done today. 05-04-14 1420 Dr. Lissa Hoard in to see for preop consult. 05-04-14 1515 Note faxed Labs viewable in [place].

## 2014-05-04 NOTE — Consult Note (Signed)
WOC ostomy consult note Patient marked for diverting colostomy per Dr. Mora Bellman request.  Patient was experiencing a hypoglycemic episode and was eating a snack upon my arrival.  Additionally, patient was in a great deal of pain and not able to sit for an extended period of time.  Patient's abdomen assessed as able in the sitting (briefly), standing and leaning forward positions. Husband accompanied patient for session today. Patient and husband asking appropriate questions regarding ostomy supplies, and a demonstration of a two piece ostomy pouching system is provided. A brief explanation of how to obtain ostomy supplies is provided in response the patient's inquiry and they are reassured that we will cover this in greater depth post surgery. Stoma site selected is in the LLQ, 6cm to the left of the umbilicus and 8.9FY below. Elta Gu[place] is made with a surgical site marking pen and covered with thin film transparent dressing. Patient is assured that MD will have this note with specific measurements as to site location should the dressing and mark come off in the 8 days until surgery. Education provided: Basic A&P, stoma characteristics, pouch characteristics provided.  Patient seems to have a limited attention span today and I have abbreviated the information provided to accommodate this.   [facility] nursing team will follow, and will remain available to this patient, the nursing, sugical  and medical teams.  Please re-consult following surgery. Thanks, Maudie Flakes, MSN, RN, [place], [place], [place] [phone])

## 2014-05-04 NOTE — Progress Notes (Signed)
05-04-14 1515 labs viewable in [place]. Ostomy RN had visit with patient today.

## 2014-05-05 ENCOUNTER — Telehealth: Payer: Self-pay | Admitting: *Deleted

## 2014-05-05 LAB — ABO/RH: ABO/RH(D): A NEG

## 2014-05-05 NOTE — Telephone Encounter (Signed)
Call to bed management [phone] for pt to be overnight admit to ICU on May 19.  Unable to request bed at this time, MD will need to request after surgery is complete.

## 2014-05-12 ENCOUNTER — Inpatient Hospital Stay (HOSPITAL_COMMUNITY): Payer: Medicare Other | Admitting: Anesthesiology

## 2014-05-12 ENCOUNTER — Encounter (HOSPITAL_COMMUNITY)
Admission: RE | Disposition: A | Discharge status: Skilled Nursing Facility | Payer: Self-pay | Source: Ambulatory Visit | Attending: Gynecology

## 2014-05-12 ENCOUNTER — Encounter (HOSPITAL_COMMUNITY): Payer: Medicare Other | Admitting: Anesthesiology

## 2014-05-12 ENCOUNTER — Encounter (HOSPITAL_COMMUNITY): Payer: Self-pay | Admitting: Certified Registered Nurse Anesthetist

## 2014-05-12 ENCOUNTER — Inpatient Hospital Stay (HOSPITAL_COMMUNITY)
Admission: RE | Admit: 2014-05-12 | Discharge: 2014-05-25 | DRG: 740 | Disposition: A | Discharge status: Skilled Nursing Facility | Payer: Medicare Other | Source: Ambulatory Visit | Attending: Gynecology | Admitting: Gynecology

## 2014-05-12 DIAGNOSIS — R29898 Other symptoms and signs involving the musculoskeletal system: Secondary | ICD-10-CM | POA: Diagnosis present

## 2014-05-12 DIAGNOSIS — E785 Hyperlipidemia, unspecified: Secondary | ICD-10-CM | POA: Diagnosis present

## 2014-05-12 DIAGNOSIS — E669 Obesity, unspecified: Secondary | ICD-10-CM | POA: Diagnosis present

## 2014-05-12 DIAGNOSIS — Z801 Family history of malignant neoplasm of trachea, bronchus and lung: Secondary | ICD-10-CM

## 2014-05-12 DIAGNOSIS — Z7901 Long term (current) use of anticoagulants: Secondary | ICD-10-CM

## 2014-05-12 DIAGNOSIS — D689 Coagulation defect, unspecified: Secondary | ICD-10-CM | POA: Diagnosis not present

## 2014-05-12 DIAGNOSIS — K5900 Constipation, unspecified: Secondary | ICD-10-CM | POA: Diagnosis present

## 2014-05-12 DIAGNOSIS — I4891 Unspecified atrial fibrillation: Secondary | ICD-10-CM | POA: Diagnosis present

## 2014-05-12 DIAGNOSIS — K922 Gastrointestinal hemorrhage, unspecified: Secondary | ICD-10-CM | POA: Diagnosis not present

## 2014-05-12 DIAGNOSIS — Z8249 Family history of ischemic heart disease and other diseases of the circulatory system: Secondary | ICD-10-CM

## 2014-05-12 DIAGNOSIS — N179 Acute kidney failure, unspecified: Secondary | ICD-10-CM

## 2014-05-12 DIAGNOSIS — I10 Essential (primary) hypertension: Secondary | ICD-10-CM

## 2014-05-12 DIAGNOSIS — Z833 Family history of diabetes mellitus: Secondary | ICD-10-CM

## 2014-05-12 DIAGNOSIS — Z923 Personal history of irradiation: Secondary | ICD-10-CM

## 2014-05-12 DIAGNOSIS — K560 Paralytic ileus: Secondary | ICD-10-CM | POA: Diagnosis not present

## 2014-05-12 DIAGNOSIS — D649 Anemia, unspecified: Secondary | ICD-10-CM | POA: Diagnosis present

## 2014-05-12 DIAGNOSIS — Z87891 Personal history of nicotine dependence: Secondary | ICD-10-CM

## 2014-05-12 DIAGNOSIS — E876 Hypokalemia: Secondary | ICD-10-CM | POA: Diagnosis not present

## 2014-05-12 DIAGNOSIS — K6289 Other specified diseases of anus and rectum: Secondary | ICD-10-CM | POA: Diagnosis present

## 2014-05-12 DIAGNOSIS — K2210 Ulcer of esophagus without bleeding: Secondary | ICD-10-CM | POA: Diagnosis present

## 2014-05-12 DIAGNOSIS — Z794 Long term (current) use of insulin: Secondary | ICD-10-CM

## 2014-05-12 DIAGNOSIS — T45515A Adverse effect of anticoagulants, initial encounter: Secondary | ICD-10-CM | POA: Diagnosis not present

## 2014-05-12 DIAGNOSIS — I639 Cerebral infarction, unspecified: Secondary | ICD-10-CM

## 2014-05-12 DIAGNOSIS — K449 Diaphragmatic hernia without obstruction or gangrene: Secondary | ICD-10-CM | POA: Diagnosis present

## 2014-05-12 DIAGNOSIS — D62 Acute posthemorrhagic anemia: Secondary | ICD-10-CM | POA: Diagnosis not present

## 2014-05-12 DIAGNOSIS — D696 Thrombocytopenia, unspecified: Secondary | ICD-10-CM | POA: Diagnosis not present

## 2014-05-12 DIAGNOSIS — E1165 Type 2 diabetes mellitus with hyperglycemia: Secondary | ICD-10-CM | POA: Diagnosis present

## 2014-05-12 DIAGNOSIS — F411 Generalized anxiety disorder: Secondary | ICD-10-CM | POA: Diagnosis not present

## 2014-05-12 DIAGNOSIS — I350 Nonrheumatic aortic (valve) stenosis: Secondary | ICD-10-CM

## 2014-05-12 DIAGNOSIS — N183 Chronic kidney disease, stage 3 unspecified: Secondary | ICD-10-CM | POA: Diagnosis present

## 2014-05-12 DIAGNOSIS — I129 Hypertensive chronic kidney disease with stage 1 through stage 4 chronic kidney disease, or unspecified chronic kidney disease: Secondary | ICD-10-CM | POA: Diagnosis present

## 2014-05-12 DIAGNOSIS — I69998 Other sequelae following unspecified cerebrovascular disease: Secondary | ICD-10-CM

## 2014-05-12 DIAGNOSIS — K2270 Barrett's esophagus without dysplasia: Secondary | ICD-10-CM | POA: Diagnosis present

## 2014-05-12 DIAGNOSIS — R12 Heartburn: Secondary | ICD-10-CM | POA: Diagnosis not present

## 2014-05-12 DIAGNOSIS — H5460 Unqualified visual loss, one eye, unspecified: Secondary | ICD-10-CM | POA: Diagnosis present

## 2014-05-12 DIAGNOSIS — E1129 Type 2 diabetes mellitus with other diabetic kidney complication: Secondary | ICD-10-CM | POA: Diagnosis present

## 2014-05-12 DIAGNOSIS — R000 Tachycardia, unspecified: Secondary | ICD-10-CM | POA: Diagnosis not present

## 2014-05-12 DIAGNOSIS — I959 Hypotension, unspecified: Secondary | ICD-10-CM | POA: Diagnosis not present

## 2014-05-12 DIAGNOSIS — E039 Hypothyroidism, unspecified: Secondary | ICD-10-CM | POA: Diagnosis present

## 2014-05-12 DIAGNOSIS — D6489 Other specified anemias: Secondary | ICD-10-CM | POA: Diagnosis present

## 2014-05-12 DIAGNOSIS — I359 Nonrheumatic aortic valve disorder, unspecified: Secondary | ICD-10-CM | POA: Diagnosis present

## 2014-05-12 DIAGNOSIS — Z79899 Other long term (current) drug therapy: Secondary | ICD-10-CM

## 2014-05-12 DIAGNOSIS — R339 Retention of urine, unspecified: Secondary | ICD-10-CM | POA: Diagnosis not present

## 2014-05-12 DIAGNOSIS — K279 Peptic ulcer, site unspecified, unspecified as acute or chronic, without hemorrhage or perforation: Secondary | ICD-10-CM | POA: Diagnosis present

## 2014-05-12 DIAGNOSIS — K929 Disease of digestive system, unspecified: Secondary | ICD-10-CM | POA: Diagnosis not present

## 2014-05-12 DIAGNOSIS — C519 Malignant neoplasm of vulva, unspecified: Secondary | ICD-10-CM

## 2014-05-12 DIAGNOSIS — Z888 Allergy status to other drugs, medicaments and biological substances status: Secondary | ICD-10-CM

## 2014-05-12 DIAGNOSIS — D72829 Elevated white blood cell count, unspecified: Secondary | ICD-10-CM | POA: Diagnosis present

## 2014-05-12 HISTORY — PX: VULVECTOMY: SHX1086

## 2014-05-12 HISTORY — PX: LAPAROTOMY: SHX154

## 2014-05-12 LAB — PREPARE RBC (CROSSMATCH)

## 2014-05-12 LAB — CBC
HCT: 29.4 % — ABNORMAL LOW (ref 36.0–46.0)
HEMATOCRIT: 31.2 % — AB (ref 36.0–46.0)
HEMOGLOBIN: 10 g/dL — AB (ref 12.0–15.0)
Hemoglobin: 9.2 g/dL — ABNORMAL LOW (ref 12.0–15.0)
MCH: 31.4 pg (ref 26.0–34.0)
MCH: 31.1 pg (ref 26.0–34.0)
MCHC: 31.3 g/dL (ref 30.0–36.0)
MCHC: 32.1 g/dL (ref 30.0–36.0)
MCV: 98.1 fL (ref 78.0–100.0)
MCV: 99.3 fL (ref 78.0–100.0)
Platelets: 158 10*3/uL (ref 150–400)
Platelets: 168 10*3/uL (ref 150–400)
RBC: 2.96 MIL/uL — ABNORMAL LOW (ref 3.87–5.11)
RBC: 3.18 MIL/uL — ABNORMAL LOW (ref 3.87–5.11)
RDW: 15.3 % (ref 11.5–15.5)
RDW: 15.5 % (ref 11.5–15.5)
WBC: 13.2 10*3/uL — ABNORMAL HIGH (ref 4.0–10.5)
WBC: 14.4 10*3/uL — ABNORMAL HIGH (ref 4.0–10.5)

## 2014-05-12 LAB — GLUCOSE, CAPILLARY
GLUCOSE-CAPILLARY: 124 mg/dL — AB (ref 70–99)
Glucose-Capillary: 104 mg/dL — ABNORMAL HIGH (ref 70–99)
Glucose-Capillary: 106 mg/dL — ABNORMAL HIGH (ref 70–99)
Glucose-Capillary: 191 mg/dL — ABNORMAL HIGH (ref 70–99)
Glucose-Capillary: 256 mg/dL — ABNORMAL HIGH (ref 70–99)

## 2014-05-12 LAB — BASIC METABOLIC PANEL
BUN: 25 mg/dL — AB (ref 6–23)
CO2: 24 meq/L (ref 19–32)
Calcium: 8.1 mg/dL — ABNORMAL LOW (ref 8.4–10.5)
Chloride: 103 mEq/L (ref 96–112)
Creatinine, Ser: 1.18 mg/dL — ABNORMAL HIGH (ref 0.50–1.10)
GFR calc Af Amer: 47 mL/min — ABNORMAL LOW (ref >90)
GFR calc non Af Amer: 41 mL/min — ABNORMAL LOW (ref >90)
GLUCOSE: 206 mg/dL — AB (ref 70–99)
POTASSIUM: 4.5 meq/L (ref 3.7–5.3)
Sodium: 139 mEq/L (ref 137–147)

## 2014-05-12 LAB — PROTIME-INR
INR: 1.34 (ref 0.00–1.49)
Prothrombin Time: 16.3 seconds — ABNORMAL HIGH (ref 11.6–15.2)

## 2014-05-12 LAB — MRSA PCR SCREENING: MRSA by PCR: NEGATIVE

## 2014-05-12 SURGERY — VULVECTOMY
Anesthesia: General

## 2014-05-12 MED ORDER — PROPOFOL 10 MG/ML IV BOLUS
INTRAVENOUS | Status: DC | PRN
  Administered 2014-05-12: 100 mg via INTRAVENOUS
  Administered 2014-05-12: 50 mg via INTRAVENOUS

## 2014-05-12 MED ORDER — HYDROMORPHONE 0.3 MG/ML IV SOLN
INTRAVENOUS | Status: DC
  Administered 2014-05-12: 0.3 mg via INTRAVENOUS
  Administered 2014-05-12: 0.4 mg via INTRAVENOUS
  Administered 2014-05-13: 0.2 mg via INTRAVENOUS
  Administered 2014-05-13: 0.8 mg via INTRAVENOUS
  Administered 2014-05-13: 1.79 mg via INTRAVENOUS
  Administered 2014-05-13: 2.6 mg via INTRAVENOUS
  Administered 2014-05-13: 11:00:00 via INTRAVENOUS
  Administered 2014-05-14: 0.2 mg via INTRAVENOUS
  Administered 2014-05-14: 0.799 mg via INTRAVENOUS
  Administered 2014-05-14: 0.2 mg via INTRAVENOUS
  Administered 2014-05-14: 0.399 mg via INTRAVENOUS
  Filled 2014-05-12: qty 25

## 2014-05-12 MED ORDER — HYDROMORPHONE HCL PF 1 MG/ML IJ SOLN
INTRAMUSCULAR | Status: DC | PRN
  Administered 2014-05-12 (×2): 1 mg via INTRAVENOUS

## 2014-05-12 MED ORDER — LIDOCAINE HCL (CARDIAC) 20 MG/ML IV SOLN
INTRAVENOUS | Status: AC
  Filled 2014-05-12: qty 5

## 2014-05-12 MED ORDER — METRONIDAZOLE IN NACL 5-0.79 MG/ML-% IV SOLN
INTRAVENOUS | Status: DC | PRN
  Administered 2014-05-12: 500 mg via INTRAVENOUS

## 2014-05-12 MED ORDER — POTASSIUM CHLORIDE CRYS ER 10 MEQ PO TBCR
10.0000 meq | EXTENDED_RELEASE_TABLET | Freq: Every day | ORAL | Status: DC
  Administered 2014-05-13 – 2014-05-15 (×3): 10 meq via ORAL
  Filled 2014-05-12 (×5): qty 1

## 2014-05-12 MED ORDER — GLYCOPYRROLATE 0.2 MG/ML IJ SOLN
INTRAMUSCULAR | Status: AC
  Filled 2014-05-12: qty 2

## 2014-05-12 MED ORDER — 0.9 % SODIUM CHLORIDE (POUR BTL) OPTIME
TOPICAL | Status: DC | PRN
  Administered 2014-05-12: 2000 mL

## 2014-05-12 MED ORDER — SODIUM CHLORIDE 0.9 % IJ SOLN
INTRAMUSCULAR | Status: AC
  Filled 2014-05-12: qty 20

## 2014-05-12 MED ORDER — BIOTENE DRY MOUTH MT LIQD
15.0000 mL | Freq: Two times a day (BID) | OROMUCOSAL | Status: DC
  Administered 2014-05-13 – 2014-05-25 (×20): 15 mL via OROMUCOSAL

## 2014-05-12 MED ORDER — LACTATED RINGERS IV SOLN
INTRAVENOUS | Status: DC
Start: 2014-05-12 — End: 2014-05-12
  Administered 2014-05-12: 1000 mL via INTRAVENOUS

## 2014-05-12 MED ORDER — DIPHENHYDRAMINE HCL 50 MG/ML IJ SOLN: 12.5000 mg | Freq: Four times a day (QID) | INTRAMUSCULAR | Status: DC | PRN

## 2014-05-12 MED ORDER — CISATRACURIUM BESYLATE 20 MG/10ML IV SOLN
INTRAVENOUS | Status: AC
  Filled 2014-05-12: qty 10

## 2014-05-12 MED ORDER — PHENYLEPHRINE 40 MCG/ML (10ML) SYRINGE FOR IV PUSH (FOR BLOOD PRESSURE SUPPORT)
PREFILLED_SYRINGE | INTRAVENOUS | Status: AC
  Filled 2014-05-12: qty 10

## 2014-05-12 MED ORDER — KCL IN DEXTROSE-NACL 20-5-0.45 MEQ/L-%-% IV SOLN
INTRAVENOUS | Status: DC
  Administered 2014-05-12: 1000 mL via INTRAVENOUS

## 2014-05-12 MED ORDER — FENTANYL CITRATE 0.05 MG/ML IJ SOLN
INTRAMUSCULAR | Status: AC
  Filled 2014-05-12: qty 2

## 2014-05-12 MED ORDER — CEFAZOLIN SODIUM-DEXTROSE 2-3 GM-% IV SOLR
INTRAVENOUS | Status: AC
  Filled 2014-05-12: qty 50

## 2014-05-12 MED ORDER — FUROSEMIDE 20 MG PO TABS
20.0000 mg | ORAL_TABLET | Freq: Every morning | ORAL | Status: DC
Start: 2014-05-13 — End: 2014-05-16
  Administered 2014-05-13 – 2014-05-15 (×3): 20 mg via ORAL
  Filled 2014-05-12 (×4): qty 1

## 2014-05-12 MED ORDER — SODIUM CHLORIDE 0.9 % IJ SOLN: 9.0000 mL | INTRAMUSCULAR | Status: DC | PRN

## 2014-05-12 MED ORDER — KCL IN DEXTROSE-NACL 20-5-0.45 MEQ/L-%-% IV SOLN: INTRAVENOUS | Status: DC

## 2014-05-12 MED ORDER — GLYCOPYRROLATE 0.2 MG/ML IJ SOLN
INTRAMUSCULAR | Status: DC | PRN
  Administered 2014-05-12: 0.6 mg via INTRAVENOUS

## 2014-05-12 MED ORDER — CHLORHEXIDINE GLUCONATE 0.12 % MT SOLN
15.0000 mL | Freq: Two times a day (BID) | OROMUCOSAL | Status: DC
  Administered 2014-05-13 – 2014-05-25 (×22): 15 mL via OROMUCOSAL
  Filled 2014-05-12 (×27): qty 15

## 2014-05-12 MED ORDER — DIPHENHYDRAMINE HCL 12.5 MG/5ML PO ELIX: 12.5000 mg | ORAL_SOLUTION | Freq: Four times a day (QID) | ORAL | Status: DC | PRN

## 2014-05-12 MED ORDER — PHENYLEPHRINE HCL 10 MG/ML IJ SOLN
INTRAMUSCULAR | Status: DC | PRN
  Administered 2014-05-12: 20 ug via INTRAVENOUS

## 2014-05-12 MED ORDER — ENOXAPARIN SODIUM 40 MG/0.4ML ~~LOC~~ SOLN: 40.0000 mg | SUBCUTANEOUS | Status: DC

## 2014-05-12 MED ORDER — ONDANSETRON HCL 4 MG/2ML IJ SOLN
INTRAMUSCULAR | Status: AC
  Filled 2014-05-12: qty 2

## 2014-05-12 MED ORDER — BUPIVACAINE LIPOSOME 1.3 % IJ SUSP
20.0000 mL | Freq: Once | INTRAMUSCULAR | Status: AC
  Administered 2014-05-12: 20 mL
  Filled 2014-05-12: qty 20

## 2014-05-12 MED ORDER — NALOXONE HCL 0.4 MG/ML IJ SOLN: 0.4000 mg | INTRAMUSCULAR | Status: DC | PRN

## 2014-05-12 MED ORDER — PROPOFOL 10 MG/ML IV BOLUS
INTRAVENOUS | Status: AC
  Filled 2014-05-12: qty 20

## 2014-05-12 MED ORDER — LACTATED RINGERS IV SOLN
INTRAVENOUS | Status: DC | PRN
  Administered 2014-05-12 (×2): via INTRAVENOUS

## 2014-05-12 MED ORDER — CEFAZOLIN SODIUM-DEXTROSE 2-3 GM-% IV SOLR
2.0000 g | INTRAVENOUS | Status: AC
  Administered 2014-05-12: 2 g via INTRAVENOUS

## 2014-05-12 MED ORDER — ENOXAPARIN SODIUM 30 MG/0.3ML ~~LOC~~ SOLN
30.0000 mg | SUBCUTANEOUS | Status: DC
  Administered 2014-05-13 – 2014-05-14 (×2): 30 mg via SUBCUTANEOUS
  Filled 2014-05-12 (×3): qty 0.3

## 2014-05-12 MED ORDER — SUCCINYLCHOLINE CHLORIDE 20 MG/ML IJ SOLN
INTRAMUSCULAR | Status: DC | PRN
  Administered 2014-05-12: 100 mg via INTRAVENOUS

## 2014-05-12 MED ORDER — FENTANYL CITRATE 0.05 MG/ML IJ SOLN
25.0000 ug | INTRAMUSCULAR | Status: DC | PRN
  Administered 2014-05-12: 50 ug via INTRAVENOUS
  Administered 2014-05-12 (×3): 25 ug via INTRAVENOUS

## 2014-05-12 MED ORDER — LACTATED RINGERS IV SOLN
INTRAVENOUS | Status: DC | PRN
  Administered 2014-05-12 (×3): via INTRAVENOUS

## 2014-05-12 MED ORDER — KCL IN DEXTROSE-NACL 20-5-0.45 MEQ/L-%-% IV SOLN
INTRAVENOUS | Status: AC
  Filled 2014-05-12: qty 1000

## 2014-05-12 MED ORDER — NEOSTIGMINE METHYLSULFATE 10 MG/10ML IV SOLN
INTRAVENOUS | Status: DC | PRN
  Administered 2014-05-12: 4 mg via INTRAVENOUS

## 2014-05-12 MED ORDER — METRONIDAZOLE IN NACL 5-0.79 MG/ML-% IV SOLN
INTRAVENOUS | Status: AC
  Filled 2014-05-12: qty 100

## 2014-05-12 MED ORDER — DILTIAZEM HCL ER 180 MG PO CP24
180.0000 mg | ORAL_CAPSULE | Freq: Every morning | ORAL | Status: DC
  Administered 2014-05-13 – 2014-05-15 (×3): 180 mg via ORAL
  Filled 2014-05-12 (×4): qty 1

## 2014-05-12 MED ORDER — METOPROLOL TARTRATE 50 MG PO TABS
50.0000 mg | ORAL_TABLET | Freq: Two times a day (BID) | ORAL | Status: DC
  Administered 2014-05-13 – 2014-05-15 (×5): 50 mg via ORAL
  Filled 2014-05-12 (×9): qty 1

## 2014-05-12 MED ORDER — ONDANSETRON HCL 4 MG/2ML IJ SOLN
INTRAMUSCULAR | Status: DC | PRN
  Administered 2014-05-12: 4 mg via INTRAVENOUS

## 2014-05-12 MED ORDER — LACTATED RINGERS IV SOLN
INTRAVENOUS | Status: DC
  Administered 2014-05-12: 1000 mL via INTRAVENOUS

## 2014-05-12 MED ORDER — LIDOCAINE HCL (CARDIAC) 20 MG/ML IV SOLN
INTRAVENOUS | Status: DC | PRN
  Administered 2014-05-12: 80 mg via INTRAVENOUS

## 2014-05-12 MED ORDER — HYDROMORPHONE 0.3 MG/ML IV SOLN
INTRAVENOUS | Status: AC
  Filled 2014-05-12: qty 25

## 2014-05-12 MED ORDER — INSULIN ASPART 100 UNIT/ML ~~LOC~~ SOLN
0.0000 [IU] | SUBCUTANEOUS | Status: DC
  Administered 2014-05-12 – 2014-05-13 (×2): 5 [IU] via SUBCUTANEOUS

## 2014-05-12 MED ORDER — CEFAZOLIN SODIUM 1-5 GM-% IV SOLN
INTRAVENOUS | Status: DC | PRN
  Administered 2014-05-12: 2 g via INTRAVENOUS

## 2014-05-12 MED ORDER — NEOSTIGMINE METHYLSULFATE 10 MG/10ML IV SOLN
INTRAVENOUS | Status: AC
  Filled 2014-05-12: qty 1

## 2014-05-12 MED ORDER — LEVOTHYROXINE SODIUM 100 MCG PO TABS
100.0000 ug | ORAL_TABLET | ORAL | Status: DC
  Administered 2014-05-13 – 2014-05-15 (×3): 100 ug via ORAL
  Filled 2014-05-12 (×5): qty 1

## 2014-05-12 MED ORDER — ACETAMINOPHEN 10 MG/ML IV SOLN
1000.0000 mg | Freq: Once | INTRAVENOUS | Status: AC
  Administered 2014-05-12: 1000 mg via INTRAVENOUS
  Filled 2014-05-12: qty 100

## 2014-05-12 MED ORDER — KCL IN DEXTROSE-NACL 20-5-0.45 MEQ/L-%-% IV SOLN
INTRAVENOUS | Status: DC
  Administered 2014-05-13 (×2): via INTRAVENOUS
  Filled 2014-05-12 (×3): qty 1000

## 2014-05-12 MED ORDER — CISATRACURIUM BESYLATE (PF) 10 MG/5ML IV SOLN
INTRAVENOUS | Status: DC | PRN
  Administered 2014-05-12 (×2): 4 mg via INTRAVENOUS
  Administered 2014-05-12 (×3): 2 mg via INTRAVENOUS

## 2014-05-12 MED ORDER — LACTATED RINGERS IV SOLN: INTRAVENOUS | Status: DC

## 2014-05-12 MED ORDER — OXYCODONE-ACETAMINOPHEN 5-325 MG PO TABS: 1.0000 | ORAL_TABLET | ORAL | Status: DC | PRN

## 2014-05-12 MED ORDER — FENTANYL CITRATE 0.05 MG/ML IJ SOLN
INTRAMUSCULAR | Status: AC
  Filled 2014-05-12: qty 5

## 2014-05-12 MED ORDER — HYDROMORPHONE HCL PF 2 MG/ML IJ SOLN
INTRAMUSCULAR | Status: AC
  Filled 2014-05-12: qty 1

## 2014-05-12 MED ORDER — INSULIN GLARGINE 100 UNIT/ML ~~LOC~~ SOLN
12.0000 [IU] | Freq: Every day | SUBCUTANEOUS | Status: DC
  Administered 2014-05-13: 12 [IU] via SUBCUTANEOUS
  Filled 2014-05-12: qty 0.12

## 2014-05-12 MED ORDER — ONDANSETRON HCL 4 MG/2ML IJ SOLN: 4.0000 mg | Freq: Four times a day (QID) | INTRAMUSCULAR | Status: DC | PRN

## 2014-05-12 MED ORDER — FENTANYL CITRATE 0.05 MG/ML IJ SOLN
INTRAMUSCULAR | Status: DC | PRN
  Administered 2014-05-12 (×2): 50 ug via INTRAVENOUS
  Administered 2014-05-12: 100 ug via INTRAVENOUS
  Administered 2014-05-12: 50 ug via INTRAVENOUS

## 2014-05-12 MED ORDER — ENOXAPARIN SODIUM 30 MG/0.3ML ~~LOC~~ SOLN
30.0000 mg | SUBCUTANEOUS | Status: AC
  Administered 2014-05-12: 30 mg via SUBCUTANEOUS
  Filled 2014-05-12: qty 0.3

## 2014-05-12 SURGICAL SUPPLY — 72 items
ATTRACTOMAT 16X20 MAGNETIC DRP (DRAPES) ×3 IMPLANT
BAG URINE DRAINAGE (UROLOGICAL SUPPLIES) IMPLANT
BANDAGE GAUZE ELAST BULKY 4 IN (GAUZE/BANDAGES/DRESSINGS) ×3 IMPLANT
BLADE SURG 15 STRL LF DISP TIS (BLADE) ×2 IMPLANT
BLADE SURG 15 STRL SS (BLADE) ×6
CANISTER SUCTION 2500CC (MISCELLANEOUS) IMPLANT
CHLORAPREP W/TINT 26ML (MISCELLANEOUS) ×3 IMPLANT
CLEANER TIP ELECTROSURG 2X2 (MISCELLANEOUS) ×3 IMPLANT
CLIP TI MEDIUM 6 (CLIP) ×3 IMPLANT
CLIP TI MEDIUM LARGE 6 (CLIP) IMPLANT
CONT SPEC 4OZ CLIKSEAL STRL BL (MISCELLANEOUS) IMPLANT
COVER SURGICAL LIGHT HANDLE (MISCELLANEOUS) ×6 IMPLANT
DRAIN CHANNEL RND F F (WOUND CARE) ×6 IMPLANT
DRAPE LG THREE QUARTER DISP (DRAPES) ×6 IMPLANT
DRAPE UTILITY XL STRL (DRAPES) ×3 IMPLANT
DRAPE WARM FLUID 44X44 (DRAPE) ×3 IMPLANT
DRESSING TELFA ISLAND 4X8 (GAUZE/BANDAGES/DRESSINGS) ×3 IMPLANT
DRSG PAD ABDOMINAL 8X10 ST (GAUZE/BANDAGES/DRESSINGS) ×3 IMPLANT
ELECT BLADE 6.5 EXT (BLADE) ×3 IMPLANT
ELECT REM PT RETURN 9FT ADLT (ELECTROSURGICAL) ×3
ELECTRODE REM PT RTRN 9FT ADLT (ELECTROSURGICAL) ×1 IMPLANT
EVACUATOR SILICONE 100CC (DRAIN) ×6 IMPLANT
FLOSEAL 10ML (HEMOSTASIS) ×3 IMPLANT
GAUZE SPONGE 4X4 16PLY XRAY LF (GAUZE/BANDAGES/DRESSINGS) ×3 IMPLANT
GLOVE BIO SURGEON STRL SZ 6.5 (GLOVE) ×4 IMPLANT
GLOVE BIO SURGEON STRL SZ7.5 (GLOVE) ×9 IMPLANT
GLOVE BIO SURGEONS STRL SZ 6.5 (GLOVE) ×2
GLOVE BIOGEL M STRL SZ7.5 (GLOVE) ×9 IMPLANT
GOWN STRL REUS W/TWL LRG LVL3 (GOWN DISPOSABLE) ×15 IMPLANT
GOWN STRL REUS W/TWL XL LVL3 (GOWN DISPOSABLE) ×6 IMPLANT
KIT BASIN OR (CUSTOM PROCEDURE TRAY) ×6 IMPLANT
LIGASURE IMPACT 36 18CM CVD LR (INSTRUMENTS) ×6 IMPLANT
MARKER SKIN DUAL TIP RULER LAB (MISCELLANEOUS) ×3 IMPLANT
NS IRRIG 1000ML POUR BTL (IV SOLUTION) IMPLANT
PACK GENERAL/GYN (CUSTOM PROCEDURE TRAY) ×3 IMPLANT
PACK ICE MAXI GEL EZY WRAP (MISCELLANEOUS) IMPLANT
PACK MINOR VAGINAL W LONG (CUSTOM PROCEDURE TRAY) ×3 IMPLANT
PAD ABD 8X10 STRL (GAUZE/BANDAGES/DRESSINGS) ×3 IMPLANT
PIN HEMORRHAGE OCCLUDER (PIN) ×6
PIN HMRHG STD 7X10XAPL SRR (PIN) ×2 IMPLANT
POUCH OSTOMY 1 PC DRNBL  2 1/2 (OSTOMY) ×1 IMPLANT
POUCH OSTOMY 2 1/2 (OSTOMY) ×3
RELOAD PROXIMATE 75MM BLUE (ENDOMECHANICALS) ×3 IMPLANT
SHEET LAVH (DRAPES) ×3 IMPLANT
SPONGE DRAIN TRACH 4X4 STRL 2S (GAUZE/BANDAGES/DRESSINGS) ×3 IMPLANT
SPONGE GAUZE 4X4 12PLY (GAUZE/BANDAGES/DRESSINGS) IMPLANT
SPONGE LAP 18X18 X RAY DECT (DISPOSABLE) ×12 IMPLANT
SPONGE SURGIFOAM ABS GEL 100 (HEMOSTASIS) ×3 IMPLANT
STAPLER PROXIMATE 75MM BLUE (STAPLE) ×3 IMPLANT
STAPLER VISISTAT 35W (STAPLE) ×3 IMPLANT
SUT ETHILON 1 LR 30 (SUTURE) IMPLANT
SUT PDS AB 1 CTX 36 (SUTURE) IMPLANT
SUT PDS AB 1 CTXB1 36 (SUTURE) ×6 IMPLANT
SUT SILK 2 0 (SUTURE) ×6
SUT SILK 2 0 30  PSL (SUTURE)
SUT SILK 2 0 30 PSL (SUTURE) IMPLANT
SUT SILK 2-0 18XBRD TIE 12 (SUTURE) ×1 IMPLANT
SUT SILK 2-0 30XBRD TIE 12 (SUTURE) ×1 IMPLANT
SUT SILK 3 0 SH CR/8 (SUTURE) ×3 IMPLANT
SUT VIC AB 0 CT1 36 (SUTURE) ×15 IMPLANT
SUT VIC AB 2-0 CT2 27 (SUTURE) ×21 IMPLANT
SUT VIC AB 2-0 SH 27 (SUTURE) ×12
SUT VIC AB 2-0 SH 27X BRD (SUTURE) ×4 IMPLANT
SUT VIC AB 3-0 CTX 36 (SUTURE) IMPLANT
SUT VIC AB 3-0 SH 27 (SUTURE)
SUT VIC AB 3-0 SH 27X BRD (SUTURE) IMPLANT
SUT VICRYL 2 0 18  UND BR (SUTURE) ×2
SUT VICRYL 2 0 18 UND BR (SUTURE) ×1 IMPLANT
TOWEL OR 17X26 10 PK STRL BLUE (TOWEL DISPOSABLE) ×3 IMPLANT
TOWEL OR NON WOVEN STRL DISP B (DISPOSABLE) ×3 IMPLANT
TRAY FOLEY CATH 14FRSI W/METER (CATHETERS) ×3 IMPLANT
WATER STERILE IRR 1500ML POUR (IV SOLUTION) IMPLANT

## 2014-05-12 NOTE — H&P (View-Only) (Signed)
Consult Note: Gyn-Onc   [name] 80 y.o. female  Chief Complaint  Patient presents with  . Vulvar Cancer   assessment t: Recurrent vulvar cancer. Severe anal pain.. Constipation  Plan: A biopsy is obtained (however there is a small specimen given the patient's pain and may be nondiagnostic). In either event, (either recurrent persistent vulvar cancer or a radiation ulcer) ablator the only significant therapeutic option would be to resect the vulvar and anal lesion and create a diverting colostomy. Patient has a number of medical comorbidities. We will have her see a cardiologist tomorrow and I have contacted Dr.Mark Etter Sjogren and [facility] to evaluate [name] with regard to potential for surgery.  We will tentatively schedule surgery for May 19. The extent of the procedure is discussed with the patient and her husband and their agreement to proceed if medically reasonable. The alternative would be to manage her pain and arrange for hospice care.  Patient given a prescription for OxyContin 10 mg every 12 hours and an additional prescription for oxycodone 5 mg every 4 hours as needed for breakthrough pain. We'll have the patient see the enterostomal therapy nurse for marking and preliminary education about colostomy care. Interval History: Patient returns today because of increasing pain in the posterior vulva and anus. Patient is been taking oxycodone but has not really receiving much pain relief. The pain is worse than when I saw her last period she denies any bleeding. She is having difficulty with constipation probably secondary to using oxycodone as well as pain around her anus. Otherwise her health has been stable.   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 At that time she underwent a modified radical vulvectomy and right inguinal lymphadenectomy. Lymph nodes and surgical margins were negative. She subsequently developed a recurrence on the left vulva in  October 2008 underwent left modified radical vulvectomy. Given her age and overall poor performance status we did not perform an inguinal lymphadenectomy.  The patient subsequently had a third recurrence of October 2009 and this was excised and the rhomboid flap was used to fill the surgical defect. Surgical margins were again negative. Her most recent recurrences in September 2012 which is again excised.  She had a superficial wound separation which closed by secondary intent.   She had yet another recurrence in November 2014 treated with external beam radiation therapy because of the lesion location very close to the anus.     Review of System Ten point review of systems is negative except as noted above      Allergies  Allergen Reactions  . Tenex [Guanfacine Hcl] Other (See Comments)    hypotension  . Crestor [Rosuvastatin] Other (See Comments)    Myalgia    Past Medical History  Diagnosis Date  . Diabetes mellitus   . Hypertension   . Hyperlipidemia   . Obesity   . Hypothyroidism   . Vulvar cancer, carcinoma 11/03/2011  . Stroke 05/2000    right brain CVA pre H&P 2001  . Atrial fibrillation   . Cancer 2007    vulva-invasive well diff squamous cell ca  . CVA (cerebral infarction)   . Dyslipidemia   . History of radiation therapy 10/20/2013-12/01/2013    60 gray to perineum area and lower pelvis    Past Surgical History  Procedure Laterality Date  . Vulvar lesion removal  2012  . Breast surgery      Lumpectomy  . Dilation and curettage of uterus    .  Cystourethroplasty / ureteroneocystostomy    . Radical vulvectomy    . Lymphadenectomy  2007    R inguinal femoral  . Radical vulvectomy  02/27/12  . Vulvar lesion removal  02/27/2012    Procedure: VULVAR LESION;  Surgeon: Alvino Chapel, MD;  Location: [facility];  Service: Gynecology;  Laterality: N/A;  . Right carotid endarterctomy    . Left eye surgery      , permanently stitched eye together    Current  Outpatient Prescriptions  Medication Sig Dispense Refill  . atorvastatin (LIPITOR) 20 MG tablet Take 1 tablet (20 mg total) by mouth at bedtime.  30 tablet  6  . calcitRIOL (ROCALTROL) 0.25 MCG capsule Take 1 capsule 3 times a week  12 capsule  3  . CARTIA XT 180 MG 24 hr capsule TAKE 1 CAPSULE DAILY  90 capsule  5  . diltiazem (CARDIZEM CD) 360 MG 24 hr capsule Take 180 mg by mouth daily.       . furosemide (LASIX) 20 MG tablet Take 1 tablet (20 mg total) by mouth daily.  30 tablet  3  . glucose blood (ONE TOUCH TEST STRIPS) test strip Use as instructed to check blood sugars 4 times per day dx code 250.00  150 each  12  . insulin detemir (LEVEMIR) 100 UNIT/ML injection Inject 20 Units into the skin 2 (two) times daily. 30 in am and 20 in pm      . insulin lispro (HUMALOG KWIKPEN) 100 UNIT/ML KiwkPen INJECT 20 TO 28 UNITS UNDER THE SKIN THREE TIMES A DAY WITH MEALS (20UNITS AT BREAKFAST, 25 UNITS AT LUNCH, AND 30 UNITS AT SUPPER)      . Insulin Pen Needle 32G X 4 MM MISC Use 5 needles per day  150 each  3  . lactulose (CHRONULAC) 10 GM/15ML solution       . levothyroxine (SYNTHROID, LEVOTHROID) 100 MCG tablet TAKE 1 TABLET EVERY DAY BUT SUNDAY  90 tablet  2  . lidocaine (XYLOCAINE) 5 % ointment       . metoprolol succinate (TOPROL-XL) 25 MG 24 hr tablet TAKE 2 TABLETS DAILY  180 tablet  2  . oxyCODONE (OXY IR/ROXICODONE) 5 MG immediate release tablet Take 1 tablet (5 mg total) by mouth every 4 (four) hours as needed for severe pain.  30 tablet  0  . potassium chloride (KLOR-CON M10) 10 MEQ tablet Take 1 tablet (10 mEq total) by mouth daily.  30 tablet  5  . silver sulfADIAZINE (SILVADENE) 1 % cream Apply 1 application topically daily.      [name] warfarin (COUMADIN) 5 MG tablet Take as directed by Anticoagulation clinic  30 tablet  3   No current facility-administered medications for this visit.    History   Social History  . Marital Status: Married    Spouse Name: N/A    Number of Children:  N/A  . Years of Education: N/A   Occupational History  . Not on file.   Social History Main Topics  . Smoking status: Former Smoker -- 0.25 packs/day for 18 years    Types: Cigarettes    Quit date: 11/03/1967  . Smokeless tobacco: Not on file  . Alcohol Use: No  . Drug Use: No  . Sexual Activity: Not Currently   Other Topics Concern  . Not on file   Social History Narrative  . No narrative on file    Family History  Problem Relation Age of Onset  . Diabetes  type II    . Coronary artery disease Mother   . Lung cancer Brother   . Diabetes type II Brother        Vitals: Blood pressure 95/64, pulse 123, temperature 98.5 F (36.9 C), resp. rate 24.  Physical Exam: In general the patient is a pleasant elderly white female no acute distress.  HEENT is negative except that her left eyelid has been partially closed surgically.. Neck is supple without thyromegaly.  There is no supraclavicular or inguinal adenopathy.  The abdomen is soft nontender no masses organomegaly or ascites is noted.  Pelvic exam EGBUS is status post radical vulvectomy with advancement flaps. There is an ulcerated area on the left perianal region where the recurrence recently was treated with radiation therapy. Certainly the exophytic portion of the tumor has resolved.  At the present time, I presume this is still recovering from radiation therapy. No new lesions are noted. On palpation area is painful.     Alvino Chapel, MD 04/28/2014, 12:57 PM                         Consult Note: Gyn-Onc   [name] 80 y.o. female  Chief Complaint  Patient presents with  . Vulvar Cancer    Interval History:   HPI:  Allergies  Allergen Reactions  . Tenex [Guanfacine Hcl] Other (See Comments)    hypotension  . Crestor [Rosuvastatin] Other (See Comments)    Myalgia    Past Medical History  Diagnosis Date  . Diabetes mellitus   . Hypertension   . Hyperlipidemia    . Obesity   . Hypothyroidism   . Vulvar cancer, carcinoma 11/03/2011  . Stroke 05/2000    right brain CVA pre H&P 2001  . Atrial fibrillation   . Cancer 2007    vulva-invasive well diff squamous cell ca  . CVA (cerebral infarction)   . Dyslipidemia   . History of radiation therapy 10/20/2013-12/01/2013    60 gray to perineum area and lower pelvis    Past Surgical History  Procedure Laterality Date  . Vulvar lesion removal  2012  . Breast surgery      Lumpectomy  . Dilation and curettage of uterus    . Cystourethroplasty / ureteroneocystostomy    . Radical vulvectomy    . Lymphadenectomy  2007    R inguinal femoral  . Radical vulvectomy  02/27/12  . Vulvar lesion removal  02/27/2012    Procedure: VULVAR LESION;  Surgeon: Alvino Chapel, MD;  Location: [facility];  Service: Gynecology;  Laterality: N/A;  . Right carotid endarterctomy    . Left eye surgery      , permanently stitched eye together    Current Outpatient Prescriptions  Medication Sig Dispense Refill  . atorvastatin (LIPITOR) 20 MG tablet Take 1 tablet (20 mg total) by mouth at bedtime.  30 tablet  6  . calcitRIOL (ROCALTROL) 0.25 MCG capsule Take 1 capsule 3 times a week  12 capsule  3  . CARTIA XT 180 MG 24 hr capsule TAKE 1 CAPSULE DAILY  90 capsule  5  . diltiazem (CARDIZEM CD) 360 MG 24 hr capsule Take 180 mg by mouth daily.       . furosemide (LASIX) 20 MG tablet Take 1 tablet (20 mg total) by mouth daily.  30 tablet  3  . glucose blood (ONE TOUCH TEST STRIPS) test strip Use as instructed to check blood sugars 4  times per day dx code 250.00  150 each  12  . insulin detemir (LEVEMIR) 100 UNIT/ML injection Inject 20 Units into the skin 2 (two) times daily. 30 in am and 20 in pm      . insulin lispro (HUMALOG KWIKPEN) 100 UNIT/ML KiwkPen INJECT 20 TO 28 UNITS UNDER THE SKIN THREE TIMES A DAY WITH MEALS (20UNITS AT BREAKFAST, 25 UNITS AT LUNCH, AND 30 UNITS AT SUPPER)      . Insulin Pen Needle 32G X 4 MM MISC  Use 5 needles per day  150 each  3  . lactulose (CHRONULAC) 10 GM/15ML solution       . levothyroxine (SYNTHROID, LEVOTHROID) 100 MCG tablet TAKE 1 TABLET EVERY DAY BUT SUNDAY  90 tablet  2  . lidocaine (XYLOCAINE) 5 % ointment       . metoprolol succinate (TOPROL-XL) 25 MG 24 hr tablet TAKE 2 TABLETS DAILY  180 tablet  2  . oxyCODONE (OXY IR/ROXICODONE) 5 MG immediate release tablet Take 1 tablet (5 mg total) by mouth every 4 (four) hours as needed for severe pain.  30 tablet  0  . potassium chloride (KLOR-CON M10) 10 MEQ tablet Take 1 tablet (10 mEq total) by mouth daily.  30 tablet  5  . silver sulfADIAZINE (SILVADENE) 1 % cream Apply 1 application topically daily.      [name] warfarin (COUMADIN) 5 MG tablet Take as directed by Anticoagulation clinic  30 tablet  3   No current facility-administered medications for this visit.    History   Social History  . Marital Status: Married    Spouse Name: N/A    Number of Children: N/A  . Years of Education: N/A   Occupational History  . Not on file.   Social History Main Topics  . Smoking status: Former Smoker -- 0.25 packs/day for 18 years    Types: Cigarettes    Quit date: 11/03/1967  . Smokeless tobacco: Not on file  . Alcohol Use: No  . Drug Use: No  . Sexual Activity: Not Currently   Other Topics Concern  . Not on file   Social History Narrative  . No narrative on file    Family History  Problem Relation Age of Onset  . Diabetes type II    . Coronary artery disease Mother   . Lung cancer Brother   . Diabetes type II Brother     Review of Systems:  Vitals: Blood pressure 95/64, pulse 123, temperature 98.5 F (36.9 C), resp. rate 24.  Physical Exam:  Assessment/Plan:   Alvino Chapel, MD 04/28/2014, 12:57 PM                         Consult Note: Gyn-Onc   [name] 80 y.o. female  Chief Complaint  Patient presents with  . Vulvar Cancer    Interval History: The patient  returns today  last being seen in April 2013. Subsequent to that visit, she's had a number of medical problems including a stroke, atrial fibrillation, and some ophthalmologic problems. On direct questioning the patient does admit and her husband concurs that there is a new lesion on her vulva. She says it "stings". She has not had any bleeding.  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 At that time she underwent a modified radical vulvectomy and right inguinal lymphadenectomy. Lymph nodes and surgical margins were negative. She subsequently developed a recurrence  on the left vulva in October 2008 underwent left modified radical vulvectomy. Given her age and overall poor performance status we did not perform an inguinal lymphadenectomy.  The patient subsequently had a third recurrence of October 2009 and this was excised and the rhomboid flap was used to fill the surgical defect. Surgical margins were again negative. Her most recent recurrences in September 2012 which is again excised.  She had a superficial wound separation which closed by secondary intent.      Review of System Ten point review of systems is negative except as noted above      Allergies  Allergen Reactions  . Tenex [Guanfacine Hcl] Other (See Comments)    hypotension  . Crestor [Rosuvastatin] Other (See Comments)    Myalgia    Past Medical History  Diagnosis Date  . Diabetes mellitus   . Hypertension   . Hyperlipidemia   . Obesity   . Hypothyroidism   . Vulvar cancer, carcinoma 11/03/2011  . Stroke 05/2000    right brain CVA pre H&P 2001  . Atrial fibrillation   . Cancer 2007    vulva-invasive well diff squamous cell ca  . CVA (cerebral infarction)   . Dyslipidemia   . History of radiation therapy 10/20/2013-12/01/2013    60 gray to perineum area and lower pelvis    Past Surgical History  Procedure Laterality Date  . Vulvar lesion removal  2012  . Breast surgery       Lumpectomy  . Dilation and curettage of uterus    . Cystourethroplasty / ureteroneocystostomy    . Radical vulvectomy    . Lymphadenectomy  2007    R inguinal femoral  . Radical vulvectomy  02/27/12  . Vulvar lesion removal  02/27/2012    Procedure: VULVAR LESION;  Surgeon: Alvino Chapel, MD;  Location: [facility];  Service: Gynecology;  Laterality: N/A;  . Right carotid endarterctomy    . Left eye surgery      , permanently stitched eye together    Current Outpatient Prescriptions  Medication Sig Dispense Refill  . atorvastatin (LIPITOR) 20 MG tablet Take 1 tablet (20 mg total) by mouth at bedtime.  30 tablet  6  . calcitRIOL (ROCALTROL) 0.25 MCG capsule Take 1 capsule 3 times a week  12 capsule  3  . CARTIA XT 180 MG 24 hr capsule TAKE 1 CAPSULE DAILY  90 capsule  5  . diltiazem (CARDIZEM CD) 360 MG 24 hr capsule Take 180 mg by mouth daily.       . furosemide (LASIX) 20 MG tablet Take 1 tablet (20 mg total) by mouth daily.  30 tablet  3  . glucose blood (ONE TOUCH TEST STRIPS) test strip Use as instructed to check blood sugars 4 times per day dx code 250.00  150 each  12  . insulin detemir (LEVEMIR) 100 UNIT/ML injection Inject 20 Units into the skin 2 (two) times daily. 30 in am and 20 in pm      . insulin lispro (HUMALOG KWIKPEN) 100 UNIT/ML KiwkPen INJECT 20 TO 28 UNITS UNDER THE SKIN THREE TIMES A DAY WITH MEALS (20UNITS AT BREAKFAST, 25 UNITS AT LUNCH, AND 30 UNITS AT SUPPER)      . Insulin Pen Needle 32G X 4 MM MISC Use 5 needles per day  150 each  3  . lactulose (CHRONULAC) 10 GM/15ML solution       . levothyroxine (SYNTHROID, LEVOTHROID) 100 MCG tablet TAKE 1 TABLET EVERY DAY BUT  SUNDAY  90 tablet  2  . lidocaine (XYLOCAINE) 5 % ointment       . metoprolol succinate (TOPROL-XL) 25 MG 24 hr tablet TAKE 2 TABLETS DAILY  180 tablet  2  . oxyCODONE (OXY IR/ROXICODONE) 5 MG immediate release tablet Take 1 tablet (5 mg total) by mouth every 4 (four) hours as needed for severe pain.   30 tablet  0  . potassium chloride (KLOR-CON M10) 10 MEQ tablet Take 1 tablet (10 mEq total) by mouth daily.  30 tablet  5  . silver sulfADIAZINE (SILVADENE) 1 % cream Apply 1 application topically daily.      [name] warfarin (COUMADIN) 5 MG tablet Take as directed by Anticoagulation clinic  30 tablet  3   No current facility-administered medications for this visit.    History   Social History  . Marital Status: Married    Spouse Name: N/A    Number of Children: N/A  . Years of Education: N/A   Occupational History  . Not on file.   Social History Main Topics  . Smoking status: Former Smoker -- 0.25 packs/day for 18 years    Types: Cigarettes    Quit date: 11/03/1967  . Smokeless tobacco: Not on file  . Alcohol Use: No  . Drug Use: No  . Sexual Activity: Not Currently   Other Topics Concern  . Not on file   Social History Narrative  . No narrative on file    Family History  Problem Relation Age of Onset  . Diabetes type II    . Coronary artery disease Mother   . Lung cancer Brother   . Diabetes type II Brother        Vitals: Blood pressure 95/64, pulse 123, temperature 98.5 F (36.9 C), resp. rate 24.  Physical Exam: In general the patient is a pleasant elderly white female no acute distress.  HEENT is negative except that her left eyelid has been partially closed surgically.. Neck is supple without thyromegaly.  There is no supraclavicular or inguinal adenopathy.  The abdomen is soft nontender no masses organomegaly or ascites is noted.  Pelvic exam EGBUS is status post radical vulvectomy with advancement flaps. She now has a new 4 cm fungating lesion just to the left of the anus. Assessment/Plan:recurrent vulvar cancer adjacent to the anus.   Given the location of this new lesion, I do not believe that surgery would be appropriate as the patient would require an extensive operation including resection of the anal sphincter colostomy. Therefore would like to  refer her to radiation oncology for consideration of external beam radiation therapy to this lesion. The patient and her husband are in agreement with this plan.   Alvino Chapel, MD 04/28/2014, 12:57 PM                         Consult Note: Gyn-Onc   [name] 80 y.o. female  Chief Complaint  Patient presents with  . Vulvar Cancer    Interval History:   HPI:  Allergies  Allergen Reactions  . Tenex [Guanfacine Hcl] Other (See Comments)    hypotension  . Crestor [Rosuvastatin] Other (See Comments)    Myalgia    Past Medical History  Diagnosis Date  . Diabetes mellitus   . Hypertension   . Hyperlipidemia   . Obesity   . Hypothyroidism   . Vulvar cancer, carcinoma 11/03/2011  . Stroke 05/2000    right brain  CVA pre H&P 2001  . Atrial fibrillation   . Cancer 2007    vulva-invasive well diff squamous cell ca  . CVA (cerebral infarction)   . Dyslipidemia   . History of radiation therapy 10/20/2013-12/01/2013    60 gray to perineum area and lower pelvis    Past Surgical History  Procedure Laterality Date  . Vulvar lesion removal  2012  . Breast surgery      Lumpectomy  . Dilation and curettage of uterus    . Cystourethroplasty / ureteroneocystostomy    . Radical vulvectomy    . Lymphadenectomy  2007    R inguinal femoral  . Radical vulvectomy  02/27/12  . Vulvar lesion removal  02/27/2012    Procedure: VULVAR LESION;  Surgeon: Alvino Chapel, MD;  Location: [facility];  Service: Gynecology;  Laterality: N/A;  . Right carotid endarterctomy    . Left eye surgery      , permanently stitched eye together    Current Outpatient Prescriptions  Medication Sig Dispense Refill  . atorvastatin (LIPITOR) 20 MG tablet Take 1 tablet (20 mg total) by mouth at bedtime.  30 tablet  6  . calcitRIOL (ROCALTROL) 0.25 MCG capsule Take 1 capsule 3 times a week  12 capsule  3  . CARTIA XT 180 MG 24 hr capsule TAKE 1 CAPSULE DAILY  90 capsule  5   . diltiazem (CARDIZEM CD) 360 MG 24 hr capsule Take 180 mg by mouth daily.       . furosemide (LASIX) 20 MG tablet Take 1 tablet (20 mg total) by mouth daily.  30 tablet  3  . glucose blood (ONE TOUCH TEST STRIPS) test strip Use as instructed to check blood sugars 4 times per day dx code 250.00  150 each  12  . insulin detemir (LEVEMIR) 100 UNIT/ML injection Inject 20 Units into the skin 2 (two) times daily. 30 in am and 20 in pm      . insulin lispro (HUMALOG KWIKPEN) 100 UNIT/ML KiwkPen INJECT 20 TO 28 UNITS UNDER THE SKIN THREE TIMES A DAY WITH MEALS (20UNITS AT BREAKFAST, 25 UNITS AT LUNCH, AND 30 UNITS AT SUPPER)      . Insulin Pen Needle 32G X 4 MM MISC Use 5 needles per day  150 each  3  . lactulose (CHRONULAC) 10 GM/15ML solution       . levothyroxine (SYNTHROID, LEVOTHROID) 100 MCG tablet TAKE 1 TABLET EVERY DAY BUT SUNDAY  90 tablet  2  . lidocaine (XYLOCAINE) 5 % ointment       . metoprolol succinate (TOPROL-XL) 25 MG 24 hr tablet TAKE 2 TABLETS DAILY  180 tablet  2  . oxyCODONE (OXY IR/ROXICODONE) 5 MG immediate release tablet Take 1 tablet (5 mg total) by mouth every 4 (four) hours as needed for severe pain.  30 tablet  0  . potassium chloride (KLOR-CON M10) 10 MEQ tablet Take 1 tablet (10 mEq total) by mouth daily.  30 tablet  5  . silver sulfADIAZINE (SILVADENE) 1 % cream Apply 1 application topically daily.      [name] warfarin (COUMADIN) 5 MG tablet Take as directed by Anticoagulation clinic  30 tablet  3   No current facility-administered medications for this visit.    History   Social History  . Marital Status: Married    Spouse Name: N/A    Number of Children: N/A  . Years of Education: N/A   Occupational History  . Not on file.  Social History Main Topics  . Smoking status: Former Smoker -- 0.25 packs/day for 18 years    Types: Cigarettes    Quit date: 11/03/1967  . Smokeless tobacco: Not on file  . Alcohol Use: No  . Drug Use: No  . Sexual Activity: Not  Currently   Other Topics Concern  . Not on file   Social History Narrative  . No narrative on file    Family History  Problem Relation Age of Onset  . Diabetes type II    . Coronary artery disease Mother   . Lung cancer Brother   . Diabetes type II Brother     Review of Systems:  Vitals: Blood pressure 95/64, pulse 123, temperature 98.5 F (36.9 C), resp. rate 24.  Physical Exam:  Assessment/Plan:   Alvino Chapel, MD 04/28/2014, 12:57 PM

## 2014-05-12 NOTE — Progress Notes (Signed)
Mr Francesca Strome contacted by RN at his home and given pts room number.  Aware pt transported with dentures to her room.

## 2014-05-12 NOTE — Op Note (Signed)
ERENDIRA CRABTREE  female MEDICAL RECORD ZJ:[id number] DATE OF BIRTH: Jan 26, 1926 PHYSICIAN: Marti Sleigh, M.D  05/12/2014   OPERATIVE REPORT  PREOPERATIVE DIAGNOSIS: Recurrent vulvar carcinoma with severe pain  POSTOPERATIVE DIAGNOSIS: Same  PROCEDURE: Abdominal perineal resection, total abdominal hysterectomy bilateral salpingo-oophorectomy, descending and colostomy, omental J. flap.  SURGEON: Marti Sleigh, M.D ASSISTANT: [name], MD ANESTHESIA: Gen. with oral tracheal tube ESTIMATED BLOOD LOSS: 500 mL  SURGICAL FINDINGS: Examination under anesthesia revealed A. an ulcerative necrotic lesion from the left side of the anus across the perineum and involving the anus over to approximately 10:00. Intra-abdominal exploration revealed no evidence of other disease. The uterus tubes and ovaries appeared normal.  PROCEDURE: The patient was brought to the operating room and after satisfactory attainment of general anesthesia was placed in a modified lithotomy position in [place]. The anterior abdominal wall, perineum, vulva, vagina and medial thighs were prepped and the patient was draped so as to be able to perform both vaginal and abdominal surgery. A Foley catheter was placed. Surgical timeout was taken and antibiotics were given appropriate for colonic surgery. The patient was repositioned into a lithotomy position. The lesion was recognized and a circumferential incision approximately 2-3 cm lateral to the lesion was made circumscribing lateral to the anus and halfway up the lateral vagina. The dissection was then carried to the isheal rectal fossa and until we encountered the levator plate. Dissection was carried superior to the coccyx and medial to the posterior pubic ramus and ischium. Hemostasis throughout the procedure was predominantly achieved using the LigaSure. Once we penetrated the levator plate the dissection turned anteriorly as we continued to incise the  lateral vaginal walls at 3 and 9:00. Once we has gone as far cephalad as possible from the transperineal approach we converted the patient back into a modified lithotomy position.  The anterior abdominal wall was opened through a midline incision. A Bookwalter retractor was positioned and the small bowel was moved out of the pelvis. The retroperitoneal spaces were opened identifying the ureters bilaterally. The ovarian vessels were skeletonized and clamped cut and suture ligated and free tied. The bladder flap was advanced with sharp and blunt dissection. Uterine vessels were skeletonized and clamped cut and suture ligated. In a stepwise fashion the paracervical and cardinal ligaments were clamped cut and suture ligated. The vaginal angles were crossclamped and the cervix transected from its junction with the vagina. The vagina was closed with interrupted figure-of-eight sutures of 0 Vicryl.   Attention was turned to performing the sigmoid colectomy. Sigmoid colon approximately at the pelvic brim was isolated and divided using a GIA stapler. The presacral space was opened and the remainder of the sigmoid mesentery divided with the LigaSure. Incisions medial to the ureters and lateral to the sigmoid colon and rectum were then made in the pelvic peritoneum and pararectal pillars were divided with the LigaSure. We encountered the pack which been placed transperioneally. This was removed and the anus grasped and delivered into the posterior pelvis. The remaining pedicle attaching the specimen to the vagina was divided with the LigaSure. Entire specimen was handed off the operative field. Inspection of the pelvis revealed active bleeding from presacral veins. This could not be controlled with cautery or hemoclips. Ultimately we used 2 thumbtacks placed into the sacrum to control the bleeding. In addition FloSeal and Gelfoam was placed over this area and a pack applied. Attention was then turned to the proximal  sigmoid which was mobilized by dividing the mesentery further  so as to create enough length to create the colostomy.  The omentum was mobilized from the transverse colon from right to left creating an omental J. flap which was then placed deep in the pelvis. The colostomy stoma which had been previously marked was developed and the sigmoid colon brought up through the stoma. This was sutured to the subcuticular layer. The pelvis was reinspected and found to be hemostatic. A 19 Blake drain was placed a stab incision in the right lower quadrant down deep in the pelvis and secured to the skin with 3-0 silk. Packs and retractors removed the anterior abdominal wall was closed in layers the first being a running mass closure using #1 PDS. Subcutaneous tissue was irrigated hemostasis achieved with cautery.Experil was injected into the subcutaneous layer and then the skin closed with skin staples. The staple line and the colostomy was then excised and the colostomy matured. A colostomy bag was placed over the colostomy and a dressing placed over the wound.  Attention was turned to the perineum. It was clear that complete closure would be impossible given the size of the wound. We did reapproximate the levator plate and some subcutaneous tissue. The remainder of the defect was packed with saline soaked Kerlix gauze. An ABD dressing was applied.  Patient was evaluated by anesthesia and felt that her tidal volumes were sufficient to extubate. Patient was transferred to the recovery room in satisfactory condition with intention to admit her to the intensive care unit.  Sponge needle instrument counts correct x2.   Marti Sleigh, M.D

## 2014-05-12 NOTE — Anesthesia Postprocedure Evaluation (Signed)
Anesthesia Post Note  Patient: [name]  Procedure(s) Performed: Procedure(s) (LRB): RADICAL VULVECTOMY WITH COLOSTOMY (N/A) EXPLORATORY LAPAROTOMY, TOTAL ABDO MINAL HYSTERECTOMY AND BILATERAL SALPINGOOPHERECTOMY (N/A)  Anesthesia type: General  Patient location: PACU  Post pain: Pain level controlled  Post assessment: Post-op Vital signs reviewed  Last Vitals:  Filed Vitals:   05/12/14 1830  BP: 113/65  Pulse: 90  Temp:   Resp: 14    Post vital signs: Reviewed  Level of consciousness: sedated  Complications: No apparent anesthesia complications

## 2014-05-12 NOTE — Transfer of Care (Signed)
Immediate Anesthesia Transfer of Care Note  Patient: [name]  Procedure(s) Performed: Procedure(s): RADICAL VULVECTOMY WITH COLOSTOMY (N/A) EXPLORATORY LAPAROTOMY, TOTAL ABDO MINAL HYSTERECTOMY AND BILATERAL SALPINGOOPHERECTOMY (N/A)  Patient Location: PACU  Anesthesia Type:General  Level of Consciousness: awake, alert  and oriented  Airway & Oxygen Therapy: Patient Spontanous Breathing and Patient connected to face mask oxygen  Post-op Assessment: Report given to PACU RN and Post -op Vital signs reviewed and stable  Post vital signs: Reviewed and stable  Complications: No apparent anesthesia complications

## 2014-05-12 NOTE — Interval H&P Note (Signed)
History and Physical Interval Note:  05/12/2014 1:08 PM  [name]  has presented today for surgery, with the diagnosis of VULVAR CANCER  The various methods of treatment have been discussed with the patient and family. After consideration of risks, benefits and other options for treatment, the patient has consented to  Procedure(s): [place] (N/A) EXPLORATORY LAPAROTOMY (N/A) as a surgical intervention .  The patient's history has been reviewed, patient examined, no change in status, stable for surgery.  I have reviewed the patient's chart and labs.  Questions were answered to the patient's satisfaction.    Given the patient's significant medical comorbidities, she's been evaluated by her cardiologist who finds her at moderate risk for surgery but there are no absolute contraindications. We have considered the possibility of alternative therapies however given the patient's excessive pain which is not control I believe the only option to achieve pain control, palliation, and management of her cancers perform essentially an abdominal perineal resection. The patient and her husband have been informed of the extensive nature this procedure and the risks especially considering her comorbidities. The air agreement and wish to proceed with surgery. All her questions are answered.   [place]

## 2014-05-13 ENCOUNTER — Encounter (HOSPITAL_COMMUNITY): Payer: Self-pay

## 2014-05-13 ENCOUNTER — Ambulatory Visit: Payer: Medicare Other | Admitting: Gynecology

## 2014-05-13 LAB — CBC
HCT: 29.6 % — ABNORMAL LOW (ref 36.0–46.0)
Hemoglobin: 9.3 g/dL — ABNORMAL LOW (ref 12.0–15.0)
MCH: 31.3 pg (ref 26.0–34.0)
MCHC: 31.4 g/dL (ref 30.0–36.0)
MCV: 99.7 fL (ref 78.0–100.0)
PLATELETS: 170 10*3/uL (ref 150–400)
RBC: 2.97 MIL/uL — ABNORMAL LOW (ref 3.87–5.11)
RDW: 15.5 % (ref 11.5–15.5)
WBC: 12.8 10*3/uL — ABNORMAL HIGH (ref 4.0–10.5)

## 2014-05-13 LAB — GLUCOSE, CAPILLARY
GLUCOSE-CAPILLARY: 99 mg/dL (ref 70–99)
Glucose-Capillary: 270 mg/dL — ABNORMAL HIGH (ref 70–99)
Glucose-Capillary: 200 mg/dL — ABNORMAL HIGH (ref 70–99)
Glucose-Capillary: 216 mg/dL — ABNORMAL HIGH (ref 70–99)
Glucose-Capillary: 175 mg/dL — ABNORMAL HIGH (ref 70–99)
Glucose-Capillary: 158 mg/dL — ABNORMAL HIGH (ref 70–99)

## 2014-05-13 LAB — BASIC METABOLIC PANEL
BUN: 23 mg/dL (ref 6–23)
CO2: 22 mEq/L (ref 19–32)
Calcium: 7.8 mg/dL — ABNORMAL LOW (ref 8.4–10.5)
Chloride: 103 mEq/L (ref 96–112)
Creatinine, Ser: 1.25 mg/dL — ABNORMAL HIGH (ref 0.50–1.10)
GFR calc Af Amer: 44 mL/min — ABNORMAL LOW (ref >90)
GFR, EST NON AFRICAN AMERICAN: 38 mL/min — AB (ref >90)
Glucose, Bld: 280 mg/dL — ABNORMAL HIGH (ref 70–99)
POTASSIUM: 5.3 meq/L (ref 3.7–5.3)
Sodium: 136 mEq/L — ABNORMAL LOW (ref 137–147)

## 2014-05-13 MED ORDER — ACETAMINOPHEN 10 MG/ML IV SOLN
1000.0000 mg | Freq: Once | INTRAVENOUS | Status: AC
  Administered 2014-05-13: 1000 mg via INTRAVENOUS
  Filled 2014-05-13: qty 100

## 2014-05-13 MED ORDER — INSULIN ASPART 100 UNIT/ML ~~LOC~~ SOLN
0.0000 [IU] | SUBCUTANEOUS | Status: DC
  Administered 2014-05-13 – 2014-05-14 (×2): 3 [IU] via SUBCUTANEOUS
  Administered 2014-05-14: 5 [IU] via SUBCUTANEOUS
  Administered 2014-05-14: 3 [IU] via SUBCUTANEOUS
  Administered 2014-05-14: 5 [IU] via SUBCUTANEOUS
  Administered 2014-05-14: 3 [IU] via SUBCUTANEOUS

## 2014-05-13 MED ORDER — INSULIN ASPART 100 UNIT/ML ~~LOC~~ SOLN
0.0000 [IU] | SUBCUTANEOUS | Status: DC
  Administered 2014-05-13: 4 [IU] via SUBCUTANEOUS

## 2014-05-13 MED ORDER — DEXTROSE-NACL 5-0.45 % IV SOLN
INTRAVENOUS | Status: DC
  Administered 2014-05-13 (×2): via INTRAVENOUS
  Administered 2014-05-13 – 2014-05-14 (×2): 150 mL via INTRAVENOUS
  Administered 2014-05-14: 19:00:00 via INTRAVENOUS

## 2014-05-13 MED ORDER — INSULIN ASPART 100 UNIT/ML ~~LOC~~ SOLN
0.0000 [IU] | SUBCUTANEOUS | Status: DC
  Administered 2014-05-13: 3 [IU] via SUBCUTANEOUS
  Administered 2014-05-13: 5 [IU] via SUBCUTANEOUS

## 2014-05-13 MED ORDER — CHLORHEXIDINE GLUCONATE 0.12 % MT SOLN
OROMUCOSAL | Status: AC
  Filled 2014-05-13: qty 15

## 2014-05-13 MED ORDER — INSULIN GLARGINE 100 UNIT/ML ~~LOC~~ SOLN
14.0000 [IU] | Freq: Every day | SUBCUTANEOUS | Status: DC
  Filled 2014-05-13: qty 0.14

## 2014-05-13 MED ORDER — INSULIN GLARGINE 100 UNIT/ML ~~LOC~~ SOLN
12.0000 [IU] | Freq: Every day | SUBCUTANEOUS | Status: DC
  Administered 2014-05-13: 12 [IU] via SUBCUTANEOUS
  Filled 2014-05-13 (×2): qty 0.12

## 2014-05-13 NOTE — Progress Notes (Signed)
1 Day Post-Op Procedure(s) (LRB): RADICAL VULVECTOMY WITH COLOSTOMY (N/A) EXPLORATORY LAPAROTOMY, TOTAL ABDO MINAL HYSTERECTOMY AND BILATERAL SALPINGOOPHERECTOMY (N/A)  Subjective: Patient reports "discomfort"   Objective: Vital signs in last 24 hours: Temp:  [97.4 F (36.3 C)-98 F (36.7 C)] 98 F (36.7 C) (05/20 0800) Pulse Rate:  [59-109] 81 (05/20 0600) Resp:  [10-27] 16 (05/20 0733) BP: (72-127)/(33-100) 111/55 mmHg (05/20 0921) SpO2:  [93 %-100 %] 94 % (05/20 0733) Weight:  [68.4 kg (150 lb 12.7 oz)] 68.4 kg (150 lb 12.7 oz) (05/19 2015)    Intake/Output from previous day: 05/19 0701 - 05/20 0700 In: 6252.5 [I.V.:6252.5] Out: 8144 [Urine:585; Drains:160; Blood:450]  Physical Examination: General: alert Resp: clear to auscultation bilaterally Cardio: regular rate and rhythm, S1, S2 normal, no murmur, click, rub or gallop GI: soft, non-tender; bowel sounds normal; no masses,  no organomegaly and incision: clean, dry and intact Extremities: extremities normal, atraumatic, no cyanosis or edema Vaginal Bleeding: minimal Stoma pink Vaginal pack removed--minimal heme; a pack was placed Labs: WBC/Hgb/Hct/Plts:  12.8/9.3/29.6/170 (05/20 0325) BUN/Cr/glu/ALT/AST/amyl/lip:  23/1.25/--/--/--/--/-- (05/20 0325)   Assessment:  80 y.o. s/p Procedure(s): RADICAL VULVECTOMY WITH COLOSTOMY EXPLORATORY LAPAROTOMY, TOTAL ABDO MINAL HYSTERECTOMY AND BILATERAL SALPINGOOPHERECTOMY: stable Pain:  Pain is well-controlled on PCA  Heme: Anemia: c/w blood loss; stable  CV: Hypotension: B/Ps stable after replacement w/crystalloid Arrhythmia: h/o atrial fibrilation Hypertension: B/Ps in the low normal range. Current treatment:  diltiazem (Cardizem) and metoprolol (Lopressor, Toprol).  GI:  NPO  FEN: Borderline elevated potassium  Endo: Diabetes mellitus Type II, under inadequate control..  CBG: 200 -300.  Prophylaxis: pharmacologic prophylaxis (with any of the following: enoxaparin  (Lovenox) 30mg  SQ 2 hours prior to surgery then every day) and intermittent pneumatic compression boots.  Plan: Encourage ambulation Decrease IV fluid, remove potassium from IV fluid, adjust insulin coverage/basal insulin dosage NPO until flatus from stoma Will consider begin bridging to Coumadin to tomorrow Dressing changes BID Consults to Stoma Nurse/Care management/PT OK for transfer--> floor   LOS: 1 day    [name] 05/13/2014, 10:32 AM

## 2014-05-13 NOTE — Evaluation (Signed)
Physical Therapy Evaluation Patient Details Name: ALLYN BARTELSON MRN: [id number] DOB: 01-09-1926 Today's Date: 05/13/2014   History of Present Illness   admitted 05/12/14 with recurrent vulva cancer. S/P abdominal perineum resection,TAH, diverting colostomy on 05/12/14. Post op hypotension.  Clinical Impression  Pt tolerated mobilizing to recliner. Pt will benefit from PT while in acute care to address problems listed. Pt may benefit from post acute rehab at SNF  At DC.    Follow Up Recommendations SNF    Equipment Recommendations  None recommended by PT    Recommendations for Other Services       Precautions / Restrictions Precautions Precautions: Fall Precaution Comments: abdominal drain, colostomy, low BP      Mobility  Bed Mobility Overal bed mobility: Needs Assistance;+ 2 for safety/equipment Bed Mobility: Rolling;Sidelying to Sit Rolling: Max assist Sidelying to sit: Max assist;+2 for safety/equipment Supine to sit: +2 for safety/equipment     General bed mobility comments: cues to log roll, ped pad used to assist getting trunk into upright, extra time for pain onset.  Transfers Overall transfer level: Needs assistance Equipment used: Rolling walker (2 wheeled) Transfers: Sit to/from [facility] Sit to Stand: +2 physical assistance;+2 safety/equipment;Mod assist Stand pivot transfers: +2 physical assistance;+2 safety/equipment;Mod assist       General transfer comment: takes  small steps to get to recliner  Ambulation/Gait                Stairs            Wheelchair Mobility    Modified Rankin (Stroke Patients Only)       Balance Overall balance assessment: Needs assistance Sitting-balance support: Bilateral upper extremity supported;Feet supported Sitting balance-Leahy Scale: Fair     Standing balance support: Bilateral upper extremity supported;During functional activity Standing balance-Leahy Scale: Fair                                Pertinent Vitals/Pain HR 77,  BP after transfer 111/55, sats 98% on 2 l.    Home Living Family/patient expects to be discharged to:: Private residence Living Arrangements: Spouse/significant other Available Help at Discharge: Family Type of Home: House Home Access: Stairs to enter Entrance Stairs-Rails: None Technical brewer of Steps: 2 Home Layout: One level Home Equipment: Environmental consultant - 2 wheels Additional Comments: used RW when  went out of the house    Prior Function Level of Independence: Needs assistance  Pt reports her husband  Has been helping her with ADL's             Hand Dominance        Extremity/Trunk Assessment   Upper Extremity Assessment: Generalized weakness           Lower Extremity Assessment: Generalized weakness      Cervical / Trunk Assessment: Normal  Communication   Communication: No difficulties  Cognition Arousal/Alertness: Awake/alert Behavior During Therapy: WFL for tasks assessed/performed Overall Cognitive Status: Within Functional Limits for tasks assessed                      General Comments      Exercises        Assessment/Plan    PT Assessment Patient needs continued PT services  PT Diagnosis Difficulty walking;Acute pain;Generalized weakness   PT Problem List Decreased strength;Decreased range of motion;Decreased activity tolerance;Decreased mobility;Decreased knowledge of precautions;Decreased safety awareness;Decreased knowledge of use of DME;Pain  PT Treatment Interventions DME instruction;Gait training;Functional mobility training;Therapeutic activities;Therapeutic exercise;Patient/family education   PT Goals (Current goals can be found in the Care Plan section) Acute Rehab PT Goals Patient Stated Goal: I want to get netter. PT Goal Formulation: With patient Time For Goal Achievement: 05/27/14 Potential to Achieve Goals: Good    Frequency Min 3X/week   Barriers to  discharge        Co-evaluation               End of Session   Activity Tolerance: Patient limited by fatigue;Patient limited by pain Patient left: in chair;with call bell/phone within reach;with nursing/sitter in room Nurse Communication: Mobility status         Time: 6213-0865 PT Time Calculation (min): 17 min   Charges:   PT Evaluation $Initial PT Evaluation Tier I: 1 Procedure PT Treatments $Therapeutic Activity: 8-22 mins   PT G Codes:          Claretha Cooper 05/13/2014, 10:32 AM Tresa Endo PT [phone]

## 2014-05-13 NOTE — Progress Notes (Signed)
Clinical Social Work Department CLINICAL SOCIAL WORK PLACEMENT NOTE 05/13/2014  Patient:  [name], [name]  Account Number:  [id number] Admit date:  05/12/2014  Clinical Social Worker:  Werner Lean, LCSW  Date/time:  05/13/2014 03:44 PM  Clinical Social Work is seeking post-discharge placement for this patient at the following level of care:   SKILLED NURSING   (*CSW will update this form in Epic as items are completed)     Patient/family provided with [place] Department of Clinical Social Work's list of facilities offering this level of care within the geographic area requested by the patient (or if unable, by the patient's family).  05/13/2014  Patient/family informed of their freedom to choose among providers that offer the needed level of care, that participate in Medicare, Medicaid or managed care program needed by the patient, have an available bed and are willing to accept the patient.    Patient/family informed of [facility]' ownership interest in [facility], as well as of the fact that they are under no obligation to receive care at this facility.  PASARR submitted to EDS on 05/13/2014 PASARR number received from EDS on 05/13/2014  FL2 transmitted to all facilities in geographic area requested by pt/family on  05/13/2014 FL2 transmitted to all facilities within larger geographic area on   Patient informed that his/her managed care company has contracts with or will negotiate with  certain facilities, including the following:     Patient/family informed of bed offers received:   Patient chooses bed at  Physician recommends and patient chooses bed at    Patient to be transferred to  on   Patient to be transferred to facility by   The following physician request were entered in Epic:   Additional Comments:  Werner Lean LCSW [phone]

## 2014-05-13 NOTE — Progress Notes (Signed)
Clinical Social Work Department BRIEF PSYCHOSOCIAL ASSESSMENT 05/13/2014  Patient:  KETSIA, LINEBAUGH     Account Number:  [id number]     Admit date:  05/12/2014  Clinical Social Worker:  Lacie Scotts  Date/Time:  05/13/2014 02:58 PM  Referred by:  Physician  Date Referred:  05/13/2014 Referred for  SNF Placement   Other Referral:   Interview type:  Patient Other interview type:    PSYCHOSOCIAL DATA Living Status:  HUSBAND Admitted from facility:   Level of care:   Primary support name:  Joneen Caraway Primary support relationship to patient:  SPOUSE Degree of support available:   supportive    CURRENT CONCERNS Current Concerns  Post-Acute Placement   Other Concerns:    SOCIAL WORK ASSESSMENT / PLAN Pt is an 80 yr old female living at home with spouse prior to hospitalization. CSW meet with pt/spouse to assist with d/c planning. MD / PT have recommended [facility] Re[facility] following hospital d/c. Pt / spouse are in agreement with this plan. Pt has been to [facility] in the past and would like to return, if possible. CSW will contact SNF with placement request. [facility] search will be initiated , if necessary. CSW will continue to follow to assist with d/c planning to SNF.   Assessment/plan status:  Psychosocial Support/Ongoing Assessment of Needs Other assessment/ plan:   Information/referral to community resources:   Insurance coverage for SNF reviewed.    PATIENT'S/FAMILY'S RESPONSE TO PLAN OF CARE: Pt is happy surgery is over. Her mood is bright.  Pt had a good experience at [facility] and is hopeful that a bed will be available for her at d/c.   Werner Lean LCSW [phone]

## 2014-05-13 NOTE — Progress Notes (Signed)
Inpatient Diabetes Program Recommendations  AACE/ADA: New Consensus Statement on Inpatient Glycemic Control (2013)  Target Ranges:  Prepandial:   less than 140 mg/dL      Peak postprandial:   less than 180 mg/dL (1-2 hours)      Critically ill patients:  140 - 180 mg/dL   Reason for Visit: Hyperglycemia  Diabetes history: DM2 Outpatient Diabetes medications: Humalog 14-14-22 and Levemir 22 in am and 16 QHS Current orders for Inpatient glycemic control: Novolog resistant Q4H and Lantus 14 units QHS  Results for ZEMIRAH, KRASINSKI (MRN [id number]) as of 05/13/2014 12:13  Ref. Range 05/12/2014 21:26 05/13/2014 00:34 05/13/2014 04:22 05/13/2014 07:48 05/13/2014 11:03  Glucose-Capillary Latest Range: 70-99 mg/dL 256 (H) 270 (H) 200 (H) 216 (H) 175 (H)  Results for YENIFER, SACCENTE (MRN [id number]) as of 05/13/2014 12:13  Ref. Range 05/04/2014 10:37  Hemoglobin A1C Latest Range: 4.6-6.5 % 6.9 (H)   Hyperglycemia - has not received any basal insulin.  Recommend at least half of home dose basal insulin. Consider Levemir 11 units in am and 8 units QHS.   Will continue to follow. Thank you. Lorenda Peck, RD, LDN, CDE Inpatient Diabetes Coordinator [phone]

## 2014-05-13 NOTE — Progress Notes (Signed)
CARE MANAGEMENT NOTE 05/13/2014  Patient:  [name], [name]   Account Number:  [id number]  Date Initiated:  05/13/2014  Documentation initiated by:  DAVIS,RHONDA  Subjective/Objective Assessment:   pt with hypotensive episode post op tah,bso and colostomy     Action/Plan:   md request for patient to be transfered to rehab at dcd   Anticipated DC Date:  05/16/2014   Anticipated DC Plan:  [place]  In-house referral  Clinical Social Worker      DC Planning Services  NA      [facility] Choice  NA   Choice offered to / List presented to:  NA   DME arranged  NA      DME agency  NA     [place] arranged  NA      [place] agency  NA   Status of service:  In process, will continue to follow Medicare Important Message given?  NA - LOS <3 / Initial given by admissions (If response is "NO", the following Medicare IM given date fields will be blank) Date Medicare IM given:   Date Additional Medicare IM given:    Discharge Disposition:    Per UR Regulation:  Reviewed for med. necessity/level of care/duration of stay  If discussed at [place] of Stay Meetings, dates discussed:    Comments:  [id number]/Rhonda Eldridge Dace, [place], [place] [phone] Chart Reviewed for discharge and hospital needs. Discharge needs at time of review: None present will follow for needs. Review of patient progress due on [id number].

## 2014-05-14 ENCOUNTER — Encounter (HOSPITAL_COMMUNITY): Payer: Self-pay | Admitting: Gynecology

## 2014-05-14 LAB — GLUCOSE, CAPILLARY
GLUCOSE-CAPILLARY: 105 mg/dL — AB (ref 70–99)
GLUCOSE-CAPILLARY: 218 mg/dL — AB (ref 70–99)
Glucose-Capillary: 174 mg/dL — ABNORMAL HIGH (ref 70–99)
Glucose-Capillary: 174 mg/dL — ABNORMAL HIGH (ref 70–99)
Glucose-Capillary: 193 mg/dL — ABNORMAL HIGH (ref 70–99)
Glucose-Capillary: 204 mg/dL — ABNORMAL HIGH (ref 70–99)
Glucose-Capillary: 213 mg/dL — ABNORMAL HIGH (ref 70–99)

## 2014-05-14 LAB — CBC
HEMATOCRIT: 26.6 % — AB (ref 36.0–46.0)
Hemoglobin: 8.4 g/dL — ABNORMAL LOW (ref 12.0–15.0)
MCH: 31.1 pg (ref 26.0–34.0)
MCHC: 31.6 g/dL (ref 30.0–36.0)
MCV: 98.5 fL (ref 78.0–100.0)
Platelets: 149 10*3/uL — ABNORMAL LOW (ref 150–400)
RBC: 2.7 MIL/uL — ABNORMAL LOW (ref 3.87–5.11)
RDW: 15.5 % (ref 11.5–15.5)
WBC: 15.1 10*3/uL — ABNORMAL HIGH (ref 4.0–10.5)

## 2014-05-14 LAB — BASIC METABOLIC PANEL
BUN: 20 mg/dL (ref 6–23)
CALCIUM: 8.1 mg/dL — AB (ref 8.4–10.5)
CO2: 20 mEq/L (ref 19–32)
CREATININE: 1.36 mg/dL — AB (ref 0.50–1.10)
Chloride: 100 mEq/L (ref 96–112)
GFR calc Af Amer: 40 mL/min — ABNORMAL LOW (ref >90)
GFR, EST NON AFRICAN AMERICAN: 34 mL/min — AB (ref >90)
Glucose, Bld: 215 mg/dL — ABNORMAL HIGH (ref 70–99)
Potassium: 4.6 mEq/L (ref 3.7–5.3)
Sodium: 132 mEq/L — ABNORMAL LOW (ref 137–147)

## 2014-05-14 LAB — PREPARE RBC (CROSSMATCH)

## 2014-05-14 MED ORDER — TRAMADOL HCL 50 MG PO TABS
50.0000 mg | ORAL_TABLET | Freq: Four times a day (QID) | ORAL | Status: DC
  Administered 2014-05-14: 50 mg via ORAL
  Filled 2014-05-14: qty 1

## 2014-05-14 MED ORDER — INSULIN ASPART 100 UNIT/ML ~~LOC~~ SOLN
0.0000 [IU] | SUBCUTANEOUS | Status: DC
  Administered 2014-05-15: 7 [IU] via SUBCUTANEOUS
  Administered 2014-05-15: 11 [IU] via SUBCUTANEOUS

## 2014-05-14 MED ORDER — TRAMADOL HCL 50 MG PO TABS
50.0000 mg | ORAL_TABLET | Freq: Four times a day (QID) | ORAL | Status: DC
Start: 2014-05-14 — End: 2014-05-15
  Filled 2014-05-14 (×2): qty 1

## 2014-05-14 MED ORDER — FUROSEMIDE 10 MG/ML IJ SOLN
20.0000 mg | Freq: Once | INTRAMUSCULAR | Status: AC
  Administered 2014-05-14: 20 mg via INTRAVENOUS
  Filled 2014-05-14: qty 2

## 2014-05-14 MED ORDER — INSULIN GLARGINE 100 UNIT/ML ~~LOC~~ SOLN
14.0000 [IU] | Freq: Every day | SUBCUTANEOUS | Status: DC
  Administered 2014-05-14: 14 [IU] via SUBCUTANEOUS
  Filled 2014-05-14 (×2): qty 0.14

## 2014-05-14 NOTE — Progress Notes (Signed)
CSW assisting with d/c planning. Pt has accepted SNF bed offer from [facility]. There will be an opening on Monday ( 5/25 ) if pt is stable for d/c. CSW will continue to follow to assist with d/c planning needs.  Werner Lean LCSW [phone]

## 2014-05-14 NOTE — Clinical Documentation Improvement (Signed)
05/14/14 Noted /05/14/14 Prog note "Anemia: asymptomatic.Marland KitchenMarland KitchenTransfuse 2 units PRBC..." Labs:  05/12/14 @ 1820 05/13/14 @02338   05/13/14 @0325  05/14/14 @ 0535 H/H:   10.0/ 31.2      9.2/ 31.1      9.3/ 29.6     8.4/ 26.6  For accurate Dx specificity & severity can noted "anemia" be further specified w/ type for cond being eval'd, mon'd & tx'd. Thank you  Possible Clinical Conditions?   Expected Acute Blood Loss Anemia  Acute Blood Loss Anemia  Acute on chronic blood loss anemia  Chronic blood loss anemia  Precipitous drop in Hematocrit  Other Condition  Cannot Clinically Determine   Supporting Information: Risk Factors:05/12/14: PROCEDURE: Abdominal perineal resection, total abdominal hysterectomy bilateral salpingo-oophorectomy, descending and colostomy, omental J. Flap. Per Op note "ESTIMATED BLOOD LOSS: 500 mL" Signs and Symptoms: See above note Diagnostics: See above note Tx: See above note  Thank You, Ermelinda Das, RN, BSN, CCDS Certified Clinical Documentation Specialist Pager: [place] Management

## 2014-05-14 NOTE — Progress Notes (Signed)
Patient ID: [name], female   DOB: 11/25/1926, 80 y.o.   MRN: [id number] 2 Days Post-Op Procedure(s) (LRB): RADICAL VULVECTOMY WITH COLOSTOMY (N/A) EXPLORATORY LAPAROTOMY, TOTAL ABDO MINAL HYSTERECTOMY AND BILATERAL SALPINGOOPHERECTOMY (N/A)  Subjective: Patient reports "discomfort" /flatus from stoma  Objective: Vital signs in last 24 hours: Temp:  [97.8 F (36.6 C)-98.6 F (37 C)] 98 F (36.7 C) (05/21 0605) Pulse Rate:  [73-96] 77 (05/21 0605) Resp:  [14-22] 15 (05/21 0828) BP: (82-128)/(47-72) 97/64 mmHg (05/21 0605) SpO2:  [94 %-100 %] 98 % (05/21 0605)    Intake/Output from previous day: 05/20 0701 - 05/21 0700 In: 3370.4 [I.V.:3370.4] Out: 1182 [Urine:1150; Drains:22; Stool:10]  CBG (last 3)   Recent Labs  05/14/14 0007 05/14/14 0354 05/14/14 0741  GLUCAP 174* 204* 193*     Physical Examination: General: alert Resp: clear to auscultation bilaterally Cardio: regular rate and rhythm, S1, S2 normal, no murmur, click, rub or gallop GI: soft, non-tender; bowel sounds normal; no masses,  no organomegaly and incision: clean, dry and intact Extremities: extremities normal, atraumatic, no cyanosis or edema Vaginal Bleeding: minimal Stoma pink--blood tinged, liquid stool, scant air Vaginal pack removed--minimal heme; a pack was placed Labs: WBC/Hgb/Hct/Plts:  15.1/8.4/26.6/149 (05/21 0535) BUN/Cr/glu/ALT/AST/amyl/lip:  20/1.36/--/--/--/--/-- (05/21 0535)   Assessment:  80 y.o. s/p Procedure(s): RADICAL VULVECTOMY WITH COLOSTOMY EXPLORATORY LAPAROTOMY, TOTAL ABDO MINAL HYSTERECTOMY AND BILATERAL SALPINGOOPHERECTOMY: stable Pain:  Pain is well-controlled on PCA  Heme: Anemia: asymptomatic  CV: Arrhythmia: h/o atrial fibrilation Hypertension: B/Ps in the low normal range. Current treatment:  diltiazem (Cardizem) and metoprolol (Lopressor, Toprol).  GI:  NPO  FEN: Electrolytes in range  Endo: Diabetes mellitus Type II, under adequate control..  CBG:  <250.   Neuro: Minimal sundowning  Prophylaxis: pharmacologic prophylaxis (with any of the following: enoxaparin (Lovenox) 30mg  SQ 2 hours prior to surgery then every day) and intermittent pneumatic compression boots.  Plan: Encourage ambulation Decrease IV fluid;adjust basal insulin dosage Liquid diet Transfuse 2 units PRBC--pt aware of risks/benefits Will  begin bridging to Coumadin to tomorrow Continue dressing changes BID    LOS: 2 days    [name] 05/14/2014, 10:01 AM

## 2014-05-14 NOTE — Consult Note (Signed)
WOC ostomy consult note Stoma type/location: LLQ Colostomy Stomal assessment/size: 1 abd 1/4 inches round, moist, edematous, budded.  Not functioning at this time.  Gulley surrounding stoma, deeper on lateral edge (2-5 o'clock) Peristomal assessment: intact, celar Treatment options for stomal/peristomal skin: skin barrier ring used to accommodate gulley Output serosanguinous drainage in pouch Ostomy pouching: 2pc. 2 and 1/4 inch pouch with skin barrier ring placed today, but I will observe tomorrow and consider placement of a 1-piece pouch with convexity in the morning with a skin barrier ring. Education provided: Patient understands that the ostomy surgery went well; she quickly viewed her stoma while I was sizing and applying new pouch.  Educational booklet and teaching sheet provided, but patient asked me to place it on the nightstand as she "did not want to do that today". I will revisit tomorrow to assess effectiveness of 2-piece pouch.  Two 1-piece pouches with convexity left in room. [place] nursing team will not follow, but will remain available to this patient, the nursing and medical team.  Please re-consult if needed. Thanks, Maudie Flakes, MSN, RN, [place], [place], [place] [phone])

## 2014-05-15 LAB — CBC
HEMATOCRIT: 37 % (ref 36.0–46.0)
Hemoglobin: 12.6 g/dL (ref 12.0–15.0)
MCH: 30.2 pg (ref 26.0–34.0)
MCHC: 34.1 g/dL (ref 30.0–36.0)
MCV: 88.7 fL (ref 78.0–100.0)
PLATELETS: 159 10*3/uL (ref 150–400)
RBC: 4.17 MIL/uL (ref 3.87–5.11)
RDW: 18.5 % — AB (ref 11.5–15.5)
WBC: 16.4 10*3/uL — AB (ref 4.0–10.5)

## 2014-05-15 LAB — TYPE AND SCREEN
ABO/RH(D): A NEG
Antibody Screen: NEGATIVE
UNIT DIVISION: 0
Unit division: 0

## 2014-05-15 LAB — BASIC METABOLIC PANEL
BUN: 18 mg/dL (ref 6–23)
CHLORIDE: 97 meq/L (ref 96–112)
CO2: 18 meq/L — AB (ref 19–32)
CREATININE: 1.14 mg/dL — AB (ref 0.50–1.10)
Calcium: 8.5 mg/dL (ref 8.4–10.5)
GFR calc Af Amer: 49 mL/min — ABNORMAL LOW (ref >90)
GFR calc non Af Amer: 43 mL/min — ABNORMAL LOW (ref >90)
Glucose, Bld: 242 mg/dL — ABNORMAL HIGH (ref 70–99)
POTASSIUM: 3.8 meq/L (ref 3.7–5.3)
Sodium: 129 mEq/L — ABNORMAL LOW (ref 137–147)

## 2014-05-15 LAB — GLUCOSE, CAPILLARY
GLUCOSE-CAPILLARY: 177 mg/dL — AB (ref 70–99)
GLUCOSE-CAPILLARY: 130 mg/dL — AB (ref 70–99)
Glucose-Capillary: 219 mg/dL — ABNORMAL HIGH (ref 70–99)
Glucose-Capillary: 220 mg/dL — ABNORMAL HIGH (ref 70–99)
Glucose-Capillary: 153 mg/dL — ABNORMAL HIGH (ref 70–99)

## 2014-05-15 MED ORDER — TRAMADOL HCL 50 MG PO TABS
50.0000 mg | ORAL_TABLET | Freq: Two times a day (BID) | ORAL | Status: DC
  Administered 2014-05-15: 50 mg via ORAL
  Filled 2014-05-15: qty 1

## 2014-05-15 MED ORDER — INSULIN ASPART 100 UNIT/ML ~~LOC~~ SOLN
0.0000 [IU] | SUBCUTANEOUS | Status: DC
  Administered 2014-05-15: 7 [IU] via SUBCUTANEOUS
  Administered 2014-05-15: 4 [IU] via SUBCUTANEOUS
  Administered 2014-05-15: 3 [IU] via SUBCUTANEOUS
  Administered 2014-05-15 – 2014-05-16 (×4): 4 [IU] via SUBCUTANEOUS
  Administered 2014-05-16: 3 [IU] via SUBCUTANEOUS

## 2014-05-15 MED ORDER — INSULIN GLARGINE 100 UNIT/ML ~~LOC~~ SOLN
20.0000 [IU] | Freq: Every day | SUBCUTANEOUS | Status: DC
  Administered 2014-05-15: 20 [IU] via SUBCUTANEOUS
  Filled 2014-05-15: qty 0.2

## 2014-05-15 MED ORDER — ENOXAPARIN SODIUM 80 MG/0.8ML ~~LOC~~ SOLN
1.0000 mg/kg | Freq: Two times a day (BID) | SUBCUTANEOUS | Status: DC
  Administered 2014-05-15 (×2): 70 mg via SUBCUTANEOUS
  Filled 2014-05-15 (×4): qty 0.8

## 2014-05-15 MED ORDER — LACTULOSE 10 GM/15ML PO SOLN
10.0000 g | Freq: Three times a day (TID) | ORAL | Status: DC
  Filled 2014-05-15 (×4): qty 15

## 2014-05-15 MED ORDER — ONDANSETRON HCL 4 MG/2ML IJ SOLN
4.0000 mg | Freq: Four times a day (QID) | INTRAMUSCULAR | Status: DC | PRN
  Administered 2014-05-15: 4 mg via INTRAVENOUS
  Filled 2014-05-15: qty 2

## 2014-05-15 MED ORDER — DEXTROSE-NACL 5-0.9 % IV SOLN
INTRAVENOUS | Status: DC
  Administered 2014-05-15 (×2): via INTRAVENOUS
  Administered 2014-05-16: 100 mL via INTRAVENOUS

## 2014-05-15 MED ORDER — WARFARIN SODIUM 5 MG PO TABS
5.0000 mg | ORAL_TABLET | Freq: Once | ORAL | Status: AC
  Administered 2014-05-15: 5 mg via ORAL
  Filled 2014-05-15: qty 1

## 2014-05-15 MED ORDER — ACETAMINOPHEN 500 MG PO TABS
1000.0000 mg | ORAL_TABLET | Freq: Four times a day (QID) | ORAL | Status: DC
  Administered 2014-05-15 (×2): 1000 mg via ORAL
  Filled 2014-05-15 (×6): qty 2

## 2014-05-15 MED ORDER — WARFARIN - PHARMACIST DOSING INPATIENT: Freq: Every day | Status: DC

## 2014-05-15 NOTE — Progress Notes (Signed)
Patient ID: [name], female   DOB: 07-24-1926, 80 y.o.   MRN: [id number] 3 Days Post-Op Procedure(s) (LRB): RADICAL VULVECTOMY WITH COLOSTOMY (N/A) EXPLORATORY LAPAROTOMY, TOTAL ABDO MINAL HYSTERECTOMY AND BILATERAL SALPINGOOPHERECTOMY (N/A)  Subjective: Patient c/o N/V  Objective: Vital signs in last 24 hours: Temp:  [97.4 F (36.3 C)-98.3 F (36.8 C)] 97.7 F (36.5 C) (05/22 0556) Pulse Rate:  [65-102] 81 (05/22 0556) Resp:  [11-16] 16 (05/22 0556) BP: (90-151)/(57-101) 150/89 mmHg (05/22 0556) SpO2:  [93 %-100 %] 97 % (05/22 0556)    Intake/Output from previous day: 05/21 0701 - 05/22 0700 In: 3807.9 [P.O.:240; I.V.:2408.3; Blood:1159.6] Out: 2080 [Urine:2050; Drains:30]  CBG (last 3)   Recent Labs  05/14/14 2334 05/15/14 0408 05/15/14 0738  GLUCAP 213* 219* 220*     Physical Examination: General: alert Resp: clear to auscultation bilaterally Cardio: regular rate and rhythm, S1, S2 normal, no murmur, click, rub or gallop GI: soft, non-tender; bowel sounds normal; no masses,  no organomegaly and incision: clean, dry and intact Extremities: extremities normal, atraumatic, no cyanosis or edema Vaginal Bleeding: minimal Stoma pink--blood tinged, liquid stool, scant air Vaginal pack removed--minimal heme; a pack was placed Skin protector on back/no ulcerations Labs: WBC/Hgb/Hct/Plts:  16.4/12.6/37.0/159 (05/22 0441) BUN/Cr/glu/ALT/AST/amyl/lip:  18/1.14/--/--/--/--/-- (05/22 0441)   Assessment:  80 y.o. s/p Procedure(s): RADICAL VULVECTOMY WITH COLOSTOMY EXPLORATORY LAPAROTOMY, TOTAL ABDO MINAL HYSTERECTOMY AND BILATERAL SALPINGOOPHERECTOMY: stable Pain:  Pain is well-controlled on oral pain medicaions  Heme: Anemia: Increased hgb s/p transfusion  CV: Arrhythmia: h/o atrial fibrilation Hypertension: B/Ps in the low normal range. Current treatment:  diltiazem (Cardizem) and metoprolol (Lopressor, Toprol).  GI:  NPO  FEN: Electrolytes--mild  hypokalemia  Endo: Diabetes mellitus Type II, under adequate control..  CBG: <250.   Neuro: No sundowning  Prophylaxis: full anticoagulation dose of lovenox--start bridging to coumadin and intermittent pneumatic compression boots.  Plan: Encourage ambulation Decrease IV fluid;adjust basal insulin dosage/coverage insulin NPO Change IV fluid-->D5NS Continue dressing changes BID    LOS: 3 days    [name] 05/15/2014, 8:42 AM

## 2014-05-15 NOTE — Progress Notes (Signed)
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 S/P abdominal perineum resection,TAH, diverting colostomy on 05/12/14. Post op hypotension.    PT Comments    Pt OOB in rocking chair with chair alarm pleasant but confused.  Pt wanted to know what lady was sleeping in her bed.  Spouse arrived and pt stated "I hate you". Pt thought she was in a hotel room.  Oriented to current situation with out increasing her agitation.  Amb in hallway twice to equal 150 feet.  Pt demon generalized weakness and will need ST Rehab at SNF.   Follow Up Recommendations  SNF     Equipment Recommendations       Recommendations for Other Services       Precautions / Restrictions Precautions Precautions: Fall Precaution Comments: abdominal drain, colostomy, low BP Restrictions Weight Bearing Restrictions: No    Mobility  Bed Mobility               General bed mobility comments: Pt OOB in rocking chair  Transfers Overall transfer level: Needs assistance Equipment used: Rolling walker (2 wheeled) Transfers: Sit to/from Stand Sit to Stand: +2 safety/equipment;Max assist         General transfer comment: 50% VC's on proper tech and hand placement  Ambulation/Gait Ambulation/Gait assistance: +2 safety/equipment;Mod assist;Min assist Ambulation Distance (Feet): 150 Feet (75 feet x 2 one sitting rest break) Assistive device: Rolling walker (2 wheeled) Gait Pattern/deviations: Step-through pattern;Decreased step length - left;Trunk flexed Gait velocity: decreased   General Gait Details: amb on RA sats avg 96%  HR 122.  75% VC's for direction.  Spouse assisted by following with recliner.     Stairs            Wheelchair Mobility    Modified Rankin (Stroke Patients Only)       Balance                                     Cognition                            Exercises      General Comments        Pertinent Vitals/Pain Co ABD pain but unable to rate.     Home Living                      Prior Function            PT Goals (current goals can now be found in the care plan section) Progress towards PT goals: Progressing toward goals    Frequency  Min 3X/week    PT Plan      Co-evaluation             End of Session Equipment Utilized During Treatment: Gait belt Activity Tolerance: Patient limited by fatigue;Patient limited by pain Patient left: in chair;with call bell/phone within reach;with nursing/sitter in room     Time: 1017-5102 PT Time Calculation (min): 30 min  Charges:  $Gait Training: 23-37 mins                    G Codes:      Rica Koyanagi  PTA [facility]  Acute  Rehab Pager  [phone]  

## 2014-05-15 NOTE — Progress Notes (Signed)
ANTICOAGULATION CONSULT NOTE - Initial Consult  Pharmacy Consult for Coumadin Indication: atrial fibrillation  Allergies  Allergen Reactions  . Tenex [Guanfacine Hcl] Other (See Comments)    hypotension  . Crestor [Rosuvastatin] Other (See Comments)    Myalgia    Patient Measurements: Height: 5\' 4"  (162.6 cm) Weight: 150 lb 12.7 oz (68.4 kg) IBW/kg (Calculated) : 54.7  Vital Signs: Temp: 97.7 F (36.5 C) (05/22 0556) Temp src: Oral (05/22 0556) BP: 150/89 mmHg (05/22 0556) Pulse Rate: 81 (05/22 0556)  Labs:  Recent Labs  05/12/14 1000  05/13/14 0325 05/14/14 0535 05/15/14 0441  HGB  --   < > 9.3* 8.4* 12.6  HCT  --   < > 29.6* 26.6* 37.0  PLT  --   < > 170 149* 159  LABPROT 16.3*  --   --   --   --   INR 1.34  --   --   --   --   CREATININE  --   < > 1.25* 1.36* 1.14*  < > = values in this interval not displayed.  Estimated Creatinine Clearance: 34.3 ml/min (by C-G formula based on Cr of 1.14).   Medical History: Past Medical History  Diagnosis Date  . Hypertension   . Hyperlipidemia   . Obesity   . Hypothyroidism   . Vulvar cancer, carcinoma 11/03/2011  . Atrial fibrillation   . Cancer 2007    vulva-invasive well diff squamous cell ca  . CVA (cerebral infarction)   . Dyslipidemia   . History of radiation therapy 10/20/2013-12/01/2013    60 gray to perineum area and lower pelvis  . Diabetes mellitus 05-04-14    "Diabetic coma" 1 week ago -tx. by EMT At residence until pt. feeling improved and stable.  . Shortness of breath     occ. with coughing up phelgm usually mornings  . 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 Abnormal voice 05-04-14    "raspy voice" with inspiratory effort-Dr. Lissa Hoard aware 05-04-14  . Stroke 05/2000    right brain CVA pre H&P 2001-    Medications:  Prescriptions prior to admission  Medication Sig Dispense Refill  . atorvastatin (LIPITOR) 20 MG tablet Take 20 mg by mouth daily.      . calcitRIOL (ROCALTROL)  0.25 MCG capsule Take 0.25 mcg by mouth every Monday, Wednesday, and Friday.      . diltiazem (DILACOR XR) 180 MG 24 hr capsule Take 180 mg by mouth every morning.      . enoxaparin (LOVENOX) 40 MG/0.4ML injection Inject 40 mg into the skin daily. Pt unsure of dose      . furosemide (LASIX) 20 MG tablet Take 20 mg by mouth every morning.      . insulin detemir (LEVEMIR) 100 UNIT/ML injection Inject 16-22 Units into the skin 2 (two) times daily. 22 in am and 16  in pm      . insulin lispro (HUMALOG) 100 UNIT/ML injection Inject 14-22 Units into the skin 3 (three) times daily before meals. 14 in the morning 14 at lunch and 22 at supper      . levothyroxine (SYNTHROID, LEVOTHROID) 100 MCG tablet Take 100 mcg by mouth as directed. Every day except sunday      . metoprolol (LOPRESSOR) 50 MG tablet Take 50 mg by mouth 2 (two) times daily.      [name] oxyCODONE-acetaminophen (PERCOCET/ROXICET) 5-325 MG per tablet Take 1 tablet by mouth every 4 (four) hours  as needed for severe pain.      . potassium chloride (K-DUR,KLOR-CON) 10 MEQ tablet Take 10 mEq by mouth daily.      [name] warfarin (COUMADIN) 5 MG tablet Take 2.5-5 mg by mouth daily. Last dose to be 05-06-14, then Lovenox 05-07-14,05-08-14.       Scheduled:  . antiseptic oral rinse  15 mL Mouth Rinse q12n4p  . chlorhexidine  15 mL Mouth Rinse BID  . diltiazem  180 mg Oral q morning - 10a  . enoxaparin (LOVENOX) injection  1 mg/kg Subcutaneous Q12H  . furosemide  20 mg Oral q morning - 10a  . insulin aspart  0-20 Units Subcutaneous 6 times per day  . insulin glargine  14 Units Subcutaneous QHS  . levothyroxine  100 mcg Oral Once per day on Mon Tue Wed Thu Fri Sat  . metoprolol  50 mg Oral BID  . potassium chloride  10 mEq Oral Daily  . traMADol  50 mg Oral Q6H    Assessment: 80yo F recently diagnosed with vulvar cancer, admitted 5/19 for vulvectomy, TAH/BSO, colostomy, and omental flap. Hx of A.fib, on Coumadin PTA(2.5mg  daily except 5mg  on TThSat).  Prophylactic Lovenox was used post-op. On 5/22(POD#3) increasing to full-anticoagulation with Lovenox as a bridge back to Coumadin.   Last INR was 1.34 on 5/19.  Post-op anemia improving.   SCr was elevated but is improving. CrCl >30 now.   Goal of Therapy:  INR 2-3 Monitor platelets by anticoagulation protocol: Yes   Plan:   Give Coumadin 5mg  today.  Agree with Lovenox 1mg /kg q12h until INR 2 or greater.  Check PT/INR daily.  Romeo Rabon, PharmD, pager [phone]. 05/15/2014,8:50 AM.

## 2014-05-15 NOTE — Consult Note (Addendum)
WOC ostomy follow up Stoma type/location: LLQ Colostomy Stomal assessment/size: unchanged from yesterday, 1 and 1/4 inches round Peristomal assessment: not seen today-pouch is intact Treatment options for stomal/peristomal skin: none indicated Output soft semi-formed brown stool Ostomy pouching: 2pc. Pouch applied yesterday is intact Education provided: Patient has just moved from chair to bed and is exhausted. Pleasant, but confused. I reminded her that I was coming in today to assess whether or not the pouch we applied yesterday was functioning well. The two piece option with a skin barrier ring may be preferred to the convex pouching system, but I am not confident that a convex pouching system wouldn't be better.  Will reassess over weekend and see Sunday. Enrolled patient in [place] Start Discharge program: Yes [place] nursing team will follow, and will remain available to this patient, the nursing, surgical and medical teams.  Please re-consult if needed. Thanks, Maudie Flakes, MSN, RN, [place], [place], [place] [phone])

## 2014-05-16 ENCOUNTER — Inpatient Hospital Stay (HOSPITAL_COMMUNITY): Payer: Medicare Other

## 2014-05-16 ENCOUNTER — Encounter (HOSPITAL_COMMUNITY): Payer: Self-pay | Admitting: Gastroenterology

## 2014-05-16 DIAGNOSIS — K922 Gastrointestinal hemorrhage, unspecified: Secondary | ICD-10-CM | POA: Diagnosis not present

## 2014-05-16 DIAGNOSIS — D689 Coagulation defect, unspecified: Secondary | ICD-10-CM | POA: Diagnosis not present

## 2014-05-16 DIAGNOSIS — C519 Malignant neoplasm of vulva, unspecified: Secondary | ICD-10-CM | POA: Diagnosis not present

## 2014-05-16 DIAGNOSIS — I959 Hypotension, unspecified: Secondary | ICD-10-CM | POA: Diagnosis not present

## 2014-05-16 DIAGNOSIS — Z7901 Long term (current) use of anticoagulants: Secondary | ICD-10-CM

## 2014-05-16 HISTORY — DX: Gastrointestinal hemorrhage, unspecified: K92.2

## 2014-05-16 LAB — PROTIME-INR
INR: 2.58 — AB (ref 0.00–1.49)
Prothrombin Time: 26.8 seconds — ABNORMAL HIGH (ref 11.6–15.2)

## 2014-05-16 LAB — HEPATIC FUNCTION PANEL
ALBUMIN: 1.9 g/dL — AB (ref 3.5–5.2)
ALT: 10 U/L (ref 0–35)
AST: 15 U/L (ref 0–37)
Alkaline Phosphatase: 61 U/L (ref 39–117)
BILIRUBIN TOTAL: 0.8 mg/dL (ref 0.3–1.2)
Bilirubin, Direct: 0.3 mg/dL (ref 0.0–0.3)
Indirect Bilirubin: 0.5 mg/dL (ref 0.3–0.9)
TOTAL PROTEIN: 4.9 g/dL — AB (ref 6.0–8.3)

## 2014-05-16 LAB — CBC
HCT: 30.1 % — ABNORMAL LOW (ref 36.0–46.0)
HCT: 24.8 % — ABNORMAL LOW (ref 36.0–46.0)
HEMATOCRIT: 23.5 % — AB (ref 36.0–46.0)
HEMOGLOBIN: 8.4 g/dL — AB (ref 12.0–15.0)
HEMOGLOBIN: 8 g/dL — AB (ref 12.0–15.0)
Hemoglobin: 10.1 g/dL — ABNORMAL LOW (ref 12.0–15.0)
MCH: 30.4 pg (ref 26.0–34.0)
MCH: 30.5 pg (ref 26.0–34.0)
MCH: 31 pg (ref 26.0–34.0)
MCHC: 33.6 g/dL (ref 30.0–36.0)
MCHC: 33.9 g/dL (ref 30.0–36.0)
MCHC: 34 g/dL (ref 30.0–36.0)
MCV: 90.7 fL (ref 78.0–100.0)
MCV: 90.2 fL (ref 78.0–100.0)
MCV: 91.1 fL (ref 78.0–100.0)
PLATELETS: 189 10*3/uL (ref 150–400)
Platelets: 161 10*3/uL (ref 150–400)
Platelets: 164 10*3/uL (ref 150–400)
RBC: 3.32 MIL/uL — AB (ref 3.87–5.11)
RBC: 2.75 MIL/uL — AB (ref 3.87–5.11)
RBC: 2.58 MIL/uL — ABNORMAL LOW (ref 3.87–5.11)
RDW: 18.2 % — ABNORMAL HIGH (ref 11.5–15.5)
RDW: 18 % — ABNORMAL HIGH (ref 11.5–15.5)
RDW: 17.8 % — ABNORMAL HIGH (ref 11.5–15.5)
WBC: 17.5 10*3/uL — AB (ref 4.0–10.5)
WBC: 11.4 10*3/uL — ABNORMAL HIGH (ref 4.0–10.5)
WBC: 11.9 10*3/uL — AB (ref 4.0–10.5)

## 2014-05-16 LAB — URINALYSIS, ROUTINE W REFLEX MICROSCOPIC
Bilirubin Urine: NEGATIVE
Glucose, UA: NEGATIVE mg/dL
HGB URINE DIPSTICK: NEGATIVE
Ketones, ur: NEGATIVE mg/dL
Leukocytes, UA: NEGATIVE
Nitrite: NEGATIVE
PH: 6 (ref 5.0–8.0)
Protein, ur: NEGATIVE mg/dL
SPECIFIC GRAVITY, URINE: 1.02 (ref 1.005–1.030)
UROBILINOGEN UA: 1 mg/dL (ref 0.0–1.0)

## 2014-05-16 LAB — GLUCOSE, CAPILLARY
GLUCOSE-CAPILLARY: 112 mg/dL — AB (ref 70–99)
GLUCOSE-CAPILLARY: 144 mg/dL — AB (ref 70–99)
GLUCOSE-CAPILLARY: 145 mg/dL — AB (ref 70–99)
GLUCOSE-CAPILLARY: 159 mg/dL — AB (ref 70–99)
GLUCOSE-CAPILLARY: 46 mg/dL — AB (ref 70–99)
Glucose-Capillary: 263 mg/dL — ABNORMAL HIGH (ref 70–99)
Glucose-Capillary: 179 mg/dL — ABNORMAL HIGH (ref 70–99)
Glucose-Capillary: 188 mg/dL — ABNORMAL HIGH (ref 70–99)

## 2014-05-16 LAB — BASIC METABOLIC PANEL
BUN: 41 mg/dL — ABNORMAL HIGH (ref 6–23)
BUN: 35 mg/dL — ABNORMAL HIGH (ref 6–23)
CALCIUM: 8.2 mg/dL — AB (ref 8.4–10.5)
CALCIUM: 7.6 mg/dL — AB (ref 8.4–10.5)
CO2: 24 mEq/L (ref 19–32)
CO2: 25 mEq/L (ref 19–32)
CREATININE: 1.1 mg/dL (ref 0.50–1.10)
Chloride: 101 mEq/L (ref 96–112)
Chloride: 104 mEq/L (ref 96–112)
Creatinine, Ser: 1.21 mg/dL — ABNORMAL HIGH (ref 0.50–1.10)
GFR calc Af Amer: 46 mL/min — ABNORMAL LOW (ref >90)
GFR calc Af Amer: 52 mL/min — ABNORMAL LOW (ref >90)
GFR calc non Af Amer: 40 mL/min — ABNORMAL LOW (ref >90)
GFR calc non Af Amer: 44 mL/min — ABNORMAL LOW (ref >90)
GLUCOSE: 188 mg/dL — AB (ref 70–99)
Glucose, Bld: 195 mg/dL — ABNORMAL HIGH (ref 70–99)
POTASSIUM: 3.7 meq/L (ref 3.7–5.3)
Potassium: 3.2 mEq/L — ABNORMAL LOW (ref 3.7–5.3)
SODIUM: 139 meq/L (ref 137–147)
Sodium: 138 mEq/L (ref 137–147)

## 2014-05-16 MED ORDER — INSULIN GLARGINE 100 UNIT/ML ~~LOC~~ SOLN
15.0000 [IU] | Freq: Every day | SUBCUTANEOUS | Status: DC
  Administered 2014-05-16: 15 [IU] via SUBCUTANEOUS
  Filled 2014-05-16: qty 0.15

## 2014-05-16 MED ORDER — FUROSEMIDE 10 MG/ML IJ SOLN
10.0000 mg | Freq: Two times a day (BID) | INTRAMUSCULAR | Status: DC
  Filled 2014-05-16: qty 1

## 2014-05-16 MED ORDER — SODIUM CHLORIDE 0.9 % IV BOLUS (SEPSIS)
500.0000 mL | Freq: Once | INTRAVENOUS | Status: AC
  Administered 2014-05-16: 500 mL via INTRAVENOUS

## 2014-05-16 MED ORDER — FUROSEMIDE 10 MG/ML IJ SOLN
20.0000 mg | Freq: Two times a day (BID) | INTRAMUSCULAR | Status: DC
  Administered 2014-05-16 – 2014-05-17 (×3): 20 mg via INTRAVENOUS
  Filled 2014-05-16 (×6): qty 2

## 2014-05-16 MED ORDER — ENOXAPARIN SODIUM 30 MG/0.3ML ~~LOC~~ SOLN
30.0000 mg | SUBCUTANEOUS | Status: DC
  Filled 2014-05-16: qty 0.3

## 2014-05-16 MED ORDER — METOPROLOL TARTRATE 50 MG PO TABS
50.0000 mg | ORAL_TABLET | Freq: Two times a day (BID) | ORAL | Status: DC
  Administered 2014-05-16 – 2014-05-25 (×17): 50 mg via ORAL
  Filled 2014-05-16 (×22): qty 1

## 2014-05-16 MED ORDER — PANTOPRAZOLE SODIUM 40 MG IV SOLR
40.0000 mg | Freq: Every day | INTRAVENOUS | Status: DC
  Administered 2014-05-16: 40 mg via INTRAVENOUS
  Filled 2014-05-16: qty 40

## 2014-05-16 MED ORDER — DEXTROSE 50 % IV SOLN
INTRAVENOUS | Status: AC
  Administered 2014-05-16: 25 mL
  Filled 2014-05-16: qty 50

## 2014-05-16 MED ORDER — SODIUM CHLORIDE 0.9 % IJ SOLN
10.0000 mL | INTRAMUSCULAR | Status: DC | PRN
  Administered 2014-05-16: 20 mL
  Administered 2014-05-16 – 2014-05-20 (×9): 10 mL
  Administered 2014-05-21: 20 mL
  Administered 2014-05-22 – 2014-05-23 (×3): 10 mL

## 2014-05-16 MED ORDER — PANTOPRAZOLE SODIUM 40 MG IV SOLR
40.0000 mg | Freq: Two times a day (BID) | INTRAVENOUS | Status: DC
  Administered 2014-05-16 – 2014-05-20 (×9): 40 mg via INTRAVENOUS
  Filled 2014-05-16 (×11): qty 40

## 2014-05-16 MED ORDER — FUROSEMIDE 10 MG/ML IJ SOLN
10.0000 mg | Freq: Once | INTRAMUSCULAR | Status: DC
  Filled 2014-05-16: qty 1

## 2014-05-16 MED ORDER — HYDROMORPHONE HCL PF 1 MG/ML IJ SOLN
0.5000 mg | Freq: Four times a day (QID) | INTRAMUSCULAR | Status: DC | PRN
  Administered 2014-05-16 – 2014-05-24 (×2): 0.5 mg via INTRAVENOUS
  Filled 2014-05-16 (×2): qty 1

## 2014-05-16 MED ORDER — ACETAMINOPHEN 10 MG/ML IV SOLN
1000.0000 mg | Freq: Three times a day (TID) | INTRAVENOUS | Status: AC
  Administered 2014-05-16 – 2014-05-17 (×3): 1000 mg via INTRAVENOUS
  Filled 2014-05-16 (×3): qty 100

## 2014-05-16 MED ORDER — DEXTROSE 50 % IV SOLN: 25.0000 mL | Freq: Once | INTRAVENOUS | Status: AC | PRN

## 2014-05-16 MED ORDER — DILTIAZEM HCL ER 180 MG PO CP24
180.0000 mg | ORAL_CAPSULE | Freq: Every morning | ORAL | Status: DC
  Filled 2014-05-16: qty 1

## 2014-05-16 MED ORDER — DEXTROSE 50 % IV SOLN
INTRAVENOUS | Status: AC
  Administered 2014-05-16
  Filled 2014-05-16: qty 50

## 2014-05-16 MED ORDER — PIPERACILLIN-TAZOBACTAM 3.375 G IVPB 30 MIN
3.3750 g | Freq: Three times a day (TID) | INTRAVENOUS | Status: DC
  Administered 2014-05-16 – 2014-05-23 (×21): 3.375 g via INTRAVENOUS
  Filled 2014-05-16 (×23): qty 50

## 2014-05-16 MED ORDER — FUROSEMIDE 10 MG/ML IJ SOLN
10.0000 mg | Freq: Every day | INTRAMUSCULAR | Status: DC
  Filled 2014-05-16: qty 1

## 2014-05-16 MED ORDER — DEXTROSE-NACL 5-0.45 % IV SOLN
INTRAVENOUS | Status: DC
  Administered 2014-05-16: 150 mL via INTRAVENOUS
  Administered 2014-05-16: 14:00:00 via INTRAVENOUS

## 2014-05-16 NOTE — Progress Notes (Signed)
Peripherally Inserted Central Catheter/Midline Placement  The IV Nurse has discussed with the patient and/or persons authorized to consent for the patient, the purpose of this procedure and the potential benefits and risks involved with this procedure.  The benefits include less needle sticks, lab draws from the catheter and patient may be discharged home with the catheter.  Risks include, but not limited to, infection, bleeding, blood clot (thrombus formation), and puncture of an artery; nerve damage and irregular heat beat.  Alternatives to this procedure were also discussed.  Husband at bedside signed consent.  Pt oriented but drowsy/ lethargic.  PICC/Midline Placement Documentation  PICC / Midline Double Lumen 56/25/63 PICC Left Basilic 42 cm 0 cm (Active)  Indication for Insertion or Continuance of Line Chronic illness with exacerbations (CF, Sickle Cell, etc.);Poor Vasculature-patient has had multiple peripheral attempts or PIVs lasting less than 24 hours;Limited venous access - need for IV therapy >5 days (PICC only) 05/16/2014 12:48 PM  Exposed Catheter (cm) 0 cm 05/16/2014 12:48 PM  Site Assessment Clean;Dry;Intact 05/16/2014 12:48 PM  Lumen #1 Status Flushed;Saline locked;Blood return noted 05/16/2014 12:48 PM  Lumen #2 Status Flushed;Saline locked;Blood return noted 05/16/2014 12:48 PM  Dressing Type Transparent 05/16/2014 12:48 PM  Dressing Status Clean;Dry;Intact;Antimicrobial disc in place 05/16/2014 12:48 PM  Line Care Connections checked and tightened 05/16/2014 12:48 PM  Dressing Intervention New dressing 05/16/2014 12:48 PM  Dressing Change Due 05/23/14 05/16/2014 12:48 PM       Rolena Infante 05/16/2014, 12:50 PM

## 2014-05-16 NOTE — Progress Notes (Addendum)
Hypoglycemic Event  CBG: 67  Treatment: D50 IV 25 mL  Symptoms: None  Follow-up CBG: Time:2350 CBG Result:145  Possible Reasons for Event: Other: npo  Comments/MD notified:     Rosie Fate  Remember to initiate Hypoglycemia Order Set & complete

## 2014-05-16 NOTE — Progress Notes (Signed)
Dr. Cruzita Lederer aware via phone of recent BP 103/72 w/ HR 62. Pt sleeping soundly & able to arouse yet no fully alert. Discussed current po meds on pt's schedule. Md instructed RN to hold BP med this am and Kdur was discontinued.

## 2014-05-16 NOTE — Progress Notes (Signed)
Hypoglycemic Event  CBG:46 at 0445 Treatment: D50 IV 25 mL  Symptoms: None  Follow-up CBG: Time0500 CBG Result 263 Possible Reasons for Event: Vomiting  Comments/MD notified: Melrose Nakayama Cassity Christian  Remember to initiate Hypoglycemia Order Set & completeAdult Hypoglycemia Protocol Treatment Guidelines  1. RN shall initiate Hypoglycemia Protocol emergency measures immediately when:            w        Routine or STAT CBG and/or a lab glucose indicates hypoglycemia (CBG < 70 mg/dl)  2. Treat the patient according to ability to take PO's and severity of hypoglycemia.   3. If patient is on GlucoStabilizer, follow directions provided by the [facility] for hypoglycemic events.  4. If patient on insulin pump, follow Hypoglycemia Protocol.  If patient requires more than one treatment have patient place pump in SUSPEND and notify MD.  DO NOT leave pump in SUSPEND for greater than 30 minutes unless ordered by MD.  A. Treatment for Mild or Moderate-Patient cooperative and able to swallow    1.  Patient taking PO's and can cooperate   a.  Give one of the following 15 gram CHO options:                           w     1 tube oral dextrose gel                           w     3-4 Glucose tablets                           w     4 oz. Juice                           w     4 oz. regular soda                                    ESRD patients:  clear, regular soda                           w     8 oz. skim milk    b.  Recheck CBG in 15 minutes after treatment                            w       If CBG < 70 mg/dl, repeat treatment and recheck until hypoglycemia is resolved                            w       If CBG > 70 mg/dl and next meal is more than 1 hour away, give additional 15 grams CHO   2.  Patient NPO-Patient cooperative and no altered mental status    a.  Give 25 ml of D50 IV.   b.  Recheck CBG in 15 minutes after treatment.                             w  If CBG is less than 70 mg/dl, repeat treatment and recheck until hypoglycemia is resolved.   c.  Notify MD for further orders.             SPECIAL CONSIDERATIONS:    a.  If no IV access,                              w        Start IV of D5W at [facility]                             w        Give 25 ml of D50 IV.    b.  If unable to gain IV access                             w          Give Glucagon IM:     i.  1 mg if patient weighs more than 45.5 kg     ii.  0.5 mg if patient weighs less than 45.5 kg   c.  Notify MD for further orders  B. Treatment for Severe-- Patient unconscious or unable to take PO's safely    1.  Position patient on side   2.  Give 50 ml D50 IV   3.  Recheck CBG in 15 minutes.                    w      If CBG is less than 70 mg/dl, repeat treatment and recheck until hypoglycemia is resolved.   4.  Notify MD for further orders.    SPECIAL CONSIDERATIONS:    a.  If no IV access                              w     Give Glucagon IM                                              i.  1 mg if patient weighs more than 45.5 kg                                             ii.  0.5 mg if patient weighs less than 45.5 kg                              w      Start IV of D5W at 50 ml/hr and give 50 ml D50 IV   b.  If no IV access and active seizure                               w       Call Rapid Response   c.  If unable to gain IV access, give Glucagon IM:  w          1 mg if patient weighs more than 45.5 kg                              w          0.5 mg if patient weighs less than 45.5 kg   d.  Notify MD for further orders.  C. Complete smart text progress note to document intervention and follow-up CBG   1. In [facility] patient chart, click on Notes (left side of screen)   2. Create Progress Note   3. Click on [employer].  In the Match box type "hypo" and enter    4. Double click on [facility] IP HYPOGLYCEMIC EVENT and enter data   5. MD must be  notified if patient is NPO or experienced severe hypoglycemia

## 2014-05-16 NOTE — Progress Notes (Signed)
Patient vomited huge amount coffee ground emesis.L [place],MD notified and order received .

## 2014-05-16 NOTE — Progress Notes (Signed)
Called to room 1528 at 0620 per [facility] S. for pt with low bp. Per floor RN pt has vomited tonight 2 coffee ground emesis and has had low bp. Dr Delsa Sale on call for pt attending notified around 0400 of patient condition per floor RN. MD to come see pt early this AM. Upon my arrival EKG in process yielding A. Fib with controlled rate 60s. NS bolus 549ml infusing. Pt lethargic, denies pain moves all extremeties but left side is weak. Pt has history of stroke with left side residual per RN. Pt placed on RRT monitor. Lung sounds clear. ABD soft mild distention surgical site and colostomy intact. HR 50-60 afib with occasional pauses, po2 100% on 4 L [place]  bp trending up. Pt repositioned in bed. MD called and message left at 0650 to update on condition. Upon my exit pt left resting in bed, VSS. Floor RN Jeannett Senior to monitor closely.

## 2014-05-16 NOTE — Consult Note (Signed)
Reason for Consult: Coffee ground emesis Referring Physician: GYN team  [name] is an 80 y.o. female.  HPI: Patient without any significant GI history seen for coffee ground emesis x2 postop and she was not on any obvious aspirin or nonsteroidals as an outpatient and had minimal GI symptoms preop but no previous GI workup and she was restarted on her Lovenox and Coumadin and currently today she is feeling better but has not had anything in her ostomy and has no other complaints and her case was discussed with the patient and her husband  Past Medical History  Diagnosis Date  . Hypertension   . Hyperlipidemia   . Obesity   . Hypothyroidism   . Vulvar cancer, carcinoma 11/03/2011  . Atrial fibrillation   . Cancer 2007    vulva-invasive well diff squamous cell ca  . CVA (cerebral infarction)   . Dyslipidemia   . History of radiation therapy 10/20/2013-12/01/2013    60 gray to perineum area and lower pelvis  . Diabetes mellitus 05-04-14    "Diabetic coma" 1 week ago -tx. by EMT [facility] residence until pt. feeling improved and stable.  . Shortness of breath     occ. with coughing up phelgm usually mornings  . 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 Abnormal voice 05-04-14    "raspy voice" with inspiratory effort-Dr. Lissa Hoard aware 05-04-14  . Stroke 05/2000    right brain CVA pre H&P 2001-    Past Surgical History  Procedure Laterality Date  . Vulvar lesion removal  2012  . Breast surgery      Lumpectomy  . Dilation and curettage of uterus    . Cystourethroplasty / ureteroneocystostomy    . Radical vulvectomy    . Lymphadenectomy  2007    R inguinal femoral  . Radical vulvectomy  02/27/12  . Vulvar lesion removal  02/27/2012    Procedure: VULVAR LESION;  Surgeon: Alvino Chapel, MD;  Location: [facility];  Service: Gynecology;  Laterality: N/A;  . Right carotid endarterctomy    . Left eye surgery      , permanently stitched eye together  . Vulvectomy N/A  05/12/2014    Procedure: RADICAL VULVECTOMY WITH COLOSTOMY;  Surgeon: Alvino Chapel, MD;  Location: [facility];  Service: Gynecology;  Laterality: N/A;  . Laparotomy N/A 05/12/2014    Procedure: EXPLORATORY LAPAROTOMY, TOTAL ABDO MINAL HYSTERECTOMY AND BILATERAL SALPINGOOPHERECTOMY;  Surgeon: Alvino Chapel, MD;  Location: [facility];  Service: Gynecology;  Laterality: N/A;    Family History  Problem Relation Age of Onset  . Diabetes type II    . Coronary artery disease Mother   . Lung cancer Brother   . Diabetes type II Brother     Social History:  reports that she quit smoking about 46 years ago. Her smoking use included Cigarettes. She has a 4.5 pack-year smoking history. She has never used smokeless tobacco. She reports that she does not drink alcohol or use illicit drugs.  Allergies:  Allergies  Allergen Reactions  . Tenex [Guanfacine Hcl] Other (See Comments)    hypotension  . Crestor [Rosuvastatin] Other (See Comments)    Myalgia    Medications: I have reviewed the patient's current medications.  Results for orders placed during the hospital encounter of 05/12/14 (from the past 48 hour(s))  GLUCOSE, CAPILLARY     Status: Abnormal   Collection Time    05/14/14 12:08 PM  Result Value Ref Range   Glucose-Capillary 105 (*) 70 - 99 mg/dL   Comment 1 Notify RN    GLUCOSE, CAPILLARY     Status: Abnormal   Collection Time    05/14/14  3:58 PM      Result Value Ref Range   Glucose-Capillary 174 (*) 70 - 99 mg/dL   Comment 1 Notify RN    GLUCOSE, CAPILLARY     Status: Abnormal   Collection Time    05/14/14  7:44 PM      Result Value Ref Range   Glucose-Capillary 218 (*) 70 - 99 mg/dL  GLUCOSE, CAPILLARY     Status: Abnormal   Collection Time    05/14/14 11:34 PM      Result Value Ref Range   Glucose-Capillary 213 (*) 70 - 99 mg/dL  GLUCOSE, CAPILLARY     Status: Abnormal   Collection Time    05/15/14  4:08 AM      Result Value Ref Range    Glucose-Capillary 219 (*) 70 - 99 mg/dL  CBC     Status: Abnormal   Collection Time    05/15/14  4:41 AM      Result Value Ref Range   WBC 16.4 (*) 4.0 - 10.5 K/uL   RBC 4.17  3.87 - 5.11 MIL/uL   Hemoglobin 12.6  12.0 - 15.0 g/dL   Comment: DELTA CHECK NOTED     REPEATED TO VERIFY   HCT 37.0  36.0 - 46.0 %   MCV 88.7  78.0 - 100.0 fL   Comment: DELTA CHECK NOTED     REPEATED TO VERIFY   MCH 30.2  26.0 - 34.0 pg   MCHC 34.1  30.0 - 36.0 g/dL   RDW 18.5 (*) 11.5 - 15.5 %   Platelets 159  150 - 400 K/uL  BASIC METABOLIC PANEL     Status: Abnormal   Collection Time    05/15/14  4:41 AM      Result Value Ref Range   Sodium 129 (*) 137 - 147 mEq/L   Potassium 3.8  3.7 - 5.3 mEq/L   Comment: DELTA CHECK NOTED   Chloride 97  96 - 112 mEq/L   CO2 18 (*) 19 - 32 mEq/L   Glucose, Bld 242 (*) 70 - 99 mg/dL   BUN 18  6 - 23 mg/dL   Creatinine, Ser 1.14 (*) 0.50 - 1.10 mg/dL   Calcium 8.5  8.4 - 10.5 mg/dL   GFR calc non Af Amer 43 (*) >90 mL/min   GFR calc Af Amer 49 (*) >90 mL/min   Comment: (NOTE)     The eGFR has been calculated using the CKD EPI equation.     This calculation has not been validated in all clinical situations.     eGFR's persistently <90 mL/min signify possible Chronic Kidney     Disease.  GLUCOSE, CAPILLARY     Status: Abnormal   Collection Time    05/15/14  7:38 AM      Result Value Ref Range   Glucose-Capillary 220 (*) 70 - 99 mg/dL  GLUCOSE, CAPILLARY     Status: Abnormal   Collection Time    05/15/14 11:30 AM      Result Value Ref Range   Glucose-Capillary 177 (*) 70 - 99 mg/dL  GLUCOSE, CAPILLARY     Status: Abnormal   Collection Time    05/15/14  4:08 PM      Result Value Ref Range  Glucose-Capillary 130 (*) 70 - 99 mg/dL  GLUCOSE, CAPILLARY     Status: Abnormal   Collection Time    05/15/14  7:57 PM      Result Value Ref Range   Glucose-Capillary 153 (*) 70 - 99 mg/dL  GLUCOSE, CAPILLARY     Status: Abnormal   Collection Time    05/15/14  11:51 PM      Result Value Ref Range   Glucose-Capillary 144 (*) 70 - 99 mg/dL  GLUCOSE, CAPILLARY     Status: Abnormal   Collection Time    05/16/14  4:44 AM      Result Value Ref Range   Glucose-Capillary 46 (*) 70 - 99 mg/dL  CBC     Status: Abnormal   Collection Time    05/16/14  4:45 AM      Result Value Ref Range   WBC 17.5 (*) 4.0 - 10.5 K/uL   RBC 3.32 (*) 3.87 - 5.11 MIL/uL   Hemoglobin 10.1 (*) 12.0 - 15.0 g/dL   Comment: DELTA CHECK NOTED     REPEATED TO VERIFY   HCT 30.1 (*) 36.0 - 46.0 %   MCV 90.7  78.0 - 100.0 fL   MCH 30.4  26.0 - 34.0 pg   MCHC 33.6  30.0 - 36.0 g/dL   RDW 18.2 (*) 11.5 - 15.5 %   Platelets 189  150 - 400 K/uL  PROTIME-INR     Status: Abnormal   Collection Time    05/16/14  4:45 AM      Result Value Ref Range   Prothrombin Time 26.8 (*) 11.6 - 15.2 seconds   INR 2.58 (*) 0.00 - 3.66  BASIC METABOLIC PANEL     Status: Abnormal   Collection Time    05/16/14  4:45 AM      Result Value Ref Range   Sodium 139  137 - 147 mEq/L   Comment: RESULT REPEATED AND VERIFIED     DELTA CHECK NOTED   Potassium 3.7  3.7 - 5.3 mEq/L   Chloride 101  96 - 112 mEq/L   CO2 24  19 - 32 mEq/L   Glucose, Bld 195 (*) 70 - 99 mg/dL   BUN 41 (*) 6 - 23 mg/dL   Comment: RESULT REPEATED AND VERIFIED     DELTA CHECK NOTED   Creatinine, Ser 1.21 (*) 0.50 - 1.10 mg/dL   Calcium 8.2 (*) 8.4 - 10.5 mg/dL   GFR calc non Af Amer 40 (*) >90 mL/min   GFR calc Af Amer 46 (*) >90 mL/min   Comment: (NOTE)     The eGFR has been calculated using the CKD EPI equation.     This calculation has not been validated in all clinical situations.     eGFR's persistently <90 mL/min signify possible Chronic Kidney     Disease.  HEPATIC FUNCTION PANEL     Status: Abnormal   Collection Time    05/16/14  4:45 AM      Result Value Ref Range   Total Protein 4.9 (*) 6.0 - 8.3 g/dL   Albumin 1.9 (*) 3.5 - 5.2 g/dL   AST 15  0 - 37 U/L   ALT 10  0 - 35 U/L   Alkaline Phosphatase 61  39  - 117 U/L   Total Bilirubin 0.8  0.3 - 1.2 mg/dL   Bilirubin, Direct 0.3  0.0 - 0.3 mg/dL   Indirect Bilirubin 0.5  0.3 - 0.9 mg/dL  GLUCOSE, CAPILLARY     Status: Abnormal   Collection Time    05/16/14  5:04 AM      Result Value Ref Range   Glucose-Capillary 263 (*) 70 - 99 mg/dL  TYPE AND SCREEN     Status: None   Collection Time    05/16/14  6:32 AM      Result Value Ref Range   ABO/RH(D) A NEG     Antibody Screen NEG     Sample Expiration 05/19/2014    URINALYSIS, ROUTINE W REFLEX MICROSCOPIC     Status: Abnormal   Collection Time    05/16/14  8:35 AM      Result Value Ref Range   Color, Urine AMBER (*) YELLOW   Comment: BIOCHEMICALS MAY BE AFFECTED BY COLOR   APPearance CLEAR  CLEAR   Specific Gravity, Urine 1.020  1.005 - 1.030   pH 6.0  5.0 - 8.0   Glucose, UA NEGATIVE  NEGATIVE mg/dL   Hgb urine dipstick NEGATIVE  NEGATIVE   Bilirubin Urine NEGATIVE  NEGATIVE   Ketones, ur NEGATIVE  NEGATIVE mg/dL   Protein, ur NEGATIVE  NEGATIVE mg/dL   Urobilinogen, UA 1.0  0.0 - 1.0 mg/dL   Nitrite NEGATIVE  NEGATIVE   Leukocytes, UA NEGATIVE  NEGATIVE   Comment: MICROSCOPIC NOT DONE ON URINES WITH NEGATIVE PROTEIN, BLOOD, LEUKOCYTES, NITRITE, OR GLUCOSE <1000 mg/dL.    No results found.  ROS negative except above she does have some minimal lower postop pain but no upper discomfort Blood pressure 103/72, pulse 62, temperature 97.1 F (36.2 C), temperature source Oral, resp. rate 16, height $RemoveBe'5\' 4"'MkTHnaENm$  (1.626 m), weight 68.4 kg (150 lb 12.7 oz), SpO2 100.00%. Physical Exam vital signs stable afebrile no acute distress abdomen occasional bowel sound soft nontender upper abdomen labs reviewed increased BUN and creatinine slightly concerning  Assessment/Plan: Postop coffee-ground emesis in a patient on blood thinners Plan: Agree with holding blood thinners for now and okay to use clear liquids when okay from a surgical standpoint and I discussed and endoscopy with the patient and her  husband but they would like to hold off for now and see how it goes however I expect the hemoglobin to drop as BUN and creatinine drops as well and would proceed with endoscopy if it would be helpful to make sure it is okay to restart blood thinners and will check on tomorrow  Jeryl Columbia 05/16/2014, 11:12 AM

## 2014-05-16 NOTE — Progress Notes (Signed)
PT Cancellation Note  Patient Details Name: [name] MRN: [id number] DOB: 10-09-1926   Cancelled Treatment:     PT attempted this am.  Deferred at request of RN - RN reports pt hypotensive (80s/40s) and vomiting coffee grounds.  Will follow.   Mathis Fare 05/16/2014, 10:39 AM

## 2014-05-16 NOTE — Progress Notes (Signed)
Patient ID: [name], female   DOB: 28-Sep-1926, 80 y.o.   MRN: [id number] 4 Days Post-Op Procedure(s) (LRB): RADICAL VULVECTOMY WITH COLOSTOMY (N/A) EXPLORATORY LAPAROTOMY, TOTAL ABDO MINAL HYSTERECTOMY AND BILATERAL SALPINGOOPHERECTOMY (N/A)  Subjective: Patient resting  Objective: Vital signs in last 24 hours: Temp:  [97.1 F (36.2 C)-98 F (36.7 C)] 97.1 F (36.2 C) (05/23 0900) Pulse Rate:  [62-80] 62 (05/23 0900) Resp:  [14-22] 16 (05/23 0900) BP: (86-141)/(42-80) 103/72 mmHg (05/23 0900) SpO2:  [90 %-100 %] 100 % (05/23 0900)    Intake/Output from previous day: 05/22 0701 - 05/23 0700 In: 988.3 [I.V.:988.3] Out: 2230 [Urine:1900; Emesis/NG output:300; Drains:30]  CBG (last 3)   Recent Labs  05/15/14 2351 05/16/14 0444 05/16/14 0504  GLUCAP 144* 46* 263*     Physical Examination: General: somnolent but arousable Resp: wheezes anterior, good air movement Cardio: irregular rhythm GI: soft, non-tender; bowel sounds normal;  incision: clean, dry and intact Extremities: extremities normal, atraumatic, no cyanosis or edema Vaginal Bleeding: minimal Stoma pink, no gas in colostomy bag  Labs: WBC/Hgb/Hct/Plts:  17.5/10.1/30.1/189 (05/23 0445) BUN/Cr/glu/ALT/AST/amyl/lip:  41/1.21/--/10/15/--/-- (05/23 0445)  EKG: afib; ventricular rate 60 bpm Assessment:  80 y.o. s/p Procedure(s): RADICAL VULVECTOMY WITH COLOSTOMY EXPLORATORY LAPAROTOMY, TOTAL ABDO MINAL HYSTERECTOMY AND BILATERAL SALPINGOOPHERECTOMY: stable Pain:  Pain is well-controlled on oral pain medicaions  Heme: Anemia: stable  CV: Arrhythmia: chronic atrial fibrilation Hypertension: B/Ps in the low normal range. Current treatment:  diltiazem (Cardizem) and metoprolol (Lopressor, Toprol).  GI:  Acute GI bleeding. No h/o GI d/o. NPO. Postoperative ileus.  FEN: Electrolytes in range  Pulm:  H/o chronic SOB; r/o overload.  O2 sats stable on nasal cannula.  Endo: Diabetes mellitus Type II, under  inadequate control..  CBGs 40 -260   Neuro: Minimal sundowning--no agitation  Prophylaxis: no pharmacologic prophylaxis/or bridging in the setting of the GI bleeding  ID:  WBC trending up.  Possible infection--?source; catheter associated UTI, intraabdominal process Plan: Encourage ambulation CXR/AXR PICC line Discontinue lovenox/coumadin PPI NPO Respiratory therapy Consult Hospitalist, GI Zosyn empirically Continue dressing changes BID    LOS: 4 days    [name] 05/16/2014, 9:16 AM

## 2014-05-16 NOTE — Consult Note (Signed)
[facility] Medical Consultation  [name] BOF:[id number] DOB: 21-Dec-1945 DOA: 05/12/2014 PCP: Elayne Snare, MD   Requesting physician: Dr. Fermin Schwab Date of consultation: 05/16/14 Reason for consultation: multiple medical problems  Impression/Recommendations Active Problems:   Vulvar cancer, carcinoma   Hyperlipidemia   Hypothyroidism   Leukocytosis   CVA (cerebral infarction)   Type II or unspecified type diabetes mellitus with renal manifestations, uncontrolled(250.42)   Anemia   Obesity   Atrial fibrillation   Chronic kidney disease, stage III (moderate)   Vulva cancer   Chronic anticoagulation   1. GI bleed - per Dr. Fermin Schwab, GI has been also consulted. Will increase Protonix to BID. Repeat CBC. She has poor peripheral access, agree with PICC line.  2. IDDM - hypoglycemic episode overnight, decrease basal insulin by ~20%. Keep NPO for now pending GI evaluation.  3. Chronic anticoagulation - on hold for now given #1. She has had CVAs and is currently in A fib.  4. A fib - recently seen by Dr. Marlou Porch pre-op, with moderate risk. She is on Diltiazem 180 mg daily and on Metoprolol 50 mg BID. In the setting of hypotension would hold Diltiazem dose this morning. Continue Metoprolol since it doesn't affect the BP as much. Stop anticoagulation at this point given #1. 5. Aortic stenosis - last 2D echo 55-60% with moderate AS (03/2012). Will follow up on CXR, if evidence of fluid overload will repeat echo. Monitor fluid status closely.  6. Prior CVA with residual left sided weakness 7. S/p radical vulvectomy with colostomy, total abdominal hysterectomy and bilateral salpingoopherectomy for recurrent vulvar cancer - per primary team  I will followup again tomorrow. Please contact me if I can be of assistance in the meanwhile. Thank you for this consultation.  HPI:  80 yo F with history of recurrent vulvar cancer, A fib on chronic anticoagulation, IDDM, admitted to  surgery service on the 19th and underwent surgery as per #7 above. This morning patient with nausea/vomiting and per overnight nursing staff with large amount of coffee ground emesis and associated hypotension. Patient this morning is drowsy but wakes up easily, alert and oriented x 3, and has no specific complaints other than abdominal pain at the site of her surgery.   Review of Systems:  As per HPI otherwise negative. Denies chest pain, breathing difficulties, epigastric pain, nausea, denies lightheadedness or dizziness.   Past Medical History  Diagnosis Date  . Hypertension   . Hyperlipidemia   . Obesity   . Hypothyroidism   . Vulvar cancer, carcinoma 11/03/2011  . Atrial fibrillation   . Cancer 2007    vulva-invasive well diff squamous cell ca  . CVA (cerebral infarction)   . Dyslipidemia   . History of radiation therapy 10/20/2013-12/01/2013    60 gray to perineum area and lower pelvis  . Diabetes mellitus 05-04-14    "Diabetic coma" 1 week ago -tx. by EMT At residence until pt. feeling improved and stable.  . Shortness of breath     occ. with coughing up phelgm usually mornings  . 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 Abnormal voice 05-04-14    "raspy voice" with inspiratory effort-Dr. Lissa Hoard aware 05-04-14  . Stroke 05/2000    right brain CVA pre H&P 2001-   Past Surgical History  Procedure Laterality Date  . Vulvar lesion removal  2012  . Breast surgery      Lumpectomy  . Dilation and curettage of uterus    .  Cystourethroplasty / ureteroneocystostomy    . Radical vulvectomy    . Lymphadenectomy  2007    R inguinal femoral  . Radical vulvectomy  02/27/12  . Vulvar lesion removal  02/27/2012    Procedure: VULVAR LESION;  Surgeon: Alvino Chapel, MD;  Location: [facility];  Service: Gynecology;  Laterality: N/A;  . Right carotid endarterctomy    . Left eye surgery      , permanently stitched eye together  . Vulvectomy N/A 05/12/2014    Procedure:  RADICAL VULVECTOMY WITH COLOSTOMY;  Surgeon: Alvino Chapel, MD;  Location: [facility];  Service: Gynecology;  Laterality: N/A;  . Laparotomy N/A 05/12/2014    Procedure: EXPLORATORY LAPAROTOMY, TOTAL ABDO MINAL HYSTERECTOMY AND BILATERAL SALPINGOOPHERECTOMY;  Surgeon: Alvino Chapel, MD;  Location: [facility];  Service: Gynecology;  Laterality: N/A;   Social History:  reports that she quit smoking about 46 years ago. Her smoking use included Cigarettes. She has a 4.5 pack-year smoking history. She has never used smokeless tobacco. She reports that she does not drink alcohol or use illicit drugs.  Allergies  Allergen Reactions  . Tenex [Guanfacine Hcl] Other (See Comments)    hypotension  . Crestor [Rosuvastatin] Other (See Comments)    Myalgia   Family History  Problem Relation Age of Onset  . Diabetes type II    . Coronary artery disease Mother   . Lung cancer Brother   . Diabetes type II Brother     Prior to Admission medications   Medication Sig Start Date End Date Taking? Authorizing Provider  atorvastatin (LIPITOR) 20 MG tablet Take 20 mg by mouth daily.   Yes Historical Provider, MD  calcitRIOL (ROCALTROL) 0.25 MCG capsule Take 0.25 mcg by mouth every Monday, Wednesday, and Friday.   Yes Historical Provider, MD  diltiazem (DILACOR XR) 180 MG 24 hr capsule Take 180 mg by mouth every morning.   Yes Historical Provider, MD  enoxaparin (LOVENOX) 40 MG/0.4ML injection Inject 40 mg into the skin daily. Pt unsure of dose 05/07/14  Yes Historical Provider, MD  furosemide (LASIX) 20 MG tablet Take 20 mg by mouth every morning.   Yes Historical Provider, MD  insulin detemir (LEVEMIR) 100 UNIT/ML injection Inject 16-22 Units into the skin 2 (two) times daily. 22 in am and 16  in pm 08/13/12  Yes Sorin C Marye Round, MD  insulin lispro (HUMALOG) 100 UNIT/ML injection Inject 14-22 Units into the skin 3 (three) times daily before meals. 14 in the morning 14 at lunch and 22 at supper   Yes  Historical Provider, MD  levothyroxine (SYNTHROID, LEVOTHROID) 100 MCG tablet Take 100 mcg by mouth as directed. Every day except sunday   Yes Historical Provider, MD  metoprolol (LOPRESSOR) 50 MG tablet Take 50 mg by mouth 2 (two) times daily.   Yes Historical Provider, MD  oxyCODONE-acetaminophen (PERCOCET/ROXICET) 5-325 MG per tablet Take 1 tablet by mouth every 4 (four) hours as needed for severe pain.   Yes Historical Provider, MD  potassium chloride (K-DUR,KLOR-CON) 10 MEQ tablet Take 10 mEq by mouth daily.   Yes Historical Provider, MD  warfarin (COUMADIN) 5 MG tablet Take 2.5-5 mg by mouth daily. Last dose to be 05-06-14, then Lovenox 05-07-14,05-08-14.    Historical Provider, MD   Physical Exam: Blood pressure 115/58, pulse 67, temperature 97.6 F (36.4 C), temperature source Oral, resp. rate 15, height 5\' 4"  (1.626 m), weight 68.4 kg (150 lb 12.7 oz), SpO2 100.00%. Filed Vitals:   05/16/14 0700  BP: 115/58  Pulse: 67  Temp:   Resp: 15     General:  Drowsy, but wakes up easily, alert to place, time and person  Eyes: no scleral icterus, closed left eye, chronic (per patient)  Neck: no JVD  Cardiovascular: irregular, 3/6 SEM  Respiratory: clear, moves air well, no wheezing  Abdomen: tender to palpation throughout  Skin: no rashes  Musculoskeletal: no edema  Psychiatric: drowsy  Neurologic: moves all 4, mild left sided residual weakness; follows commands well   Labs on Admission:  Basic Metabolic Panel:  Recent Labs Lab 05/12/14 1820 05/13/14 0325 05/14/14 0535 05/15/14 0441 05/16/14 0445  NA 139 136* 132* 129* 139  K 4.5 5.3 4.6 3.8 3.7  CL 103 103 100 97 101  CO2 24 22 20  18* 24  GLUCOSE 206* 280* 215* 242* 195*  BUN 25* 23 20 18  41*  CREATININE 1.18* 1.25* 1.36* 1.14* 1.21*  CALCIUM 8.1* 7.8* 8.1* 8.5 8.2*   Liver Function Tests:  Recent Labs Lab 05/16/14 0445  AST 15  ALT 10  ALKPHOS 61  BILITOT 0.8  PROT 4.9*  ALBUMIN 1.9*    CBC:  Recent Labs Lab 05/12/14 2338 05/13/14 0325 05/14/14 0535 05/15/14 0441 05/16/14 0445  WBC 14.4* 12.8* 15.1* 16.4* 17.5*  HGB 9.2* 9.3* 8.4* 12.6 10.1*  HCT 29.4* 29.6* 26.6* 37.0 30.1*  MCV 99.3 99.7 98.5 88.7 90.7  PLT 158 170 149* 159 189   CBG:  Recent Labs Lab 05/15/14 1608 05/15/14 1957 05/15/14 2351 05/16/14 0444 05/16/14 0504  GLUCAP 130* 153* 144* 46* 263*    Radiological Exams on Admission: No results found.  EKG: Independently reviewed. A fib  Time spent: [facility] Pager [phone]  If 7PM-7AM, please contact night-coverage [email or web address] Password [facility] 05/16/2014, 8:45 AM

## 2014-05-17 DIAGNOSIS — I359 Nonrheumatic aortic valve disorder, unspecified: Secondary | ICD-10-CM

## 2014-05-17 LAB — BASIC METABOLIC PANEL
BUN: 34 mg/dL — AB (ref 6–23)
BUN: 31 mg/dL — ABNORMAL HIGH (ref 6–23)
CALCIUM: 8 mg/dL — AB (ref 8.4–10.5)
CHLORIDE: 104 meq/L (ref 96–112)
CO2: 26 mEq/L (ref 19–32)
CO2: 26 meq/L (ref 19–32)
Calcium: 7.5 mg/dL — ABNORMAL LOW (ref 8.4–10.5)
Chloride: 100 mEq/L (ref 96–112)
Creatinine, Ser: 1.19 mg/dL — ABNORMAL HIGH (ref 0.50–1.10)
Creatinine, Ser: 1.26 mg/dL — ABNORMAL HIGH (ref 0.50–1.10)
GFR calc non Af Amer: 38 mL/min — ABNORMAL LOW (ref >90)
GFR, EST AFRICAN AMERICAN: 47 mL/min — AB (ref >90)
GFR, EST AFRICAN AMERICAN: 44 mL/min — AB (ref >90)
GFR, EST NON AFRICAN AMERICAN: 40 mL/min — AB (ref >90)
Glucose, Bld: 70 mg/dL (ref 70–99)
Glucose, Bld: 124 mg/dL — ABNORMAL HIGH (ref 70–99)
Potassium: 3 mEq/L — ABNORMAL LOW (ref 3.7–5.3)
Potassium: 3 mEq/L — ABNORMAL LOW (ref 3.7–5.3)
SODIUM: 139 meq/L (ref 137–147)
Sodium: 139 mEq/L (ref 137–147)

## 2014-05-17 LAB — GLUCOSE, CAPILLARY
GLUCOSE-CAPILLARY: 80 mg/dL (ref 70–99)
GLUCOSE-CAPILLARY: 69 mg/dL — AB (ref 70–99)
GLUCOSE-CAPILLARY: 93 mg/dL (ref 70–99)
Glucose-Capillary: 67 mg/dL — ABNORMAL LOW (ref 70–99)
Glucose-Capillary: 83 mg/dL (ref 70–99)
Glucose-Capillary: 79 mg/dL (ref 70–99)

## 2014-05-17 LAB — CBC
HCT: 28.9 % — ABNORMAL LOW (ref 36.0–46.0)
HEMATOCRIT: 20.7 % — AB (ref 36.0–46.0)
HEMOGLOBIN: 7 g/dL — AB (ref 12.0–15.0)
HEMOGLOBIN: 9.8 g/dL — AB (ref 12.0–15.0)
MCH: 30.8 pg (ref 26.0–34.0)
MCH: 30.4 pg (ref 26.0–34.0)
MCHC: 33.8 g/dL (ref 30.0–36.0)
MCHC: 33.9 g/dL (ref 30.0–36.0)
MCV: 91.2 fL (ref 78.0–100.0)
MCV: 89.8 fL (ref 78.0–100.0)
Platelets: 147 10*3/uL — ABNORMAL LOW (ref 150–400)
Platelets: 143 10*3/uL — ABNORMAL LOW (ref 150–400)
RBC: 2.27 MIL/uL — ABNORMAL LOW (ref 3.87–5.11)
RBC: 3.22 MIL/uL — ABNORMAL LOW (ref 3.87–5.11)
RDW: 17.8 % — ABNORMAL HIGH (ref 11.5–15.5)
RDW: 16.2 % — AB (ref 11.5–15.5)
WBC: 8.6 10*3/uL (ref 4.0–10.5)
WBC: 9.7 10*3/uL (ref 4.0–10.5)

## 2014-05-17 LAB — URINE CULTURE
COLONY COUNT: NO GROWTH
Culture: NO GROWTH

## 2014-05-17 LAB — PREPARE RBC (CROSSMATCH)

## 2014-05-17 LAB — PROTIME-INR
INR: 6.52 — AB (ref 0.00–1.49)
PROTHROMBIN TIME: 54.4 s — AB (ref 11.6–15.2)

## 2014-05-17 MED ORDER — POTASSIUM CHLORIDE CRYS ER 20 MEQ PO TBCR
40.0000 meq | EXTENDED_RELEASE_TABLET | Freq: Once | ORAL | Status: DC
  Filled 2014-05-17: qty 2

## 2014-05-17 MED ORDER — INSULIN GLARGINE 100 UNIT/ML ~~LOC~~ SOLN: 12.0000 [IU] | Freq: Every day | SUBCUTANEOUS | Status: DC

## 2014-05-17 MED ORDER — ACETAMINOPHEN 325 MG PO TABS
650.0000 mg | ORAL_TABLET | Freq: Four times a day (QID) | ORAL | Status: DC
  Administered 2014-05-17: 650 mg via ORAL
  Filled 2014-05-17 (×6): qty 2

## 2014-05-17 MED ORDER — INSULIN ASPART 100 UNIT/ML ~~LOC~~ SOLN: 0.0000 [IU] | Freq: Three times a day (TID) | SUBCUTANEOUS | Status: DC

## 2014-05-17 MED ORDER — INSULIN GLARGINE 100 UNIT/ML ~~LOC~~ SOLN
15.0000 [IU] | Freq: Every day | SUBCUTANEOUS | Status: DC
  Filled 2014-05-17: qty 0.15

## 2014-05-17 MED ORDER — POTASSIUM CHLORIDE 10 MEQ/50ML IV SOLN
10.0000 meq | INTRAVENOUS | Status: AC
  Administered 2014-05-17 (×3): 10 meq via INTRAVENOUS
  Filled 2014-05-17 (×3): qty 50

## 2014-05-17 MED ORDER — INSULIN ASPART 100 UNIT/ML ~~LOC~~ SOLN: 0.0000 [IU] | SUBCUTANEOUS | Status: DC

## 2014-05-17 MED ORDER — INSULIN ASPART 100 UNIT/ML ~~LOC~~ SOLN
0.0000 [IU] | SUBCUTANEOUS | Status: DC
  Administered 2014-05-18: 1 [IU] via SUBCUTANEOUS

## 2014-05-17 MED ORDER — POTASSIUM CHLORIDE 20 MEQ PO PACK: 40.0000 meq | PACK | Freq: Once | ORAL | Status: DC

## 2014-05-17 MED ORDER — INSULIN GLARGINE 100 UNIT/ML ~~LOC~~ SOLN
7.0000 [IU] | Freq: Every day | SUBCUTANEOUS | Status: DC
  Administered 2014-05-17: 7 [IU] via SUBCUTANEOUS
  Filled 2014-05-17: qty 0.07

## 2014-05-17 MED ORDER — GLUCOSE 40 % PO GEL: 1.0000 | ORAL | Status: DC | PRN

## 2014-05-17 MED ORDER — VITAMIN K1 10 MG/ML IJ SOLN
5.0000 mg | Freq: Once | INTRAVENOUS | Status: AC
  Administered 2014-05-17: 5 mg via INTRAVENOUS
  Filled 2014-05-17: qty 0.5

## 2014-05-17 MED ORDER — KCL IN DEXTROSE-NACL 20-5-0.45 MEQ/L-%-% IV SOLN
INTRAVENOUS | Status: DC
  Administered 2014-05-17 – 2014-05-22 (×8): via INTRAVENOUS
  Filled 2014-05-17 (×7): qty 1000

## 2014-05-17 MED ORDER — TRAMADOL HCL 50 MG PO TABS
50.0000 mg | ORAL_TABLET | Freq: Two times a day (BID) | ORAL | Status: DC
  Administered 2014-05-17 – 2014-05-18 (×2): 50 mg via ORAL
  Filled 2014-05-17 (×2): qty 1

## 2014-05-17 NOTE — Progress Notes (Signed)
Pt plans to dc to [facility] Once medically stable.   [name], [place] covering [place] 05/17/2014 1441pm

## 2014-05-17 NOTE — Progress Notes (Signed)
Completed 2units of blood as ordered, patient tolerated it well, no reaction.

## 2014-05-17 NOTE — Progress Notes (Signed)
  Echocardiogram 2D Echocardiogram has been performed.  Doyle Askew 05/17/2014, 8:43 AM

## 2014-05-17 NOTE — Progress Notes (Signed)
Report given to Horris Latino, RN -charge nurse regarding pt status prior to transfer to telemetry.

## 2014-05-17 NOTE — Progress Notes (Signed)
PT Cancellation Note  Patient Details Name: WINNI EHRHARD MRN: [id number] DOB: [birth year]   Cancelled Treatment:    Reason Eval/Treat Not Completed: Patient not medically ready   Neil Crouch 05/17/2014, 1:12 PM

## 2014-05-17 NOTE — Progress Notes (Signed)
Hypoglycemic Event  CBG: 69  Treatment: 15 GM carbohydrate snack  Symptoms: None  Follow-up CBG: Time:1335 CBG Result:93  Possible Reasons for Event: Inadequate meal intake and Unknown  Comments/MD notified: Dr. Maryruth Hancock  Remember to initiate Hypoglycemia Order Set & complete

## 2014-05-17 NOTE — Consult Note (Addendum)
WOC ostomy follow up Stoma type/location: LLQ Colostomy Stomal assessment/size: 1 and 1/4 inches round in a shallow gulley Peristomal assessment: intact, clear Treatment options for stomal/peristomal skin: Skin barrier ring Output Dark brown/black stool Ostomy pouching: 2pc. 2 and 1/4 inch pouching system with skin barrier ring Education provided: Patient is getting blood and is still short of breath, so she is not participating. Husband observed entire process, from pouch removal, resizing and preparation to application and sealing. Plan is for patient to go from acute care to Rehab facility and from there to home with [facility]. Next pouch change is due on Wednesday, 5/27. Enrolled patient in [place] Start Discharge program: Yes [place] nursing team will follow, and will remain available to this patient, the nursing, surgical and medical teams.   Thanks, Maudie Flakes, MSN, RN, [place], [place], [place] [phone])

## 2014-05-17 NOTE — Progress Notes (Signed)
CRITICAL VALUE ALERT  Critical value received:  INR 6.52  Date of notification:  05/17/14  Time of notification:  0715  Critical value read back:yes  Nurse who received alert:  Hart Carwin, RN  MD notified (1st page):  Dr Cruzita Lederer  Time of first page:  0715  MD notified (2nd page):  Time of second page:  Responding MD:  Cruzita Lederer  Time MD responded:  [phone]

## 2014-05-17 NOTE — Progress Notes (Signed)
Assessment unchanged. Transferred via bed to [place] to room 1439 by nurse and NT. Updated report given to [facility], RN upon arrival to unit.

## 2014-05-17 NOTE — Progress Notes (Signed)
Patient transferred from the 5th floor to 1439, oriented patient to room/unit. Started FFP and potassium as ordered. 2units of blood to be given as well.

## 2014-05-17 NOTE — Progress Notes (Signed)
Patient ID: [name], female   DOB: [birth year], 80 y.o.   MRN: [id number] 5 Days Post-Op Procedure(s) (LRB): RADICAL VULVECTOMY WITH COLOSTOMY (N/A) EXPLORATORY LAPAROTOMY, TOTAL ABDO MINAL HYSTERECTOMY AND BILATERAL SALPINGOOPHERECTOMY (N/A)  Subjective: Patient spitting up scant blood tinged--?sputum  Objective: Vital signs in last 24 hours: Temp:  [97.1 F (36.2 C)-97.8 F (36.6 C)] 97.6 F (36.4 C) (05/24 0923) Pulse Rate:  [71-114] 80 (05/24 0923) Resp:  [16-18] 18 (05/24 0923) BP: (92-146)/(60-78) 92/60 mmHg (05/24 0923) SpO2:  [93 %-100 %] 93 % (05/24 0923) Weight:  [73.2 kg (161 lb 6 oz)] 73.2 kg (161 lb 6 oz) (05/24 0450)    Intake/Output from previous day: 05/23 0701 - 05/24 0700 In: 2122.5 [I.V.:2122.5] Out: 2675 [Urine:2650; Drains:25]  CBG (last 3)   Recent Labs  05/16/14 2353 05/17/14 0351 05/17/14 0751  GLUCAP 145* 83 80   Dg Chest 1 View  05/16/2014   CLINICAL DATA:  Shortness of breath, vomiting  EXAM: CHEST - 1 VIEW  COMPARISON:  05/04/2014  FINDINGS: Mild cardiomegaly. Mild interstitial edema/ infiltrates predominant perihilar, right worse than left. . No definite effusion.  Atheromatous aorta. Spurring in the thoracic spine.  IMPRESSION: 1. Cardiomegaly with asymmetric edema/infiltrates, right worse than left.   Electronically Signed   By: Arne Cleveland M.D.   On: 05/16/2014 11:12   Dg Abd 1 View  05/16/2014   CLINICAL DATA:  Postop APR; nausea, vomiting  EXAM: ABDOMEN - 1 VIEW  COMPARISON:  PET-CT 10/07/2013  FINDINGS: Surgical staples project over the right lower quadrant. Surgical drain in the pelvis. A few gas distended mid abdominal small bowel loops. Normal distribution comparisons will throughout colon. Cortical loss at the superior margin of the right pubic bone.  IMPRESSION: Nonobstructive bowel gas pattern.   Electronically Signed   By: Arne Cleveland M.D.   On: 05/16/2014 11:14   Dg Chest Port 1 View  05/16/2014   CLINICAL DATA:  PICC  placement  EXAM: PORTABLE CHEST - 1 VIEW  COMPARISON:  Earlier film of the same day  FINDINGS: Left arm PICC line extends to the cavoatrial junction. Stable cardiomegaly. Some worsening of bilateral edema/ infiltrates, right greater than left. . No effusion. Surgical clips above the thoracic inlet. Regional bones unremarkable.  IMPRESSION: 1. PICC line to cavoatrial junction. 2. Interval worsening of bilateral edema/infiltrates, right worse than left.   Electronically Signed   By: Arne Cleveland M.D.   On: 05/16/2014 13:31    Physical Examination: General: alert Resp: good air movement Cardio: irregular rhythm GI: soft, non-tender; bowel sounds normal;  incision: clean, dry and intact Extremities: extremities normal, atraumatic, no cyanosis or edema Vaginal Bleeding: minimal; the packing was removed/replaced Stoma pink: gas/tar-like stool in the pouch  Labs: WBC/Hgb/Hct/Plts:  8.6/7.0/20.7/147 (05/24 9562) BUN/Cr/glu/ALT/AST/amyl/lip:  34/1.19/--/--/--/--/-- (05/24 1308)   Assessment:  80 y.o. s/p Procedure(s): RADICAL VULVECTOMY WITH COLOSTOMY EXPLORATORY LAPAROTOMY, TOTAL ABDO MINAL HYSTERECTOMY AND BILATERAL SALPINGOOPHERECTOMY: stable Pain:  Pain is well-controlled on IV pain medicaions  Heme: Anemia: hemoglobin trending down in the setting of the GI bleed/coumadin toxicity  CV: Arrhythmia: chronic atrial fibrilation Hypertension: B/Ps in the low normal range. Current treatment:  metoprolol (Lopressor, Toprol).  GI:  Acute GI bleeding. Gas/stool output in the pouch.  FEN: Hypokalemia  Pulm:  H/o chronic SOB; Mild pulmonary edema.  O2 sats stable on nasal cannula.  Endo: Diabetes mellitus Type II, under inadequate control..  CBGs 40 -260   Neuro: Minimal sundowning--no agitation  Prophylaxis: no pharmacologic  prophylaxis/or bridging in the setting of the GI bleeding  ID:  WBC trending down/afebrile Plan: Encourage ambulation Clear liquid diet Continue  PPI FFP/transfuse 2 unit prbc/Vit K Echo Possible upper endoscopy in 2 days Continue Zosyn  Continue dressing changes BID Replace potassium Serial labs Transfer to telemetry floor    LOS: 5 days    [name] 05/17/2014, 9:29 AM

## 2014-05-17 NOTE — Consult Note (Signed)
[facility] Medical Consultation - Progress Note  [name] [id number]:[id number] DOB: Dec 05, 1926 DOA: 05/12/2014 PCP: Elayne Snare, MD   Requesting physician: Dr. 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 GI bleed - Dr. Watt Climes following. Hemoglobin with further drop, transfuse 2U pRBC 2. IDDM - hypoglycemic episode again, decrease basal insulin further  3. Chronic anticoagulation - on hold for now given #1. She has had CVAs and is currently in A fib. INR 6.5, administer vitamin K this morning and FFP 4. A fib - recently seen by Dr. Marlou Porch pre-op, with moderate risk. She is on Diltiazem 180 mg daily and on Metoprolol 50 mg BID. In the setting of hypotension would hold Diltiazem dose this morning. Continue Metoprolol since it doesn't affect the BP as much. Stop anticoagulation at this point given #1. 2D echo done 5/23 with normal EF, mild AS. Mildy tachycardia last night, observe on telemetry for the next 1-2 days 5. Aortic stenosis - last 2D echo 55-60% with moderate AS (03/2012). No significant change now.  6. Prior CVA with residual left sided weakness 7. S/p radical vulvectomy with colostomy, total abdominal hysterectomy and bilateral salpingoopherectomy for recurrent vulvar cancer - per primary team. Empiric Zosyn.   I will followup again tomorrow. Please contact me if I can be of assistance in the meanwhile. Thank you for this consultation.  Subjective: She feels better, more alert   Physical Exam: Blood pressure 104/68, pulse 71,  temperature 97.8 F (36.6 C), temperature source Oral, resp. rate 18, height 5\' 4"  (1.626 m), weight 73.2 kg (161 lb 6 oz), SpO2 100.00%. Filed Vitals:   05/17/14 0450  BP: 104/68  Pulse: 71  Temp: 97.8 F (36.6 C)  Resp: 18    General:  More alert this morning   Eyes: no scleral icterus, closed left eye, chronic  Neck: no JVD  Cardiovascular: irregular, 3/6 SEM  Respiratory: clear, moves air well, no wheezing  Abdomen: tender to palpation throughout  Skin: no rashes  Musculoskeletal: no edema  Neurologic: moves all 4, mild left sided residual weakness; follows commands well   Labs on Admission:  Basic Metabolic Panel:  Recent Labs Lab 05/14/14 0535 05/15/14 0441 05/16/14 0445 05/16/14 1454 05/17/14 0605  NA 132* 129* 139 138 139  K 4.6 3.8 3.7 3.2* 3.0*  CL 100 97 101 104 104  CO2 20 18* 24 25 26   GLUCOSE 215* 242* 195* 188* 70  BUN 20 18 41* 35* 34*  CREATININE 1.36* 1.14* 1.21* 1.10 1.19*  CALCIUM 8.1* 8.5 8.2* 7.6* 7.5*   Liver Function Tests:  Recent Labs Lab 05/16/14 0445  AST 15  ALT 10  ALKPHOS 61  BILITOT 0.8  PROT 4.9*  ALBUMIN 1.9*   CBC:  Recent Labs Lab 05/15/14 0441 05/16/14 0445 05/16/14 1454 05/16/14 2203 05/17/14 0605  WBC 16.4* 17.5* 11.4* 11.9* 8.6  HGB 12.6 10.1* 8.4* 8.0* 7.0*  HCT 37.0 30.1* 24.8* 23.5* 20.7*  MCV 88.7 90.7 90.2 91.1 91.2  PLT 159 189 161 164 147*   CBG:  Recent Labs Lab 05/16/14 1615  05/16/14 2003 05/16/14 2324 05/16/14 2353 05/17/14 0351  GLUCAP 159* 112* 67* 145* 83    Radiological Exams on Admission: Dg Chest 1 View  05/16/2014   CLINICAL DATA:  Shortness of breath, vomiting  EXAM: CHEST - 1 VIEW  COMPARISON:  05/04/2014  FINDINGS: Mild cardiomegaly. Mild interstitial edema/ infiltrates predominant perihilar, right worse than left. . No definite effusion.  Atheromatous aorta. Spurring in the thoracic spine.  IMPRESSION: 1. Cardiomegaly with asymmetric edema/infiltrates, right worse  than left.   Electronically Signed   By: Arne Cleveland M.D.   On: 05/16/2014 11:12   Dg Abd 1 View  05/16/2014   CLINICAL DATA:  Postop APR; nausea, vomiting  EXAM: ABDOMEN - 1 VIEW  COMPARISON:  PET-CT 10/07/2013  FINDINGS: Surgical staples project over the right lower quadrant. Surgical drain in the pelvis. A few gas distended mid abdominal small bowel loops. Normal distribution comparisons will throughout colon. Cortical loss at the superior margin of the right pubic bone.  IMPRESSION: Nonobstructive bowel gas pattern.   Electronically Signed   By: Arne Cleveland M.D.   On: 05/16/2014 11:14   Dg Chest Port 1 View  05/16/2014   CLINICAL DATA:  PICC placement  EXAM: PORTABLE CHEST - 1 VIEW  COMPARISON:  Earlier film of the same day  FINDINGS: Left arm PICC line extends to the cavoatrial junction. Stable cardiomegaly. Some worsening of bilateral edema/ infiltrates, right greater than left. . No effusion. Surgical clips above the thoracic inlet. Regional bones unremarkable.  IMPRESSION: 1. PICC line to cavoatrial junction. 2. Interval worsening of bilateral edema/infiltrates, right worse than left.   Electronically Signed   By: Arne Cleveland M.D.   On: 05/16/2014 13:31    EKG: Independently reviewed. A fib  Time spent: [facility] Pager [phone]  If 7PM-7AM, please contact night-coverage [email or web address] Password [facility] 05/17/2014, 7:16 AM

## 2014-05-17 NOTE — Progress Notes (Signed)
Pts hgb is 7.0. Dr Delsa Sale notified.  Nelva Nay [place]

## 2014-05-17 NOTE — Progress Notes (Signed)
[name] 9:37 AM  Subjective: Patient without any more nausea or vomiting but did cough up a little dark phlegm but her abdominal pain is better and she is having black stool in her ostomy but no new complaints  Objective: Vital signs stable afebrile no acute distress not examined today hemoglobin slight decrease as BUN and creatinine improved but INR increased significantly  Assessment: Postop subacute upper GI bleeding currently stable  Plan: Discussed with GYN team will hold endoscopy today do to increase INR and they plan to use FFP and happy to proceed with endoscopy early next week to make sure its okay to restart blood thinners and call me sooner if necessary  Jeryl Columbia

## 2014-05-18 LAB — TYPE AND SCREEN
ABO/RH(D): A NEG
Antibody Screen: NEGATIVE
UNIT DIVISION: 0
Unit division: 0

## 2014-05-18 LAB — PROTIME-INR
INR: 1.41 (ref 0.00–1.49)
Prothrombin Time: 16.9 seconds — ABNORMAL HIGH (ref 11.6–15.2)

## 2014-05-18 LAB — GLUCOSE, CAPILLARY
GLUCOSE-CAPILLARY: 57 mg/dL — AB (ref 70–99)
GLUCOSE-CAPILLARY: 103 mg/dL — AB (ref 70–99)
Glucose-Capillary: 100 mg/dL — ABNORMAL HIGH (ref 70–99)
Glucose-Capillary: 125 mg/dL — ABNORMAL HIGH (ref 70–99)
Glucose-Capillary: 168 mg/dL — ABNORMAL HIGH (ref 70–99)
Glucose-Capillary: 184 mg/dL — ABNORMAL HIGH (ref 70–99)
Glucose-Capillary: 86 mg/dL (ref 70–99)
Glucose-Capillary: 96 mg/dL (ref 70–99)

## 2014-05-18 LAB — CBC
HCT: 27.6 % — ABNORMAL LOW (ref 36.0–46.0)
HCT: 27.9 % — ABNORMAL LOW (ref 36.0–46.0)
HCT: 28.9 % — ABNORMAL LOW (ref 36.0–46.0)
HEMOGLOBIN: 9.2 g/dL — AB (ref 12.0–15.0)
Hemoglobin: 9.6 g/dL — ABNORMAL LOW (ref 12.0–15.0)
Hemoglobin: 9.6 g/dL — ABNORMAL LOW (ref 12.0–15.0)
MCH: 31.3 pg (ref 26.0–34.0)
MCH: 29.9 pg (ref 26.0–34.0)
MCH: 30.5 pg (ref 26.0–34.0)
MCHC: 34.8 g/dL (ref 30.0–36.0)
MCHC: 33 g/dL (ref 30.0–36.0)
MCHC: 33.2 g/dL (ref 30.0–36.0)
MCV: 89.9 fL (ref 78.0–100.0)
MCV: 90.6 fL (ref 78.0–100.0)
MCV: 91.7 fL (ref 78.0–100.0)
Platelets: 139 10*3/uL — ABNORMAL LOW (ref 150–400)
Platelets: 143 10*3/uL — ABNORMAL LOW (ref 150–400)
Platelets: 141 10*3/uL — ABNORMAL LOW (ref 150–400)
RBC: 3.07 MIL/uL — ABNORMAL LOW (ref 3.87–5.11)
RBC: 3.08 MIL/uL — ABNORMAL LOW (ref 3.87–5.11)
RBC: 3.15 MIL/uL — ABNORMAL LOW (ref 3.87–5.11)
RDW: 16.6 % — AB (ref 11.5–15.5)
RDW: 16.7 % — ABNORMAL HIGH (ref 11.5–15.5)
RDW: 16.9 % — AB (ref 11.5–15.5)
WBC: 10 10*3/uL (ref 4.0–10.5)
WBC: 10.9 10*3/uL — ABNORMAL HIGH (ref 4.0–10.5)
WBC: 10.2 10*3/uL (ref 4.0–10.5)

## 2014-05-18 LAB — BASIC METABOLIC PANEL
BUN: 29 mg/dL — ABNORMAL HIGH (ref 6–23)
CO2: 29 meq/L (ref 19–32)
Calcium: 8 mg/dL — ABNORMAL LOW (ref 8.4–10.5)
Chloride: 104 mEq/L (ref 96–112)
Creatinine, Ser: 1.26 mg/dL — ABNORMAL HIGH (ref 0.50–1.10)
GFR calc Af Amer: 44 mL/min — ABNORMAL LOW (ref >90)
GFR, EST NON AFRICAN AMERICAN: 38 mL/min — AB (ref >90)
GLUCOSE: 58 mg/dL — AB (ref 70–99)
Potassium: 3.2 mEq/L — ABNORMAL LOW (ref 3.7–5.3)
SODIUM: 143 meq/L (ref 137–147)

## 2014-05-18 LAB — PHOSPHORUS: Phosphorus: 2.7 mg/dL (ref 2.3–4.6)

## 2014-05-18 LAB — MAGNESIUM: MAGNESIUM: 1.3 mg/dL — AB (ref 1.5–2.5)

## 2014-05-18 LAB — PREPARE FRESH FROZEN PLASMA: UNIT DIVISION: 0

## 2014-05-18 MED ORDER — FUROSEMIDE 20 MG PO TABS
20.0000 mg | ORAL_TABLET | Freq: Every day | ORAL | Status: DC
  Administered 2014-05-18 – 2014-05-25 (×8): 20 mg via ORAL
  Filled 2014-05-18 (×8): qty 1

## 2014-05-18 MED ORDER — INSULIN GLARGINE 100 UNIT/ML ~~LOC~~ SOLN
5.0000 [IU] | Freq: Every day | SUBCUTANEOUS | Status: DC
  Administered 2014-05-18: 5 [IU] via SUBCUTANEOUS
  Filled 2014-05-18: qty 0.05

## 2014-05-18 MED ORDER — POTASSIUM CHLORIDE CRYS ER 10 MEQ PO TBCR
10.0000 meq | EXTENDED_RELEASE_TABLET | Freq: Two times a day (BID) | ORAL | Status: DC
  Administered 2014-05-18 – 2014-05-21 (×8): 10 meq via ORAL
  Filled 2014-05-18 (×10): qty 1

## 2014-05-18 MED ORDER — TRAMADOL HCL 50 MG PO TABS: 50.0000 mg | ORAL_TABLET | Freq: Four times a day (QID) | ORAL | Status: DC

## 2014-05-18 MED ORDER — ACETAMINOPHEN 500 MG PO TABS
1000.0000 mg | ORAL_TABLET | Freq: Four times a day (QID) | ORAL | Status: DC
  Administered 2014-05-18 – 2014-05-25 (×20): 1000 mg via ORAL
  Filled 2014-05-18 (×36): qty 2

## 2014-05-18 MED ORDER — ATORVASTATIN CALCIUM 20 MG PO TABS
20.0000 mg | ORAL_TABLET | Freq: Every day | ORAL | Status: DC
  Administered 2014-05-18 – 2014-05-25 (×7): 20 mg via ORAL
  Filled 2014-05-18 (×8): qty 1

## 2014-05-18 MED ORDER — GLUCERNA SHAKE PO LIQD
237.0000 mL | Freq: Two times a day (BID) | ORAL | Status: DC
  Administered 2014-05-18 – 2014-05-21 (×5): 237 mL via ORAL
  Filled 2014-05-18 (×8): qty 237

## 2014-05-18 MED ORDER — TRAMADOL HCL 50 MG PO TABS
50.0000 mg | ORAL_TABLET | Freq: Four times a day (QID) | ORAL | Status: DC
  Administered 2014-05-18 – 2014-05-25 (×27): 50 mg via ORAL
  Filled 2014-05-18 (×28): qty 1

## 2014-05-18 MED ORDER — LEVOTHYROXINE SODIUM 100 MCG PO TABS
100.0000 ug | ORAL_TABLET | ORAL | Status: DC
  Administered 2014-05-19 – 2014-05-25 (×6): 100 ug via ORAL
  Filled 2014-05-18 (×8): qty 1

## 2014-05-18 NOTE — Progress Notes (Signed)
[facility] Medical Consultation - Progress Note  [name] WUX:[id number] DOB: 07-Sep-1926 DOA: 05/12/2014 PCP: Elayne Snare, MD   Requesting physician: Dr. 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 GI bleed - Dr. Watt Climes following. Hemoglobin stable over the past day, plan for possible scope this week. Would favor holding anticoagulation for today. INR better. 2. IDDM - continue current regimen.  3. Chronic anticoagulation - on hold for now given #1. She has had CVAs and is currently in A fib.  4. A fib - recently seen by Dr. Marlou Porch pre-op, with moderate risk. She is on Diltiazem 180 mg daily and on Metoprolol 50 mg BID. In the setting of hypotension would hold Diltiazem dose this morning. Continue Metoprolol since it doesn't affect the BP as much. 2D echo done 5/23 with normal EF, mild AS. On tele. If no events d/c tele 5/26 5. Aortic stenosis - last 2D echo 55-60% with moderate AS (03/2012). No significant change now.  6. Prior CVA with residual left sided weakness 7. S/p radical vulvectomy with colostomy, total abdominal hysterectomy and bilateral salpingoopherectomy for recurrent vulvar cancer - per primary team. Empiric Zosyn.   I will followup again tomorrow. Please contact me if I can be of assistance in the meanwhile. Thank you for this consultation.  Subjective: She feels better  Physical Exam: Blood pressure 108/63, pulse 94, temperature 98.3 F (36.8 C), temperature source Oral, resp. rate 16, height 5\' 4"  (1.626 m),  weight 70.6 kg (155 lb 10.3 oz), SpO2 99.00%. Filed Vitals:   05/18/14 0555  BP: 108/63  Pulse: 94  Temp: 98.3 F (36.8 C)  Resp: 16    General:  More alert this morning   Eyes: no scleral icterus, closed left eye, chronic  Neck: no JVD  Cardiovascular: irregular, 3/6 SEM  Respiratory: clear, moves air well, no wheezing  Abdomen: mildly tender  Skin: no rashes  Musculoskeletal: no edema  Neurologic: moves all 4, mild left sided residual weakness; follows commands well  Labs on Admission:  Basic Metabolic Panel:  Recent Labs Lab 05/16/14 0445 05/16/14 1454 05/17/14 0605 05/17/14 2200 05/18/14 0535  NA 139 138 139 139 143  K 3.7 3.2* 3.0* 3.0* 3.2*  CL 101 104 104 100 104  CO2 24 25 26 26 29   GLUCOSE 195* 188* 70 124* 58*  BUN 41* 35* 34* 31* 29*  CREATININE 1.21* 1.10 1.19* 1.26* 1.26*  CALCIUM 8.2* 7.6* 7.5* 8.0* 8.0*  MG  --   --   --   --  1.3*  PHOS  --   --   --   --  2.7   Liver Function Tests:  Recent Labs Lab 05/16/14 0445  AST 15  ALT 10  ALKPHOS 61  BILITOT 0.8  PROT 4.9*  ALBUMIN 1.9*   CBC:  Recent Labs Lab 05/16/14 1454 05/16/14 2203 05/17/14 0605 05/17/14 2150 05/18/14 0535  WBC 11.4* 11.9* 8.6 9.7 10.0  HGB 8.4* 8.0* 7.0* 9.8* 9.6*  HCT 24.8* 23.5* 20.7* 28.9* 27.6*  MCV 90.2 91.1 91.2 89.8 89.9  PLT  161 164 147* 143* 139*   CBG:  Recent Labs Lab 05/17/14 1244 05/17/14 1335 05/17/14 1717 05/18/14 0003 05/18/14 0409  GLUCAP 69* 93 79 96 86    Radiological Exams on Admission: Dg Chest 1 View  05/16/2014   CLINICAL DATA:  Shortness of breath, vomiting  EXAM: CHEST - 1 VIEW  COMPARISON:  05/04/2014  FINDINGS: Mild cardiomegaly. Mild interstitial edema/ infiltrates predominant perihilar, right worse than left. . No definite effusion.  Atheromatous aorta. Spurring in the thoracic spine.  IMPRESSION: 1. Cardiomegaly with asymmetric edema/infiltrates, right worse than left.   Electronically Signed   By: Arne Cleveland  M.D.   On: 05/16/2014 11:12   Dg Abd 1 View  05/16/2014   CLINICAL DATA:  Postop APR; nausea, vomiting  EXAM: ABDOMEN - 1 VIEW  COMPARISON:  PET-CT 10/07/2013  FINDINGS: Surgical staples project over the right lower quadrant. Surgical drain in the pelvis. A few gas distended mid abdominal small bowel loops. Normal distribution comparisons will throughout colon. Cortical loss at the superior margin of the right pubic bone.  IMPRESSION: Nonobstructive bowel gas pattern.   Electronically Signed   By: Arne Cleveland M.D.   On: 05/16/2014 11:14   Dg Chest Port 1 View  05/16/2014   CLINICAL DATA:  PICC placement  EXAM: PORTABLE CHEST - 1 VIEW  COMPARISON:  Earlier film of the same day  FINDINGS: Left arm PICC line extends to the cavoatrial junction. Stable cardiomegaly. Some worsening of bilateral edema/ infiltrates, right greater than left. . No effusion. Surgical clips above the thoracic inlet. Regional bones unremarkable.  IMPRESSION: 1. PICC line to cavoatrial junction. 2. Interval worsening of bilateral edema/infiltrates, right worse than left.   Electronically Signed   By: Arne Cleveland M.D.   On: 05/16/2014 13:31    EKG: Independently reviewed. A fib  Time spent: [place] [facility] Pager [phone]  If 7PM-7AM, please contact night-coverage [email or web address] Password [facility] 05/18/2014, 7:51 AM

## 2014-05-18 NOTE — Progress Notes (Signed)
Physical Therapy Treatment Patient Details Name: [name] MRN: [id number] DOB: [birth year] Today's Date: 05/18/2014    History of Present Illness  admitted 05/12/14 with recurrent vulva cancer. S/P abdominal perineum resection,TAH, diverting colostomy on 05/12/14. Post op hypotension.    PT Comments    Pt in bed c/o a lot pain L LQ ABD prior to session.  RN aware.  Assisted pt to EOB with increased time.  Amb in hallway twice with one sitting rest break.  Pt progressing slowly.  Follow Up Recommendations  SNF ([facility])     Equipment Recommendations       Recommendations for Other Services       Precautions / Restrictions Precautions Precautions: Fall Precaution Comments: abdominal drain, colostomy, low BP Restrictions Weight Bearing Restrictions: No    Mobility  Bed Mobility Overal bed mobility: Needs Assistance;+ 2 for safety/equipment Bed Mobility: Rolling;Sidelying to Sit Rolling: Max assist Sidelying to sit: Max assist;+2 for safety/equipment Supine to sit: +2 for safety/equipment     General bed mobility comments: increased time esp with upper body due to ABD pain  Transfers Overall transfer level: Needs assistance Equipment used: Rolling walker (2 wheeled) Transfers: Sit to/from Stand Sit to Stand: +2 safety/equipment;Max assist Stand pivot transfers: +2 physical assistance;+2 safety/equipment;Max assist       General transfer comment: 50% VC's on proper tech and hand placement  Ambulation/Gait Ambulation/Gait assistance: +2 safety/equipment;+2 physical assistance;Min assist;Mod assist Ambulation Distance (Feet): 40 Feet (20 feet x 2) Assistive device: Rolling walker (2 wheeled) Gait Pattern/deviations: Step-to pattern;Step-through pattern;Trunk flexed Gait velocity: decreased   General Gait Details: amb on RA sats avg 96%  HR 122.  75% VC's for direction.  C/o 9/10 L LQ ABD pain.  RN aware.   Amb twice with one sitting rest break.   Stairs             Wheelchair Mobility    Modified Rankin (Stroke Patients Only)       Balance                                    Cognition                            Exercises      General Comments        Pertinent Vitals/Pain 9/10 L LQ ABD pain RN aware    Home Living                      Prior Function            PT Goals (current goals can now be found in the care plan section) Progress towards PT goals: Progressing toward goals    Frequency  Min 3X/week    PT Plan      Co-evaluation             End of Session Equipment Utilized During Treatment: Gait belt Activity Tolerance: Patient limited by fatigue;Patient limited by pain Patient left: in chair;with call bell/phone within reach;with nursing/sitter in room     Time: 8315-1761 PT Time Calculation (min): 38 min  Charges:  $Gait Training: 23-37 mins $Therapeutic Activity: 8-22 mins                    G Codes:      Antonie Borjon  PTA [employer]  Acute  Rehab Pager      [phone]

## 2014-05-18 NOTE — Progress Notes (Signed)
Tanja Port 9:29 AM  Subjective:  Patient still coughing up a little phlegm and seems more short of breath to me and her only complaint is difficulty getting her legs comfortable without GI complaints or signs of bleeding and tolerating clear liquids  Objective: Vital signs stable afebrile no acute distress abdomen is soft nontender hemoglobin BUN and creatinine all stable decreased INR  Assessment: No further GI bleeding currently  Plan: Consider repeat chest x-ray or other workup for shortness of breath and will proceed with endoscopy later this week to rule out any significant lesions and make sure it is okay to resume anticoagulation if needed but Lovenox okay with me for now  Jeryl Columbia

## 2014-05-18 NOTE — Progress Notes (Signed)
Patient ID: [name], female   DOB: 10-Apr-1926, 80 y.o.   MRN: [id number] 6 Days Post-Op Procedure(s) (LRB): RADICAL VULVECTOMY WITH COLOSTOMY (N/A) EXPLORATORY LAPAROTOMY, TOTAL ABDO MINAL HYSTERECTOMY AND BILATERAL SALPINGOOPHERECTOMY (N/A)  Subjective: Patient c/o heartburn, pain with swallowing  Objective: Vital signs in last 24 hours: Temp:  [97.3 F (36.3 C)-98.3 F (36.8 C)] 98.3 F (36.8 C) (05/25 0555) Pulse Rate:  [86-106] 94 (05/25 0555) Resp:  [12-20] 16 (05/25 0555) BP: (105-131)/(50-71) 108/63 mmHg (05/25 0555) SpO2:  [99 %-100 %] 99 % (05/25 0555) Weight:  [70.6 kg (155 lb 10.3 oz)] 70.6 kg (155 lb 10.3 oz) (05/25 0555)    Intake/Output from previous day: 05/24 0701 - 05/25 0700 In: 1540 [P.O.:120; I.V.:280; Blood:840; IV [id number]:591] Out: 6384 [Urine:3100; Drains:50; Stool:25]  CBG (last 3)   Recent Labs  05/18/14 0803 05/18/14 0845 05/18/14 1103  GLUCAP 57* 100* 103*   No results found.  Physical Examination: General: alert Resp: good air movement Cardio: irregular rhythm GI: soft, non-tender; bowel sounds normal;  incision: clean, dry and intact Extremities: extremities normal, atraumatic, no cyanosis or edema Vaginal Bleeding: minimal; the packing was removed/replaced Stoma obscured by tar-like stool in the pouch  Labs: WBC/Hgb/Hct/Plts:  10.9/9.2/27.9/143 (05/25 1500) BUN/Cr/glu/ALT/AST/amyl/lip:  29/1.26/--/--/--/--/-- (05/25 0535)  CBG (last 3)   Recent Labs  05/18/14 0803 05/18/14 0845 05/18/14 1103  GLUCAP 57* 100* 103*     Assessment:  80 y.o. s/p Procedure(s): RADICAL VULVECTOMY WITH COLOSTOMY EXPLORATORY LAPAROTOMY, TOTAL ABDO MINAL HYSTERECTOMY AND BILATERAL SALPINGOOPHERECTOMY: stable Pain:  Pain is well-controlled on oral pain medicaions  Heme: Anemia: hemoglobin trending up post transfusion CV: Arrhythmia: chronic atrial fibrilation Hypertension: B/Ps in the low normal range. Current treatment:  metoprolol  (Lopressor, Toprol).  GI:  Acute GI bleeding--resolving/coagulopathy corrected  FEN: Hypokalemia--mild  Pulm:  H/o chronic SOB; pulmonary status stable  Endo: Diabetes mellitus Type II, under adequate control..  Still some hypoglycemic episodes with CBGs <70    Prophylaxis: no pharmacologic prophylaxis/or bridging in the setting of the GI bleeding  ID:  WBC normal on antibiotics Plan: Encourage ambulation Full liquid diet Continue to adjust insulin regimen Continue PPI Continue Zosyn  Continue dressing changes BID Replace potassium Serial labs     LOS: 6 days    [name] 05/18/2014, 3:19 PM

## 2014-05-19 LAB — CBC
HCT: 28.9 % — ABNORMAL LOW (ref 36.0–46.0)
Hemoglobin: 9.6 g/dL — ABNORMAL LOW (ref 12.0–15.0)
MCH: 30.8 pg (ref 26.0–34.0)
MCHC: 33.2 g/dL (ref 30.0–36.0)
MCV: 92.6 fL (ref 78.0–100.0)
Platelets: 137 K/uL — ABNORMAL LOW (ref 150–400)
RBC: 3.12 MIL/uL — ABNORMAL LOW (ref 3.87–5.11)
RDW: 17 % — ABNORMAL HIGH (ref 11.5–15.5)
WBC: 9.3 K/uL (ref 4.0–10.5)

## 2014-05-19 LAB — GLUCOSE, CAPILLARY
GLUCOSE-CAPILLARY: 159 mg/dL — AB (ref 70–99)
GLUCOSE-CAPILLARY: 166 mg/dL — AB (ref 70–99)
Glucose-Capillary: 144 mg/dL — ABNORMAL HIGH (ref 70–99)
Glucose-Capillary: 139 mg/dL — ABNORMAL HIGH (ref 70–99)
Glucose-Capillary: 176 mg/dL — ABNORMAL HIGH (ref 70–99)

## 2014-05-19 LAB — BASIC METABOLIC PANEL
BUN: 26 mg/dL — ABNORMAL HIGH (ref 6–23)
CHLORIDE: 102 meq/L (ref 96–112)
CO2: 28 mEq/L (ref 19–32)
CREATININE: 1.45 mg/dL — AB (ref 0.50–1.10)
Calcium: 8 mg/dL — ABNORMAL LOW (ref 8.4–10.5)
GFR calc non Af Amer: 32 mL/min — ABNORMAL LOW (ref >90)
GFR, EST AFRICAN AMERICAN: 37 mL/min — AB (ref >90)
Glucose, Bld: 160 mg/dL — ABNORMAL HIGH (ref 70–99)
Potassium: 3.5 mEq/L — ABNORMAL LOW (ref 3.7–5.3)
SODIUM: 141 meq/L (ref 137–147)

## 2014-05-19 MED ORDER — DILTIAZEM HCL ER COATED BEADS 120 MG PO CP24
120.0000 mg | ORAL_CAPSULE | Freq: Every day | ORAL | Status: DC
  Administered 2014-05-19 – 2014-05-25 (×7): 120 mg via ORAL
  Filled 2014-05-19 (×7): qty 1

## 2014-05-19 MED ORDER — INSULIN GLARGINE 100 UNIT/ML ~~LOC~~ SOLN
5.0000 [IU] | Freq: Every day | SUBCUTANEOUS | Status: DC
  Administered 2014-05-19 – 2014-05-22 (×3): 5 [IU] via SUBCUTANEOUS
  Filled 2014-05-19 (×4): qty 0.05

## 2014-05-19 MED ORDER — INSULIN ASPART 100 UNIT/ML ~~LOC~~ SOLN
0.0000 [IU] | Freq: Three times a day (TID) | SUBCUTANEOUS | Status: DC
  Administered 2014-05-19 (×2): 2 [IU] via SUBCUTANEOUS
  Administered 2014-05-20 (×2): 1 [IU] via SUBCUTANEOUS
  Administered 2014-05-21 (×2): 2 [IU] via SUBCUTANEOUS
  Administered 2014-05-21 – 2014-05-22 (×2): 3 [IU] via SUBCUTANEOUS

## 2014-05-19 NOTE — Progress Notes (Signed)
Patient ID: [name], female   DOB: Jan 29, 1926, 80 y.o.   MRN: [id number] 7 Days Post-Op Procedure(s) (LRB): RADICAL VULVECTOMY WITH COLOSTOMY (N/A) EXPLORATORY LAPAROTOMY, TOTAL ABDO MINAL HYSTERECTOMY AND BILATERAL SALPINGOOPHERECTOMY (N/A)  Subjective: Patient w/o complaints  Objective: Vital signs in last 24 hours: Temp:  [97.6 F (36.4 C)-97.7 F (36.5 C)] 97.6 F (36.4 C) (05/26 0438) Pulse Rate:  [92-97] 97 (05/26 0438) Resp:  [16-20] 16 (05/26 0438) BP: (108-116)/(66-68) 110/68 mmHg (05/26 0438) SpO2:  [95 %-100 %] 95 % (05/26 0438) Weight:  [68.7 kg (151 lb 7.3 oz)] 68.7 kg (151 lb 7.3 oz) (05/26 0438)    Intake/Output from previous day: 05/25 0701 - 05/26 0700 In: 710 [P.O.:480; I.V.:80; IV Piggyback:150] Out: 640 [Urine:500; Drains:140]  CBG (last 3)   Recent Labs  05/19/14 0437 05/19/14 0732 05/19/14 1147  GLUCAP 144* 139* 159*   No results found.  Physical Examination: General: alert Resp: good air movement Cardio: irregular rhythm GI: soft, non-tender; bowel sounds normal;  incision: clean, dry and intact Extremities: extremities normal, atraumatic, no cyanosis or edema Vaginal Bleeding: minimal; the packing was removed/replaced Stoma obscured by tar-like stool in the pouch  Labs: WBC/Hgb/Hct/Plts:  9.3/9.6/28.9/137 (05/26 0535) BUN/Cr/glu/ALT/AST/amyl/lip:  26/1.45/--/--/--/--/-- (05/26 0535)  CBG (last 3)   Recent Labs  05/19/14 0437 05/19/14 0732 05/19/14 1147  GLUCAP 144* 139* 159*     Assessment:  80 y.o. s/p Procedure(s): RADICAL VULVECTOMY WITH COLOSTOMY EXPLORATORY LAPAROTOMY, TOTAL ABDO MINAL HYSTERECTOMY AND BILATERAL SALPINGOOPHERECTOMY: stable Pain:  Pain is well-controlled on oral pain medicaions  Heme: Anemia: hemoglobin stable  CV: Arrhythmia: chronic atrial fibrilation Hypertension: B/Ps in the low normal range. Current treatment:  metoprolol (Lopressor, Toprol).  GI:  Acute GI bleeding--resolved  FEN:  Electrolytes stable  Renal: Slightly increased creatinine--?decreased intravascular volume  Pulm:  H/o chronic SOB; pulmonary status stable  Endo: Diabetes mellitus Type II, under adequate control.[name]     Prophylaxis: SCDs  ID:  WBC normal on antibiotics Plan: Encourage ambulation Regular diet for now--NPO prior to endoscopy tomorrow Continue to adjust insulin regimen Continue PPI Continue Zosyn  Continue dressing changes BID Serial labs     LOS: 7 days    [name] 05/19/2014, 12:50 PM

## 2014-05-19 NOTE — Progress Notes (Signed)
No urine output, patient no complaint  During the shift. , bladder scan done ,retaining 622cc. Called Dr. Cruzita Lederer ordered to In and Out cath patient. Resistance was  felt while advancing the cath  , waited until urine flow noted.. drained 350cc of urine.

## 2014-05-19 NOTE — Progress Notes (Signed)
Physical Therapy Treatment Patient Details Name: [name] MRN: [id number] DOB: 07/05/1926 Today's Date: 05/19/2014    History of Present Illness  admitted 05/12/14 with recurrent vulva cancer. S/P abdominal perineum resection,TAH, diverting colostomy on 05/12/14. Post op hypotension.    PT Comments    Pt requires MAX encouragement to increase self assist.  Assisted OOB to [facility] for attempted void (no results).  Amb in hallway twice with one sitting rest break.  Pt c/o MAX weakness/fatigue.  Positioned in recliner to comfort.    Follow Up Recommendations  SNF     Equipment Recommendations  None recommended by PT    Recommendations for Other Services       Precautions / Restrictions Precautions Precaution Comments: abdominal drain, colostomy Restrictions Weight Bearing Restrictions: No    Mobility  Bed Mobility Overal bed mobility: Needs Assistance;+ 2 for safety/equipment Bed Mobility: Rolling;Sidelying to Sit Rolling: Max assist Sidelying to sit: Max assist;+2 for safety/equipment       General bed mobility comments: increased time esp with upper body due to ABD pain  Transfers Overall transfer level: Needs assistance Equipment used: Rolling walker (2 wheeled) Transfers: Sit to/from Stand Sit to Stand: +2 safety/equipment;Max assist Stand pivot transfers: +2 physical assistance;+2 safety/equipment;Max assist       General transfer comment: 50% VC's on proper tech and hand placement as we assisted OOB to [facility] then to standing.    Ambulation/Gait Ambulation/Gait assistance: +2 physical assistance;+2 safety/equipment;Min assist;Mod assist Ambulation Distance (Feet): 42 Feet (22, 20 feet one sitting rest break) Assistive device: Rolling walker (2 wheeled) Gait Pattern/deviations: Step-to pattern;Step-through pattern;Trunk flexed Gait velocity: decreased   General Gait Details: pt required increased time and VC's for proper walker to self distance and safety  negociating around obsticles.  Spouse assisted by following with recliner.     Stairs            Wheelchair Mobility    Modified Rankin (Stroke Patients Only)       Balance                                    Cognition                            Exercises      General Comments        Pertinent Vitals/Pain C/o ABD pain "soreness"    Home Living                      Prior Function            PT Goals (current goals can now be found in the care plan section) Progress towards PT goals: Progressing toward goals    Frequency  Min 3X/week    PT Plan      Co-evaluation             End of Session Equipment Utilized During Treatment: Gait belt Activity Tolerance: Patient limited by fatigue;Patient limited by pain Patient left: in chair;with call bell/phone within reach;with nursing/sitter in room     Time: 1638-4665 PT Time Calculation (min): 24 min  Charges:  $Gait Training: 8-22 mins $Therapeutic Activity: 8-22 mins                    G Codes:      Eryn Krejci  PTA [employer]  Acute  Rehab Pager      [phone]

## 2014-05-19 NOTE — Progress Notes (Signed)
[facility] Medical Consultation - Progress Note  [name]:[id number] DOB: 12-08-1926 DOA: 05/12/2014 PCP: Elayne Snare, MD   Requesting physician: Dr. 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 GI bleed - Dr. Watt Climes following. Hemoglobin stable over the past day, plan for scope tomorrow. Would favor holding anticoagulation until then. INR better. 2. IDDM - continue current regimen.  3. Chronic anticoagulation - on hold for now given #1. She has had CVAs and is currently in A fib.  4. A fib - recently seen by Dr. Marlou Porch pre-op, with moderate risk. She is on Diltiazem 180 mg daily and on Metoprolol 50 mg BID. In the setting of hypotension held Diltiazem initially. Continued Metoprolol since it doesn't affect the BP as much and diltiazem had to be restarted on 5/26 due to intermittent tachycardia. 2D echo done 5/23 with normal EF, mild AS. 5. Aortic stenosis - last 2D echo 55-60% with moderate AS (03/2012). No significant change now.  6. Prior CVA with residual left sided weakness 7. S/p radical vulvectomy with colostomy, total abdominal hysterectomy and bilateral salpingoopherectomy for recurrent vulvar cancer - per primary team. Empiric Zosyn.   I will followup again tomorrow. Please contact me if I can be of assistance in the meanwhile. Thank you for this consultation.  Subjective: She feels better  Physical Exam: Blood pressure 110/68, pulse 97, temperature 97.6 F (36.4 C), temperature source Oral, resp. rate 16, height 5'  4" (1.626 m), weight 68.7 kg (151 lb 7.3 oz), SpO2 95.00%. Filed Vitals:   05/19/14 0438  BP: 110/68  Pulse: 97  Temp: 97.6 F (36.4 C)  Resp: 16    General:  More alert this morning, eating breakfast  Eyes: no scleral icterus, closed left eye, chronic  Neck: no JVD  Cardiovascular: irregular, 3/6 SEM  Respiratory: clear, moves air well, no wheezing  Abdomen: mildly tender  Skin: no rashes  Musculoskeletal: no edema  Neurologic: moves all 4, mild left sided residual weakness; follows commands well  Labs on Admission:  Basic Metabolic Panel:  Recent Labs Lab 05/16/14 1454 05/17/14 0605 05/17/14 2200 05/18/14 0535 05/19/14 0535  NA 138 139 139 143 141  K 3.2* 3.0* 3.0* 3.2* 3.5*  CL 104 104 100 104 102  CO2 25 26 26 29 28   GLUCOSE 188* 70 124* 58* 160*  BUN 35* 34* 31* 29* 26*  CREATININE 1.10 1.19* 1.26* 1.26* 1.45*  CALCIUM 7.6* 7.5* 8.0* 8.0* 8.0*  MG  --   --   --  1.3*  --   PHOS  --   --   --  2.7  --    Liver Function Tests:  Recent Labs Lab 05/16/14 0445  AST 15  ALT 10  ALKPHOS 61  BILITOT 0.8  PROT 4.9*  ALBUMIN 1.9*   CBC:  Recent Labs Lab 05/17/14 2150 05/18/14 0535 05/18/14 1500 05/18/14 2155 05/19/14 0535  WBC 9.7 10.0 10.9* 10.2 9.3  HGB 9.8* 9.6* 9.2* 9.6* 9.6*  HCT 28.9* 27.6* 27.9* 28.9* 28.9*  MCV 89.8 89.9 90.6 91.7 92.6  PLT  143* 139* 143* 141* 137*   CBG:  Recent Labs Lab 05/18/14 2018 05/18/14 2359 05/19/14 0437 05/19/14 0732 05/19/14 1147  GLUCAP 184* 168* 144* 139* 159*    Radiological Exams on Admission: No results found.  EKG: Independently reviewed. A fib  Time spent: [facility] Pager [phone]  If 7PM-7AM, please contact night-coverage [email or web address] Password [email or web address] 05/19/2014, 1:11 PM

## 2014-05-19 NOTE — Evaluation (Signed)
Clinical/Bedside Swallow Evaluation Patient Details  Name: [name] MRN: [id number] Date of Birth: 07/02/1926  Today's Date: 05/19/2014 Time: 1300-1340 SLP Time Calculation (min): 40 min  Past Medical History:  Past Medical History  Diagnosis Date  . Hypertension   . Hyperlipidemia   . Obesity   . Hypothyroidism   . Vulvar cancer, carcinoma 11/03/2011  . Atrial fibrillation   . Cancer 2007    vulva-invasive well diff squamous cell ca  . CVA (cerebral infarction)   . Dyslipidemia   . History of radiation therapy 10/20/2013-12/01/2013    60 gray to perineum area and lower pelvis  . Diabetes mellitus 05-04-14    "Diabetic coma" 1 week ago -tx. by EMT [facility] residence until pt. feeling improved and stable.  . Shortness of breath     occ. with coughing up phelgm usually mornings  . 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 Abnormal voice 05-04-14    "raspy voice" with inspiratory effort-Dr. Lissa Hoard aware 05-04-14  . Stroke 05/2000    right brain CVA pre H&P 2001-   Past Surgical History:  Past Surgical History  Procedure Laterality Date  . Vulvar lesion removal  2012  . Breast surgery      Lumpectomy  . Dilation and curettage of uterus    . Cystourethroplasty / ureteroneocystostomy    . Radical vulvectomy    . Lymphadenectomy  2007    R inguinal femoral  . Radical vulvectomy  02/27/12  . Vulvar lesion removal  02/27/2012    Procedure: VULVAR LESION;  Surgeon: Alvino Chapel, MD;  Location: [facility];  Service: Gynecology;  Laterality: N/A;  . Right carotid endarterctomy    . Left eye surgery      , permanently stitched eye together  . Vulvectomy N/A 05/12/2014    Procedure: RADICAL VULVECTOMY WITH COLOSTOMY;  Surgeon: Alvino Chapel, MD;  Location: [facility];  Service: Gynecology;  Laterality: N/A;  . Laparotomy N/A 05/12/2014    Procedure: EXPLORATORY LAPAROTOMY, TOTAL ABDO MINAL HYSTERECTOMY AND BILATERAL SALPINGOOPHERECTOMY;  Surgeon: Alvino Chapel, MD;  Location: [facility];  Service: Gynecology;  Laterality: N/A;   HPI:  RADICAL VULVECTOMY WITH COLOSTOMY,  EXPLORATORY LAPAROTOMY, TOTAL ABDO MINAL HYSTERECTOMY AND BILATERAL SALPINGOOPHERECTOMY  recurrent vulvular carcinoma, PMH of CVA's with dysphagia, requiring PEG placement 05/01/12.   SLP swallow evaluation ordered secondary to patient complaint of difficulty swallowing, especially with pills.   Assessment / Plan / Recommendation Clinical Impression  Patient did not exhibit s/s of oropharyngeal swallow function, with timely initiation of the swallow, laryngeal elevation palpated during the swallow, no cough or throat clearing noted, and no change in vocal quality after swallows.  Patient's voice is, however, very breathy, with decreased breath support for speech,? s/p intubation?  Currenlty, patient's CXR reveals  Interval worsening of bilateral edema/infiltrates, right worse than left.  Lungs sounds:  Rhonchi/diminished. Objective swallow evaluation is needed if MD is concerned  patient is silently aspirating.  GI is following and is planning Endoscopy tomorrow.  SLP will f/u.    Aspiration Risk  Mild    Diet Recommendation Regular;Thin liquid   Liquid Administration via: Cup;Straw Medication Administration: Crushed with puree Supervision: Staff to assist with self feeding Compensations: Slow rate;Small sips/bites Postural Changes and/or Swallow Maneuvers: Seated upright 90 degrees;Upright 30-60 min after meal    Other  Recommendations Recommended Consults: MBS Oral Care Recommendations: Oral care BID;Staff/trained caregiver to provide oral care Other Recommendations: Clarify dietary  restrictions   Follow Up Recommendations       Frequency and Duration        Pertinent Vitals/Pain LS: Rhonchi; CXR: bilateral infiltrates, right greater than left; ? Silent aspiration         Swallow Study Prior Functional Status       General HPI: RADICAL VULVECTOMY WITH  COLOSTOMY,  EXPLORATORY LAPAROTOMY, TOTAL ABDO MINAL HYSTERECTOMY AND BILATERAL SALPINGOOPHERECTOMY  recurrent vulvular carcinoma, PMH of CVA's with dysphagia, requiring PEG placement 05/01/12.   SLP swallow evaluation ordered secondary to patient complaint of difficulty swallowing, especially with pills. Type of Study: Bedside swallow evaluation Previous Swallow Assessment: MBS 05/10/12 & 04/22/12, with PEG placed 05/01/12 Diet Prior to this Study: Regular;Thin liquids Temperature Spikes Noted: No Respiratory Status: Nasal cannula History of Recent Intubation: Yes Length of Intubations (days): 1 days Date extubated: 05/12/14 Behavior/Cognition: Alert;Cooperative;Pleasant mood Oral Cavity - Dentition: Dentures, top (Does not wear lower dentures) Self-Feeding Abilities: Able to feed self Patient Positioning: Upright in bed Baseline Vocal Quality: Breathy;Low vocal intensity Volitional Cough: Weak Volitional Swallow: Able to elicit    Oral/Motor/Sensory Function Overall Oral Motor/Sensory Function: Appears within functional limits for tasks assessed   Ice Chips Ice chips: Within functional limits Presentation: Spoon   Thin Liquid Thin Liquid: Within functional limits Presentation: Cup;Straw    Nectar Thick Nectar Thick Liquid: Not tested   Honey Thick Honey Thick Liquid: Not tested   Puree Puree: Within functional limits Presentation: Spoon   Solid   GO    Solid: Within functional limits       Joaquim Nam 05/19/2014,2:35 PM

## 2014-05-19 NOTE — Progress Notes (Signed)
[name] 11:55 AM  Subjective: Patient doing much better with less shortness of breath and eating okay without signs of bleeding and no abdominal complaint  Objective: Vital signs stable afebrile no acute distress abdomen is soft nontender hemoglobin stable greenish dark stool in her ostomy  Assessment: Upper GI bleeding  Plan: Will proceed with endoscopy tomorrow at 1 PM and I rediscussed the procedure with the patient and her husband and they are in agreement  Jeryl Columbia

## 2014-05-19 NOTE — Progress Notes (Signed)
CBG 57, patient alert and oriented  , tolerated juice by mouth. CBG rechecked  100.

## 2014-05-20 ENCOUNTER — Encounter (HOSPITAL_COMMUNITY)
Admission: RE | Disposition: A | Discharge status: Skilled Nursing Facility | Payer: Medicare Other | Source: Ambulatory Visit | Attending: Gynecology

## 2014-05-20 ENCOUNTER — Encounter (HOSPITAL_COMMUNITY): Payer: Self-pay | Admitting: *Deleted

## 2014-05-20 DIAGNOSIS — N179 Acute kidney failure, unspecified: Secondary | ICD-10-CM

## 2014-05-20 DIAGNOSIS — I4891 Unspecified atrial fibrillation: Secondary | ICD-10-CM

## 2014-05-20 DIAGNOSIS — E1129 Type 2 diabetes mellitus with other diabetic kidney complication: Secondary | ICD-10-CM

## 2014-05-20 DIAGNOSIS — D649 Anemia, unspecified: Secondary | ICD-10-CM

## 2014-05-20 DIAGNOSIS — E1165 Type 2 diabetes mellitus with hyperglycemia: Secondary | ICD-10-CM

## 2014-05-20 HISTORY — PX: ESOPHAGOGASTRODUODENOSCOPY: SHX5428

## 2014-05-20 LAB — GLUCOSE, CAPILLARY
GLUCOSE-CAPILLARY: 150 mg/dL — AB (ref 70–99)
Glucose-Capillary: 123 mg/dL — ABNORMAL HIGH (ref 70–99)
Glucose-Capillary: 147 mg/dL — ABNORMAL HIGH (ref 70–99)

## 2014-05-20 LAB — BASIC METABOLIC PANEL
BUN: 24 mg/dL — ABNORMAL HIGH (ref 6–23)
CO2: 28 mEq/L (ref 19–32)
Calcium: 7.9 mg/dL — ABNORMAL LOW (ref 8.4–10.5)
Chloride: 101 mEq/L (ref 96–112)
Creatinine, Ser: 1.5 mg/dL — ABNORMAL HIGH (ref 0.50–1.10)
GFR calc Af Amer: 35 mL/min — ABNORMAL LOW (ref >90)
GFR, EST NON AFRICAN AMERICAN: 31 mL/min — AB (ref >90)
GLUCOSE: 126 mg/dL — AB (ref 70–99)
POTASSIUM: 3.4 meq/L — AB (ref 3.7–5.3)
SODIUM: 140 meq/L (ref 137–147)

## 2014-05-20 SURGERY — EGD (ESOPHAGOGASTRODUODENOSCOPY)
Anesthesia: Moderate Sedation

## 2014-05-20 MED ORDER — DIPHENHYDRAMINE HCL 50 MG/ML IJ SOLN
INTRAMUSCULAR | Status: AC
  Filled 2014-05-20: qty 1

## 2014-05-20 MED ORDER — MIDAZOLAM HCL 10 MG/2ML IJ SOLN
INTRAMUSCULAR | Status: AC
  Filled 2014-05-20: qty 2

## 2014-05-20 MED ORDER — MIDAZOLAM HCL 10 MG/2ML IJ SOLN
INTRAMUSCULAR | Status: DC | PRN
  Administered 2014-05-20 (×2): 1 mg via INTRAVENOUS

## 2014-05-20 MED ORDER — BUTAMBEN-TETRACAINE-BENZOCAINE 2-2-14 % EX AERO
INHALATION_SPRAY | CUTANEOUS | Status: DC | PRN
  Administered 2014-05-20: 2 via TOPICAL

## 2014-05-20 MED ORDER — FENTANYL CITRATE 0.05 MG/ML IJ SOLN
INTRAMUSCULAR | Status: AC
  Filled 2014-05-20: qty 2

## 2014-05-20 MED ORDER — SODIUM CHLORIDE 0.9 % IV SOLN
INTRAVENOUS | Status: DC
  Administered 2014-05-24: 06:00:00 via INTRAVENOUS

## 2014-05-20 MED ORDER — FENTANYL CITRATE 0.05 MG/ML IJ SOLN
INTRAMUSCULAR | Status: DC | PRN
  Administered 2014-05-20: 25 ug via INTRAVENOUS

## 2014-05-20 NOTE — Progress Notes (Signed)
Attempted to place foley catheter for urinary retention, but unable to do so due to anatomy and pain to pt. Pt going for Endo procedure, RN will ask if foley can be place by Endo RN while pt is sedated.

## 2014-05-20 NOTE — Progress Notes (Signed)
Patient was only able to take 2-3 small bites of her dinner tonight.  She was able to drink 121ml's of her Glucerna shake.

## 2014-05-20 NOTE — Progress Notes (Addendum)
Patient ID: [name], female   DOB: July 11, 1926, 80 y.o.   MRN: [id number] 8 Days Post-Op Procedure(s) (LRB): RADICAL VULVECTOMY WITH COLOSTOMY (N/A) EXPLORATORY LAPAROTOMY, TOTAL ABDO MINAL HYSTERECTOMY AND BILATERAL SALPINGOOPHERECTOMY (N/A)  Subjective: Patient w/o complaints  Objective: Vital signs in last 24 hours: Temp:  [97.9 F (36.6 C)-98 F (36.7 C)] 97.9 F (36.6 C) (05/27 0502) Pulse Rate:  [80-110] 110 (05/27 0502) Resp:  [16-18] 18 (05/27 0502) BP: (114-138)/(55-64) 121/59 mmHg (05/27 0502) SpO2:  [99 %-100 %] 100 % (05/27 0502) Weight:  [68.2 kg (150 lb 5.7 oz)] 68.2 kg (150 lb 5.7 oz) (05/27 0502) Last BM Date: 05/19/14  Intake/Output from previous day: 05/26 0701 - 05/27 0700 In: 880 [P.O.:300; I.V.:480; IV Piggyback:100] Out: 1050 [Urine:650; Drains:350; Stool:50]  CBG (last 3)   Recent Labs  05/19/14 1700 05/19/14 2108 05/20/14 0744  GLUCAP 176* 166* 123*   No results found.  Physical Examination: General: alert Resp: good air movement Cardio: irregular rhythm GI: soft, non-tender; bowel sounds normal;  incision: clean, dry and intact Extremities: extremities normal, atraumatic, no cyanosis or edema Vaginal Bleeding: minimal; the packing was removed/replaced Stoma obscured by tar-like stool in the pouch  Labs:   BUN/Cr/glu/ALT/AST/amyl/lip:  24/1.50/--/--/--/--/-- (05/27 0605)  CBG (last 3)   Recent Labs  05/19/14 1700 05/19/14 2108 05/20/14 0744  GLUCAP 176* 166* 123*     Assessment:  80 y.o. s/p Procedure(s): RADICAL VULVECTOMY WITH COLOSTOMY EXPLORATORY LAPAROTOMY, TOTAL ABDO MINAL HYSTERECTOMY AND BILATERAL SALPINGOOPHERECTOMY: stable Pain:  Pain is well-controlled on oral pain medicaions  Heme: Anemia: hemoglobin stable  CV: Arrhythmia: chronic atrial fibrilation Hypertension: B/Ps in the low normal range. Current treatment:  metoprolol (Lopressor, Toprol).  GI:  Acute GI bleeding--resolved  FEN: Electrolytes  stable  Renal: Slightly increased creatinine--?decreased intravascular volume  Pulm:  H/o chronic SOB; pulmonary status stable  Endo: Diabetes mellitus Type II, under adequate control.[name]     Prophylaxis: SCDs  GU: Urinary retention in the setting of recent radical pelvic surgical procedure   ID:  Afebrile on antibiotics Plan: Encourage ambulation Regular diet for now--NPO prior to endoscopy today Continue to adjust insulin regimen Continue PPI Continue Zosyn  Continue dressing changes BID Replace foley catheter Increase IVF for now     LOS: 8 days    [name] 05/20/2014, 9:28 AM

## 2014-05-20 NOTE — Progress Notes (Addendum)
[facility] Medical Consultation - Progress Note  [name] TKW:[id number] DOB: 1926-06-10 DOA: 05/12/2014 PCP: Elayne Snare, MD   Requesting physician: Dr. Fermin [name] Date of consultation: 05/16/14 Reason for consultation: multiple medical problems  Impression/Recommendations Active Problems:   Vulvar cancer, carcinoma   Hyperlipidemia   Hypothyroidism   Leukocytosis   CVA (cerebral infarction)   Type II or unspecified type diabetes mellitus with renal manifestations, uncontrolled(250.42)   Anemia   Obesity   Atrial fibrillation   Chronic kidney disease, stage III (moderate)   Vulva cancer   Chronic anticoagulation   GI bleed   1. GI bleed - Dr. Watt Climes following. Hemoglobin stable. Status post EGD 5/27-moderate hiatal hernia with distal esophageal ulcers and probable Barrett's with GE junction probable ulcerative mass-biopsied. Continue twice a day PPI. Followup pathology. Will need reevaluation of blood thinner needs versus risk of bleeding. 2. IDDM - continue current regimen. Reasonable inpatient control. 3. Chronic anticoagulation - on hold for now given #1. She has had CVAs and is currently in A fib. Will need reevaluation of anticoagulation needs versus risk of bleeding. 4. A fib - recently seen by Dr. Marlou Porch pre-op, with moderate risk. She is on Diltiazem 180 mg daily and on Metoprolol 50 mg BID. In the setting of hypotension held Diltiazem initially. Continued Metoprolol since it doesn't affect the BP as much and diltiazem had to be restarted on 5/26 due to intermittent tachycardia. 2D echo done 5/23 with normal EF, mild AS. Controlled ventricular rate. Anticoagulation discussion as above. 5. Aortic stenosis - last 2D echo 55-60% with moderate AS (03/2012). No significant change now.  6. Prior CVA with residual left sided weakness 7. S/p radical vulvectomy with colostomy, total abdominal hysterectomy and bilateral salpingoopherectomy for recurrent vulvar cancer -  per primary team. Empiric Zosyn.  8. Acute kidney injury:? Secondary to intravascular volume depletion versus urinary retention. Nurses attempting Foley placement. Encourage by mouth fluids. 9. Hypokalemia: Replace and follow BMP. Check magnesium and replace as needed-was 1.3 on 5/25 10. Anemia: Stable. 11. Thrombocytopenia: Stable 12. Hypothyroid: Continue Synthroid  I will followup again tomorrow. Please contact me if I can be of assistance in the meanwhile. Thank you for this consultation.  Subjective: Patient was seen prior to EGD this morning and was anxious regarding Foley catheter placement and EGD.  Physical Exam: Blood pressure 122/60, pulse 71, temperature 97.3 F (36.3 C), temperature source Oral, resp. rate 16, height 5\' 4"  (1.626 m), weight 68.2 kg (150 lb 5.7 oz), SpO2 100.00%. Filed Vitals:   05/20/14 1404  BP: 122/60  Pulse: 71  Temp: 97.3 F (36.3 C)  Resp: 16    General:  Elderly female lying in bed appear to be in mild discomfort and slightly anxious.  Eyes: no scleral icterus, closed left eye, chronic  Neck: no JVD  Cardiovascular: irregular, 3/6 SEM. Telemetry: Atrial fibrillation with controlled ventricular rate.  Respiratory: clear, moves air well, no wheezing. No increased work of breathing.  Abdomen: mildly tender. Laparotomy site staples intact. Colostomy with small amount of dark stools.  Skin: no rashes  Musculoskeletal: no edema  Neurologic: moves all 4, mild left sided residual weakness; follows commands well  Labs on Admission:  Basic Metabolic Panel:  Recent Labs Lab 05/17/14 0605 05/17/14 2200 05/18/14 0535 05/19/14 0535 05/20/14 0605  NA 139 139 143 141 140  K 3.0* 3.0* 3.2* 3.5* 3.4*  CL 104 100 104 102 101  CO2 26 26 29 28 28   GLUCOSE 70 124* 58*  160* 126*  BUN 34* 31* 29* 26* 24*  CREATININE 1.19* 1.26* 1.26* 1.45* 1.50*  CALCIUM 7.5* 8.0* 8.0* 8.0* 7.9*  MG  --   --  1.3*  --   --   PHOS  --   --  2.7  --   --     Liver Function Tests:  Recent Labs Lab 05/16/14 0445  AST 15  ALT 10  ALKPHOS 61  BILITOT 0.8  PROT 4.9*  ALBUMIN 1.9*   CBC:  Recent Labs Lab 05/17/14 2150 05/18/14 0535 05/18/14 1500 05/18/14 2155 05/19/14 0535  WBC 9.7 10.0 10.9* 10.2 9.3  HGB 9.8* 9.6* 9.2* 9.6* 9.6*  HCT 28.9* 27.6* 27.9* 28.9* 28.9*  MCV 89.8 89.9 90.6 91.7 92.6  PLT 143* 139* 143* 141* 137*   CBG:  Recent Labs Lab 05/19/14 0732 05/19/14 1147 05/19/14 1700 05/19/14 2108 05/20/14 0744  GLUCAP 139* 159* 176* 166* 123*    Radiological Exams on Admission: No results found.  [name] acetaminophen  1,000 mg Oral Q6H  . antiseptic oral rinse  15 mL Mouth Rinse q12n4p  . atorvastatin  20 mg Oral Daily  . chlorhexidine  15 mL Mouth Rinse BID  . diltiazem  120 mg Oral Daily  . feeding supplement (GLUCERNA SHAKE)  237 mL Oral BID BM  . furosemide  20 mg Oral Daily  . insulin aspart  0-9 Units Subcutaneous TID WC  . insulin glargine  5 Units Subcutaneous QHS  . levothyroxine  100 mcg Oral Once per day on Mon Tue Wed Thu Fri Sat  . metoprolol  50 mg Oral BID  . pantoprazole (PROTONIX) IV  40 mg Intravenous Q12H  . piperacillin-tazobactam  3.375 g Intravenous 3 times per day  . potassium chloride  10 mEq Oral BID  . traMADol  50 mg Oral 4 times per day     Time spent: [place], MD, FACP, [facility]. [facility] Pager [phone]  If 7PM-7AM, please contact night-coverage [email or web address] Password [email or web address] 05/20/2014, 5:55 PM  05/20/2014, 5:47 PM

## 2014-05-20 NOTE — Op Note (Signed)
[facility] [place], [id number]   ENDOSCOPY PROCEDURE REPORT  PATIENT: [name], [name]  MR#: [id number] BIRTHDATE: Oct 01, 1926 , 76  yrs. old GENDER: Female  ENDOSCOPIST: Clarene Essex, MD REFERRED BY:  PROCEDURE DATE:  05/20/2014 PROCEDURE:   EGD w/ biopsy ASA CLASS:   Class III INDICATIONS:Hematemesis.  MEDICATIONS: Fentanyl 25 mcg IV and Versed 2 mg IV  TOPICAL ANESTHETIC:used  DESCRIPTION OF PROCEDURE:   After the risks benefits and alternatives of the procedure were thoroughly explained, informed consent was obtained.  The Alvan [id number]  endoscope was introduced through the mouth and advanced to the third portion of the duodenum , limited by Without limitations.   The instrument was slowly withdrawn as the mucosa was fully examined.the findings are recorded below and the patient tolerated the procedure well there was no obvious immediate complication         FINDINGS:1. Moderate sized hiatal hernia with distal esophageal ulcers and probable Barrett's with GE junction probable ulcerative mass status post brushing and biopsy 2. Otherwise within normal limits EGD to the third part of the duodenum  COMPLICATIONS:none  ENDOSCOPIC IMPRESSION:above   RECOMMENDATIONS:twice a day pump inhibitors await pathology and will need reevaluation of blood thinner needs versus risks of bleeding and await pathology to determine further workup and plans   REPEAT EXAM: as needed   _______________________________ Clarene Essex, MD eSigned:  Clarene Essex, MD 05/20/2014 12:57 PM    CC:  PATIENT NAME:  [name], [name] MR#: [id number]

## 2014-05-20 NOTE — Progress Notes (Signed)
[name] 12:29 PM  Subjective: Patient without any new complaints but is having some discomfort all over and a little increased anxiety about the procedure but no signs of bleeding  Objective: Vital signs stable afebrile no acute distress exam please see pre-assessment evaluation no new hemoglobin today  Assessment: Multiple medical problems in a patient with self-limited hematemesis  Plan: Okay to proceed with endoscopy and moderate sedation today  Jeryl Columbia

## 2014-05-21 ENCOUNTER — Encounter (HOSPITAL_COMMUNITY): Payer: Self-pay | Admitting: Gastroenterology

## 2014-05-21 ENCOUNTER — Telehealth: Payer: Self-pay | Admitting: *Deleted

## 2014-05-21 DIAGNOSIS — N183 Chronic kidney disease, stage 3 unspecified: Secondary | ICD-10-CM

## 2014-05-21 DIAGNOSIS — Z7901 Long term (current) use of anticoagulants: Secondary | ICD-10-CM

## 2014-05-21 DIAGNOSIS — I6350 Cerebral infarction due to unspecified occlusion or stenosis of unspecified cerebral artery: Secondary | ICD-10-CM

## 2014-05-21 DIAGNOSIS — K922 Gastrointestinal hemorrhage, unspecified: Secondary | ICD-10-CM

## 2014-05-21 LAB — URINALYSIS, ROUTINE W REFLEX MICROSCOPIC
Bilirubin Urine: NEGATIVE
GLUCOSE, UA: NEGATIVE mg/dL
HGB URINE DIPSTICK: NEGATIVE
Ketones, ur: NEGATIVE mg/dL
Nitrite: NEGATIVE
PH: 6.5 (ref 5.0–8.0)
Protein, ur: NEGATIVE mg/dL
Specific Gravity, Urine: 1.012 (ref 1.005–1.030)
Urobilinogen, UA: 1 mg/dL (ref 0.0–1.0)

## 2014-05-21 LAB — BASIC METABOLIC PANEL
BUN: 18 mg/dL (ref 6–23)
CHLORIDE: 102 meq/L (ref 96–112)
CO2: 27 mEq/L (ref 19–32)
Calcium: 8 mg/dL — ABNORMAL LOW (ref 8.4–10.5)
Creatinine, Ser: 1.33 mg/dL — ABNORMAL HIGH (ref 0.50–1.10)
GFR calc non Af Amer: 35 mL/min — ABNORMAL LOW (ref >90)
GFR, EST AFRICAN AMERICAN: 41 mL/min — AB (ref >90)
Glucose, Bld: 207 mg/dL — ABNORMAL HIGH (ref 70–99)
POTASSIUM: 4.2 meq/L (ref 3.7–5.3)
Sodium: 140 mEq/L (ref 137–147)

## 2014-05-21 LAB — URINE MICROSCOPIC-ADD ON

## 2014-05-21 LAB — GLUCOSE, CAPILLARY
GLUCOSE-CAPILLARY: 238 mg/dL — AB (ref 70–99)
GLUCOSE-CAPILLARY: 173 mg/dL — AB (ref 70–99)
Glucose-Capillary: 186 mg/dL — ABNORMAL HIGH (ref 70–99)

## 2014-05-21 LAB — MAGNESIUM: Magnesium: 1.5 mg/dL (ref 1.5–2.5)

## 2014-05-21 MED ORDER — OXYCODONE HCL 5 MG PO TABS
5.0000 mg | ORAL_TABLET | Freq: Four times a day (QID) | ORAL | Status: DC | PRN
Start: 2014-05-21 — End: 2014-05-25
  Administered 2014-05-21 – 2014-05-25 (×4): 5 mg via ORAL
  Filled 2014-05-21 (×6): qty 1

## 2014-05-21 MED ORDER — GLUCERNA SHAKE PO LIQD
237.0000 mL | Freq: Three times a day (TID) | ORAL | Status: DC
  Administered 2014-05-22 – 2014-05-25 (×9): 237 mL via ORAL
  Filled 2014-05-21 (×13): qty 237

## 2014-05-21 MED ORDER — MAGNESIUM OXIDE 400 (241.3 MG) MG PO TABS
200.0000 mg | ORAL_TABLET | Freq: Every day | ORAL | Status: DC
  Administered 2014-05-21 – 2014-05-25 (×5): 200 mg via ORAL
  Filled 2014-05-21 (×6): qty 0.5

## 2014-05-21 MED ORDER — PANTOPRAZOLE SODIUM 40 MG PO TBEC
40.0000 mg | DELAYED_RELEASE_TABLET | Freq: Two times a day (BID) | ORAL | Status: DC
  Administered 2014-05-21 – 2014-05-25 (×8): 40 mg via ORAL
  Filled 2014-05-21 (×11): qty 1

## 2014-05-21 NOTE — Progress Notes (Signed)
Had SLP bedside swallow evaluation 5/26, pt noted to be at mild aspiration risk with recommendations for regular/thin liquid  Had EGD 5/27 which showed moderate sized hiatal hernia with distal esophageal ulcers.  Hopefully as pt's pain level improves, appetite/intake will improve.   Agree with dietetic intern's note and recommendations.   Mikey College MS, [place], [place] Pager [phone] After Hours Pager

## 2014-05-21 NOTE — Progress Notes (Signed)
Speech Language Pathology Treatment: Dysphagia  Patient Details Name: [name] MRN: [id number] DOB: 27-Mar-1926 Today's Date: 05/21/2014 Time: 0352-4818 SLP Time Calculation (min): 31 min  Assessment / Plan / Recommendation Clinical Impression  Pt today observed with intake of Glucerna and peaches with good tolerance- timely swallow and no indication of airway compromise.  She admits to h/o dysphagia after CVA requiring PEG that completely resolved prior to recent events.  Voice remains nearly aphonic which pt states has occurred x3 months progressively - ? Source?[name]    SLP educated pt to aspiration precautions to maximize her airway protection and increase comfort with swallowing.  Will sign off as pt appears to be tolerating diet currently although intake is poor due to poor appetite per pt.  Please reorder if warranted.    HPI HPI: RADICAL VULVECTOMY WITH COLOSTOMY,  EXPLORATORY LAPAROTOMY, TOTAL ABDO MINAL HYSTERECTOMY AND BILATERAL SALPINGOOPHERECTOMY recurrent vulvular carcinoma, PMH of CVA's with dysphagia, requiring PEG placement 05/01/12.   SLP swallow evaluation ordered secondary to patient complaint of difficulty swallowing, especially with pills.  Pt underwent BSE with SLP and follow up indicated to determine tolerance of po diet.  Pt is s/p endoscopy with findings of ulcerative mass at [place] , hiatal hernia, barrett's esophagus.     Pertinent Vitals Afebrile, decreased  SLP Plan  All goals met    Recommendations Diet recommendations: Dysphagia 3 (mechanical soft);Thin liquid Liquids provided via: Straw;Cup Medication Administration: Crushed with puree (if small whole with liquids) Supervision: Staff to assist with self feeding (at least set up assistance) Compensations: Slow rate;Small sips/bites Postural Changes and/or Swallow Maneuvers: Seated upright 90 degrees;Upright 30-60 min after meal              Oral Care Recommendations: Oral care BID;Staff/trained caregiver to  provide oral care Follow up Recommendations: None Plan: All goals met    [place], [place] [facility] SLP [phone]

## 2014-05-21 NOTE — Progress Notes (Signed)
INITIAL NUTRITION ASSESSMENT  DOCUMENTATION CODES Per approved criteria  -Not Applicable   INTERVENTION: Increase Glucerna Shakes to TID, each supplement providing 220 kcal and 10 grams of protein RD to continue to follow nutrition care plan  NUTRITION DIAGNOSIS: Inadequate oral intake related to SOB, tiring easily as evidenced by 50% meal completion per Epic chart review.   Goal: Pt to meet >/=90% of estimated nutrition needs   Monitor:  PO intake, supplement acceptance, labs, weight trend   Reason for Assessment: Positive Malnutrition Screening Tool Score   80 y.o. female  Admitting Dx: Vulva Cancer  ASSESSMENT: Patient is a 80 y.o female that has presented for surgery, with the diagnosis of VULVAR CANCER. Pt with PMHx significant for DM, HTN, HLD, Obesity, Vulvar cancer, stroke.   Pt is s/p the following on 5/19:  RADICAL VULVECTOMY WITH COLOSTOMY (N/A)  EXPLORATORY LAPAROTOMY, TOTAL ABDO MINAL HYSTERECTOMY AND BILATERAL SALPINGOOPHERECTOMY (N/A)  -Pt unable to give dietary history as pt was in much pain during assessment and unable to talk much -Pt unsure of weight/weight loss -Pt explained it is so hard to eat because she gets tired/out of breath -Pt has Glucerna in room and pt reports liking them -Encouraged pt to drink Glucerna if she isn't able to eat meals   No muscle or subcutaneous fat depletion noticed.   Height: Ht Readings from Last 1 Encounters:  05/12/14 5\' 4"  (1.626 m)    Weight: Wt Readings from Last 1 Encounters:  05/21/14 151 lb 7.3 oz (68.7 kg)    Ideal Body Weight: 54.4 kg   % Ideal Body Weight: 126%   Wt Readings from Last 10 Encounters:  05/21/14 151 lb 7.3 oz (68.7 kg)  05/21/14 151 lb 7.3 oz (68.7 kg)  05/21/14 151 lb 7.3 oz (68.7 kg)  05/04/14 141 lb 8 oz (64.184 kg)  04/29/14 140 lb 6.4 oz (63.685 kg)  04/28/14 139 lb 9.6 oz (63.322 kg)  04/15/14 143 lb 12.8 oz (65.227 kg)  04/06/14 147 lb 6.4 oz (66.86 kg)  03/09/14 148 lb  9.6 oz (67.405 kg)  03/04/14 148 lb 9.6 oz (67.405 kg)    Usual Body Weight: 150 lbs   % Usual Body Weight: 100%  BMI:  Body mass index is 25.98 kg/(m^2)., Overweight   Estimated Nutritional Needs: Kcal: 1300 - 1500 Protein: 60 - 70 grams  Fluid: >/= 1.5 L/day   Skin: WNL   Diet Order: Criss Rosales  EDUCATION NEEDS: -No education needs identified at this time   Intake/Output Summary (Last 24 hours) at 05/21/14 1053 Last data filed at 05/21/14 0839  Gross per 24 hour  Intake 2234.5 ml  Output   1785 ml  Net  449.5 ml    Last BM: 5/27   Labs:   Recent Labs Lab 05/18/14 0535 05/19/14 0535 05/20/14 0605  NA 143 141 140  K 3.2* 3.5* 3.4*  CL 104 102 101  CO2 29 28 28   BUN 29* 26* 24*  CREATININE 1.26* 1.45* 1.50*  CALCIUM 8.0* 8.0* 7.9*  MG 1.3*  --   --   PHOS 2.7  --   --   GLUCOSE 58* 160* 126*    CBG (last 3)   Recent Labs  05/20/14 1633 05/20/14 2109 05/21/14 0726  GLUCAP 150* 147* 186*    Scheduled Meds: . acetaminophen  1,000 mg Oral Q6H  . antiseptic oral rinse  15 mL Mouth Rinse q12n4p  . atorvastatin  20 mg Oral Daily  . chlorhexidine  15  mL Mouth Rinse BID  . diltiazem  120 mg Oral Daily  . feeding supplement (GLUCERNA SHAKE)  237 mL Oral BID BM  . furosemide  20 mg Oral Daily  . insulin aspart  0-9 Units Subcutaneous TID WC  . insulin glargine  5 Units Subcutaneous QHS  . levothyroxine  100 mcg Oral Once per day on Mon Tue Wed Thu Fri Sat  . magnesium oxide  200 mg Oral Daily  . metoprolol  50 mg Oral BID  . pantoprazole  40 mg Oral BID AC  . piperacillin-tazobactam  3.375 g Intravenous 3 times per day  . potassium chloride  10 mEq Oral BID  . traMADol  50 mg Oral 4 times per day    Continuous Infusions: . sodium chloride    . dextrose 5 % and 0.45 % NaCl with KCl 20 mEq/L 75 mL/hr at 05/21/14 2671    Past Medical History  Diagnosis Date  . Hypertension   . Hyperlipidemia   . Obesity   . Hypothyroidism   . Vulvar cancer,  carcinoma 11/03/2011  . Atrial fibrillation   . Cancer 2007    vulva-invasive well diff squamous cell ca  . CVA (cerebral infarction)   . Dyslipidemia   . History of radiation therapy 10/20/2013-12/01/2013    60 gray to perineum area and lower pelvis  . Diabetes mellitus 05-04-14    "Diabetic coma" 1 week ago -tx. by [facility] until pt. feeling improved and stable.  . Shortness of breath     occ. with coughing up phelgm usually mornings  . 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 Abnormal voice 05-04-14    "raspy voice" with inspiratory effort-Dr. Lissa Hoard aware 05-04-14  . Stroke 05/2000    right brain CVA pre H&P 2001-    Past Surgical History  Procedure Laterality Date  . Vulvar lesion removal  2012  . Breast surgery      Lumpectomy  . Dilation and curettage of uterus    . Cystourethroplasty / ureteroneocystostomy    . Radical vulvectomy    . Lymphadenectomy  2007    R inguinal femoral  . Radical vulvectomy  02/27/12  . Vulvar lesion removal  02/27/2012    Procedure: VULVAR LESION;  Surgeon: Alvino Chapel, MD;  Location: [facility];  Service: Gynecology;  Laterality: N/A;  . Right carotid endarterctomy    . Left eye surgery      , permanently stitched eye together  . Vulvectomy N/A 05/12/2014    Procedure: RADICAL VULVECTOMY WITH COLOSTOMY;  Surgeon: Alvino Chapel, MD;  Location: [facility];  Service: Gynecology;  Laterality: N/A;  . Laparotomy N/A 05/12/2014    Procedure: EXPLORATORY LAPAROTOMY, TOTAL ABDO MINAL HYSTERECTOMY AND BILATERAL SALPINGOOPHERECTOMY;  Surgeon: Alvino Chapel, MD;  Location: [facility];  Service: Gynecology;  Laterality: N/A;  . Esophagogastroduodenoscopy N/A 05/20/2014    Procedure: ESOPHAGOGASTRODUODENOSCOPY (EGD);  Surgeon: Jeryl Columbia, MD;  Location: [facility];  Service: Endoscopy;  Laterality: N/A;    Carrolyn Leigh, BS Dietetic Intern Pager: [phone]

## 2014-05-21 NOTE — Progress Notes (Signed)
PT Cancellation Note  ___Treatment cancelled today due to medical issues with patient which prohibited therapy  ___ Treatment cancelled today due to patient receiving procedure or test   ___ Treatment cancelled today due to patient's refusal to participate   _X_ Treatment cancelled today due to issues of pain.  Pt was OOB earlier with nursing and in recliner however now back in bed lying on her side due to MAX c/o pain in her buttocks.  Will check on her tomorrow.  Rica Koyanagi  PTA [facility]  Acute  Rehab Pager      [phone]

## 2014-05-21 NOTE — Progress Notes (Signed)
CSW continues to follow for discharge planning - patient is set to discharge to [facility] on Monday, 05/25/14. CSW confirmed with [facility] (ph#: [phone]) plans for discharge.   CSW will follow-up Monday.   Raynaldo Opitz, [facility] Clinical Social Worker cell #: [phone]

## 2014-05-21 NOTE — Progress Notes (Signed)
[facility] Medical Consultation - Progress Note  [name] YWV:[id number] DOB: 03-11-1926 DOA: 05/12/2014 PCP: Elayne Snare, MD   Requesting physician: Dr. 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 GI bleed - Dr. Watt Climes following. Hemoglobin stable. Status post EGD 5/27-moderate hiatal hernia with distal esophageal ulcers and probable Barrett's with GE junction probable ulcerative mass-biopsied. Continue twice a day PPI. Followup pathology. Will need reevaluation of blood thinner needs versus risk of bleeding. 2. IDDM - continue current regimen. Reasonable inpatient control. 3. Chronic anticoagulation - on hold for now given #1. She has had CVAs and is currently in A fib. Will need reevaluation of anticoagulation needs versus risk of bleeding. 4. A fib - recently seen by Dr. Marlou Porch pre-op, with moderate risk. She is on Diltiazem 180 mg daily and on Metoprolol 50 mg BID. In the setting of hypotension held Diltiazem initially. Continued Metoprolol since it doesn't affect the BP as much and diltiazem had to be restarted on 5/26 due to intermittent tachycardia. 2D echo done 5/23 with normal EF, mild AS. Controlled ventricular rate. Anticoagulation discussion as above-cardiology states that she is at high risk for cardioembolic events and has prior history of stroke and recommend resuming anticoagulation when possible-? After pathology results-will discuss with GI in a.m.. 5. Aortic stenosis - last 2D echo 55-60% with moderate AS (03/2012). No  significant change now.  6. Prior CVA with residual left sided weakness 7. S/p radical vulvectomy with colostomy, total abdominal hysterectomy and bilateral salpingoopherectomy for recurrent vulvar cancer - per primary team. Empiric Zosyn.  8. Acute kidney injury:? Secondary to intravascular volume depletion versus urinary retention. Foley placement. Encourage by mouth fluids. 9. Hypokalemia: Replace and follow BMP. Check magnesium and replace as needed-was 1.3 on 5/25 10. Anemia: Stable. 11. Thrombocytopenia: Stable 12. Hypothyroid: Continue Synthroid  I will followup again tomorrow. Please contact me if I can be of assistance in the meanwhile. Thank you for this consultation.  Subjective: Patient complained of abdominal soreness. Denies leg cramps or pain at time of my visit.  Physical Exam: Blood pressure 109/69, pulse 72, temperature 97.6 F (36.4 C), temperature source Oral, resp. rate 18, height 5\' 4"  (1.626 m), weight 68.7 kg (151 lb 7.3 oz), SpO2 94.00%. Filed Vitals:   05/21/14 1449  BP: 109/69  Pulse: 72  Temp: 97.6 F (36.4 C)  Resp:     General:  Elderly female lying in bed and was in no obvious distress.  Eyes: no scleral icterus, closed left eye, chronic  Neck: no JVD  Cardiovascular: irregular, 3/6 SEM. Telemetry: Atrial fibrillation with controlled ventricular rate.  Respiratory: clear, moves air well, no wheezing. No increased work of breathing.  Abdomen: mildly tender. Laparotomy site staples intact. Colostomy +.  Skin: no rashes  Musculoskeletal: no edema  Neurologic: moves all 4, mild left sided residual weakness; follows commands well  Labs on Admission:  Basic Metabolic Panel:  Recent Labs Lab 05/17/14 0605 05/17/14 2200 05/18/14 0535 05/19/14 0535 05/20/14 0605  NA 139 139 143 141 140  K 3.0* 3.0* 3.2* 3.5* 3.4*  CL 104 100 104 102 101  CO2 26 26 29 28 28   GLUCOSE 70 124* 58* 160* 126*  BUN 34* 31* 29* 26* 24*  CREATININE 1.19* 1.26*  1.26* 1.45* 1.50*  CALCIUM 7.5* 8.0* 8.0* 8.0* 7.9*  MG  --   --  1.3*  --   --   PHOS  --   --  2.7  --   --    Liver Function Tests:  Recent Labs Lab 05/16/14 0445  AST 15  ALT 10  ALKPHOS 61  BILITOT 0.8  PROT 4.9*  ALBUMIN 1.9*   CBC:  Recent Labs Lab 05/17/14 2150 05/18/14 0535 05/18/14 1500 05/18/14 2155 05/19/14 0535  WBC 9.7 10.0 10.9* 10.2 9.3  HGB 9.8* 9.6* 9.2* 9.6* 9.6*  HCT 28.9* 27.6* 27.9* 28.9* 28.9*  MCV 89.8 89.9 90.6 91.7 92.6  PLT 143* 139* 143* 141* 137*   CBG:  Recent Labs Lab 05/20/14 1633 05/20/14 2109 05/21/14 0726 05/21/14 1128 05/21/14 1725  GLUCAP 150* 147* 186* 238* 173*    Radiological Exams on Admission: No results found.  [name] acetaminophen  1,000 mg Oral Q6H  . antiseptic oral rinse  15 mL Mouth Rinse q12n4p  . atorvastatin  20 mg Oral Daily  . chlorhexidine  15 mL Mouth Rinse BID  . diltiazem  120 mg Oral Daily  . feeding supplement (GLUCERNA SHAKE)  237 mL Oral TID WC  . furosemide  20 mg Oral Daily  . insulin aspart  0-9 Units Subcutaneous TID WC  . insulin glargine  5 Units Subcutaneous QHS  . levothyroxine  100 mcg Oral Once per day on Mon Tue Wed Thu Fri Sat  . magnesium oxide  200 mg Oral Daily  . metoprolol  50 mg Oral BID  . pantoprazole  40 mg Oral BID AC  . piperacillin-tazobactam  3.375 g Intravenous 3 times per day  . potassium chloride  10 mEq Oral BID  . traMADol  50 mg Oral 4 times per day     Time spent: [place], MD, FACP, [facility]. [facility] Pager [phone]  If 7PM-7AM, please contact night-coverage [email or web address] Password [email or web address] 05/21/2014, 5:46 PM  05/21/2014, 5:46 PM

## 2014-05-21 NOTE — Progress Notes (Signed)
Patient has been complaining of leg and foot cramps all day.  I have encouraged range of motion and repositioning but patient has no relief.  Attending physicians are aware and have ordered morning labs.  Will continue to monitor.

## 2014-05-21 NOTE — Progress Notes (Signed)
Patient ID: [name], female   DOB: 1926/01/12, 80 y.o.   MRN: [id number] 1 Day Post-Op Procedure(s) (LRB): ESOPHAGOGASTRODUODENOSCOPY (EGD) (N/A)  Subjective: Patient w/o new complaints  Objective: Vital signs in last 24 hours: Temp:  [97.3 F (36.3 C)-97.9 F (36.6 C)] 97.9 F (36.6 C) (05/28 0552) Pulse Rate:  [40-90] 75 (05/28 0552) Resp:  [10-23] 18 (05/28 0552) BP: (111-165)/(43-139) 133/60 mmHg (05/28 0552) SpO2:  [89 %-100 %] 98 % (05/28 0552) Weight:  [68.7 kg (151 lb 7.3 oz)] 68.7 kg (151 lb 7.3 oz) (05/28 0552) Last BM Date: 05/20/14  Intake/Output from previous day: 05/27 0701 - 05/28 0700 In: 2114.5 [P.O.:120; I.V.:1844.5; IV Piggyback:150] Out: 7893 [Urine:1350; Drains:395]  CBG (last 3)   Recent Labs  05/20/14 1633 05/20/14 2109 05/21/14 0726  GLUCAP 150* 147* 186*    Physical Examination: General: alert Resp: good air movement Cardio: irregular rhythm GI: soft, non-tender; bowel sounds normal;  incision: clean, dry and intact Extremities: extremities normal, atraumatic, no cyanosis or edema   Labs:      CBG (last 3)   Recent Labs  05/20/14 1633 05/20/14 2109 05/21/14 0726  GLUCAP 150* 147* 186*     Assessment:  80 y.o. s/p Procedure(s): RADICAL VULVECTOMY WITH COLOSTOMY EXPLORATORY LAPAROTOMY, TOTAL ABDO MINAL HYSTERECTOMY AND BILATERAL SALPINGOOPHERECTOMY: stable Pain:  Pain is well-controlled on oral pain medicaions  Heme: Anemia: hemoglobin stable  CV: Arrhythmia: chronic atrial fibrilation Hypertension: B/Ps in the low normal range. Current treatment:  metoprolol (Lopressor, Toprol).  GI:  Acute GI bleeding--resolved Peptic ulcer disease  FEN: Mild hypomagnesemia   Renal: Slightly increased creatinine--likely decreased intravascular volume vs mild ATN  Pulm:  H/o chronic SOB; pulmonary status stable  Endo: Diabetes mellitus Type II, under adequate control.[name]     Prophylaxis: SCDs  GU: Urinary retention in the  setting of recent radical pelvic surgical procedure   ID:  Afebrile on antibiotics Plan:  Cardiology consult re: risk of anticoagulation vs risk of CVA Encourage ambulation Regular diet for now Continue PPI Continue Zosyn to complete 1 week course  Continue dressing changes BID Mg oxide Labs in am Increase IVF for now     LOS: 9 days    [name] 05/21/2014, 11:09 AM

## 2014-05-21 NOTE — Progress Notes (Addendum)
[name] 9:34 AM  Subjective: Patient without any GI complaints and no post endoscopic problem and we discussed those findings and her primary complaint is leg pain and foot pain and cramps  Objective: Vital signs stable afebrile abdomen is soft nontender  Assessment: Multiple medical problem  Plan: Await pathology please let us know if we can be of any further assistance in the meantime  and continue pump inhibitors long-term  Jeryl Columbia

## 2014-05-21 NOTE — Consult Note (Addendum)
CARDIOLOGY CONSULT NOTE   Patient ID: [name] MRN: [id number], DOB/AGE: 06/04/1926   Admit date: 05/12/2014 Date of Consult: 05/21/2014  Primary Physician: Elayne Snare, MD Primary Cardiologist: Candee Furbish  Reason for consult:  GIB, chronic a-fib on anticoagulation  Problem List  Past Medical History  Diagnosis Date  . Hypertension   . Hyperlipidemia   . Obesity   . Hypothyroidism   . Vulvar cancer, carcinoma 11/03/2011  . Atrial fibrillation   . Cancer 2007    vulva-invasive well diff squamous cell ca  . CVA (cerebral infarction)   . Dyslipidemia   . History of radiation therapy 10/20/2013-12/01/2013    60 gray to perineum area and lower pelvis  . Diabetes mellitus 05-04-14    "Diabetic coma" 1 week ago -tx. by EMT [facility] residence until pt. feeling improved and stable.  . Shortness of breath     occ. with coughing up phelgm usually mornings  . 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 Abnormal voice 05-04-14    "raspy voice" with inspiratory effort-Dr. Lissa Hoard aware 05-04-14  . Stroke 05/2000    right brain CVA pre H&P 2001-    Past Surgical History  Procedure Laterality Date  . Vulvar lesion removal  2012  . Breast surgery      Lumpectomy  . Dilation and curettage of uterus    . Cystourethroplasty / ureteroneocystostomy    . Radical vulvectomy    . Lymphadenectomy  2007    R inguinal femoral  . Radical vulvectomy  02/27/12  . Vulvar lesion removal  02/27/2012    Procedure: VULVAR LESION;  Surgeon: Alvino Chapel, MD;  Location: [facility];  Service: Gynecology;  Laterality: N/A;  . Right carotid endarterctomy    . Left eye surgery      , permanently stitched eye together  . Vulvectomy N/A 05/12/2014    Procedure: RADICAL VULVECTOMY WITH COLOSTOMY;  Surgeon: Alvino Chapel, MD;  Location: [facility];  Service: Gynecology;  Laterality: N/A;  . Laparotomy N/A 05/12/2014    Procedure: EXPLORATORY LAPAROTOMY, TOTAL ABDO MINAL HYSTERECTOMY AND  BILATERAL SALPINGOOPHERECTOMY;  Surgeon: Alvino Chapel, MD;  Location: [facility];  Service: Gynecology;  Laterality: N/A;  . Esophagogastroduodenoscopy N/A 05/20/2014    Procedure: ESOPHAGOGASTRODUODENOSCOPY (EGD);  Surgeon: Jeryl Columbia, MD;  Location: [facility];  Service: Endoscopy;  Laterality: N/A;    Allergies  Allergies  Allergen Reactions  . Tenex [Guanfacine Hcl] Other (See Comments)    hypotension  . Crestor [Rosuvastatin] Other (See Comments)    Myalgia   HPI   [name] is a 80 y.o. female with h/o HTN, HLP, chronic a-fib on chronic anticoagulation with coumadine. She underwent a radical vulvectomy with colostomy, total abdominal hysterectomy and B/L salpingo opherectomy on 05/12/2014. Post surgically she developed coffee ground emesis and anemia after lovenox and coumadine was restarted. EGD was performed yesterday with findings of moderate hiatal hernia with distal esophageal ulcers and probable Barrett's with GE junction probable ulcerative mass-biopsied.  The consult was placed for evaluation of risk of CVA vs GIB in the setting of anticoagulation.   Inpatient Medications  . acetaminophen  1,000 mg Oral Q6H  . antiseptic oral rinse  15 mL Mouth Rinse q12n4p  . atorvastatin  20 mg Oral Daily  . chlorhexidine  15 mL Mouth Rinse BID  . diltiazem  120 mg Oral Daily  . feeding supplement (GLUCERNA SHAKE)  237 mL Oral TID WC  .  furosemide  20 mg Oral Daily  . insulin aspart  0-9 Units Subcutaneous TID WC  . insulin glargine  5 Units Subcutaneous QHS  . levothyroxine  100 mcg Oral Once per day on Mon Tue Wed Thu Fri Sat  . magnesium oxide  200 mg Oral Daily  . metoprolol  50 mg Oral BID  . pantoprazole  40 mg Oral BID AC  . piperacillin-tazobactam  3.375 g Intravenous 3 times per day  . potassium chloride  10 mEq Oral BID  . traMADol  50 mg Oral 4 times per day    Family History Family History  Problem Relation Age of Onset  . Diabetes type II    .  Coronary artery disease Mother   . Lung cancer Brother   . Diabetes type II Brother      Social History History   Social History  . Marital Status: Married    Spouse Name: N/A    Number of Children: N/A  . Years of Education: N/A   Occupational History  . Not on file.   Social History Main Topics  . Smoking status: Former Smoker -- 0.25 packs/day for 18 years    Types: Cigarettes    Quit date: 11/03/1967  . Smokeless tobacco: Never Used  . Alcohol Use: No  . Drug Use: No  . Sexual Activity: Not Currently   Other Topics Concern  . Not on file   Social History Narrative  . No narrative on file     Review of Systems  General:  No chills, fever, night sweats or weight changes.  Cardiovascular:  No chest pain, dyspnea on exertion, edema, orthopnea, palpitations, paroxysmal nocturnal dyspnea. Dermatological: No rash, lesions/masses Respiratory: No cough, dyspnea Urologic: No hematuria, dysuria Abdominal:   No nausea, vomiting, diarrhea, bright red blood per rectum, melena, or hematemesis Neurologic:  No visual changes, wkns, changes in mental status. All other systems reviewed and are otherwise negative except as noted above.  Physical Exam  Blood pressure 133/60, pulse 75, temperature 97.9 F (36.6 C), temperature source Oral, resp. rate 18, height 5\' 4"  (1.626 m), weight 151 lb 7.3 oz (68.7 kg), SpO2 98.00%.  General: Pleasant, NAD, appears tired Psych: Normal affect. Neuro: Alert and oriented X 3. Moves all extremities spontaneously. HEENT: Normal  Neck: Supple without bruits or JVD. Lungs:  Resp regular and unlabored, CTA. Heart: RRR no s3, s4, 2/6 systolic murmur. Abdomen: Soft, non-tender, non-distended, BS + x 4.  Extremities: No clubbing, cyanosis or edema. DP/PT/Radials 2+ and equal bilaterally.  Labs  No results found for this basename: CKTOTAL, CKMB, TROPONINI,  in the last 72 hours Lab Results  Component Value Date   WBC 9.3 05/19/2014   HGB 9.6*  05/19/2014   HCT 28.9* 05/19/2014   MCV 92.6 05/19/2014   PLT 137* 05/19/2014    Recent Labs Lab 05/16/14 0445  05/20/14 0605  NA 139  < > 140  K 3.7  < > 3.4*  CL 101  < > 101  CO2 24  < > 28  BUN 41*  < > 24*  CREATININE 1.21*  < > 1.50*  CALCIUM 8.2*  < > 7.9*  PROT 4.9*  --   --   BILITOT 0.8  --   --   ALKPHOS 61  --   --   ALT 10  --   --   AST 15  --   --   GLUCOSE 195*  < > 126*  < > =  values in this interval not displayed. Lab Results  Component Value Date   CHOL 114 04/06/2014   HDL 28.70* 04/06/2014   LDLCALC 52 04/06/2014   TRIG 167.0* 04/06/2014   Radiology/Studies  Dg Chest 1 View  05/16/2014   CLINICAL DATA:  Shortness of breath, vomiting  EXAM: CHEST - 1 VIEW  COMPARISON:  05/04/2014  FINDINGS: Mild cardiomegaly. Mild interstitial edema/ infiltrates predominant perihilar, right worse than left. . No definite effusion.  Atheromatous aorta. Spurring in the thoracic spine.  IMPRESSION: 1. Cardiomegaly with asymmetric edema/infiltrates, right worse than left.   Electronically Signed   By: Arne Cleveland M.D.   On: 05/16/2014 11:12   ECHO 05/17/2014  - Left ventricle: The cavity size was normal. Wall thickness was normal. Systolic function was normal. The estimated ejection fraction was in the range of 55% to 60%. - Aortic valve: There was mild stenosis. - Mitral valve: Calcified annulus. There was mild regurgitation. - Left atrium: The atrium was moderately to severely dilated. - Right ventricle: The cavity size was mildly dilated. Wall thickness was normal. - Right atrium: The atrium was moderately dilated. - Atrial septum: No defect or patent foramen ovale was identified. - Impressions: LVEDP elevated by E/E&' ratio.  Impressions: - LVEDP elevated by E/E&' ratio.  Tele: a-fib, rate controlled   ASSESSMENT AND PLAN  Active Problems:  Vulvar cancer, carcinoma  Hyperlipidemia  Hypothyroidism  Leukocytosis  CVA (cerebral infarction)  Type II or  unspecified type diabetes mellitus with renal manifestations, uncontrolled(250.42)  Anemia  Obesity  Atrial fibrillation , chronic  Chronic kidney disease, stage III (moderate)  Vulva cancer  Chronic anticoagulation  GI bleed  80 y.o. female with h/o HTN, HLP, chronic a-fib on chronic anticoagulation with coumadine, s/p radical vulvectomy with colostomy, total abdominal hysterectomy and B/L salpingo opherectomy on 05/12/2014. Acute GI bleed with coffee ground emesis on 5/26 after anticoagulation restarted, EGD showed distal esophageal ulcers and probable Barrett's with GE junction probable ulcerative mass-biopsied.   A-fib with RVR - rate control management and chronic anticoagulation - CHADS2-VASc score is 5, including prior h/o stroke. In the acute GIB settings we agree with holding the full anticoagulation but would strongly recommend to restart once acute bleeding has resolved especially since she has prior h/o CVAs. I would restart anticoagulation based on GI recommendation.    Signed, Dorothy Spark, MD, [facility] 05/21/2014, 11:52 AM

## 2014-05-21 NOTE — Plan of Care (Signed)
Problem: Phase II Progression Outcomes Goal: Tolerating diet Outcome: Progressing Patient is still not eating but just a couple of bites at each meal. Complains of pain swallowing from ulcers.  She does better with soft foods.

## 2014-05-21 NOTE — Consult Note (Signed)
WOC ostomy follow up Stoma type/location: LLQ Colostomy Stomal assessment/size: 1 and 1/4 inches, round, budded, moist and edematous in small gulley Peristomal assessment: intact, clear Treatment options for stomal/peristomal skin: skin barrier ring for gulley Output small amount of dark green/black stool Ostomy pouching: 2pc. 2 and 1/4 inches with skin barrier ring Education provided: Patient's husband observed pouch change and is asking appropriate questions.  Patient is irritable today and cannot get comfortable.  Bedside RN notified and will medicate for pain immediately after [place] Nurse visit. Enrolled patient in [place] Start Discharge program: Yes [place] nursing team will follow, and will remain available to this patient, the nursing and medical team.  Please re-consult if needed. Thanks, Maudie Flakes, MSN, RN, [place], [place], [place] [phone])

## 2014-05-22 ENCOUNTER — Ambulatory Visit: Payer: Self-pay | Admitting: Gynecology

## 2014-05-22 LAB — CBC
HCT: 31.5 % — ABNORMAL LOW (ref 36.0–46.0)
Hemoglobin: 10 g/dL — ABNORMAL LOW (ref 12.0–15.0)
MCH: 30.8 pg (ref 26.0–34.0)
MCHC: 31.7 g/dL (ref 30.0–36.0)
MCV: 96.9 fL (ref 78.0–100.0)
PLATELETS: 168 10*3/uL (ref 150–400)
RBC: 3.25 MIL/uL — AB (ref 3.87–5.11)
RDW: 17.9 % — AB (ref 11.5–15.5)
WBC: 8.2 10*3/uL (ref 4.0–10.5)

## 2014-05-22 LAB — BASIC METABOLIC PANEL
BUN: 16 mg/dL (ref 6–23)
CHLORIDE: 102 meq/L (ref 96–112)
CO2: 26 meq/L (ref 19–32)
Calcium: 8.1 mg/dL — ABNORMAL LOW (ref 8.4–10.5)
Creatinine, Ser: 1.28 mg/dL — ABNORMAL HIGH (ref 0.50–1.10)
GFR calc Af Amer: 43 mL/min — ABNORMAL LOW (ref >90)
GFR calc non Af Amer: 37 mL/min — ABNORMAL LOW (ref >90)
Glucose, Bld: 221 mg/dL — ABNORMAL HIGH (ref 70–99)
POTASSIUM: 4.6 meq/L (ref 3.7–5.3)
SODIUM: 139 meq/L (ref 137–147)

## 2014-05-22 LAB — GLUCOSE, CAPILLARY
GLUCOSE-CAPILLARY: 205 mg/dL — AB (ref 70–99)
GLUCOSE-CAPILLARY: 206 mg/dL — AB (ref 70–99)
GLUCOSE-CAPILLARY: 235 mg/dL — AB (ref 70–99)
Glucose-Capillary: 205 mg/dL — ABNORMAL HIGH (ref 70–99)

## 2014-05-22 MED ORDER — ENOXAPARIN SODIUM 80 MG/0.8ML ~~LOC~~ SOLN
1.0000 mg/kg | Freq: Two times a day (BID) | SUBCUTANEOUS | Status: DC
  Filled 2014-05-22 (×2): qty 0.8

## 2014-05-22 MED ORDER — INSULIN GLARGINE 100 UNIT/ML ~~LOC~~ SOLN
10.0000 [IU] | Freq: Every day | SUBCUTANEOUS | Status: DC
Start: 2014-05-22 — End: 2014-05-25
  Administered 2014-05-22 – 2014-05-24 (×3): 10 [IU] via SUBCUTANEOUS
  Filled 2014-05-22 (×4): qty 0.1

## 2014-05-22 MED ORDER — ENOXAPARIN SODIUM 30 MG/0.3ML ~~LOC~~ SOLN: 30.0000 mg | SUBCUTANEOUS | Status: DC

## 2014-05-22 MED ORDER — INSULIN GLARGINE 100 UNIT/ML ~~LOC~~ SOLN: 5.0000 [IU] | Freq: Every day | SUBCUTANEOUS | Status: DC

## 2014-05-22 MED ORDER — INSULIN ASPART 100 UNIT/ML ~~LOC~~ SOLN
0.0000 [IU] | Freq: Every day | SUBCUTANEOUS | Status: DC
Start: 2014-05-22 — End: 2014-05-25
  Administered 2014-05-24: 2 [IU] via SUBCUTANEOUS

## 2014-05-22 MED ORDER — INSULIN ASPART 100 UNIT/ML ~~LOC~~ SOLN
0.0000 [IU] | Freq: Three times a day (TID) | SUBCUTANEOUS | Status: DC
Start: 2014-05-22 — End: 2014-05-22
  Administered 2014-05-22: 5 [IU] via SUBCUTANEOUS

## 2014-05-22 MED ORDER — ENOXAPARIN SODIUM 80 MG/0.8ML ~~LOC~~ SOLN
1.0000 mg/kg | Freq: Two times a day (BID) | SUBCUTANEOUS | Status: DC
  Administered 2014-05-22 – 2014-05-25 (×6): 70 mg via SUBCUTANEOUS
  Filled 2014-05-22 (×7): qty 0.8

## 2014-05-22 MED ORDER — POTASSIUM CHLORIDE CRYS ER 10 MEQ PO TBCR
10.0000 meq | EXTENDED_RELEASE_TABLET | Freq: Every day | ORAL | Status: DC
  Administered 2014-05-23 – 2014-05-25 (×3): 10 meq via ORAL
  Filled 2014-05-22 (×5): qty 1

## 2014-05-22 MED ORDER — INSULIN ASPART 100 UNIT/ML ~~LOC~~ SOLN
0.0000 [IU] | Freq: Three times a day (TID) | SUBCUTANEOUS | Status: DC
  Administered 2014-05-23: 1 [IU] via SUBCUTANEOUS
  Administered 2014-05-23 – 2014-05-24 (×3): 2 [IU] via SUBCUTANEOUS
  Administered 2014-05-24: 1 [IU] via SUBCUTANEOUS
  Administered 2014-05-24: 3 [IU] via SUBCUTANEOUS
  Administered 2014-05-25: 1 [IU] via SUBCUTANEOUS

## 2014-05-22 NOTE — Progress Notes (Signed)
Called and left message for Dr. Delsa Sale regarding patient's Lovenox.  Will hold lovenox for now. Dr. Algis Liming is aware.

## 2014-05-22 NOTE — Progress Notes (Signed)
[facility] Medical Consultation - Progress Note  [name] NUU:[id number] DOB: [birth year] DOA: 05/12/2014 PCP: Elayne Snare, MD   Requesting physician: Dr. 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 GI bleed - Dr. Watt Climes following. Hemoglobin stable. Status post EGD 5/27-moderate hiatal hernia with distal esophageal ulcers and probable Barrett's with GE junction probable ulcerative mass- pathology results as below- no malignancy. Discussed with Dr. Watt Climes- Continue twice a day PPI, OK to start Coumadin and will need repeat EGD to look at GE junction in 2 months- discussed with patient who verbalized understanding.  2. IDDM - uncontrolled. Increase Lantus from 5>10 units QHS. Continue SSI 3. Chronic anticoagulation - was held #1. She has had CVAs and is currently in A fib. Primary service has started on full dose Lovenox. As per Cardiology notes and discussion with Dr. Irish Lack- recommends no Lovenox bridging and start Coumadin and let INR gradually drift up. May use DVT prophylactic dose Lovenox until INR > 1.8. Discussed with Dr. [name] re same.  4. A fib - recently seen by Dr. Marlou Porch pre-op, with moderate risk. She is on Diltiazem 180 mg daily and on Metoprolol 50 mg BID. In the setting of hypotension held Diltiazem initially. Continued Metoprolol since it doesn't affect the BP as much and diltiazem had to be restarted on 5/26 due to intermittent tachycardia. 2D echo done 5/23 with normal EF, mild AS. Controlled ventricular  rate. High risk for cardio embolic events-Anticoagulation discussion as above. 5. Aortic stenosis - last 2D echo 55-60% with moderate AS (03/2012). No significant change now.  6. Prior CVA with residual left sided weakness 7. S/p radical vulvectomy with colostomy, total abdominal hysterectomy and bilateral salpingoopherectomy for recurrent vulvar cancer - per primary team. Empiric Zosyn.  8. Acute kidney injury:? Secondary to intravascular volume depletion versus urinary retention. Foley placement. Encourage by mouth fluids. Improved 9. Hypokalemia: Replaced. Mg 1.5 10. Anemia: Stable. 11. Thrombocytopenia: Stable 12. Hypothyroid: Continue Synthroid  I will followup again tomorrow. Please contact me if I can be of assistance in the meanwhile. Thank you for this consultation.  Subjective: Chronic intermittent b/l leg soreness- describes as becoming restless. Denies pain, numbness, tingling or burning.  Physical Exam: Blood pressure 97/61, pulse 94, temperature 98.2 F (36.8 C), temperature source Oral, resp. rate 20, height 5\' 4"  (1.626 m), weight 69.3 kg (152 lb 12.5 oz), SpO2 100.00%. Filed Vitals:   05/22/14 1422  BP: 97/61  Pulse:   Temp:   Resp:     General:  Elderly female lying in bed and was in no obvious distress.  Cardiovascular: irregular, 3/6 SEM. Telemetry: Atrial fibrillation with controlled ventricular rate.  Respiratory: clear, moves air well, no wheezing. No increased work of breathing.  Abdomen: mildly tender. Laparotomy site intact. Colostomy +.  Skin: no rashes  Musculoskeletal: no edema  Neurologic: moves all 4, mild left sided residual weakness; follows commands well  Labs on Admission:  Basic Metabolic Panel:  Recent Labs Lab 05/18/14 0535 05/19/14 0535 05/20/14 0605 05/21/14 1830 05/22/14 0440  NA 143 141 140 140  139  K 3.2* 3.5* 3.4* 4.2 4.6  CL 104 102 101 102 102  CO2 29 28 28 27 26   GLUCOSE 58* 160* 126* 207* 221*  BUN 29* 26* 24* 18 16   CREATININE 1.26* 1.45* 1.50* 1.33* 1.28*  CALCIUM 8.0* 8.0* 7.9* 8.0* 8.1*  MG 1.3*  --   --  1.5  --   PHOS 2.7  --   --   --   --    Liver Function Tests:  Recent Labs Lab 05/16/14 0445  AST 15  ALT 10  ALKPHOS 61  BILITOT 0.8  PROT 4.9*  ALBUMIN 1.9*   CBC:  Recent Labs Lab 05/18/14 0535 05/18/14 1500 05/18/14 2155 05/19/14 0535 05/22/14 0440  WBC 10.0 10.9* 10.2 9.3 8.2  HGB 9.6* 9.2* 9.6* 9.6* 10.0*  HCT 27.6* 27.9* 28.9* 28.9* 31.5*  MCV 89.9 90.6 91.7 92.6 96.9  PLT 139* 143* 141* 137* 168   CBG:  Recent Labs Lab 05/21/14 1725 05/21/14 2205 05/22/14 0714 05/22/14 1138 05/22/14 1624  GLUCAP 173* 205* 205* 206* 235*    Radiological Exams on Admission: No results found.  [name] acetaminophen  1,000 mg Oral Q6H  . antiseptic oral rinse  15 mL Mouth Rinse q12n4p  . atorvastatin  20 mg Oral Daily  . chlorhexidine  15 mL Mouth Rinse BID  . diltiazem  120 mg Oral Daily  . enoxaparin (LOVENOX) injection  1 mg/kg Subcutaneous Q12H  . feeding supplement (GLUCERNA SHAKE)  237 mL Oral TID WC  . furosemide  20 mg Oral Daily  . insulin aspart  0-15 Units Subcutaneous TID WC  . insulin glargine  5 Units Subcutaneous QHS  . levothyroxine  100 mcg Oral Once per day on Mon Tue Wed Thu Fri Sat  . magnesium oxide  200 mg Oral Daily  . metoprolol  50 mg Oral BID  . pantoprazole  40 mg Oral BID AC  . piperacillin-tazobactam  3.375 g Intravenous 3 times per day  . potassium chloride  10 mEq Oral Daily  . traMADol  50 mg Oral 4 times per day     Time spent: [place], MD, FACP, [facility]. [facility] Pager [phone]  If 7PM-7AM, please contact night-coverage [email or web address] Password [email or web address] 05/22/2014, 5:55 PM  05/22/2014, 5:55 PM

## 2014-05-22 NOTE — Progress Notes (Signed)
Physical Therapy Treatment Patient Details Name: CLARINDA OBI MRN: [id number] DOB: [birth year] Today's Date: 05/22/2014    History of Present Illness  admitted 05/12/14 with recurrent vulva cancer. S/P abdominal perineum resection,TAH, diverting colostomy on 05/12/14. Post op hypotension.    PT Comments    Assisted OOVB to amb in hallway twice with one long sitting rest break.  Follow Up Recommendations  SNF     Equipment Recommendations       Recommendations for Other Services       Precautions / Restrictions Precautions Precautions: Fall Precaution Comments: abdominal drain, colostomy, sacral cushion Restrictions Weight Bearing Restrictions: No    Mobility  Bed Mobility Overal bed mobility: Needs Assistance;+ 2 for safety/equipment Bed Mobility: Rolling;Sidelying to Sit Rolling: Max assist Sidelying to sit: Max assist;+2 for safety/equipment       General bed mobility comments: increased time esp with upper body due to ABD pain  Transfers Overall transfer level: Needs assistance Equipment used: Rolling walker (2 wheeled) Transfers: Sit to/from Stand Sit to Stand: +2 safety/equipment;Max assist         General transfer comment: 25% VC's on proper tech and hand placement as we assisted OOB to [facility] then to standing.    Ambulation/Gait Ambulation/Gait assistance: +2 safety/equipment;+2 physical assistance;Mod assist Ambulation Distance (Feet): 50 Feet (25 feet x 2) Assistive device: Rolling walker (2 wheeled) Gait Pattern/deviations: Step-to pattern;Step-through pattern;Trunk flexed Gait velocity: decreased   General Gait Details: pt required increased time and VC's for proper walker to self distance and safety negociating around obsticles.    Stairs            Wheelchair Mobility    Modified Rankin (Stroke Patients Only)       Balance                                    Cognition                            Exercises       General Comments        Pertinent Vitals/Pain C/o sacral pain with sitting    Home Living                      Prior Function            PT Goals (current goals can now be found in the care plan section) Progress towards PT goals: Progressing toward goals    Frequency  Min 3X/week    PT Plan      Co-evaluation             End of Session Equipment Utilized During Treatment: Gait belt Activity Tolerance: Patient limited by fatigue;Patient limited by pain Patient left: in chair;with call bell/phone within reach;with nursing/sitter in room     Time: 1200-1227 PT Time Calculation (min): 27 min  Charges:  $Gait Training: 8-22 mins $Therapeutic Activity: 8-22 mins                    G Codes:      Rica Koyanagi  PTA [facility]  Acute  Rehab Pager      [phone]

## 2014-05-22 NOTE — Progress Notes (Signed)
Uneventful night for patient. Pt slept for most of night and only needed scheduled Tramadol. Pt refused Tylenol stating it doesn't feel like it does anything.  Verlon Au, RN, BSN 6:37 AM 05/22/2014

## 2014-05-22 NOTE — Progress Notes (Signed)
Patient ID: [name], female   DOB: Nov 22, 1926, 80 y.o.   MRN: [id number] 2 Days Post-Op Procedure(s) (LRB): ESOPHAGOGASTRODUODENOSCOPY (EGD) (N/A)  Subjective: Patient w/o new complaints  Objective: Vital signs in last 24 hours: Temp:  [97.6 F (36.4 C)-98.4 F (36.9 C)] 98.3 F (36.8 C) (05/29 0500) Pulse Rate:  [72-89] 89 (05/29 0500) Resp:  [18-22] 22 (05/29 0500) BP: (109-132)/(60-80) 132/80 mmHg (05/29 0500) SpO2:  [94 %-95 %] 95 % (05/29 0500) Weight:  [69.3 kg (152 lb 12.5 oz)] 69.3 kg (152 lb 12.5 oz) (05/29 0500) Last BM Date: 05/20/14  Intake/Output from previous day: 05/28 0701 - 05/29 0700 In: 2120 [P.O.:800; I.V.:1220; IV Piggyback:100] Out: 1505 [Urine:1200; Drains:305]  CBG (last 3)   Recent Labs  05/21/14 1725 05/21/14 2205 05/22/14 0714  GLUCAP 173* 205* 205*    Physical Examination: General: alert Resp: good air movement Cardio: irregular rhythm GI: soft, non-tender; bowel sounds normal;  incision: clean, dry and intact; stoma pink Extremities: extremities normal, atraumatic, no cyanosis or edema Pelvic: dressing removed/replaced; wound w/granulation tissue  Labs: WBC/Hgb/Hct/Plts:  8.2/10.0/31.5/168 (05/29 0440) BUN/Cr/glu/ALT/AST/amyl/lip:  16/1.28/--/--/--/--/-- (05/29 0440)  CBG (last 3)   Recent Labs  05/21/14 1725 05/21/14 2205 05/22/14 0714  GLUCAP 173* 205* 205*     Assessment:  80 y.o. s/p Procedure(s): RADICAL VULVECTOMY WITH COLOSTOMY EXPLORATORY LAPAROTOMY, TOTAL ABDO MINAL HYSTERECTOMY AND BILATERAL SALPINGOOPHERECTOMY: stable Pain:  Pain is controlled on oral pain medicaions  Heme: Anemia: hemoglobin stable  CV: Arrhythmia: chronic atrial fibrilation Hypertension: B/Ps in the low normal range. Current treatment:  metoprolol (Lopressor, Toprol). Cardiology consult note appreciated.   GI:  Acute GI bleeding--resolved Peptic ulcer disease  FEN: Electrolytes in range   Pulm:  H/o chronic SOB; pulmonary status  stable  Endo: Diabetes mellitus Type II, under adequate control.[name]     Prophylaxis: SCDs  GU: Urinary retention in the setting of recent radical pelvic surgical procedure   ID:  Afebrile on antibiotics Plan:  Will need to restart Lovenox when felt appropriate from GI standpoint/risk of bleeding Encourage ambulation Continue PPI Continue Zosyn to complete 1 week course  Continue dressing changes BID      LOS: 10 days    [name] 05/22/2014, 7:53 AM

## 2014-05-22 NOTE — Progress Notes (Signed)
Unfortunately I am stuck at the other hospital and cannot see [name]. today however I discussed her case with the GYN team and the hospital team and her biopsies are okay and I am okay with her starting Coumadin and she should have a repeat endoscopy in 2 months just to be sure there is no significant abnormality at the GE junction and she should be on twice a day pump inhibitor until then and please call my partner this weekend if he can be of any further assistance

## 2014-05-22 NOTE — Progress Notes (Signed)
SUBJECTIVE:  Feels ok.  Her feet ache  OBJECTIVE:   Vitals:   Filed Vitals:   05/21/14 0552 05/21/14 1449 05/21/14 2150 05/22/14 0500  BP: 133/60 109/69 109/60 132/80  Pulse: 75 72 87 89  Temp: 97.9 F (36.6 C) 97.6 F (36.4 C) 98.4 F (36.9 C) 98.3 F (36.8 C)  TempSrc: Oral Oral Oral Oral  Resp: 18  18 22   Height:      Weight: 151 lb 7.3 oz (68.7 kg)   152 lb 12.5 oz (69.3 kg)  SpO2: 98% 94% 95% 95%   I&O's:   Intake/Output Summary (Last 24 hours) at 05/22/14 1027 Last data filed at 05/22/14 9678  Gross per 24 hour  Intake   2120 ml  Output   1565 ml  Net    555 ml   TELEMETRY: Reviewed telemetry pt in AFib, rate controlled:     PHYSICAL EXAM General: Well developed, well nourished, frail, in no acute distress Head:   Normal cephalic and atramatic  Lungs:  No wheezing Heart:  Irregularly irregular S1 S2  No JVD.   Abdomen: abdomen soft and non-tender  Extremities:   No edema.   Neuro: Alert     LABS: Basic Metabolic Panel:  Recent Labs  05/21/14 1830 05/22/14 0440  NA 140 139  K 4.2 4.6  CL 102 102  CO2 27 26  GLUCOSE 207* 221*  BUN 18 16  CREATININE 1.33* 1.28*  CALCIUM 8.0* 8.1*  MG 1.5  --    Liver Function Tests: No results found for this basename: AST, ALT, ALKPHOS, BILITOT, PROT, ALBUMIN,  in the last 72 hours No results found for this basename: LIPASE, AMYLASE,  in the last 72 hours CBC:  Recent Labs  05/22/14 0440  WBC 8.2  HGB 10.0*  HCT 31.5*  MCV 96.9  PLT 168   Cardiac Enzymes: No results found for this basename: CKTOTAL, CKMB, CKMBINDEX, TROPONINI,  in the last 72 hours BNP: No components found with this basename: POCBNP,  D-Dimer: No results found for this basename: DDIMER,  in the last 72 hours Hemoglobin A1C: No results found for this basename: HGBA1C,  in the last 72 hours Fasting Lipid Panel: No results found for this basename: CHOL, HDL, LDLCALC, TRIG, CHOLHDL, LDLDIRECT,  in the last 72 hours Thyroid  Function Tests: No results found for this basename: TSH, T4TOTAL, FREET3, T3FREE, THYROIDAB,  in the last 72 hours Anemia Panel: No results found for this basename: VITAMINB12, FOLATE, FERRITIN, TIBC, IRON, RETICCTPCT,  in the last 72 hours Coag Panel:   Lab Results  Component Value Date   INR 1.41 05/18/2014   INR 6.52* 05/17/2014   INR 2.58* 05/16/2014    RADIOLOGY: Dg Chest 1 View  05/16/2014   CLINICAL DATA:  Shortness of breath, vomiting  EXAM: CHEST - 1 VIEW  COMPARISON:  05/04/2014  FINDINGS: Mild cardiomegaly. Mild interstitial edema/ infiltrates predominant perihilar, right worse than left. . No definite effusion.  Atheromatous aorta. Spurring in the thoracic spine.  IMPRESSION: 1. Cardiomegaly with asymmetric edema/infiltrates, right worse than left.   Electronically Signed   By: Arne Cleveland M.D.   On: 05/16/2014 11:12   Chest 2 View  05/04/2014   CLINICAL DATA:  Preoperative radiograph.  Vulvar cancer.  EXAM: CHEST  2 VIEW  COMPARISON:  NM PET IMAGE RESTAG (PS) SKULL BASE TO THIGH dated 10/07/2013; DG CHEST 2 VIEW dated 02/23/2012  FINDINGS: Dense mitral annular calcification. Bilateral pleural apical scarring. No airspace  disease or effusion. Chronic bronchitic changes. Configuration of the hila appears similar to the prior exam. Aortic arch atherosclerosis.  IMPRESSION: No acute cardiopulmonary disease.  Chronic changes of the chest.   Electronically Signed   By: Dereck Ligas M.D.   On: 05/04/2014 16:14   Dg Abd 1 View  05/16/2014   CLINICAL DATA:  Postop APR; nausea, vomiting  EXAM: ABDOMEN - 1 VIEW  COMPARISON:  PET-CT 10/07/2013  FINDINGS: Surgical staples project over the right lower quadrant. Surgical drain in the pelvis. A few gas distended mid abdominal small bowel loops. Normal distribution comparisons will throughout colon. Cortical loss at the superior margin of the right pubic bone.  IMPRESSION: Nonobstructive bowel gas pattern.   Electronically Signed   By: Arne Cleveland M.D.   On: 05/16/2014 11:14   Dg Chest Port 1 View  05/16/2014   CLINICAL DATA:  PICC placement  EXAM: PORTABLE CHEST - 1 VIEW  COMPARISON:  Earlier film of the same day  FINDINGS: Left arm PICC line extends to the cavoatrial junction. Stable cardiomegaly. Some worsening of bilateral edema/ infiltrates, right greater than left. . No effusion. Surgical clips above the thoracic inlet. Regional bones unremarkable.  IMPRESSION: 1. PICC line to cavoatrial junction. 2. Interval worsening of bilateral edema/infiltrates, right worse than left.   Electronically Signed   By: Arne Cleveland M.D.   On: 05/16/2014 13:31      ASSESSMENT: AFib, high CHADs score; GI bleed-unclear source- awaiting pathology from biopsy  PLAN:  Would start Coumadin back for stroke prevention.  Let INR drift up.  Significant bleeding risk with lovenox bridge post GI biopsy.    AFib-rate controlled.  Jettie Booze, MD  05/22/2014  10:27 AM

## 2014-05-22 NOTE — Progress Notes (Signed)
Discussed use of Coumadin with Dr. Fermin Schwab.  In light of recent bleeding on Coumadin, full anticoagulation with lovenox is preferred.  He would like to await more recovery of the patient's baseline metabolism to avoid Coumadin toxicity.

## 2014-05-23 LAB — GLUCOSE, CAPILLARY
GLUCOSE-CAPILLARY: 148 mg/dL — AB (ref 70–99)
GLUCOSE-CAPILLARY: 173 mg/dL — AB (ref 70–99)
Glucose-Capillary: 139 mg/dL — ABNORMAL HIGH (ref 70–99)
Glucose-Capillary: 190 mg/dL — ABNORMAL HIGH (ref 70–99)
Glucose-Capillary: 152 mg/dL — ABNORMAL HIGH (ref 70–99)

## 2014-05-23 LAB — CBC
HCT: 31.4 % — ABNORMAL LOW (ref 36.0–46.0)
Hemoglobin: 10.2 g/dL — ABNORMAL LOW (ref 12.0–15.0)
MCH: 31.3 pg (ref 26.0–34.0)
MCHC: 32.5 g/dL (ref 30.0–36.0)
MCV: 96.3 fL (ref 78.0–100.0)
Platelets: 201 10*3/uL (ref 150–400)
RBC: 3.26 MIL/uL — ABNORMAL LOW (ref 3.87–5.11)
RDW: 18.1 % — AB (ref 11.5–15.5)
WBC: 9.4 10*3/uL (ref 4.0–10.5)

## 2014-05-23 NOTE — Progress Notes (Addendum)
SUBJECTIVE:  Feels ok.    OBJECTIVE:   Vitals:   Filed Vitals:   05/22/14 1419 05/22/14 1422 05/22/14 2137 05/23/14 0435  BP: 84/51 97/61 108/61 130/61  Pulse: 94  86 76  Temp: 98.2 F (36.8 C)  97.8 F (36.6 C) 97.4 F (36.3 C)  TempSrc: Oral  Oral Oral  Resp: 20  20 19   Height:      Weight:    154 lb 5.2 oz (70 kg)  SpO2: 100%  99% 99%   I&O's:    Intake/Output Summary (Last 24 hours) at 05/23/14 0905 Last data filed at 05/23/14 0525  Gross per 24 hour  Intake    660 ml  Output   1650 ml  Net   -990 ml   TELEMETRY: Reviewed telemetry pt in AFib, rate controlled:     PHYSICAL EXAM General: Well developed, well nourished, frail, in no acute distress Head:   Normal cephalic and atramatic  Lungs:  No wheezing Heart:  Irregularly irregular S1 S2  No JVD.   Abdomen: abdomen soft and non-tender  Extremities:   No edema.   Neuro: Alert     LABS: Basic Metabolic Panel:  Recent Labs  05/21/14 1830 05/22/14 0440  NA 140 139  K 4.2 4.6  CL 102 102  CO2 27 26  GLUCOSE 207* 221*  BUN 18 16  CREATININE 1.33* 1.28*  CALCIUM 8.0* 8.1*  MG 1.5  --    Liver Function Tests: No results found for this basename: AST, ALT, ALKPHOS, BILITOT, PROT, ALBUMIN,  in the last 72 hours No results found for this basename: LIPASE, AMYLASE,  in the last 72 hours CBC:  Recent Labs  05/22/14 0440 05/23/14 0423  WBC 8.2 9.4  HGB 10.0* 10.2*  HCT 31.5* 31.4*  MCV 96.9 96.3  PLT 168 201   Cardiac Enzymes: No results found for this basename: CKTOTAL, CKMB, CKMBINDEX, TROPONINI,  in the last 72 hours BNP: No components found with this basename: POCBNP,  D-Dimer: No results found for this basename: DDIMER,  in the last 72 hours Hemoglobin A1C: No results found for this basename: HGBA1C,  in the last 72 hours Fasting Lipid Panel: No results found for this basename: CHOL, HDL, LDLCALC, TRIG, CHOLHDL, LDLDIRECT,  in the last 72 hours Thyroid Function Tests: No results  found for this basename: TSH, T4TOTAL, FREET3, T3FREE, THYROIDAB,  in the last 72 hours Anemia Panel: No results found for this basename: VITAMINB12, FOLATE, FERRITIN, TIBC, IRON, RETICCTPCT,  in the last 72 hours Coag Panel:   Lab Results  Component Value Date   INR 1.41 05/18/2014   INR 6.52* 05/17/2014   INR 2.58* 05/16/2014    RADIOLOGY: Dg Chest 1 View  05/16/2014   CLINICAL DATA:  Shortness of breath, vomiting  EXAM: CHEST - 1 VIEW  COMPARISON:  05/04/2014  FINDINGS: Mild cardiomegaly. Mild interstitial edema/ infiltrates predominant perihilar, right worse than left. . No definite effusion.  Atheromatous aorta. Spurring in the thoracic spine.  IMPRESSION: 1. Cardiomegaly with asymmetric edema/infiltrates, right worse than left.   Electronically Signed   By: Arne Cleveland M.D.   On: 05/16/2014 11:12   Chest 2 View  05/04/2014   CLINICAL DATA:  Preoperative radiograph.  Vulvar cancer.  EXAM: CHEST  2 VIEW  COMPARISON:  NM PET IMAGE RESTAG (PS) SKULL BASE TO THIGH dated 10/07/2013; DG CHEST 2 VIEW dated 02/23/2012  FINDINGS: Dense mitral annular calcification. Bilateral pleural apical scarring. No airspace disease or  effusion. Chronic bronchitic changes. Configuration of the hila appears similar to the prior exam. Aortic arch atherosclerosis.  IMPRESSION: No acute cardiopulmonary disease.  Chronic changes of the chest.   Electronically Signed   By: Dereck Ligas M.D.   On: 05/04/2014 16:14   Dg Abd 1 View  05/16/2014   CLINICAL DATA:  Postop APR; nausea, vomiting  EXAM: ABDOMEN - 1 VIEW  COMPARISON:  PET-CT 10/07/2013  FINDINGS: Surgical staples project over the right lower quadrant. Surgical drain in the pelvis. A few gas distended mid abdominal small bowel loops. Normal distribution comparisons will throughout colon. Cortical loss at the superior margin of the right pubic bone.  IMPRESSION: Nonobstructive bowel gas pattern.   Electronically Signed   By: Arne Cleveland M.D.   On: 05/16/2014  11:14   Dg Chest Port 1 View  05/16/2014   CLINICAL DATA:  PICC placement  EXAM: PORTABLE CHEST - 1 VIEW  COMPARISON:  Earlier film of the same day  FINDINGS: Left arm PICC line extends to the cavoatrial junction. Stable cardiomegaly. Some worsening of bilateral edema/ infiltrates, right greater than left. . No effusion. Surgical clips above the thoracic inlet. Regional bones unremarkable.  IMPRESSION: 1. PICC line to cavoatrial junction. 2. Interval worsening of bilateral edema/infiltrates, right worse than left.   Electronically Signed   By: Arne Cleveland M.D.   On: 05/16/2014 13:31      ASSESSMENT: AFib, high CHADs score; GI bleed-unclear source- awaiting pathology from biopsy  PLAN:  Anticoagulation recommended.  GYN preferred Lovenox over Coumadin due to metabolism issues.  If using Lovenox, will need to follow renal function closely.  AFib-rate controlled.  No evidence of CHF.  Jettie Booze, MD  05/23/2014  9:05 AM

## 2014-05-23 NOTE — Progress Notes (Signed)
Patient ID: [name], female   DOB: Apr 25, 1926, 80 y.o.   MRN: [id number] 3 Days Post-Op Procedure(s) (LRB): ESOPHAGOGASTRODUODENOSCOPY (EGD) (N/A)  Subjective: Patient w/o new complaints  Objective: Vital signs in last 24 hours: Temp:  [97.4 F (36.3 C)-98.2 F (36.8 C)] 97.4 F (36.3 C) (05/30 0435) Pulse Rate:  [76-94] 76 (05/30 0435) Resp:  [19-20] 19 (05/30 0435) BP: (84-130)/(51-61) 130/61 mmHg (05/30 0435) SpO2:  [99 %-100 %] 99 % (05/30 0435) Weight:  [70 kg (154 lb 5.2 oz)] 70 kg (154 lb 5.2 oz) (05/30 0435) Last BM Date: 05/21/14  Intake/Output from previous day: 05/29 0701 - 05/30 0700 In: 900 [P.O.:700; IV Piggyback:200] Out: 6503 [Urine:1350; Drains:400]  CBG (last 3)   Recent Labs  05/22/14 1624 05/22/14 2145 05/23/14 0750  GLUCAP 235* 148* 139*    Physical Examination: General: alert Resp: good air movement Cardio: irregular rhythm GI: soft, non-tender; bowel sounds normal;  incision: clean, dry and intact; stoma pink Extremities: extremities normal, atraumatic, no cyanosis or edema Pelvic: dressing removed/replaced; wound w/granulation tissue  Labs: WBC/Hgb/Hct/Plts:  9.4/10.2/31.4/201 (05/30 0423)    CBG (last 3)   Recent Labs  05/22/14 1624 05/22/14 2145 05/23/14 0750  GLUCAP 235* 148* 139*     Assessment:  80 y.o. s/p Procedure(s): RADICAL VULVECTOMY WITH COLOSTOMY EXPLORATORY LAPAROTOMY, TOTAL ABDO MINAL HYSTERECTOMY AND BILATERAL SALPINGOOPHERECTOMY: stable Pain:  Pain is controlled on oral pain medicaions  Heme: Anemia: hemoglobin stable  CV: Arrhythmia: chronic atrial fibrilation Hypertension: B/Ps in the low normal range. Current treatment:  metoprolol (Lopressor, Toprol)/diltiazem.  GI:  Peptic ulcer disease  Pulm:  H/o chronic SOB; pulmonary status stable  Endo: Diabetes mellitus Type II, under inadequate control.[name]     Prophylaxis: SCDs/full anti-coagulation on LMWH  GU: Urinary retention in the setting of  recent radical pelvic surgical procedure --foley catheter in place  ID:  Afebrile on antibiotics Plan:  Follow anti factor Xa levels/renal function Catheter care Encourage ambulation Continue PPI Discontinue Zosyn  Appreciate adjustments to insulin regimen by Hospitalist service Continue dressing changes BID      LOS: 11 days    [name] 05/23/2014, 11:14 AM

## 2014-05-23 NOTE — Progress Notes (Signed)
[facility] Medical Consultation - Progress Note  [name] HYQ:[id number] DOB: Jun 25, 1926 DOA: 05/12/2014 PCP: Elayne Snare, MD   Requesting physician: Dr. 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 GI bleed - Dr. Watt Climes following. Hemoglobin stable. Status post EGD 5/27-moderate hiatal hernia with distal esophageal ulcers and probable Barrett's with GE junction probable ulcerative mass- pathology results as below- no malignancy. Discussed with Dr. Watt Climes 5/29- Continue twice a day PPI, OK to start Coumadin and will need repeat EGD to look at GE junction in 2 months- discussed with patient who verbalized understanding.  2. IDDM - uncontrolled. Increase Lantus from 5>10 units QHS. Continue SSI. Improved control. 3. Chronic anticoagulation - was held #1. She has had CVAs and is currently in A fib. Primary service has started on full dose Lovenox. As per Cardiology notes and discussion with Dr. Irish Lack on 5/29- recommends no Lovenox bridging and start Coumadin and let INR gradually drift up. May use DVT prophylactic dose Lovenox until INR > 1.8. Discussed with Dr. [name] re same.  Continues on LMWH. Defer anticoagulation Mx to primary service. 4. A fib - She is on Diltiazem 180 mg daily and on Metoprolol 50 mg BID. 2D echo done 5/23 with normal EF, mild AS. Controlled ventricular rate. High risk for cardio embolic events-Anticoagulation discussion as above. 5. Aortic stenosis - last 2D echo 55-60% with moderate AS (03/2012). No  significant change now.  6. Prior CVA with residual left sided weakness 7. S/p radical vulvectomy with colostomy, total abdominal hysterectomy and bilateral salpingoopherectomy for recurrent vulvar cancer - per primary team. Empiric Zosyn.  8. Acute kidney injury:? Secondary to intravascular volume depletion versus urinary retention. Foley placement. Encourage by mouth fluids. Improved 9. Hypokalemia: Replaced. Mg 1.5 10. Anemia: Stable. 11. Thrombocytopenia: Stable 12. Hypothyroid: Continue Synthroid  I will followup again tomorrow. Please contact me if I can be of assistance in the meanwhile. Thank you for this consultation.  Subjective: Denies complaints.  Physical Exam: Blood pressure 130/61, pulse 76, temperature 97.4 F (36.3 C), temperature source Oral, resp. rate 19, height 5\' 4"  (1.626 m), weight 70 kg (154 lb 5.2 oz), SpO2 99.00%. Filed Vitals:   05/23/14 0435  BP: 130/61  Pulse: 76  Temp: 97.4 F (36.3 C)  Resp: 19    General:  Elderly female lying in bed and was in no obvious distress.  Cardiovascular: irregular, 3/6 SEM. Telemetry: Atrial fibrillation with controlled ventricular rate.  Respiratory: clear, moves air well, no wheezing. No increased work of breathing.  Abdomen: mildly tender. Laparotomy site intact. Colostomy +.  Skin: no rashes  Musculoskeletal: no edema  Neurologic: moves all 4, mild left sided residual weakness; follows commands well  Labs on Admission:  Basic Metabolic Panel:  Recent Labs Lab 05/18/14 0535 05/19/14 0535 05/20/14 0605 05/21/14 1830 05/22/14 0440  NA 143 141 140 140 139  K 3.2* 3.5* 3.4* 4.2 4.6  CL 104 102 101 102 102  CO2 29 28 28 27 26   GLUCOSE 58* 160* 126* 207* 221*  BUN 29* 26* 24* 18  16  CREATININE 1.26* 1.45* 1.50* 1.33* 1.28*  CALCIUM 8.0* 8.0* 7.9* 8.0* 8.1*  MG 1.3*  --   --  1.5  --   PHOS 2.7  --   --   --   --    Liver Function Tests: No results found for this basename: AST, ALT, ALKPHOS,  BILITOT, PROT, ALBUMIN,  in the last 168 hours CBC:  Recent Labs Lab 05/18/14 1500 05/18/14 2155 05/19/14 0535 05/22/14 0440 05/23/14 0423  WBC 10.9* 10.2 9.3 8.2 9.4  HGB 9.2* 9.6* 9.6* 10.0* 10.2*  HCT 27.9* 28.9* 28.9* 31.5* 31.4*  MCV 90.6 91.7 92.6 96.9 96.3  PLT 143* 141* 137* 168 201   CBG:  Recent Labs Lab 05/22/14 1138 05/22/14 1624 05/22/14 2145 05/23/14 0750 05/23/14 1203  GLUCAP 206* 235* 148* 139* 190*    Radiological Exams on Admission: No results found.  [name] acetaminophen  1,000 mg Oral Q6H  . antiseptic oral rinse  15 mL Mouth Rinse q12n4p  . atorvastatin  20 mg Oral Daily  . chlorhexidine  15 mL Mouth Rinse BID  . diltiazem  120 mg Oral Daily  . enoxaparin (LOVENOX) injection  1 mg/kg Subcutaneous Q12H  . feeding supplement (GLUCERNA SHAKE)  237 mL Oral TID WC  . furosemide  20 mg Oral Daily  . insulin aspart  0-5 Units Subcutaneous QHS  . insulin aspart  0-9 Units Subcutaneous TID WC  . insulin glargine  10 Units Subcutaneous QHS  . levothyroxine  100 mcg Oral Once per day on Mon Tue Wed Thu Fri Sat  . magnesium oxide  200 mg Oral Daily  . metoprolol  50 mg Oral BID  . pantoprazole  40 mg Oral BID AC  . potassium chloride  10 mEq Oral Daily  . traMADol  50 mg Oral 4 times per day     Time spent: [place], MD, FACP, [facility]. [facility] Pager [phone]  If 7PM-7AM, please contact night-coverage [email or web address] Password [email or web address] 05/23/2014, 1:53 PM  05/23/2014, 1:53 PM

## 2014-05-24 LAB — HEPARIN ANTI-XA: Heparin LMW: 1.1 IU/mL

## 2014-05-24 LAB — CBC
HCT: 32.1 % — ABNORMAL LOW (ref 36.0–46.0)
Hemoglobin: 9.9 g/dL — ABNORMAL LOW (ref 12.0–15.0)
MCH: 29.9 pg (ref 26.0–34.0)
MCHC: 30.8 g/dL (ref 30.0–36.0)
MCV: 97 fL (ref 78.0–100.0)
PLATELETS: 211 10*3/uL (ref 150–400)
RBC: 3.31 MIL/uL — AB (ref 3.87–5.11)
RDW: 18.4 % — AB (ref 11.5–15.5)
WBC: 8.2 10*3/uL (ref 4.0–10.5)

## 2014-05-24 LAB — BASIC METABOLIC PANEL
BUN: 16 mg/dL (ref 6–23)
CALCIUM: 8.5 mg/dL (ref 8.4–10.5)
CO2: 27 meq/L (ref 19–32)
Chloride: 102 mEq/L (ref 96–112)
Creatinine, Ser: 1.32 mg/dL — ABNORMAL HIGH (ref 0.50–1.10)
GFR calc Af Amer: 41 mL/min — ABNORMAL LOW (ref >90)
GFR calc non Af Amer: 36 mL/min — ABNORMAL LOW (ref >90)
Glucose, Bld: 145 mg/dL — ABNORMAL HIGH (ref 70–99)
Potassium: 4.3 mEq/L (ref 3.7–5.3)
Sodium: 139 mEq/L (ref 137–147)

## 2014-05-24 LAB — GLUCOSE, CAPILLARY
Glucose-Capillary: 145 mg/dL — ABNORMAL HIGH (ref 70–99)
Glucose-Capillary: 228 mg/dL — ABNORMAL HIGH (ref 70–99)
Glucose-Capillary: 184 mg/dL — ABNORMAL HIGH (ref 70–99)

## 2014-05-24 MED ORDER — ALTEPLASE 2 MG IJ SOLR
2.0000 mg | Freq: Once | INTRAMUSCULAR | Status: AC
  Administered 2014-05-24: 2 mg
  Filled 2014-05-24: qty 2

## 2014-05-24 NOTE — Progress Notes (Signed)
SUBJECTIVE:  Feels ok. Left buttock pain, abdominal pain post suture removal.  OBJECTIVE:   Vitals:   Filed Vitals:   05/23/14 1426 05/23/14 2128 05/24/14 0558 05/24/14 0854  BP: 107/55 154/95 135/91 128/80  Pulse: 69 102 80 115  Temp: 97.3 F (36.3 C) 98.1 F (36.7 C) 98.2 F (36.8 C)   TempSrc: Oral Oral Oral   Resp: 20 20 18 20   Height:      Weight:   150 lb 2.1 oz (68.1 kg)   SpO2: 96% 96% 99% 100%   I&O's:    Intake/Output Summary (Last 24 hours) at 05/24/14 0910 Last data filed at 05/24/14 0900  Gross per 24 hour  Intake    240 ml  Output   1440 ml  Net  -1200 ml   TELEMETRY: Reviewed telemetry pt in AFib, rate controlled:     PHYSICAL EXAM General: Well developed, well nourished, frail, in no acute distress Head:   Normal cephalic and atramatic  Lungs:  No wheezing Heart:  Irregularly irregular S1 S2  No JVD.   Abdomen: abdomen soft and non-tender  Extremities:   No edema.   Neuro: Alert     LABS: Basic Metabolic Panel:  Recent Labs  05/21/14 1830 05/22/14 0440 05/24/14 0514  NA 140 139 139  K 4.2 4.6 4.3  CL 102 102 102  CO2 27 26 27   GLUCOSE 207* 221* 145*  BUN 18 16 16   CREATININE 1.33* 1.28* 1.32*  CALCIUM 8.0* 8.1* 8.5  MG 1.5  --   --    Liver Function Tests: No results found for this basename: AST, ALT, ALKPHOS, BILITOT, PROT, ALBUMIN,  in the last 72 hours No results found for this basename: LIPASE, AMYLASE,  in the last 72 hours CBC:  Recent Labs  05/23/14 0423 05/24/14 0514  WBC 9.4 8.2  HGB 10.2* 9.9*  HCT 31.4* 32.1*  MCV 96.3 97.0  PLT 201 211   Cardiac Enzymes: No results found for this basename: CKTOTAL, CKMB, CKMBINDEX, TROPONINI,  in the last 72 hours BNP: No components found with this basename: POCBNP,  D-Dimer: No results found for this basename: DDIMER,  in the last 72 hours Hemoglobin A1C: No results found for this basename: HGBA1C,  in the last 72 hours Fasting Lipid Panel: No results found for this  basename: CHOL, HDL, LDLCALC, TRIG, CHOLHDL, LDLDIRECT,  in the last 72 hours Thyroid Function Tests: No results found for this basename: TSH, T4TOTAL, FREET3, T3FREE, THYROIDAB,  in the last 72 hours Anemia Panel: No results found for this basename: VITAMINB12, FOLATE, FERRITIN, TIBC, IRON, RETICCTPCT,  in the last 72 hours Coag Panel:   Lab Results  Component Value Date   INR 1.41 05/18/2014   INR 6.52* 05/17/2014   INR 2.58* 05/16/2014    RADIOLOGY: Dg Chest 1 View  05/16/2014   CLINICAL DATA:  Shortness of breath, vomiting  EXAM: CHEST - 1 VIEW  COMPARISON:  05/04/2014  FINDINGS: Mild cardiomegaly. Mild interstitial edema/ infiltrates predominant perihilar, right worse than left. . No definite effusion.  Atheromatous aorta. Spurring in the thoracic spine.  IMPRESSION: 1. Cardiomegaly with asymmetric edema/infiltrates, right worse than left.   Electronically Signed   By: Arne Cleveland M.D.   On: 05/16/2014 11:12   Chest 2 View  05/04/2014   CLINICAL DATA:  Preoperative radiograph.  Vulvar cancer.  EXAM: CHEST  2 VIEW  COMPARISON:  NM PET IMAGE RESTAG (PS) SKULL BASE TO THIGH dated 10/07/2013; DG CHEST  2 VIEW dated 02/23/2012  FINDINGS: Dense mitral annular calcification. Bilateral pleural apical scarring. No airspace disease or effusion. Chronic bronchitic changes. Configuration of the hila appears similar to the prior exam. Aortic arch atherosclerosis.  IMPRESSION: No acute cardiopulmonary disease.  Chronic changes of the chest.   Electronically Signed   By: Dereck Ligas M.D.   On: 05/04/2014 16:14   Dg Abd 1 View  05/16/2014   CLINICAL DATA:  Postop APR; nausea, vomiting  EXAM: ABDOMEN - 1 VIEW  COMPARISON:  PET-CT 10/07/2013  FINDINGS: Surgical staples project over the right lower quadrant. Surgical drain in the pelvis. A few gas distended mid abdominal small bowel loops. Normal distribution comparisons will throughout colon. Cortical loss at the superior margin of the right pubic bone.   IMPRESSION: Nonobstructive bowel gas pattern.   Electronically Signed   By: Arne Cleveland M.D.   On: 05/16/2014 11:14   Dg Chest Port 1 View  05/16/2014   CLINICAL DATA:  PICC placement  EXAM: PORTABLE CHEST - 1 VIEW  COMPARISON:  Earlier film of the same day  FINDINGS: Left arm PICC line extends to the cavoatrial junction. Stable cardiomegaly. Some worsening of bilateral edema/ infiltrates, right greater than left. . No effusion. Surgical clips above the thoracic inlet. Regional bones unremarkable.  IMPRESSION: 1. PICC line to cavoatrial junction. 2. Interval worsening of bilateral edema/infiltrates, right worse than left.   Electronically Signed   By: Arne Cleveland M.D.   On: 05/16/2014 13:31      ASSESSMENT: AFib, high CHADs score; GI bleed-unclear source- awaiting pathology from biopsy.  Hbg stable today  PLAN:  Anticoagulation recommended.  GYN preferred Lovenox over Coumadin due to metabolism issues.  If using Lovenox, will need to follow renal function closely.  Cr Clearance 36 today.    AFib-rate controlled.  No evidence of CHF.  Lindsay Booze, MD  05/24/2014  9:10 AM

## 2014-05-24 NOTE — Progress Notes (Signed)
05/24/14 1920  Vaginal dressing changed and packed with curlex.

## 2014-05-24 NOTE — Progress Notes (Signed)
Patient ID: [name], female   DOB: 16-Nov-1926, 80 y.o.   MRN: [id number] 4 Days Post-Op Procedure(s) (LRB): ESOPHAGOGASTRODUODENOSCOPY (EGD) (N/A)  Subjective: Patient w/o new complaints  Objective: Vital signs in last 24 hours: Temp:  [97.3 F (36.3 C)-98.2 F (36.8 C)] 98.2 F (36.8 C) (05/31 0558) Pulse Rate:  [69-115] 115 (05/31 0854) Resp:  [18-20] 20 (05/31 0854) BP: (107-154)/(55-95) 128/80 mmHg (05/31 0854) SpO2:  [96 %-100 %] 100 % (05/31 0854) Weight:  [68.1 kg (150 lb 2.1 oz)] 68.1 kg (150 lb 2.1 oz) (05/31 0558) Last BM Date: 05/24/14  Intake/Output from previous day: 05/30 0701 - 05/31 0700 In: 140 [I.V.:140] Out: 1890 [Urine:1475; Drains:415]  CBG (last 3)   Recent Labs  05/23/14 1631 05/23/14 2125 05/24/14 0748  GLUCAP 152* 173* 145*    Physical Examination: General: alert Resp: good air movement Cardio: irregular rhythm GI: soft, non-tender; bowel sounds normal;  incision: clean, dry and intact; stoma pink Extremities: extremities normal, atraumatic, no cyanosis or edema Pelvic: dressing removed/replaced; wound w/granulation tissue  Labs: WBC/Hgb/Hct/Plts:  8.2/9.9/32.1/211 (05/31 6834) BUN/Cr/glu/ALT/AST/amyl/lip:  16/1.32/--/--/--/--/-- (05/31 0514)  CBG (last 3)   Recent Labs  05/23/14 1631 05/23/14 2125 05/24/14 0748  GLUCAP 152* 173* 145*     Assessment:  80 y.o. s/p Procedure(s): RADICAL VULVECTOMY WITH COLOSTOMY EXPLORATORY LAPAROTOMY, TOTAL ABDO MINAL HYSTERECTOMY AND BILATERAL SALPINGOOPHERECTOMY: stable Pain:  Pain is controlled on oral pain medicaions  Heme: Anemia: hemoglobin stable  CV: Arrhythmia: chronic atrial fibrilation Hypertension: B/Ps in the low normal range. Current treatment:  metoprolol (Lopressor, Toprol)/diltiazem.  GI:  Peptic ulcer disease--symptoms controlled  Pulm:  H/o chronic SOB; pulmonary status stable  Renal: Renal function stable  Endo: Diabetes mellitus Type II, under adequate  control.[name]     Prophylaxis: SCDs/full anti-coagulation on LMWH  GU: Urinary retention in the setting of recent radical pelvic surgical procedure --foley catheter in place   Plan:  Follow anti factor Xa levels/renal function Catheter care Encourage ambulation Continue PPI Continue dressing changes BID      LOS: 12 days    [name] 05/24/2014, 11:28 AM

## 2014-05-24 NOTE — Progress Notes (Addendum)
[facility] Medical Consultation - Progress Note  [name] QBH:[id number] DOB: 1926/05/27 DOA: 05/12/2014 PCP: Elayne Snare, MD   Requesting physician: Dr. 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 GI bleed - Dr. Watt Climes following. Hemoglobin stable. Status post EGD 5/27-moderate hiatal hernia with distal esophageal ulcers and probable Barrett's with GE junction probable ulcerative mass- pathology results as below- no malignancy. Discussed with Dr. Watt Climes 5/29- Continue twice a day PPI, OK to start Coumadin and will need repeat EGD to look at GE junction in 2 months- discussed with patient who verbalized understanding.  2. IDDM - uncontrolled. Increase Lantus from 5>10 units QHS. Continue SSI. Improved control. Would leave on current regimen. 3. Chronic anticoagulation - was held #1. She has had CVAs and is currently in A fib. Primary service has started on full dose Lovenox. As per Cardiology notes and discussion with Dr. Irish Lack on 5/29- recommends no Lovenox bridging and start Coumadin and let INR gradually drift up. May use DVT prophylactic dose Lovenox until INR > 1.8. Discussed with Dr. [name] re same.  Continues on LMWH. Defer anticoagulation Mx to primary service: preferring to use Lovenox. 4. A fib - She is on Diltiazem 180 mg daily and on Metoprolol 50 mg BID. 2D echo done 5/23 with normal EF, mild AS. Controlled ventricular rate. High risk for cardio embolic events-Anticoagulation discussion as above. 5. Aortic  stenosis - last 2D echo 55-60% with moderate AS (03/2012). No significant change now.  6. Prior CVA with residual left sided weakness 7. S/p radical vulvectomy with colostomy, total abdominal hysterectomy and bilateral salpingoopherectomy for recurrent vulvar cancer - per primary team. Empiric Zosyn.  8. Acute on stage III chronic kidney disease:? Secondary to intravascular volume depletion versus urinary retention. Foley placement. Encourage by mouth fluids. Creatinine probably at baseline. 9. Hypokalemia: Replaced. Mg 1.5 10. Anemia: Stable. 11. Thrombocytopenia: Stable 12. Hypothyroid: Continue Synthroid  I will followup again tomorrow. Please contact me if I can be of assistance in the meanwhile. Thank you for this consultation. Plans for rehab transfer ? 05/25/14  Subjective: Denies complaints. Ongoing intermittent abdominal pain-refuses stronger medications.  Physical Exam: Blood pressure 128/80, pulse 115, temperature 98.2 F (36.8 C), temperature source Oral, resp. rate 20, height 5\' 4"  (1.626 m), weight 68.1 kg (150 lb 2.1 oz), SpO2 100.00%. Filed Vitals:   05/24/14 0854  BP: 128/80  Pulse: 115  Temp:   Resp: 20    General:  Elderly female lying in bed and was in no obvious distress.  Cardiovascular: irregular, 3/6 SEM. Telemetry: Atrial fibrillation with controlled ventricular rate.  Respiratory: clear, moves air well, no wheezing. No increased work of breathing.  Abdomen: mildly tender. Laparotomy site intact. Colostomy +.  Skin: no rashes  Musculoskeletal: no edema  Neurologic: moves all 4, mild left sided residual weakness; follows commands well  Labs on Admission:  Basic Metabolic Panel:  Recent Labs Lab 05/18/14 0535 05/19/14 0535 05/20/14 0605 05/21/14 1830 05/22/14 0440 05/24/14 0514  NA 143 141 140 140 139 139  K 3.2* 3.5* 3.4* 4.2  4.6 4.3  CL 104 102 101 102 102 102  CO2 29 28 28 27 26 27   GLUCOSE 58* 160* 126* 207* 221* 145*  BUN 29* 26* 24* 18  16 16   CREATININE 1.26* 1.45* 1.50* 1.33* 1.28* 1.32*  CALCIUM 8.0* 8.0* 7.9* 8.0* 8.1* 8.5  MG 1.3*  --   --  1.5  --   --   PHOS 2.7  --   --   --   --   --    Liver Function Tests: No results found for this basename: AST, ALT, ALKPHOS, BILITOT, PROT, ALBUMIN,  in the last 168 hours CBC:  Recent Labs Lab 05/18/14 2155 05/19/14 0535 05/22/14 0440 05/23/14 0423 05/24/14 0514  WBC 10.2 9.3 8.2 9.4 8.2  HGB 9.6* 9.6* 10.0* 10.2* 9.9*  HCT 28.9* 28.9* 31.5* 31.4* 32.1*  MCV 91.7 92.6 96.9 96.3 97.0  PLT 141* 137* 168 201 211   CBG:  Recent Labs Lab 05/23/14 0750 05/23/14 1203 05/23/14 1631 05/23/14 2125 05/24/14 0748  GLUCAP 139* 190* 152* 173* 145*    Radiological Exams on Admission: No results found.  [name] acetaminophen  1,000 mg Oral Q6H  . antiseptic oral rinse  15 mL Mouth Rinse q12n4p  . atorvastatin  20 mg Oral Daily  . chlorhexidine  15 mL Mouth Rinse BID  . diltiazem  120 mg Oral Daily  . enoxaparin (LOVENOX) injection  1 mg/kg Subcutaneous Q12H  . feeding supplement (GLUCERNA SHAKE)  237 mL Oral TID WC  . furosemide  20 mg Oral Daily  . insulin aspart  0-5 Units Subcutaneous QHS  . insulin aspart  0-9 Units Subcutaneous TID WC  . insulin glargine  10 Units Subcutaneous QHS  . levothyroxine  100 mcg Oral Once per day on Mon Tue Wed Thu Fri Sat  . magnesium oxide  200 mg Oral Daily  . metoprolol  50 mg Oral BID  . pantoprazole  40 mg Oral BID AC  . potassium chloride  10 mEq Oral Daily  . traMADol  50 mg Oral 4 times per day     Time spent: [place], MD, FACP, [facility]. [facility] Pager [phone]  If 7PM-7AM, please contact night-coverage [email or web address] Password [email or web address] 05/24/2014, 11:20 AM  05/24/2014, 11:20 AM

## 2014-05-25 ENCOUNTER — Other Ambulatory Visit: Payer: Self-pay

## 2014-05-25 ENCOUNTER — Other Ambulatory Visit: Payer: Self-pay | Admitting: *Deleted

## 2014-05-25 ENCOUNTER — Ambulatory Visit: Payer: Self-pay | Admitting: Endocrinology

## 2014-05-25 DIAGNOSIS — I359 Nonrheumatic aortic valve disorder, unspecified: Secondary | ICD-10-CM

## 2014-05-25 DIAGNOSIS — I10 Essential (primary) hypertension: Secondary | ICD-10-CM

## 2014-05-25 DIAGNOSIS — I350 Nonrheumatic aortic (valve) stenosis: Secondary | ICD-10-CM

## 2014-05-25 LAB — CBC
HCT: 31.8 % — ABNORMAL LOW (ref 36.0–46.0)
Hemoglobin: 9.8 g/dL — ABNORMAL LOW (ref 12.0–15.0)
MCH: 30.1 pg (ref 26.0–34.0)
MCHC: 30.8 g/dL (ref 30.0–36.0)
MCV: 97.5 fL (ref 78.0–100.0)
Platelets: 227 K/uL (ref 150–400)
RBC: 3.26 MIL/uL — ABNORMAL LOW (ref 3.87–5.11)
RDW: 18.9 % — ABNORMAL HIGH (ref 11.5–15.5)
WBC: 8 K/uL (ref 4.0–10.5)

## 2014-05-25 LAB — GLUCOSE, CAPILLARY
Glucose-Capillary: 220 mg/dL — ABNORMAL HIGH (ref 70–99)
Glucose-Capillary: 125 mg/dL — ABNORMAL HIGH (ref 70–99)

## 2014-05-25 LAB — HEPARIN ANTI-XA: Heparin LMW: 1.09 IU/mL

## 2014-05-25 MED ORDER — OXYCODONE-ACETAMINOPHEN 5-325 MG PO TABS: 1.0000 | ORAL_TABLET | Freq: Four times a day (QID) | ORAL | Status: DC | PRN

## 2014-05-25 MED ORDER — ENOXAPARIN SODIUM 80 MG/0.8ML ~~LOC~~ SOLN: 0.9000 mg/kg | Freq: Two times a day (BID) | SUBCUTANEOUS | Status: DC

## 2014-05-25 MED ORDER — ATORVASTATIN CALCIUM 20 MG PO TABS
20.0000 mg | ORAL_TABLET | Freq: Every day | ORAL | Status: DC
Start: 2014-05-25 — End: 2014-09-07

## 2014-05-25 MED ORDER — PANTOPRAZOLE SODIUM 40 MG PO TBEC: 40.0000 mg | DELAYED_RELEASE_TABLET | Freq: Two times a day (BID) | ORAL | Status: DC

## 2014-05-25 MED ORDER — ONDANSETRON HCL 4 MG/2ML IJ SOLN: 4.0000 mg | Freq: Four times a day (QID) | INTRAMUSCULAR | Status: DC | PRN

## 2014-05-25 NOTE — Progress Notes (Signed)
Per MD order, PICC line removed. Cath intact at 42cm. Vaseline pressure gauze to site, pressure held x 39min. No bleeding to site. Pt instructed to keep dressing CDI x 24 hours.   If bleeding occurs hold pressure,and contact staff nurse. Pt does not have any questions. [name]

## 2014-05-25 NOTE — Progress Notes (Signed)
[facility] Medical Consultation - Progress Note  [name] ZJQ:[id number] DOB: [birth year] DOA: 05/12/2014 PCP: Elayne Snare, MD   Requesting physician: Dr. Fermin Schwab Date of consultation: 05/16/14 Reason for consultation: multiple medical problems  Impression/Recommendations Active Problems:   Vulvar cancer, carcinoma   Hyperlipidemia   Hypothyroidism   Leukocytosis   CVA (cerebral infarction)   Type II or unspecified type diabetes mellitus with renal manifestations, uncontrolled(250.42)   Anemia   Obesity   Atrial fibrillation   Chronic kidney disease, stage III (moderate)   Vulva cancer   Chronic anticoagulation   GI bleed   Aortic stenosis   1. GI bleed - Dr. Watt Climes following. Hemoglobin stable. Status post EGD 5/27-moderate hiatal hernia with distal esophageal ulcers and probable Barrett's with GE junction probable ulcerative mass- pathology results as below- no malignancy. Discussed with Dr. Watt Climes 5/29- Continue twice a day PPI, OK to start Coumadin and will need repeat EGD to look at GE junction in 2 months- discussed with patient who verbalized understanding.  2. IDDM - uncontrolled. Increase Lantus from 5>10 units QHS. Continue SSI. Improved control. Would leave on current regimen and adjust as needed. 3. Chronic anticoagulation - was held #1. She has had CVAs and is currently in A fib. Primary service has started on full dose Lovenox. As per Cardiology notes and discussion with Dr. Irish Lack on 5/29- recommends no Lovenox bridging and start Coumadin and let INR gradually drift up. May use DVT prophylactic dose Lovenox until INR > 1.8. Discussed with Dr. [name] re same.  Continues on LMWH. Defer anticoagulation Mx to primary service: preferring to use Lovenox. 4. A fib - She is on Diltiazem 180 mg daily and on Metoprolol 50 mg BID. 2D echo done 5/23 with normal EF, mild AS. Controlled ventricular rate. High risk for cardio embolic  events-Anticoagulation discussion as above. 5. Aortic stenosis - last 2D echo 55-60% with moderate AS (03/2012). No significant change now.  6. Prior CVA with residual left sided weakness 7. S/p radical vulvectomy with colostomy, total abdominal hysterectomy and bilateral salpingoopherectomy for recurrent vulvar cancer - per primary team. Empiric Zosyn.  8. Acute on stage III chronic kidney disease:? Secondary to intravascular volume depletion versus urinary retention. Foley placement. Encourage by mouth fluids. Creatinine probably at baseline. 9. Hypokalemia: Replaced. Mg 1.5 10. Anemia: Stable. 11. Thrombocytopenia: Stable 12. Hypothyroid: Continue Synthroid  Seen patient prior to DC to rehab today. Discussed with Dr. [name]. Discussed with spouse at beside.  Subjective: Intermittent abdominal pain- controlled with medications.  Physical Exam: Blood pressure 125/63, pulse 79, temperature 97.9 F (36.6 C), temperature source Oral, resp. rate 18, height 5\' 4"  (1.626 m), weight 68.5 kg (151 lb 0.2 oz), SpO2 96.00%. Filed Vitals:   05/25/14 0516  BP: 125/63  Pulse: 79  Temp: 97.9 F (36.6 C)  Resp: 18    General:  Elderly female lying in bed and was in no obvious distress.  Cardiovascular: irregular, 3/6 SEM.   Respiratory: clear, moves air well, no wheezing. No increased work of breathing.  Abdomen: mildly tender. Laparotomy site intact. Colostomy +.  Skin: no rashes  Musculoskeletal: no edema  Neurologic: moves all 4, mild left sided residual weakness; follows commands well  Labs on Admission:  Basic Metabolic Panel:  Recent Labs Lab 05/19/14 0535 05/20/14 0605 05/21/14 1830 05/22/14 0440 05/24/14 0514  NA 141 140 140 139 139  K 3.5* 3.4* 4.2 4.6 4.3  CL 102 101 102 102 102  CO2 28 28 27 26 27   GLUCOSE 160* 126* 207* 221* 145*  BUN 26* 24* 18 16 16   CREATININE 1.45* 1.50* 1.33* 1.28* 1.32*  CALCIUM 8.0* 7.9* 8.0* 8.1* 8.5  MG  --   --  1.5  --    --    Liver Function Tests: No results found for this basename: AST, ALT, ALKPHOS, BILITOT, PROT, ALBUMIN,  in the last 168 hours CBC:  Recent Labs Lab 05/19/14 0535 05/22/14 0440 05/23/14 0423 05/24/14 0514 05/25/14 0354  WBC 9.3 8.2 9.4 8.2 8.0  HGB 9.6* 10.0* 10.2* 9.9* 9.8*  HCT 28.9* 31.5* 31.4* 32.1* 31.8*  MCV 92.6 96.9 96.3 97.0 97.5  PLT 137* 168 201 211 227   CBG:  Recent Labs Lab 05/24/14 0748 05/24/14 1216 05/24/14 1703 05/24/14 2124 05/25/14 0729  GLUCAP 145* 228* 184* 220* 125*    Radiological Exams on Admission: No results found.  [name] acetaminophen  1,000 mg Oral Q6H  . antiseptic oral rinse  15 mL Mouth Rinse q12n4p  . atorvastatin  20 mg Oral Daily  . chlorhexidine  15 mL Mouth Rinse BID  . diltiazem  120 mg Oral Daily  . enoxaparin (LOVENOX) injection  1 mg/kg Subcutaneous Q12H  . feeding supplement (GLUCERNA SHAKE)  237 mL Oral TID WC  . furosemide  20 mg Oral Daily  . insulin aspart  0-5 Units Subcutaneous QHS  . insulin aspart  0-9 Units Subcutaneous TID WC  . insulin glargine  10 Units Subcutaneous QHS  . levothyroxine  100 mcg Oral Once per day on Mon Tue Wed Thu Fri Sat  . magnesium oxide  200 mg Oral Daily  . metoprolol  50 mg Oral BID  . pantoprazole  40 mg Oral BID AC  . potassium chloride  10 mEq Oral Daily  . traMADol  50 mg Oral 4 times per day     Time spent: [place], MD, FACP, [facility]. [facility] Pager [phone]  If 7PM-7AM, please contact night-coverage [email or web address] Password [email or web address] 05/25/2014, 11:49 AM  05/25/2014, 11:49 AM

## 2014-05-25 NOTE — Consult Note (Signed)
WOC ostomy follow up Stoma type/location: LLQ Colostomy Stomal assessment/size: 1 and 1/4 inches round Peristomal assessment: New ulceration today at 6 o'clock:  0.6cm x1.4cm x 0.4cm Treatment options for stomal/peristomal skin: Cleansed and then covered with skin barrier ring Output Soft brown stool Ostomy pouching: 2pc. 2 and 1/4 inch pouching system with skin barrier ring Education provided: Husband and patient prepared for discharge Enrolled patient in [place] Start Discharge program: Yes Ready for discharge to Rehab facility ([facility]) and will have [facility] following that for intermittent care until independent in ostomy care. Thanks, Maudie Flakes, MSN, RN, [place], [place], [place] [phone])

## 2014-05-25 NOTE — Progress Notes (Signed)
SUBJECTIVE:  No complaints  OBJECTIVE:   Vitals:   Filed Vitals:   05/24/14 0854 05/24/14 1459 05/24/14 2126 05/25/14 0516  BP: 128/80 118/48 132/80 125/63  Pulse: 115 75 110 79  Temp:  98 F (36.7 C) 98.3 F (36.8 C) 97.9 F (36.6 C)  TempSrc:  Oral Oral Oral  Resp: 20 18 18 18   Height:      Weight:    151 lb 0.2 oz (68.5 kg)  SpO2: 100% 95% 97% 96%   I&O's:   Intake/Output Summary (Last 24 hours) at 05/25/14 5400 Last data filed at 05/25/14 0700  Gross per 24 hour  Intake    740 ml  Output   1685 ml  Net   -945 ml   TELEMETRY: Reviewed telemetry pt in atrial fibrillation with CVR     PHYSICAL EXAM General: Well developed, well nourished, in no acute distress Head: Eyes PERRLA, No xanthomas.   Normal cephalic and atramatic  Lungs:   Clear bilaterally to auscultation and percussion. Heart:   Irregularly irregular S1 S2 Pulses are 2+ & equal. Abdomen: Bowel sounds are positive, abdomen soft and non-tender without masses  Extremities:   No clubbing, cyanosis or edema.  DP +1 Neuro: Alert and oriented X 3. Psych:  Good affect, responds appropriately   LABS: Basic Metabolic Panel:  Recent Labs  05/24/14 0514  NA 139  K 4.3  CL 102  CO2 27  GLUCOSE 145*  BUN 16  CREATININE 1.32*  CALCIUM 8.5   Liver Function Tests: No results found for this basename: AST, ALT, ALKPHOS, BILITOT, PROT, ALBUMIN,  in the last 72 hours No results found for this basename: LIPASE, AMYLASE,  in the last 72 hours CBC:  Recent Labs  05/24/14 0514 05/25/14 0354  WBC 8.2 8.0  HGB 9.9* 9.8*  HCT 32.1* 31.8*  MCV 97.0 97.5  PLT 211 227   Cardiac Enzymes: No results found for this basename: CKTOTAL, CKMB, CKMBINDEX, TROPONINI,  in the last 72 hours BNP: No components found with this basename: POCBNP,  D-Dimer: No results found for this basename: DDIMER,  in the last 72 hours Hemoglobin A1C: No results found for this basename: HGBA1C,  in the last 72 hours Fasting Lipid  Panel: No results found for this basename: CHOL, HDL, LDLCALC, TRIG, CHOLHDL, LDLDIRECT,  in the last 72 hours Thyroid Function Tests: No results found for this basename: TSH, T4TOTAL, FREET3, T3FREE, THYROIDAB,  in the last 72 hours Anemia Panel: No results found for this basename: VITAMINB12, FOLATE, FERRITIN, TIBC, IRON, RETICCTPCT,  in the last 72 hours Coag Panel:   Lab Results  Component Value Date   INR 1.41 05/18/2014   INR 6.52* 05/17/2014   INR 2.58* 05/16/2014    RADIOLOGY: Dg Chest 1 View  05/16/2014   CLINICAL DATA:  Shortness of breath, vomiting  EXAM: CHEST - 1 VIEW  COMPARISON:  05/04/2014  FINDINGS: Mild cardiomegaly. Mild interstitial edema/ infiltrates predominant perihilar, right worse than left. . No definite effusion.  Atheromatous aorta. Spurring in the thoracic spine.  IMPRESSION: 1. Cardiomegaly with asymmetric edema/infiltrates, right worse than left.   Electronically Signed   By: Arne Cleveland M.D.   On: 05/16/2014 11:12   Chest 2 View  05/04/2014   CLINICAL DATA:  Preoperative radiograph.  Vulvar cancer.  EXAM: CHEST  2 VIEW  COMPARISON:  NM PET IMAGE RESTAG (PS) SKULL BASE TO THIGH dated 10/07/2013; DG CHEST 2 VIEW dated 02/23/2012  FINDINGS: Dense mitral annular calcification.  Bilateral pleural apical scarring. No airspace disease or effusion. Chronic bronchitic changes. Configuration of the hila appears similar to the prior exam. Aortic arch atherosclerosis.  IMPRESSION: No acute cardiopulmonary disease.  Chronic changes of the chest.   Electronically Signed   By: Dereck Ligas M.D.   On: 05/04/2014 16:14   Dg Abd 1 View  05/16/2014   CLINICAL DATA:  Postop APR; nausea, vomiting  EXAM: ABDOMEN - 1 VIEW  COMPARISON:  PET-CT 10/07/2013  FINDINGS: Surgical staples project over the right lower quadrant. Surgical drain in the pelvis. A few gas distended mid abdominal small bowel loops. Normal distribution comparisons will throughout colon. Cortical loss at the superior  margin of the right pubic bone.  IMPRESSION: Nonobstructive bowel gas pattern.   Electronically Signed   By: Arne Cleveland M.D.   On: 05/16/2014 11:14   Dg Chest Port 1 View  05/16/2014   CLINICAL DATA:  PICC placement  EXAM: PORTABLE CHEST - 1 VIEW  COMPARISON:  Earlier film of the same day  FINDINGS: Left arm PICC line extends to the cavoatrial junction. Stable cardiomegaly. Some worsening of bilateral edema/ infiltrates, right greater than left. . No effusion. Surgical clips above the thoracic inlet. Regional bones unremarkable.  IMPRESSION: 1. PICC line to cavoatrial junction. 2. Interval worsening of bilateral edema/infiltrates, right worse than left.   Electronically Signed   By: Arne Cleveland M.D.   On: 05/16/2014 13:31    ASSESSMENT: 1.  AFib, high CHADs score.  AFib-rate controlled. No evidence of CHF 2.  GI bleed-unclear source- . Hbg stable today .  EGD with hiatal hernia and distal esohpageal ulcers and prbable Barrett's esophagus with ulcerative mass as Ge junction - no malignancy by biopsy.  3.  Moderate AS 4.  HTN controlled  PLAN:  GI ok'd starting coumadin but GYN prefers to use Lovenox No new recs - will sign off - call with any questions  Sueanne Margarita, MD  05/25/2014  8:11 AM

## 2014-05-25 NOTE — Progress Notes (Signed)
Report given to RN at [facility]. All questions answered. Callie Fielding RN

## 2014-05-25 NOTE — Discharge Summary (Signed)
Physician Discharge Summary  Patient ID: [name] MRN: [id number] DOB/AGE: [birth year] 78 y.o.  Admit date: 05/12/2014 Discharge date: 05/25/2014  Admission Diagnoses:Active Problems:   Vulvar cancer, carcinoma   Hyperlipidemia   Hypothyroidism   Leukocytosis   CVA (cerebral infarction)   Type II or unspecified type diabetes mellitus with renal manifestations, uncontrolled(250.42)   Anemia   Obesity   Atrial fibrillation   Chronic kidney disease, stage III (moderate)   Vulva cancer   Chronic anticoagulation   GI bleed   Aortic stenosis   Discharge Diagnoses:  Active Problems:   Vulvar cancer, carcinoma   Hyperlipidemia   Hypothyroidism   Leukocytosis   CVA (cerebral infarction)   Type II or unspecified type diabetes mellitus with renal manifestations, uncontrolled(250.42)   Anemia   Obesity   Atrial fibrillation   Chronic kidney disease, stage III (moderate)   Vulva cancer   Chronic anticoagulation   GI bleed   Aortic stenosis   Discharged Condition: stable  Hospital Course: On 05/12/2014 - 05/20/2014, the patient underwent the following: Procedure(s): ESOPHAGOGASTRODUODENOSCOPY (EGD).  RADICAL VULVECTOMY WITH COLOSTOMY (N/A)  EXPLORATORY LAPAROTOMY, TOTAL ABDO MINAL HYSTERECTOMY AND BILATERAL SALPINGOOPHERECTOMY (N/A)  The patient became hypotensive after the initial procedure and was transferred to the ICU for closer monitoring.  She subsequently was transfused 2 units of PRBC.  An attempt was made to bridge the patient to coumadin.  She became overanticoagulated with the coumadin and suffered an upper gastrointestinal bleed.  At this point the hospitalist service as well as gastroenterology were consulted.  The coagulopathy was corrected and she was transfused an addition 2 units of PRBC.  She underwent an upper GI endoscopy and was diagnosed with peptic ulcer disease.   Biopsies were taken and the pathology was benign.  A repeat endoscopy is planned in 2 months.   Cardiology, Physical Therapy and the Ostomy Team were consulted.  A repeat cardiac echocardiogram was stable.  The patient's diet was advanced.  She developed urinary retention and the foley catheter was replaced. The plan is to continue full anticoagulation with lovenox at this point and to hold the Coumadin.  She is to be to transferred to a rehabilitation facility.  She will need twice per day dressing changes and catheter care.  Consults: see above  Significant Diagnostic Studies: see above, AXR  Treatments: See above  Discharge Exam: Blood pressure 125/63, pulse 79, temperature 97.9 F (36.6 C), temperature source Oral, resp. rate 18, height 5\' 4"  (1.626 m), weight 68.5 kg (151 lb 0.2 oz), SpO2 96.00%. General appearance: alert Resp: clear to auscultation bilaterally Cardio: regular rate and rhythm, S1, S2 normal, no murmur, click, rub or gallop GI: soft, non-tender; bowel sounds normal; no masses,  no organomegaly Pelvic: granulation tissue present Extremities: extremities normal, atraumatic, no cyanosis or edema and Homans sign is negative, no sign of DVT  Disposition: Rehabilitation facility    Medication List    STOP taking these medications       warfarin 5 MG tablet  Commonly known as:  COUMADIN      TAKE these medications       atorvastatin 20 MG tablet  Commonly known as:  LIPITOR  Take 20 mg by mouth daily.     calcitRIOL 0.25 MCG capsule  Commonly known as:  ROCALTROL  Take 0.25 mcg by mouth every Monday, Wednesday, and Friday.     diltiazem 180 MG 24 hr capsule  Commonly known as:  DILACOR XR  Take 180 mg  by mouth every morning.     enoxaparin 80 MG/0.8ML injection  Commonly known as:  LOVENOX  Inject 0.6 mLs (60 mg total) into the skin every 12 (twelve) hours.     furosemide 20 MG tablet  Commonly known as:  LASIX  Take 20 mg by mouth every morning.     insulin detemir 100 UNIT/ML injection  Commonly known as:  LEVEMIR  Inject 16-22 Units into  the skin 2 (two) times daily. 22 in am and 16  in pm     insulin lispro 100 UNIT/ML injection  Commonly known as:  HUMALOG  Inject 14-22 Units into the skin 3 (three) times daily before meals. 14 in the morning 14 at lunch and 22 at supper     levothyroxine 100 MCG tablet  Commonly known as:  SYNTHROID, LEVOTHROID  Take 100 mcg by mouth as directed. Every day except sunday     metoprolol 50 MG tablet  Commonly known as:  LOPRESSOR  Take 50 mg by mouth 2 (two) times daily.     ondansetron 4 MG/2ML Soln injection  Commonly known as:  ZOFRAN  Inject 2 mLs (4 mg total) into the vein every 6 (six) hours as needed for nausea.     oxyCODONE-acetaminophen 5-325 MG per tablet  Commonly known as:  PERCOCET  Take 1-2 tablets by mouth every 6 (six) hours as needed for severe pain.     pantoprazole 40 MG tablet  Commonly known as:  PROTONIX  Take 1 tablet (40 mg total) by mouth 2 (two) times daily before a meal.     potassium chloride 10 MEQ tablet  Commonly known as:  K-DUR,KLOR-CON  Take 10 mEq by mouth daily.           Follow-up Information   Follow up with Alvino Chapel, MD On 05/29/2014.   Specialty:  Gynecology   Contact information:   [address] [place] [id number] [phone]       Signed: [name] 05/25/2014, 8:56 AM

## 2014-05-25 NOTE — Progress Notes (Signed)
Patient is set to discharge to [facility] today. Patient & husband at bedside aware. Discharge packet given to RN, Robert Bellow. [facility] scheduled for 11am pickup (Service Request Id: [id number]).   Clinical Social Work Department CLINICAL SOCIAL WORK PLACEMENT NOTE 05/25/2014  Patient:  [name], [name]  Account Number:  [id number] Admit date:  05/12/2014  Clinical Social Worker:  Werner Lean, LCSW  Date/time:  05/13/2014 03:44 PM  Clinical Social Work is seeking post-discharge placement for this patient at the following level of care:   SKILLED NURSING   (*CSW will update this form in Epic as items are completed)     Patient/family provided with [facility] Department of Clinical Social Work's list of facilities offering this level of care within the geographic area requested by the patient (or if unable, by the patient's family).  05/13/2014  Patient/family informed of their freedom to choose among providers that offer the needed level of care, that participate in Medicare, Medicaid or managed care program needed by the patient, have an available bed and are willing to accept the patient.    Patient/family informed of [facility]' ownership interest in [facility], as well as of the fact that they are under no obligation to receive care at this facility.  PASARR submitted to EDS on 05/13/2014 PASARR number received from EDS on 05/13/2014  FL2 transmitted to all facilities in geographic area requested by pt/family on  05/13/2014 FL2 transmitted to all facilities within larger geographic area on   Patient informed that his/her managed care company has contracts with or will negotiate with  certain facilities, including the following:     Patient/family informed of bed offers received:  05/25/2014 Patient chooses bed at [facility] Physician recommends and patient chooses bed at    Patient to be transferred to [facility] on  05/25/2014 Patient to be transferred to facility by  [facility]  The following physician request were entered in Epic:   Additional Comments:   Raynaldo Opitz, [place] Worker cell #: [phone]

## 2014-05-26 ENCOUNTER — Non-Acute Institutional Stay (SKILLED_NURSING_FACILITY): Payer: Medicare Other | Admitting: Internal Medicine

## 2014-05-26 DIAGNOSIS — K279 Peptic ulcer, site unspecified, unspecified as acute or chronic, without hemorrhage or perforation: Secondary | ICD-10-CM

## 2014-05-26 DIAGNOSIS — D62 Acute posthemorrhagic anemia: Secondary | ICD-10-CM | POA: Insufficient documentation

## 2014-05-26 DIAGNOSIS — C519 Malignant neoplasm of vulva, unspecified: Secondary | ICD-10-CM

## 2014-05-26 DIAGNOSIS — N183 Chronic kidney disease, stage 3 unspecified: Secondary | ICD-10-CM

## 2014-05-26 HISTORY — DX: Peptic ulcer, site unspecified, unspecified as acute or chronic, without hemorrhage or perforation: K27.9

## 2014-05-26 NOTE — Progress Notes (Signed)
HISTORY & PHYSICAL  DATE: 05/26/2014   FACILITY: [facility]  LEVEL OF CARE: SNF (31)  ALLERGIES:  Allergies  Allergen Reactions  . Tenex [Guanfacine Hcl] Other (See Comments)    hypotension  . Crestor [Rosuvastatin] Other (See Comments)    Myalgia    CHIEF COMPLAINT:  Manage vulva cancer, peptic ulcer disease and acute blood loss anemia  HISTORY OF PRESENT ILLNESS: Patient is an 79 year old Caucasian female who is admitted to this facility for short-term rehabilitation after a recent hospitalization.  VULVA CANCER: The patient underwent radical vulvectomy with colostomy, exploratory laparotomy, total abdominal hysterectomy and bilateral salpingo-oophorectomy and tolerated the procedure well. She denies pain currently.  ANEMIA: The anemia has been stable. The patient denies fatigue, melena or hematochezia. No complications from the medications currently being used. Patient required 4 units of packed red blood cell transfusion.  PUD: While in the hospital patient suffered an upper GI bleed. EGD showed peptic ulcer disease. Biopsies were benign. Patient denies further GI bleeding, nausea or vomiting.  PAST MEDICAL HISTORY :  Past Medical History  Diagnosis Date  . Hypertension   . Hyperlipidemia   . Obesity   . Hypothyroidism   . Vulvar cancer, carcinoma 11/03/2011  . Atrial fibrillation   . Cancer 2007    vulva-invasive well diff squamous cell ca  . CVA (cerebral infarction)   . Dyslipidemia   . History of radiation therapy 10/20/2013-12/01/2013    60 gray to perineum area and lower pelvis  . Diabetes mellitus 05-04-14    "Diabetic coma" 1 week ago -tx. by EMT At residence until pt. feeling improved and stable.  . Shortness of breath     occ. with coughing up phelgm usually mornings  . 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 Abnormal voice 05-04-14    "raspy voice" with inspiratory effort-Dr. Lissa Hoard aware 05-04-14  . Stroke  05/2000    right brain CVA pre H&P 2001-    PAST SURGICAL HISTORY: Past Surgical History  Procedure Laterality Date  . Vulvar lesion removal  2012  . Breast surgery      Lumpectomy  . Dilation and curettage of uterus    . Cystourethroplasty / ureteroneocystostomy    . Radical vulvectomy    . Lymphadenectomy  2007    R inguinal femoral  . Radical vulvectomy  02/27/12  . Vulvar lesion removal  02/27/2012    Procedure: VULVAR LESION;  Surgeon: Alvino Chapel, MD;  Location: [facility];  Service: Gynecology;  Laterality: N/A;  . Right carotid endarterctomy    . Left eye surgery      , permanently stitched eye together  . Vulvectomy N/A 05/12/2014    Procedure: RADICAL VULVECTOMY WITH COLOSTOMY;  Surgeon: Alvino Chapel, MD;  Location: [facility];  Service: Gynecology;  Laterality: N/A;  . Laparotomy N/A 05/12/2014    Procedure: EXPLORATORY LAPAROTOMY, TOTAL ABDO MINAL HYSTERECTOMY AND BILATERAL SALPINGOOPHERECTOMY;  Surgeon: Alvino Chapel, MD;  Location: [facility];  Service: Gynecology;  Laterality: N/A;  . Esophagogastroduodenoscopy N/A 05/20/2014    Procedure: ESOPHAGOGASTRODUODENOSCOPY (EGD);  Surgeon: Jeryl Columbia, MD;  Location: [facility];  Service: Endoscopy;  Laterality: N/A;    SOCIAL HISTORY:  reports that she quit smoking about 46 years ago. Her smoking use included Cigarettes. She has a 4.5 pack-year smoking history. She has never used smokeless tobacco. She reports that she does not drink alcohol or use  illicit drugs.  FAMILY HISTORY:  Family History  Problem Relation Age of Onset  . Diabetes type II    . Coronary artery disease Mother   . Lung cancer Brother   . Diabetes type II Brother     CURRENT MEDICATIONS: Reviewed per MAR/see medication list  REVIEW OF SYSTEMS:  See HPI otherwise 14 point ROS is negative.  PHYSICAL EXAMINATION  VS:  See VS section  GENERAL: no acute distress, moderately obese body habitus EYES: conjunctivae normal, sclerae  normal, normal eye lids MOUTH/THROAT: lips without lesions,no lesions in the mouth,tongue is without lesions,uvula elevates in midline NECK: supple, trachea midline, no neck masses, no thyroid tenderness, no thyromegaly LYMPHATICS: no LAN in the neck, no supraclavicular LAN RESPIRATORY: breathing is even & unlabored, BS CTAB CARDIAC: Heart rate is irregularly irregular, no murmur,no extra heart sounds, no edema GI:  ABDOMEN: abdomen soft, no BS, no masses, tenderness to palpation present, she has colostomy and lower abdominal incision LIVER/SPLEEN: no hepatomegaly, no splenomegaly MUSCULOSKELETAL: HEAD: normal to inspection  EXTREMITIES: LEFT UPPER EXTREMITY: full range of motion, decreased strength & tone RIGHT UPPER EXTREMITY:  full range of motion, decreased strength & tone LEFT LOWER EXTREMITY:  Minimal range of motion, normal strength & tone RIGHT LOWER EXTREMITY: minimal range of motion, normal strength & tone PSYCHIATRIC: the patient is alert & oriented to person, affect & behavior appropriate  LABS/RADIOLOGY:  Labs reviewed: Basic Metabolic Panel:  Recent Labs  05/18/14 0535  05/21/14 1830 05/22/14 0440 05/24/14 0514  NA 143  < > 140 139 139  K 3.2*  < > 4.2 4.6 4.3  CL 104  < > 102 102 102  CO2 29  < > 27 26 27   GLUCOSE 58*  < > 207* 221* 145*  BUN 29*  < > 18 16 16   CREATININE 1.26*  < > 1.33* 1.28* 1.32*  CALCIUM 8.0*  < > 8.0* 8.1* 8.5  MG 1.3*  --  1.5  --   --   PHOS 2.7  --   --   --   --   < > = values in this interval not displayed. Liver Function Tests:  Recent Labs  05/04/14 1037 05/04/14 1415 05/16/14 0445  AST 20 17 15   ALT 14 14 10   ALKPHOS 61 75 61  BILITOT 1.2 0.8 0.8  PROT 6.9 7.3 4.9*  ALBUMIN 3.2* 3.2* 1.9*   CBC:  Recent Labs  09/24/13 1031 03/04/14 1019 05/04/14 1415  05/23/14 0423 05/24/14 0514 05/25/14 0354  WBC 7.8 8.2 8.2  < > 9.4 8.2 8.0  NEUTROABS 5.9 6.6 6.8  --   --   --   --   HGB 13.0 12.4 12.4  < > 10.2* 9.9*  9.8*  HCT 39.4 38.2 39.1  < > 31.4* 32.1* 31.8*  MCV 98.3 99.5 99.2  < > 96.3 97.0 97.5  PLT 157.0 190.0 187  < > 201 211 227  < > = values in this interval not displayed.  Lipid Panel:  Recent Labs  09/24/13 1031 11/24/13 1104 04/06/14 1615  HDL 31.10* 29.80* 28.70*   CBG:  Recent Labs  05/24/14 1703 05/24/14 2124 05/25/14 0729  GLUCAP 184* 220* 125*   Transthoracic Echocardiography  Patient:    Derrian, Rodak MR #:       [id number] Study Date: 05/17/2014 Gender:     F Age:        85 Height:     162.6 cm Weight:  73.2 kg BSA:        1.84 m^2 Pt. Status: Room:       [place]  Lorenda Peck, [place], [place] M  SONOGRAPHER  Iverson Alamin, RCS  PERFORMING   [facility], Inpatient  cc:  ------------------------------------------------------------------- LV EF: 55% -   60%  ------------------------------------------------------------------- Indications:      424.1 Aortic valve disorders.  ------------------------------------------------------------------- Study Conclusions  - Left ventricle: The cavity size was normal. Wall thickness was   normal. Systolic function was normal. The estimated ejection   fraction was in the range of 55% to 60%. - Aortic valve: There was mild stenosis. - Mitral valve: Calcified annulus. There was mild regurgitation. - Left atrium: The atrium was moderately to severely dilated. - Right ventricle: The cavity size was mildly dilated. Wall   thickness was normal. - Right atrium: The atrium was moderately dilated. - Atrial septum: No defect or patent foramen ovale was identified. - Impressions: LVEDP elevated by E/E&' ratio.  Impressions:  - LVEDP elevated by E/E&' ratio.  -------------------------------------------------------------------  ------------------------------------------------------------------- Left ventricle:   The cavity size was normal. Wall thickness was normal. Systolic function was normal. The estimated ejection fraction was in the range of 55% to 60%.  ------------------------------------------------------------------- Aortic valve:   Moderately calcified leaflets.  Doppler:   There was mild stenosis.  ------------------------------------------------------------------- Aorta:  The aorta was normal, not dilated, and non-diseased.  ------------------------------------------------------------------- Mitral valve:   Calcified annulus.  Doppler:  There was mild regurgitation.  ------------------------------------------------------------------- Left atrium:  The atrium was moderately to severely dilated.  ------------------------------------------------------------------- Atrial septum:  No defect or patent foramen ovale was identified.   ------------------------------------------------------------------- Right ventricle:  The cavity size was mildly dilated. Wall thickness was normal. Systolic function was normal.  ------------------------------------------------------------------- Pulmonic valve:    Structurally normal valve.   Cusp separation was normal.  Doppler:  Transvalvular velocity was within the normal range. There was mild regurgitation.  ------------------------------------------------------------------- Tricuspid valve:   Structurally normal valve.   Leaflet separation was normal.  Doppler:  Transvalvular velocity was within the normal range. There was mild regurgitation.  ------------------------------------------------------------------- Right atrium:  The atrium was moderately dilated.  ------------------------------------------------------------------- Pericardium:  The pericardium was normal in appearance.   ------------------------------------------------------------------- Post procedure conclusions Ascending Aorta:  - The aorta was normal, not dilated, and  non-diseased.  ------------------------------------------------------------------- Prepared and Electronically Authenticated by  Jenkins Rouge, M.D. 2015-05-24T10:20:10  ------------------------------------------------------------------- Measurements   Left ventricle            Value  LV e&', lateral            6.5   cm/s  LV e&', medial             5.6   cm/s  LV e&', average            6.05  cm/s    Right ventricle           Value  RV s&', lateral, S         7.5   cm/s ABDOMEN - 1 VIEW   COMPARISON:  PET-CT 10/07/2013   FINDINGS: Surgical staples project over the right lower quadrant. Surgical drain in the pelvis. A few gas distended mid abdominal small bowel loops. Normal distribution comparisons will throughout colon. Cortical loss at the superior margin of the right pubic bone.   IMPRESSION:  Nonobstructive bowel gas pattern.   PORTABLE CHEST - 1 VIEW   COMPARISON:  Earlier film of the same day   FINDINGS: Left arm PICC line extends to the cavoatrial junction. Stable cardiomegaly. Some worsening of bilateral edema/ infiltrates, right greater than left. . No effusion. Surgical clips above the thoracic inlet. Regional bones unremarkable.   IMPRESSION: 1. PICC line to cavoatrial junction. 2. Interval worsening of bilateral edema/infiltrates, right worse than left.   ASSESSMENT/PLAN:  vulva cancer-status post surgery. Continue rehabilitation. Acute blood loss anemia-status post transfusion. Check hemoglobin level Peptic ulcer disease-continue PPI. Repeat endoscopy planned in 2 months. Diabetes mellitus-continue Levemir. Hypothyroidism-continue levothyroxine Atrial fibrillation-rate controlled Chronic kidney disease stage III-recheck Elevated blood pressure-monitor for now Check CBC and BMP  I have reviewed patient's medical records received at admission/from hospitalization.  CPT CODE: 09811  Gayani Y Dasanayaka, [place] [phone]

## 2014-05-27 IMAGING — CR DG CHEST 1V PORT
1 series · 1 of 1 positions shown · non-contrast
Comparison: Earlier film of the same day

CLINICAL DATA: PICC placement

EXAM:
PORTABLE CHEST - 1 VIEW

[AP]
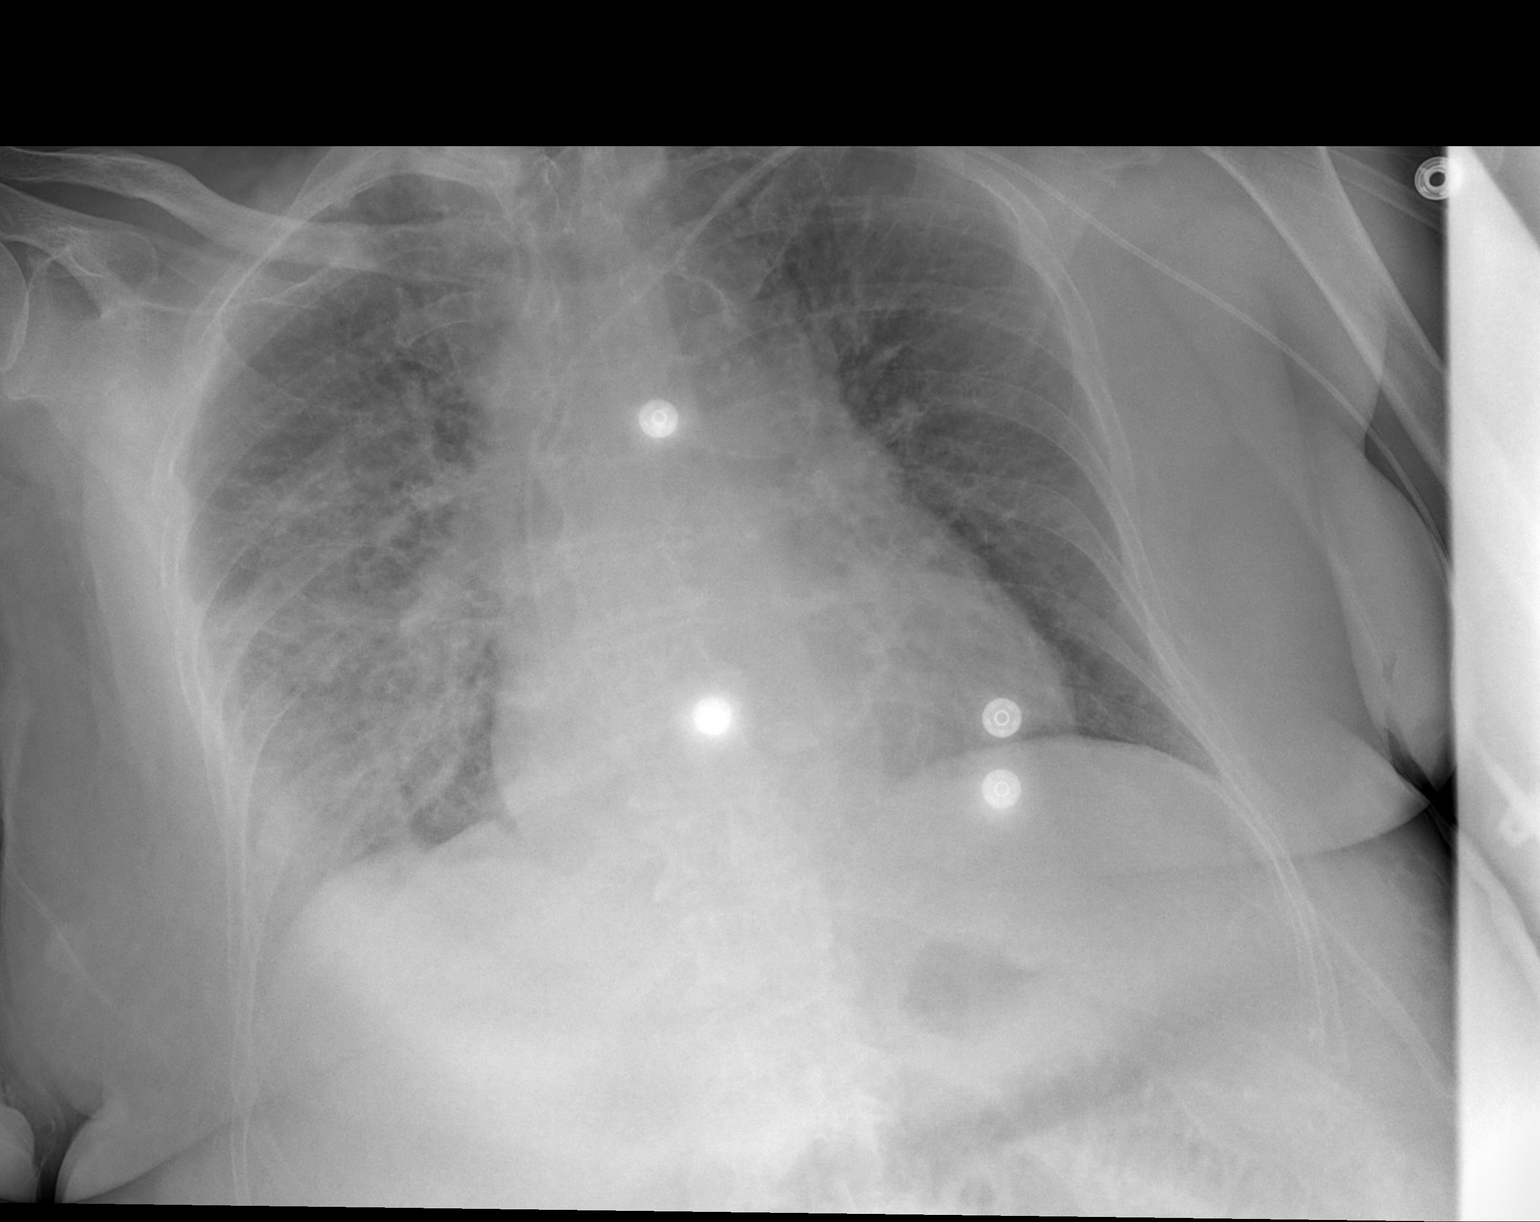

[1 of 1 positions shown; findings below may reference images not displayed]

FINDINGS: Left arm PICC line extends to the cavoatrial junction. Stable
cardiomegaly. Some worsening of bilateral edema/ infiltrates, right
greater than left. .
No effusion.
Surgical clips above the thoracic inlet. Regional bones
unremarkable.
IMPRESSION: 1. PICC line to cavoatrial junction.
2. Interval worsening of bilateral edema/infiltrates, right worse
than left.

## 2014-05-28 ENCOUNTER — Telehealth: Payer: Self-pay | Admitting: *Deleted

## 2014-05-28 ENCOUNTER — Encounter: Payer: Self-pay | Admitting: *Deleted

## 2014-05-28 NOTE — Telephone Encounter (Signed)
Called [facility] left msg on supervisors vm with VO request [email or web address], factor 10a labs to be draw. Requested call back to confirm msg [email or web address].

## 2014-05-29 ENCOUNTER — Ambulatory Visit: Payer: Medicare Other | Attending: Gynecology | Admitting: Gynecology

## 2014-05-29 ENCOUNTER — Encounter: Payer: Self-pay | Admitting: Gynecology

## 2014-05-29 DIAGNOSIS — E039 Hypothyroidism, unspecified: Secondary | ICD-10-CM | POA: Insufficient documentation

## 2014-05-29 DIAGNOSIS — Z794 Long term (current) use of insulin: Secondary | ICD-10-CM | POA: Insufficient documentation

## 2014-05-29 DIAGNOSIS — I4891 Unspecified atrial fibrillation: Secondary | ICD-10-CM | POA: Insufficient documentation

## 2014-05-29 DIAGNOSIS — I10 Essential (primary) hypertension: Secondary | ICD-10-CM | POA: Insufficient documentation

## 2014-05-29 DIAGNOSIS — E119 Type 2 diabetes mellitus without complications: Secondary | ICD-10-CM | POA: Insufficient documentation

## 2014-05-29 DIAGNOSIS — Z87891 Personal history of nicotine dependence: Secondary | ICD-10-CM | POA: Insufficient documentation

## 2014-05-29 DIAGNOSIS — N949 Unspecified condition associated with female genital organs and menstrual cycle: Secondary | ICD-10-CM

## 2014-05-29 DIAGNOSIS — Z8673 Personal history of transient ischemic attack (TIA), and cerebral infarction without residual deficits: Secondary | ICD-10-CM | POA: Insufficient documentation

## 2014-05-29 DIAGNOSIS — Z79899 Other long term (current) drug therapy: Secondary | ICD-10-CM | POA: Insufficient documentation

## 2014-05-29 DIAGNOSIS — C519 Malignant neoplasm of vulva, unspecified: Secondary | ICD-10-CM | POA: Insufficient documentation

## 2014-05-29 DIAGNOSIS — E785 Hyperlipidemia, unspecified: Secondary | ICD-10-CM | POA: Insufficient documentation

## 2014-05-29 NOTE — Patient Instructions (Signed)
Return to see me in one week. Please take Levaquin 750 mg once a day. Please use a donut take pressure off the area that is tender.

## 2014-05-29 NOTE — Progress Notes (Signed)
Consult Note: Gyn-Onc   [name] 80 y.o. female  Chief Complaint  Patient presents with  . 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 Perineal pain. Possible cellulitis.  Plan:  Given the erythema in the area where the patient has her most severe pain near the natal cleft, we will start the patient on Levaquin 750 mg daily for the next 12 days. I would like her to use a full doughnut when she is sitting. The perineal packing should be changed twice a day. She returned to see me in one week.   Interval History: Patient returns today having undergone a posterior pelvic exoneration on 05/12/2014. Her intraoperative and postoperative course were uncomplicated. Initially she had considerable pain relief after resecting the vulvar/anal cancer. She was discharged to [facility] place for rehabilitation. Today she tells me she is having severe pain when she is sitting. She is having some physical therapy. She says she's eating well.  Laboratory work is reviewed and reveals a hemoglobin of 9.9, white count 9.8, and platelet count 237,000. Electrolytes are essentially normal except for a glucose of 154 units per mL. The patient is taking low molecular weight heparin and a low molecular weight heparin level is pending  HPI:  The patient had vulvar carcinoma initially diagnosed in September 2007. At that time she underwent a modified radical vulvectomy and right inguinal lymphadenectomy. Lymph nodes and surgical margins were negative. She subsequently developed a recurrence on the left vulva in October 2008 underwent left modified radical vulvectomy. Given her age and overall poor performance status we did not perform an inguinal lymphadenectomy.  The patient subsequently had a third recurrence of October 2009 and this was excised and the rhomboid flap was used to fill the surgical defect. Surgical margins were again negative. Her most recent  recurrences in September 2012 which is again excised.  She had a superficial wound separation which closed by secondary intent.   She had yet another recurrence in November 2014 treated with external beam radiation therapy because of the lesion location very close to the anus. She had a partial response but then had progressive disease around the anus with severe pain. As a palliative maneuver our only option was to perform a posterior pelvic exam duration on 05/12/2014. She her initial postoperative course was uncomplicated.     Review of System Ten point review of systems is negative except as noted above      Allergies  Allergen Reactions  . Tenex [Guanfacine Hcl] Other (See Comments)    hypotension  . Crestor [Rosuvastatin] Other (See Comments)    Myalgia    Past Medical History  Diagnosis Date  . Hypertension   . Hyperlipidemia   . Obesity   . Hypothyroidism   . Vulvar cancer, carcinoma 11/03/2011  . Atrial fibrillation   . Cancer 2007    vulva-invasive well diff squamous cell ca  . CVA (cerebral infarction)   . Dyslipidemia   . History of radiation therapy 10/20/2013-12/01/2013    60 gray to perineum area and lower pelvis  . Diabetes mellitus 05-04-14    "Diabetic coma" 1 week ago -[facility] until pt. feeling improved and stable.  . Shortness of breath     occ. with coughing up phelgm usually mornings  . 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 Abnormal voice 05-04-14    "raspy voice" with inspiratory  effort-Dr. Lissa Hoard aware 05-04-14  . Stroke 05/2000    right brain CVA pre H&P 2001-  . Anemia     Past Surgical History  Procedure Laterality Date  . Vulvar lesion removal  2012  . Breast surgery      Lumpectomy  . Dilation and curettage of uterus    . Cystourethroplasty / ureteroneocystostomy    . Radical vulvectomy    . Lymphadenectomy  2007    R inguinal femoral  . Radical vulvectomy  02/27/12  . Vulvar lesion removal  02/27/2012     Procedure: VULVAR LESION;  Surgeon: Alvino Chapel, MD;  Location: [facility];  Service: Gynecology;  Laterality: N/A;  . Right carotid endarterctomy    . Left eye surgery      , permanently stitched eye together  . Vulvectomy N/A 05/12/2014    Procedure: RADICAL VULVECTOMY WITH COLOSTOMY;  Surgeon: Alvino Chapel, MD;  Location: [facility];  Service: Gynecology;  Laterality: N/A;  . Laparotomy N/A 05/12/2014    Procedure: EXPLORATORY LAPAROTOMY, TOTAL ABDO MINAL HYSTERECTOMY AND BILATERAL SALPINGOOPHERECTOMY;  Surgeon: Alvino Chapel, MD;  Location: [facility];  Service: Gynecology;  Laterality: N/A;  . Esophagogastroduodenoscopy N/A 05/20/2014    Procedure: ESOPHAGOGASTRODUODENOSCOPY (EGD);  Surgeon: Jeryl Columbia, MD;  Location: [facility];  Service: Endoscopy;  Laterality: N/A;    Current Outpatient Prescriptions  Medication Sig Dispense Refill  . atorvastatin (LIPITOR) 20 MG tablet Take 1 tablet (20 mg total) by mouth daily.  30 tablet  3  . calcitRIOL (ROCALTROL) 0.25 MCG capsule Take 0.25 mcg by mouth every Monday, Wednesday, and Friday.      . diltiazem (DILACOR XR) 180 MG 24 hr capsule Take 180 mg by mouth every morning.      . enoxaparin (LOVENOX) 80 MG/0.8ML injection Inject 0.6 mLs (60 mg total) into the skin every 12 (twelve) hours.  22.4 Syringe  2  . furosemide (LASIX) 20 MG tablet Take 20 mg by mouth every morning.      . insulin lispro (HUMALOG) 100 UNIT/ML injection Inject 14-22 Units into the skin 3 (three) times daily before meals. 14 in the morning 14 at lunch and 22 at supper      . levothyroxine (SYNTHROID, LEVOTHROID) 100 MCG tablet Take 100 mcg by mouth as directed. Every day except sunday      . metoprolol (LOPRESSOR) 50 MG tablet Take 50 mg by mouth 2 (two) times daily.      . ondansetron (ZOFRAN) 4 MG/2ML SOLN injection Inject 2 mLs (4 mg total) into the vein every 6 (six) hours as needed for nausea.  2 mL  0  . oxyCODONE-acetaminophen (PERCOCET) 5-325  MG per tablet Take 1-2 tablets by mouth every 6 (six) hours as needed for severe pain.  90 tablet  0  . pantoprazole (PROTONIX) 40 MG tablet Take 1 tablet (40 mg total) by mouth 2 (two) times daily before a meal.  60 tablet  3  . potassium chloride (K-DUR,KLOR-CON) 10 MEQ tablet Take 10 mEq by mouth daily.      . insulin detemir (LEVEMIR) 100 UNIT/ML injection Inject 16-22 Units into the skin 2 (two) times daily. 22 in am and 16  in pm       No current facility-administered medications for this visit.    History   Social History  . Marital Status: Married    Spouse Name: N/A    Number of Children: N/A  . Years of Education: N/A  Occupational History  . Not on file.   Social History Main Topics  . Smoking status: Former Smoker -- 0.25 packs/day for 18 years    Types: Cigarettes    Quit date: 11/03/1967  . Smokeless tobacco: Never Used  . Alcohol Use: No  . Drug Use: No  . Sexual Activity: Not Currently   Other Topics Concern  . Not on file   Social History Narrative  . No narrative on file    Family History  Problem Relation Age of Onset  . Diabetes type II    . Coronary artery disease Mother   . Lung cancer Brother   . Diabetes type II Brother        Vitals: There were no vitals taken for this visit.  Physical Exam: In general the patient is a pleasant elderly white female who initially is under considerable amount of pain associated with her perineum. Once she is rolled onto her side her pain is relieved.  HEENT is negative except that her left eyelid has been partially closed surgically.. Neck is supple without thyromegaly.  There is no supraclavicular or inguinal adenopathy.  The abdomen is soft nontender no masses organomegaly or ascites is noted.  Pelvic exam EGBUS  the packing is removed from the perineal defect and tissues appear healthy. Margins of the wound appear healthy and noninfected. However in this midline near the needle cleft is an area of  exquisite point tenderness. This area is somewhat more erythematous and the rest of the vulva. The deeper portion of the perineum defect has some granulation tissue and no significant necrosis. The dressing is removed and a new dressings soaked in saline as replaced.    Alvino Chapel, MD 05/29/2014, 2:43 PM                         Consult Note: Gyn-Onc   [name] 80 y.o. female  Chief Complaint  Patient presents with  . Vulvar Cancer    Follow up     Interval History:   HPI:  Allergies  Allergen Reactions  . Tenex [Guanfacine Hcl] Other (See Comments)    hypotension  . Crestor [Rosuvastatin] Other (See Comments)    Myalgia    Past Medical History  Diagnosis Date  . Hypertension   . Hyperlipidemia   . Obesity   . Hypothyroidism   . Vulvar cancer, carcinoma 11/03/2011  . Atrial fibrillation   . Cancer 2007    vulva-invasive well diff squamous cell ca  . CVA (cerebral infarction)   . Dyslipidemia   . History of radiation therapy 10/20/2013-12/01/2013    60 gray to perineum area and lower pelvis  . Diabetes mellitus 05-04-14    "Diabetic coma" 1 week ago -[facility]. [facility] until pt. feeling improved and stable.  . Shortness of breath     occ. with coughing up phelgm usually mornings  . 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 Abnormal voice 05-04-14    "raspy voice" with inspiratory effort-Dr. Lissa Hoard aware 05-04-14  . Stroke 05/2000    right brain CVA pre H&P 2001-  . Anemia     Past Surgical History  Procedure Laterality Date  . Vulvar lesion removal  2012  . Breast surgery      Lumpectomy  . Dilation and curettage of uterus    . Cystourethroplasty / ureteroneocystostomy    . Radical vulvectomy    .  Lymphadenectomy  2007    R inguinal femoral  . Radical vulvectomy  02/27/12  . Vulvar lesion removal  02/27/2012    Procedure: VULVAR LESION;  Surgeon: Alvino Chapel, MD;  Location: [facility];  Service:  Gynecology;  Laterality: N/A;  . Right carotid endarterctomy    . Left eye surgery      , permanently stitched eye together  . Vulvectomy N/A 05/12/2014    Procedure: RADICAL VULVECTOMY WITH COLOSTOMY;  Surgeon: Alvino Chapel, MD;  Location: [facility];  Service: Gynecology;  Laterality: N/A;  . Laparotomy N/A 05/12/2014    Procedure: EXPLORATORY LAPAROTOMY, TOTAL ABDO MINAL HYSTERECTOMY AND BILATERAL SALPINGOOPHERECTOMY;  Surgeon: Alvino Chapel, MD;  Location: [facility];  Service: Gynecology;  Laterality: N/A;  . Esophagogastroduodenoscopy N/A 05/20/2014    Procedure: ESOPHAGOGASTRODUODENOSCOPY (EGD);  Surgeon: Jeryl Columbia, MD;  Location: [facility];  Service: Endoscopy;  Laterality: N/A;    Current Outpatient Prescriptions  Medication Sig Dispense Refill  . atorvastatin (LIPITOR) 20 MG tablet Take 1 tablet (20 mg total) by mouth daily.  30 tablet  3  . calcitRIOL (ROCALTROL) 0.25 MCG capsule Take 0.25 mcg by mouth every Monday, Wednesday, and Friday.      . diltiazem (DILACOR XR) 180 MG 24 hr capsule Take 180 mg by mouth every morning.      . enoxaparin (LOVENOX) 80 MG/0.8ML injection Inject 0.6 mLs (60 mg total) into the skin every 12 (twelve) hours.  22.4 Syringe  2  . furosemide (LASIX) 20 MG tablet Take 20 mg by mouth every morning.      . insulin lispro (HUMALOG) 100 UNIT/ML injection Inject 14-22 Units into the skin 3 (three) times daily before meals. 14 in the morning 14 at lunch and 22 at supper      . levothyroxine (SYNTHROID, LEVOTHROID) 100 MCG tablet Take 100 mcg by mouth as directed. Every day except sunday      . metoprolol (LOPRESSOR) 50 MG tablet Take 50 mg by mouth 2 (two) times daily.      . ondansetron (ZOFRAN) 4 MG/2ML SOLN injection Inject 2 mLs (4 mg total) into the vein every 6 (six) hours as needed for nausea.  2 mL  0  . oxyCODONE-acetaminophen (PERCOCET) 5-325 MG per tablet Take 1-2 tablets by mouth every 6 (six) hours as needed for severe pain.  90  tablet  0  . pantoprazole (PROTONIX) 40 MG tablet Take 1 tablet (40 mg total) by mouth 2 (two) times daily before a meal.  60 tablet  3  . potassium chloride (K-DUR,KLOR-CON) 10 MEQ tablet Take 10 mEq by mouth daily.      . insulin detemir (LEVEMIR) 100 UNIT/ML injection Inject 16-22 Units into the skin 2 (two) times daily. 22 in am and 16  in pm       No current facility-administered medications for this visit.    History   Social History  . Marital Status: Married    Spouse Name: N/A    Number of Children: N/A  . Years of Education: N/A   Occupational History  . Not on file.   Social History Main Topics  . Smoking status: Former Smoker -- 0.25 packs/day for 18 years    Types: Cigarettes    Quit date: 11/03/1967  . Smokeless tobacco: Never Used  . Alcohol Use: No  . Drug Use: No  . Sexual Activity: Not Currently   Other Topics Concern  . Not on file   Social History  Narrative  . No narrative on file    Family History  Problem Relation Age of Onset  . Diabetes type II    . Coronary artery disease Mother   . Lung cancer Brother   . Diabetes type II Brother     Review of Systems:  Vitals: There were no vitals taken for this visit.  Physical Exam:  Assessment/Plan:   Alvino Chapel, MD 05/29/2014, 2:43 PM                         Consult Note: Gyn-Onc   [name] 80 y.o. female  Chief Complaint  Patient presents with  . Vulvar Cancer    Follow up     Interval History: The patient returns today  last being seen in April 2013. Subsequent to that visit, she's had a number of medical problems including a stroke, atrial fibrillation, and some ophthalmologic problems. On direct questioning the patient does admit and her husband concurs that there is a new lesion on her vulva. She says it "stings". She has not had any bleeding.  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 At that time she  underwent a modified radical vulvectomy and right inguinal lymphadenectomy. Lymph nodes and surgical margins were negative. She subsequently developed a recurrence on the left vulva in October 2008 underwent left modified radical vulvectomy. Given her age and overall poor performance status we did not perform an inguinal lymphadenectomy.  The patient subsequently had a third recurrence of October 2009 and this was excised and the rhomboid flap was used to fill the surgical defect. Surgical margins were again negative. Her most recent recurrences in September 2012 which is again excised.  She had a superficial wound separation which closed by secondary intent.      Review of System Ten point review of systems is negative except as noted above      Allergies  Allergen Reactions  . Tenex [Guanfacine Hcl] Other (See Comments)    hypotension  . Crestor [Rosuvastatin] Other (See Comments)    Myalgia    Past Medical History  Diagnosis Date  . Hypertension   . Hyperlipidemia   . Obesity   . Hypothyroidism   . Vulvar cancer, carcinoma 11/03/2011  . Atrial fibrillation   . Cancer 2007    vulva-invasive well diff squamous cell ca  . CVA (cerebral infarction)   . Dyslipidemia   . History of radiation therapy 10/20/2013-12/01/2013    60 gray to perineum area and lower pelvis  . Diabetes mellitus 05-04-14    "Diabetic coma" 1 week ago -[facility]. [facility] until pt. feeling improved and stable.  . Shortness of breath     occ. with coughing up phelgm usually mornings  . 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 Abnormal voice 05-04-14    "raspy voice" with inspiratory effort-Dr. Lissa Hoard aware 05-04-14  . Stroke 05/2000    right brain CVA pre H&P 2001-  . Anemia     Past Surgical History  Procedure Laterality Date  . Vulvar lesion removal  2012  . Breast surgery      Lumpectomy  . Dilation and curettage of uterus    . Cystourethroplasty / ureteroneocystostomy    .  Radical vulvectomy    . Lymphadenectomy  2007    R inguinal femoral  . Radical vulvectomy  02/27/12  . Vulvar lesion removal  02/27/2012    Procedure: VULVAR LESION;  Surgeon: Alvino Chapel, MD;  Location: [facility];  Service: Gynecology;  Laterality: N/A;  . Right carotid endarterctomy    . Left eye surgery      , permanently stitched eye together  . Vulvectomy N/A 05/12/2014    Procedure: RADICAL VULVECTOMY WITH COLOSTOMY;  Surgeon: Alvino Chapel, MD;  Location: [facility];  Service: Gynecology;  Laterality: N/A;  . Laparotomy N/A 05/12/2014    Procedure: EXPLORATORY LAPAROTOMY, TOTAL ABDO MINAL HYSTERECTOMY AND BILATERAL SALPINGOOPHERECTOMY;  Surgeon: Alvino Chapel, MD;  Location: [facility];  Service: Gynecology;  Laterality: N/A;  . Esophagogastroduodenoscopy N/A 05/20/2014    Procedure: ESOPHAGOGASTRODUODENOSCOPY (EGD);  Surgeon: Jeryl Columbia, MD;  Location: [facility];  Service: Endoscopy;  Laterality: N/A;    Current Outpatient Prescriptions  Medication Sig Dispense Refill  . atorvastatin (LIPITOR) 20 MG tablet Take 1 tablet (20 mg total) by mouth daily.  30 tablet  3  . calcitRIOL (ROCALTROL) 0.25 MCG capsule Take 0.25 mcg by mouth every Monday, Wednesday, and Friday.      . diltiazem (DILACOR XR) 180 MG 24 hr capsule Take 180 mg by mouth every morning.      . enoxaparin (LOVENOX) 80 MG/0.8ML injection Inject 0.6 mLs (60 mg total) into the skin every 12 (twelve) hours.  22.4 Syringe  2  . furosemide (LASIX) 20 MG tablet Take 20 mg by mouth every morning.      . insulin lispro (HUMALOG) 100 UNIT/ML injection Inject 14-22 Units into the skin 3 (three) times daily before meals. 14 in the morning 14 at lunch and 22 at supper      . levothyroxine (SYNTHROID, LEVOTHROID) 100 MCG tablet Take 100 mcg by mouth as directed. Every day except sunday      . metoprolol (LOPRESSOR) 50 MG tablet Take 50 mg by mouth 2 (two) times daily.      . ondansetron (ZOFRAN) 4 MG/2ML SOLN  injection Inject 2 mLs (4 mg total) into the vein every 6 (six) hours as needed for nausea.  2 mL  0  . oxyCODONE-acetaminophen (PERCOCET) 5-325 MG per tablet Take 1-2 tablets by mouth every 6 (six) hours as needed for severe pain.  90 tablet  0  . pantoprazole (PROTONIX) 40 MG tablet Take 1 tablet (40 mg total) by mouth 2 (two) times daily before a meal.  60 tablet  3  . potassium chloride (K-DUR,KLOR-CON) 10 MEQ tablet Take 10 mEq by mouth daily.      . insulin detemir (LEVEMIR) 100 UNIT/ML injection Inject 16-22 Units into the skin 2 (two) times daily. 22 in am and 16  in pm       No current facility-administered medications for this visit.    History   Social History  . Marital Status: Married    Spouse Name: N/A    Number of Children: N/A  . Years of Education: N/A   Occupational History  . Not on file.   Social History Main Topics  . Smoking status: Former Smoker -- 0.25 packs/day for 18 years    Types: Cigarettes    Quit date: 11/03/1967  . Smokeless tobacco: Never Used  . Alcohol Use: No  . Drug Use: No  . Sexual Activity: Not Currently   Other Topics Concern  . Not on file   Social History Narrative  . No narrative on file    Family History  Problem Relation Age of Onset  . Diabetes type  II    . Coronary artery disease Mother   . Lung cancer Brother   . Diabetes type II Brother        Vitals: There were no vitals taken for this visit.  Physical Exam: In general the patient is a pleasant elderly white female no acute distress.  HEENT is negative except that her left eyelid has been partially closed surgically.. Neck is supple without thyromegaly.  There is no supraclavicular or inguinal adenopathy.  The abdomen is soft nontender no masses organomegaly or ascites is noted.  Pelvic exam EGBUS is status post radical vulvectomy with advancement flaps. She now has a new 4 cm fungating lesion just to the left of the anus. Assessment/Plan:recurrent vulvar  cancer adjacent to the anus.   Given the location of this new lesion, I do not believe that surgery would be appropriate as the patient would require an extensive operation including resection of the anal sphincter colostomy. Therefore would like to refer her to radiation oncology for consideration of external beam radiation therapy to this lesion. The patient and her husband are in agreement with this plan.   Alvino Chapel, MD 05/29/2014, 2:43 PM                         Consult Note: Gyn-Onc   [name] 80 y.o. female  Chief Complaint  Patient presents with  . Vulvar Cancer    Follow up     Interval History:   HPI:  Allergies  Allergen Reactions  . Tenex [Guanfacine Hcl] Other (See Comments)    hypotension  . Crestor [Rosuvastatin] Other (See Comments)    Myalgia    Past Medical History  Diagnosis Date  . Hypertension   . Hyperlipidemia   . Obesity   . Hypothyroidism   . Vulvar cancer, carcinoma 11/03/2011  . Atrial fibrillation   . Cancer 2007    vulva-invasive well diff squamous cell ca  . CVA (cerebral infarction)   . Dyslipidemia   . History of radiation therapy 10/20/2013-12/01/2013    60 gray to perineum area and lower pelvis  . Diabetes mellitus 05-04-14    "Diabetic coma" 1 week ago -[facility]. [facility] until pt. feeling improved and stable.  . Shortness of breath     occ. with coughing up phelgm usually mornings  . 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 Abnormal voice 05-04-14    "raspy voice" with inspiratory effort-Dr. Lissa Hoard aware 05-04-14  . Stroke 05/2000    right brain CVA pre H&P 2001-  . Anemia     Past Surgical History  Procedure Laterality Date  . Vulvar lesion removal  2012  . Breast surgery      Lumpectomy  . Dilation and curettage of uterus    . Cystourethroplasty / ureteroneocystostomy    . Radical vulvectomy    . Lymphadenectomy  2007    R inguinal femoral  . Radical  vulvectomy  02/27/12  . Vulvar lesion removal  02/27/2012    Procedure: VULVAR LESION;  Surgeon: Alvino Chapel, MD;  Location: [facility];  Service: Gynecology;  Laterality: N/A;  . Right carotid endarterctomy    . Left eye surgery      , permanently stitched eye together  . Vulvectomy N/A 05/12/2014    Procedure: RADICAL VULVECTOMY WITH COLOSTOMY;  Surgeon: Alvino Chapel, MD;  Location: [facility];  Service: Gynecology;  Laterality: N/A;  .  Laparotomy N/A 05/12/2014    Procedure: EXPLORATORY LAPAROTOMY, TOTAL ABDO MINAL HYSTERECTOMY AND BILATERAL SALPINGOOPHERECTOMY;  Surgeon: Alvino Chapel, MD;  Location: [facility];  Service: Gynecology;  Laterality: N/A;  . Esophagogastroduodenoscopy N/A 05/20/2014    Procedure: ESOPHAGOGASTRODUODENOSCOPY (EGD);  Surgeon: Jeryl Columbia, MD;  Location: [facility];  Service: Endoscopy;  Laterality: N/A;    Current Outpatient Prescriptions  Medication Sig Dispense Refill  . atorvastatin (LIPITOR) 20 MG tablet Take 1 tablet (20 mg total) by mouth daily.  30 tablet  3  . calcitRIOL (ROCALTROL) 0.25 MCG capsule Take 0.25 mcg by mouth every Monday, Wednesday, and Friday.      . diltiazem (DILACOR XR) 180 MG 24 hr capsule Take 180 mg by mouth every morning.      . enoxaparin (LOVENOX) 80 MG/0.8ML injection Inject 0.6 mLs (60 mg total) into the skin every 12 (twelve) hours.  22.4 Syringe  2  . furosemide (LASIX) 20 MG tablet Take 20 mg by mouth every morning.      . insulin lispro (HUMALOG) 100 UNIT/ML injection Inject 14-22 Units into the skin 3 (three) times daily before meals. 14 in the morning 14 at lunch and 22 at supper      . levothyroxine (SYNTHROID, LEVOTHROID) 100 MCG tablet Take 100 mcg by mouth as directed. Every day except sunday      . metoprolol (LOPRESSOR) 50 MG tablet Take 50 mg by mouth 2 (two) times daily.      . ondansetron (ZOFRAN) 4 MG/2ML SOLN injection Inject 2 mLs (4 mg total) into the vein every 6 (six) hours as needed for  nausea.  2 mL  0  . oxyCODONE-acetaminophen (PERCOCET) 5-325 MG per tablet Take 1-2 tablets by mouth every 6 (six) hours as needed for severe pain.  90 tablet  0  . pantoprazole (PROTONIX) 40 MG tablet Take 1 tablet (40 mg total) by mouth 2 (two) times daily before a meal.  60 tablet  3  . potassium chloride (K-DUR,KLOR-CON) 10 MEQ tablet Take 10 mEq by mouth daily.      . insulin detemir (LEVEMIR) 100 UNIT/ML injection Inject 16-22 Units into the skin 2 (two) times daily. 22 in am and 16  in pm       No current facility-administered medications for this visit.    History   Social History  . Marital Status: Married    Spouse Name: N/A    Number of Children: N/A  . Years of Education: N/A   Occupational History  . Not on file.   Social History Main Topics  . Smoking status: Former Smoker -- 0.25 packs/day for 18 years    Types: Cigarettes    Quit date: 11/03/1967  . Smokeless tobacco: Never Used  . Alcohol Use: No  . Drug Use: No  . Sexual Activity: Not Currently   Other Topics Concern  . Not on file   Social History Narrative  . No narrative on file    Family History  Problem Relation Age of Onset  . Diabetes type II    . Coronary artery disease Mother   . Lung cancer Brother   . Diabetes type II Brother     Review of Systems:  Vitals: There were no vitals taken for this visit.  Physical Exam:  Assessment/Plan:   Alvino Chapel, MD 05/29/2014, 2:43 PM

## 2014-06-05 ENCOUNTER — Ambulatory Visit: Payer: No Typology Code available for payment source | Attending: Gynecology | Admitting: Gynecology

## 2014-06-05 ENCOUNTER — Encounter: Payer: Self-pay | Admitting: Gynecology

## 2014-06-05 VITALS — BP 97/65 | HR 111 | Temp 97.7°F | Resp 18 | Ht 64.0 in | Wt 138.0 lb

## 2014-06-05 DIAGNOSIS — E785 Hyperlipidemia, unspecified: Secondary | ICD-10-CM | POA: Insufficient documentation

## 2014-06-05 DIAGNOSIS — E039 Hypothyroidism, unspecified: Secondary | ICD-10-CM | POA: Insufficient documentation

## 2014-06-05 DIAGNOSIS — Z933 Colostomy status: Secondary | ICD-10-CM | POA: Diagnosis not present

## 2014-06-05 DIAGNOSIS — E119 Type 2 diabetes mellitus without complications: Secondary | ICD-10-CM | POA: Insufficient documentation

## 2014-06-05 DIAGNOSIS — I4891 Unspecified atrial fibrillation: Secondary | ICD-10-CM | POA: Diagnosis not present

## 2014-06-05 DIAGNOSIS — Z79899 Other long term (current) drug therapy: Secondary | ICD-10-CM | POA: Diagnosis not present

## 2014-06-05 DIAGNOSIS — Z794 Long term (current) use of insulin: Secondary | ICD-10-CM | POA: Diagnosis not present

## 2014-06-05 DIAGNOSIS — Z8673 Personal history of transient ischemic attack (TIA), and cerebral infarction without residual deficits: Secondary | ICD-10-CM | POA: Diagnosis not present

## 2014-06-05 DIAGNOSIS — I10 Essential (primary) hypertension: Secondary | ICD-10-CM | POA: Diagnosis not present

## 2014-06-05 DIAGNOSIS — C519 Malignant neoplasm of vulva, unspecified: Secondary | ICD-10-CM | POA: Diagnosis not present

## 2014-06-05 NOTE — Progress Notes (Signed)
Consult Note: Gyn-Onc   [name] 80 y.o. female  Chief Complaint  Patient presents with  . 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  Improved healing of vulvar defect. Improved pain control.  Plan: Patient will continue twice daily wound dressing changes. Increase physical therapy. Return to see me 06/22/2014.   Interval History: Patient returns today for continuing followup. Over the past week she's been on Levaquin. She reports that the pain in her vulva is much improved. She's been able to sit on a doughnut. Colostomy function appears to be normal. She's continuing physical therapy. Pain is controlled adequately with Tylenol. She denies any vaginal bleeding. She continues to have a Foley catheter in place. She denies any fever or chills. Appetite is good.  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 At that time she underwent a modified radical vulvectomy and right inguinal lymphadenectomy. Lymph nodes and surgical margins were negative. She subsequently developed a recurrence on the left vulva in October 2008 underwent left modified radical vulvectomy. Given her age and overall poor performance status we did not perform an inguinal lymphadenectomy.  The patient subsequently had a third recurrence of October 2009 and this was excised and the rhomboid flap was used to fill the surgical defect. Surgical margins were again negative. Her most recent recurrences in September 2012 which is again excised.  She had a superficial wound separation which closed by secondary intent.   She had yet another recurrence in November 2014 treated with external beam radiation therapy because of the lesion location very close to the anus. She had a partial response but then had progressive disease around the anus with severe pain. As a palliative maneuver our only option was to perform a posterior pelvic  exenteration on 05/12/2014. She her initial postoperative course was uncomplicated.     Review of System Ten point review of systems is negative except as noted above      Allergies  Allergen Reactions  . Tenex [Guanfacine Hcl] Other (See Comments)    hypotension  . Crestor [Rosuvastatin] Other (See Comments)    Myalgia    Past Medical History  Diagnosis Date  . Hypertension   . Hyperlipidemia   . Obesity   . Hypothyroidism   . Vulvar cancer, carcinoma 11/03/2011  . Atrial fibrillation   . Cancer 2007    vulva-invasive well diff squamous cell ca  . CVA (cerebral infarction)   . Dyslipidemia   . History of radiation therapy 10/20/2013-12/01/2013    60 gray to perineum area and lower pelvis  . Diabetes mellitus 05-04-14    "Diabetic coma" 1 week ago -[facility] until pt. feeling improved and stable.  . Shortness of breath     occ. with coughing up phelgm usually mornings  . 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 Abnormal voice 05-04-14    "raspy voice" with inspiratory effort-Dr. Lissa Hoard aware 05-04-14  . Stroke 05/2000    right brain CVA pre H&P 2001-  . Anemia     Past Surgical History  Procedure Laterality Date  . Vulvar lesion removal  2012  . Breast surgery      Lumpectomy  . Dilation and curettage of uterus    . Cystourethroplasty / ureteroneocystostomy    . Radical vulvectomy    . Lymphadenectomy  2007    R inguinal  femoral  . Radical vulvectomy  02/27/12  . Vulvar lesion removal  02/27/2012    Procedure: VULVAR LESION;  Surgeon: Alvino Chapel, MD;  Location: [facility];  Service: Gynecology;  Laterality: N/A;  . Right carotid endarterctomy    . Left eye surgery      , permanently stitched eye together  . Vulvectomy N/A 05/12/2014    Procedure: RADICAL VULVECTOMY WITH COLOSTOMY;  Surgeon: Alvino Chapel, MD;  Location: [facility];  Service: Gynecology;  Laterality: N/A;  . Laparotomy N/A 05/12/2014    Procedure:  EXPLORATORY LAPAROTOMY, TOTAL ABDO MINAL HYSTERECTOMY AND BILATERAL SALPINGOOPHERECTOMY;  Surgeon: Alvino Chapel, MD;  Location: [facility];  Service: Gynecology;  Laterality: N/A;  . Esophagogastroduodenoscopy N/A 05/20/2014    Procedure: ESOPHAGOGASTRODUODENOSCOPY (EGD);  Surgeon: Jeryl Columbia, MD;  Location: [facility];  Service: Endoscopy;  Laterality: N/A;    Current Outpatient Prescriptions  Medication Sig Dispense Refill  . atorvastatin (LIPITOR) 20 MG tablet Take 1 tablet (20 mg total) by mouth daily.  30 tablet  3  . calcitRIOL (ROCALTROL) 0.25 MCG capsule Take 0.25 mcg by mouth every Monday, Wednesday, and Friday.      . diltiazem (DILACOR XR) 180 MG 24 hr capsule Take 180 mg by mouth every morning.      . enoxaparin (LOVENOX) 80 MG/0.8ML injection Inject 0.6 mLs (60 mg total) into the skin every 12 (twelve) hours.  22.4 Syringe  2  . furosemide (LASIX) 20 MG tablet Take 20 mg by mouth every morning.      . insulin detemir (LEVEMIR) 100 UNIT/ML injection Inject 16-22 Units into the skin 2 (two) times daily. 22 in am and 16  in pm      . insulin lispro (HUMALOG) 100 UNIT/ML injection Inject 14-22 Units into the skin 3 (three) times daily before meals. 14 in the morning 14 at lunch and 22 at supper      . levothyroxine (SYNTHROID, LEVOTHROID) 100 MCG tablet Take 100 mcg by mouth as directed. Every day except sunday      . metoprolol (LOPRESSOR) 50 MG tablet Take 50 mg by mouth 2 (two) times daily.      . ondansetron (ZOFRAN) 4 MG/2ML SOLN injection Inject 2 mLs (4 mg total) into the vein every 6 (six) hours as needed for nausea.  2 mL  0  . oxyCODONE-acetaminophen (PERCOCET) 5-325 MG per tablet Take 1-2 tablets by mouth every 6 (six) hours as needed for severe pain.  90 tablet  0  . pantoprazole (PROTONIX) 40 MG tablet Take 1 tablet (40 mg total) by mouth 2 (two) times daily before a meal.  60 tablet  3  . potassium chloride (K-DUR,KLOR-CON) 10 MEQ tablet Take 10 mEq by mouth daily.        No current facility-administered medications for this visit.    History   Social History  . Marital Status: Married    Spouse Name: N/A    Number of Children: N/A  . Years of Education: N/A   Occupational History  . Not on file.   Social History Main Topics  . Smoking status: Former Smoker -- 0.25 packs/day for 18 years    Types: Cigarettes    Quit date: 11/03/1967  . Smokeless tobacco: Never Used  . Alcohol Use: No  . Drug Use: No  . Sexual Activity: Not Currently   Other Topics Concern  . Not on file   Social History Narrative  . No narrative on file  Family History  Problem Relation Age of Onset  . Diabetes type II    . Coronary artery disease Mother   . Lung cancer Brother   . Diabetes type II Brother        Vitals: Blood pressure 97/65, pulse 111, temperature 97.7 F (36.5 C), temperature source Axillary, resp. rate 18, height 5\' 4"  (1.626 m), weight 138 lb (62.596 kg).  Physical Exam: In general the patient is a pleasant elderly white female who initially is under considerable amount of pain associated with her perineum. Once she is rolled onto her side her pain is relieved.  HEENT is negative except that her left eyelid has been partially closed surgically.. Neck is supple without thyromegaly.  There is no supraclavicular or inguinal adenopathy.  The abdomen is soft nontender no masses organomegaly or ascites is noted. Colostomy stoma appears healthy.  Pelvic exam EGBUS  the packing is removed from the perineal defect and tissues appear healthy. Margins of the wound appear healthy and noninfected.  The painful area in the natal cleft that was identified last week has resolved. The dressing is removed and a new dressings soaked in saline as replaced.    Alvino Chapel, MD 06/05/2014, 8:55 AM                         Consult Note: Gyn-Onc   [name] 80 y.o. female  Chief Complaint  Patient presents with   . Vulvar Cancer    Follow up visit     Interval History:   HPI:  Allergies  Allergen Reactions  . Tenex [Guanfacine Hcl] Other (See Comments)    hypotension  . Crestor [Rosuvastatin] Other (See Comments)    Myalgia    Past Medical History  Diagnosis Date  . Hypertension   . Hyperlipidemia   . Obesity   . Hypothyroidism   . Vulvar cancer, carcinoma 11/03/2011  . Atrial fibrillation   . Cancer 2007    vulva-invasive well diff squamous cell ca  . CVA (cerebral infarction)   . Dyslipidemia   . History of radiation therapy 10/20/2013-12/01/2013    60 gray to perineum area and lower pelvis  . Diabetes mellitus 05-04-14    "Diabetic coma" 1 week ago -[facility] until pt. feeling improved and stable.  . Shortness of breath     occ. with coughing up phelgm usually mornings  . 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 Abnormal voice 05-04-14    "raspy voice" with inspiratory effort-Dr. Lissa Hoard aware 05-04-14  . Stroke 05/2000    right brain CVA pre H&P 2001-  . Anemia     Past Surgical History  Procedure Laterality Date  . Vulvar lesion removal  2012  . Breast surgery      Lumpectomy  . Dilation and curettage of uterus    . Cystourethroplasty / ureteroneocystostomy    . Radical vulvectomy    . Lymphadenectomy  2007    R inguinal femoral  . Radical vulvectomy  02/27/12  . Vulvar lesion removal  02/27/2012    Procedure: VULVAR LESION;  Surgeon: Alvino Chapel, MD;  Location: [facility];  Service: Gynecology;  Laterality: N/A;  . Right carotid endarterctomy    . Left eye surgery      , permanently stitched eye together  . Vulvectomy N/A 05/12/2014    Procedure: RADICAL VULVECTOMY WITH COLOSTOMY;  Surgeon: Alvino Chapel, MD;  Location: [facility];  Service: Gynecology;  Laterality: N/A;  . Laparotomy N/A 05/12/2014    Procedure: EXPLORATORY LAPAROTOMY, TOTAL ABDO MINAL HYSTERECTOMY AND BILATERAL SALPINGOOPHERECTOMY;  Surgeon: Alvino Chapel, MD;  Location: [facility];  Service: Gynecology;  Laterality: N/A;  . Esophagogastroduodenoscopy N/A 05/20/2014    Procedure: ESOPHAGOGASTRODUODENOSCOPY (EGD);  Surgeon: Jeryl Columbia, MD;  Location: [facility];  Service: Endoscopy;  Laterality: N/A;    Current Outpatient Prescriptions  Medication Sig Dispense Refill  . atorvastatin (LIPITOR) 20 MG tablet Take 1 tablet (20 mg total) by mouth daily.  30 tablet  3  . calcitRIOL (ROCALTROL) 0.25 MCG capsule Take 0.25 mcg by mouth every Monday, Wednesday, and Friday.      . diltiazem (DILACOR XR) 180 MG 24 hr capsule Take 180 mg by mouth every morning.      . enoxaparin (LOVENOX) 80 MG/0.8ML injection Inject 0.6 mLs (60 mg total) into the skin every 12 (twelve) hours.  22.4 Syringe  2  . furosemide (LASIX) 20 MG tablet Take 20 mg by mouth every morning.      . insulin detemir (LEVEMIR) 100 UNIT/ML injection Inject 16-22 Units into the skin 2 (two) times daily. 22 in am and 16  in pm      . insulin lispro (HUMALOG) 100 UNIT/ML injection Inject 14-22 Units into the skin 3 (three) times daily before meals. 14 in the morning 14 at lunch and 22 at supper      . levothyroxine (SYNTHROID, LEVOTHROID) 100 MCG tablet Take 100 mcg by mouth as directed. Every day except sunday      . metoprolol (LOPRESSOR) 50 MG tablet Take 50 mg by mouth 2 (two) times daily.      . ondansetron (ZOFRAN) 4 MG/2ML SOLN injection Inject 2 mLs (4 mg total) into the vein every 6 (six) hours as needed for nausea.  2 mL  0  . oxyCODONE-acetaminophen (PERCOCET) 5-325 MG per tablet Take 1-2 tablets by mouth every 6 (six) hours as needed for severe pain.  90 tablet  0  . pantoprazole (PROTONIX) 40 MG tablet Take 1 tablet (40 mg total) by mouth 2 (two) times daily before a meal.  60 tablet  3  . potassium chloride (K-DUR,KLOR-CON) 10 MEQ tablet Take 10 mEq by mouth daily.       No current facility-administered medications for this visit.    History   Social History  .  Marital Status: Married    Spouse Name: N/A    Number of Children: N/A  . Years of Education: N/A   Occupational History  . Not on file.   Social History Main Topics  . Smoking status: Former Smoker -- 0.25 packs/day for 18 years    Types: Cigarettes    Quit date: 11/03/1967  . Smokeless tobacco: Never Used  . Alcohol Use: No  . Drug Use: No  . Sexual Activity: Not Currently   Other Topics Concern  . Not on file   Social History Narrative  . No narrative on file    Family History  Problem Relation Age of Onset  . Diabetes type II    . Coronary artery disease Mother   . Lung cancer Brother   . Diabetes type II Brother     Review of Systems:  Vitals: Blood pressure 97/65, pulse 111, temperature 97.7 F (36.5 C), temperature source Axillary, resp. rate 18, height 5\' 4"  (1.626 m), weight 138 lb (62.596 kg).  Physical Exam:  Assessment/Plan:  Alvino Chapel, MD 06/05/2014, 8:55 AM                         Consult Note: Gyn-Onc   [name] 80 y.o. female  Chief Complaint  Patient presents with  . Vulvar Cancer    Follow up visit     Interval History: The patient returns today  last being seen in April 2013. Subsequent to that visit, she's had a number of medical problems including a stroke, atrial fibrillation, and some ophthalmologic problems. On direct questioning the patient does admit and her husband concurs that there is a new lesion on her vulva. She says it "stings". She has not had any bleeding.  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 At that time she underwent a modified radical vulvectomy and right inguinal lymphadenectomy. Lymph nodes and surgical margins were negative. She subsequently developed a recurrence on the left vulva in October 2008 underwent left modified radical vulvectomy. Given her age and overall poor performance status we did not perform an inguinal lymphadenectomy.   The patient subsequently had a third recurrence of October 2009 and this was excised and the rhomboid flap was used to fill the surgical defect. Surgical margins were again negative. Her most recent recurrences in September 2012 which is again excised.  She had a superficial wound separation which closed by secondary intent.      Review of System Ten point review of systems is negative except as noted above      Allergies  Allergen Reactions  . Tenex [Guanfacine Hcl] Other (See Comments)    hypotension  . Crestor [Rosuvastatin] Other (See Comments)    Myalgia    Past Medical History  Diagnosis Date  . Hypertension   . Hyperlipidemia   . Obesity   . Hypothyroidism   . Vulvar cancer, carcinoma 11/03/2011  . Atrial fibrillation   . Cancer 2007    vulva-invasive well diff squamous cell ca  . CVA (cerebral infarction)   . Dyslipidemia   . History of radiation therapy 10/20/2013-12/01/2013    60 gray to perineum area and lower pelvis  . Diabetes mellitus 05-04-14    "Diabetic coma" 1 week ago -[facility] until pt. feeling improved and stable.  . Shortness of breath     occ. with coughing up phelgm usually mornings  . 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 Abnormal voice 05-04-14    "raspy voice" with inspiratory effort-Dr. Lissa Hoard aware 05-04-14  . Stroke 05/2000    right brain CVA pre H&P 2001-  . Anemia     Past Surgical History  Procedure Laterality Date  . Vulvar lesion removal  2012  . Breast surgery      Lumpectomy  . Dilation and curettage of uterus    . Cystourethroplasty / ureteroneocystostomy    . Radical vulvectomy    . Lymphadenectomy  2007    R inguinal femoral  . Radical vulvectomy  02/27/12  . Vulvar lesion removal  02/27/2012    Procedure: VULVAR LESION;  Surgeon: Alvino Chapel, MD;  Location: [facility];  Service: Gynecology;  Laterality: N/A;  . Right carotid endarterctomy    . Left eye surgery      , permanently  stitched eye together  . Vulvectomy N/A 05/12/2014    Procedure: RADICAL VULVECTOMY WITH COLOSTOMY;  Surgeon: Alvino Chapel, MD;  Location: [facility];  Service: Gynecology;  Laterality: N/A;  . Laparotomy N/A 05/12/2014    Procedure: EXPLORATORY LAPAROTOMY, TOTAL ABDO MINAL HYSTERECTOMY AND BILATERAL SALPINGOOPHERECTOMY;  Surgeon: Alvino Chapel, MD;  Location: [facility];  Service: Gynecology;  Laterality: N/A;  . Esophagogastroduodenoscopy N/A 05/20/2014    Procedure: ESOPHAGOGASTRODUODENOSCOPY (EGD);  Surgeon: Jeryl Columbia, MD;  Location: [facility];  Service: Endoscopy;  Laterality: N/A;    Current Outpatient Prescriptions  Medication Sig Dispense Refill  . atorvastatin (LIPITOR) 20 MG tablet Take 1 tablet (20 mg total) by mouth daily.  30 tablet  3  . calcitRIOL (ROCALTROL) 0.25 MCG capsule Take 0.25 mcg by mouth every Monday, Wednesday, and Friday.      . diltiazem (DILACOR XR) 180 MG 24 hr capsule Take 180 mg by mouth every morning.      . enoxaparin (LOVENOX) 80 MG/0.8ML injection Inject 0.6 mLs (60 mg total) into the skin every 12 (twelve) hours.  22.4 Syringe  2  . furosemide (LASIX) 20 MG tablet Take 20 mg by mouth every morning.      . insulin detemir (LEVEMIR) 100 UNIT/ML injection Inject 16-22 Units into the skin 2 (two) times daily. 22 in am and 16  in pm      . insulin lispro (HUMALOG) 100 UNIT/ML injection Inject 14-22 Units into the skin 3 (three) times daily before meals. 14 in the morning 14 at lunch and 22 at supper      . levothyroxine (SYNTHROID, LEVOTHROID) 100 MCG tablet Take 100 mcg by mouth as directed. Every day except sunday      . metoprolol (LOPRESSOR) 50 MG tablet Take 50 mg by mouth 2 (two) times daily.      . ondansetron (ZOFRAN) 4 MG/2ML SOLN injection Inject 2 mLs (4 mg total) into the vein every 6 (six) hours as needed for nausea.  2 mL  0  . oxyCODONE-acetaminophen (PERCOCET) 5-325 MG per tablet Take 1-2 tablets by mouth every 6 (six) hours as  needed for severe pain.  90 tablet  0  . pantoprazole (PROTONIX) 40 MG tablet Take 1 tablet (40 mg total) by mouth 2 (two) times daily before a meal.  60 tablet  3  . potassium chloride (K-DUR,KLOR-CON) 10 MEQ tablet Take 10 mEq by mouth daily.       No current facility-administered medications for this visit.    History   Social History  . Marital Status: Married    Spouse Name: N/A    Number of Children: N/A  . Years of Education: N/A   Occupational History  . Not on file.   Social History Main Topics  . Smoking status: Former Smoker -- 0.25 packs/day for 18 years    Types: Cigarettes    Quit date: 11/03/1967  . Smokeless tobacco: Never Used  . Alcohol Use: No  . Drug Use: No  . Sexual Activity: Not Currently   Other Topics Concern  . Not on file   Social History Narrative  . No narrative on file    Family History  Problem Relation Age of Onset  . Diabetes type II    . Coronary artery disease Mother   . Lung cancer Brother   . Diabetes type II Brother        Vitals: Blood pressure 97/65, pulse 111, temperature 97.7 F (36.5 C), temperature source Axillary, resp. rate 18, height 5\' 4"  (1.626 m), weight 138 lb (62.596 kg).  Physical Exam: In general the patient is a pleasant elderly white female  no acute distress.  HEENT is negative except that her left eyelid has been partially closed surgically.. Neck is supple without thyromegaly.  There is no supraclavicular or inguinal adenopathy.  The abdomen is soft nontender no masses organomegaly or ascites is noted.  Pelvic exam EGBUS is status post radical vulvectomy with advancement flaps. She now has a new 4 cm fungating lesion just to the left of the anus. Assessment/Plan:recurrent vulvar cancer adjacent to the anus.   Given the location of this new lesion, I do not believe that surgery would be appropriate as the patient would require an extensive operation including resection of the anal sphincter colostomy.  Therefore would like to refer her to radiation oncology for consideration of external beam radiation therapy to this lesion. The patient and her husband are in agreement with this plan.   Alvino Chapel, MD 06/05/2014, 8:55 AM                         Consult Note: Gyn-Onc   [name] 80 y.o. female  Chief Complaint  Patient presents with  . Vulvar Cancer    Follow up visit     Interval History:   HPI:  Allergies  Allergen Reactions  . Tenex [Guanfacine Hcl] Other (See Comments)    hypotension  . Crestor [Rosuvastatin] Other (See Comments)    Myalgia    Past Medical History  Diagnosis Date  . Hypertension   . Hyperlipidemia   . Obesity   . Hypothyroidism   . Vulvar cancer, carcinoma 11/03/2011  . Atrial fibrillation   . Cancer 2007    vulva-invasive well diff squamous cell ca  . CVA (cerebral infarction)   . Dyslipidemia   . History of radiation therapy 10/20/2013-12/01/2013    60 gray to perineum area and lower pelvis  . Diabetes mellitus 05-04-14    "Diabetic coma" 1 week ago -[facility]. [facility] until pt. feeling improved and stable.  . Shortness of breath     occ. with coughing up phelgm usually mornings  . 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 Abnormal voice 05-04-14    "raspy voice" with inspiratory effort-Dr. Lissa Hoard aware 05-04-14  . Stroke 05/2000    right brain CVA pre H&P 2001-  . Anemia     Past Surgical History  Procedure Laterality Date  . Vulvar lesion removal  2012  . Breast surgery      Lumpectomy  . Dilation and curettage of uterus    . Cystourethroplasty / ureteroneocystostomy    . Radical vulvectomy    . Lymphadenectomy  2007    R inguinal femoral  . Radical vulvectomy  02/27/12  . Vulvar lesion removal  02/27/2012    Procedure: VULVAR LESION;  Surgeon: Alvino Chapel, MD;  Location: [facility];  Service: Gynecology;  Laterality: N/A;  . Right carotid endarterctomy    . Left eye  surgery      , permanently stitched eye together  . Vulvectomy N/A 05/12/2014    Procedure: RADICAL VULVECTOMY WITH COLOSTOMY;  Surgeon: Alvino Chapel, MD;  Location: [facility];  Service: Gynecology;  Laterality: N/A;  . Laparotomy N/A 05/12/2014    Procedure: EXPLORATORY LAPAROTOMY, TOTAL ABDO MINAL HYSTERECTOMY AND BILATERAL SALPINGOOPHERECTOMY;  Surgeon: Alvino Chapel, MD;  Location: [facility];  Service: Gynecology;  Laterality: N/A;  . Esophagogastroduodenoscopy N/A 05/20/2014    Procedure: ESOPHAGOGASTRODUODENOSCOPY (EGD);  Surgeon: Jeryl Columbia, MD;  Location: [facility];  Service: Endoscopy;  Laterality: N/A;    Current Outpatient Prescriptions  Medication Sig Dispense Refill  . atorvastatin (LIPITOR) 20 MG tablet Take 1 tablet (20 mg total) by mouth daily.  30 tablet  3  . calcitRIOL (ROCALTROL) 0.25 MCG capsule Take 0.25 mcg by mouth every Monday, Wednesday, and Friday.      . diltiazem (DILACOR XR) 180 MG 24 hr capsule Take 180 mg by mouth every morning.      . enoxaparin (LOVENOX) 80 MG/0.8ML injection Inject 0.6 mLs (60 mg total) into the skin every 12 (twelve) hours.  22.4 Syringe  2  . furosemide (LASIX) 20 MG tablet Take 20 mg by mouth every morning.      . insulin detemir (LEVEMIR) 100 UNIT/ML injection Inject 16-22 Units into the skin 2 (two) times daily. 22 in am and 16  in pm      . insulin lispro (HUMALOG) 100 UNIT/ML injection Inject 14-22 Units into the skin 3 (three) times daily before meals. 14 in the morning 14 at lunch and 22 at supper      . levothyroxine (SYNTHROID, LEVOTHROID) 100 MCG tablet Take 100 mcg by mouth as directed. Every day except sunday      . metoprolol (LOPRESSOR) 50 MG tablet Take 50 mg by mouth 2 (two) times daily.      . ondansetron (ZOFRAN) 4 MG/2ML SOLN injection Inject 2 mLs (4 mg total) into the vein every 6 (six) hours as needed for nausea.  2 mL  0  . oxyCODONE-acetaminophen (PERCOCET) 5-325 MG per tablet Take 1-2 tablets by  mouth every 6 (six) hours as needed for severe pain.  90 tablet  0  . pantoprazole (PROTONIX) 40 MG tablet Take 1 tablet (40 mg total) by mouth 2 (two) times daily before a meal.  60 tablet  3  . potassium chloride (K-DUR,KLOR-CON) 10 MEQ tablet Take 10 mEq by mouth daily.       No current facility-administered medications for this visit.    History   Social History  . Marital Status: Married    Spouse Name: N/A    Number of Children: N/A  . Years of Education: N/A   Occupational History  . Not on file.   Social History Main Topics  . Smoking status: Former Smoker -- 0.25 packs/day for 18 years    Types: Cigarettes    Quit date: 11/03/1967  . Smokeless tobacco: Never Used  . Alcohol Use: No  . Drug Use: No  . Sexual Activity: Not Currently   Other Topics Concern  . Not on file   Social History Narrative  . No narrative on file    Family History  Problem Relation Age of Onset  . Diabetes type II    . Coronary artery disease Mother   . Lung cancer Brother   . Diabetes type II Brother     Review of Systems:  Vitals: Blood pressure 97/65, pulse 111, temperature 97.7 F (36.5 C), temperature source Axillary, resp. rate 18, height 5\' 4"  (1.626 m), weight 138 lb (62.596 kg).  Physical Exam:  Assessment/Plan:   Alvino Chapel, MD 06/05/2014, 8:55 AM

## 2014-06-05 NOTE — Patient Instructions (Signed)
Follow up with Dr. Fermin Schwab on June 29,2015

## 2014-06-10 ENCOUNTER — Encounter: Payer: Self-pay | Admitting: Adult Health

## 2014-06-10 ENCOUNTER — Non-Acute Institutional Stay (SKILLED_NURSING_FACILITY): Payer: Medicare Other | Admitting: Adult Health

## 2014-06-10 DIAGNOSIS — E1165 Type 2 diabetes mellitus with hyperglycemia: Principal | ICD-10-CM

## 2014-06-10 DIAGNOSIS — E1129 Type 2 diabetes mellitus with other diabetic kidney complication: Secondary | ICD-10-CM

## 2014-06-10 NOTE — Progress Notes (Signed)
Patient ID: [name], female   DOB: [birth year], 80 y.o.   MRN: [id number]         PROGRESS NOTE  DATE: 06/10/2014  FACILITY:  [facility]  LEVEL OF CARE: SNF (31)  Acute Visit  CHIEF COMPLAINT:  Manage Hypoglycemia  HISTORY OF PRESENT ILLNESS: This is an 80 year old female who had an episode of hypoglycemia last night = 42. Patient was given 2 glucagon injections and blood sugar went up 115. No loss of consciousness. 5/15 hgbA1c 6.9  PAST MEDICAL HISTORY : Reviewed.  No changes/see problem list  CURRENT MEDICATIONS: Reviewed per MAR/see medication list  REVIEW OF SYSTEMS:  GENERAL: no change in appetite, no fatigue, no weight changes, no fever, chills or weakness RESPIRATORY: no cough, SOB, DOE,, wheezing, hemoptysis CARDIAC: no chest pain, edema or palpitations GI: no abdominal pain, diarrhea, constipation, heart burn, nausea or vomiting  PHYSICAL EXAMINATION  GENERAL: no acute distress, normal body habitus EYES: conjunctivae normal, sclerae normal, normal eye lids NECK: supple, trachea midline, no neck masses, no thyroid tenderness, no thyromegaly LYMPHATICS: no LAN in the neck, no supraclavicular LAN RESPIRATORY: breathing is even & unlabored, BS CTAB CARDIAC: RRR, no murmur,no extra heart sounds, no edema GI: abdomen soft, normal BS, no masses, no tenderness, no hepatomegaly, no splenomegaly PSYCHIATRIC: the patient is alert & oriented to person, affect & behavior appropriate  LABS/RADIOLOGY: Labs reviewed: Basic Metabolic Panel:  Recent Labs  05/18/14 0535  05/21/14 1830 05/22/14 0440 05/24/14 0514  NA 143  < > 140 139 139  K 3.2*  < > 4.2 4.6 4.3  CL 104  < > 102 102 102  CO2 29  < > 27 26 27   GLUCOSE 58*  < > 207* 221* 145*  BUN 29*  < > 18 16 16   CREATININE 1.26*  < > 1.33* 1.28* 1.32*  CALCIUM 8.0*  < > 8.0* 8.1* 8.5  MG 1.3*  --  1.5  --   --   PHOS 2.7  --   --   --   --   < > = values in this interval not displayed. Liver  Function Tests:  Recent Labs  05/04/14 1037 05/04/14 1415 05/16/14 0445  AST 20 17 15   ALT 14 14 10   ALKPHOS 61 75 61  BILITOT 1.2 0.8 0.8  PROT 6.9 7.3 4.9*  ALBUMIN 3.2* 3.2* 1.9*   CBC:  Recent Labs  09/24/13 1031 03/04/14 1019 05/04/14 1415  05/23/14 0423 05/24/14 0514 05/25/14 0354  WBC 7.8 8.2 8.2  < > 9.4 8.2 8.0  NEUTROABS 5.9 6.6 6.8  --   --   --   --   HGB 13.0 12.4 12.4  < > 10.2* 9.9* 9.8*  HCT 39.4 38.2 39.1  < > 31.4* 32.1* 31.8*  MCV 98.3 99.5 99.2  < > 96.3 97.0 97.5  PLT 157.0 190.0 187  < > 201 211 227  < > = values in this interval not displayed.  Lipid Panel:  Recent Labs  09/24/13 1031 11/24/13 1104 04/06/14 1615  HDL 31.10* 29.80* 28.70*   CBG:  Recent Labs  05/24/14 1703 05/24/14 2124 05/25/14 0729  GLUCAP 184* 220* 125*    ASSESSMENT/PLAN:  Diabetes Mellitus, type 2 - decrease Levemir to 18 units SQ Q AM and decrease Lispro to 16 units SQ Q supper.   CPT CODE: 74944  Seth Bake - NP [facility] [phone]

## 2014-06-18 ENCOUNTER — Encounter: Payer: Self-pay | Admitting: Cardiology

## 2014-06-18 ENCOUNTER — Ambulatory Visit (INDEPENDENT_AMBULATORY_CARE_PROVIDER_SITE_OTHER): Payer: Medicare Other | Admitting: Cardiology

## 2014-06-18 VITALS — BP 101/74 | HR 74 | Ht 64.0 in

## 2014-06-18 DIAGNOSIS — I482 Chronic atrial fibrillation, unspecified: Secondary | ICD-10-CM

## 2014-06-18 DIAGNOSIS — I6350 Cerebral infarction due to unspecified occlusion or stenosis of unspecified cerebral artery: Secondary | ICD-10-CM

## 2014-06-18 DIAGNOSIS — I4891 Unspecified atrial fibrillation: Secondary | ICD-10-CM

## 2014-06-18 NOTE — Progress Notes (Signed)
Patient ID: [name], female   DOB: 13-Jan-1926, 80 y.o.   MRN: [id number]    06/18/2014 [name]   [birth year]  [id number]  Primary Physicia Elayne Snare, MD Primary Cardiologist: Dr. Marlou Porch  The patient presents to clinic today for post hospital followup as well as followup of her atrial fibrillation.  HPI:  The patient is a frail appearing 79 year old female, followed by Dr. Marlou Porch, with a history of hypertension, hyperlipidemia, chronic atrial fibrillation on chronic anticoagulation and prior history of stroke. In the past, she had been anticoagulated with Coumadin, however most recently this was changed to Lovenox. She recently underwent a radical vulvectomy with colostomy, total abdominal hysterectomy and B/L salpingo opherectomy on 05/12/2014. Post surgically, she developed coffee ground emesis and anemia after Lovenox and Coumadin was restarted. EGD was performed by gastroenterology. There were findings of moderate hiatal hernia with distal esophageal ulcers and probable Barrett's with GE junction probable ulcerative mass-biopsied (negative for malignancy). During this hospitalization, we were consulted for recommendations regarding anticoagulation. At the time of her assessment, her atrial fibrillation was rate controlled. She had no evidence of active CHF. Her CHADS2-VASc  score was calculated at 5, including prior history of stroke. It was recommended that in the acute GIB setting, that holding full anticoagulation was in her best interest. However, we recommended restarting anticoagulation once her acute bleeding resolved. Prior discharge, her hemoglobin stabilized after transfusion and she was cleared by gastroenterology to restart Coumadin. However, this was discussed with GYN and they preferred use of Lovenox versus Coumadin. She was discharged to a skilled nursing facility. Since that time she has been continued on Lovenox every 12 hours. She has also been continued on rate control  medications for atrial fibrillation, including Cardizem and metoprolol.   She presents to clinic today for post hospital followup. She is accompanied by her husband. She has no major complaints, other than pain associated from her surgery. She denies any abnormal bleeding. She had she also denies any awareness of her age her fibrillation, including palpitations, shortness of breath, dizziness, chest discomfort. She also denies any signs of heart failure including orthopnea, PND and lower extremity edema.   Current Outpatient Prescriptions  Medication Sig Dispense Refill  . atorvastatin (LIPITOR) 20 MG tablet Take 1 tablet (20 mg total) by mouth daily.  30 tablet  3  . calcitRIOL (ROCALTROL) 0.25 MCG capsule Take 0.25 mcg by mouth every Monday, Wednesday, and Friday.      . diltiazem (DILACOR XR) 180 MG 24 hr capsule Take 180 mg by mouth every morning.      . enoxaparin (LOVENOX) 80 MG/0.8ML injection Inject 0.6 mLs (60 mg total) into the skin every 12 (twelve) hours.  22.4 Syringe  2  . furosemide (LASIX) 20 MG tablet Take 20 mg by mouth every morning.      . insulin detemir (LEVEMIR) 100 UNIT/ML injection Inject 22 units into the skin every morning and 16 units every evening.      . insulin lispro (HUMALOG) 100 UNIT/ML injection Inject 14-22 Units into the skin 3 (three) times daily before meals. Inject 14 units into the skin with breakfast, 14 units with lunch, and 22 units with supper.      . levothyroxine (SYNTHROID, LEVOTHROID) 100 MCG tablet Take 100 mcg by mouth as directed. Every day except sunday      . metoprolol (LOPRESSOR) 50 MG tablet Take 50 mg by mouth 2 (two) times daily.      [name]  oxyCODONE-acetaminophen (PERCOCET) 5-325 MG per tablet Take 1-2 tablets by mouth every 6 (six) hours as needed for severe pain.  90 tablet  0  . pantoprazole (PROTONIX) 40 MG tablet Take 1 tablet (40 mg total) by mouth 2 (two) times daily before a meal.  60 tablet  3  . potassium chloride (K-DUR,KLOR-CON) 10  MEQ tablet Take 10 mEq by mouth daily.       No current facility-administered medications for this visit.    Allergies  Allergen Reactions  . Tenex [Guanfacine Hcl] Other (See Comments)    hypotension  . Crestor [Rosuvastatin] Other (See Comments)    Myalgia    History   Social History  . Marital Status: Married    Spouse Name: N/A    Number of Children: N/A  . Years of Education: N/A   Occupational History  . Not on file.   Social History Main Topics  . Smoking status: Former Smoker -- 0.25 packs/day for 18 years    Types: Cigarettes    Quit date: 11/03/1967  . Smokeless tobacco: Never Used  . Alcohol Use: No  . Drug Use: No  . Sexual Activity: Not Currently   Other Topics Concern  . Not on file   Social History Narrative  . No narrative on file     Review of Systems: General: negative for chills, fever, night sweats or weight changes.  Cardiovascular: negative for chest pain, dyspnea on exertion, edema, orthopnea, palpitations, paroxysmal nocturnal dyspnea or shortness of breath Dermatological: negative for rash Respiratory: negative for cough or wheezing Urologic: negative for hematuria Abdominal: negative for nausea, vomiting, diarrhea, bright red blood per rectum, melena, or hematemesis Neurologic: negative for visual changes, syncope, or dizziness All other systems reviewed and are otherwise negative except as noted above.    Blood pressure 101/74, pulse 74, height 5\' 4"  (1.626 m), weight 0 lb (0 kg).  General appearance: alert, cooperative and no distress Neck: no carotid bruit and no JVD Lungs: clear to auscultation bilaterally Heart: regular rate and rhythm Extremities: no LEE Pulses: 2+ and symmetric Skin: warm and dry Neurologic: Grossly normal  EKG Atrial Fibrillation; HR 74 bpm  ASSESSMENT AND PLAN:   1. Chronic Atrial Fibrillation: Rate controlled in the 70s. She is asymptomatic and blood pressure stable. Continue Cardizem and metoprolol  for rate control. She is on Lovenox, per GYN, for anticoagulation. She is to continue Lovenox until cleared by GYN. If cleared by GYN, we may transition her back to Coumadin, as this is a more desirable option for the patient.   PLAN   Continue current plan of care as outlined above. She was last seen by Dr. Marlou Porch on 04/29/2014. At that time, he recommended that she followup with him in 6 months. She has been instructed to keep this appointment. She has been instructed to notify our office if she needs to be seen sooner.  [name] 06/18/2014 3:53 PM

## 2014-06-18 NOTE — Patient Instructions (Signed)
Follow up with Dr.Skains as directed

## 2014-06-22 ENCOUNTER — Ambulatory Visit: Payer: No Typology Code available for payment source | Attending: Gynecology | Admitting: Gynecology

## 2014-06-22 ENCOUNTER — Encounter: Payer: Self-pay | Admitting: Gynecology

## 2014-06-22 VITALS — BP 107/57 | HR 94 | Temp 98.0°F | Resp 20 | Ht 63.0 in | Wt 138.0 lb

## 2014-06-22 DIAGNOSIS — Z87891 Personal history of nicotine dependence: Secondary | ICD-10-CM | POA: Insufficient documentation

## 2014-06-22 DIAGNOSIS — Z794 Long term (current) use of insulin: Secondary | ICD-10-CM | POA: Insufficient documentation

## 2014-06-22 DIAGNOSIS — Z923 Personal history of irradiation: Secondary | ICD-10-CM | POA: Insufficient documentation

## 2014-06-22 DIAGNOSIS — E785 Hyperlipidemia, unspecified: Secondary | ICD-10-CM | POA: Diagnosis not present

## 2014-06-22 DIAGNOSIS — E119 Type 2 diabetes mellitus without complications: Secondary | ICD-10-CM | POA: Insufficient documentation

## 2014-06-22 DIAGNOSIS — Z8673 Personal history of transient ischemic attack (TIA), and cerebral infarction without residual deficits: Secondary | ICD-10-CM | POA: Insufficient documentation

## 2014-06-22 DIAGNOSIS — Z90710 Acquired absence of both cervix and uterus: Secondary | ICD-10-CM | POA: Diagnosis not present

## 2014-06-22 DIAGNOSIS — I10 Essential (primary) hypertension: Secondary | ICD-10-CM | POA: Insufficient documentation

## 2014-06-22 DIAGNOSIS — C519 Malignant neoplasm of vulva, unspecified: Secondary | ICD-10-CM

## 2014-06-22 DIAGNOSIS — I4891 Unspecified atrial fibrillation: Secondary | ICD-10-CM | POA: Insufficient documentation

## 2014-06-22 DIAGNOSIS — Z79899 Other long term (current) drug therapy: Secondary | ICD-10-CM | POA: Insufficient documentation

## 2014-06-22 DIAGNOSIS — E039 Hypothyroidism, unspecified: Secondary | ICD-10-CM | POA: Diagnosis not present

## 2014-06-22 NOTE — Patient Instructions (Signed)
Return to see me July 17.

## 2014-06-22 NOTE — Progress Notes (Signed)
Consult Note: Gyn-Onc   [name] 80 y.o. female  Chief Complaint  Patient presents with  . 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  Improved healing of vulvar defect. Improved pain control.  Plan: Patient will continue twice daily wound dressing changes. Increase physical therapy. Return to see me July 17/2015.   Interval History: Patient returns today for continuing followup.  She reports that the pain in her vulva is much improved and she only needs Tylenol.. She's been able to sit on a doughnut. Colostomy function appears to be normal. She's continuing physical therapy and making progress.. She denies any vaginal bleeding. She continues to have a Foley catheter in place. She denies any fever or chills. Appetite is good.  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 At that time she underwent a modified radical vulvectomy and right inguinal lymphadenectomy. Lymph nodes and surgical margins were negative. She subsequently developed a recurrence on the left vulva in October 2008 underwent left modified radical vulvectomy. Given her age and overall poor performance status we did not perform an inguinal lymphadenectomy.  The patient subsequently had a third recurrence of October 2009 and this was excised and the rhomboid flap was used to fill the surgical defect. Surgical margins were again negative. Her most recent recurrences in September 2012 which is again excised.  She had a superficial wound separation which closed by secondary intent.   She had yet another recurrence in November 2014 treated with external beam radiation therapy because of the lesion location very close to the anus. She had a partial response but then had progressive disease around the anus with severe pain. As a palliative maneuver our only option was to perform a posterior pelvic exenteration on 05/12/2014. She her initial  postoperative course was uncomplicated.     Review of System Ten point review of systems is negative except as noted above      Allergies  Allergen Reactions  . Tenex [Guanfacine Hcl] Other (See Comments)    hypotension  . Crestor [Rosuvastatin] Other (See Comments)    Myalgia    Past Medical History  Diagnosis Date  . Hypertension   . Hyperlipidemia   . Obesity   . Hypothyroidism   . Vulvar cancer, carcinoma 11/03/2011  . Atrial fibrillation   . Cancer 2007    vulva-invasive well diff squamous cell ca  . CVA (cerebral infarction)   . Dyslipidemia   . History of radiation therapy 10/20/2013-12/01/2013    60 gray to perineum area and lower pelvis  . Diabetes mellitus 05-04-14    "Diabetic coma" 1 week ago -[facility] until pt. feeling improved and stable.  . Shortness of breath     occ. with coughing up phelgm usually mornings  . 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 Abnormal voice 05-04-14    "raspy voice" with inspiratory effort-Dr. Lissa Hoard aware 05-04-14  . Stroke 05/2000    right brain CVA pre H&P 2001-  . Anemia     Past Surgical History  Procedure Laterality Date  . Vulvar lesion removal  2012  . Breast surgery      Lumpectomy  . Dilation and curettage of uterus    . Cystourethroplasty / ureteroneocystostomy    . Radical vulvectomy    . Lymphadenectomy  2007    R inguinal femoral  . Radical vulvectomy  02/27/12  . Vulvar lesion removal  02/27/2012    Procedure: VULVAR LESION;  Surgeon: Alvino Chapel, MD;  Location: [facility];  Service: Gynecology;  Laterality: N/A;  . Right carotid endarterctomy    . Left eye surgery      , permanently stitched eye together  . Vulvectomy N/A 05/12/2014    Procedure: RADICAL VULVECTOMY WITH COLOSTOMY;  Surgeon: Alvino Chapel, MD;  Location: [facility];  Service: Gynecology;  Laterality: N/A;  . Laparotomy N/A 05/12/2014    Procedure: EXPLORATORY LAPAROTOMY, TOTAL ABDO MINAL  HYSTERECTOMY AND BILATERAL SALPINGOOPHERECTOMY;  Surgeon: Alvino Chapel, MD;  Location: [facility];  Service: Gynecology;  Laterality: N/A;  . Esophagogastroduodenoscopy N/A 05/20/2014    Procedure: ESOPHAGOGASTRODUODENOSCOPY (EGD);  Surgeon: Jeryl Columbia, MD;  Location: [facility];  Service: Endoscopy;  Laterality: N/A;    Current Outpatient Prescriptions  Medication Sig Dispense Refill  . atorvastatin (LIPITOR) 20 MG tablet Take 1 tablet (20 mg total) by mouth daily.  30 tablet  3  . calcitRIOL (ROCALTROL) 0.25 MCG capsule Take 0.25 mcg by mouth every Monday, Wednesday, and Friday.      . diltiazem (DILACOR XR) 180 MG 24 hr capsule Take 180 mg by mouth every morning.      . enoxaparin (LOVENOX) 80 MG/0.8ML injection Inject 0.6 mLs (60 mg total) into the skin every 12 (twelve) hours.  22.4 Syringe  2  . furosemide (LASIX) 20 MG tablet Take 20 mg by mouth every morning.      . insulin detemir (LEVEMIR) 100 UNIT/ML injection Inject 22 units into the skin every morning and 16 units every evening.      . insulin lispro (HUMALOG) 100 UNIT/ML injection Inject 14-22 Units into the skin 3 (three) times daily before meals. Inject 14 units into the skin with breakfast, 14 units with lunch, and 22 units with supper.      . levothyroxine (SYNTHROID, LEVOTHROID) 100 MCG tablet Take 100 mcg by mouth as directed. Every day except sunday      . pantoprazole (PROTONIX) 40 MG tablet Take 1 tablet (40 mg total) by mouth 2 (two) times daily before a meal.  60 tablet  3  . potassium chloride (K-DUR,KLOR-CON) 10 MEQ tablet Take 10 mEq by mouth daily.      . metoprolol (LOPRESSOR) 50 MG tablet Take 50 mg by mouth 2 (two) times daily.      [name] oxyCODONE-acetaminophen (PERCOCET) 5-325 MG per tablet Take 1-2 tablets by mouth every 6 (six) hours as needed for severe pain.  90 tablet  0   No current facility-administered medications for this visit.    History   Social History  . Marital Status: Married     Spouse Name: N/A    Number of Children: N/A  . Years of Education: N/A   Occupational History  . Not on file.   Social History Main Topics  . Smoking status: Former Smoker -- 0.25 packs/day for 18 years    Types: Cigarettes    Quit date: 11/03/1967  . Smokeless tobacco: Never Used  . Alcohol Use: No  . Drug Use: No  . Sexual Activity: Not Currently   Other Topics Concern  . Not on file   Social History Narrative  . No narrative on file    Family History  Problem Relation Age of Onset  . Diabetes type II    . Coronary artery disease Mother   . Lung cancer Brother   . Diabetes type II  Brother        Vitals: Blood pressure 107/57, pulse 94, temperature 98 F (36.7 C), resp. rate 20, height 5\' 3"  (1.6 m), weight 138 lb (62.596 kg).  Physical Exam: In general the patient is a pleasant elderly white female who initially is under considerable amount of pain associated with her perineum. Once she is rolled onto her side her pain is relieved.  HEENT is negative except that her left eyelid has been partially closed surgically.. Neck is supple without thyromegaly.  There is no supraclavicular or inguinal adenopathy.  The abdomen is soft nontender no masses organomegaly or ascites is noted. Colostomy stoma appears healthy.  Pelvic exam EGBUS  the packing is removed from the perineal defect and tissues appear healthy. Margins of the wound appear healthy and noninfected.  The dressing is removed and a new dressings soaked in saline as replaced.    Alvino Chapel, MD 06/22/2014, 12:25 PM                         Consult Note: Gyn-Onc   [name] 80 y.o. female  Chief Complaint  Patient presents with  . Vulvar Cancer    Follow up     Interval History:   HPI:  Allergies  Allergen Reactions  . Tenex [Guanfacine Hcl] Other (See Comments)    hypotension  . Crestor [Rosuvastatin] Other (See Comments)    Myalgia    Past Medical  History  Diagnosis Date  . Hypertension   . Hyperlipidemia   . Obesity   . Hypothyroidism   . Vulvar cancer, carcinoma 11/03/2011  . Atrial fibrillation   . Cancer 2007    vulva-invasive well diff squamous cell ca  . CVA (cerebral infarction)   . Dyslipidemia   . History of radiation therapy 10/20/2013-12/01/2013    60 gray to perineum area and lower pelvis  . Diabetes mellitus 05-04-14    "Diabetic coma" 1 week ago -[facility] pt. feeling improved and stable.  . Shortness of breath     occ. with coughing up phelgm usually mornings  . 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 Abnormal voice 05-04-14    "raspy voice" with inspiratory effort-Dr. Lissa Hoard aware 05-04-14  . Stroke 05/2000    right brain CVA pre H&P 2001-  . Anemia     Past Surgical History  Procedure Laterality Date  . Vulvar lesion removal  2012  . Breast surgery      Lumpectomy  . Dilation and curettage of uterus    . Cystourethroplasty / ureteroneocystostomy    . Radical vulvectomy    . Lymphadenectomy  2007    R inguinal femoral  . Radical vulvectomy  02/27/12  . Vulvar lesion removal  02/27/2012    Procedure: VULVAR LESION;  Surgeon: Alvino Chapel, MD;  Location: [facility];  Service: Gynecology;  Laterality: N/A;  . Right carotid endarterctomy    . Left eye surgery      , permanently stitched eye together  . Vulvectomy N/A 05/12/2014    Procedure: RADICAL VULVECTOMY WITH COLOSTOMY;  Surgeon: Alvino Chapel, MD;  Location: [facility];  Service: Gynecology;  Laterality: N/A;  . Laparotomy N/A 05/12/2014    Procedure: EXPLORATORY LAPAROTOMY, TOTAL ABDO MINAL HYSTERECTOMY AND BILATERAL SALPINGOOPHERECTOMY;  Surgeon: Alvino Chapel, MD;  Location: [facility];  Service: Gynecology;  Laterality: N/A;  . Esophagogastroduodenoscopy N/A 05/20/2014  Procedure: ESOPHAGOGASTRODUODENOSCOPY (EGD);  Surgeon: Jeryl Columbia, MD;  Location: [facility];  Service: Endoscopy;   Laterality: N/A;    Current Outpatient Prescriptions  Medication Sig Dispense Refill  . atorvastatin (LIPITOR) 20 MG tablet Take 1 tablet (20 mg total) by mouth daily.  30 tablet  3  . calcitRIOL (ROCALTROL) 0.25 MCG capsule Take 0.25 mcg by mouth every Monday, Wednesday, and Friday.      . diltiazem (DILACOR XR) 180 MG 24 hr capsule Take 180 mg by mouth every morning.      . enoxaparin (LOVENOX) 80 MG/0.8ML injection Inject 0.6 mLs (60 mg total) into the skin every 12 (twelve) hours.  22.4 Syringe  2  . furosemide (LASIX) 20 MG tablet Take 20 mg by mouth every morning.      . insulin detemir (LEVEMIR) 100 UNIT/ML injection Inject 22 units into the skin every morning and 16 units every evening.      . insulin lispro (HUMALOG) 100 UNIT/ML injection Inject 14-22 Units into the skin 3 (three) times daily before meals. Inject 14 units into the skin with breakfast, 14 units with lunch, and 22 units with supper.      . levothyroxine (SYNTHROID, LEVOTHROID) 100 MCG tablet Take 100 mcg by mouth as directed. Every day except sunday      . pantoprazole (PROTONIX) 40 MG tablet Take 1 tablet (40 mg total) by mouth 2 (two) times daily before a meal.  60 tablet  3  . potassium chloride (K-DUR,KLOR-CON) 10 MEQ tablet Take 10 mEq by mouth daily.      . metoprolol (LOPRESSOR) 50 MG tablet Take 50 mg by mouth 2 (two) times daily.      [name] oxyCODONE-acetaminophen (PERCOCET) 5-325 MG per tablet Take 1-2 tablets by mouth every 6 (six) hours as needed for severe pain.  90 tablet  0   No current facility-administered medications for this visit.    History   Social History  . Marital Status: Married    Spouse Name: N/A    Number of Children: N/A  . Years of Education: N/A   Occupational History  . Not on file.   Social History Main Topics  . Smoking status: Former Smoker -- 0.25 packs/day for 18 years    Types: Cigarettes    Quit date: 11/03/1967  . Smokeless tobacco: Never Used  . Alcohol Use: No  .  Drug Use: No  . Sexual Activity: Not Currently   Other Topics Concern  . Not on file   Social History Narrative  . No narrative on file    Family History  Problem Relation Age of Onset  . Diabetes type II    . Coronary artery disease Mother   . Lung cancer Brother   . Diabetes type II Brother     Review of Systems:  Vitals: Blood pressure 107/57, pulse 94, temperature 98 F (36.7 C), resp. rate 20, height 5\' 3"  (1.6 m), weight 138 lb (62.596 kg).  Physical Exam:  Assessment/Plan:   Alvino Chapel, MD 06/22/2014, 12:25 PM                         Consult Note: Gyn-Onc   [name] 80 y.o. female  Chief Complaint  Patient presents with  . Vulvar Cancer    Follow up     Interval History: The patient returns today  last being seen in April 2013. Subsequent to that visit, she's had a number of  medical problems including a stroke, atrial fibrillation, and some ophthalmologic problems. On direct questioning the patient does admit and her husband concurs that there is a new lesion on her vulva. She says it "stings". She has not had any bleeding.  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 At that time she underwent a modified radical vulvectomy and right inguinal lymphadenectomy. Lymph nodes and surgical margins were negative. She subsequently developed a recurrence on the left vulva in October 2008 underwent left modified radical vulvectomy. Given her age and overall poor performance status we did not perform an inguinal lymphadenectomy.  The patient subsequently had a third recurrence of October 2009 and this was excised and the rhomboid flap was used to fill the surgical defect. Surgical margins were again negative. Her most recent recurrences in September 2012 which is again excised.  She had a superficial wound separation which closed by secondary intent.      Review of System Ten point review of systems  is negative except as noted above      Allergies  Allergen Reactions  . Tenex [Guanfacine Hcl] Other (See Comments)    hypotension  . Crestor [Rosuvastatin] Other (See Comments)    Myalgia    Past Medical History  Diagnosis Date  . Hypertension   . Hyperlipidemia   . Obesity   . Hypothyroidism   . Vulvar cancer, carcinoma 11/03/2011  . Atrial fibrillation   . Cancer 2007    vulva-invasive well diff squamous cell ca  . CVA (cerebral infarction)   . Dyslipidemia   . History of radiation therapy 10/20/2013-12/01/2013    60 gray to perineum area and lower pelvis  . Diabetes mellitus 05-04-14    "Diabetic coma" 1 week ago -[facility] until pt. feeling improved and stable.  . Shortness of breath     occ. with coughing up phelgm usually mornings  . 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 Abnormal voice 05-04-14    "raspy voice" with inspiratory effort-Dr. Lissa Hoard aware 05-04-14  . Stroke 05/2000    right brain CVA pre H&P 2001-  . Anemia     Past Surgical History  Procedure Laterality Date  . Vulvar lesion removal  2012  . Breast surgery      Lumpectomy  . Dilation and curettage of uterus    . Cystourethroplasty / ureteroneocystostomy    . Radical vulvectomy    . Lymphadenectomy  2007    R inguinal femoral  . Radical vulvectomy  02/27/12  . Vulvar lesion removal  02/27/2012    Procedure: VULVAR LESION;  Surgeon: Alvino Chapel, MD;  Location: [facility];  Service: Gynecology;  Laterality: N/A;  . Right carotid endarterctomy    . Left eye surgery      , permanently stitched eye together  . Vulvectomy N/A 05/12/2014    Procedure: RADICAL VULVECTOMY WITH COLOSTOMY;  Surgeon: Alvino Chapel, MD;  Location: [facility];  Service: Gynecology;  Laterality: N/A;  . Laparotomy N/A 05/12/2014    Procedure: EXPLORATORY LAPAROTOMY, TOTAL ABDO MINAL HYSTERECTOMY AND BILATERAL SALPINGOOPHERECTOMY;  Surgeon: Alvino Chapel, MD;  Location: [facility];  Service: Gynecology;  Laterality: N/A;  . Esophagogastroduodenoscopy N/A 05/20/2014    Procedure: ESOPHAGOGASTRODUODENOSCOPY (EGD);  Surgeon: Jeryl Columbia, MD;  Location: [facility];  Service: Endoscopy;  Laterality: N/A;    Current Outpatient Prescriptions  Medication Sig Dispense Refill  . atorvastatin (LIPITOR) 20 MG  tablet Take 1 tablet (20 mg total) by mouth daily.  30 tablet  3  . calcitRIOL (ROCALTROL) 0.25 MCG capsule Take 0.25 mcg by mouth every Monday, Wednesday, and Friday.      . diltiazem (DILACOR XR) 180 MG 24 hr capsule Take 180 mg by mouth every morning.      . enoxaparin (LOVENOX) 80 MG/0.8ML injection Inject 0.6 mLs (60 mg total) into the skin every 12 (twelve) hours.  22.4 Syringe  2  . furosemide (LASIX) 20 MG tablet Take 20 mg by mouth every morning.      . insulin detemir (LEVEMIR) 100 UNIT/ML injection Inject 22 units into the skin every morning and 16 units every evening.      . insulin lispro (HUMALOG) 100 UNIT/ML injection Inject 14-22 Units into the skin 3 (three) times daily before meals. Inject 14 units into the skin with breakfast, 14 units with lunch, and 22 units with supper.      . levothyroxine (SYNTHROID, LEVOTHROID) 100 MCG tablet Take 100 mcg by mouth as directed. Every day except sunday      . pantoprazole (PROTONIX) 40 MG tablet Take 1 tablet (40 mg total) by mouth 2 (two) times daily before a meal.  60 tablet  3  . potassium chloride (K-DUR,KLOR-CON) 10 MEQ tablet Take 10 mEq by mouth daily.      . metoprolol (LOPRESSOR) 50 MG tablet Take 50 mg by mouth 2 (two) times daily.      [name] oxyCODONE-acetaminophen (PERCOCET) 5-325 MG per tablet Take 1-2 tablets by mouth every 6 (six) hours as needed for severe pain.  90 tablet  0   No current facility-administered medications for this visit.    History   Social History  . Marital Status: Married    Spouse Name: N/A    Number of Children: N/A  . Years of Education: N/A   Occupational History  . Not  on file.   Social History Main Topics  . Smoking status: Former Smoker -- 0.25 packs/day for 18 years    Types: Cigarettes    Quit date: 11/03/1967  . Smokeless tobacco: Never Used  . Alcohol Use: No  . Drug Use: No  . Sexual Activity: Not Currently   Other Topics Concern  . Not on file   Social History Narrative  . No narrative on file    Family History  Problem Relation Age of Onset  . Diabetes type II    . Coronary artery disease Mother   . Lung cancer Brother   . Diabetes type II Brother        Vitals: Blood pressure 107/57, pulse 94, temperature 98 F (36.7 C), resp. rate 20, height 5\' 3"  (1.6 m), weight 138 lb (62.596 kg).  Physical Exam: In general the patient is a pleasant elderly white female no acute distress.  HEENT is negative except that her left eyelid has been partially closed surgically.. Neck is supple without thyromegaly.  There is no supraclavicular or inguinal adenopathy.  The abdomen is soft nontender no masses organomegaly or ascites is noted.  Pelvic exam EGBUS is status post radical vulvectomy with advancement flaps. She now has a new 4 cm fungating lesion just to the left of the anus. Assessment/Plan:recurrent vulvar cancer adjacent to the anus.   Given the location of this new lesion, I do not believe that surgery would be appropriate as the patient would require an extensive operation including resection of the anal sphincter colostomy. Therefore would like to  refer her to radiation oncology for consideration of external beam radiation therapy to this lesion. The patient and her husband are in agreement with this plan.   Alvino Chapel, MD 06/22/2014, 12:25 PM                         Consult Note: Gyn-Onc   [name] 80 y.o. female  Chief Complaint  Patient presents with  . Vulvar Cancer    Follow up     Interval History:   HPI:  Allergies  Allergen Reactions  . Tenex [Guanfacine Hcl] Other  (See Comments)    hypotension  . Crestor [Rosuvastatin] Other (See Comments)    Myalgia    Past Medical History  Diagnosis Date  . Hypertension   . Hyperlipidemia   . Obesity   . Hypothyroidism   . Vulvar cancer, carcinoma 11/03/2011  . Atrial fibrillation   . Cancer 2007    vulva-invasive well diff squamous cell ca  . CVA (cerebral infarction)   . Dyslipidemia   . History of radiation therapy 10/20/2013-12/01/2013    60 gray to perineum area and lower pelvis  . Diabetes mellitus 05-04-14    "Diabetic coma" 1 week ago -[facility] until pt. feeling improved and stable.  . Shortness of breath     occ. with coughing up phelgm usually mornings  . 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 Abnormal voice 05-04-14    "raspy voice" with inspiratory effort-Dr. Lissa Hoard aware 05-04-14  . Stroke 05/2000    right brain CVA pre H&P 2001-  . Anemia     Past Surgical History  Procedure Laterality Date  . Vulvar lesion removal  2012  . Breast surgery      Lumpectomy  . Dilation and curettage of uterus    . Cystourethroplasty / ureteroneocystostomy    . Radical vulvectomy    . Lymphadenectomy  2007    R inguinal femoral  . Radical vulvectomy  02/27/12  . Vulvar lesion removal  02/27/2012    Procedure: VULVAR LESION;  Surgeon: Alvino Chapel, MD;  Location: [facility];  Service: Gynecology;  Laterality: N/A;  . Right carotid endarterctomy    . Left eye surgery      , permanently stitched eye together  . Vulvectomy N/A 05/12/2014    Procedure: RADICAL VULVECTOMY WITH COLOSTOMY;  Surgeon: Alvino Chapel, MD;  Location: [facility];  Service: Gynecology;  Laterality: N/A;  . Laparotomy N/A 05/12/2014    Procedure: EXPLORATORY LAPAROTOMY, TOTAL ABDO MINAL HYSTERECTOMY AND BILATERAL SALPINGOOPHERECTOMY;  Surgeon: Alvino Chapel, MD;  Location: [facility];  Service: Gynecology;  Laterality: N/A;  . Esophagogastroduodenoscopy N/A 05/20/2014    Procedure:  ESOPHAGOGASTRODUODENOSCOPY (EGD);  Surgeon: Jeryl Columbia, MD;  Location: [facility];  Service: Endoscopy;  Laterality: N/A;    Current Outpatient Prescriptions  Medication Sig Dispense Refill  . atorvastatin (LIPITOR) 20 MG tablet Take 1 tablet (20 mg total) by mouth daily.  30 tablet  3  . calcitRIOL (ROCALTROL) 0.25 MCG capsule Take 0.25 mcg by mouth every Monday, Wednesday, and Friday.      . diltiazem (DILACOR XR) 180 MG 24 hr capsule Take 180 mg by mouth every morning.      . enoxaparin (LOVENOX) 80 MG/0.8ML injection Inject 0.6 mLs (60 mg total) into the skin every 12 (twelve) hours.  22.4 Syringe  2  . furosemide (LASIX) 20 MG tablet  Take 20 mg by mouth every morning.      . insulin detemir (LEVEMIR) 100 UNIT/ML injection Inject 22 units into the skin every morning and 16 units every evening.      . insulin lispro (HUMALOG) 100 UNIT/ML injection Inject 14-22 Units into the skin 3 (three) times daily before meals. Inject 14 units into the skin with breakfast, 14 units with lunch, and 22 units with supper.      . levothyroxine (SYNTHROID, LEVOTHROID) 100 MCG tablet Take 100 mcg by mouth as directed. Every day except sunday      . pantoprazole (PROTONIX) 40 MG tablet Take 1 tablet (40 mg total) by mouth 2 (two) times daily before a meal.  60 tablet  3  . potassium chloride (K-DUR,KLOR-CON) 10 MEQ tablet Take 10 mEq by mouth daily.      . metoprolol (LOPRESSOR) 50 MG tablet Take 50 mg by mouth 2 (two) times daily.      [name] oxyCODONE-acetaminophen (PERCOCET) 5-325 MG per tablet Take 1-2 tablets by mouth every 6 (six) hours as needed for severe pain.  90 tablet  0   No current facility-administered medications for this visit.    History   Social History  . Marital Status: Married    Spouse Name: N/A    Number of Children: N/A  . Years of Education: N/A   Occupational History  . Not on file.   Social History Main Topics  . Smoking status: Former Smoker -- 0.25 packs/day for 18 years     Types: Cigarettes    Quit date: 11/03/1967  . Smokeless tobacco: Never Used  . Alcohol Use: No  . Drug Use: No  . Sexual Activity: Not Currently   Other Topics Concern  . Not on file   Social History Narrative  . No narrative on file    Family History  Problem Relation Age of Onset  . Diabetes type II    . Coronary artery disease Mother   . Lung cancer Brother   . Diabetes type II Brother     Review of Systems:  Vitals: Blood pressure 107/57, pulse 94, temperature 98 F (36.7 C), resp. rate 20, height 5\' 3"  (1.6 m), weight 138 lb (62.596 kg).  Physical Exam:  Assessment/Plan:   Alvino Chapel, MD 06/22/2014, 12:25 PM

## 2014-06-23 ENCOUNTER — Telehealth: Payer: Self-pay | Admitting: *Deleted

## 2014-06-23 NOTE — Telephone Encounter (Signed)
A message was left on the scheduler of [facility] voice mail with details of pt's future appointment on 07/10/2014 with Dr. Fermin Schwab.

## 2014-06-23 NOTE — Telephone Encounter (Signed)
Received call from [place] at [facility] stating she had received the message concerning [name]'s future appointment with Dr. Fermin Schwab on 07/10/2014.

## 2014-07-01 ENCOUNTER — Non-Acute Institutional Stay (SKILLED_NURSING_FACILITY): Payer: Medicare Other | Admitting: Adult Health

## 2014-07-01 ENCOUNTER — Encounter: Payer: Self-pay | Admitting: Adult Health

## 2014-07-01 DIAGNOSIS — Z5189 Encounter for other specified aftercare: Secondary | ICD-10-CM

## 2014-07-01 DIAGNOSIS — IMO0001 Reserved for inherently not codable concepts without codable children: Secondary | ICD-10-CM

## 2014-07-01 DIAGNOSIS — T8149XA Infection following a procedure, other surgical site, initial encounter: Secondary | ICD-10-CM

## 2014-07-01 DIAGNOSIS — K2991 Gastroduodenitis, unspecified, with bleeding: Secondary | ICD-10-CM

## 2014-07-01 DIAGNOSIS — E039 Hypothyroidism, unspecified: Secondary | ICD-10-CM

## 2014-07-01 DIAGNOSIS — T8140XA Infection following a procedure, unspecified, initial encounter: Secondary | ICD-10-CM

## 2014-07-01 DIAGNOSIS — T814XXD Infection following a procedure, subsequent encounter: Secondary | ICD-10-CM

## 2014-07-01 DIAGNOSIS — E1165 Type 2 diabetes mellitus with hyperglycemia: Secondary | ICD-10-CM

## 2014-07-01 DIAGNOSIS — E1129 Type 2 diabetes mellitus with other diabetic kidney complication: Secondary | ICD-10-CM

## 2014-07-01 DIAGNOSIS — C519 Malignant neoplasm of vulva, unspecified: Secondary | ICD-10-CM

## 2014-07-01 DIAGNOSIS — I10 Essential (primary) hypertension: Secondary | ICD-10-CM

## 2014-07-01 DIAGNOSIS — K2971 Gastritis, unspecified, with bleeding: Secondary | ICD-10-CM

## 2014-07-01 DIAGNOSIS — I4891 Unspecified atrial fibrillation: Secondary | ICD-10-CM

## 2014-07-01 DIAGNOSIS — E785 Hyperlipidemia, unspecified: Secondary | ICD-10-CM

## 2014-07-01 HISTORY — DX: Infection following a procedure, other surgical site, initial encounter: T81.49XA

## 2014-07-01 NOTE — Progress Notes (Signed)
Patient ID: [name], female   DOB: 16-Jan-1926, 80 y.o.   MRN: [id number]         PROGRESS NOTE  DATE: 07/01/14  FACILITY:  [facility]  LEVEL OF CARE: SNF (31)  Routine Visit  CHIEF COMPLAINT:  Manage Diabetes Mellitus, Hypertension, Hyperlipidemia and Hypothyroidism  HISTORY OF PRESENT ILLNESS: This is an 80 year old female who has a vulvar cancer S/P left modified radical vulvectomy. Noted perineal wound with foul odor discharge. No fever reported.  RE-ASSESSMENT OF ON-GOING PROBLEMS:  HTN: Pt 's HTN remains stable.  Denies CP, sob, DOE, pedal edema, headaches, dizziness or visual disturbances.  No complications from the medications currently being used.  Last BP : 117/85  ATRIAL FIBRILLATION: the patients atrial fibrillation remains stable.  The patient denies DOE, tachycardia, orthopnea, transient neurological sx, pedal edema, palpitations, & PNDs.  No complications noted from the medications currently being used.  DM:pt's DM remains stable.  Pt denies polyuria, polydipsia, polyphagia, changes in vision or hypoglycemic episodes.  No complications noted from the medication presently being used.   5/15 hemoglobin A1c is: 6.9   PAST MEDICAL HISTORY : Reviewed.  No changes/see problem list  CURRENT MEDICATIONS: Reviewed per MAR/see medication list  REVIEW OF SYSTEMS:  GENERAL: no change in appetite, no fatigue, no weight changes, no fever, chills or weakness RESPIRATORY: no cough, SOB, DOE, wheezing, hemoptysis CARDIAC: no chest pain, edema or palpitations GI: no abdominal pain, diarrhea, constipation, heart burn, nausea or vomiting  PHYSICAL EXAMINATION  GENERAL: no acute distress, normal body habitus EYES: conjunctivae normal, sclerae normal, normal eye lids NECK: supple, trachea midline, no neck masses, no thyroid tenderness, no thyromegaly RESPIRATORY: breathing is even & unlabored, BS CTAB CARDIAC: RRR, no murmur,no extra heart sounds, no  edema GI: abdomen soft, normal BS, no masses, no tenderness, no hepatomegaly, no splenomegaly EXTREMITIES:  Able to move all 4 extremities PSYCHIATRIC: the patient is alert & oriented to person, affect & behavior appropriate  LABS/RADIOLOGY: Labs reviewed: Basic Metabolic Panel:  Recent Labs  05/18/14 0535  05/21/14 1830 05/22/14 0440 05/24/14 0514  NA 143  < > 140 139 139  K 3.2*  < > 4.2 4.6 4.3  CL 104  < > 102 102 102  CO2 29  < > 27 26 27   GLUCOSE 58*  < > 207* 221* 145*  BUN 29*  < > 18 16 16   CREATININE 1.26*  < > 1.33* 1.28* 1.32*  CALCIUM 8.0*  < > 8.0* 8.1* 8.5  MG 1.3*  --  1.5  --   --   PHOS 2.7  --   --   --   --   < > = values in this interval not displayed. Liver Function Tests:  Recent Labs  05/04/14 1037 05/04/14 1415 05/16/14 0445  AST 20 17 15   ALT 14 14 10   ALKPHOS 61 75 61  BILITOT 1.2 0.8 0.8  PROT 6.9 7.3 4.9*  ALBUMIN 3.2* 3.2* 1.9*   CBC:  Recent Labs  09/24/13 1031 03/04/14 1019 05/04/14 1415  05/23/14 0423 05/24/14 0514 05/25/14 0354  WBC 7.8 8.2 8.2  < > 9.4 8.2 8.0  NEUTROABS 5.9 6.6 6.8  --   --   --   --   HGB 13.0 12.4 12.4  < > 10.2* 9.9* 9.8*  HCT 39.4 38.2 39.1  < > 31.4* 32.1* 31.8*  MCV 98.3 99.5 99.2  < > 96.3 97.0 97.5  PLT 157.0 190.0 187  < >  201 211 227  < > = values in this interval not displayed.  Lipid Panel:  Recent Labs  09/24/13 1031 11/24/13 1104 04/06/14 1615  HDL 31.10* 29.80* 28.70*   CBG:  Recent Labs  05/24/14 1703 05/24/14 2124 05/25/14 0729  GLUCAP 184* 220* 125*    ASSESSMENT/PLAN:  Vulvar Cancer S/P left modified radical vulvectomy - followed-up by GYN-ON[facility] Vulvar wound infection - start on Doxycycline 100 mg 1 PO BID X 2 weeks Hyperlipidemia - continue Lipitor Diabetes Mellitus, type 2 - well-controlled; continue Levemir and Humalog Atrial Fibrillation - rate-controlled; continue Lopressor Hypothyroidism - continue Synthroid Hypertension - well-controlled; continue  Lopressor Hx of GI Bleed - continue Protonix   CPT CODE: 51833  Lorrene Graef Vargas - NP [place] [phone]

## 2014-07-08 ENCOUNTER — Other Ambulatory Visit: Payer: Self-pay | Admitting: *Deleted

## 2014-07-08 MED ORDER — OXYCODONE-ACETAMINOPHEN 5-325 MG PO TABS: ORAL_TABLET | ORAL | Status: DC

## 2014-07-08 NOTE — Telephone Encounter (Signed)
[facility] 

## 2014-07-10 ENCOUNTER — Encounter: Payer: Self-pay | Admitting: Gynecology

## 2014-07-10 ENCOUNTER — Ambulatory Visit: Payer: Medicare Other | Attending: Gynecology | Admitting: Gynecology

## 2014-07-10 VITALS — BP 101/71 | HR 95 | Temp 97.4°F | Resp 18 | Ht 63.0 in | Wt 130.0 lb

## 2014-07-10 DIAGNOSIS — E119 Type 2 diabetes mellitus without complications: Secondary | ICD-10-CM | POA: Insufficient documentation

## 2014-07-10 DIAGNOSIS — I4891 Unspecified atrial fibrillation: Secondary | ICD-10-CM | POA: Diagnosis not present

## 2014-07-10 DIAGNOSIS — Z923 Personal history of irradiation: Secondary | ICD-10-CM | POA: Diagnosis not present

## 2014-07-10 DIAGNOSIS — Z933 Colostomy status: Secondary | ICD-10-CM | POA: Insufficient documentation

## 2014-07-10 DIAGNOSIS — Z794 Long term (current) use of insulin: Secondary | ICD-10-CM | POA: Insufficient documentation

## 2014-07-10 DIAGNOSIS — Z8673 Personal history of transient ischemic attack (TIA), and cerebral infarction without residual deficits: Secondary | ICD-10-CM | POA: Insufficient documentation

## 2014-07-10 DIAGNOSIS — E039 Hypothyroidism, unspecified: Secondary | ICD-10-CM | POA: Insufficient documentation

## 2014-07-10 DIAGNOSIS — C519 Malignant neoplasm of vulva, unspecified: Secondary | ICD-10-CM | POA: Diagnosis not present

## 2014-07-10 DIAGNOSIS — E785 Hyperlipidemia, unspecified: Secondary | ICD-10-CM | POA: Diagnosis not present

## 2014-07-10 DIAGNOSIS — Z79899 Other long term (current) drug therapy: Secondary | ICD-10-CM | POA: Diagnosis not present

## 2014-07-10 DIAGNOSIS — I10 Essential (primary) hypertension: Secondary | ICD-10-CM | POA: Insufficient documentation

## 2014-07-10 NOTE — Patient Instructions (Signed)
You have an appt with Dr. Fermin Schwab on July 31

## 2014-07-10 NOTE — Progress Notes (Signed)
Consult Note: Gyn-Onc   [name] 80 y.o. female  Chief Complaint  Patient presents with  . 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  Improved healing of vulvar defect. Improved pain control.  Plan: Patient will continue twice daily wound dressing changes. Increase physical therapy. She is eager to return home and I think this would be reasonable once her time as expired at the rehabilitation facility. Her husband seems very able to pack her wound and we can increase our visits in the outpatient setting to monitor.  Foley catheter change today has not been changed since late May.  We've encouraged increasing physical therapy. Also to use Kariva paste around her colostomy. She returned to see me on July 31.   Interval History: Patient returns today for continuing followup.  She reports that the pain in her vulva is much improved and she only needs Tylenol.. She's been able to sit on a doughnut. Colostomy function appears to be normal although there r was some leaking. It appears that Kariva paste not being used with the colostomy appliance. She's continuing physical therapy and making slow progress.. She denies any vaginal bleeding. She continues to have a Foley catheter in place. She denies any fever or chills. Appetite is good.  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 At that time she underwent a modified radical vulvectomy and right inguinal lymphadenectomy. Lymph nodes and surgical margins were negative. She subsequently developed a recurrence on the left vulva in October 2008 underwent left modified radical vulvectomy. Given her age and overall poor performance status we did not perform an inguinal lymphadenectomy.  The patient subsequently had a third recurrence of October 2009 and this was excised and the rhomboid flap was used to fill the surgical defect. Surgical margins were again  negative. Her most recent recurrences in September 2012 which is again excised.  She had a superficial wound separation which closed by secondary intent.   She had yet another recurrence in November 2014 treated with external beam radiation therapy because of the lesion location very close to the anus. She had a partial response but then had progressive disease around the anus with severe pain. As a palliative maneuver our only option was to perform a posterior pelvic exenteration on 05/12/2014. She her initial postoperative course was uncomplicated.     Review of System Ten point review of systems is negative except as noted above      Allergies  Allergen Reactions  . Tenex [Guanfacine Hcl] Other (See Comments)    hypotension  . Crestor [Rosuvastatin] Other (See Comments)    Myalgia    Past Medical History  Diagnosis Date  . Hypertension   . Hyperlipidemia   . Obesity   . Hypothyroidism   . Vulvar cancer, carcinoma 11/03/2011  . Atrial fibrillation   . Cancer 2007    vulva-invasive well diff squamous cell ca  . CVA (cerebral infarction)   . Dyslipidemia   . History of radiation therapy 10/20/2013-12/01/2013    60 gray to perineum area and lower pelvis  . Diabetes mellitus 05-04-14    "Diabetic coma" 1 week ago -[facility] until pt. feeling improved and stable.  . Shortness of breath     occ. with coughing up phelgm usually mornings  . 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 Abnormal voice 05-04-14    "  raspy voice" with inspiratory effort-Dr. Lissa Hoard aware 05-04-14  . Stroke 05/2000    right brain CVA pre H&P 2001-  . Anemia     Past Surgical History  Procedure Laterality Date  . Vulvar lesion removal  2012  . Breast surgery      Lumpectomy  . Dilation and curettage of uterus    . Cystourethroplasty / ureteroneocystostomy    . Radical vulvectomy    . Lymphadenectomy  2007    R inguinal femoral  . Radical vulvectomy  02/27/12  . Vulvar  lesion removal  02/27/2012    Procedure: VULVAR LESION;  Surgeon: Alvino Chapel, MD;  Location: [facility];  Service: Gynecology;  Laterality: N/A;  . Right carotid endarterctomy    . Left eye surgery      , permanently stitched eye together  . Vulvectomy N/A 05/12/2014    Procedure: RADICAL VULVECTOMY WITH COLOSTOMY;  Surgeon: Alvino Chapel, MD;  Location: [facility];  Service: Gynecology;  Laterality: N/A;  . Laparotomy N/A 05/12/2014    Procedure: EXPLORATORY LAPAROTOMY, TOTAL ABDO MINAL HYSTERECTOMY AND BILATERAL SALPINGOOPHERECTOMY;  Surgeon: Alvino Chapel, MD;  Location: [facility];  Service: Gynecology;  Laterality: N/A;  . Esophagogastroduodenoscopy N/A 05/20/2014    Procedure: ESOPHAGOGASTRODUODENOSCOPY (EGD);  Surgeon: Jeryl Columbia, MD;  Location: [facility];  Service: Endoscopy;  Laterality: N/A;    Current Outpatient Prescriptions  Medication Sig Dispense Refill  . atorvastatin (LIPITOR) 20 MG tablet Take 1 tablet (20 mg total) by mouth daily.  30 tablet  3  . calcitRIOL (ROCALTROL) 0.25 MCG capsule Take 0.25 mcg by mouth every Monday, Wednesday, and Friday.      . diltiazem (DILACOR XR) 180 MG 24 hr capsule Take 180 mg by mouth every morning.      . enoxaparin (LOVENOX) 80 MG/0.8ML injection Inject 0.6 mLs (60 mg total) into the skin every 12 (twelve) hours.  22.4 Syringe  2  . furosemide (LASIX) 20 MG tablet Take 20 mg by mouth every morning.      . insulin detemir (LEVEMIR) 100 UNIT/ML injection Inject 22 units into the skin every morning and 16 units every evening.      . insulin lispro (HUMALOG) 100 UNIT/ML injection Inject 14-22 Units into the skin 3 (three) times daily before meals. Inject 14 units into the skin with breakfast, 14 units with lunch, and 22 units with supper.      . levothyroxine (SYNTHROID, LEVOTHROID) 100 MCG tablet Take 100 mcg by mouth as directed. Every day except sunday      . metoprolol (LOPRESSOR) 50 MG tablet Take 50 mg by mouth 2 (two)  times daily.      [name] oxyCODONE-acetaminophen (PERCOCET) 5-325 MG per tablet Take one tablet by mouth every 6 hours as needed for pain  120 tablet  0  . pantoprazole (PROTONIX) 40 MG tablet Take 1 tablet (40 mg total) by mouth 2 (two) times daily before a meal.  60 tablet  3  . potassium chloride (K-DUR,KLOR-CON) 10 MEQ tablet Take 10 mEq by mouth daily.       No current facility-administered medications for this visit.    History   Social History  . Marital Status: Married    Spouse Name: N/A    Number of Children: N/A  . Years of Education: N/A   Occupational History  . Not on file.   Social History Main Topics  . Smoking status: Former Smoker -- 0.25 packs/day for 18 years  Types: Cigarettes    Quit date: 11/03/1967  . Smokeless tobacco: Never Used  . Alcohol Use: No  . Drug Use: No  . Sexual Activity: Not Currently   Other Topics Concern  . Not on file   Social History Narrative  . No narrative on file    Family History  Problem Relation Age of Onset  . Diabetes type II    . Coronary artery disease Mother   . Lung cancer Brother   . Diabetes type II Brother        Vitals: Blood pressure 101/71, pulse 95, temperature 97.4 F (36.3 C), temperature source Oral, resp. rate 18, height 5\' 3"  (1.6 m), weight 130 lb (58.968 kg).  Physical Exam: In general the patient is a pleasant elderly white female who initially is under considerable amount of pain associated with her perineum. Once she is rolled onto her side her pain is relieved.  HEENT is negative except that her left eyelid has been partially closed surgically.. Neck is supple without thyromegaly.  There is no supraclavicular or inguinal adenopathy.  The abdomen is soft nontender no masses organomegaly or ascites is noted. Colostomy stoma appears healthy.  Pelvic exam EGBUS  the packing is removed from the perineal defect and tissues appear healthy. The defect was irrigated with half-strength peroxide and  slightly debrided bluntly. Margins of the wound appear healthy and noninfected.  The dressing is removed and a new dressings soaked in saline as replaced.    Alvino Chapel, MD 07/10/2014, 2:11 PM                         Consult Note: Gyn-Onc   [name] 80 y.o. female  Chief Complaint  Patient presents with  . Vulvar Cancer    Follow up     Interval History:   HPI:  Allergies  Allergen Reactions  . Tenex [Guanfacine Hcl] Other (See Comments)    hypotension  . Crestor [Rosuvastatin] Other (See Comments)    Myalgia    Past Medical History  Diagnosis Date  . Hypertension   . Hyperlipidemia   . Obesity   . Hypothyroidism   . Vulvar cancer, carcinoma 11/03/2011  . Atrial fibrillation   . Cancer 2007    vulva-invasive well diff squamous cell ca  . CVA (cerebral infarction)   . Dyslipidemia   . History of radiation therapy 10/20/2013-12/01/2013    60 gray to perineum area and lower pelvis  . Diabetes mellitus 05-04-14    "Diabetic coma" 1 week ago -[facility] pt. feeling improved and stable.  . Shortness of breath     occ. with coughing up phelgm usually mornings  . 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 Abnormal voice 05-04-14    "raspy voice" with inspiratory effort-Dr. Lissa Hoard aware 05-04-14  . Stroke 05/2000    right brain CVA pre H&P 2001-  . Anemia     Past Surgical History  Procedure Laterality Date  . Vulvar lesion removal  2012  . Breast surgery      Lumpectomy  . Dilation and curettage of uterus    . Cystourethroplasty / ureteroneocystostomy    . Radical vulvectomy    . Lymphadenectomy  2007    R inguinal femoral  . Radical vulvectomy  02/27/12  . Vulvar lesion removal  02/27/2012    Procedure: VULVAR LESION;  Surgeon: Alvino Chapel, MD;  Location:  [facility];  Service: Gynecology;  Laterality: N/A;  . Right carotid endarterctomy    . Left eye surgery      , permanently  stitched eye together  . Vulvectomy N/A 05/12/2014    Procedure: RADICAL VULVECTOMY WITH COLOSTOMY;  Surgeon: Alvino Chapel, MD;  Location: [facility];  Service: Gynecology;  Laterality: N/A;  . Laparotomy N/A 05/12/2014    Procedure: EXPLORATORY LAPAROTOMY, TOTAL ABDO MINAL HYSTERECTOMY AND BILATERAL SALPINGOOPHERECTOMY;  Surgeon: Alvino Chapel, MD;  Location: [facility];  Service: Gynecology;  Laterality: N/A;  . Esophagogastroduodenoscopy N/A 05/20/2014    Procedure: ESOPHAGOGASTRODUODENOSCOPY (EGD);  Surgeon: Jeryl Columbia, MD;  Location: [facility];  Service: Endoscopy;  Laterality: N/A;    Current Outpatient Prescriptions  Medication Sig Dispense Refill  . atorvastatin (LIPITOR) 20 MG tablet Take 1 tablet (20 mg total) by mouth daily.  30 tablet  3  . calcitRIOL (ROCALTROL) 0.25 MCG capsule Take 0.25 mcg by mouth every Monday, Wednesday, and Friday.      . diltiazem (DILACOR XR) 180 MG 24 hr capsule Take 180 mg by mouth every morning.      . enoxaparin (LOVENOX) 80 MG/0.8ML injection Inject 0.6 mLs (60 mg total) into the skin every 12 (twelve) hours.  22.4 Syringe  2  . furosemide (LASIX) 20 MG tablet Take 20 mg by mouth every morning.      . insulin detemir (LEVEMIR) 100 UNIT/ML injection Inject 22 units into the skin every morning and 16 units every evening.      . insulin lispro (HUMALOG) 100 UNIT/ML injection Inject 14-22 Units into the skin 3 (three) times daily before meals. Inject 14 units into the skin with breakfast, 14 units with lunch, and 22 units with supper.      . levothyroxine (SYNTHROID, LEVOTHROID) 100 MCG tablet Take 100 mcg by mouth as directed. Every day except sunday      . metoprolol (LOPRESSOR) 50 MG tablet Take 50 mg by mouth 2 (two) times daily.      [name] oxyCODONE-acetaminophen (PERCOCET) 5-325 MG per tablet Take one tablet by mouth every 6 hours as needed for pain  120 tablet  0  . pantoprazole (PROTONIX) 40 MG tablet Take 1 tablet (40 mg total) by mouth  2 (two) times daily before a meal.  60 tablet  3  . potassium chloride (K-DUR,KLOR-CON) 10 MEQ tablet Take 10 mEq by mouth daily.       No current facility-administered medications for this visit.    History   Social History  . Marital Status: Married    Spouse Name: N/A    Number of Children: N/A  . Years of Education: N/A   Occupational History  . Not on file.   Social History Main Topics  . Smoking status: Former Smoker -- 0.25 packs/day for 18 years    Types: Cigarettes    Quit date: 11/03/1967  . Smokeless tobacco: Never Used  . Alcohol Use: No  . Drug Use: No  . Sexual Activity: Not Currently   Other Topics Concern  . Not on file   Social History Narrative  . No narrative on file    Family History  Problem Relation Age of Onset  . Diabetes type II    . Coronary artery disease Mother   . Lung cancer Brother   . Diabetes type II Brother     Review of Systems:  Vitals: Blood pressure 101/71, pulse 95, temperature 97.4 F (36.3 C), temperature source Oral, resp.  rate 18, height 5\' 3"  (1.6 m), weight 130 lb (58.968 kg).  Physical Exam:  Assessment/Plan:   Alvino Chapel, MD 07/10/2014, 2:11 PM                         Consult Note: Gyn-Onc   [name] 80 y.o. female  Chief Complaint  Patient presents with  . Vulvar Cancer    Follow up     Interval History: The patient returns today  last being seen in April 2013. Subsequent to that visit, she's had a number of medical problems including a stroke, atrial fibrillation, and some ophthalmologic problems. On direct questioning the patient does admit and her husband concurs that there is a new lesion on her vulva. She says it "stings". She has not had any bleeding.  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 At that time she underwent a modified radical vulvectomy and right inguinal lymphadenectomy. Lymph nodes and surgical margins were  negative. She subsequently developed a recurrence on the left vulva in October 2008 underwent left modified radical vulvectomy. Given her age and overall poor performance status we did not perform an inguinal lymphadenectomy.  The patient subsequently had a third recurrence of October 2009 and this was excised and the rhomboid flap was used to fill the surgical defect. Surgical margins were again negative. Her most recent recurrences in September 2012 which is again excised.  She had a superficial wound separation which closed by secondary intent.      Review of System Ten point review of systems is negative except as noted above      Allergies  Allergen Reactions  . Tenex [Guanfacine Hcl] Other (See Comments)    hypotension  . Crestor [Rosuvastatin] Other (See Comments)    Myalgia    Past Medical History  Diagnosis Date  . Hypertension   . Hyperlipidemia   . Obesity   . Hypothyroidism   . Vulvar cancer, carcinoma 11/03/2011  . Atrial fibrillation   . Cancer 2007    vulva-invasive well diff squamous cell ca  . CVA (cerebral infarction)   . Dyslipidemia   . History of radiation therapy 10/20/2013-12/01/2013    60 gray to perineum area and lower pelvis  . Diabetes mellitus 05-04-14    "Diabetic coma" 1 week ago -[facility] until pt. feeling improved and stable.  . Shortness of breath     occ. with coughing up phelgm usually mornings  . 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 Abnormal voice 05-04-14    "raspy voice" with inspiratory effort-Dr. Lissa Hoard aware 05-04-14  . Stroke 05/2000    right brain CVA pre H&P 2001-  . Anemia     Past Surgical History  Procedure Laterality Date  . Vulvar lesion removal  2012  . Breast surgery      Lumpectomy  . Dilation and curettage of uterus    . Cystourethroplasty / ureteroneocystostomy    . Radical vulvectomy    . Lymphadenectomy  2007    R inguinal femoral  . Radical vulvectomy  02/27/12  . Vulvar lesion  removal  02/27/2012    Procedure: VULVAR LESION;  Surgeon: Alvino Chapel, MD;  Location: [facility];  Service: Gynecology;  Laterality: N/A;  . Right carotid endarterctomy    . Left eye surgery      , permanently stitched eye together  . Vulvectomy N/A 05/12/2014  Procedure: RADICAL VULVECTOMY WITH COLOSTOMY;  Surgeon: Alvino Chapel, MD;  Location: [facility];  Service: Gynecology;  Laterality: N/A;  . Laparotomy N/A 05/12/2014    Procedure: EXPLORATORY LAPAROTOMY, TOTAL ABDO MINAL HYSTERECTOMY AND BILATERAL SALPINGOOPHERECTOMY;  Surgeon: Alvino Chapel, MD;  Location: [facility];  Service: Gynecology;  Laterality: N/A;  . Esophagogastroduodenoscopy N/A 05/20/2014    Procedure: ESOPHAGOGASTRODUODENOSCOPY (EGD);  Surgeon: Jeryl Columbia, MD;  Location: [facility];  Service: Endoscopy;  Laterality: N/A;    Current Outpatient Prescriptions  Medication Sig Dispense Refill  . atorvastatin (LIPITOR) 20 MG tablet Take 1 tablet (20 mg total) by mouth daily.  30 tablet  3  . calcitRIOL (ROCALTROL) 0.25 MCG capsule Take 0.25 mcg by mouth every Monday, Wednesday, and Friday.      . diltiazem (DILACOR XR) 180 MG 24 hr capsule Take 180 mg by mouth every morning.      . enoxaparin (LOVENOX) 80 MG/0.8ML injection Inject 0.6 mLs (60 mg total) into the skin every 12 (twelve) hours.  22.4 Syringe  2  . furosemide (LASIX) 20 MG tablet Take 20 mg by mouth every morning.      . insulin detemir (LEVEMIR) 100 UNIT/ML injection Inject 22 units into the skin every morning and 16 units every evening.      . insulin lispro (HUMALOG) 100 UNIT/ML injection Inject 14-22 Units into the skin 3 (three) times daily before meals. Inject 14 units into the skin with breakfast, 14 units with lunch, and 22 units with supper.      . levothyroxine (SYNTHROID, LEVOTHROID) 100 MCG tablet Take 100 mcg by mouth as directed. Every day except sunday      . metoprolol (LOPRESSOR) 50 MG tablet Take 50 mg by mouth 2 (two) times  daily.      [name] oxyCODONE-acetaminophen (PERCOCET) 5-325 MG per tablet Take one tablet by mouth every 6 hours as needed for pain  120 tablet  0  . pantoprazole (PROTONIX) 40 MG tablet Take 1 tablet (40 mg total) by mouth 2 (two) times daily before a meal.  60 tablet  3  . potassium chloride (K-DUR,KLOR-CON) 10 MEQ tablet Take 10 mEq by mouth daily.       No current facility-administered medications for this visit.    History   Social History  . Marital Status: Married    Spouse Name: N/A    Number of Children: N/A  . Years of Education: N/A   Occupational History  . Not on file.   Social History Main Topics  . Smoking status: Former Smoker -- 0.25 packs/day for 18 years    Types: Cigarettes    Quit date: 11/03/1967  . Smokeless tobacco: Never Used  . Alcohol Use: No  . Drug Use: No  . Sexual Activity: Not Currently   Other Topics Concern  . Not on file   Social History Narrative  . No narrative on file    Family History  Problem Relation Age of Onset  . Diabetes type II    . Coronary artery disease Mother   . Lung cancer Brother   . Diabetes type II Brother        Vitals: Blood pressure 101/71, pulse 95, temperature 97.4 F (36.3 C), temperature source Oral, resp. rate 18, height 5\' 3"  (1.6 m), weight 130 lb (58.968 kg).  Physical Exam: In general the patient is a pleasant elderly white female no acute distress.  HEENT is negative except that her left eyelid has been partially  closed surgically.. Neck is supple without thyromegaly.  There is no supraclavicular or inguinal adenopathy.  The abdomen is soft nontender no masses organomegaly or ascites is noted.  Pelvic exam EGBUS is status post radical vulvectomy with advancement flaps. She now has a new 4 cm fungating lesion just to the left of the anus. Assessment/Plan:recurrent vulvar cancer adjacent to the anus.   Given the location of this new lesion, I do not believe that surgery would be appropriate as the  patient would require an extensive operation including resection of the anal sphincter colostomy. Therefore would like to refer her to radiation oncology for consideration of external beam radiation therapy to this lesion. The patient and her husband are in agreement with this plan.   Alvino Chapel, MD 07/10/2014, 2:11 PM                         Consult Note: Gyn-Onc   [name] 80 y.o. female  Chief Complaint  Patient presents with  . Vulvar Cancer    Follow up     Interval History:   HPI:  Allergies  Allergen Reactions  . Tenex [Guanfacine Hcl] Other (See Comments)    hypotension  . Crestor [Rosuvastatin] Other (See Comments)    Myalgia    Past Medical History  Diagnosis Date  . Hypertension   . Hyperlipidemia   . Obesity   . Hypothyroidism   . Vulvar cancer, carcinoma 11/03/2011  . Atrial fibrillation   . Cancer 2007    vulva-invasive well diff squamous cell ca  . CVA (cerebral infarction)   . Dyslipidemia   . History of radiation therapy 10/20/2013-12/01/2013    60 gray to perineum area and lower pelvis  . Diabetes mellitus 05-04-14    "Diabetic coma" 1 week ago -[facility] until pt. feeling improved and stable.  . Shortness of breath     occ. with coughing up phelgm usually mornings  . 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 Abnormal voice 05-04-14    "raspy voice" with inspiratory effort-Dr. Lissa Hoard aware 05-04-14  . Stroke 05/2000    right brain CVA pre H&P 2001-  . Anemia     Past Surgical History  Procedure Laterality Date  . Vulvar lesion removal  2012  . Breast surgery      Lumpectomy  . Dilation and curettage of uterus    . Cystourethroplasty / ureteroneocystostomy    . Radical vulvectomy    . Lymphadenectomy  2007    R inguinal femoral  . Radical vulvectomy  02/27/12  . Vulvar lesion removal  02/27/2012    Procedure: VULVAR LESION;  Surgeon: Alvino Chapel, MD;  Location: [facility];  Service: Gynecology;  Laterality: N/A;  . Right carotid endarterctomy    . Left eye surgery      , permanently stitched eye together  . Vulvectomy N/A 05/12/2014    Procedure: RADICAL VULVECTOMY WITH COLOSTOMY;  Surgeon: Alvino Chapel, MD;  Location: [facility];  Service: Gynecology;  Laterality: N/A;  . Laparotomy N/A 05/12/2014    Procedure: EXPLORATORY LAPAROTOMY, TOTAL ABDO MINAL HYSTERECTOMY AND BILATERAL SALPINGOOPHERECTOMY;  Surgeon: Alvino Chapel, MD;  Location: [facility];  Service: Gynecology;  Laterality: N/A;  . Esophagogastroduodenoscopy N/A 05/20/2014    Procedure: ESOPHAGOGASTRODUODENOSCOPY (EGD);  Surgeon: Jeryl Columbia, MD;  Location: [facility];  Service: Endoscopy;  Laterality: N/A;    Current Outpatient Prescriptions  Medication Sig Dispense Refill  . atorvastatin (LIPITOR) 20 MG tablet Take 1 tablet (20 mg total) by mouth daily.  30 tablet  3  . calcitRIOL (ROCALTROL) 0.25 MCG capsule Take 0.25 mcg by mouth every Monday, Wednesday, and Friday.      . diltiazem (DILACOR XR) 180 MG 24 hr capsule Take 180 mg by mouth every morning.      . enoxaparin (LOVENOX) 80 MG/0.8ML injection Inject 0.6 mLs (60 mg total) into the skin every 12 (twelve) hours.  22.4 Syringe  2  . furosemide (LASIX) 20 MG tablet Take 20 mg by mouth every morning.      . insulin detemir (LEVEMIR) 100 UNIT/ML injection Inject 22 units into the skin every morning and 16 units every evening.      . insulin lispro (HUMALOG) 100 UNIT/ML injection Inject 14-22 Units into the skin 3 (three) times daily before meals. Inject 14 units into the skin with breakfast, 14 units with lunch, and 22 units with supper.      . levothyroxine (SYNTHROID, LEVOTHROID) 100 MCG tablet Take 100 mcg by mouth as directed. Every day except sunday      . metoprolol (LOPRESSOR) 50 MG tablet Take 50 mg by mouth 2 (two) times daily.      [name] oxyCODONE-acetaminophen (PERCOCET) 5-325 MG per tablet Take one tablet by mouth every  6 hours as needed for pain  120 tablet  0  . pantoprazole (PROTONIX) 40 MG tablet Take 1 tablet (40 mg total) by mouth 2 (two) times daily before a meal.  60 tablet  3  . potassium chloride (K-DUR,KLOR-CON) 10 MEQ tablet Take 10 mEq by mouth daily.       No current facility-administered medications for this visit.    History   Social History  . Marital Status: Married    Spouse Name: N/A    Number of Children: N/A  . Years of Education: N/A   Occupational History  . Not on file.   Social History Main Topics  . Smoking status: Former Smoker -- 0.25 packs/day for 18 years    Types: Cigarettes    Quit date: 11/03/1967  . Smokeless tobacco: Never Used  . Alcohol Use: No  . Drug Use: No  . Sexual Activity: Not Currently   Other Topics Concern  . Not on file   Social History Narrative  . No narrative on file    Family History  Problem Relation Age of Onset  . Diabetes type II    . Coronary artery disease Mother   . Lung cancer Brother   . Diabetes type II Brother     Review of Systems:  Vitals: Blood pressure 101/71, pulse 95, temperature 97.4 F (36.3 C), temperature source Oral, resp. rate 18, height 5\' 3"  (1.6 m), weight 130 lb (58.968 kg).  Physical Exam:  Assessment/Plan:   Alvino Chapel, MD 07/10/2014, 2:11 PM

## 2014-07-24 ENCOUNTER — Encounter: Payer: Self-pay | Admitting: Gynecology

## 2014-07-24 ENCOUNTER — Ambulatory Visit: Payer: Medicare Other | Attending: Gynecology | Admitting: Gynecology

## 2014-07-24 VITALS — BP 129/81 | HR 74 | Temp 97.6°F | Resp 16 | Ht 63.0 in

## 2014-07-24 DIAGNOSIS — Z8673 Personal history of transient ischemic attack (TIA), and cerebral infarction without residual deficits: Secondary | ICD-10-CM | POA: Diagnosis not present

## 2014-07-24 DIAGNOSIS — I4891 Unspecified atrial fibrillation: Secondary | ICD-10-CM | POA: Insufficient documentation

## 2014-07-24 DIAGNOSIS — I10 Essential (primary) hypertension: Secondary | ICD-10-CM | POA: Insufficient documentation

## 2014-07-24 DIAGNOSIS — C519 Malignant neoplasm of vulva, unspecified: Secondary | ICD-10-CM | POA: Diagnosis not present

## 2014-07-24 DIAGNOSIS — Z79899 Other long term (current) drug therapy: Secondary | ICD-10-CM | POA: Insufficient documentation

## 2014-07-24 DIAGNOSIS — Z933 Colostomy status: Secondary | ICD-10-CM

## 2014-07-24 DIAGNOSIS — Z87891 Personal history of nicotine dependence: Secondary | ICD-10-CM | POA: Insufficient documentation

## 2014-07-24 DIAGNOSIS — E119 Type 2 diabetes mellitus without complications: Secondary | ICD-10-CM | POA: Diagnosis not present

## 2014-07-24 DIAGNOSIS — E039 Hypothyroidism, unspecified: Secondary | ICD-10-CM | POA: Diagnosis not present

## 2014-07-24 DIAGNOSIS — E785 Hyperlipidemia, unspecified: Secondary | ICD-10-CM | POA: Insufficient documentation

## 2014-07-24 NOTE — Progress Notes (Signed)
Consult Note: Gyn-Onc   [name] 80 y.o. female  No chief complaint on file. 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  Improved healing of vulvar defect. Improved pain control. Improved functional status.  Plan: Patient will continue twice daily wound dressing changes. Increase physical therapy. She is eager to return home and I think this would be reasonable once her time as expired at the rehabilitation facility. Her husband seems very able to pack her wound and we can increase our visits in the outpatient setting to monitor.  We've encouraged increasing physical therapy. Also to use Kariva paste around her colostomy. She returned to see me on August 14.  Once the patient is home, we will remove her Foley catheter and see whether she is able to void spontaneously (we will conduct a trial in the clinic at her visit). The patient's husband will arrange to have a Foley leg bag.  She will continue using Lovenox while in the nursing home.   Interval History: Patient returns today for continuing followup.  She reports that the pain in her vulva is much improved and she only needs Tylenol.. She's been able to sit on a doughnut. Colostomy function appears to be normal and the seal is much better now that Kariva paste is being used with the colostomy appliance. She's continuing physical therapy and making slow progress. In fact she says that with a walker she would be able to walk from our clinic to the front door of the cancer center today... She denies any vaginal bleeding. She continues to have a Foley catheter in place. She denies any fever or chills. Appetite is good.  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 At that time she underwent a modified radical vulvectomy and right inguinal lymphadenectomy. Lymph nodes and surgical margins were negative. She subsequently developed a recurrence on the left vulva in October 2008  underwent left modified radical vulvectomy. Given her age and overall poor performance status we did not perform an inguinal lymphadenectomy.  The patient subsequently had a third recurrence of October 2009 and this was excised and the rhomboid flap was used to fill the surgical defect. Surgical margins were again negative. Her most recent recurrences in September 2012 which is again excised.  She had a superficial wound separation which closed by secondary intent.   She had yet another recurrence in November 2014 treated with external beam radiation therapy because of the lesion location very close to the anus. She had a partial response but then had progressive disease around the anus with severe pain. As a palliative maneuver our only option was to perform a posterior pelvic exenteration on 05/12/2014. She her initial postoperative course was uncomplicated.     Review of System Ten point review of systems is negative except as noted above      Allergies  Allergen Reactions  . Tenex [Guanfacine Hcl] Other (See Comments)    hypotension  . Crestor [Rosuvastatin] Other (See Comments)    Myalgia    Past Medical History  Diagnosis Date  . Hypertension   . Hyperlipidemia   . Obesity   . Hypothyroidism   . Vulvar cancer, carcinoma 11/03/2011  . Atrial fibrillation   . Cancer 2007    vulva-invasive well diff squamous cell ca  . CVA (cerebral infarction)   . Dyslipidemia   . History of radiation therapy 10/20/2013-12/01/2013    60 gray to perineum area and lower pelvis  .  Diabetes mellitus 05-04-14    "Diabetic coma" 1 week ago -tx. by EMT At residence until pt. feeling improved and stable.  . Shortness of breath     occ. with coughing up phelgm usually mornings  . 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 Abnormal voice 05-04-14    "raspy voice" with inspiratory effort-Dr. Lissa Hoard aware 05-04-14  . Stroke 05/2000    right brain CVA pre H&P 2001-  . Anemia     Past  Surgical History  Procedure Laterality Date  . Vulvar lesion removal  2012  . Breast surgery      Lumpectomy  . Dilation and curettage of uterus    . Cystourethroplasty / ureteroneocystostomy    . Radical vulvectomy    . Lymphadenectomy  2007    R inguinal femoral  . Radical vulvectomy  02/27/12  . Vulvar lesion removal  02/27/2012    Procedure: VULVAR LESION;  Surgeon: Alvino Chapel, MD;  Location: [facility];  Service: Gynecology;  Laterality: N/A;  . Right carotid endarterctomy    . Left eye surgery      , permanently stitched eye together  . Vulvectomy N/A 05/12/2014    Procedure: RADICAL VULVECTOMY WITH COLOSTOMY;  Surgeon: Alvino Chapel, MD;  Location: [facility];  Service: Gynecology;  Laterality: N/A;  . Laparotomy N/A 05/12/2014    Procedure: EXPLORATORY LAPAROTOMY, TOTAL ABDO MINAL HYSTERECTOMY AND BILATERAL SALPINGOOPHERECTOMY;  Surgeon: Alvino Chapel, MD;  Location: [facility];  Service: Gynecology;  Laterality: N/A;  . Esophagogastroduodenoscopy N/A 05/20/2014    Procedure: ESOPHAGOGASTRODUODENOSCOPY (EGD);  Surgeon: Jeryl Columbia, MD;  Location: [facility];  Service: Endoscopy;  Laterality: N/A;    Current Outpatient Prescriptions  Medication Sig Dispense Refill  . atorvastatin (LIPITOR) 20 MG tablet Take 1 tablet (20 mg total) by mouth daily.  30 tablet  3  . calcitRIOL (ROCALTROL) 0.25 MCG capsule Take 0.25 mcg by mouth every Monday, Wednesday, and Friday.      . diltiazem (DILACOR XR) 180 MG 24 hr capsule Take 180 mg by mouth every morning.      . enoxaparin (LOVENOX) 80 MG/0.8ML injection Inject 0.6 mLs (60 mg total) into the skin every 12 (twelve) hours.  22.4 Syringe  2  . furosemide (LASIX) 20 MG tablet Take 20 mg by mouth every morning.      . insulin detemir (LEVEMIR) 100 UNIT/ML injection Inject 22 units into the skin every morning and 16 units every evening.      . insulin lispro (HUMALOG) 100 UNIT/ML injection Inject 14-22 Units into the skin 3  (three) times daily before meals. Inject 14 units into the skin with breakfast, 14 units with lunch, and 22 units with supper.      . levothyroxine (SYNTHROID, LEVOTHROID) 100 MCG tablet Take 100 mcg by mouth as directed. Every day except sunday      . metoprolol (LOPRESSOR) 50 MG tablet Take 50 mg by mouth 2 (two) times daily.      [name] oxyCODONE-acetaminophen (PERCOCET) 5-325 MG per tablet Take one tablet by mouth every 6 hours as needed for pain  120 tablet  0  . pantoprazole (PROTONIX) 40 MG tablet Take 1 tablet (40 mg total) by mouth 2 (two) times daily before a meal.  60 tablet  3  . potassium chloride (K-DUR,KLOR-CON) 10 MEQ tablet Take 10 mEq by mouth daily.       No current facility-administered medications for this visit.  History   Social History  . Marital Status: Married    Spouse Name: N/A    Number of Children: N/A  . Years of Education: N/A   Occupational History  . Not on file.   Social History Main Topics  . Smoking status: Former Smoker -- 0.25 packs/day for 18 years    Types: Cigarettes    Quit date: 11/03/1967  . Smokeless tobacco: Never Used  . Alcohol Use: No  . Drug Use: No  . Sexual Activity: Not Currently   Other Topics Concern  . Not on file   Social History Narrative  . No narrative on file    Family History  Problem Relation Age of Onset  . Diabetes type II    . Coronary artery disease Mother   . Lung cancer Brother   . Diabetes type II Brother        Vitals: Blood pressure 129/81, pulse 74, temperature 97.6 F (36.4 C), resp. rate 16, height 5\' 3"  (1.6 m), weight 0 lb (0 kg).  Physical Exam: In general the patient is a pleasant elderly white female who initially is under considerable amount of pain associated with her perineum. Once she is rolled onto her side her pain is relieved.  HEENT is negative except that her left eyelid has been partially closed surgically.. Neck is supple without thyromegaly.  There is no supraclavicular or  inguinal adenopathy.  The abdomen is soft nontender no masses organomegaly or ascites is noted. Colostomy stoma appears healthy.  Pelvic exam EGBUS a Foley catheter is in place.  the packing is removed from the perineal defect and tissues appear healthy and is much smaller. There is more granulation tissue today. The defect was irrigated with half-strength peroxide and slightly debrided bluntly. Margins of the wound appear healthy and noninfected.  The dressing is removed and a new dressings soaked in saline as replaced.    [name],Julissa Browning L, MD 07/24/2014, 11:24 AM

## 2014-07-24 NOTE — Patient Instructions (Signed)
Continue to increase her activity using a walker. We will plan on seeing her back again on August 14. At that time we may try to remove her Foley catheter and see if you're bladder well empty on its own.

## 2014-08-06 ENCOUNTER — Non-Acute Institutional Stay (SKILLED_NURSING_FACILITY): Payer: Medicare Other | Admitting: Adult Health

## 2014-08-06 ENCOUNTER — Other Ambulatory Visit: Payer: Self-pay | Admitting: *Deleted

## 2014-08-06 ENCOUNTER — Encounter: Payer: Self-pay | Admitting: Adult Health

## 2014-08-06 DIAGNOSIS — I4891 Unspecified atrial fibrillation: Secondary | ICD-10-CM

## 2014-08-06 DIAGNOSIS — I10 Essential (primary) hypertension: Secondary | ICD-10-CM

## 2014-08-06 DIAGNOSIS — E1129 Type 2 diabetes mellitus with other diabetic kidney complication: Secondary | ICD-10-CM

## 2014-08-06 DIAGNOSIS — K2971 Gastritis, unspecified, with bleeding: Secondary | ICD-10-CM

## 2014-08-06 DIAGNOSIS — E1165 Type 2 diabetes mellitus with hyperglycemia: Secondary | ICD-10-CM

## 2014-08-06 DIAGNOSIS — C519 Malignant neoplasm of vulva, unspecified: Secondary | ICD-10-CM

## 2014-08-06 DIAGNOSIS — K2991 Gastroduodenitis, unspecified, with bleeding: Secondary | ICD-10-CM

## 2014-08-06 DIAGNOSIS — E039 Hypothyroidism, unspecified: Secondary | ICD-10-CM

## 2014-08-06 DIAGNOSIS — E785 Hyperlipidemia, unspecified: Secondary | ICD-10-CM

## 2014-08-06 MED ORDER — TRAMADOL HCL 50 MG PO TABS: ORAL_TABLET | ORAL | Status: DC

## 2014-08-07 ENCOUNTER — Encounter: Payer: Self-pay | Admitting: Adult Health

## 2014-08-07 ENCOUNTER — Ambulatory Visit: Payer: Medicare Other | Attending: Gynecology | Admitting: Gynecology

## 2014-08-07 ENCOUNTER — Encounter: Payer: Self-pay | Admitting: Gynecology

## 2014-08-07 DIAGNOSIS — Z888 Allergy status to other drugs, medicaments and biological substances status: Secondary | ICD-10-CM | POA: Insufficient documentation

## 2014-08-07 DIAGNOSIS — Z87891 Personal history of nicotine dependence: Secondary | ICD-10-CM | POA: Diagnosis not present

## 2014-08-07 DIAGNOSIS — H5440 Blindness, one eye, unspecified eye: Secondary | ICD-10-CM | POA: Diagnosis not present

## 2014-08-07 DIAGNOSIS — E785 Hyperlipidemia, unspecified: Secondary | ICD-10-CM | POA: Diagnosis not present

## 2014-08-07 DIAGNOSIS — C519 Malignant neoplasm of vulva, unspecified: Secondary | ICD-10-CM

## 2014-08-07 DIAGNOSIS — E669 Obesity, unspecified: Secondary | ICD-10-CM | POA: Diagnosis not present

## 2014-08-07 DIAGNOSIS — Z8544 Personal history of malignant neoplasm of other female genital organs: Secondary | ICD-10-CM | POA: Insufficient documentation

## 2014-08-07 DIAGNOSIS — Z933 Colostomy status: Secondary | ICD-10-CM | POA: Insufficient documentation

## 2014-08-07 DIAGNOSIS — I4891 Unspecified atrial fibrillation: Secondary | ICD-10-CM | POA: Insufficient documentation

## 2014-08-07 DIAGNOSIS — E039 Hypothyroidism, unspecified: Secondary | ICD-10-CM | POA: Diagnosis not present

## 2014-08-07 DIAGNOSIS — I10 Essential (primary) hypertension: Secondary | ICD-10-CM | POA: Diagnosis not present

## 2014-08-07 DIAGNOSIS — Z79899 Other long term (current) drug therapy: Secondary | ICD-10-CM | POA: Diagnosis not present

## 2014-08-07 DIAGNOSIS — E119 Type 2 diabetes mellitus without complications: Secondary | ICD-10-CM | POA: Diagnosis not present

## 2014-08-07 DIAGNOSIS — Z794 Long term (current) use of insulin: Secondary | ICD-10-CM | POA: Insufficient documentation

## 2014-08-07 DIAGNOSIS — Z8673 Personal history of transient ischemic attack (TIA), and cerebral infarction without residual deficits: Secondary | ICD-10-CM | POA: Insufficient documentation

## 2014-08-07 DIAGNOSIS — D649 Anemia, unspecified: Secondary | ICD-10-CM | POA: Insufficient documentation

## 2014-08-07 NOTE — Patient Instructions (Signed)
Plan to follow up with Dr. Denman George in three weeks.  Call for any questions or concerns.

## 2014-08-07 NOTE — Progress Notes (Signed)
Consult Note: Gyn-Onc   [name] 80 y.o. female  No chief complaint on file. 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  Improved healing of vulvar defect. Improved pain control. Improved functional status.  Plan: The Foley catheter was removed and the patient is able to void spontaneously.   Patient will continue twice daily wound dressing changes. Increase physical therapy. She will be returning home on Monday. We will have her return to see Dr. Denman George in 3 weeks for wound check. Her husband seems very able to pack her wound.   Interval History: Patient returns today for continuing followup.  She reports that the pain in her vulva is much improved and she only needs Tylenol.. She's been able to sit on a doughnut. Apparently she's able walk more with physical therapy assistance.  Colostomy function appears to be normal. She's continuing physical therapy and making more progress .[name] She denies any vaginal bleeding. She denies any fever or chills. Appetite is good.  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 At that time she underwent a modified radical vulvectomy and right inguinal lymphadenectomy. Lymph nodes and surgical margins were negative. She subsequently developed a recurrence on the left vulva in October 2008 underwent left modified radical vulvectomy. Given her age and overall poor performance status we did not perform an inguinal lymphadenectomy.  The patient subsequently had a third recurrence of October 2009 and this was excised and the rhomboid flap was used to fill the surgical defect. Surgical margins were again negative. Her most recent recurrences in September 2012 which is again excised.  She had a superficial wound separation which closed by secondary intent.   She had yet another recurrence in November 2014 treated with external beam radiation therapy because of the lesion location very close to the anus.  She had a partial response but then had progressive disease around the anus with severe pain. As a palliative maneuver our only option was to perform a posterior pelvic exenteration on 05/12/2014. She her initial postoperative course was uncomplicated.     Review of System Ten point review of systems is negative except as noted above      Allergies  Allergen Reactions  . Tenex [Guanfacine Hcl] Other (See Comments)    hypotension  . Crestor [Rosuvastatin] Other (See Comments)    Myalgia    Past Medical History  Diagnosis Date  . Hypertension   . Hyperlipidemia   . Obesity   . Hypothyroidism   . Vulvar cancer, carcinoma 11/03/2011  . Atrial fibrillation   . Cancer 2007    vulva-invasive well diff squamous cell ca  . CVA (cerebral infarction)   . Dyslipidemia   . History of radiation therapy 10/20/2013-12/01/2013    60 gray to perineum area and lower pelvis  . Diabetes mellitus 05-04-14    "Diabetic coma" 1 week ago -tx. by EMT At residence until pt. feeling improved and stable.  . Shortness of breath     occ. with coughing up phelgm usually mornings  . 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 Abnormal voice 05-04-14    "raspy voice" with inspiratory effort-Dr. Lissa Hoard aware 05-04-14  . Stroke 05/2000    right brain CVA pre H&P 2001-  . Anemia     Past Surgical History  Procedure Laterality Date  . Vulvar lesion removal  2012  . Breast surgery      Lumpectomy  . Dilation  and curettage of uterus    . Cystourethroplasty / ureteroneocystostomy    . Radical vulvectomy    . Lymphadenectomy  2007    R inguinal femoral  . Radical vulvectomy  02/27/12  . Vulvar lesion removal  02/27/2012    Procedure: VULVAR LESION;  Surgeon: Alvino Chapel, MD;  Location: [facility];  Service: Gynecology;  Laterality: N/A;  . Right carotid endarterctomy    . Left eye surgery      , permanently stitched eye together  . Vulvectomy N/A 05/12/2014    Procedure: RADICAL  VULVECTOMY WITH COLOSTOMY;  Surgeon: Alvino Chapel, MD;  Location: [facility];  Service: Gynecology;  Laterality: N/A;  . Laparotomy N/A 05/12/2014    Procedure: EXPLORATORY LAPAROTOMY, TOTAL ABDO MINAL HYSTERECTOMY AND BILATERAL SALPINGOOPHERECTOMY;  Surgeon: Alvino Chapel, MD;  Location: [facility];  Service: Gynecology;  Laterality: N/A;  . Esophagogastroduodenoscopy N/A 05/20/2014    Procedure: ESOPHAGOGASTRODUODENOSCOPY (EGD);  Surgeon: Jeryl Columbia, MD;  Location: [facility];  Service: Endoscopy;  Laterality: N/A;    Current Outpatient Prescriptions  Medication Sig Dispense Refill  . atorvastatin (LIPITOR) 20 MG tablet Take 1 tablet (20 mg total) by mouth daily.  30 tablet  3  . calcitRIOL (ROCALTROL) 0.25 MCG capsule Take 0.25 mcg by mouth every Monday, Wednesday, and Friday.      . diltiazem (DILACOR XR) 180 MG 24 hr capsule Take 180 mg by mouth every morning.      . enoxaparin (LOVENOX) 80 MG/0.8ML injection Inject 0.6 mLs (60 mg total) into the skin every 12 (twelve) hours.  22.4 Syringe  2  . furosemide (LASIX) 20 MG tablet Take 20 mg by mouth every morning.      . insulin detemir (LEVEMIR) 100 UNIT/ML injection Inject 9 units into the skin every morning and 8 units every evening.      . insulin lispro (HUMALOG) 100 UNIT/ML injection Inject 14-22 Units into the skin 3 (three) times daily before meals. Inject 14 units into the skin with breakfast, 14 units with lunch, and 22 units with supper.      . levothyroxine (SYNTHROID, LEVOTHROID) 100 MCG tablet Take 100 mcg by mouth as directed. Every day except sunday      . metoprolol (LOPRESSOR) 50 MG tablet Take 50 mg by mouth 2 (two) times daily.      [name] oxyCODONE-acetaminophen (PERCOCET) 5-325 MG per tablet Take one tablet by mouth every 6 hours as needed for pain  120 tablet  0  . pantoprazole (PROTONIX) 40 MG tablet Take 1 tablet (40 mg total) by mouth 2 (two) times daily before a meal.  60 tablet  3  . potassium chloride  (K-DUR,KLOR-CON) 10 MEQ tablet Take 10 mEq by mouth daily.      . traMADol (ULTRAM) 50 MG tablet Take 1 tablet twice daily at 0700 & 1300. Take 1 tablet by mouth every 6 hours as needed *late afternoon, evening, night.  60 tablet  0   No current facility-administered medications for this visit.    History   Social History  . Marital Status: Married    Spouse Name: N/A    Number of Children: N/A  . Years of Education: N/A   Occupational History  . Not on file.   Social History Main Topics  . Smoking status: Former Smoker -- 0.25 packs/day for 18 years    Types: Cigarettes    Quit date: 11/03/1967  . Smokeless tobacco: Never Used  . Alcohol Use: No  .  Drug Use: No  . Sexual Activity: Not Currently   Other Topics Concern  . Not on file   Social History Narrative  . No narrative on file    Family History  Problem Relation Age of Onset  . Diabetes type II    . Coronary artery disease Mother   . Lung cancer Brother   . Diabetes type II Brother        Vitals: There were no vitals taken for this visit.  Physical Exam: In general the patient is a pleasant elderly white female.  HEENT is negative except that her left eyelid has been partially closed surgically.. Neck is supple without thyromegaly.  There is no supraclavicular or inguinal adenopathy.  The abdomen is soft nontender no masses organomegaly or ascites is noted. Colostomy stoma appears healthy.  Pelvic exam EGBUS a Foley catheter is in place and then removed.  She was able to void.    He packing is removed from the perineal defect and tissues appear healthy and is much smaller with increasing amounts of granulation tissue... The defect was irrigated with half-strength peroxide and slightly debrided bluntly. Margins of the wound appear healthy and noninfected.  The dressing is removed and a new dressings soaked in saline as replaced.    [name],Ilee Randleman L, MD 08/07/2014, 2:00  PM

## 2014-08-07 NOTE — Progress Notes (Signed)
Patient ID: [name], female   DOB: Oct 06, 1926, 80 y.o.   MRN: [id number]         PROGRESS NOTE  DATE: 08/06/14  FACILITY:  [facility]  LEVEL OF CARE: SNF (31)  Acute Visit  CHIEF COMPLAINT:  Discharge Notes  HISTORY OF PRESENT ILLNESS: This is an 80 year old female who is for discharge home with Home health PT, OT and Nursing, [place] wound care protocol and ostomy protocol. She has been admitted to [facility] on 6y/01 /15 from [facility] with recurrent vulvar cancer S/P left modified radical vulvectomy. Patient was admitted to this facility for short-term rehabilitation after the patient's recent hospitalization.  Patient has completed SNF rehabilitation and therapy has cleared the patient for discharge.   RE-ASSESSMENT OF ON-GOING PROBLEMS:  HTN: Pt 's HTN remains stable.  Denies CP, sob, DOE, pedal edema, headaches, dizziness or visual disturbances.  No complications from the medications currently being used.  Last BP : 104/63  ATRIAL FIBRILLATION: the patients atrial fibrillation remains stable.  The patient denies DOE, tachycardia, orthopnea, transient neurological sx, pedal edema, palpitations, & PNDs.  No complications noted from the medications currently being used.  DM:pt's Recent CBGs has been elevated.  Pt denies polyuria, polydipsia, polyphagia, changes in vision or hypoglycemic episodes.  No complications noted from the medication presently being used.   CBG logs: 221, 278, 321, 242, 389, 244, 292, 331  PAST MEDICAL HISTORY : Reviewed.  No changes/see problem list  CURRENT MEDICATIONS: Reviewed per MAR/see medication list  REVIEW OF SYSTEMS:  GENERAL: no change in appetite, no fatigue, no weight changes, no fever, chills or weakness RESPIRATORY: no cough, SOB, DOE, wheezing, hemoptysis CARDIAC: no chest pain, edema or palpitations GI: no abdominal pain, diarrhea, constipation, heart burn, nausea or vomiting  PHYSICAL  EXAMINATION  GENERAL: no acute distress, normal body habitus EYES: conjunctivae normal, sclerae normal, left eyelid partially closed NECK: supple, trachea midline, no neck masses, no thyroid tenderness, no thyromegaly RESPIRATORY: breathing is even & unlabored, BS CTAB CARDIAC: RRR, no murmur,no extra heart sounds, no edema GI: abdomen soft, normal BS, no masses, no tenderness, no hepatomegaly, no splenomegaly, +colostomy,  EXTREMITIES:  Able to move all 4 extremities PSYCHIATRIC: the patient is alert & oriented to person, affect & behavior appropriate  LABS/RADIOLOGY: Labs reviewed: Basic Metabolic Panel:  Recent Labs  05/18/14 0535  05/21/14 1830 05/22/14 0440 05/24/14 0514  NA 143  < > 140 139 139  K 3.2*  < > 4.2 4.6 4.3  CL 104  < > 102 102 102  CO2 29  < > 27 26 27   GLUCOSE 58*  < > 207* 221* 145*  BUN 29*  < > 18 16 16   CREATININE 1.26*  < > 1.33* 1.28* 1.32*  CALCIUM 8.0*  < > 8.0* 8.1* 8.5  MG 1.3*  --  1.5  --   --   PHOS 2.7  --   --   --   --   < > = values in this interval not displayed. Liver Function Tests:  Recent Labs  05/04/14 1037 05/04/14 1415 05/16/14 0445  AST 20 17 15   ALT 14 14 10   ALKPHOS 61 75 61  BILITOT 1.2 0.8 0.8  PROT 6.9 7.3 4.9*  ALBUMIN 3.2* 3.2* 1.9*   CBC:  Recent Labs  09/24/13 1031 03/04/14 1019 05/04/14 1415  05/23/14 0423 05/24/14 0514 05/25/14 0354  WBC 7.8 8.2 8.2  < > 9.4  8.2 8.0  NEUTROABS 5.9 6.6 6.8  --   --   --   --   HGB 13.0 12.4 12.4  < > 10.2* 9.9* 9.8*  HCT 39.4 38.2 39.1  < > 31.4* 32.1* 31.8*  MCV 98.3 99.5 99.2  < > 96.3 97.0 97.5  PLT 157.0 190.0 187  < > 201 211 227  < > = values in this interval not displayed.  Lipid Panel:  Recent Labs  09/24/13 1031 11/24/13 1104 04/06/14 1615  HDL 31.10* 29.80* 28.70*   CBG:  Recent Labs  05/24/14 1703 05/24/14 2124 05/25/14 0729  GLUCAP 184* 220* 125*    ASSESSMENT/PLAN:  Vulvar Cancer S/P left modified radical vulvectomy - followed-up  by GYN-ONC Hyperlipidemia - continue Lipitor Diabetes Mellitus, type 2 -  continue Humalog and increase Levemir to 12 units SQ Q AM and HS Atrial Fibrillation - rate-controlled; continue Lopressor Hypothyroidism - continue Synthroid Hypertension - well-controlled; continue Lopressor and Cardizem Hx of GI Bleed - continue Protonix   I have filled out patient's discharge paperwork and written prescriptions.  Patient will receive home health PT, OT and Nursing.   Total discharge time: Greater than 30 minutes  Discharge time involved coordination of the discharge process with social worker, nursing staff and therapy department. Medical justification for home health services verified.    CPT CODE: 83338  [name] - NP [facility] [phone]

## 2014-08-10 ENCOUNTER — Other Ambulatory Visit: Payer: Self-pay | Admitting: *Deleted

## 2014-08-10 MED ORDER — GLUCOSE BLOOD VI STRP: ORAL_STRIP | Status: DC

## 2014-08-11 DIAGNOSIS — E1165 Type 2 diabetes mellitus with hyperglycemia: Secondary | ICD-10-CM

## 2014-08-11 DIAGNOSIS — C519 Malignant neoplasm of vulva, unspecified: Secondary | ICD-10-CM

## 2014-08-11 DIAGNOSIS — T8189XA Other complications of procedures, not elsewhere classified, initial encounter: Secondary | ICD-10-CM

## 2014-08-11 DIAGNOSIS — I4891 Unspecified atrial fibrillation: Secondary | ICD-10-CM

## 2014-08-11 DIAGNOSIS — IMO0001 Reserved for inherently not codable concepts without codable children: Secondary | ICD-10-CM

## 2014-08-20 ENCOUNTER — Telehealth: Payer: Self-pay | Admitting: Gynecologic Oncology

## 2014-08-20 NOTE — Telephone Encounter (Signed)
Returned message to [place] at [facility].  Message left with patient's recent blood pressures of 96/60, 62/43, and 76/48.  Advised to contact the patient's primary care provider who prescribes her blood pressure medications and to seek emergency assistance if needed.

## 2014-08-21 ENCOUNTER — Telehealth: Payer: Self-pay | Admitting: Endocrinology

## 2014-08-21 NOTE — Telephone Encounter (Signed)
Could you please see below and advise during Dr. Dwyane Dee absence? Thanks!

## 2014-08-21 NOTE — Telephone Encounter (Signed)
Even without sxs, this needs to be checked.  Please see ER or urgent care today

## 2014-08-21 NOTE — Telephone Encounter (Signed)
Patient B/P is 96/50 lying down  62/43 sitting Sitting up for a while 76/48  Patient asymptomatic   Please advise    Thank You

## 2014-08-21 NOTE — Telephone Encounter (Signed)
Called and advised pt's wife and husband. Pt was made aware that Dr. Dwyane Dee was out of the office and that covering MD saw best that pt be checked at a urgent care or ED. Pt states that she couldn't go to be seen. Pt did not like advise. Spoke with pt's husband and explained that covering MD thought it best to be check by urgent care of ED. Advised that was the only instructions that we could give during Dr. Ronnie Derby absence. Pt's husband voiced understanding and stated he would be back in touch once Dr. Dwyane Dee returned.

## 2014-08-24 ENCOUNTER — Encounter: Payer: Self-pay | Admitting: Internal Medicine

## 2014-08-28 ENCOUNTER — Ambulatory Visit: Payer: Medicare Other | Attending: Gynecologic Oncology | Admitting: Gynecologic Oncology

## 2014-08-28 VITALS — BP 104/64 | HR 99 | Temp 97.8°F | Resp 22

## 2014-08-28 DIAGNOSIS — I10 Essential (primary) hypertension: Secondary | ICD-10-CM | POA: Diagnosis not present

## 2014-08-28 DIAGNOSIS — Z794 Long term (current) use of insulin: Secondary | ICD-10-CM | POA: Insufficient documentation

## 2014-08-28 DIAGNOSIS — Z888 Allergy status to other drugs, medicaments and biological substances status: Secondary | ICD-10-CM | POA: Diagnosis not present

## 2014-08-28 DIAGNOSIS — Z79899 Other long term (current) drug therapy: Secondary | ICD-10-CM | POA: Diagnosis not present

## 2014-08-28 DIAGNOSIS — Y838 Other surgical procedures as the cause of abnormal reaction of the patient, or of later complication, without mention of misadventure at the time of the procedure: Secondary | ICD-10-CM | POA: Diagnosis not present

## 2014-08-28 DIAGNOSIS — Z933 Colostomy status: Secondary | ICD-10-CM | POA: Insufficient documentation

## 2014-08-28 DIAGNOSIS — T8131XA Disruption of external operation (surgical) wound, not elsewhere classified, initial encounter: Secondary | ICD-10-CM | POA: Insufficient documentation

## 2014-08-28 DIAGNOSIS — I4891 Unspecified atrial fibrillation: Secondary | ICD-10-CM | POA: Diagnosis not present

## 2014-08-28 DIAGNOSIS — E039 Hypothyroidism, unspecified: Secondary | ICD-10-CM | POA: Diagnosis not present

## 2014-08-28 DIAGNOSIS — E785 Hyperlipidemia, unspecified: Secondary | ICD-10-CM | POA: Diagnosis not present

## 2014-08-28 DIAGNOSIS — Z8544 Personal history of malignant neoplasm of other female genital organs: Secondary | ICD-10-CM | POA: Insufficient documentation

## 2014-08-28 DIAGNOSIS — E119 Type 2 diabetes mellitus without complications: Secondary | ICD-10-CM | POA: Insufficient documentation

## 2014-08-28 DIAGNOSIS — Z8673 Personal history of transient ischemic attack (TIA), and cerebral infarction without residual deficits: Secondary | ICD-10-CM | POA: Insufficient documentation

## 2014-08-28 DIAGNOSIS — Z923 Personal history of irradiation: Secondary | ICD-10-CM | POA: Insufficient documentation

## 2014-08-28 DIAGNOSIS — T8189XA Other complications of procedures, not elsewhere classified, initial encounter: Secondary | ICD-10-CM | POA: Diagnosis not present

## 2014-08-28 DIAGNOSIS — Z87891 Personal history of nicotine dependence: Secondary | ICD-10-CM | POA: Diagnosis not present

## 2014-08-28 DIAGNOSIS — D649 Anemia, unspecified: Secondary | ICD-10-CM | POA: Diagnosis not present

## 2014-08-28 DIAGNOSIS — E669 Obesity, unspecified: Secondary | ICD-10-CM | POA: Diagnosis not present

## 2014-08-28 DIAGNOSIS — C519 Malignant neoplasm of vulva, unspecified: Secondary | ICD-10-CM

## 2014-08-28 DIAGNOSIS — Y9210 Unspecified residential institution as the place of occurrence of the external cause: Secondary | ICD-10-CM | POA: Diagnosis not present

## 2014-08-29 NOTE — Progress Notes (Signed)
Consult Note: Gyn-Onc   [name] 80 y.o. female  Chief Complaint  Patient presents with  . 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  Improved but slow healing of vulvar defect with some inadequate debridement of exudative tissue (which was debrided today). Persistent bladder dysfunction/incontinence and retention necessitating prolonged foley catheterization.  Plan: Home health nursing to assess and treat the perineal wound. This will assist [name]'s husband in ensuring adequacy of packing and regular debridement of fibrinous exudate.  I would hold off foley removal until her home health and home assistance situation is better adjusted (she failed her last attempt at long term removal, and her husband is about to be hospitalized for his own health issues). Social work consultation to assist in arrangements for colostomy, wound and foley management while [name]'s husband is in hospital for vascular surgery (and recovering). We discussed that long term arrangements (eg assisted living or nursing home residency) may prove to be best longterm given that both are experiencing significant deterioration in health and do not have apparent adequate assistance from family to maintain safe independence in the home.   Patient will continue twice daily wound dressing changes. We will have her return to see Dr. Fermin Schwab in 1 month for wound check. Her husband seems very able to pack her wound.   Interval History: Patient returns today for continuing followup.  She reports that the pain in her vulva is much improved and she only needs Tylenol.. She's been able to sit on a doughnut. Apparently she's able walk more.  She required replacement of her foley at the nursing home (for incontinence and retention) after its removal at the last visit. She still has an indwelling transurethral catheter.   She was discharged from the nursing home and is at  home with her husband who is performing colostomy management and BID perineal dressing changes. He reports that he believes it is healing well.   Colostomy function appears to be normal. She denies any fever or chills. Appetite is fair (stable) but she is eating adequately by report and is supplementing with ensure and protein drinks.  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 At that time she underwent a modified radical vulvectomy and right inguinal lymphadenectomy. Lymph nodes and surgical margins were negative. She subsequently developed a recurrence on the left vulva in October 2008 underwent left modified radical vulvectomy. Given her age and overall poor performance status we did not perform an inguinal lymphadenectomy.  The patient subsequently had a third recurrence of October 2009 and this was excised and the rhomboid flap was used to fill the surgical defect. Surgical margins were again negative. Her most recent recurrences in September 2012 which is again excised.  She had a superficial wound separation which closed by secondary intent.   She had yet another recurrence in November 2014 treated with external beam radiation therapy because of the lesion location very close to the anus. She had a partial response but then had progressive disease around the anus with severe pain. As a palliative maneuver our only option was to perform a posterior pelvic exenteration on 05/12/2014. She her initial postoperative course was uncomplicated with the exception of wound separation of the perineal flap from the surrounding skin requiring wound packing.     Review of System Ten point review of systems is negative except as noted above      Allergies  Allergen Reactions  .  Tenex [Guanfacine Hcl] Other (See Comments)    hypotension  . Crestor [Rosuvastatin] Other (See Comments)    Myalgia    Past Medical History  Diagnosis Date  . Hypertension   .  Hyperlipidemia   . Obesity   . Hypothyroidism   . Vulvar cancer, carcinoma 11/03/2011  . Atrial fibrillation   . Cancer 2007    vulva-invasive well diff squamous cell ca  . CVA (cerebral infarction)   . Dyslipidemia   . History of radiation therapy 10/20/2013-12/01/2013    60 gray to perineum area and lower pelvis  . Diabetes mellitus 05-04-14    "Diabetic coma" 1 week ago -tx. by EMT At residence until pt. feeling improved and stable.  . Shortness of breath     occ. with coughing up phelgm usually mornings  . 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 Abnormal voice 05-04-14    "raspy voice" with inspiratory effort-Dr. Lissa Hoard aware 05-04-14  . Stroke 05/2000    right brain CVA pre H&P 2001-  . Anemia     Past Surgical History  Procedure Laterality Date  . Vulvar lesion removal  2012  . Breast surgery      Lumpectomy  . Dilation and curettage of uterus    . Cystourethroplasty / ureteroneocystostomy    . Radical vulvectomy    . Lymphadenectomy  2007    R inguinal femoral  . Radical vulvectomy  02/27/12  . Vulvar lesion removal  02/27/2012    Procedure: VULVAR LESION;  Surgeon: Alvino Chapel, MD;  Location: [facility];  Service: Gynecology;  Laterality: N/A;  . Right carotid endarterctomy    . Left eye surgery      , permanently stitched eye together  . Vulvectomy N/A 05/12/2014    Procedure: RADICAL VULVECTOMY WITH COLOSTOMY;  Surgeon: Alvino Chapel, MD;  Location: [facility];  Service: Gynecology;  Laterality: N/A;  . Laparotomy N/A 05/12/2014    Procedure: EXPLORATORY LAPAROTOMY, TOTAL ABDO MINAL HYSTERECTOMY AND BILATERAL SALPINGOOPHERECTOMY;  Surgeon: Alvino Chapel, MD;  Location: [facility];  Service: Gynecology;  Laterality: N/A;  . Esophagogastroduodenoscopy N/A 05/20/2014    Procedure: ESOPHAGOGASTRODUODENOSCOPY (EGD);  Surgeon: Jeryl Columbia, MD;  Location: [facility];  Service: Endoscopy;  Laterality: N/A;    Current Outpatient  Prescriptions  Medication Sig Dispense Refill  . atorvastatin (LIPITOR) 20 MG tablet Take 1 tablet (20 mg total) by mouth daily.  30 tablet  3  . calcitRIOL (ROCALTROL) 0.25 MCG capsule Take 0.25 mcg by mouth every Monday, Wednesday, and Friday.      . diltiazem (CARDIZEM CD) 180 MG 24 hr capsule       . diltiazem (DILACOR XR) 180 MG 24 hr capsule Take 180 mg by mouth every morning.      . enoxaparin (LOVENOX) 80 MG/0.8ML injection Inject 0.6 mLs (60 mg total) into the skin every 12 (twelve) hours.  22.4 Syringe  2  . furosemide (LASIX) 20 MG tablet Take 20 mg by mouth every morning.      [name] GLUCAGON EMERGENCY 1 MG injection       . glucose blood (ONE TOUCH ULTRA TEST) test strip Use as instructed to check blood sugar 3 times per day dx code 250.42  100 each  5  . insulin detemir (LEVEMIR) 100 UNIT/ML injection Inject 9 units into the skin every morning and 8 units every evening.      . insulin lispro (HUMALOG) 100 UNIT/ML injection Inject  14-22 Units into the skin 3 (three) times daily before meals. Inject 14 units into the skin with breakfast, 14 units with lunch, and 22 units with supper.      [name] KLOR-CON 10 10 MEQ tablet       . levothyroxine (SYNTHROID, LEVOTHROID) 100 MCG tablet Take 100 mcg by mouth as directed. Every day except sunday      . metoprolol (LOPRESSOR) 50 MG tablet Take 50 mg by mouth 2 (two) times daily.      [name] oxyCODONE-acetaminophen (PERCOCET) 5-325 MG per tablet Take one tablet by mouth every 6 hours as needed for pain  120 tablet  0  . pantoprazole (PROTONIX) 40 MG tablet Take 1 tablet (40 mg total) by mouth 2 (two) times daily before a meal.  60 tablet  3  . potassium chloride (K-DUR,KLOR-CON) 10 MEQ tablet Take 10 mEq by mouth daily.      . traMADol (ULTRAM) 50 MG tablet Take 1 tablet twice daily at 0700 & 1300. Take 1 tablet by mouth every 6 hours as needed *late afternoon, evening, night.  60 tablet  0   No current facility-administered medications for this visit.     History   Social History  . Marital Status: Married    Spouse Name: N/A    Number of Children: N/A  . Years of Education: N/A   Occupational History  . Not on file.   Social History Main Topics  . Smoking status: Former Smoker -- 0.25 packs/day for 18 years    Types: Cigarettes    Quit date: 11/03/1967  . Smokeless tobacco: Never Used  . Alcohol Use: No  . Drug Use: No  . Sexual Activity: Not Currently   Other Topics Concern  . Not on file   Social History Narrative  . No narrative on file    Family History  Problem Relation Age of Onset  . Diabetes type II    . Coronary artery disease Mother   . Lung cancer Brother   . Diabetes type II Brother        Vitals: Blood pressure 104/64, pulse 99, temperature 97.8 F (36.6 C), temperature source Oral, resp. rate 22.  Physical Exam: In general the patient is a pleasant elderly white female.  HEENT is negative except that her left eyelid has been partially closed surgically.. Neck is supple without thyromegaly.  There is no supraclavicular or inguinal adenopathy.  The abdomen is soft nontender no masses organomegaly or ascites is noted. Colostomy stoma appears healthy.  Pelvic exam EGBUS a Foley catheter is in place.      He packing is removed from the perineal defect and tissues appear healthy and is much smaller with healthy granulation tissue at the lateral walls. The central midline perineal area is somewhat deeper and less adequately packed. This area contains substantial fibrinous exudate. The defect was irrigated with saline and slightly debrided bluntly. Margins of the wound appear healthy and noninfected.  The dressing is removed and a new dressings soaked in saline as replaced.  Donaciano Eva, MD 08/30/2014, 12:06 AM

## 2014-08-30 ENCOUNTER — Encounter: Payer: Self-pay | Admitting: Gynecologic Oncology

## 2014-09-03 ENCOUNTER — Other Ambulatory Visit: Payer: Self-pay | Admitting: Adult Health

## 2014-09-03 ENCOUNTER — Encounter: Payer: Self-pay | Admitting: *Deleted

## 2014-09-03 NOTE — Progress Notes (Signed)
[place] Work  Clinical Social Work was referred by Engineer, [employer] for assistance with SNF placement from the community.  Clinical Social Worker assisted with FL2 updates for MD to sign to assist [facility] with SNF placement to [facility].    Loren Racer, [place] Worker [place]  [place] Phone: [phone] Fax: [phone]

## 2014-09-04 ENCOUNTER — Telehealth: Payer: Self-pay | Admitting: *Deleted

## 2014-09-04 ENCOUNTER — Encounter: Payer: Self-pay | Admitting: *Deleted

## 2014-09-04 NOTE — Telephone Encounter (Signed)
Patient had upper GI bleed on lovenox + coumadin late 04/2014.  Once this resolved it was recommended to restart anticoagulation, however GYN wanted patient to go on lovenox as opposed to warfarin.  We have been waiting for GYN to give clearance for patient to change back to warfarin.  It appears patient will run out of lovenox soon.  See 06/18/14 office note from [place] in cardiology for summary.    Will forward to GYN since they were who wanted her on lovenox and not coumadin and see if it is appropriate to now change her back to warfarin.   Dr. Denman George, please address and forward back to Dr. Theodosia Blender office.

## 2014-09-04 NOTE — Telephone Encounter (Signed)
Called pt regarding placement, spoke with husband who advised she will be going to [facility] on Wed 9/16, pt has 21 days left that her insurance will pay for. Husband had concern regarding pt's medication Lovenox. He only has 11 shots left. Injections are BID.  Called and message left for Dr. Landis Gandy nurse regarding Lovenox.

## 2014-09-04 NOTE — Telephone Encounter (Signed)
Please see Mary's note below and advise.

## 2014-09-04 NOTE — Telephone Encounter (Signed)
TO Jeremy to advise. 

## 2014-09-04 NOTE — Progress Notes (Signed)
[place] Work  Clinical Social Work faxed signed FL2, recent progress note and med list to [facility] admission rep. CSW informed [place], social worker, Cindie Sillmon as well. Cindie is assisting with placement in the community.    Clinical Social Work interventions: SNF placement  Loren Racer, [place] Worker [place]  [place] Phone: [phone] Fax: [phone]

## 2014-09-07 ENCOUNTER — Other Ambulatory Visit: Payer: Self-pay

## 2014-09-07 ENCOUNTER — Telehealth: Payer: Self-pay | Admitting: Pharmacist

## 2014-09-07 NOTE — Telephone Encounter (Signed)
Spoke with gynecology and with Dr. Radford Pax who agree to have patient stop lovenox and change back to lovenox.  Patient had a GI bleed while on combination of lovenox and coumadin in 04/2014.  Patient has a h/o CVA.  Discussed with Dr. Radford Pax who wants to continue lovenox for 24 hours only, and start warfarin back today and with a bolus for 2 days since stopping lovenox.  Patient's husband is going to have surgery later this week and he tells me patient will be admitted to [facility] later this week.  [place] will manage warfarin.    Patient / husband advised to have patient take warfarin 7.5 mg today and tomorrow, then resume warfarin 2.5 mg qd except 5 mg T/Th/Sat.  Take lovenox this morning and this evening, then d/c lovenox.  Husband understands that she will f/u with [place] again after being discharged from [facility].  He will call back if for some reason she can't get into [facility].  He isn't able to drive her to the office for visits at this time due to his foot problems.

## 2014-09-08 MED ORDER — METOPROLOL TARTRATE 50 MG PO TABS: 50.0000 mg | ORAL_TABLET | Freq: Two times a day (BID) | ORAL | Status: DC

## 2014-09-08 MED ORDER — ATORVASTATIN CALCIUM 20 MG PO TABS: 20.0000 mg | ORAL_TABLET | Freq: Every day | ORAL | Status: DC

## 2014-09-08 MED ORDER — DILTIAZEM HCL ER 180 MG PO CP24: 180.0000 mg | ORAL_CAPSULE | Freq: Every morning | ORAL | Status: DC

## 2014-09-08 MED ORDER — POTASSIUM CHLORIDE CRYS ER 10 MEQ PO TBCR: 10.0000 meq | EXTENDED_RELEASE_TABLET | Freq: Every day | ORAL | Status: DC

## 2014-09-08 MED ORDER — FUROSEMIDE 20 MG PO TABS
20.0000 mg | ORAL_TABLET | Freq: Every morning | ORAL | Status: DC
Start: 2014-09-08 — End: 2014-10-17

## 2014-09-09 ENCOUNTER — Telehealth: Payer: Self-pay | Admitting: Pharmacist

## 2014-09-09 NOTE — Telephone Encounter (Signed)
Nurse Juliann Pulse) with [facility] called to confirm patient's warfarin dose now that she has transitioned off of lovenox.  Verbal instruction given and also faxed current med list to nurse as well.  They will monitor patient's protimes while she is in [facility].

## 2014-09-15 ENCOUNTER — Other Ambulatory Visit (HOSPITAL_COMMUNITY): Payer: Self-pay

## 2014-10-15 NOTE — Telephone Encounter (Signed)
error 

## 2014-10-16 ENCOUNTER — Telehealth: Payer: Self-pay | Admitting: Endocrinology

## 2014-10-16 NOTE — Telephone Encounter (Signed)
If she is still nauseated and not able to keep fluids down she needs to go to the ER. Also needs followup appointment

## 2014-10-16 NOTE — Telephone Encounter (Signed)
Please see below.

## 2014-10-16 NOTE — Telephone Encounter (Signed)
Pt has been sick with n/v/d since yesterday began with the vomiting this am, urine is clear, has not been able to hold down food today.   Her blood sugars are 278 now and at 7 it was 228.   Please advise if she needs to go to the ER. Patient will not be able to come in today.   She does not have a fever. She is resting now has had no insulin and did not keep down the pills for her AM she is pale.  Home health nurse is the one calling this in.

## 2014-10-16 NOTE — Telephone Encounter (Signed)
Noted, husband is aware

## 2014-10-17 ENCOUNTER — Encounter (HOSPITAL_COMMUNITY): Payer: Self-pay | Admitting: Emergency Medicine

## 2014-10-17 ENCOUNTER — Emergency Department (HOSPITAL_COMMUNITY): Payer: Medicare Other

## 2014-10-17 ENCOUNTER — Inpatient Hospital Stay (HOSPITAL_COMMUNITY)
Admission: EM | Admit: 2014-10-17 | Discharge: 2014-10-22 | DRG: 689 | Disposition: A | Discharge status: Home / Self Care | Payer: Medicare Other | Attending: Internal Medicine | Admitting: Internal Medicine

## 2014-10-17 DIAGNOSIS — Z8711 Personal history of peptic ulcer disease: Secondary | ICD-10-CM | POA: Diagnosis not present

## 2014-10-17 DIAGNOSIS — E669 Obesity, unspecified: Secondary | ICD-10-CM

## 2014-10-17 DIAGNOSIS — Z933 Colostomy status: Secondary | ICD-10-CM

## 2014-10-17 DIAGNOSIS — E038 Other specified hypothyroidism: Secondary | ICD-10-CM

## 2014-10-17 DIAGNOSIS — S3140XS Unspecified open wound of vagina and vulva, sequela: Secondary | ICD-10-CM

## 2014-10-17 DIAGNOSIS — Y838 Other surgical procedures as the cause of abnormal reaction of the patient, or of later complication, without mention of misadventure at the time of the procedure: Secondary | ICD-10-CM | POA: Diagnosis present

## 2014-10-17 DIAGNOSIS — N183 Chronic kidney disease, stage 3 unspecified: Secondary | ICD-10-CM

## 2014-10-17 DIAGNOSIS — C519 Malignant neoplasm of vulva, unspecified: Secondary | ICD-10-CM

## 2014-10-17 DIAGNOSIS — Z8673 Personal history of transient ischemic attack (TIA), and cerebral infarction without residual deficits: Secondary | ICD-10-CM | POA: Diagnosis not present

## 2014-10-17 DIAGNOSIS — N390 Urinary tract infection, site not specified: Principal | ICD-10-CM

## 2014-10-17 DIAGNOSIS — Z794 Long term (current) use of insulin: Secondary | ICD-10-CM | POA: Diagnosis not present

## 2014-10-17 DIAGNOSIS — B3789 Other sites of candidiasis: Secondary | ICD-10-CM | POA: Diagnosis present

## 2014-10-17 DIAGNOSIS — E860 Dehydration: Secondary | ICD-10-CM | POA: Diagnosis present

## 2014-10-17 DIAGNOSIS — Z85038 Personal history of other malignant neoplasm of large intestine: Secondary | ICD-10-CM

## 2014-10-17 DIAGNOSIS — N189 Chronic kidney disease, unspecified: Secondary | ICD-10-CM

## 2014-10-17 DIAGNOSIS — E11649 Type 2 diabetes mellitus with hypoglycemia without coma: Secondary | ICD-10-CM | POA: Diagnosis present

## 2014-10-17 DIAGNOSIS — I129 Hypertensive chronic kidney disease with stage 1 through stage 4 chronic kidney disease, or unspecified chronic kidney disease: Secondary | ICD-10-CM | POA: Diagnosis present

## 2014-10-17 DIAGNOSIS — Z7901 Long term (current) use of anticoagulants: Secondary | ICD-10-CM

## 2014-10-17 DIAGNOSIS — Z888 Allergy status to other drugs, medicaments and biological substances status: Secondary | ICD-10-CM | POA: Diagnosis not present

## 2014-10-17 DIAGNOSIS — E785 Hyperlipidemia, unspecified: Secondary | ICD-10-CM | POA: Diagnosis present

## 2014-10-17 DIAGNOSIS — I4891 Unspecified atrial fibrillation: Secondary | ICD-10-CM

## 2014-10-17 DIAGNOSIS — E162 Hypoglycemia, unspecified: Secondary | ICD-10-CM

## 2014-10-17 DIAGNOSIS — Z6821 Body mass index (BMI) 21.0-21.9, adult: Secondary | ICD-10-CM

## 2014-10-17 DIAGNOSIS — T8351XA Infection and inflammatory reaction due to indwelling urinary catheter, initial encounter: Secondary | ICD-10-CM

## 2014-10-17 DIAGNOSIS — E43 Unspecified severe protein-calorie malnutrition: Secondary | ICD-10-CM | POA: Diagnosis present

## 2014-10-17 DIAGNOSIS — E876 Hypokalemia: Secondary | ICD-10-CM

## 2014-10-17 DIAGNOSIS — Z923 Personal history of irradiation: Secondary | ICD-10-CM | POA: Diagnosis not present

## 2014-10-17 DIAGNOSIS — I10 Essential (primary) hypertension: Secondary | ICD-10-CM

## 2014-10-17 DIAGNOSIS — E1122 Type 2 diabetes mellitus with diabetic chronic kidney disease: Secondary | ICD-10-CM

## 2014-10-17 DIAGNOSIS — B3749 Other urogenital candidiasis: Secondary | ICD-10-CM

## 2014-10-17 DIAGNOSIS — I350 Nonrheumatic aortic (valve) stenosis: Secondary | ICD-10-CM

## 2014-10-17 DIAGNOSIS — Z87891 Personal history of nicotine dependence: Secondary | ICD-10-CM | POA: Diagnosis not present

## 2014-10-17 DIAGNOSIS — T68XXXA Hypothermia, initial encounter: Secondary | ICD-10-CM | POA: Diagnosis not present

## 2014-10-17 DIAGNOSIS — E039 Hypothyroidism, unspecified: Secondary | ICD-10-CM | POA: Diagnosis present

## 2014-10-17 DIAGNOSIS — E1129 Type 2 diabetes mellitus with other diabetic kidney complication: Secondary | ICD-10-CM | POA: Diagnosis present

## 2014-10-17 DIAGNOSIS — R1110 Vomiting, unspecified: Secondary | ICD-10-CM

## 2014-10-17 DIAGNOSIS — T68XXXS Hypothermia, sequela: Secondary | ICD-10-CM

## 2014-10-17 DIAGNOSIS — T68XXXD Hypothermia, subsequent encounter: Secondary | ICD-10-CM

## 2014-10-17 DIAGNOSIS — T83511A Infection and inflammatory reaction due to indwelling urethral catheter, initial encounter: Secondary | ICD-10-CM

## 2014-10-17 HISTORY — DX: Other specified disorders of cornea, unspecified eye: H18.899

## 2014-10-17 HISTORY — DX: Type 2 diabetes mellitus with other diabetic kidney complication: E11.29

## 2014-10-17 HISTORY — DX: Peptic ulcer, site unspecified, unspecified as acute or chronic, without hemorrhage or perforation: K27.9

## 2014-10-17 HISTORY — DX: Pneumonia, unspecified organism: J18.9

## 2014-10-17 HISTORY — DX: Chronic kidney disease, stage 3 unspecified: N18.30

## 2014-10-17 HISTORY — DX: Gastrointestinal hemorrhage, unspecified: K92.2

## 2014-10-17 HISTORY — DX: Unspecified macular degeneration: H35.30

## 2014-10-17 HISTORY — DX: Type 2 diabetes mellitus with hyperglycemia: E11.65

## 2014-10-17 HISTORY — DX: Chronic kidney disease, stage 3 (moderate): N18.3

## 2014-10-17 HISTORY — DX: Infection following a procedure, other surgical site, initial encounter: T81.49XA

## 2014-10-17 LAB — URINE MICROSCOPIC-ADD ON

## 2014-10-17 LAB — URINALYSIS, ROUTINE W REFLEX MICROSCOPIC
Bilirubin Urine: NEGATIVE
Glucose, UA: NEGATIVE mg/dL
Glucose, UA: NEGATIVE mg/dL
KETONES UR: NEGATIVE mg/dL
Ketones, ur: NEGATIVE mg/dL
Nitrite: NEGATIVE
Nitrite: NEGATIVE
PH: 5 (ref 5.0–8.0)
PROTEIN: 100 mg/dL — AB
Protein, ur: 30 mg/dL — AB
Specific Gravity, Urine: 1.017 (ref 1.005–1.030)
Specific Gravity, Urine: 1.021 (ref 1.005–1.030)
Urobilinogen, UA: 1 mg/dL (ref 0.0–1.0)
Urobilinogen, UA: 1 mg/dL (ref 0.0–1.0)
pH: 8 (ref 5.0–8.0)

## 2014-10-17 LAB — CBC WITH DIFFERENTIAL/PLATELET
Basophils Absolute: 0 10*3/uL (ref 0.0–0.1)
Basophils Relative: 0 % (ref 0–1)
EOS ABS: 0 10*3/uL (ref 0.0–0.7)
Eosinophils Relative: 0 % (ref 0–5)
HEMATOCRIT: 34.9 % — AB (ref 36.0–46.0)
Hemoglobin: 11.2 g/dL — ABNORMAL LOW (ref 12.0–15.0)
Lymphocytes Relative: 6 % — ABNORMAL LOW (ref 12–46)
Lymphs Abs: 0.6 10*3/uL — ABNORMAL LOW (ref 0.7–4.0)
MCH: 31.5 pg (ref 26.0–34.0)
MCHC: 32.1 g/dL (ref 30.0–36.0)
MCV: 98 fL (ref 78.0–100.0)
MONO ABS: 0.7 10*3/uL (ref 0.1–1.0)
Monocytes Relative: 7 % (ref 3–12)
Neutro Abs: 8.7 10*3/uL — ABNORMAL HIGH (ref 1.7–7.7)
Neutrophils Relative %: 87 % — ABNORMAL HIGH (ref 43–77)
PLATELETS: 186 10*3/uL (ref 150–400)
RBC: 3.56 MIL/uL — AB (ref 3.87–5.11)
RDW: 15.7 % — ABNORMAL HIGH (ref 11.5–15.5)
WBC: 10 10*3/uL (ref 4.0–10.5)

## 2014-10-17 LAB — COMPREHENSIVE METABOLIC PANEL
ALBUMIN: 2.4 g/dL — AB (ref 3.5–5.2)
AST: 9 U/L (ref 0–37)
Alkaline Phosphatase: 68 U/L (ref 39–117)
Anion gap: 14 (ref 5–15)
BUN: 26 mg/dL — ABNORMAL HIGH (ref 6–23)
CALCIUM: 8.3 mg/dL — AB (ref 8.4–10.5)
CO2: 23 mEq/L (ref 19–32)
Chloride: 102 mEq/L (ref 96–112)
Creatinine, Ser: 1.44 mg/dL — ABNORMAL HIGH (ref 0.50–1.10)
GFR calc Af Amer: 37 mL/min — ABNORMAL LOW (ref >90)
GFR, EST NON AFRICAN AMERICAN: 32 mL/min — AB (ref >90)
Glucose, Bld: 200 mg/dL — ABNORMAL HIGH (ref 70–99)
Potassium: 4.5 mEq/L (ref 3.7–5.3)
SODIUM: 139 meq/L (ref 137–147)
Total Bilirubin: 0.7 mg/dL (ref 0.3–1.2)
Total Protein: 5.8 g/dL — ABNORMAL LOW (ref 6.0–8.3)

## 2014-10-17 LAB — GLUCOSE, CAPILLARY: Glucose-Capillary: 118 mg/dL — ABNORMAL HIGH (ref 70–99)

## 2014-10-17 LAB — LACTIC ACID, PLASMA: LACTIC ACID, VENOUS: 2.2 mmol/L (ref 0.5–2.2)

## 2014-10-17 LAB — PROTIME-INR
INR: 2.32 — ABNORMAL HIGH (ref 0.00–1.49)
PROTHROMBIN TIME: 25.7 s — AB (ref 11.6–15.2)

## 2014-10-17 MED ORDER — DILTIAZEM HCL ER COATED BEADS 180 MG PO CP24
180.0000 mg | ORAL_CAPSULE | Freq: Every day | ORAL | Status: DC
  Administered 2014-10-17 – 2014-10-22 (×6): 180 mg via ORAL
  Filled 2014-10-17 (×6): qty 1

## 2014-10-17 MED ORDER — ONDANSETRON HCL 4 MG/2ML IJ SOLN: 4.0000 mg | Freq: Four times a day (QID) | INTRAMUSCULAR | Status: DC | PRN

## 2014-10-17 MED ORDER — VITAMIN D3 25 MCG (1000 UNIT) PO TABS
1000.0000 [IU] | ORAL_TABLET | Freq: Every day | ORAL | Status: DC
  Administered 2014-10-17 – 2014-10-22 (×6): 1000 [IU] via ORAL
  Filled 2014-10-17 (×6): qty 1

## 2014-10-17 MED ORDER — DEXTROSE 5 % IV SOLN
1.0000 g | INTRAVENOUS | Status: AC
  Administered 2014-10-17 – 2014-10-22 (×6): 1 g via INTRAVENOUS
  Filled 2014-10-17 (×6): qty 10

## 2014-10-17 MED ORDER — ACETAMINOPHEN 325 MG PO TABS
650.0000 mg | ORAL_TABLET | Freq: Four times a day (QID) | ORAL | Status: DC | PRN
  Administered 2014-10-20 – 2014-10-21 (×2): 650 mg via ORAL
  Filled 2014-10-17 (×3): qty 2

## 2014-10-17 MED ORDER — TRAMADOL HCL 50 MG PO TABS
50.0000 mg | ORAL_TABLET | Freq: Four times a day (QID) | ORAL | Status: DC | PRN
  Filled 2014-10-17: qty 1

## 2014-10-17 MED ORDER — PANTOPRAZOLE SODIUM 40 MG PO TBEC
40.0000 mg | DELAYED_RELEASE_TABLET | Freq: Two times a day (BID) | ORAL | Status: DC
  Administered 2014-10-17 – 2014-10-22 (×10): 40 mg via ORAL
  Filled 2014-10-17 (×15): qty 1

## 2014-10-17 MED ORDER — DOCUSATE SODIUM 100 MG PO CAPS
100.0000 mg | ORAL_CAPSULE | Freq: Two times a day (BID) | ORAL | Status: DC
  Administered 2014-10-17 – 2014-10-22 (×10): 100 mg via ORAL
  Filled 2014-10-17 (×11): qty 1

## 2014-10-17 MED ORDER — CALCITRIOL 0.25 MCG PO CAPS
0.2500 ug | ORAL_CAPSULE | ORAL | Status: DC
  Administered 2014-10-19 – 2014-10-21 (×2): 0.25 ug via ORAL
  Filled 2014-10-17 (×2): qty 1

## 2014-10-17 MED ORDER — SODIUM CHLORIDE 0.9 % IV SOLN
INTRAVENOUS | Status: DC
  Administered 2014-10-17: 13:00:00 via INTRAVENOUS

## 2014-10-17 MED ORDER — DEXTROSE 5 % IV SOLN
1.0000 g | Freq: Once | INTRAVENOUS | Status: DC
  Filled 2014-10-17: qty 10

## 2014-10-17 MED ORDER — FLUCONAZOLE 100MG IVPB
50.0000 mg | INTRAVENOUS | Status: DC
  Administered 2014-10-17 – 2014-10-20 (×4): 50 mg via INTRAVENOUS
  Filled 2014-10-17 (×6): qty 25

## 2014-10-17 MED ORDER — WARFARIN SODIUM 1 MG PO TABS
1.0000 mg | ORAL_TABLET | Freq: Once | ORAL | Status: AC
  Administered 2014-10-17: 1 mg via ORAL
  Filled 2014-10-17: qty 1

## 2014-10-17 MED ORDER — INSULIN LISPRO 100 UNIT/ML ~~LOC~~ SOLN: 14.0000 [IU] | Freq: Three times a day (TID) | SUBCUTANEOUS | Status: DC

## 2014-10-17 MED ORDER — CETYLPYRIDINIUM CHLORIDE 0.05 % MT LIQD
7.0000 mL | Freq: Two times a day (BID) | OROMUCOSAL | Status: DC
  Administered 2014-10-17 – 2014-10-21 (×9): 7 mL via OROMUCOSAL

## 2014-10-17 MED ORDER — ATORVASTATIN CALCIUM 20 MG PO TABS
20.0000 mg | ORAL_TABLET | Freq: Every day | ORAL | Status: DC
  Administered 2014-10-17 – 2014-10-22 (×6): 20 mg via ORAL
  Filled 2014-10-17 (×5): qty 1
  Filled 2014-10-17: qty 2

## 2014-10-17 MED ORDER — ONDANSETRON HCL 4 MG/2ML IJ SOLN: 4.0000 mg | Freq: Three times a day (TID) | INTRAMUSCULAR | Status: DC | PRN

## 2014-10-17 MED ORDER — LEVOTHYROXINE SODIUM 100 MCG PO TABS
100.0000 ug | ORAL_TABLET | ORAL | Status: DC
  Administered 2014-10-17 – 2014-10-22 (×5): 100 ug via ORAL
  Filled 2014-10-17 (×6): qty 1

## 2014-10-17 MED ORDER — INSULIN DETEMIR 100 UNIT/ML ~~LOC~~ SOLN
9.0000 [IU] | Freq: Every day | SUBCUTANEOUS | Status: DC
  Administered 2014-10-17: 9 [IU] via SUBCUTANEOUS
  Filled 2014-10-17 (×2): qty 0.09

## 2014-10-17 MED ORDER — INSULIN DETEMIR 100 UNIT/ML ~~LOC~~ SOLN
8.0000 [IU] | Freq: Every day | SUBCUTANEOUS | Status: DC
  Administered 2014-10-17: 8 [IU] via SUBCUTANEOUS
  Filled 2014-10-17: qty 0.08

## 2014-10-17 MED ORDER — ACETAMINOPHEN 650 MG RE SUPP: 650.0000 mg | Freq: Four times a day (QID) | RECTAL | Status: DC | PRN

## 2014-10-17 MED ORDER — DEXTROSE 50 % IV SOLN
INTRAVENOUS | Status: AC
  Administered 2014-10-17: 50 mL
  Filled 2014-10-17: qty 50

## 2014-10-17 MED ORDER — METOPROLOL TARTRATE 50 MG PO TABS
50.0000 mg | ORAL_TABLET | Freq: Two times a day (BID) | ORAL | Status: DC
  Administered 2014-10-17: 50 mg via ORAL
  Filled 2014-10-17 (×3): qty 1

## 2014-10-17 MED ORDER — ONDANSETRON HCL 4 MG/2ML IJ SOLN
4.0000 mg | Freq: Once | INTRAMUSCULAR | Status: AC
  Administered 2014-10-17: 4 mg via INTRAVENOUS
  Filled 2014-10-17: qty 2

## 2014-10-17 MED ORDER — ALUM & MAG HYDROXIDE-SIMETH 200-200-20 MG/5ML PO SUSP: 30.0000 mL | Freq: Four times a day (QID) | ORAL | Status: DC | PRN

## 2014-10-17 MED ORDER — SODIUM CHLORIDE 0.9 % IV SOLN
INTRAVENOUS | Status: DC
  Administered 2014-10-17 – 2014-10-19 (×2): via INTRAVENOUS
  Administered 2014-10-20: 1000 mL via INTRAVENOUS

## 2014-10-17 MED ORDER — INSULIN ASPART 100 UNIT/ML ~~LOC~~ SOLN
22.0000 [IU] | Freq: Every day | SUBCUTANEOUS | Status: DC
  Administered 2014-10-17: 22 [IU] via SUBCUTANEOUS

## 2014-10-17 MED ORDER — SODIUM CHLORIDE 0.9 % IV SOLN
INTRAVENOUS | Status: DC
Start: 2014-10-17 — End: 2014-10-17
  Administered 2014-10-17: 08:00:00 via INTRAVENOUS

## 2014-10-17 MED ORDER — INSULIN ASPART 100 UNIT/ML ~~LOC~~ SOLN
14.0000 [IU] | Freq: Every day | SUBCUTANEOUS | Status: DC
  Administered 2014-10-17: 14 [IU] via SUBCUTANEOUS

## 2014-10-17 MED ORDER — SODIUM CHLORIDE 0.9 % IV BOLUS (SEPSIS)
500.0000 mL | Freq: Once | INTRAVENOUS | Status: AC
  Administered 2014-10-17: 500 mL via INTRAVENOUS

## 2014-10-17 MED ORDER — MORPHINE SULFATE 2 MG/ML IJ SOLN
2.0000 mg | Freq: Once | INTRAMUSCULAR | Status: AC
  Administered 2014-10-17: 2 mg via INTRAVENOUS
  Filled 2014-10-17: qty 1

## 2014-10-17 MED ORDER — WARFARIN - PHARMACIST DOSING INPATIENT: Freq: Every day | Status: DC

## 2014-10-17 MED ORDER — SODIUM CHLORIDE 0.9 % IJ SOLN: 3.0000 mL | Freq: Two times a day (BID) | INTRAMUSCULAR | Status: DC

## 2014-10-17 MED ORDER — INSULIN DETEMIR 100 UNIT/ML ~~LOC~~ SOLN: 8.0000 [IU] | Freq: Two times a day (BID) | SUBCUTANEOUS | Status: DC

## 2014-10-17 MED ORDER — VITAMIN B-12 1000 MCG PO TABS
1000.0000 ug | ORAL_TABLET | Freq: Every day | ORAL | Status: DC
  Administered 2014-10-17 – 2014-10-22 (×6): 1000 ug via ORAL
  Filled 2014-10-17 (×6): qty 1

## 2014-10-17 MED ORDER — ONDANSETRON HCL 4 MG PO TABS: 4.0000 mg | ORAL_TABLET | Freq: Four times a day (QID) | ORAL | Status: DC | PRN

## 2014-10-17 MED ORDER — INSULIN ASPART 100 UNIT/ML ~~LOC~~ SOLN: 14.0000 [IU] | Freq: Every day | SUBCUTANEOUS | Status: DC

## 2014-10-17 NOTE — ED Notes (Signed)
Bladder scan showing less than 50 mL

## 2014-10-17 NOTE — Progress Notes (Signed)
ANTIBIOTIC CONSULT NOTE - INITIAL  Pharmacy Consult for Fluconazole Indication: vulvar and skin candidiasis  Allergies  Allergen Reactions  . Tenex [Guanfacine Hcl] Other (See Comments)    hypotension  . Crestor [Rosuvastatin] Other (See Comments)    Myalgia    Patient Measurements:    Vital Signs: Temp: 97.6 F (36.4 C) (10/24 0716) Temp Source: Oral (10/24 0716) BP: 115/58 mmHg (10/24 0800) Pulse Rate: 112 (10/24 0800) Intake/Output from previous day:   Intake/Output from this shift:    Labs:  Recent Labs  10/17/14 0809  WBC 10.0  HGB 11.2*  PLT 186  CREATININE 1.44*   The CrCl is unknown because both a height and weight (above a minimum accepted value) are required for this calculation. No results found for this basename: VANCOTROUGH, VANCOPEAK, VANCORANDOM, GENTTROUGH, GENTPEAK, GENTRANDOM, TOBRATROUGH, TOBRAPEAK, TOBRARND, AMIKACINPEAK, AMIKACINTROU, AMIKACIN,  in the last 72 hours   Microbiology: No results found for this or any previous visit (from the past 720 hour(s)).  Medical History: Past Medical History  Diagnosis Date  . Hypertension   . Hyperlipidemia   . Obesity   . Hypothyroidism   . Vulvar cancer, carcinoma 11/03/2011  . Atrial fibrillation   . Cancer 2007    vulva-invasive well diff squamous cell ca  . CVA (cerebral infarction)   . Dyslipidemia   . History of radiation therapy 10/20/2013-12/01/2013    60 gray to perineum area and lower pelvis  . Diabetes mellitus 05-04-14    "Diabetic coma" 1 week ago -tx. by EMT At residence until pt. feeling improved and stable.  . Shortness of breath     occ. with coughing up phelgm usually mornings  . 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 Abnormal voice 05-04-14    "raspy voice" with inspiratory effort-Dr. Lissa Hoard aware 05-04-14  . Stroke 05/2000    right brain CVA pre H&P 2001-  . Anemia   . Macular degeneration      Assessment: 3 yoF with recurrent vulvar cancer s/p  multiple surgeries presents with 48 hour h/o N/V.  Pt has an ongoing vulvar wound and indwelling foley and colostomy.  Pharmacy consulted to dose fluconazole for vulvar and skin candidiasis.  Noted history of atrial fibrillation on chronic warfarin (drug interaction with fluconazole).  Tmax: afebrile WBC: WNL Renal: SCr 1.44, CrCl~26 ml/min (CG) Urine culture sent.  Goal of Therapy:  Doses adjusted per renal function Eradication of infection  Plan:  Fluconazole 50 mg IV q24h.  Dose adjusted for CrCl<50 ml/min.  Will consider switching to PO once patient's symptoms of N/V improve.  Hershal Coria 10/17/2014,10:50 AM

## 2014-10-17 NOTE — ED Notes (Signed)
Bed: AL93 Expected date:  Expected time:  Means of arrival:  Comments: EMS, 39 F, N/V

## 2014-10-17 NOTE — H&P (Signed)
History and Physical:    [name] NLG:[id number] DOB: 1925-12-27 DOA: 10/17/2014  Referring physician: Dr. Audie Pinto PCP: Elayne Snare, MD   Chief Complaint: 48 hour history of N/V.  History of Present Illness:   [name] is an 80 y.o. 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  The patient has an ongoing vulvar wound as well as a chronic indwelling foley and colostomy. She also has a history of atrial fibrillation on chronic coumadin. The patient was brought to the hospital via EMS from home after having a 2 day history of N/V with the inability to keep down any food/liquids.  The patient has a colostomy and her ostomy output has been more loose than usual.  No hematemesis, or black/bloody ostomy drainage.    ROS:   Constitutional: No fever, no chills;  Appetite normal; No weight loss, no weight gain, + fatigue.  HEENT: No blurry vision, no diplopia, no pharyngitis, no dysphagia CV: No chest pain, no palpitations, no PND, no orthopnea, no edema.  Resp: No SOB, no cough, no pleuritic pain. GI: + nausea, + vomiting, no diarrhea, no melena, no hematochezia, no constipation, no abdominal pain, +hiccups.  GU: No dysuria, no hematuria, no frequency, no urgency. MSK: no myalgias, no arthralgias.  Neuro:  No headache, no focal neurological deficits, no history of seizures.  Psych: No depression, no anxiety.  Endo: No heat intolerance, no cold intolerance, no polyuria, no polydipsia  Skin: + rashes, no skin lesions.  Heme: No easy bruising.  Travel history: No recent travel.   Past Medical History:   Past Medical History  Diagnosis Date  . Hypertension   . Hyperlipidemia   . Obesity   . Hypothyroidism   . Vulvar cancer, carcinoma 11/03/2011  . Atrial fibrillation   . Cancer  2007    vulva-invasive well diff squamous cell ca  . CVA (cerebral infarction)   . Dyslipidemia   . History of radiation therapy 10/20/2013-12/01/2013    60 gray to perineum area and lower pelvis  . Diabetes mellitus 05-04-14    "Diabetic coma" 1 week ago -[facility] until pt. feeling improved and stable.  . Shortness of breath     occ. with coughing up phelgm usually mornings  . 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 Abnormal voice 05-04-14    "raspy voice" with inspiratory effort-Dr. Lissa Hoard aware 05-04-14  . Stroke 05/2000    right brain CVA pre H&P 2001-  . Anemia   . Macular degeneration   . Postoperative wound infection 07/01/2014  . Community acquired pneumonia 08/09/2012  . Corneal thinning 11/08/2012  . GI bleed 05/16/2014  . PUD (peptic ulcer disease) 05/26/2014  . Type II or unspecified type diabetes mellitus with renal manifestations, uncontrolled(250.42) 08/10/2012    Past Surgical History:   Past Surgical History  Procedure Laterality Date  . Vulvar lesion removal  2012  . Breast surgery      Lumpectomy  . Dilation and curettage of uterus    . Cystourethroplasty / ureteroneocystostomy    . Radical vulvectomy    . Lymphadenectomy  2007    R inguinal femoral  . Radical vulvectomy  02/27/12  . Vulvar lesion removal  02/27/2012    Procedure: VULVAR LESION;  Surgeon: Alvino Chapel, MD;  Location: [facility];  Service: Gynecology;  Laterality: N/A;  . Right carotid endarterctomy    . Left eye surgery      , permanently stitched eye together  . Vulvectomy N/A 05/12/2014    Procedure: RADICAL VULVECTOMY WITH COLOSTOMY;  Surgeon: Alvino Chapel, MD;  Location: [facility];  Service: Gynecology;  Laterality: N/A;  . Laparotomy N/A 05/12/2014    Procedure: EXPLORATORY LAPAROTOMY, TOTAL ABDO MINAL HYSTERECTOMY AND BILATERAL SALPINGOOPHERECTOMY;  Surgeon: Alvino Chapel, MD;  Location: [facility];  Service: Gynecology;  Laterality: N/A;  .  Esophagogastroduodenoscopy N/A 05/20/2014    Procedure: ESOPHAGOGASTRODUODENOSCOPY (EGD);  Surgeon: Jeryl Columbia, MD;  Location: [facility];  Service: Endoscopy;  Laterality: N/A;    Social History:   History   Social History  . Marital Status: Married    Spouse Name: N/A    Number of Children: N/A  . Years of Education: N/A   Occupational History  . Not on file.   Social History Main Topics  . Smoking status: Former Smoker -- 0.25 packs/day for 18 years    Types: Cigarettes    Quit date: 11/03/1967  . Smokeless tobacco: Never Used  . Alcohol Use: No  . Drug Use: No  . Sexual Activity: Not Currently   Other Topics Concern  . Not on file   Social History Narrative   The patient lives with her husband.    Family history:   Family History  Problem Relation Age of Onset  . Diabetes type II    . Coronary artery disease Mother   . Lung cancer Brother   . Diabetes type II Brother     Allergies   Tenex and Crestor  Current Medications:   Prior to Admission medications   Medication Sig Start Date End Date Taking? Authorizing Provider  atorvastatin (LIPITOR) 20 MG tablet Take 20 mg by mouth daily.   Yes Historical Provider, MD  calcitRIOL (ROCALTROL) 0.25 MCG capsule Take 0.25 mcg by mouth every Monday, Wednesday, and Friday.   Yes Historical Provider, MD  cholecalciferol (VITAMIN D) 1000 UNITS tablet Take 1,000 Units by mouth daily.   Yes Historical Provider, MD  diltiazem (CARDIZEM CD) 180 MG 24 hr capsule Take 180 mg by mouth daily.  08/10/14  Yes Historical Provider, MD  docusate sodium (COLACE) 100 MG capsule Take 100 mg by mouth 2 (two) times daily.   Yes Historical Provider, MD  furosemide (LASIX) 20 MG tablet Take 20 mg by mouth daily.   Yes Historical Provider, MD  insulin detemir (LEVEMIR) 100 UNIT/ML injection Inject 8-9 Units into the skin 2 (two) times daily. Inject 9 units into the skin every morning and 8 units every evening. 08/13/12  Yes Sorin June Leap, MD   insulin lispro (HUMALOG) 100 UNIT/ML injection Inject 14-22 Units into the skin 3 (three) times daily before meals. Inject 14 units into the skin with breakfast, 14 units with lunch, and 22 units with supper.   Yes Historical Provider, MD  levothyroxine (SYNTHROID, LEVOTHROID) 100 MCG tablet Take 100 mcg by mouth as directed. Every day except sunday   Yes Historical Provider, MD  metoprolol (LOPRESSOR) 50 MG tablet Take 50 mg by mouth 2 (two) times daily.   Yes Historical Provider, MD  pantoprazole (PROTONIX) 40 MG tablet Take 40 mg by mouth 2 (two) times daily.   Yes Historical Provider, MD  polyethylene glycol (MIRALAX / GLYCOLAX) packet Take 17 g by mouth daily.  Yes Historical Provider, MD  potassium chloride (K-DUR) 10 MEQ tablet Take 10 mEq by mouth daily.   Yes Historical Provider, MD  traMADol (ULTRAM) 50 MG tablet Take 50 mg by mouth every 6 (six) hours as needed for moderate pain.   Yes Historical Provider, MD  vitamin B-12 (CYANOCOBALAMIN) 1000 MCG tablet Take 1,000 mcg by mouth daily.   Yes Historical Provider, MD  warfarin (COUMADIN) 5 MG tablet Take 5 mg by mouth daily.    Yes Historical Provider, MD    Physical Exam:   Filed Vitals:   10/17/14 0905 10/17/14 1215 10/17/14 1337 10/17/14 1349  BP:  114/69 121/90   Pulse:  95  125  Temp:  98 F (36.7 C)    TempSrc:  Oral    Resp:  18    Height:  5\' 4"  (1.626 m)    Weight:  57.879 kg (127 lb 9.6 oz)    SpO2: 88% 100%       Physical Exam: Blood pressure 121/90, pulse 125, temperature 98 F (36.7 C), temperature source Oral, resp. rate 18, height 5\' 4"  (1.626 m), weight 57.879 kg (127 lb 9.6 oz), SpO2 100.00%. Gen: No acute distress. Head: Normocephalic, atraumatic. Eyes: PERRL, EOMI, sclerae nonicteric. Left eyelid sewed shut at the lateral margin. Mouth: Oropharynx with dry mucous membranes and erythema to the upper palate. Neck: Supple, no thyromegaly, no lymphadenopathy, no jugular venous distention. Chest: Lungs  diminished in the bases but otherwise clear. CV: Heart sounds are irregularly irregular and tachycardic. Abdomen: Soft, nontender, nondistended with normal active bowel sounds. Genitourinary: Patient is status post vulvectomy with wet to dry dressings in the perineal area posterior to the vulva.  Candida rash noted in the perineal area. Extremities: Extremities are without clubbing, edema, or cyanosis. Onychomycosis of the nails bilaterally. Skin: Warm and dry. Non-pruritic rash to the lower extremities and arms bilaterally. Neuro: Alert and oriented times 3; cranial nerves II through XII grossly intact. Psych: Mood and affect anxious.   Data Review:    Labs: Basic Metabolic Panel:  Recent Labs Lab 10/17/14 0809  NA 139  K 4.5  CL 102  CO2 23  GLUCOSE 200*  BUN 26*  CREATININE 1.44*  CALCIUM 8.3*   Liver Function Tests:4245  Recent Labs Lab 10/17/14 0809  AST 9  ALT <5  ALKPHOS 68  BILITOT 0.7  PROT 5.8*  ALBUMIN 2.4*   CBC:  Recent Labs Lab 10/17/14 0809  WBC 10.0  NEUTROABS 8.7*  HGB 11.2*  HCT 34.9*  MCV 98.0  PLT 186    Radiographic Studies: Dg Abd Acute W/chest  10/17/2014   CLINICAL DATA:  Two days of vomiting. Diarrhea last night. No chest complaints per patient. History vulvular carcinoma, diabetes and atrial fibrillation. Ex-smoker.  EXAM: ACUTE ABDOMEN SERIES (ABDOMEN 2 VIEW & CHEST 1 VIEW)  COMPARISON:  05/16/2014.  FINDINGS: No bowel dilation is seen to suggest obstruction or generalized adynamic ileus. There is no free air. There is a round opacity that projects in the left mid abdomen which is of unclear etiology. This could reflect a colostomy device.  Her vascular calcifications in the pelvis and in the proximal thighs. Soft tissues are otherwise unremarkable.  Cardiac silhouette is top-normal in size. There are no acute findings in the lungs.  The bony structures are diffusely demineralized.  IMPRESSION: 1. No acute findings. No obstruction,  generalized adynamic ileus or free air. 2. No acute cardiopulmonary disease.   Electronically Signed   By: Shanon Brow  Ormond M.D.   On: 10/17/2014 09:10    EKG: Independently reviewed. Afib with RVR.   Assessment/Plan:   Principal Problem:   Vomiting  Hydrate and provide antiemetics as needed.  Acute abdominal series unremarkable.  If symptoms do not resolve, consider abdominal imaging with a CT scan.  Active Problems:   Candidiasis of perineum  Diflucan 50 mg IV daily.    UTI  The patient's catheter has been in place for 3 months. Repeat urine cultures were sent after the catheter was replaced.  Continue empiric Rocephin.    Vulvar cancer, carcinoma  Wound care RN to evaluate chronic wound in perianal area.    Hypothyroidism  Continue Synthroid.    Dyslipidemia  Continue Lipitor.    Essential hypertension   Chronic kidney disease, stage III (moderate)  Continue calcitriol and vitamin D.    Aortic stenosis  Hold Lasix for now.    Atrial fibrillation with RVR / chronic anticoagulation  Continue Cardizem and metoprolol. If ongoing nausea/vomiting, may need to change to IV route.  Coumadin per pharmacy dosing.    Type 2 diabetes mellitus with renal manifestations  Continue home insulin regimen with Levemir and lispro.  Check CBGs q. a.c. and at bedtime.    DVT prophylaxis  On chronic Coumadin.  Code Status: Full. Family Communication: Husband, son and daughter-in-law at the bedside. Disposition Plan: Home when stable.  Time spent: 75 minutes.  RAMA,CHRISTINA [facility] Pager [phone] Cell: [phone]   If 7PM-7AM, please contact night-coverage [email or web address] Password [email or web address] 10/17/2014, 4:07 PM

## 2014-10-17 NOTE — Progress Notes (Signed)
ANTICOAGULATION CONSULT NOTE - Initial Consult  Pharmacy Consult for Warfarin Indication: atrial fibrillation  Allergies  Allergen Reactions  . Tenex [Guanfacine Hcl] Other (See Comments)    hypotension  . Crestor [Rosuvastatin] Other (See Comments)    Myalgia    Patient Measurements: Height: 5\' 4"  (162.6 cm) Weight: 127 lb 9.6 oz (57.879 kg) IBW/kg (Calculated) : 54.7  Vital Signs: Temp: 98 F (36.7 C) (10/24 1215) Temp Source: Oral (10/24 1215) BP: 114/69 mmHg (10/24 1215) Pulse Rate: 95 (10/24 1215)  Labs:  Recent Labs  10/17/14 0809  HGB 11.2*  HCT 34.9*  PLT 186  LABPROT 25.7*  INR 2.32*  CREATININE 1.44*    Estimated Creatinine Clearance: 24.2 ml/min (by C-G formula based on Cr of 1.44).   Medical History: Past Medical History  Diagnosis Date  . Hypertension   . Hyperlipidemia   . Obesity   . Hypothyroidism   . Vulvar cancer, carcinoma 11/03/2011  . Atrial fibrillation   . Cancer 2007    vulva-invasive well diff squamous cell ca  . CVA (cerebral infarction)   . Dyslipidemia   . History of radiation therapy 10/20/2013-12/01/2013    60 gray to perineum area and lower pelvis  . Diabetes mellitus 05-04-14    "Diabetic coma" 1 week ago -[facility] until pt. feeling improved and stable.  . Shortness of breath     occ. with coughing up phelgm usually mornings  . 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 Abnormal voice 05-04-14    "raspy voice" with inspiratory effort-Dr. Lissa Hoard aware 05-04-14  . Stroke 05/2000    right brain CVA pre H&P 2001-  . Anemia   . Macular degeneration     Assessment: 67 yoF with recurrent vulvar cancer s/p multiple surgeries presents with 48 hour h/o N/V. Pt has atrial fibrillation on chronic warfarin.  Pharmacy consulted to manage warfarin inpatient.  INR therapeutic on admission.  PTA dose reported as 5 mg daily.  Last dose past week.  Patient unable to further clarify which day this past week and  family now unavailable.   INR 2.32  CBC: Hgb 11.2, plts 186K  Drug interaction: fluconazole  Goal of Therapy:  INR 2-3   Plan:  1.  Warfarin 1 mg once today.  Providing a low dose since concerned with history of N/V and reduced appetite and drug interaction with fluconazole.  Would like to see INR trend in AM before administering higher dose. 2.  Daily INR.  Hershal Coria 10/17/2014,12:51 PM

## 2014-10-17 NOTE — ED Notes (Signed)
Pt comes from home by EMS for n/v and loss of appetite since yesterday morning.  Pt has colostomy bag from previous colon cancer and foley cath already in place prior to arrival.  Pt has bed sore on her buttock area.

## 2014-10-17 NOTE — ED Provider Notes (Signed)
CSN: [id number]     Arrival date & time 10/17/14  0703 History   First MD Initiated Contact with Patient 10/17/14 [phone]     Chief Complaint  Patient presents with  . Nausea  . Emesis      HPI  Pt comes from home by EMS for n/v and loss of appetite since yesterday morning. Pt has colostomy bag from previous colon cancer and foley cath already in place prior arrival.  Patient has had no fever or chills.  [facility] from [facility].  Has had decreased output in her colostomy bag.      Past Medical History  Diagnosis Date  . Hypertension   . Hyperlipidemia   . Obesity   . Hypothyroidism   . Vulvar cancer, carcinoma 11/03/2011  . Atrial fibrillation   . Cancer 2007    vulva-invasive well diff squamous cell ca  . CVA (cerebral infarction)   . Dyslipidemia   . History of radiation therapy 10/20/2013-12/01/2013    60 gray to perineum area and lower pelvis  . Diabetes mellitus 05-04-14    "Diabetic coma" 1 week ago -tx. by [facility] until pt. feeling improved and stable.  . Shortness of breath     occ. with coughing up phelgm usually mornings  . 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 Abnormal voice 05-04-14    "raspy voice" with inspiratory effort-Dr. Lissa Hoard aware 05-04-14  . Stroke 05/2000    right brain CVA pre H&P 2001-  . Anemia    Past Surgical History  Procedure Laterality Date  . Vulvar lesion removal  2012  . Breast surgery      Lumpectomy  . Dilation and curettage of uterus    . Cystourethroplasty / ureteroneocystostomy    . Radical vulvectomy    . Lymphadenectomy  2007    R inguinal femoral  . Radical vulvectomy  02/27/12  . Vulvar lesion removal  02/27/2012    Procedure: VULVAR LESION;  Surgeon: Alvino Chapel, MD;  Location: [facility];  Service: Gynecology;  Laterality: N/A;  . Right carotid endarterctomy    . Left eye surgery      , permanently stitched eye together  . Vulvectomy N/A 05/12/2014    Procedure:  RADICAL VULVECTOMY WITH COLOSTOMY;  Surgeon: Alvino Chapel, MD;  Location: [facility];  Service: Gynecology;  Laterality: N/A;  . Laparotomy N/A 05/12/2014    Procedure: EXPLORATORY LAPAROTOMY, TOTAL ABDO MINAL HYSTERECTOMY AND BILATERAL SALPINGOOPHERECTOMY;  Surgeon: Alvino Chapel, MD;  Location: [facility];  Service: Gynecology;  Laterality: N/A;  . Esophagogastroduodenoscopy N/A 05/20/2014    Procedure: ESOPHAGOGASTRODUODENOSCOPY (EGD);  Surgeon: Jeryl Columbia, MD;  Location: [facility];  Service: Endoscopy;  Laterality: N/A;   Family History  Problem Relation Age of Onset  . Diabetes type II    . Coronary artery disease Mother   . Lung cancer Brother   . Diabetes type II Brother    History  Substance Use Topics  . Smoking status: Former Smoker -- 0.25 packs/day for 18 years    Types: Cigarettes    Quit date: 11/03/1967  . Smokeless tobacco: Never Used  . Alcohol Use: No   OB History   Grav Para Term Preterm Abortions TAB SAB Ect Mult Living                 Review of Systems  All other systems reviewed and are negative  Allergies  Tenex and  Crestor  Home Medications   Prior to Admission medications   Medication Sig Start Date End Date Taking? Authorizing Provider  atorvastatin (LIPITOR) 20 MG tablet Take 20 mg by mouth daily.   Yes Historical Provider, MD  calcitRIOL (ROCALTROL) 0.25 MCG capsule Take 0.25 mcg by mouth every Monday, Wednesday, and Friday.   Yes Historical Provider, MD  cholecalciferol (VITAMIN D) 1000 UNITS tablet Take 1,000 Units by mouth daily.   Yes Historical Provider, MD  diltiazem (CARDIZEM CD) 180 MG 24 hr capsule Take 180 mg by mouth daily.  08/10/14  Yes Historical Provider, MD  docusate sodium (COLACE) 100 MG capsule Take 100 mg by mouth 2 (two) times daily.   Yes Historical Provider, MD  furosemide (LASIX) 20 MG tablet Take 20 mg by mouth daily.   Yes Historical Provider, MD  insulin detemir (LEVEMIR) 100 UNIT/ML injection Inject 8-9  Units into the skin 2 (two) times daily. Inject 9 units into the skin every morning and 8 units every evening. 08/13/12  Yes Sorin June Leap, MD  insulin lispro (HUMALOG) 100 UNIT/ML injection Inject 14-22 Units into the skin 3 (three) times daily before meals. Inject 14 units into the skin with breakfast, 14 units with lunch, and 22 units with supper.   Yes Historical Provider, MD  levothyroxine (SYNTHROID, LEVOTHROID) 100 MCG tablet Take 100 mcg by mouth as directed. Every day except sunday   Yes Historical Provider, MD  metoprolol (LOPRESSOR) 50 MG tablet Take 50 mg by mouth 2 (two) times daily.   Yes Historical Provider, MD  pantoprazole (PROTONIX) 40 MG tablet Take 40 mg by mouth 2 (two) times daily.   Yes Historical Provider, MD  polyethylene glycol (MIRALAX / GLYCOLAX) packet Take 17 g by mouth daily.   Yes Historical Provider, MD  potassium chloride (K-DUR) 10 MEQ tablet Take 10 mEq by mouth daily.   Yes Historical Provider, MD  traMADol (ULTRAM) 50 MG tablet Take 50 mg by mouth every 6 (six) hours as needed for moderate pain.   Yes Historical Provider, MD  vitamin B-12 (CYANOCOBALAMIN) 1000 MCG tablet Take 1,000 mcg by mouth daily.   Yes Historical Provider, MD  warfarin (COUMADIN) 5 MG tablet Take 5 mg by mouth daily.    Yes Historical Provider, MD   BP 115/58  Pulse 112  Temp(Src) 97.6 F (36.4 C) (Oral)  Resp 14  SpO2 88% Physical Exam Physical Exam  Nursing note and vitals reviewed. Constitutional: She is oriented to person, place, and time. She appears well-developed and well-nourished. No distress.  HENT:  Head: Normocephalic and atraumatic.  Neck: Normal range of motion.  Cardiovascular: Normal rate and intact distal pulses.   Pulmonary/Chest: No respiratory distress.  Abdominal: Normal appearance. She exhibits no distension.  patient has colostomy left lower quadrant.  There is small amount of feces in the bag.  No generalized tenderness noted. Musculoskeletal: Normal range of  motion.  Neurological: She is alert and oriented to person, place, and time. No cranial nerve deficit.  Skin: Skin is warm and dry. No rash noted.  Psychiatric: She has a normal mood and affect. Her behavior is normal.   ED Course  Procedures (including critical care time) Medications  0.9 %  sodium chloride infusion ( Intravenous New Bag/Given 10/17/14 0815)  cefTRIAXone (ROCEPHIN) 1 g in dextrose 5 % 50 mL IVPB (not administered)  ondansetron (ZOFRAN) injection 4 mg (4 mg Intravenous Given 10/17/14 0754)  sodium chloride 0.9 % bolus 500 mL (0 mLs Intravenous Stopped  10/17/14 0813)    Labs Review Labs Reviewed  COMPREHENSIVE METABOLIC PANEL - Abnormal; Notable for the following:    Glucose, Bld 200 (*)    BUN 26 (*)    Creatinine, Ser 1.44 (*)    Calcium 8.3 (*)    Total Protein 5.8 (*)    Albumin 2.4 (*)    GFR calc non Af Amer 32 (*)    GFR calc Af Amer 37 (*)    All other components within normal limits  CBC WITH DIFFERENTIAL - Abnormal; Notable for the following:    RBC 3.56 (*)    Hemoglobin 11.2 (*)    HCT 34.9 (*)    RDW 15.7 (*)    Neutrophils Relative % 87 (*)    Neutro Abs 8.7 (*)    Lymphocytes Relative 6 (*)    Lymphs Abs 0.6 (*)    All other components within normal limits  URINALYSIS, ROUTINE W REFLEX MICROSCOPIC - Abnormal; Notable for the following:    APPearance TURBID (*)    Hgb urine dipstick TRACE (*)    Protein, ur 100 (*)    Leukocytes, UA LARGE (*)    All other components within normal limits  PROTIME-INR - Abnormal; Notable for the following:    Prothrombin Time 25.7 (*)    INR 2.32 (*)    All other components within normal limits  URINE MICROSCOPIC-ADD ON - Abnormal; Notable for the following:    Bacteria, UA MANY (*)    Crystals TRIPLE PHOSPHATE CRYSTALS (*)    All other components within normal limits  URINE CULTURE  LACTIC ACID, PLASMA    Imaging Review Dg Abd Acute W/chest  10/17/2014   CLINICAL DATA:  Two days of vomiting.  Diarrhea last night. No chest complaints per patient. History vulvular carcinoma, diabetes and atrial fibrillation. Ex-smoker.  EXAM: ACUTE ABDOMEN SERIES (ABDOMEN 2 VIEW & CHEST 1 VIEW)  COMPARISON:  05/16/2014.  FINDINGS: No bowel dilation is seen to suggest obstruction or generalized adynamic ileus. There is no free air. There is a round opacity that projects in the left mid abdomen which is of unclear etiology. This could reflect a colostomy device.  Her vascular calcifications in the pelvis and in the proximal thighs. Soft tissues are otherwise unremarkable.  Cardiac silhouette is top-normal in size. There are no acute findings in the lungs.  The bony structures are diffusely demineralized.  IMPRESSION: 1. No acute findings. No obstruction, generalized adynamic ileus or free air. 2. No acute cardiopulmonary disease.   Electronically Signed   By: Lajean Manes M.D.   On: 10/17/2014 09:10     EKG Interpretation   Date/Time:  Saturday October 17 2014 07:21:32 EDT Ventricular Rate:  124 PR Interval:    QRS Duration: 74 QT Interval:  359 QTC Calculation: 516 R Axis:   73 Text Interpretation:  Atrial fibrillation with rapid ventricular response  the rvr is new Low voltage, precordial leads ST depression, probably rate  related Prolonged QT interval Reconfirmed by Nevan Creighton  MD, Manasi Dishon ([id number]) on  10/17/2014 7:39:46 AM     Patient has findings consistent with dehydration.  We'll admit to hospital. MDM   Final diagnoses:  Vomiting  Dehydration  UTI (lower urinary tract infection)        Dot Lanes, MD 10/17/14 [phone]

## 2014-10-17 NOTE — ED Notes (Signed)
MD at bedside. 

## 2014-10-17 NOTE — ED Notes (Signed)
Called floor to give report. RN unavailable. Awaiting a call back.

## 2014-10-18 ENCOUNTER — Encounter (HOSPITAL_COMMUNITY): Payer: Self-pay | Admitting: Internal Medicine

## 2014-10-18 DIAGNOSIS — Z7901 Long term (current) use of anticoagulants: Secondary | ICD-10-CM

## 2014-10-18 DIAGNOSIS — E162 Hypoglycemia, unspecified: Secondary | ICD-10-CM

## 2014-10-18 DIAGNOSIS — E876 Hypokalemia: Secondary | ICD-10-CM | POA: Diagnosis present

## 2014-10-18 DIAGNOSIS — T68XXXA Hypothermia, initial encounter: Secondary | ICD-10-CM | POA: Diagnosis not present

## 2014-10-18 DIAGNOSIS — N183 Chronic kidney disease, stage 3 unspecified: Secondary | ICD-10-CM | POA: Diagnosis present

## 2014-10-18 DIAGNOSIS — T68XXXD Hypothermia, subsequent encounter: Secondary | ICD-10-CM

## 2014-10-18 DIAGNOSIS — E860 Dehydration: Secondary | ICD-10-CM

## 2014-10-18 LAB — BASIC METABOLIC PANEL
Anion gap: 11 (ref 5–15)
Anion gap: 12 (ref 5–15)
BUN: 25 mg/dL — ABNORMAL HIGH (ref 6–23)
BUN: 26 mg/dL — AB (ref 6–23)
CHLORIDE: 105 meq/L (ref 96–112)
CHLORIDE: 109 meq/L (ref 96–112)
CO2: 23 mEq/L (ref 19–32)
CO2: 24 mEq/L (ref 19–32)
Calcium: 7.7 mg/dL — ABNORMAL LOW (ref 8.4–10.5)
Calcium: 7.9 mg/dL — ABNORMAL LOW (ref 8.4–10.5)
Creatinine, Ser: 1.38 mg/dL — ABNORMAL HIGH (ref 0.50–1.10)
Creatinine, Ser: 1.47 mg/dL — ABNORMAL HIGH (ref 0.50–1.10)
GFR calc Af Amer: 36 mL/min — ABNORMAL LOW (ref >90)
GFR calc Af Amer: 39 mL/min — ABNORMAL LOW (ref >90)
GFR calc non Af Amer: 31 mL/min — ABNORMAL LOW (ref >90)
GFR calc non Af Amer: 34 mL/min — ABNORMAL LOW (ref >90)
Glucose, Bld: 110 mg/dL — ABNORMAL HIGH (ref 70–99)
Glucose, Bld: 206 mg/dL — ABNORMAL HIGH (ref 70–99)
POTASSIUM: 3.7 meq/L (ref 3.7–5.3)
Potassium: 3.5 mEq/L — ABNORMAL LOW (ref 3.7–5.3)
Sodium: 140 mEq/L (ref 137–147)
Sodium: 144 mEq/L (ref 137–147)

## 2014-10-18 LAB — CBC
HCT: 30.5 % — ABNORMAL LOW (ref 36.0–46.0)
Hemoglobin: 9.8 g/dL — ABNORMAL LOW (ref 12.0–15.0)
MCH: 31.4 pg (ref 26.0–34.0)
MCHC: 32.1 g/dL (ref 30.0–36.0)
MCV: 97.8 fL (ref 78.0–100.0)
Platelets: 181 10*3/uL (ref 150–400)
RBC: 3.12 MIL/uL — AB (ref 3.87–5.11)
RDW: 16 % — ABNORMAL HIGH (ref 11.5–15.5)
WBC: 7.1 10*3/uL (ref 4.0–10.5)

## 2014-10-18 LAB — GLUCOSE, CAPILLARY
GLUCOSE-CAPILLARY: 189 mg/dL — AB (ref 70–99)
GLUCOSE-CAPILLARY: 28 mg/dL — AB (ref 70–99)
Glucose-Capillary: 115 mg/dL — ABNORMAL HIGH (ref 70–99)
Glucose-Capillary: 116 mg/dL — ABNORMAL HIGH (ref 70–99)
Glucose-Capillary: 140 mg/dL — ABNORMAL HIGH (ref 70–99)
Glucose-Capillary: 173 mg/dL — ABNORMAL HIGH (ref 70–99)
Glucose-Capillary: 223 mg/dL — ABNORMAL HIGH (ref 70–99)
Glucose-Capillary: 244 mg/dL — ABNORMAL HIGH (ref 70–99)
Glucose-Capillary: 88 mg/dL (ref 70–99)

## 2014-10-18 LAB — URINE CULTURE
Colony Count: 25000
Special Requests: NORMAL

## 2014-10-18 LAB — PROTIME-INR
INR: 3 — ABNORMAL HIGH (ref 0.00–1.49)
Prothrombin Time: 31.3 seconds — ABNORMAL HIGH (ref 11.6–15.2)

## 2014-10-18 LAB — MRSA PCR SCREENING: MRSA by PCR: NEGATIVE

## 2014-10-18 MED ORDER — INSULIN DETEMIR 100 UNIT/ML ~~LOC~~ SOLN
5.0000 [IU] | Freq: Two times a day (BID) | SUBCUTANEOUS | Status: DC
  Administered 2014-10-18 – 2014-10-22 (×9): 5 [IU] via SUBCUTANEOUS
  Filled 2014-10-18 (×12): qty 0.05

## 2014-10-18 MED ORDER — POTASSIUM CHLORIDE 2 MEQ/ML IV SOLN
INTRAVENOUS | Status: DC
  Administered 2014-10-18 – 2014-10-22 (×4): via INTRAVENOUS
  Filled 2014-10-18 (×10): qty 1000

## 2014-10-18 MED ORDER — INSULIN ASPART 100 UNIT/ML ~~LOC~~ SOLN
0.0000 [IU] | Freq: Three times a day (TID) | SUBCUTANEOUS | Status: DC
  Administered 2014-10-18: 5 [IU] via SUBCUTANEOUS
  Administered 2014-10-18: 3 [IU] via SUBCUTANEOUS
  Administered 2014-10-19: 2 [IU] via SUBCUTANEOUS
  Administered 2014-10-19 – 2014-10-21 (×6): 3 [IU] via SUBCUTANEOUS
  Administered 2014-10-22: 5 [IU] via SUBCUTANEOUS
  Administered 2014-10-22: 3 [IU] via SUBCUTANEOUS

## 2014-10-18 MED ORDER — INSULIN ASPART 100 UNIT/ML ~~LOC~~ SOLN: 0.0000 [IU] | Freq: Every day | SUBCUTANEOUS | Status: DC

## 2014-10-18 MED ORDER — POTASSIUM CHLORIDE 10 MEQ/100ML IV SOLN
10.0000 meq | INTRAVENOUS | Status: AC
  Administered 2014-10-18 (×4): 10 meq via INTRAVENOUS
  Filled 2014-10-18 (×4): qty 100

## 2014-10-18 MED ORDER — METOPROLOL TARTRATE 50 MG PO TABS
50.0000 mg | ORAL_TABLET | Freq: Two times a day (BID) | ORAL | Status: DC
  Administered 2014-10-18 – 2014-10-22 (×6): 50 mg via ORAL
  Filled 2014-10-18 (×9): qty 1
  Filled 2014-10-18: qty 2
  Filled 2014-10-18: qty 1

## 2014-10-18 NOTE — Progress Notes (Signed)
Paged NP about 1430 Mrs Haynie, due to concern for patient. Her CBG dropped to 22, gave D50, pt CBG came up to 244. Recheck sugar it was 173 within 30 minutes of checking her CBG. Patient was alert after we gave her the D50. She was still very cold to touch, pt was in no pain. Could not get a temp due to patient couldn't shut mouth she is a mouth breather and axillary wouldn't register under arm with thermometer. NP came up to address the situation and they decided to transfer her stepdown to put a bear hugger on her and to monitor her closely.

## 2014-10-18 NOTE — Progress Notes (Addendum)
RN paged about pt being too cold. Attempted to obtain temp multiple times but couldn't register a temp due to hypothermia. RN states pt earlier with confusion, and hypoglycemia episode (CBG 22), which resolved after 1 amp of D50.  Pulse 57, BP 98/57, O2 sat 100% on 2L [place],   General: AO x3, in NAD Chest- CTAB, no rales, ronchi, or wheeze CV-regular rate, irregular rhythm.  Abdomen-+BS, soft, nontender, nondistended, with colostomy bag Skin-cool and dry  Hypothermia Transferred to SDU warming blanket Held Metoprolol due to decreased HR. Ordered blood cultures.  Hypoglycemia Resolved- CBG 115  started on NS w/ D5 and KCL 1- meq at 148ml/hr   Lacy Duverney [facility]

## 2014-10-18 NOTE — Progress Notes (Signed)
Unable to get oral or axillary temp. Cannot obtain rectal temp d/t type of surgery pt has had. Pt has chronic foley cath in but is not a temp foley. Called md on call to inform of this awaiting call back. Also paged and asked if md on call would like RN to place temp foley awaiting call back. Will continue to monitor.

## 2014-10-18 NOTE — Progress Notes (Signed)
Progress Note   DORALENE GLANZ FWY:[id number] DOB: 1926-05-17 DOA: 10/17/2014 PCP: Elayne Snare, MD   Brief Narrative:   [name] is an 80 y.o. 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 The patient has an ongoing vulvar wound as well as a chronic indwelling foley and colostomy. She also has a history of atrial fibrillation on chronic coumadin. The patient was admitted on 10/17/14 after having a 2 day history of N/V with the inability to keep down any food/liquids. Her foley was exchanged in the ER and she was admitted to the floor with a presumed UTI.  Overnight on 10/18/14, the patient developed hypothermia and hypoglycemia necessitating transfer to the SDU for closer monitoring.  Assessment/Plan:   Principal Problem:  Vomiting  Continue to hydrate and provide antiemetics as needed.  Acute abdominal series unremarkable on admission.  If symptoms do not resolve, consider abdominal imaging with a CT scan.  Active Problems:  Stage III CKD  GFR in the 30s at baseline.  Baseline creatinine 1.2-1.3.  Hypokalemia  Will give 4 runs of potassium.  Candidiasis of perineum  Continue Diflucan 50 mg IV daily.  Watch for myopathy since on statin.  UTI  The patient's catheter has been in place for 3 months. Repeat urine cultures were sent after the catheter was replaced.  Continue empiric Rocephin.  Vulvar cancer, carcinoma  Wound care RN to evaluate chronic wound in perianal area.  Hypothyroidism  Continue Synthroid.  Dyslipidemia  Continue Lipitor.  Essential hypertension   BP soft.  Continue Metoprolol and Cardizem as BP tolerates.  Hold Lasix.  Chronic kidney disease, stage III (moderate)  Continue calcitriol and vitamin D.  Aortic stenosis  Hold Lasix for now.  Atrial  fibrillation with RVR / chronic anticoagulation  Continue Cardizem and metoprolol. If ongoing nausea/vomiting, may need to change to IV route.  Coumadin per pharmacy dosing.  Type 2 diabetes mellitus with renal manifestations/hypoglycemia/hypothermia  D/C home insulin regimen and use SSI/moderate scale and lower doses of Levemir. Hypoglycemia resolved with dextrose infusion.  DVT prophylaxis  On chronic Coumadin.  Code Status: Full.  Family Communication: Husband, son and daughter-in-law at the bedside on admission. Husband updated by telephone today. Disposition Plan: Home when stable.    IV Access:    Peripheral IV   Procedures and diagnostic studies:   Dg Abd Acute W/chest 10/17/2014: 1. No acute findings. No obstruction, generalized adynamic ileus or free air. 2. No acute cardiopulmonary disease.     Medical Consultants:    None.  Anti-Infectives:    Rocephin 10/17/14--->  Subjective:    [name] is a bit lethargic this morning, remains on the [facility].  No complaints.  Objective:    Filed Vitals:   10/18/14 0505 10/18/14 0510 10/18/14 0530 10/18/14 0600  BP: 108/58  91/45 98/48  Pulse: 71 81 91 95  Temp:      TempSrc:      Resp: 11 19 20 11   Height:      Weight:      SpO2: 99% 100% 100% 96%    Intake/Output Summary (Last 24 hours) at 10/18/14 0712 Last data filed at 10/18/14 0600  Gross per 24 hour  Intake   1800 ml  Output    650 ml  Net   1150 ml  Exam: Gen:  Lethargic Cardiovascular:  HSIR Respiratory:  Lungs CTAB Gastrointestinal:  Abdomen soft, NT/ND, + BS Extremities:  No C/E/C   Data Reviewed:    Labs: Basic Metabolic Panel:  Recent Labs Lab 10/17/14 0809 10/18/14 0002  NA 139 140  K 4.5 3.5*  CL 102 105  CO2 23 24  GLUCOSE 200* 206*  BUN 26* 26*  CREATININE 1.44* 1.47*  CALCIUM 8.3* 7.7*   GFR Estimated Creatinine Clearance: 23.7 ml/min (by C-G formula based on Cr of 1.47). Liver Function  Tests:  Recent Labs Lab 10/17/14 0809  AST 9  ALT <5  ALKPHOS 68  BILITOT 0.7  PROT 5.8*  ALBUMIN 2.4*   Coagulation profile  Recent Labs Lab 10/17/14 0809 10/18/14 0622  INR 2.32* 3.00*    CBC:  Recent Labs Lab 10/17/14 0809 10/18/14 0622  WBC 10.0 7.1  NEUTROABS 8.7*  --   HGB 11.2* 9.8*  HCT 34.9* 30.5*  MCV 98.0 97.8  PLT 186 181   CBG:  Recent Labs Lab 10/17/14 2350 10/17/14 2359 10/18/14 0021 10/18/14 0154 10/18/14 0556  GLUCAP 28* 244* 173* 115* 116*   Sepsis Labs:  Recent Labs Lab 10/17/14 0809 10/18/14 0622  WBC 10.0 7.1  LATICACIDVEN 2.2  --    Microbiology No results found for this or any previous visit (from the past 240 hour(s)).   Medications:   . antiseptic oral rinse  7 mL Mouth Rinse BID  . atorvastatin  20 mg Oral Daily  . [START ON 10/19/2014] calcitRIOL  0.25 mcg Oral Q M,W,F  . cefTRIAXone (ROCEPHIN)  IV  1 g Intravenous Q24H  . cholecalciferol  1,000 Units Oral Daily  . diltiazem  180 mg Oral Daily  . docusate sodium  100 mg Oral BID  . fluconazole (DIFLUCAN) IV  50 mg Intravenous Q24H  . insulin aspart  14 Units Subcutaneous Q breakfast   And  . insulin aspart  14 Units Subcutaneous Q lunch   And  . insulin aspart  22 Units Subcutaneous Q supper  . insulin detemir  9 Units Subcutaneous Daily   And  . insulin detemir  8 Units Subcutaneous QHS  . levothyroxine  100 mcg Oral Once per day on Mon Tue Wed Thu Fri Sat  . metoprolol  50 mg Oral BID  . pantoprazole  40 mg Oral BID  . sodium chloride  3 mL Intravenous Q12H  . vitamin B-12  1,000 mcg Oral Daily  . Warfarin - Pharmacist Dosing Inpatient   Does not apply q1800   Continuous Infusions: . sodium chloride 75 mL/hr at 10/17/14 1847  . dextrose 5 % and 0.9% NaCl 1,000 mL with potassium chloride 10 mEq infusion 75 mL/hr at 10/18/14 0125    Time spent: 35 minutes, the patient is medically complex and requires high complexity decision making.   LOS: 1 day    Jesenya Bowditch  [facility] Pager [phone]. If unable to reach me by pager, please call my cell phone at [phone].  *Please refer to [email or web address], password [email or web address] to get updated schedule on who will round on this patient, as hospitalists switch teams weekly. If 7PM-7AM, please contact night-coverage at [email or web address], password [email or web address] for any overnight needs.  10/18/2014, 7:12 AM

## 2014-10-18 NOTE — Progress Notes (Signed)
[facility] for Warfarin Indication: atrial fibrillation  Allergies  Allergen Reactions  . Tenex [Guanfacine Hcl] Other (See Comments)    hypotension  . Crestor [Rosuvastatin] Other (See Comments)    Myalgia    Patient Measurements: Height: 5\' 4"  (162.6 cm) Weight: 127 lb (57.607 kg) IBW/kg (Calculated) : 54.7  Vital Signs: Temp: 98.4 F (36.9 C) (10/25 0800) Temp Source: Axillary (10/25 0800) BP: 110/52 mmHg (10/25 0900) Pulse Rate: 85 (10/25 0900)  Labs:  Recent Labs  10/17/14 0809 10/18/14 0002 10/18/14 0622  HGB 11.2*  --  9.8*  HCT 34.9*  --  30.5*  PLT 186  --  181  LABPROT 25.7*  --  31.3*  INR 2.32*  --  3.00*  CREATININE 1.44* 1.47* 1.38*    Estimated Creatinine Clearance: 25.3 ml/min (by C-G formula based on Cr of 1.38).   Medical History: Past Medical History  Diagnosis Date  . Hypertension   . Hyperlipidemia   . Obesity   . Hypothyroidism   . Vulvar cancer, carcinoma 11/03/2011  . Atrial fibrillation   . Cancer 2007    vulva-invasive well diff squamous cell ca  . CVA (cerebral infarction)   . Dyslipidemia   . History of radiation therapy 10/20/2013-12/01/2013    60 gray to perineum area and lower pelvis  . Diabetes mellitus 05-04-14    "Diabetic coma" 1 week ago -tx. by EMT [facility] residence until pt. feeling improved and stable.  . Shortness of breath     occ. with coughing up phelgm usually mornings  . 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 Abnormal voice 05-04-14    "raspy voice" with inspiratory effort-Dr. Lissa Hoard aware 05-04-14  . Stroke 05/2000    right brain CVA pre H&P 2001-  . Anemia   . Macular degeneration   . Postoperative wound infection 07/01/2014  . Community acquired pneumonia 08/09/2012  . Corneal thinning 11/08/2012  . GI bleed 05/16/2014  . PUD (peptic ulcer disease) 05/26/2014  . Type II or unspecified type diabetes mellitus with renal manifestations, uncontrolled(250.42)  08/10/2012  . CKD (chronic kidney disease), stage III     Assessment: 72 yoF with recurrent vulvar cancer s/p multiple surgeries presents with 48 hour h/o N/V. Pt has atrial fibrillation on chronic warfarin.  Pharmacy consulted to manage warfarin inpatient.  INR therapeutic on admission.  PTA dose reported as 5 mg daily.  Last dose past week.  Patient unable to further clarify which day this past week and family was unavailable. Drug interaction: fluconazole  Goal of Therapy:  INR 2-3   Inpatient warfarin doses administered: 10/24: 1 mg  Today, 10/25:  INR rising (3.0) despite only small dose warfarin yesterday  Remains on fluconazole - strong inhibitor of warfarin metabolism, usually results in substantially lower warfarin dosage requirements.  Limited dietary intake also likely to result in lower warfarin dosage requirements.  Plan:  1. No warfarin today. 2. Daily INR while inpatient.  Clayburn Pert, PharmD, BCPS Pager: [phone] 10/18/2014  1:02 PM

## 2014-10-18 NOTE — Progress Notes (Signed)
Husband, Lenny Pastel, called and informed that patient will be transferred to 3W. Informed him of her room and how to get in contact with her. Will continue to monitor until transfer.

## 2014-10-19 DIAGNOSIS — E43 Unspecified severe protein-calorie malnutrition: Secondary | ICD-10-CM | POA: Insufficient documentation

## 2014-10-19 LAB — URINE CULTURE: Colony Count: >=100000

## 2014-10-19 LAB — GLUCOSE, CAPILLARY
GLUCOSE-CAPILLARY: 116 mg/dL — AB (ref 70–99)
GLUCOSE-CAPILLARY: 134 mg/dL — AB (ref 70–99)
Glucose-Capillary: 191 mg/dL — ABNORMAL HIGH (ref 70–99)
Glucose-Capillary: 139 mg/dL — ABNORMAL HIGH (ref 70–99)

## 2014-10-19 LAB — PROTIME-INR
INR: 3.69 — ABNORMAL HIGH (ref 0.00–1.49)
Prothrombin Time: 36.9 seconds — ABNORMAL HIGH (ref 11.6–15.2)

## 2014-10-19 LAB — BASIC METABOLIC PANEL
Anion gap: 13 (ref 5–15)
BUN: 16 mg/dL (ref 6–23)
CHLORIDE: 111 meq/L (ref 96–112)
CO2: 20 mEq/L (ref 19–32)
CREATININE: 1.22 mg/dL — AB (ref 0.50–1.10)
Calcium: 8.1 mg/dL — ABNORMAL LOW (ref 8.4–10.5)
GFR calc Af Amer: 45 mL/min — ABNORMAL LOW (ref >90)
GFR, EST NON AFRICAN AMERICAN: 39 mL/min — AB (ref >90)
Glucose, Bld: 150 mg/dL — ABNORMAL HIGH (ref 70–99)
Potassium: 4.7 mEq/L (ref 3.7–5.3)
Sodium: 144 mEq/L (ref 137–147)

## 2014-10-19 MED ORDER — ADULT MULTIVITAMIN W/MINERALS CH
1.0000 | ORAL_TABLET | Freq: Every day | ORAL | Status: DC
  Administered 2014-10-20 – 2014-10-22 (×3): 1 via ORAL
  Filled 2014-10-19 (×4): qty 1

## 2014-10-19 MED ORDER — FUROSEMIDE 10 MG/ML IJ SOLN
40.0000 mg | Freq: Once | INTRAMUSCULAR | Status: AC
  Administered 2014-10-19: 40 mg via INTRAVENOUS
  Filled 2014-10-19: qty 4

## 2014-10-19 MED ORDER — POTASSIUM CHLORIDE ER 10 MEQ PO TBCR
10.0000 meq | EXTENDED_RELEASE_TABLET | Freq: Every day | ORAL | Status: DC
  Administered 2014-10-19 – 2014-10-22 (×4): 10 meq via ORAL
  Filled 2014-10-19 (×4): qty 1

## 2014-10-19 MED ORDER — POLYETHYLENE GLYCOL 3350 17 G PO PACK
17.0000 g | PACK | Freq: Every day | ORAL | Status: DC
  Administered 2014-10-19 – 2014-10-22 (×4): 17 g via ORAL
  Filled 2014-10-19 (×4): qty 1

## 2014-10-19 MED ORDER — NYSTATIN 100000 UNIT/GM EX CREA
TOPICAL_CREAM | Freq: Three times a day (TID) | CUTANEOUS | Status: DC
  Administered 2014-10-19 – 2014-10-22 (×9): via TOPICAL
  Filled 2014-10-19 (×2): qty 15

## 2014-10-19 MED ORDER — FUROSEMIDE 20 MG PO TABS
20.0000 mg | ORAL_TABLET | Freq: Every day | ORAL | Status: DC
  Administered 2014-10-20 – 2014-10-22 (×3): 20 mg via ORAL
  Filled 2014-10-19 (×3): qty 1

## 2014-10-19 MED ORDER — GUAIFENESIN-DM 100-10 MG/5ML PO SYRP
5.0000 mL | ORAL_SOLUTION | ORAL | Status: DC | PRN
  Administered 2014-10-19: 15:00:00 via ORAL
  Administered 2014-10-20: 5 mL via ORAL
  Filled 2014-10-19 (×2): qty 10

## 2014-10-19 MED ORDER — PRO-STAT SUGAR FREE PO LIQD
30.0000 mL | Freq: Every day | ORAL | Status: DC
  Administered 2014-10-20 – 2014-10-22 (×4): 30 mL
  Filled 2014-10-19 (×4): qty 30

## 2014-10-19 MED ORDER — GLUCERNA SHAKE PO LIQD
237.0000 mL | Freq: Three times a day (TID) | ORAL | Status: DC
  Administered 2014-10-19 – 2014-10-21 (×4): 237 mL via ORAL
  Filled 2014-10-19 (×11): qty 237

## 2014-10-19 NOTE — Care Management Note (Signed)
CARE MANAGEMENT NOTE 10/19/2014  Patient:  [name], [name]   Account Number:  [id number]  Date Initiated:  10/17/2014  Documentation initiated by:  [facility]  Subjective/Objective Assessment:   dehydration  PMH of recurrent vulvar cancer     Action/Plan:   From home with spouse.  Has [facility] services.   Anticipated DC Date:     Anticipated DC Plan:  [place]  CM consult      Choice offered to / List presented to:             Status of service:  In process, will continue to follow Medicare Important Message given?   (If response is "NO", the following Medicare IM given date fields will be blank) Date Medicare IM given:   Medicare IM given by:   Date Additional Medicare IM given:   Additional Medicare IM given by:    Discharge Disposition:    Per UR Regulation:  Reviewed for med. necessity/level of care/duration of stay  If discussed at [place] of Stay Meetings, dates discussed:    Comments:  10/19/14 Marney Doctor RN,BSN,NCM Chart reviewed.  Pt is active with [facility] PT/OT/RN/AIDE.  CM will continue to follow for DC needs.

## 2014-10-19 NOTE — Progress Notes (Signed)
[facility] for Warfarin Indication: atrial fibrillation  Allergies  Allergen Reactions  . Tenex [Guanfacine Hcl] Other (See Comments)    hypotension  . Crestor [Rosuvastatin] Other (See Comments)    Myalgia    Patient Measurements: Height: 5\' 4"  (162.6 cm) Weight: 127 lb (57.607 kg) IBW/kg (Calculated) : 54.7  Vital Signs: Temp: 97.4 F (36.3 C) (10/26 0442) Temp Source: Oral (10/26 0442) BP: 105/60 mmHg (10/26 0442) Pulse Rate: 80 (10/26 0442)  Labs:  Recent Labs  10/17/14 0809 10/18/14 0002 10/18/14 0622 10/19/14 0415  HGB 11.2*  --  9.8*  --   HCT 34.9*  --  30.5*  --   PLT 186  --  181  --   LABPROT 25.7*  --  31.3* 36.9*  INR 2.32*  --  3.00* 3.69*  CREATININE 1.44* 1.47* 1.38* 1.22*    Estimated Creatinine Clearance: 28.6 ml/min (by C-G formula based on Cr of 1.22).   Assessment: 56 yoF with recurrent vulvar cancer s/p multiple surgeries presents with 48 hour h/o N/V. Pt has atrial fibrillation on chronic warfarin.  Pharmacy consulted to manage warfarin inpatient.  INR therapeutic on admission.  PTA dose reported as 5 mg daily.  Last dose past week.  Patient unable to further clarify which day this past week and family was unavailable. Drug interaction: fluconazole  Goal of Therapy:  INR 2-3   Inpatient warfarin doses administered: 10/24: 1 mg 10/25: no warfarin given  Today, 10/26:  INR rising (3.69) despite only small dose warfarin on 10/24 and no warfarin 10/25  Remains on fluconazole - strong inhibitor of warfarin metabolism, usually results in substantially lower warfarin dosage requirements.  Limited dietary intake also likely to result in lower warfarin dosage requirements.  Plan:  1. No warfarin today. 2. Daily INR while inpatient.  Thank you for the consult.  Currie Paris, PharmD, BCPS Pager: [phone] Pharmacy: [phone] 10/19/2014 12:49 PM

## 2014-10-19 NOTE — Progress Notes (Signed)
[facility]  Patient Status: Active (receiving services up to time of hospitalization)  [facility] is providing the following services: RN, PT, OT and HHA  If patient discharges after hours, please call [phone].   [name] 10/19/2014, 11:22 AM

## 2014-10-19 NOTE — Progress Notes (Signed)
Progress Note   CRISTY COLMENARES JQG:[id number] DOB: 05-05-1926 DOA: 10/17/2014 PCP: Elayne Snare, MD   Brief Narrative:   [name] is an 80 y.o. 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 The patient has an ongoing vulvar wound as well as a chronic indwelling foley and colostomy. She also has a history of atrial fibrillation on chronic coumadin. The patient was admitted on 10/17/14 after having a 2 day history of N/V with the inability to keep down any food/liquids. Her foley was exchanged in the ER and she was admitted to the floor with a presumed UTI.  Overnight on 10/18/14, the patient developed hypothermia and hypoglycemia necessitating transfer to the SDU for closer monitoring.  Assessment/Plan:   Principal Problem:  Vomiting  Continue to hydrate and provide antiemetics as needed.  Acute abdominal series unremarkable on admission.  Resolved with treatment of UTI.  Tolerating diet.  Active Problems: Hypothermia  Resolved with heating blanket. Likely secondary to hypoglycemia.   Stage III CKD  GFR in the 30s at baseline.  Baseline creatinine 1.2-1.3.  Creatinine back to usual baseline values.  Hypokalemia  Resolved with supplementation.  Candidiasis of perineum  Continue Diflucan 50 mg IV daily.  Watch for myopathy since on statin. Add Nystatin cream.  UTI  The patient's catheter has been in place for 3 months. Urine cultures collected 2, once from urine collected from old catheter and once from urine collected after new catheter placed, both polymicrobial.  Continue empiric Rocephin.  Vulvar cancer, carcinoma  Wound care RN to evaluate chronic wound in perianal area.  Hypothyroidism  Continue Synthroid.  Dyslipidemia  Continue Lipitor.  Essential hypertension    Continue Metoprolol and Cardizem as BP tolerates.  Ok to resume Lasix.  Chronic kidney disease, stage III (moderate)  Continue calcitriol and vitamin D.  Aortic stenosis  KVO IVF.  Give 40 mg Lasix x 1 and resume home dose of Lasix 10/20/14.  Atrial fibrillation with RVR / chronic anticoagulation  Continue Cardizem and metoprolol.   Coumadin per pharmacy dosing.  Type 2 diabetes mellitus with renal manifestations/hypoglycemia/hypothermia  Currently on SSI/moderate scale and lower doses of Levemir. Hypoglycemia resolved with dextrose infusion. CBGs 88-223.  DVT prophylaxis  On chronic Coumadin.  Code Status: Full.  Family Communication: Husband at the bedside. Disposition Plan: Home when stable.    IV Access:    Peripheral IV   Procedures and diagnostic studies:   Dg Abd Acute W/chest 10/17/2014: 1. No acute findings. No obstruction, generalized adynamic ileus or free air. 2. No acute cardiopulmonary disease.     Medical Consultants:    None.  Anti-Infectives:    Rocephin 10/17/14--->  Subjective:   JASMA SEEVERS is awake and alert.  No N/V.  Tolerating diet.  Complains of a new dry cough.  No frank dyspnea.  No chest pain.  Objective:    Filed Vitals:   10/18/14 1600 10/18/14 1700 10/18/14 2123 10/19/14 0442  BP:  100/49 99/80 105/60  Pulse:  50 92 80  Temp: 98.5 F (36.9 C)  97.7 F (36.5 C) 97.4 F (36.3 C)  TempSrc: Axillary  Oral Oral  Resp:  22 20 20   Height:   5\' 4"  (1.626 m)   Weight:   57.607 kg (127 lb)   SpO2:  100% 97% 100%  Intake/Output Summary (Last 24 hours) at 10/19/14 0753 Last data filed at 10/18/14 1859  Gross per 24 hour  Intake   1375 ml  Output    375 ml  Net   1000 ml    Exam: Gen:  NAD Cardiovascular:  HSIR Respiratory:  Lungs with a few bibasilar crackles Gastrointestinal:  Abdomen soft, NT/ND, + BS Extremities:  No C/E/C   Data Reviewed:    Labs: Basic Metabolic Panel:  Recent Labs Lab  10/17/14 0809 10/18/14 0002 10/18/14 0622 10/19/14 0415  NA 139 140 144 144  K 4.5 3.5* 3.7 4.7  CL 102 105 109 111  CO2 23 24 23 20   GLUCOSE 200* 206* 110* 150*  BUN 26* 26* 25* 16  CREATININE 1.44* 1.47* 1.38* 1.22*  CALCIUM 8.3* 7.7* 7.9* 8.1*   GFR Estimated Creatinine Clearance: 28.6 ml/min (by C-G formula based on Cr of 1.22). Liver Function Tests:  Recent Labs Lab 10/17/14 0809  AST 9  ALT <5  ALKPHOS 68  BILITOT 0.7  PROT 5.8*  ALBUMIN 2.4*   Coagulation profile  Recent Labs Lab 10/17/14 0809 10/18/14 0622 10/19/14 0415  INR 2.32* 3.00* 3.69*    CBC:  Recent Labs Lab 10/17/14 0809 10/18/14 0622  WBC 10.0 7.1  NEUTROABS 8.7*  --   HGB 11.2* 9.8*  HCT 34.9* 30.5*  MCV 98.0 97.8  PLT 186 181   CBG:  Recent Labs Lab 10/18/14 0556 10/18/14 0841 10/18/14 1141 10/18/14 1721 10/18/14 2222  GLUCAP 116* 88 189* 223* 140*   Sepsis Labs:  Recent Labs Lab 10/17/14 0809 10/18/14 0622  WBC 10.0 7.1  LATICACIDVEN 2.2  --    Microbiology Recent Results (from the past 240 hour(s))  URINE CULTURE     Status: None   Collection Time    10/17/14  7:50 AM      Result Value Ref Range Status   Specimen Description URINE, CATHETERIZED   Final   Special Requests NONE   Final   Culture  Setup Time     Final   Value: 10/17/2014 12:27     Performed at [facility]     Final   Value: >=100,000 COLONIES/ML     Performed at [facility]   Culture     Final   Value: Multiple bacterial morphotypes present, none predominant. Suggest appropriate recollection if clinically indicated.     Performed at [facility]   Report Status 10/19/2014 FINAL   Final  URINE CULTURE     Status: None   Collection Time    10/17/14  2:09 PM      Result Value Ref Range Status   Specimen Description URINE, CATHETERIZED   Final   Special Requests Normal   Final   Culture  Setup Time     Final   Value: 10/17/2014 18:52     Performed  at [facility]     Final   Value: 25,000 COLONIES/ML     Performed at [facility]   Culture     Final   Value: Multiple bacterial morphotypes present, none predominant. Suggest appropriate recollection if clinically indicated.     Performed at [facility]   Report Status 10/18/2014 FINAL   Final  MRSA PCR SCREENING     Status: None   Collection Time    10/18/14  6:06 AM      Result Value Ref Range Status  MRSA by PCR NEGATIVE  NEGATIVE Final   Comment:            The GeneXpert MRSA Assay (FDA     approved for NASAL specimens     only), is one component of a     comprehensive MRSA colonization     surveillance program. It is not     intended to diagnose MRSA     infection nor to guide or     monitor treatment for     MRSA infections.     Medications:   . antiseptic oral rinse  7 mL Mouth Rinse BID  . atorvastatin  20 mg Oral Daily  . calcitRIOL  0.25 mcg Oral Q M,W,F  . cefTRIAXone (ROCEPHIN)  IV  1 g Intravenous Q24H  . cholecalciferol  1,000 Units Oral Daily  . diltiazem  180 mg Oral Daily  . docusate sodium  100 mg Oral BID  . fluconazole (DIFLUCAN) IV  50 mg Intravenous Q24H  . insulin aspart  0-15 Units Subcutaneous TID WC  . insulin aspart  0-5 Units Subcutaneous QHS  . insulin detemir  5 Units Subcutaneous BID  . levothyroxine  100 mcg Oral Once per day on Mon Tue Wed Thu Fri Sat  . metoprolol  50 mg Oral BID  . pantoprazole  40 mg Oral BID  . sodium chloride  3 mL Intravenous Q12H  . vitamin B-12  1,000 mcg Oral Daily  . Warfarin - Pharmacist Dosing Inpatient   Does not apply q1800   Continuous Infusions: . sodium chloride 75 mL/hr at 10/19/14 0540  . dextrose 5 % and 0.9% NaCl 1,000 mL with potassium chloride 10 mEq infusion 75 mL/hr at 10/18/14 1516    Time spent: 35 minutes, the patient is medically complex and requires high complexity decision making.   LOS: 2 days   Avianah Pellman  [facility] Pager  [phone]. If unable to reach me by pager, please call my cell phone at [phone].  *Please refer to [email or web address], password [email or web address] to get updated schedule on who will round on this patient, as hospitalists switch teams weekly. If 7PM-7AM, please contact night-coverage at [email or web address], password [email or web address] for any overnight needs.  10/19/2014, 7:53 AM

## 2014-10-19 NOTE — Consult Note (Addendum)
WOC ostomy consult note: Patient known to me from previousl admission in May of 2015 Stoma type/location: LLQ colostomy (diverting) Stomal assessment/size: 7/8 inch round, moist budded stoma, os at center.  Cut skin barrier to 1 inch and use a skin barrier ring immediately around stoma.  Peristomal assessment: itnact, clear Treatment options for stomal/peristomal skin: skin barrier inrg Output soft brown stool Ostomy pouching: 2pc., 2 and 1/4 inch ostomy pouching system with a skin barrier ring.  Pouch is changed today and a spare pouching system is left at bedside. Education provided: Patient is not independent in her care. She has only been home from a Rehab facility for 1 week.  HHRN is supposed to follow her at home for ostomy care and wound care.   WOC wound consult note Reason for Consult:Chronic non-healing perineal wound Wound type:surgical, neoplastic Pressure Ulcer POA:No Measurement: 5cm x 6cm x 2cm Wound EHO:ZYYQ pink, moist, non-granulating (smooth) wound base, rolled wound edges (epibole) Drainage (amount, consistency, odor) thin light yellow exudate in moderate amount Periwound:intact. Dressing procedure/placement/frequency:I will continue the twice daily dressings with saline moistened gauze used to obliterate the dead space.  I have added PRN changes as needed.  [place] nursing team will not follow, but will remain available to this patient, the nursing and medical teams.  Staff can assist with ostomy care and perform wound care. Please re-consult if needed. Thanks, Lindsay Flakes, MSN, RN, [place], [place], [place] [phone])

## 2014-10-19 NOTE — Progress Notes (Signed)
INITIAL NUTRITION ASSESSMENT  DOCUMENTATION CODES Per approved criteria  -Severe malnutrition in the context of chronic illness  Pt meets criteria for severe MALNUTRITION in the context of chronic illness as evidenced by 16% wt loss x 4 months and energy intake <75% for >1 month.  INTERVENTION:  Provide Glucerna Shake po TID, each supplement provides 220 kcal and 10 grams of protein  Provide Prostat liquid protein PO 30 ml daily, each supplement provides 100 kcal, 15 grams protein. Multivitamin with minerals daily  Encourage PO intake  Assist pt with meal ordering  RD to continue to monitor  NUTRITION DIAGNOSIS: Inadequate oral intake related to nausea and vomiting as evidenced by 24 lb weight loss x 4 months.   Goal: Pt to meet >/= 90% of their estimated nutrition needs   Monitor:  PO and supplemental intake, weight, labs, I/O's  Reason for Assessment: Pt identified as at nutrition risk on the Malnutrition Screen Tool  Admitting Dx: Vomiting  ASSESSMENT: 80 y.o. 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 The patient has an ongoing vulvar wound as well as a chronic indwelling foley and colostomy. The patient was admitted on 10/17/14 after having a 2 day history of N/V with the inability to keep down any food/liquids.   PO intake: 10% Pt and family were in room during visit. Family reports pt has lost 30 lb since April. Per weight documentation history, pt has lost 24 lb since June (16% wt loss x 4 month, this is significant for time frame).  Pt has not been able to eat d/t decreased appetite for the past month. Family states that for the past month she may eat an egg or cereal for breakfast and that would be all for the day. Since admission appetite has improved slightly, pt reports  eating grits, cranberry juice and coffee this AM for breakfast. Family states that they have been providing pt with Glucerna every morning.  Pt with large sacral wound and sepsis. Emphasized the importance of eating enough protein to aid in wound healing. Pt would benefit from protein supplement, will order Prostat 30 ml daily. Also, RD to order Glucerna TID.  RD assisted pt with ordering lunch.   Nutrition focused physical exam shows no sign of depletion of muscle mass or body fat.  Labs reviewed: Elevated Creatinine Glucose 150  Height: Ht Readings from Last 1 Encounters:  10/18/14 5\' 4"  (1.626 m)    Weight: Wt Readings from Last 1 Encounters:  10/18/14 127 lb (57.607 kg)    Ideal Body Weight: 120 lb  % Ideal Body Weight: 94%  Wt Readings from Last 10 Encounters:  10/18/14 127 lb (57.607 kg)  08/06/14 131 lb 12.8 oz (59.784 kg)  07/10/14 130 lb (58.968 kg)  07/01/14 138 lb 9.6 oz (62.869 kg)  06/22/14 138 lb (62.596 kg)  06/10/14 137 lb 6.4 oz (62.324 kg)  06/05/14 138 lb (62.596 kg)  05/25/14 151 lb 0.2 oz (68.5 kg)  05/25/14 151 lb 0.2 oz (68.5 kg)  05/25/14 151 lb 0.2 oz (68.5 kg)    Usual Body Weight: 150 lb  % Usual Body Weight: 85%  BMI:  Body mass index is 21.79 kg/(m^2).  Estimated Nutritional Needs: Kcal: 4270-6237 Protein: 85-95g Fluid: 1.8L/day  Skin: Sacral wound (stage unknown), MSAD   Diet Order: Carb Control  EDUCATION NEEDS: -  Education not appropriate at this time   Intake/Output Summary (Last 24 hours) at 10/19/14 0925 Last data filed at 10/18/14 1859  Gross per 24 hour  Intake   1275 ml  Output    375 ml  Net    900 ml    Last BM: 10/25  Labs:   Recent Labs Lab 10/18/14 0002 10/18/14 0622 10/19/14 0415  NA 140 144 144  K 3.5* 3.7 4.7  CL 105 109 111  CO2 24 23 20   BUN 26* 25* 16  CREATININE 1.47* 1.38* 1.22*  CALCIUM 7.7* 7.9* 8.1*  GLUCOSE 206* 110* 150*    CBG (last 3)   Recent Labs  10/18/14 1141  10/18/14 1721 10/18/14 2222  GLUCAP 189* 223* 140*    Scheduled Meds: . antiseptic oral rinse  7 mL Mouth Rinse BID  . atorvastatin  20 mg Oral Daily  . calcitRIOL  0.25 mcg Oral Q M,W,F  . cefTRIAXone (ROCEPHIN)  IV  1 g Intravenous Q24H  . cholecalciferol  1,000 Units Oral Daily  . diltiazem  180 mg Oral Daily  . docusate sodium  100 mg Oral BID  . fluconazole (DIFLUCAN) IV  50 mg Intravenous Q24H  . insulin aspart  0-15 Units Subcutaneous TID WC  . insulin aspart  0-5 Units Subcutaneous QHS  . insulin detemir  5 Units Subcutaneous BID  . levothyroxine  100 mcg Oral Once per day on Mon Tue Wed Thu Fri Sat  . metoprolol  50 mg Oral BID  . pantoprazole  40 mg Oral BID  . sodium chloride  3 mL Intravenous Q12H  . vitamin B-12  1,000 mcg Oral Daily  . Warfarin - Pharmacist Dosing Inpatient   Does not apply q1800    Continuous Infusions: . sodium chloride 75 mL/hr at 10/19/14 0540  . dextrose 5 % and 0.9% NaCl 1,000 mL with potassium chloride 10 mEq infusion 75 mL/hr at 10/18/14 1516    Past Medical History  Diagnosis Date  . Hypertension   . Hyperlipidemia   . Obesity   . Hypothyroidism   . Vulvar cancer, carcinoma 11/03/2011  . Atrial fibrillation   . Cancer 2007    vulva-invasive well diff squamous cell ca  . CVA (cerebral infarction)   . Dyslipidemia   . History of radiation therapy 10/20/2013-12/01/2013    60 gray to perineum area and lower pelvis  . Diabetes mellitus 05-04-14    "Diabetic coma" 1 week ago -tx. by EMT [facility] residence until pt. feeling improved and stable.  . Shortness of breath     occ. with coughing up phelgm usually mornings  . 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 Abnormal voice 05-04-14    "raspy voice" with inspiratory effort-Dr. Lissa Hoard aware 05-04-14  . Stroke 05/2000    right brain CVA pre H&P 2001-  . Anemia   . Macular degeneration   . Postoperative wound infection 07/01/2014  . Community acquired pneumonia 08/09/2012  .  Corneal thinning 11/08/2012  . GI bleed 05/16/2014  . PUD (peptic ulcer disease) 05/26/2014  . Type II or unspecified type diabetes mellitus with renal manifestations, uncontrolled(250.42) 08/10/2012  . CKD (chronic kidney disease), stage III     Past Surgical History  Procedure Laterality Date  . Vulvar lesion removal  2012  . Breast surgery      Lumpectomy  . Dilation and curettage of uterus    . Cystourethroplasty / ureteroneocystostomy    . Radical  vulvectomy    . Lymphadenectomy  2007    R inguinal femoral  . Radical vulvectomy  02/27/12  . Vulvar lesion removal  02/27/2012    Procedure: VULVAR LESION;  Surgeon: Alvino Chapel, MD;  Location: [facility];  Service: Gynecology;  Laterality: N/A;  . Right carotid endarterctomy    . Left eye surgery      , permanently stitched eye together  . Vulvectomy N/A 05/12/2014    Procedure: RADICAL VULVECTOMY WITH COLOSTOMY;  Surgeon: Alvino Chapel, MD;  Location: [facility];  Service: Gynecology;  Laterality: N/A;  . Laparotomy N/A 05/12/2014    Procedure: EXPLORATORY LAPAROTOMY, TOTAL ABDO MINAL HYSTERECTOMY AND BILATERAL SALPINGOOPHERECTOMY;  Surgeon: Alvino Chapel, MD;  Location: [facility];  Service: Gynecology;  Laterality: N/A;  . Esophagogastroduodenoscopy N/A 05/20/2014    Procedure: ESOPHAGOGASTRODUODENOSCOPY (EGD);  Surgeon: Jeryl Columbia, MD;  Location: [facility];  Service: Endoscopy;  Laterality: N/A;    Clayton Bibles, MS, RD, LDN Pager: [phone] After Hours Pager: [phone]

## 2014-10-19 NOTE — Clinical Documentation Improvement (Signed)
Possible Clinical Conditions? POA? Septicemia / Sepsis Severe Sepsis Neutropenic Sepsis  Sepsis with Catheter related UTI Septic Shock Candida Sepsis  Other Condition  Cannot clinically Determine   Supporting Information:(As per notes)"The patient has an ongoing vulvar wound as well as a chronic indwelling foley and colostomy." "Candidiasis of perineum . Diflucan 50 mg IV daily." "UTI . The patient's catheter has been in place for 3 months. Repeat urine cultures were sent after the catheter was replaced. . Continue empiric Rocephin. "  Vitals in the ED: BP 115/58  Pulse 112  Temp(Src) 97.6 F (36.4 C) (Oral)  Resp 14  SpO2 88%  Thank You, Alessandra Grout, RN, BSN, CCDS,Clinical Documentation Specialist:  [phone]  [phone]=[facility] [place]- Health Information Management

## 2014-10-20 DIAGNOSIS — E785 Hyperlipidemia, unspecified: Secondary | ICD-10-CM

## 2014-10-20 LAB — BASIC METABOLIC PANEL
ANION GAP: 12 (ref 5–15)
BUN: 13 mg/dL (ref 6–23)
CALCIUM: 8.8 mg/dL (ref 8.4–10.5)
CHLORIDE: 108 meq/L (ref 96–112)
CO2: 23 meq/L (ref 19–32)
Creatinine, Ser: 1.12 mg/dL — ABNORMAL HIGH (ref 0.50–1.10)
GFR calc Af Amer: 50 mL/min — ABNORMAL LOW (ref >90)
GFR calc non Af Amer: 43 mL/min — ABNORMAL LOW (ref >90)
GLUCOSE: 100 mg/dL — AB (ref 70–99)
Potassium: 3.9 mEq/L (ref 3.7–5.3)
Sodium: 143 mEq/L (ref 137–147)

## 2014-10-20 LAB — GLUCOSE, CAPILLARY
GLUCOSE-CAPILLARY: 187 mg/dL — AB (ref 70–99)
Glucose-Capillary: 117 mg/dL — ABNORMAL HIGH (ref 70–99)
Glucose-Capillary: 199 mg/dL — ABNORMAL HIGH (ref 70–99)
Glucose-Capillary: 147 mg/dL — ABNORMAL HIGH (ref 70–99)

## 2014-10-20 LAB — PROTIME-INR
INR: 3 — AB (ref 0.00–1.49)
Prothrombin Time: 31.3 seconds — ABNORMAL HIGH (ref 11.6–15.2)

## 2014-10-20 MED ORDER — WARFARIN 0.5 MG HALF TABLET
0.5000 mg | ORAL_TABLET | Freq: Once | ORAL | Status: AC
  Administered 2014-10-20: 0.5 mg via ORAL
  Filled 2014-10-20: qty 1

## 2014-10-20 NOTE — Evaluation (Signed)
Physical Therapy Evaluation Patient Details Name: [name] MRN: [id number] DOB: 02/19/1926 Today's Date: 10/20/2014   History of Present Illness  80 y.o. female admitted for hypothermia and hypoglycemia with a PMH of recurrent vulvar cancer status post multiple surgeries as well as radiation therapy, with her most recent procedure being a posterior pelvic exenteration 05/12/14, CVA, DM, L eye vision loss. The patient has an ongoing vulvar wound as well as a chronic indwelling foley and colostomy  Clinical Impression  Pt currently with functional limitations due to the deficits listed below (see PT Problem List).  Pt will benefit from skilled PT to increase their independence and safety with mobility to allow discharge to the venue listed below.  Pt reports recently being at SNF and was home for a week prior to this admission.  Pt reports using RW for mobility and denies hx of falls.  Would recommend initial supervision for mobility upon d/c.  Pt reports being active with [facility] services.  Pt seated in recliner while nursing exchanged beds and aware for pt not to sit long as well as packing came out during gait.     Follow Up Recommendations Home health PT;Supervision for mobility/OOB    Equipment Recommendations  None recommended by PT    Recommendations for Other Services       Precautions / Restrictions Precautions Precaution Comments: perineal packing      Mobility  Bed Mobility Overal bed mobility: Needs Assistance Bed Mobility: Supine to Sit     Supine to sit: Mod assist     General bed mobility comments: assist for trunk  Transfers Overall transfer level: Needs assistance Equipment used: 1 person hand held assist;Rolling walker (2 wheeled) Transfers: Sit to/from Stand Sit to Stand: +2 safety/equipment         General transfer comment: pt unable to tolerate sitting EOB due to perineal hx so assisted pt to standing quickly then provided pt with RW, assist to control  descent  Ambulation/Gait Ambulation/Gait assistance: Min assist;Min guard Ambulation Distance (Feet): 60 Feet Assistive device: Rolling walker (2 wheeled) Gait Pattern/deviations: Step-through pattern;Decreased stride length;Trunk flexed Gait velocity: decr   General Gait Details: pt ambulated in hallway however reported some dizziness as well as lost perineal packing so brought recliner behind pt (with pillow in seat)  Stairs            Wheelchair Mobility    Modified Rankin (Stroke Patients Only)       Balance                                             Pertinent Vitals/Pain Pain Assessment: No/denies pain    Home Living Family/patient expects to be discharged to:: Private residence Living Arrangements: Spouse/significant other Available Help at Discharge: Family Type of Home: House Home Access: Stairs to enter Entrance Stairs-Rails: None Technical brewer of Steps: 2 Home Layout: One level Home Equipment: Environmental consultant - 2 wheels Additional Comments: just finished rehab at [facility] and has been home for about a week prior to admission    Prior Function Level of Independence: Needs assistance         Comments: reports using RW     Hand Dominance        Extremity/Trunk Assessment               Lower Extremity Assessment: Generalized weakness  Communication   Communication: No difficulties  Cognition Arousal/Alertness: Awake/alert Behavior During Therapy: WFL for tasks assessed/performed Overall Cognitive Status: Within Functional Limits for tasks assessed                      General Comments      Exercises        Assessment/Plan    PT Assessment Patient needs continued PT services  PT Diagnosis Difficulty walking   PT Problem List Decreased strength;Decreased mobility;Decreased knowledge of use of DME;Decreased activity tolerance  PT Treatment Interventions DME instruction;Gait  training;Functional mobility training;Therapeutic activities;Therapeutic exercise;Patient/family education   PT Goals (Current goals can be found in the Care Plan section) Acute Rehab PT Goals PT Goal Formulation: With patient Time For Goal Achievement: 10/27/14 Potential to Achieve Goals: Good    Frequency Min 3X/week   Barriers to discharge        Co-evaluation               End of Session   Activity Tolerance: Patient tolerated treatment well Patient left: in chair;with call bell/phone within reach;with nursing/sitter in room Nurse Communication: Mobility status         Time: 5170-0174 PT Time Calculation (min): 14 min   Charges:   PT Evaluation $Initial PT Evaluation Tier I: 1 Procedure PT Treatments $Gait Training: 8-22 mins   PT G Codes:          Mitul Hallowell,KATHrine E 10/20/2014, 12:42 PM Carmelia Bake, PT, DPT 10/20/2014 Pager: [phone]

## 2014-10-20 NOTE — Progress Notes (Addendum)
Patient ID: [name], female   DOB: 1926/03/20, 80 y.o.   MRN: [id number] [facility] PROGRESS NOTE  [name] FKC:[id number] DOB: Oct 13, 1926 DOA: 10/17/2014 PCP: Elayne Snare, MD  Brief narrative: 80 y.o. 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 The patient has an ongoing vulvar wound as well as a chronic indwelling foley and colostomy. She also has a history of atrial fibrillation on chronic coumadin. The patient was admitted on 10/17/14 after having a 2 day history of N/V with the inability to keep down any food/liquids. Her foley was exchanged in the ER and she was admitted to the floor with a presumed UTI. Overnight on 10/18/14, the patient developed hypothermia and hypoglycemia necessitating transfer to the SDU for closer monitoring.   Assessment/Plan:   Principal Problem:  Vomiting / dehydration   Admission abdominal x ray showed no acute findings.   Reduce IV fluids rate due to patient's history of aortic stenosis.  Vomiting resolved with treatment of UTI.   Active Problems:  Hypothermia   Likely secondary to hypoglycemia. Resolved with heating blanket.  Stage III CKD   GFR in the 30s at baseline. Baseline creatinine 1.2-1.3.   Creatinine improved with IV fluids, 1.38 --> 1.12  Continue calcitriol and vitamin D. Hypokalemia   Secondary to GI losses. Supplemented and remains within normal limits. Candidiasis of perineum / Vulvar cancer, carcinoma  Continue Diflucan 50 mg IV daily. Watch for myopathy since on statin.   Continue Nystatin cream.  Appreciate wound care assessment. Chronic non-healing perineal wound 5cm x 6cm x 2cm  With thin light yellow exudate in moderate amount. Dressing recommendations: continue twice daily dressings with saline moistened gauze used  to obliterate the dead space.  Urinary tract infection  The patient's catheter has been in place for 3 months. Urine cultures collected 2, once from urine collected from old catheter and once from urine collected after new catheter placed, both polymicrobial.   On empiric Rocephin. Hypothyroidism   Continue Synthroid. Dyslipidemia   Continue Lipitor. Essential hypertension   Continue Metoprolol and Cardizem as BP tolerates. Ok to resume Lasix. Aortic stenosis   Given 40 mg Lasix x 1 and resumedhome dose of Lasix 10/20/14. Reduced IV fluids. Atrial fibrillation with RVR / chronic anticoagulation   Continue Cardizem and metoprolol, per pharmacy dosing.  Continue coumadin per pharmacy. INR 3 this am. Type 2 diabetes mellitus with renal manifestations/hypoglycemia/hypothermia   Continue Levemir 5 units BID and SSI.  CBG's in past 24 hours: 134, 117, 199 DVT prophylaxis  On anticoagulation with coumadin.   Code Status: Full.  Family Communication: family not at the bedside this am   Disposition Plan: remains inpatient    IV Access:   Peripheral IV Procedures and diagnostic studies:   Dg Abd Acute W/chest 10/17/2014: 1. No acute findings. No obstruction, generalized adynamic ileus or free air. 2. No acute cardiopulmonary disease.  Medical Consultants:   None. Anti-Infectives:   Rocephin 10/17/14--->   Leisa Lenz, MD  [facility] Pager [phone]  If 7PM-7AM, please contact night-coverage [email or web address] Password [email or web address] 10/20/2014, 3:04 PM   LOS: 3 days    HPI/Subjective: No acute overnight events.  Objective: Filed Vitals:   10/20/14 0459 10/20/14 0943 10/20/14 1058 10/20/14 1230  BP: 139/89 141/121 129/94 99/75  Pulse: 88 91 98   Temp: 97.4 F (  36.3 C)     TempSrc: Oral     Resp: 18     Height:      Weight:      SpO2: 99%       Intake/Output Summary (Last 24 hours) at 10/20/14 1504 Last data filed at 10/20/14 1436  Gross per 24 hour   Intake    370 ml  Output   3300 ml  Net  -2930 ml    Exam:   General:  Pt is alert, not in acute distress  Cardiovascular: rate controled, irregular , S1/S2 appreciated   Respiratory: Clear to auscultation bilaterally, no wheezing, no crackles, no rhonchi  Abdomen: non distended, bowel sounds present; has LLQ colostomy   Extremities: Pulses DP and PT palpable bilaterally  Neuro: Grossly nonfocal  Data Reviewed: Basic Metabolic Panel:  Recent Labs Lab 10/17/14 0809 10/18/14 0002 10/18/14 0622 10/19/14 0415 10/20/14 0525  NA 139 140 144 144 143  K 4.5 3.5* 3.7 4.7 3.9  CL 102 105 109 111 108  CO2 23 24 23 20 23   GLUCOSE 200* 206* 110* 150* 100*  BUN 26* 26* 25* 16 13  CREATININE 1.44* 1.47* 1.38* 1.22* 1.12*  CALCIUM 8.3* 7.7* 7.9* 8.1* 8.8   Liver Function Tests:  Recent Labs Lab 10/17/14 0809  AST 9  ALT <5  ALKPHOS 68  BILITOT 0.7  PROT 5.8*  ALBUMIN 2.4*   No results found for this basename: LIPASE, AMYLASE,  in the last 168 hours No results found for this basename: AMMONIA,  in the last 168 hours CBC:  Recent Labs Lab 10/17/14 0809 10/18/14 0622  WBC 10.0 7.1  NEUTROABS 8.7*  --   HGB 11.2* 9.8*  HCT 34.9* 30.5*  MCV 98.0 97.8  PLT 186 181   Cardiac Enzymes: No results found for this basename: CKTOTAL, CKMB, CKMBINDEX, TROPONINI,  in the last 168 hours BNP: No components found with this basename: POCBNP,  CBG:  Recent Labs Lab 10/19/14 1209 10/19/14 1744 10/19/14 2145 10/20/14 0729 10/20/14 1156  GLUCAP 191* 139* 134* 117* 199*    URINE CULTURE     Status: None   Collection Time    10/17/14  7:50 AM      Result Value Ref Range Status   Specimen Description URINE, CATHETERIZED   Final   Value: Multiple bacterial morphotypes present, none predominant. Suggest appropriate recollection if clinically indicated.     Performed at [facility]   Report Status 10/19/2014 FINAL   Final  URINE CULTURE     Status: None    Collection Time    10/17/14  2:09 PM      Result Value Ref Range Status   Specimen Description URINE, CATHETERIZED   Final   Value: Multiple bacterial morphotypes present, none predominant. Suggest appropriate recollection if clinically indicated.     Performed at [facility]   Report Status 10/18/2014 FINAL   Final  CULTURE, BLOOD (ROUTINE X 2)     Status: None   Collection Time    10/18/14  1:38 AM      Result Value Ref Range Status   Specimen Description BLOOD R ARM   Final   Value:        BLOOD CULTURE RECEIVED NO GROWTH TO DATE CULTURE WILL BE HELD FOR 5 DAYS BEFORE ISSUING A FINAL NEGATIVE REPORT     Performed at [facility]   Report Status PENDING   Incomplete  MRSA PCR SCREENING  Status: None   Collection Time    10/18/14  6:06 AM      Result Value Ref Range Status   MRSA by PCR NEGATIVE  NEGATIVE Final  CULTURE, BLOOD (ROUTINE X 2)     Status: None   Collection Time    10/18/14  6:22 AM      Result Value Ref Range Status   Specimen Description BLOOD L ARM   Final   Value:        BLOOD CULTURE RECEIVED NO GROWTH TO DATE CULTURE WILL BE HELD FOR 5 DAYS BEFORE ISSUING A FINAL NEGATIVE REPORT     Performed at [facility]   Report Status PENDING   Incomplete     Scheduled Meds: . atorvastatin  20 mg Oral Daily  . calcitRIOL  0.25 mcg Oral Q M,W,F  . cefTRIAXone (ROCEPHIN)  IV  1 g Intravenous Q24H  . cholecalciferol  1,000 Units Oral Daily  . diltiazem  180 mg Oral Daily  . docusate sodium  100 mg Oral BID  . feeding supplement (GLUCERNA SHAKE)  237 mL Oral TID BM  . feeding supplement (PRO-STAT SUGAR FREE 64)  30 mL Per Tube Q1500  . fluconazole (DIFLUCAN)   50 mg Intravenous Q24H  . furosemide  20 mg Oral Daily  . insulin aspart  0-15 Units Subcutaneous TID WC  . insulin aspart  0-5 Units Subcutaneous QHS  . insulin detemir  5 Units Subcutaneous BID  . levothyroxine  100 mcg Oral Once per day on Mon Tue Wed Thu Fri Sat  . metoprolol  50  mg Oral BID  . multivitamin w  1 tablet Oral Daily  . nystatin cream   Topical TID  . pantoprazole  40 mg Oral BID  . polyethylene glycol  17 g Oral Daily  . potassium chloride  10 mEq Oral Daily  . vitamin B-12  1,000 mcg Oral Daily  . warfarin  0.5 mg Oral ONCE-1800   Continuous Infusions: . sodium chloride 1,000 mL (10/20/14 0840)  . dextrose 5 % and 0.9% NaCl 1,000 mL with potassium chloride 10 mEq infusion 75 mL/hr at 10/18/14 1516

## 2014-10-20 NOTE — Progress Notes (Signed)
[place] for Warfarin Indication: atrial fibrillation  Allergies  Allergen Reactions  . Tenex [Guanfacine Hcl] Other (See Comments)    hypotension  . Crestor [Rosuvastatin] Other (See Comments)    Myalgia    Patient Measurements: Height: 5\' 4"  (162.6 cm) Weight: 127 lb (57.607 kg) IBW/kg (Calculated) : 54.7  Vital Signs: Temp: 97.4 F (36.3 C) (10/27 0459) Temp Source: Oral (10/27 0459) BP: 139/89 mmHg (10/27 0459) Pulse Rate: 88 (10/27 0459)  Labs:  Recent Labs  10/18/14 0622 10/19/14 0415 10/20/14 0525  HGB 9.8*  --   --   HCT 30.5*  --   --   PLT 181  --   --   LABPROT 31.3* 36.9* 31.3*  INR 3.00* 3.69* 3.00*  CREATININE 1.38* 1.22* 1.12*    Estimated Creatinine Clearance: 31.1 ml/min (by C-G formula based on Cr of 1.12).   Assessment: 51 yoF with recurrent vulvar cancer s/p multiple surgeries presents with 48 hour h/o N/V. Pt has atrial fibrillation on chronic warfarin.  Pharmacy consulted to manage warfarin inpatient.  INR therapeutic on admission.  PTA dose reported as 5 mg daily.  Last dose past week.  Patient unable to further clarify which day this past week and family was unavailable.  Drug interaction: fluconazole  Goal of Therapy:  INR 2-3   Inpatient warfarin doses administered: 10/24: 1 mg 10/25: no warfarin given 10/26: no warfarin given  Today, 10/27:  INR trending down (3.0 today which is decreased from 3.69 yesterday) and at the upper end of her goal range after receiving small dose 10/24 and holding 2 doses.  Remains on fluconazole - strong inhibitor of warfarin metabolism, usually results in substantially lower warfarin dosage requirements.  Limited dietary intake also likely to result in lower warfarin dosage requirements.  With the potential for fluconazole to increase the INR, and the fact that she seems very sensitive to warfarin right now (as a dose of 1 mg provided a fairly steep increase  in her INR), will remain conservative with dosing. However, we also likely have not seen the full effect of holding 2 doses, and holding another dose completely may cause her INR to fall below her goal range.   No bleeding issues noted.  Plan:  1. Warfarin 0.5 mg PO x 1 dose tonight 2. Daily INR while inpatient.  Sheliah Mends, PharmD Clinical Pharmacist PGY2 Heme/Onc Pharmacy Resident Pager: [phone] 10/20/2014 9:38 AM

## 2014-10-20 NOTE — Progress Notes (Signed)
ANTIBIOTIC CONSULT NOTE - Follow Up  Pharmacy Consult for Fluconazole Indication: vulvar and skin candidiasis  Allergies  Allergen Reactions  . Tenex [Guanfacine Hcl] Other (See Comments)    hypotension  . Crestor [Rosuvastatin] Other (See Comments)    Myalgia    Patient Measurements: Height: 5\' 4"  (162.6 cm) Weight: 127 lb (57.607 kg) IBW/kg (Calculated) : 54.7  Vital Signs: Temp: 97.4 F (36.3 C) (10/27 0459) Temp Source: Oral (10/27 0459) BP: 139/89 mmHg (10/27 0459) Pulse Rate: 88 (10/27 0459) Intake/Output from previous day: 10/26 0701 - 10/27 0700 In: 240 [P.O.:240] Out: 3700 [Urine:3700] Intake/Output from this shift:   Labs:  Recent Labs  10/18/14 0622 10/19/14 0415 10/20/14 0525  WBC 7.1  --   --   HGB 9.8*  --   --   PLT 181  --   --   CREATININE 1.38* 1.22* 1.12*   Estimated Creatinine Clearance: 31.1 ml/min (by C-G formula based on Cr of 1.12).  Assessment: 80 yoF with recurrent vulvar cancer s/p multiple surgeries presented with 48 hour history of nausea and vomiting. She has an ongoing vulvar wound and indwelling foley and colostomy.  Pharmacy has been consulted to dose fluconazole for vulvar and skin candidiasis.  Noted history of atrial fibrillation on chronic warfarin (drug interaction with fluconazole).  10/24 fluconazole >> 10/24 ceftriaxone >>  Tmax: afebrile WBC: WNL on 10/25 Renal: SCr improved to 1.22, CrCl~31 ml/min (CG)  10/24 Urine: >100k mult bacterial morphotypes (taken from dirty Foley) 10/24 urine: 25k mult bacterial morphotypes (taken from clean Foley) 10/25 MRSA PCR: negative 10/25 Blood x2: ngtd  Goal of Therapy:  Appropriate renally adjusted dosing Eradication of infection  Plan:  -Continue fluconazole 50 mg IV q24h (dose adjusted for CrCl<50 ml/min) -Will consider switching to PO once patient's symptoms of N/V improve.  Sheliah Mends, PharmD Clinical Pharmacist PGY2 Heme/Onc Resident Pager: [phone] 10/20/2014  9:47 AM

## 2014-10-20 NOTE — Plan of Care (Signed)
Problem: Phase I Progression Outcomes Goal: Voiding-avoid urinary catheter unless indicated Outcome: Not Met (add Reason) Patient has foley catheter,hx of resection of rectum.

## 2014-10-21 LAB — GLUCOSE, CAPILLARY
GLUCOSE-CAPILLARY: 155 mg/dL — AB (ref 70–99)
Glucose-Capillary: 188 mg/dL — ABNORMAL HIGH (ref 70–99)
Glucose-Capillary: 152 mg/dL — ABNORMAL HIGH (ref 70–99)
Glucose-Capillary: 175 mg/dL — ABNORMAL HIGH (ref 70–99)

## 2014-10-21 LAB — PROTIME-INR
INR: 2.15 — ABNORMAL HIGH (ref 0.00–1.49)
PROTHROMBIN TIME: 24.2 s — AB (ref 11.6–15.2)

## 2014-10-21 MED ORDER — WARFARIN SODIUM 2.5 MG PO TABS
2.5000 mg | ORAL_TABLET | Freq: Once | ORAL | Status: AC
  Administered 2014-10-21: 2.5 mg via ORAL
  Filled 2014-10-21: qty 1

## 2014-10-21 MED ORDER — FLUCONAZOLE 50 MG PO TABS
50.0000 mg | ORAL_TABLET | ORAL | Status: DC
  Administered 2014-10-21 – 2014-10-22 (×2): 50 mg via ORAL
  Filled 2014-10-21 (×2): qty 1

## 2014-10-21 NOTE — Progress Notes (Signed)
Gynecologic Oncology  Subjective: Patient reports severe itching to the sacral area.  "There is tape back there.  I can't stand it."  Tolerating diet with no nausea or emesis.  Denies chest pain or dyspnea.  No other concerns voiced.    Objective: Vital signs in last 24 hours: Temp:  [97.4 F (36.3 C)-97.6 F (36.4 C)] 97.5 F (36.4 C) (10/28 0551) Pulse Rate:  [66-107] 107 (10/28 0551) Resp:  [16-18] 18 (10/28 0551) BP: (99-141)/(53-121) 122/67 mmHg (10/28 0551) SpO2:  [94 %-98 %] 98 % (10/28 0551) Last BM Date: 10/19/14  Intake/Output from previous day: 10/27 0701 - 10/28 0700 In: 4467.8 [P.O.:480; I.V.:3937.8; IV Piggyback:50] Out: 2100 [Urine:2100]  Physical Examination: General: alert, cooperative and mild distress related to severe itching in the sacral area Resp: clear to auscultation bilaterally Cardio: irregularly irregular rhythm GI: soft, non-tender; bowel sounds normal; no masses,  no organomegaly Extremities: extremities normal, atraumatic, no cyanosis or edema Colostomy intact with no output in the bag at this time Vulvar incision open, packing in place Moderate erythema to the sacral area, dressing removed, superficial skin abrasion noted to the sacrum measuring 2 cm long, patient attempting to scratch the area during examination.  Nystatin cream at the bedside applied around the sacral area, abd pad placed over the area and held in place with mesh panties.    Assessment: 80 y.o.with recurrent vulvar cancer status post posterior exenteration on 05/12/14 by Dr. Fermin Schwab.  Recently admitted due to nausea/vomiting x 2 days, poor po intake, probable UTI.    Pain:  Pain is well controlled.  PRN medications ordered if needed.  ID: On Rocephin IV for UTI.  Foley in place.  CV: Chronic atrial fibrillation.  On coumadin.  GI:  Tolerating po: Yes     Endo: Diabetes mellitus Type II, under good control.  CBG:  CBG (last 3)   Recent Labs  10/20/14 2130  10/21/14 0734 10/21/14 1218  GLUCAP 147* 155* 188*    Prophylaxis: On chronic coumadin  Plan: Continue plan of care per [facility] Instructed to avoid scratching and to turn frequently Continue wound care to the vulva   LOS: 4 days    CROSS, MELISSA DEAL 10/21/2014, 9:35 AM

## 2014-10-21 NOTE — Progress Notes (Signed)
[place] for Warfarin Indication: atrial fibrillation  Allergies  Allergen Reactions  . Tenex [Guanfacine Hcl] Other (See Comments)    hypotension  . Crestor [Rosuvastatin] Other (See Comments)    Myalgia    Patient Measurements: Height: 5\' 4"  (162.6 cm) Weight: 127 lb (57.607 kg) IBW/kg (Calculated) : 54.7  Vital Signs: Temp: 97.5 F (36.4 C) (10/28 0551) Temp Source: Oral (10/28 0551) BP: 116/70 mmHg (10/28 0947) Pulse Rate: 107 (10/28 0551)  Labs:  Recent Labs  10/19/14 0415 10/20/14 0525 10/21/14 0445  LABPROT 36.9* 31.3* 24.2*  INR 3.69* 3.00* 2.15*  CREATININE 1.22* 1.12*  --     Estimated Creatinine Clearance: 31.1 ml/min (by C-G formula based on Cr of 1.12).   Assessment: 47 yoF with recurrent vulvar cancer s/p multiple surgeries presents with 48 hour h/o N/V. Pt has atrial fibrillation on chronic warfarin.  Pharmacy consulted to manage warfarin inpatient.  INR therapeutic on admission.  PTA dose reported as 5 mg daily.  Last dose past week.  Patient unable to further clarify which day this past week and family was unavailable.  Drug interaction: fluconazole  Goal of Therapy:  INR 2-3   INR trend: 2.32 > 3.0 > 3.69 > 3.0 > 2.15  Inpatient warfarin doses administered: 10/24: 1 mg 10/25: no warfarin given 10/26: no warfarin given 10/27: 0.5 mg  Today, 10/27:  INR today 2.15, back down within goal range  Remains on fluconazole - strong inhibitor of warfarin metabolism, usually results in substantially lower warfarin dosage requirements.  Limited dietary intake also likely to result in lower warfarin dosage requirements.  With the potential for fluconazole to increase the INR, and the fact that she seems very sensitive to warfarin right now (as a dose of 1 mg provided a fairly steep increase in her INR), will remain conservative with dosing.  No bleeding issues noted.  Plan:  1. Warfarin 2.5 mg PO x 1 dose  tonight 2. Daily INR while inpatient. 3. CBC in AM

## 2014-10-21 NOTE — Progress Notes (Signed)
Patient ID: [name], female   DOB: [birth year], 80 y.o.   MRN: [id number] [facility] PROGRESS NOTE  TAMIRA RYLAND RSW:[id number] DOB: 1926/12/17 DOA: 10/17/2014 PCP: Elayne Snare, MD  Brief narrative: 80 y.o. 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 The patient has an ongoing vulvar wound as well as a chronic indwelling foley and colostomy. She also has a history of atrial fibrillation on chronic coumadin. The patient was admitted on 10/17/14 after having a 2 day history of N/V with the inability to keep down any food/liquids. Her foley was exchanged in the ER and she was admitted to the floor with a presumed UTI. Overnight on 10/18/14, the patient developed hypothermia and hypoglycemia necessitating transfer to the SDU for closer monitoring.   Assessment/Plan:   Principal Problem:  Vomiting / dehydration  Admission abdominal x ray showed no acute findings.  Stable hemodynamically. No further vomiting. BP stable at 130/87. May continue current IV fluids, D5% with potassium chloride at 30 cc/hr.  Active Problems:  Hypothermia  Likely secondary to hypoglycemia. Resolved with heating blanket.  Normal temp now. Stage III CKD  GFR in the 30s at baseline. Baseline creatinine 1.2-1.3.  Creatinine improved with IV fluids, 1.38 --> 1.12  We will continue calcitriol and vitamin D. Hypokalemia  Secondary to GI losses. Supplemented and remains within normal limits. Candidiasis of perineum / Vulvar cancer, carcinoma Continue Diflucan 50 mg daily, changed to PO regimen. Continue to watch for myopathy since on statin.  Continue Nystatin cream.  Per wound care assessment Chronic non-healing perineal wound 5cm x 6cm x 2cm with thin light yellow exudate in moderate amount. Dressing recommendations:  continue twice daily dressings with saline moistened gauze used to obliterate the dead space.  Appreciate GYN oncology seeing the patient and their recommendations.  Urinary tract infection  The patient's catheter has been in place for 3 months. Urine cultures collected 2, once from urine collected from old catheter and once from urine collected after new catheter placed, both polymicrobial.  Contineu empiric Rocephin until 10/22/2014 which would complete 5 day treatment for UTI. Hypothyroidism  Continue Synthroid 100 mcg daily. Dyslipidemia  Continue Lipitor. Essential hypertension  Continue Metoprolol 50 mg PO BID, Lasix 20 mg dailyand Cardizem 180 PO daily Aortic stenosis  Given 40 mg Lasix x 1 and resumed home dose of Lasix 10/20/14.  Atrial fibrillation with RVR / chronic anticoagulation  Continue Cardizem and metoprolol, per pharmacy dosing.  Continue coumadin per pharmacy.  Type 2 diabetes mellitus with renal manifestations/hypoglycemia/hypothermia  Continue Levemir 5 units BID and SSI.  DVT prophylaxis  On anticoagulation with coumadin.  Code Status: Full.  Family Communication: family not at the bedside this am  Disposition Plan: remains inpatient   IV Access:   Peripheral IV Procedures and diagnostic studies:   Dg Abd Acute W/chest 10/17/2014: 1. No acute findings. No obstruction, generalized adynamic ileus or free air. 2. No acute cardiopulmonary disease.  Medical Consultants:   None. Anti-Infectives:   Rocephin 10/17/14--->   Leisa Lenz, MD  [facility] Pager [phone]  If 7PM-7AM, please contact night-coverage [email or web address] Password [email or web address] 10/21/2014, 9:22 AM   LOS: 4 days    HPI/Subjective: No acute overnight events.  Objective: Filed Vitals:   10/20/14 1230 10/20/14 1411 10/20/14 2030 10/21/14 0551  BP: 99/75 108/53 121/63 122/67  Pulse:  96 66 107  Temp:  97.4 F (36.3 C) 97.6 F (36.4 C) 97.5 F (36.4 C)  TempSrc:  Oral Oral Oral   Resp:  16 16 18   Height:      Weight:      SpO2:  94% 96% 98%    Intake/Output Summary (Last 24 hours) at 10/21/14 1610 Last data filed at 10/21/14 0600  Gross per 24 hour  Intake 4227.75 ml  Output   1500 ml  Net 2727.75 ml    Exam:   General:  Pt is alert, not in acute distress  Cardiovascular: irregular rhythm, S1/S2 appreciated   Respiratory: Clear to auscultation bilaterally, no wheezing, no crackles, no rhonchi  Abdomen: Soft, non tender, non distended, bowel sounds present  Extremities: No edema, pulses DP and PT palpable bilaterally  Neuro: Grossly nonfocal  Data Reviewed: Basic Metabolic Panel:  Recent Labs Lab 10/17/14 0809 10/18/14 0002 10/18/14 0622 10/19/14 0415 10/20/14 0525  NA 139 140 144 144 143  K 4.5 3.5* 3.7 4.7 3.9  CL 102 105 109 111 108  CO2 23 24 23 20 23   GLUCOSE 200* 206* 110* 150* 100*  BUN 26* 26* 25* 16 13  CREATININE 1.44* 1.47* 1.38* 1.22* 1.12*  CALCIUM 8.3* 7.7* 7.9* 8.1* 8.8   Liver Function Tests:  Recent Labs Lab 10/17/14 0809  AST 9  ALT <5  ALKPHOS 68  BILITOT 0.7  PROT 5.8*  ALBUMIN 2.4*   No results found for this basename: LIPASE, AMYLASE,  in the last 168 hours No results found for this basename: AMMONIA,  in the last 168 hours CBC:  Recent Labs Lab 10/17/14 0809 10/18/14 0622  WBC 10.0 7.1  NEUTROABS 8.7*  --   HGB 11.2* 9.8*  HCT 34.9* 30.5*  MCV 98.0 97.8  PLT 186 181   Cardiac Enzymes: No results found for this basename: CKTOTAL, CKMB, CKMBINDEX, TROPONINI,  in the last 168 hours BNP: No components found with this basename: POCBNP,  CBG:  Recent Labs Lab 10/20/14 0729 10/20/14 1156 10/20/14 1704 10/20/14 2130 10/21/14 0734  GLUCAP 117* 199* 187* 147* 155*    URINE CULTURE     Status: None   Collection Time    10/17/14  7:50 AM      Result Value Ref Range Status   Specimen Description URINE, CATHETERIZED   Final   Value: Multiple bacterial morphotypes present, none  predominant. Suggest appropriate recollection if clinically indicated.     Performed at [facility]   Report Status 10/19/2014 FINAL   Final  URINE CULTURE     Status: None   Collection Time    10/17/14  2:09 PM      Result Value Ref Range Status   Specimen Description URINE, CATHETERIZED   Final   Value: Multiple bacterial morphotypes present, none predominant. Suggest appropriate recollection if clinically indicated.     Performed at [facility]   Report Status 10/18/2014 FINAL   Final  CULTURE, BLOOD (ROUTINE X 2)     Status: None   Collection Time    10/18/14  1:38 AM      Result Value Ref Range Status   Specimen Description BLOOD R ARM   Final   Value:        BLOOD CULTURE RECEIVED NO GROWTH TO DATE CULTURE WILL BE HELD FOR 5 DAYS BEFORE ISSUING A FINAL NEGATIVE REPORT     Performed at [facility]   Report Status PENDING   Incomplete  MRSA PCR SCREENING     Status: None   Collection Time    10/18/14  6:06 AM      Result Value Ref Range Status   MRSA by PCR NEGATIVE  NEGATIVE Final  CULTURE, BLOOD (ROUTINE X 2)     Status: None   Collection Time    10/18/14  6:22 AM      Result Value Ref Range Status   Specimen Description BLOOD L ARM   Final   Value:        BLOOD CULTURE RECEIVED NO GROWTH TO DATE CULTURE WILL BE HELD FOR 5 DAYS BEFORE ISSUING A FINAL NEGATIVE REPORT     Performed at [facility]   Report Status PENDING   Incomplete     Scheduled Meds: . antiseptic oral rinse  7 mL Mouth Rinse BID  . atorvastatin  20 mg Oral Daily  . calcitRIOL  0.25 mcg Oral Q M,W,F  . cefTRIAXone (ROCEPHIN)  IV  1 g Intravenous Q24H  . cholecalciferol  1,000 Units Oral Daily  . diltiazem  180 mg Oral Daily  . docusate sodium  100 mg Oral BID  . feeding supplement (GLUCERNA SHAKE)  237 mL Oral TID BM  . feeding supplement (PRO-STAT SUGAR FREE 64)  30 mL Per Tube Q1500  . fluconazole (DIFLUCAN)   50 mg Intravenous Q24H  . furosemide  20 mg Oral  Daily  . insulin aspart  0-15 Units Subcutaneous TID WC  . insulin aspart  0-5 Units Subcutaneous QHS  . insulin detemir  5 Units Subcutaneous BID  . levothyroxine  100 mcg Oral Once per day on Mon Tue Wed Thu Fri Sat  . metoprolol  50 mg Oral BID  . multivitamin   1 tablet Oral Daily  . nystatin cream   Topical TID  . pantoprazole  40 mg Oral BID  . polyethylene glycol  17 g Oral Daily  . potassium chloride  10 mEq Oral Daily  . sodium chloride  3 mL Intravenous Q12H  . vitamin B-12  1,000 mcg Oral Daily  . Warfarin - Pharmacist Dosing Inpatient   Does not apply q1800   Continuous Infusions: . dextrose 5 % and 0.9% NaCl 1,000 mL with potassium chloride 10 mEq infusion 30 mL/hr at 10/20/14 1722

## 2014-10-21 NOTE — Plan of Care (Signed)
Problem: Phase I Progression Outcomes Goal: OOB as tolerated unless otherwise ordered Outcome: Progressing Patient needs one -2 assists with transfers and with toileting.

## 2014-10-22 DIAGNOSIS — I350 Nonrheumatic aortic (valve) stenosis: Secondary | ICD-10-CM

## 2014-10-22 LAB — GLUCOSE, CAPILLARY
GLUCOSE-CAPILLARY: 163 mg/dL — AB (ref 70–99)
GLUCOSE-CAPILLARY: 234 mg/dL — AB (ref 70–99)

## 2014-10-22 LAB — CBC
HCT: 32 % — ABNORMAL LOW (ref 36.0–46.0)
Hemoglobin: 10.6 g/dL — ABNORMAL LOW (ref 12.0–15.0)
MCH: 31.9 pg (ref 26.0–34.0)
MCHC: 33.1 g/dL (ref 30.0–36.0)
MCV: 96.4 fL (ref 78.0–100.0)
Platelets: 193 10*3/uL (ref 150–400)
RBC: 3.32 MIL/uL — ABNORMAL LOW (ref 3.87–5.11)
RDW: 15.8 % — AB (ref 11.5–15.5)
WBC: 9.2 10*3/uL (ref 4.0–10.5)

## 2014-10-22 LAB — BASIC METABOLIC PANEL
ANION GAP: 13 (ref 5–15)
BUN: 16 mg/dL (ref 6–23)
CALCIUM: 8.7 mg/dL (ref 8.4–10.5)
CHLORIDE: 97 meq/L (ref 96–112)
CO2: 27 mEq/L (ref 19–32)
CREATININE: 1.05 mg/dL (ref 0.50–1.10)
GFR calc Af Amer: 54 mL/min — ABNORMAL LOW (ref >90)
GFR, EST NON AFRICAN AMERICAN: 47 mL/min — AB (ref >90)
Glucose, Bld: 159 mg/dL — ABNORMAL HIGH (ref 70–99)
Potassium: 3.8 mEq/L (ref 3.7–5.3)
Sodium: 137 mEq/L (ref 137–147)

## 2014-10-22 LAB — PROTIME-INR
INR: 1.6 — ABNORMAL HIGH (ref 0.00–1.49)
Prothrombin Time: 19.2 seconds — ABNORMAL HIGH (ref 11.6–15.2)

## 2014-10-22 MED ORDER — NYSTATIN 100000 UNIT/GM EX CREA: TOPICAL_CREAM | Freq: Three times a day (TID) | CUTANEOUS | Status: DC

## 2014-10-22 MED ORDER — WARFARIN SODIUM 4 MG PO TABS: 4.0000 mg | ORAL_TABLET | Freq: Once | ORAL | Status: DC

## 2014-10-22 MED ORDER — INSULIN ASPART 100 UNIT/ML ~~LOC~~ SOLN: 0.0000 [IU] | Freq: Three times a day (TID) | SUBCUTANEOUS | Status: DC

## 2014-10-22 MED ORDER — ACETAMINOPHEN 325 MG PO TABS: 650.0000 mg | ORAL_TABLET | Freq: Four times a day (QID) | ORAL | Status: AC | PRN

## 2014-10-22 MED ORDER — ALUM & MAG HYDROXIDE-SIMETH 200-200-20 MG/5ML PO SUSP: 30.0000 mL | Freq: Four times a day (QID) | ORAL | Status: DC | PRN

## 2014-10-22 MED ORDER — PRO-STAT SUGAR FREE PO LIQD: 30.0000 mL | Freq: Every day | ORAL | Status: DC

## 2014-10-22 MED ORDER — FLUCONAZOLE 50 MG PO TABS: 50.0000 mg | ORAL_TABLET | ORAL | Status: DC

## 2014-10-22 MED ORDER — INSULIN DETEMIR 100 UNIT/ML ~~LOC~~ SOLN: 5.0000 [IU] | Freq: Two times a day (BID) | SUBCUTANEOUS | Status: DC

## 2014-10-22 MED ORDER — GLUCERNA SHAKE PO LIQD: 237.0000 mL | Freq: Three times a day (TID) | ORAL | Status: AC

## 2014-10-22 NOTE — Progress Notes (Signed)
Patient discharged to home via wheelchair with family, discharge instructions reviewed with patient and family who verbablized understanding.New RX's given to patient.

## 2014-10-22 NOTE — Discharge Instructions (Signed)

## 2014-10-22 NOTE — Progress Notes (Signed)
Physical Therapy Treatment Patient Details Name: [name] MRN: [id number] DOB: Mar 09, 1926 Today's Date: 2014/10/24    History of Present Illness 80 y.o. female admitted for hypothermia and hypoglycemia with a PMH of recurrent vulvar cancer status post multiple surgeries as well as radiation therapy, with her most recent procedure being a posterior pelvic exenteration 05/12/14, CVA, DM, L eye vision loss. The patient has an ongoing vulvar wound as well as a chronic indwelling foley and colostomy    PT Comments    Pt ambulated in hallway and fatigues quickly.  Continue to recommend HHPT and supervision for mobility.  Follow Up Recommendations  Home health PT;Supervision for mobility/OOB     Equipment Recommendations  None recommended by PT    Recommendations for Other Services       Precautions / Restrictions Precautions Precaution Comments: perineal packing    Mobility  Bed Mobility Overal bed mobility: Needs Assistance Bed Mobility: Supine to Sit;Sit to Supine     Supine to sit: Min assist Sit to supine: Min guard   General bed mobility comments: assist for scooting to EOB due to pain  Transfers Overall transfer level: Needs assistance Equipment used: Rolling walker (2 wheeled) Transfers: Sit to/from Stand Sit to Stand: Min guard         General transfer comment: verbal cues for safety  Ambulation/Gait Ambulation/Gait assistance: Min guard Ambulation Distance (Feet): 160 Feet Assistive device: Rolling walker (2 wheeled) Gait Pattern/deviations: Step-through pattern;Trunk flexed Gait velocity: decr   General Gait Details: verbal cues for RW positioning, fatigues quickly and required a few short standing rest breaks   Stairs            Wheelchair Mobility    Modified Rankin (Stroke Patients Only)       Balance                                    Cognition Arousal/Alertness: Awake/alert Behavior During Therapy: WFL for tasks  assessed/performed Overall Cognitive Status: Within Functional Limits for tasks assessed                      Exercises General Exercises - Lower Extremity Heel Slides: AROM;Supine;Both;10 reps Hip ABduction/ADduction: AROM;Both;10 reps;Supine Straight Leg Raises: AROM;Supine;Both;10 reps    General Comments        Pertinent Vitals/Pain Pain Assessment: No/denies pain    Home Living                      Prior Function            PT Goals (current goals can now be found in the care plan section) Progress towards PT goals: Progressing toward goals    Frequency  Min 3X/week    PT Plan Current plan remains appropriate    Co-evaluation             End of Session   Activity Tolerance: Patient limited by fatigue Patient left: with call bell/phone within reach;in bed;with family/visitor present     Time: 1035-1100 PT Time Calculation (min): 25 min  Charges:  $Gait Training: 23-37 mins                    G Codes:      Evangelynn Lochridge,KATHrine E Oct 24, 2014, 12:48 PM Carmelia Bake, PT, DPT 2014-10-24 Pager: [phone]

## 2014-10-22 NOTE — Progress Notes (Signed)
[place] for Warfarin Indication: atrial fibrillation  Allergies  Allergen Reactions  . Tenex [Guanfacine Hcl] Other (See Comments)    hypotension  . Crestor [Rosuvastatin] Other (See Comments)    Myalgia    Patient Measurements: Height: 5\' 4"  (162.6 cm) Weight: 127 lb (57.607 kg) IBW/kg (Calculated) : 54.7  Vital Signs: Temp: 98.5 F (36.9 C) (10/29 0515) Temp Source: Oral (10/29 0515) BP: 111/73 mmHg (10/29 0515) Pulse Rate: 88 (10/29 0515)  Labs:  Recent Labs  10/20/14 0525 10/21/14 0445 10/22/14 0540  HGB  --   --  10.6*  HCT  --   --  32.0*  PLT  --   --  193  LABPROT 31.3* 24.2* 19.2*  INR 3.00* 2.15* 1.60*  CREATININE 1.12*  --  1.05    Estimated Creatinine Clearance: 33.2 ml/min (by C-G formula based on Cr of 1.05).   Assessment: 34 yoF with recurrent vulvar cancer s/p multiple surgeries presents with 48 hour h/o N/V. Pt has atrial fibrillation on chronic warfarin.  Pharmacy consulted to manage warfarin inpatient.  INR therapeutic on admission.  PTA dose reported as 5 mg daily.  Last dose past week.  Patient unable to further clarify which day this past week and family was unavailable.  Drug interaction: fluconazole  Goal of Therapy:  INR 2-3   INR trend: 2.32 > 3.0 > 3.69 > 3.0 > 2.15 > 1.6  Inpatient warfarin doses administered: 10/24: 1 mg 10/25: no warfarin given 10/26: no warfarin given 10/27: 0.5 mg 10/28: 2.5 mg  Today, 10/28:  INR today 1.6, now below goal range, after holding doses and conservative management due to DDI with fluconazole and decreased PO intake  Remains on fluconazole, D#5 - strong inhibitor of warfarin metabolism, usually results in substantially lower warfarin dosage requirements.  Patient now with improved PO intake.  CBC is stable  No bleeding issues noted.  Plan:  1. Warfarin 4 mg PO x 1 dose tonight 2. Daily INR while inpatient.  Thank you for the consult.  Currie Paris, PharmD, BCPS Pager: [phone] Pharmacy: [phone] 10/22/2014 8:12 AM

## 2014-10-22 NOTE — Care Management Note (Signed)
CARE MANAGEMENT NOTE 10/22/2014  Patient:  [name], [name]   Account Number:  [id number]  Date Initiated:  10/17/2014  Documentation initiated by:  [facility]  Subjective/Objective Assessment:   dehydration  PMH of recurrent vulvar cancer     Action/Plan:   From home with spouse.  Has [facility] services.   Anticipated DC Date:     Anticipated DC Plan:  [place]  CM consult      Choice offered to / List presented to:             Status of service:  In process, will continue to follow Medicare Important Message given?  YES (If response is "NO", the following Medicare IM given date fields will be blank) Date Medicare IM given:  10/22/2014 Medicare IM given by:  Marney Doctor Date Additional Medicare IM given:   Additional Medicare IM given by:    Discharge Disposition:    Per UR Regulation:  Reviewed for med. necessity/level of care/duration of stay  If discussed at Index of Stay Meetings, dates discussed:    Comments:  10/22/14 Marney Doctor RN,BSN,NCM Pt to DC back home today with [facility] services.  No other CM needs noted for DC.  10/19/14 Marney Doctor RN,BSN,NCM Chart reviewed.  Pt is active with [facility] for [facility] PT/OT/RN/AIDE.  CM will continue to follow for DC needs.

## 2014-10-22 NOTE — Discharge Summary (Signed)
Physician Discharge Summary  [name] STM:[id number] DOB: [birth year] DOA: 10/17/2014  PCP: Elayne Snare, MD  Admit date: 10/17/2014 Discharge date: 10/22/2014  Recommendations for Outpatient Follow-up:  1. Sliding scale insulin, please give if CBG are not in normal range.  CBG 121 - 150: 2 units      CBG 151 - 200: 3 units      CBG 201 - 250: 5 units      CBG 251 - 300: 8 units      CBG 301 - 350: 11 units      CBG 351 - 400: 15 units   2. Take coumadin 4 mg tonight and then recheck INR in am and adjust coumadin dose so that INR is in 2-3 range. 3. Continue fluconazole 50 mg BID for 14 days.  Discharge Diagnoses:  Principal Problem:   Vomiting Active Problems:   Vulvar cancer, carcinoma   Hypothyroidism   Dyslipidemia   Essential hypertension   Chronic kidney disease, stage III (moderate)   Chronic anticoagulation   Aortic stenosis   Atrial fibrillation with RVR   Type 2 diabetes mellitus with renal manifestations   UTI (urinary tract infection)   Candidiasis of perineum   Hypothermia   Hypoglycemia   Hypokalemia   CKD (chronic kidney disease), stage III   Protein-calorie malnutrition, severe    Discharge Condition: stable   Diet recommendation: as tolerated   History of present illness:  80 y.o. 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 The patient has an ongoing vulvar wound as well as a chronic indwelling foley and colostomy. She also has a history of atrial fibrillation on chronic coumadin. The patient was admitted on 10/17/14 after having a 2 day history of N/V with the inability to keep down any food/liquids. Her foley was exchanged in the ER and she was admitted to the floor with a presumed UTI. Overnight on 10/18/14, the patient developed hypothermia and  hypoglycemia necessitating transfer to the SDU for closer monitoring.   Assessment/Plan:   Principal Problem:  Vomiting / dehydration   Admission abdominal x ray showed no acute findings.   Stable hemodynamically. No further vomiting. BP stable.  Active Problems:  Hypothermia   Likely secondary to hypoglycemia. Resolved with heating blanket.   Normal temp now. Stage III CKD   GFR in the 30s at baseline. Baseline creatinine 1.2-1.3.   Creatinine improved with IV fluids, 1.38 --> 1.12   Continue calcitriol and vitamin D. Hypokalemia   Secondary to GI losses. Supplemented and remains within normal limits. Candidiasis of perineum / Vulvar cancer, carcinoma  Continue Diflucan 50 mg daily, changed to PO regimen. Continue to watch for myopathy since on statin.   Continue Nystatin cream.   Per wound care assessment Chronic non-healing perineal wound 5cm x 6cm x 2cm with thin light yellow exudate in moderate amount. Dressing recommendations: continue twice daily dressings with saline moistened gauze used to obliterate the dead space.   Appreciate GYN oncology seeing the patient and their recommendations. Urinary tract infection   The patient's catheter has been in place for 3 months. Urine cultures collected 2, once from urine collected from old catheter and once from urine collected after new catheter placed, both polymicrobial.   Contineu empiric Rocephin until 10/22/2014 which would complete 5 day treatment for UTI. Hypothyroidism   Continue  Synthroid 100 mcg daily. Dyslipidemia   Continue Lipitor. Essential hypertension   Continue Metoprolol 50 mg PO BID, Lasix 20 mg dailyand Cardizem 180 PO daily Aortic stenosis   Given 40 mg Lasix x 1 and resumed home dose of Lasix 10/20/14. Atrial fibrillation with RVR / chronic anticoagulation   Continue Cardizem and metoprolol, per pharmacy dosing.   Continue coumadin as noted above 4 mg tonight and then recheck  INR In am and adjust coumadin dose further.  Type 2 diabetes mellitus with renal manifestations/hypoglycemia/hypothermia   Continue Levemir 5 units BID and SSI. DVT prophylaxis   On anticoagulation with coumadin.  Code Status: Full.  Family Communication: family at the bedside this am    IV Access:    Peripheral IV Procedures and diagnostic studies:   Dg Abd Acute W/chest 10/17/2014: 1. No acute findings. No obstruction, generalized adynamic ileus or free air. 2. No acute cardiopulmonary disease.  Medical Consultants:    None. Anti-Infectives:    Rocephin 10/17/14---> 10/22/2014    Signed:  Leisa Lenz, MD  [facility] 10/22/2014, 11:54 AM  Pager #: [phone]   Discharge Exam: Filed Vitals:   10/22/14 0515  BP: 111/73  Pulse: 88  Temp: 98.5 F (36.9 C)  Resp: 16   Filed Vitals:   10/21/14 0947 10/21/14 1321 10/21/14 2120 10/22/14 0515  BP: 116/70 130/87 109/79 111/73  Pulse:   53 88  Temp:  97.4 F (36.3 C) 97.6 F (36.4 C) 98.5 F (36.9 C)  TempSrc:  Oral Oral Oral  Resp:  18 16 16   Height:      Weight:      SpO2:  93% 91% 90%    General: Pt is alert, not in acute distress Cardiovascular: Regular rate and rhythm, S1/S2 +, no murmurs Respiratory: no wheezing, no crackles, no rhonchi Abdominal: Soft, non tender, non distended, bowel sounds +, no guarding Extremities: no cyanosis, pulses palpable bilaterally DP and PT; perineal wound Neuro: Grossly nonfocal  Discharge Instructions  Discharge Instructions   Call MD for:  difficulty breathing, headache or visual disturbances    Complete by:  As directed      Call MD for:  persistant dizziness or light-headedness    Complete by:  As directed      Call MD for:  persistant nausea and vomiting    Complete by:  As directed      Call MD for:  severe uncontrolled pain    Complete by:  As directed      Diet - low sodium heart healthy    Complete by:  As directed      Discharge  instructions    Complete by:  As directed   1. Sliding scale insulin, please give if CBG are not in normal range.  CBG 121 - 150: 2 units      CBG 151 - 200: 3 units      CBG 201 - 250: 5 units      CBG 251 - 300: 8 units      CBG 301 - 350: 11 units      CBG 351 - 400: 15 units   2. Take coumadin 4 mg tonight and then recheck INR in am and adjust coumadin dose so that INR is in 2-3 range. 3. Continue fluconazole 50 mg BID for 14 days.     Increase activity slowly    Complete by:  As directed             Medication  List    STOP taking these medications       insulin lispro 100 UNIT/ML injection  Commonly known as:  HUMALOG      TAKE these medications       acetaminophen 325 MG tablet  Commonly known as:  TYLENOL  Take 2 tablets (650 mg total) by mouth every 6 (six) hours as needed for mild pain (or Fever >/= 101).     alum & mag hydroxide-simeth 200-200-20 MG/5ML suspension  Commonly known as:  MAALOX/MYLANTA  Take 30 mLs by mouth every 6 (six) hours as needed for indigestion or heartburn (dyspepsia).     atorvastatin 20 MG tablet  Commonly known as:  LIPITOR  Take 20 mg by mouth daily.     calcitRIOL 0.25 MCG capsule  Commonly known as:  ROCALTROL  Take 0.25 mcg by mouth every Monday, Wednesday, and Friday.     cholecalciferol 1000 UNITS tablet  Commonly known as:  VITAMIN D  Take 1,000 Units by mouth daily.     diltiazem 180 MG 24 hr capsule  Commonly known as:  CARDIZEM CD  Take 180 mg by mouth daily.     docusate sodium 100 MG capsule  Commonly known as:  COLACE  Take 100 mg by mouth 2 (two) times daily.     feeding supplement (GLUCERNA SHAKE) Liqd  Take 237 mLs by mouth 3 (three) times daily between meals.     feeding supplement (PRO-STAT SUGAR FREE 64) Liqd  Place 30 mLs into feeding tube daily at 3 pm.     fluconazole 50 MG tablet  Commonly known as:  DIFLUCAN  Take 1 tablet (50 mg total) by mouth daily.     furosemide 20 MG tablet  Commonly  known as:  LASIX  Take 20 mg by mouth daily.     insulin aspart 100 UNIT/ML injection  Commonly known as:  novoLOG  Inject 0-15 Units into the skin 3 (three) times daily with meals.     insulin detemir 100 UNIT/ML injection  Commonly known as:  LEVEMIR  Inject 0.05 mLs (5 Units total) into the skin 2 (two) times daily.     levothyroxine 100 MCG tablet  Commonly known as:  SYNTHROID, LEVOTHROID  Take 100 mcg by mouth as directed. Every day except sunday     metoprolol 50 MG tablet  Commonly known as:  LOPRESSOR  Take 50 mg by mouth 2 (two) times daily.     nystatin cream  Commonly known as:  MYCOSTATIN  Apply topically 3 (three) times daily.     pantoprazole 40 MG tablet  Commonly known as:  PROTONIX  Take 40 mg by mouth 2 (two) times daily.     polyethylene glycol packet  Commonly known as:  MIRALAX / GLYCOLAX  Take 17 g by mouth daily.     potassium chloride 10 MEQ tablet  Commonly known as:  K-DUR  Take 10 mEq by mouth daily.     traMADol 50 MG tablet  Commonly known as:  ULTRAM  Take 50 mg by mouth every 6 (six) hours as needed for moderate pain.     vitamin B-12 1000 MCG tablet  Commonly known as:  CYANOCOBALAMIN  Take 1,000 mcg by mouth daily.     warfarin 4 MG tablet  Commonly known as:  COUMADIN  Take 1 tablet (4 mg total) by mouth one time only at 6 PM.           Follow-up Information  Follow up with [facility], MD. Schedule an appointment as soon as possible for a visit in 2 weeks. (Follow up appt after recent hospitalization)    Specialty:  Endocrinology   Contact information:   [address] [place] [address] [phone]        The results of significant diagnostics from this hospitalization (including imaging, microbiology, ancillary and laboratory) are listed below for reference.    Significant Diagnostic Studies: Dg Abd Acute W/chest  10/17/2014   CLINICAL DATA:  Two days of vomiting. Diarrhea last night. No chest  complaints per patient. History vulvular carcinoma, diabetes and atrial fibrillation. Ex-smoker.  EXAM: ACUTE ABDOMEN SERIES (ABDOMEN 2 VIEW & CHEST 1 VIEW)  COMPARISON:  05/16/2014.  FINDINGS: No bowel dilation is seen to suggest obstruction or generalized adynamic ileus. There is no free air. There is a round opacity that projects in the left mid abdomen which is of unclear etiology. This could reflect a colostomy device.  Her vascular calcifications in the pelvis and in the proximal thighs. Soft tissues are otherwise unremarkable.  Cardiac silhouette is top-normal in size. There are no acute findings in the lungs.  The bony structures are diffusely demineralized.  IMPRESSION: 1. No acute findings. No obstruction, generalized adynamic ileus or free air. 2. No acute cardiopulmonary disease.   Electronically Signed   By: Lajean Manes M.D.   On: 10/17/2014 09:10    Microbiology: Recent Results (from the past 240 hour(s))  URINE CULTURE     Status: None   Collection Time    10/17/14  7:50 AM      Result Value Ref Range Status   Specimen Description URINE, CATHETERIZED   Final   Special Requests NONE   Final   Culture  Setup Time     Final   Value: 10/17/2014 12:27     Performed at [facility] Count     Final   Value: >=100,000 COLONIES/ML     Performed at [facility]   Culture     Final   Value: Multiple bacterial morphotypes present, none predominant. Suggest appropriate recollection if clinically indicated.     Performed at [facility]   Report Status 10/19/2014 FINAL   Final  URINE CULTURE     Status: None   Collection Time    10/17/14  2:09 PM      Result Value Ref Range Status   Specimen Description URINE, CATHETERIZED   Final   Special Requests Normal   Final   Culture  Setup Time     Final   Value: 10/17/2014 18:52     Performed at [place]     Final   Value: 25,000 COLONIES/ML     Performed at [facility]    Culture     Final   Value: Multiple bacterial morphotypes present, none predominant. Suggest appropriate recollection if clinically indicated.     Performed at [facility]   Report Status 10/18/2014 FINAL   Final  CULTURE, BLOOD (ROUTINE X 2)     Status: None   Collection Time    10/18/14  1:38 AM      Result Value Ref Range Status   Specimen Description BLOOD R ARM   Final   Special Requests BOTTLES DRAWN AEROBIC ONLY 5CC   Final   Culture  Setup Time     Final   Value: 10/18/2014 09:50  Performed at [facility]     Final   Value:        BLOOD CULTURE RECEIVED NO GROWTH TO DATE CULTURE WILL BE HELD FOR 5 DAYS BEFORE ISSUING A FINAL NEGATIVE REPORT     Performed at [facility]   Report Status PENDING   Incomplete  MRSA PCR SCREENING     Status: None   Collection Time    10/18/14  6:06 AM      Result Value Ref Range Status   MRSA by PCR NEGATIVE  NEGATIVE Final   Comment:            The GeneXpert MRSA Assay (FDA     approved for NASAL specimens     only), is one component of a     comprehensive MRSA colonization     surveillance program. It is not     intended to diagnose MRSA     infection nor to guide or     monitor treatment for     MRSA infections.  CULTURE, BLOOD (ROUTINE X 2)     Status: None   Collection Time    10/18/14  6:22 AM      Result Value Ref Range Status   Specimen Description BLOOD L ARM   Final   Special Requests BOTTLES DRAWN AEROBIC ONLY 10CC   Final   Culture  Setup Time     Final   Value: 10/18/2014 09:50     Performed at [facility]   Culture     Final   Value:        BLOOD CULTURE RECEIVED NO GROWTH TO DATE CULTURE WILL BE HELD FOR 5 DAYS BEFORE ISSUING A FINAL NEGATIVE REPORT     Performed at [facility]   Report Status PENDING   Incomplete     Labs: Basic Metabolic Panel:  Recent Labs Lab 10/18/14 0002 10/18/14 0622 10/19/14 0415 10/20/14 0525 10/22/14 0540  NA 140 144 144  143 137  K 3.5* 3.7 4.7 3.9 3.8  CL 105 109 111 108 97  CO2 24 23 20 23 27   GLUCOSE 206* 110* 150* 100* 159*  BUN 26* 25* 16 13 16   CREATININE 1.47* 1.38* 1.22* 1.12* 1.05  CALCIUM 7.7* 7.9* 8.1* 8.8 8.7   Liver Function Tests:  Recent Labs Lab 10/17/14 0809  AST 9  ALT <5  ALKPHOS 68  BILITOT 0.7  PROT 5.8*  ALBUMIN 2.4*   No results found for this basename: LIPASE, AMYLASE,  in the last 168 hours No results found for this basename: AMMONIA,  in the last 168 hours CBC:  Recent Labs Lab 10/17/14 0809 10/18/14 0622 10/22/14 0540  WBC 10.0 7.1 9.2  NEUTROABS 8.7*  --   --   HGB 11.2* 9.8* 10.6*  HCT 34.9* 30.5* 32.0*  MCV 98.0 97.8 96.4  PLT 186 181 193   Cardiac Enzymes: No results found for this basename: CKTOTAL, CKMB, CKMBINDEX, TROPONINI,  in the last 168 hours BNP: BNP (last 3 results) No results found for this basename: PROBNP,  in the last 8760 hours CBG:  Recent Labs Lab 10/21/14 0734 10/21/14 1218 10/21/14 1623 10/21/14 2049 10/22/14 0756  GLUCAP 155* 188* 152* 175* 163*    Time coordinating discharge: Over 30 minutes

## 2014-10-23 ENCOUNTER — Ambulatory Visit: Payer: Medicare Other | Admitting: Gynecology

## 2014-10-24 LAB — CULTURE, BLOOD (ROUTINE X 2)
CULTURE: NO GROWTH
Culture: NO GROWTH

## 2014-10-26 ENCOUNTER — Other Ambulatory Visit: Payer: Self-pay | Admitting: Internal Medicine

## 2014-10-27 ENCOUNTER — Encounter: Payer: Self-pay | Admitting: Endocrinology

## 2014-10-27 ENCOUNTER — Ambulatory Visit (INDEPENDENT_AMBULATORY_CARE_PROVIDER_SITE_OTHER): Payer: Medicare Other | Admitting: Endocrinology

## 2014-10-27 VITALS — HR 114 | Resp 16 | Ht 63.0 in

## 2014-10-27 DIAGNOSIS — E1165 Type 2 diabetes mellitus with hyperglycemia: Secondary | ICD-10-CM

## 2014-10-27 DIAGNOSIS — E1121 Type 2 diabetes mellitus with diabetic nephropathy: Secondary | ICD-10-CM

## 2014-10-27 DIAGNOSIS — IMO0002 Reserved for concepts with insufficient information to code with codable children: Secondary | ICD-10-CM

## 2014-10-27 DIAGNOSIS — I639 Cerebral infarction, unspecified: Secondary | ICD-10-CM

## 2014-10-27 DIAGNOSIS — E039 Hypothyroidism, unspecified: Secondary | ICD-10-CM

## 2014-10-27 NOTE — Progress Notes (Signed)
Patient ID: [name], female   DOB: 04/11/1926, 80 y.o.   MRN: [id number]   Reason for Appointment: Low blood sugar  History of Present Illness   Diagnosis: Type 2 DIABETES MELITUS, long-standing  Previous history: She has been on insulin for several years and usually is compliant with her basal bolus regimen. However she usually has significant fluctuation in her blood sugars and difficult to have consistent trend. Overall blood sugars are usually high after meals  Insulin regimen: Levemir 5 units twice a day and Humalog 0-15 correction doses only  RECENT history:    She has been on basal bolus insulin regimen with twice a day Levemir previously, using 38 units per day of basal insulin and 54 units a day of mealtime insulin She has not been seen in follow-up since 03/2014 because of recurrent hospitalizations, gynecological surgery and nursing home and rehabilitation stays Recently she had an admission for gastroenteritis and was discharged about 6 days ago Because of problems with hypoglycemia in the hospital she was told to reduce her insulin doses significantly and was given only sliding scale for her Humalog She is however having significantly high blood sugars except when she takes extra insulin for high readings Although overall her appetite is somewhat less she is taking Glucerna with meals and usually eating well in the evenings including last night She has not been checking readings after dinner recently because of using sliding scale Her husband and daughter are helping her with day-to-day point She has had only one reading below 70 last month prior to her hospitalization when she was having vomiting  A1c has been consistently over 8% but significantly better on the last visit Proper timing of medications in relation to meals: Yes.          Monitors blood glucose:  3 times a day.    Glucometer: One Touch.          Blood Glucose readings recently from meter download:    PREMEAL Breakfast Lunch Dinner Bedtime Overall  Glucose range: 157-268 66-385 82-304 186-316   Mean/median: 200 226 143 247 217    Meals: 3 meals per day.  for breakfast  will have an egg at times  Suppertime about 5:30-6:30 p.m.    Physical activity:  none           Complications: are: Nephropathy    Eye exams: Have been regular   Wt Readings from Last 3 Encounters:  10/18/14 127 lb (57.607 kg)  08/06/14 131 lb 12.8 oz (59.784 kg)  07/10/14 130 lb (58.968 kg)   DIABETES labs  Lab Results  Component Value Date   HGBA1C 6.9* 05/04/2014   HGBA1C 7.9* 03/04/2014   HGBA1C 8.9* 11/24/2013   Lab Results  Component Value Date   MICROALBUR 3.8* 04/06/2014   LDLCALC 52 04/06/2014   CREATININE 1.05 10/22/2014      Medication List       This list is accurate as of: 10/27/14 10:52 AM.  Always use your most recent med list.               acetaminophen 325 MG tablet  Commonly known as:  TYLENOL  Take 2 tablets (650 mg total) by mouth every 6 (six) hours as needed for mild pain (or Fever >/= 101).     alum & mag hydroxide-simeth 200-200-20 MG/5ML suspension  Commonly known as:  MAALOX/MYLANTA  Take 30 mLs by mouth every 6 (six) hours as needed for indigestion or heartburn (dyspepsia).  atorvastatin 20 MG tablet  Commonly known as:  LIPITOR  Take 20 mg by mouth daily.     calcitRIOL 0.25 MCG capsule  Commonly known as:  ROCALTROL  Take 0.25 mcg by mouth every Monday, Wednesday, and Friday.     cholecalciferol 1000 UNITS tablet  Commonly known as:  VITAMIN D  Take 1,000 Units by mouth daily.     diltiazem 180 MG 24 hr capsule  Commonly known as:  CARDIZEM CD  Take 180 mg by mouth daily.     docusate sodium 100 MG capsule  Commonly known as:  COLACE  Take 100 mg by mouth 2 (two) times daily.     feeding supplement (GLUCERNA SHAKE) Liqd  Take 237 mLs by mouth 3 (three) times daily between meals.     feeding supplement (PRO-STAT SUGAR FREE 64) Liqd  Place 30  mLs into feeding tube daily at 3 pm.     fluconazole 50 MG tablet  Commonly known as:  DIFLUCAN  Take 1 tablet (50 mg total) by mouth daily.     furosemide 20 MG tablet  Commonly known as:  LASIX  Take 20 mg by mouth daily.     insulin aspart 100 UNIT/ML injection  Commonly known as:  novoLOG  Inject 0-15 Units into the skin 3 (three) times daily with meals.     insulin detemir 100 UNIT/ML injection  Commonly known as:  LEVEMIR  Inject 0.05 mLs (5 Units total) into the skin 2 (two) times daily.     levothyroxine 100 MCG tablet  Commonly known as:  SYNTHROID, LEVOTHROID  Take 100 mcg by mouth as directed. Every day except sunday     metoprolol 50 MG tablet  Commonly known as:  LOPRESSOR  Take 50 mg by mouth 2 (two) times daily.     nystatin cream  Commonly known as:  MYCOSTATIN  Apply topically 3 (three) times daily.     pantoprazole 40 MG tablet  Commonly known as:  PROTONIX  Take 40 mg by mouth 2 (two) times daily.     polyethylene glycol packet  Commonly known as:  MIRALAX / GLYCOLAX  Take 17 g by mouth daily.     potassium chloride 10 MEQ tablet  Commonly known as:  K-DUR  Take 10 mEq by mouth daily.     traMADol 50 MG tablet  Commonly known as:  ULTRAM  Take 50 mg by mouth every 6 (six) hours as needed for moderate pain.     vitamin B-12 1000 MCG tablet  Commonly known as:  CYANOCOBALAMIN  Take 1,000 mcg by mouth daily.     warfarin 4 MG tablet  Commonly known as:  COUMADIN  Take 1 tablet (4 mg total) by mouth one time only at 6 PM.        Allergies:  Allergies  Allergen Reactions  . Tenex [Guanfacine Hcl] Other (See Comments)    hypotension  . Crestor [Rosuvastatin] Other (See Comments)    Myalgia    Past Medical History  Diagnosis Date  . Hypertension   . Hyperlipidemia   . Obesity   . Hypothyroidism   . Vulvar cancer, carcinoma 11/03/2011  . Atrial fibrillation   . Cancer 2007    vulva-invasive well diff squamous cell ca  . CVA  (cerebral infarction)   . Dyslipidemia   . History of radiation therapy 10/20/2013-12/01/2013    60 gray to perineum area and lower pelvis  . Diabetes mellitus 05-04-14    "Diabetic  coma" 1 week ago -tx. [facility] residence until pt. feeling improved and stable.  . Shortness of breath     occ. with coughing up phelgm usually mornings  . 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 Abnormal voice 05-04-14    "raspy voice" with inspiratory effort-Dr. Lissa Hoard aware 05-04-14  . Stroke 05/2000    right brain CVA pre H&P 2001-  . Anemia   . Macular degeneration   . Postoperative wound infection 07/01/2014  . Community acquired pneumonia 08/09/2012  . Corneal thinning 11/08/2012  . GI bleed 05/16/2014  . PUD (peptic ulcer disease) 05/26/2014  . Type II or unspecified type diabetes mellitus with renal manifestations, uncontrolled(250.42) 08/10/2012  . CKD (chronic kidney disease), stage III     Past Surgical History  Procedure Laterality Date  . Vulvar lesion removal  2012  . Breast surgery      Lumpectomy  . Dilation and curettage of uterus    . Cystourethroplasty / ureteroneocystostomy    . Radical vulvectomy    . Lymphadenectomy  2007    R inguinal femoral  . Radical vulvectomy  02/27/12  . Vulvar lesion removal  02/27/2012    Procedure: VULVAR LESION;  Surgeon: Alvino Chapel, MD;  Location: [facility];  Service: Gynecology;  Laterality: N/A;  . Right carotid endarterctomy    . Left eye surgery      , permanently stitched eye together  . Vulvectomy N/A 05/12/2014    Procedure: RADICAL VULVECTOMY WITH COLOSTOMY;  Surgeon: Alvino Chapel, MD;  Location: [facility];  Service: Gynecology;  Laterality: N/A;  . Laparotomy N/A 05/12/2014    Procedure: EXPLORATORY LAPAROTOMY, TOTAL ABDO MINAL HYSTERECTOMY AND BILATERAL SALPINGOOPHERECTOMY;  Surgeon: Alvino Chapel, MD;  Location: [facility];  Service: Gynecology;  Laterality: N/A;  . Esophagogastroduodenoscopy N/A 05/20/2014     Procedure: ESOPHAGOGASTRODUODENOSCOPY (EGD);  Surgeon: Jeryl Columbia, MD;  Location: [facility];  Service: Endoscopy;  Laterality: N/A;    Family History  Problem Relation Age of Onset  . Diabetes type II    . Coronary artery disease Mother   . Lung cancer Brother   . Diabetes type II Brother     Social History:  reports that she quit smoking about 47 years ago. Her smoking use included Cigarettes. She has a 4.5 pack-year smoking history. She has never used smokeless tobacco. She reports that she does not drink alcohol or use illicit drugs.  Review of Systems:  Recent hospitalization and discharge summary reviewed  HYPERTENSION:  her blood pressure  Has been controlled with diltiazem and metoprolol which are primarily being used for heart rate control. Blood pressure tends to be low normal   She has a history of atrial fibrillation, taking Coumadin. Does have a followup with cardiologist scheduled   HYPERLIPIDEMIA: well-controlled previously  She has a long-standing history of hypothyroidism   Lab Results  Component Value Date   TSH 0.90 04/06/2014      Chronic kidney disease/nephropathy: Has had mild increase in creatinine  which is recently better  Lab Results  Component Value Date   CREATININE 1.05 10/22/2014   She has history of proteinuria but microalbumin  more recently Normal Previous PTH 136, started on calcitriol 3 times a week     Last foot exam: 11/2013  No significant neuropathy on exam but she has decreased pulses  She has been using a colostomy recently Also using a catheter for bladder drainage Recently had  difficulties with cutaneous candidiasis and is being treated  She has had her influenza vaccine at the rehabilitation center    Examination:   BP   Pulse 114  Resp 16  Ht 5\' 3"  (1.6 m)  Wt   SpO2 95%  Body mass index is 0.00 kg/(m^2).   No pedal edema She is alert but not very communicative  ASSESSMENT/ PLAN:   Diabetes type 2    She has had significant variability in her blood sugars related to recent illness and variable food intake More recently with her Levemir dose being reduced sharply in the hospital she is getting hyperglycemia most of the day except when she takes extra insulin for high readings Although she is not eating consistently she is getting Glucerna for nutrition Her family is helping her with diabetes control and monitoring Currently not doing readings after meals especially after supper to help adjust her mealtime insulin requirement  Explained to the patient that since her fasting readings are getting progressively higher she will need to increase her twice a day Levemir at least 14 units twice a day She will also need a fixed coverage for her meals which will be easier for her to understand and follow Does not need aggressive control of her diabetes because of her age and multiple medical problems  She will increase her suppertime dose if postprandial readings are higher  New insulin doses as in patient instructions:  Hypothyroidism: Her TSH will be checked on her next visit;, has been compliant with her does of 100 mcg  Patient Instructions  Levemir 14 in the morning and 14 in the evening at dinner  Humalog 10-- 10 lunch --12 supper    Counseling time over 50% of today's 25 minute visit  Elira Colasanti 10/27/2014, 10:52 AM

## 2014-10-27 NOTE — Patient Instructions (Signed)
Levemir 14 in the morning and 14 in the evening at dinner  Humalog 10-- 10 lunch --12 supper

## 2014-10-29 ENCOUNTER — Other Ambulatory Visit: Payer: Self-pay | Admitting: *Deleted

## 2014-10-29 MED ORDER — WARFARIN SODIUM 4 MG PO TABS
4.0000 mg | ORAL_TABLET | ORAL | Status: DC
Start: 2014-10-29 — End: 2014-12-04

## 2014-10-30 ENCOUNTER — Other Ambulatory Visit: Payer: Self-pay | Admitting: Internal Medicine

## 2014-11-02 ENCOUNTER — Ambulatory Visit (INDEPENDENT_AMBULATORY_CARE_PROVIDER_SITE_OTHER): Payer: 59 | Admitting: Interventional Cardiology

## 2014-11-02 LAB — PROTIME-INR: INR: 3.2 — AB (ref 0.9–1.1)

## 2014-11-03 ENCOUNTER — Telehealth: Payer: Self-pay | Admitting: Endocrinology

## 2014-11-03 ENCOUNTER — Telehealth: Payer: Self-pay | Admitting: *Deleted

## 2014-11-03 NOTE — Telephone Encounter (Signed)
[name] called states" I'm [name] daughter in law, I need to make a f/u appt for her." .  Lindsay advised she goes to pt's house daily to care for her, runs errands, picks up her prescriptions etc, assists [name] with his care and pt's care. She also advised she has been to every doctors appt with [name].  Appt for Nov 20 at 0930 given to [place], no further concerns

## 2014-11-05 ENCOUNTER — Ambulatory Visit (INDEPENDENT_AMBULATORY_CARE_PROVIDER_SITE_OTHER): Payer: Medicare Other | Admitting: Pharmacist Clinician (PhC)/ Clinical Pharmacy Specialist

## 2014-11-05 ENCOUNTER — Other Ambulatory Visit: Payer: Self-pay | Admitting: Endocrinology

## 2014-11-05 DIAGNOSIS — R21 Rash and other nonspecific skin eruption: Secondary | ICD-10-CM

## 2014-11-05 LAB — POCT INR: INR: 3

## 2014-11-05 NOTE — Telephone Encounter (Signed)
I was able to schedule a dermatology appt for tomorrow, 11/06/14, at [facility], however patient's spouse stated they would not be able to make that appointment. He states he does not have anyone available tomorrow to help him and they will wait until next week's appointment to discuss further. They are requesting more cream for her rash.

## 2014-11-05 NOTE — Telephone Encounter (Signed)
Kim from [facility] stated that patient b/s is running from 200 to 300, she has also developed a bad rash. Call [phone] ask for kim

## 2014-11-05 NOTE — Telephone Encounter (Signed)
Nurse said her sugars have been really bad as stated below, she did not have a list of her readings. She also said the patients vulvar wound is not healing and seems to be getting worse.   She said patient has a "horrible" rash all over her body that is getting worse as well, she said it appears to be "yeasty"  Please advise

## 2014-11-05 NOTE — Telephone Encounter (Signed)
Increase Levemir to 18 bid from 14; will need to see Derm. Dr for rash, consult will be needed asap

## 2014-11-06 NOTE — Telephone Encounter (Signed)
Please see below.

## 2014-11-06 NOTE — Telephone Encounter (Signed)
Cannot prescribe medication without knowing the cause.  Is she having itching

## 2014-11-09 ENCOUNTER — Ambulatory Visit (INDEPENDENT_AMBULATORY_CARE_PROVIDER_SITE_OTHER): Payer: Medicare Other | Admitting: Cardiology

## 2014-11-09 LAB — POCT INR: INR: 2

## 2014-11-10 ENCOUNTER — Ambulatory Visit (INDEPENDENT_AMBULATORY_CARE_PROVIDER_SITE_OTHER): Payer: Medicare Other | Admitting: Cardiology

## 2014-11-10 ENCOUNTER — Encounter: Payer: Self-pay | Admitting: Cardiology

## 2014-11-10 VITALS — BP 96/64 | HR 81 | Ht 63.0 in | Wt 126.0 lb

## 2014-11-10 DIAGNOSIS — I4819 Other persistent atrial fibrillation: Secondary | ICD-10-CM

## 2014-11-10 DIAGNOSIS — C519 Malignant neoplasm of vulva, unspecified: Secondary | ICD-10-CM

## 2014-11-10 DIAGNOSIS — I481 Persistent atrial fibrillation: Secondary | ICD-10-CM

## 2014-11-10 DIAGNOSIS — I639 Cerebral infarction, unspecified: Secondary | ICD-10-CM

## 2014-11-10 DIAGNOSIS — I10 Essential (primary) hypertension: Secondary | ICD-10-CM

## 2014-11-10 NOTE — Patient Instructions (Signed)
Please stop your Furosemide and potassium. Continue all other medications as listed.  Follow up in 6 months with Dr. Marlou Porch.  You will receive a letter in the mail 2 months before you are due.  Please call us when you receive this letter to schedule your follow up appointment.

## 2014-11-10 NOTE — Progress Notes (Signed)
Marland KitchenMarland KitchenMarland Kitchen.........................................[name]     [facility]. [address] [place], [place]  [address] Phone: [phone] Fax:  [phone]  Date:  11/10/2014   ID:  [name], DOB 29-Jun-1926, MRN [id number]  PCP:  Elayne Snare, MD   History of Present Illness: [name] is a 80 y.o. female atrial fibrillation, chronic anticoagulation, chronic shortness of breath with invasive vulvar cancer which required extensive surgery. Colostomy, Foley.  Overall, she has lost a mild amount of weight. Her husband and daughter are intramural in her care. Her husband developed right leg foot drop.  She has had home health nurse, advanced home care, checking her Coumadin recently. They like this service.  She is having no chest pain, no significant shortness of breath.  Previously  she had a period of hypoglycemia, EMS was called. Glucose was administered. She was fairly unresponsive on the couch, saliva drooling from her mouth. Her husband called EMS. He will be contacting Dr. Dwyane Dee. Blood sugar today was 108.  Wt Readings from Last 3 Encounters:  11/10/14 126 lb (57.153 kg)  10/18/14 127 lb (57.607 kg)  08/06/14 131 lb 12.8 oz (59.784 kg)     Past Medical History  Diagnosis Date  . Hypertension   . Hyperlipidemia   . Obesity   . Hypothyroidism   . Vulvar cancer, carcinoma 11/03/2011  . Atrial fibrillation   . Cancer 2007    vulva-invasive well diff squamous cell ca  . CVA (cerebral infarction)   . Dyslipidemia   . History of radiation therapy 10/20/2013-12/01/2013    60 gray to perineum area and lower pelvis  . Diabetes mellitus 05-04-14    "Diabetic coma" 1 week ago -tx. by EMT [facility] residence until pt. feeling improved and stable.  . Shortness of breath     occ. with coughing up phelgm usually mornings  . 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 Abnormal voice 05-04-14    "raspy voice" with inspiratory effort-Dr. Lissa Hoard aware 05-04-14  . Stroke 05/2000   right brain CVA pre H&P 2001-  . Anemia   . Macular degeneration   . Postoperative wound infection 07/01/2014  . Community acquired pneumonia 08/09/2012  . Corneal thinning 11/08/2012  . GI bleed 05/16/2014  . PUD (peptic ulcer disease) 05/26/2014  . Type II or unspecified type diabetes mellitus with renal manifestations, uncontrolled(250.42) 08/10/2012  . CKD (chronic kidney disease), stage III     Past Surgical History  Procedure Laterality Date  . Vulvar lesion removal  2012  . Breast surgery      Lumpectomy  . Dilation and curettage of uterus    . Cystourethroplasty / ureteroneocystostomy    . Radical vulvectomy    . Lymphadenectomy  2007    R inguinal femoral  . Radical vulvectomy  02/27/12  . Vulvar lesion removal  02/27/2012    Procedure: VULVAR LESION;  Surgeon: Alvino Chapel, MD;  Location: [facility];  Service: Gynecology;  Laterality: N/A;  . Right carotid endarterctomy    . Left eye surgery      , permanently stitched eye together  . Vulvectomy N/A 05/12/2014    Procedure: RADICAL VULVECTOMY WITH COLOSTOMY;  Surgeon: Alvino Chapel, MD;  Location: [facility];  Service: Gynecology;  Laterality: N/A;  . Laparotomy N/A 05/12/2014    Procedure: EXPLORATORY LAPAROTOMY, TOTAL ABDO MINAL HYSTERECTOMY AND BILATERAL SALPINGOOPHERECTOMY;  Surgeon: Alvino Chapel, MD;  Location: [facility];  Service: Gynecology;  Laterality: N/A;  .  Esophagogastroduodenoscopy N/A 05/20/2014    Procedure: ESOPHAGOGASTRODUODENOSCOPY (EGD);  Surgeon: Jeryl Columbia, MD;  Location: [facility];  Service: Endoscopy;  Laterality: N/A;    Current Outpatient Prescriptions  Medication Sig Dispense Refill  . acetaminophen (TYLENOL) 325 MG tablet Take 2 tablets (650 mg total) by mouth every 6 (six) hours as needed for mild pain (or Fever >/= 101). 30 tablet 0  . atorvastatin (LIPITOR) 20 MG tablet Take 20 mg by mouth daily.    . calcitRIOL (ROCALTROL) 0.25 MCG capsule Take 0.25 mcg by mouth every  Monday, Wednesday, and Friday.    . Cholecalciferol (VITAMIN D-3) 1000 UNITS CAPS Take 1,000 Units by mouth daily.    [name] diltiazem (CARDIZEM CD) 180 MG 24 hr capsule Take 180 mg by mouth daily.     . diphenhydrAMINE (BENADRYL) 25 MG tablet Take 25 mg by mouth as needed.    . docusate sodium (COLACE) 100 MG capsule Take 100 mg by mouth 2 (two) times daily.    . feeding supplement, GLUCERNA SHAKE, (GLUCERNA SHAKE) LIQD Take 237 mLs by mouth 3 (three) times daily between meals. 237 mL 0  . fluconazole (DIFLUCAN) 50 MG tablet Take 1 tablet (50 mg total) by mouth daily. 14 tablet 0  . furosemide (LASIX) 20 MG tablet Take 20 mg by mouth daily.    . insulin detemir (LEVEMIR) 100 UNIT/ML injection Inject 0.05 mLs (5 Units total) into the skin 2 (two) times daily. (Patient taking differently: Inject 18 Units into the skin 2 (two) times daily. ) 10 mL 2  . insulin lispro (HUMALOG) 100 UNIT/ML injection Inject 10 Units into the skin 2 (two) times a week. 10 units at lunch, 12 units at supper    . levothyroxine (SYNTHROID, LEVOTHROID) 100 MCG tablet Take 100 mcg by mouth as directed. Every day except sunday    . metoprolol (LOPRESSOR) 50 MG tablet Take 50 mg by mouth 2 (two) times daily.    [name] nystatin cream (MYCOSTATIN) Apply topically 3 (three) times daily. 30 g 0  . pantoprazole (PROTONIX) 40 MG tablet Take 40 mg by mouth 2 (two) times daily.    . polyethylene glycol (MIRALAX / GLYCOLAX) packet Take 17 g by mouth daily.    . potassium chloride (K-DUR,KLOR-CON) 10 MEQ tablet Take 10 mEq by mouth daily.    . traMADol (ULTRAM) 50 MG tablet Take 50 mg by mouth every 6 (six) hours as needed for moderate pain.    . vitamin B-12 (CYANOCOBALAMIN) 1000 MCG tablet Take 1,000 mcg by mouth daily.    [name] warfarin (COUMADIN) 4 MG tablet Take 1 tablet (4 mg total) by mouth as directed. 40 tablet 3   No current facility-administered medications for this visit.    Allergies:    Allergies  Allergen Reactions  . Tenex  [Guanfacine Hcl] Other (See Comments)    hypotension  . Crestor [Rosuvastatin] Other (See Comments)    Myalgia    Social History:  The patient  reports that she quit smoking about 47 years ago. Her smoking use included Cigarettes. She has a 4.5 pack-year smoking history. She has never used smokeless tobacco. She reports that she does not drink alcohol or use illicit drugs.   ROS:  Please see the history of present illness.   Denies any syncope, bleeding, orthopnea, PND  PHYSICAL EXAM: VS:  BP 96/64 mmHg  Pulse 81  Ht 5\' 3"  (1.6 m)  Wt 126 lb (57.153 kg)  BMI 22.33 kg/m2 Elderly, somewhat frail, in  wheelchair HEENT: Left eye slightly shut Neck: no JVD Cardiac:  Irregularly irregular, normal rate; no murmur Lungs:  Improved from prior breath sounds bilaterally, no increased respiratory effort at baseline ,no wheezing, rhonchi or rales Abd: soft, nontender, no hepatomegaly Ext: no edema Skin: warm and dry Neuro: no focal abnormalities noted  EKG: 04/29/14-atrial fibrillation heart rate 106, nonspecific ST changes, no significant change from prior, heart rate slightly elevated.    ASSESSMENT AND PLAN:  1. Permanent atrial fibrillation-normal heart rate. Continue her metoprolol . Continue with diltiazem.  2. Chronic anticoagulation-stroke prevention.  She will need a Lovenox bridge prior to any surgical procedures. 3. Vulvar cancer-extensive sugergy. 4. Hypertension-excellent control, actually a little low today. I'm going to discontinue her furosemide 20 mg once a day as well as her potassium supplementation. 5. Hyperlipidemia-continue with low-dose statin. Doing very well. 6. Dyspnea-improved, Pulmonary exercises, watch for any worsening since we are stopping Lasix. 7. 6 month follow-up  Signed, Candee Furbish, MD [facility]  11/10/2014 2:43 PM

## 2014-11-11 ENCOUNTER — Other Ambulatory Visit: Payer: Self-pay

## 2014-11-12 ENCOUNTER — Ambulatory Visit (INDEPENDENT_AMBULATORY_CARE_PROVIDER_SITE_OTHER): Payer: Medicare Other | Admitting: Endocrinology

## 2014-11-12 ENCOUNTER — Encounter: Payer: Self-pay | Admitting: Endocrinology

## 2014-11-12 VITALS — BP 118/64 | HR 93 | Temp 97.9°F | Resp 14 | Ht 63.0 in | Wt 126.0 lb

## 2014-11-12 DIAGNOSIS — B354 Tinea corporis: Secondary | ICD-10-CM

## 2014-11-12 DIAGNOSIS — E039 Hypothyroidism, unspecified: Secondary | ICD-10-CM

## 2014-11-12 DIAGNOSIS — I639 Cerebral infarction, unspecified: Secondary | ICD-10-CM

## 2014-11-12 DIAGNOSIS — IMO0002 Reserved for concepts with insufficient information to code with codable children: Secondary | ICD-10-CM

## 2014-11-12 DIAGNOSIS — E1165 Type 2 diabetes mellitus with hyperglycemia: Secondary | ICD-10-CM

## 2014-11-12 MED ORDER — NYSTATIN 100000 UNIT/GM EX CREA: 1.0000 "application " | TOPICAL_CREAM | Freq: Two times a day (BID) | CUTANEOUS | Status: AC

## 2014-11-12 NOTE — Progress Notes (Signed)
Patient ID: [name], female   DOB: [birth year], 80 y.o.   MRN: [id number]   Reason for Appointment: follow-up of diabetes  History of Present Illness   Diagnosis: Type 2 DIABETES MELITUS, long-standing  Previous history: She has been on insulin for several years and usually is compliant with her basal bolus regimen. However she usually has significant fluctuation in her blood sugars and difficult to have consistent trend. Overall blood sugars are usually high after meals  Insulin regimen: Levemir 18 units twice a day and Humalog 10-10- correction doses only  RECENT history:    She has been on basal bolus insulin regimen with twice a day Levemir previously, using 38 units per day of basal insulin and 54 units a day of mealtime insulin  Because of problems with hypoglycemia in the hospital her insulin doses we reduced significantly  She was started back on a basal bolus insulin regimen on her follow-up visit although still on relatively low doses compared to a previous requirement Her home nurse called about her blood sugars being high about 10 days ago with readings as high as 326 Her Levemir was increased by 4 units twice a day However although her sugars are much better she is tending to have relatively low sugars before supper time Her appetite is somewhat decreased and she does take some Glucerna when she is not able to eat Blood sugars are variable but generally in the 100-160 range recently at other times  A1c has been consistently over 8% but significantly better on the last visit Proper timing of medications in relation to meals: Yes.          Monitors blood glucose:  3 times a day.    Glucometer: One Touch.          Blood Glucose readings recently from meter download:   FASTING average 148; lowest reading 61 at 6 PM and recent range 82-211 at lunch Median reading for the last 2 weeks = 151  Meals: 3 meals per day.  for breakfast  will have an egg at times  Suppertime about  5:30-6:30 p.m.    Physical activity:  none           Complications: are: Nephropathy    Eye exams: Have been regular   Wt Readings from Last 3 Encounters:  11/12/14 126 lb (57.153 kg)  11/10/14 126 lb (57.153 kg)  10/18/14 127 lb (57.607 kg)   DIABETES labs  Lab Results  Component Value Date   HGBA1C 6.9* 05/04/2014   HGBA1C 7.9* 03/04/2014   HGBA1C 8.9* 11/24/2013   Lab Results  Component Value Date   MICROALBUR 3.8* 04/06/2014   LDLCALC 52 04/06/2014   CREATININE 1.05 10/22/2014      Medication List       This list is accurate as of: 11/12/14  1:42 PM.  Always use your most recent med list.               acetaminophen 325 MG tablet  Commonly known as:  TYLENOL  Take 2 tablets (650 mg total) by mouth every 6 (six) hours as needed for mild pain (or Fever >/= 101).     atorvastatin 20 MG tablet  Commonly known as:  LIPITOR  Take 20 mg by mouth daily.     calcitRIOL 0.25 MCG capsule  Commonly known as:  ROCALTROL  Take 0.25 mcg by mouth every Monday, Wednesday, and Friday.     diltiazem 180 MG 24 hr capsule  Commonly known as:  CARDIZEM CD  Take 180 mg by mouth daily.     diphenhydrAMINE 25 MG tablet  Commonly known as:  BENADRYL  Take 25 mg by mouth as needed.     docusate sodium 100 MG capsule  Commonly known as:  COLACE  Take 100 mg by mouth 2 (two) times daily.     feeding supplement (GLUCERNA SHAKE) Liqd  Take 237 mLs by mouth 3 (three) times daily between meals.     fluconazole 50 MG tablet  Commonly known as:  DIFLUCAN  Take 1 tablet (50 mg total) by mouth daily.     insulin detemir 100 UNIT/ML injection  Commonly known as:  LEVEMIR  Inject 0.05 mLs (5 Units total) into the skin 2 (two) times daily.     insulin lispro 100 UNIT/ML injection  Commonly known as:  HUMALOG  Inject 10-12 Units into the skin 2 (two) times a week. 10 units at lunch, 12 units at supper     levothyroxine 100 MCG tablet  Commonly known as:  SYNTHROID, LEVOTHROID   Take 100 mcg by mouth as directed. Every day except sunday     metoprolol 50 MG tablet  Commonly known as:  LOPRESSOR  Take 50 mg by mouth 2 (two) times daily.     nystatin cream  Commonly known as:  MYCOSTATIN  Apply topically 3 (three) times daily.     pantoprazole 40 MG tablet  Commonly known as:  PROTONIX  Take 40 mg by mouth 2 (two) times daily.     polyethylene glycol packet  Commonly known as:  MIRALAX / GLYCOLAX  Take 17 g by mouth daily.     traMADol 50 MG tablet  Commonly known as:  ULTRAM  Take 50 mg by mouth every 6 (six) hours as needed for moderate pain.     vitamin B-12 1000 MCG tablet  Commonly known as:  CYANOCOBALAMIN  Take 1,000 mcg by mouth daily.     Vitamin D-3 1000 UNITS Caps  Take 1,000 Units by mouth daily.     warfarin 4 MG tablet  Commonly known as:  COUMADIN  Take 1 tablet (4 mg total) by mouth as directed.        Allergies:  Allergies  Allergen Reactions  . Tenex [Guanfacine Hcl] Other (See Comments)    hypotension  . Crestor [Rosuvastatin] Other (See Comments)    Myalgia    Past Medical History  Diagnosis Date  . Hypertension   . Hyperlipidemia   . Obesity   . Hypothyroidism   . Vulvar cancer, carcinoma 11/03/2011  . Atrial fibrillation   . Cancer 2007    vulva-invasive well diff squamous cell ca  . CVA (cerebral infarction)   . Dyslipidemia   . History of radiation therapy 10/20/2013-12/01/2013    60 gray to perineum area and lower pelvis  . Diabetes mellitus 05-04-14    "Diabetic coma" 1 week ago -tx. by EMT At residence until pt. feeling improved and stable.  . Shortness of breath     occ. with coughing up phelgm usually mornings  . 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 Abnormal voice 05-04-14    "raspy voice" with inspiratory effort-Dr. Lissa Hoard aware 05-04-14  . Stroke 05/2000    right brain CVA pre H&P 2001-  . Anemia   . Macular degeneration   . Postoperative wound infection 07/01/2014  .  Community acquired pneumonia 08/09/2012  . Corneal thinning 11/08/2012  .  GI bleed 05/16/2014  . PUD (peptic ulcer disease) 05/26/2014  . Type II or unspecified type diabetes mellitus with renal manifestations, uncontrolled(250.42) 08/10/2012  . CKD (chronic kidney disease), stage III     Past Surgical History  Procedure Laterality Date  . Vulvar lesion removal  2012  . Breast surgery      Lumpectomy  . Dilation and curettage of uterus    . Cystourethroplasty / ureteroneocystostomy    . Radical vulvectomy    . Lymphadenectomy  2007    R inguinal femoral  . Radical vulvectomy  02/27/12  . Vulvar lesion removal  02/27/2012    Procedure: VULVAR LESION;  Surgeon: Alvino Chapel, MD;  Location: [facility];  Service: Gynecology;  Laterality: N/A;  . Right carotid endarterctomy    . Left eye surgery      , permanently stitched eye together  . Vulvectomy N/A 05/12/2014    Procedure: RADICAL VULVECTOMY WITH COLOSTOMY;  Surgeon: Alvino Chapel, MD;  Location: [facility];  Service: Gynecology;  Laterality: N/A;  . Laparotomy N/A 05/12/2014    Procedure: EXPLORATORY LAPAROTOMY, TOTAL ABDO MINAL HYSTERECTOMY AND BILATERAL SALPINGOOPHERECTOMY;  Surgeon: Alvino Chapel, MD;  Location: [facility];  Service: Gynecology;  Laterality: N/A;  . Esophagogastroduodenoscopy N/A 05/20/2014    Procedure: ESOPHAGOGASTRODUODENOSCOPY (EGD);  Surgeon: Jeryl Columbia, MD;  Location: [facility];  Service: Endoscopy;  Laterality: N/A;    Family History  Problem Relation Age of Onset  . Diabetes type II    . Coronary artery disease Mother   . Lung cancer Brother   . Diabetes type II Brother     Social History:  reports that she quit smoking about 47 years ago. Her smoking use included Cigarettes. She has a 4.5 pack-year smoking history. She has never used smokeless tobacco. She reports that she does not drink alcohol or use illicit drugs.  Review of Systems:  HYPERTENSION:  her blood pressure  Has been  controlled with diltiazem and metoprolol which are primarily being used for heart rate control. Blood pressure tends to be low normal and the cardiologist has stopped her diuretic because of this  She has a history of atrial fibrillation, taking Coumadin. Does have a followup with cardiologist scheduled   HYPERLIPIDEMIA: well-controlled previously  She has a long-standing history of hypothyroidism   Lab Results  Component Value Date   TSH 0.90 04/06/2014      Chronic kidney disease/nephropathy: Has had mild increase in creatinine  which is recently better  Lab Results  Component Value Date   CREATININE 1.05 10/22/2014   She has history of proteinuria but microalbumin  more recently Normal Previous PTH 136, started on calcitriol 3 times a week     Last foot exam: 11/2013  No significant neuropathy on exam but she has decreased pulses  She has been using a colostomy  Also using a catheter for bladder drainage  Recently had difficulties with cutaneous candidiasis and this is mostly on her back She thinks she has some persistent rash    Examination:   BP 118/64 mmHg  Pulse 93  Temp(Src) 97.9 F (36.6 C)  Resp 14  Ht 5\' 3"  (1.6 m)  Wt 126 lb (57.153 kg)  BMI 22.33 kg/m2  SpO2 99%  Body mass index is 22.33 kg/(m^2).   She has macular rash on her right arm Has a small patch of scaly redness on the right lateral lower chest  ASSESSMENT/ PLAN:   Diabetes type 2  She has had significant improvement in her blood sugars with increasing her insulin Although her intake is variable she is supplementing this with Glucerna which is not interfering with her control Since increasing her Levemir her blood sugars are lower at suppertime with mild hypoglycemia and will need to reduce the morning dose Will continue her mealtime insulin unchanged at breakfast and suppertime and reduce it at lunchtime to avoid-noon hypoglycemia Continue evening a Levemir unchanged since fasting blood  sugars are reasonably good without hypoglycemia overnight Insulin regimen and glucose monitoring as well as blood sugar targets were discussed with her husband and her daughter  Although she is overdue for an A1c this can be deferred to the next visit since her blood sugar targets are not aggressive at this time  Low blood pressure: This is improved with stopping her diuretic from cardiologist Heart rate appears to be controlled  Tinea corporis: She will restart the nystatin ointment as it appears to have helped  New insulin doses as in patient instructions:  Patient Instructions  Reduce Levemir in am to 14  Humalog 10 in am 8 lunch, 12 at suuper  If not eating full meal and sugar < 100 reduce Humalog by 4-6 units and give right after eating    Gerrett Loman 11/12/2014, 1:42 PM

## 2014-11-12 NOTE — Patient Instructions (Addendum)
Reduce Levemir in am to 14  Humalog 10 in am 8 lunch, 12 at suuper  If not eating full meal and sugar < 100 reduce Humalog by 4-6 units and give right after eating

## 2014-11-13 ENCOUNTER — Other Ambulatory Visit: Payer: Self-pay | Admitting: Internal Medicine

## 2014-11-13 ENCOUNTER — Telehealth: Payer: Self-pay | Admitting: *Deleted

## 2014-11-13 ENCOUNTER — Encounter: Payer: Self-pay | Admitting: Gynecology

## 2014-11-13 ENCOUNTER — Ambulatory Visit: Payer: Medicare Other | Attending: Gynecology | Admitting: Gynecology

## 2014-11-13 VITALS — BP 110/70 | HR 98 | Temp 97.4°F | Resp 18 | Ht 63.0 in | Wt 126.0 lb

## 2014-11-13 DIAGNOSIS — Z923 Personal history of irradiation: Secondary | ICD-10-CM | POA: Insufficient documentation

## 2014-11-13 DIAGNOSIS — Z933 Colostomy status: Secondary | ICD-10-CM

## 2014-11-13 DIAGNOSIS — Z794 Long term (current) use of insulin: Secondary | ICD-10-CM | POA: Insufficient documentation

## 2014-11-13 DIAGNOSIS — E1129 Type 2 diabetes mellitus with other diabetic kidney complication: Secondary | ICD-10-CM | POA: Diagnosis not present

## 2014-11-13 DIAGNOSIS — C519 Malignant neoplasm of vulva, unspecified: Secondary | ICD-10-CM

## 2014-11-13 DIAGNOSIS — Z87891 Personal history of nicotine dependence: Secondary | ICD-10-CM | POA: Insufficient documentation

## 2014-11-13 DIAGNOSIS — E039 Hypothyroidism, unspecified: Secondary | ICD-10-CM | POA: Diagnosis not present

## 2014-11-13 DIAGNOSIS — I4891 Unspecified atrial fibrillation: Secondary | ICD-10-CM | POA: Insufficient documentation

## 2014-11-13 DIAGNOSIS — Z7901 Long term (current) use of anticoagulants: Secondary | ICD-10-CM | POA: Insufficient documentation

## 2014-11-13 DIAGNOSIS — B3789 Other sites of candidiasis: Secondary | ICD-10-CM

## 2014-11-13 DIAGNOSIS — R32 Unspecified urinary incontinence: Secondary | ICD-10-CM | POA: Insufficient documentation

## 2014-11-13 DIAGNOSIS — E1165 Type 2 diabetes mellitus with hyperglycemia: Secondary | ICD-10-CM | POA: Insufficient documentation

## 2014-11-13 DIAGNOSIS — I10 Essential (primary) hypertension: Secondary | ICD-10-CM | POA: Insufficient documentation

## 2014-11-13 DIAGNOSIS — Z8673 Personal history of transient ischemic attack (TIA), and cerebral infarction without residual deficits: Secondary | ICD-10-CM | POA: Insufficient documentation

## 2014-11-13 DIAGNOSIS — Z433 Encounter for attention to colostomy: Secondary | ICD-10-CM

## 2014-11-13 DIAGNOSIS — Z79899 Other long term (current) drug therapy: Secondary | ICD-10-CM | POA: Insufficient documentation

## 2014-11-13 DIAGNOSIS — E785 Hyperlipidemia, unspecified: Secondary | ICD-10-CM | POA: Diagnosis not present

## 2014-11-13 DIAGNOSIS — N183 Chronic kidney disease, stage 3 (moderate): Secondary | ICD-10-CM | POA: Insufficient documentation

## 2014-11-13 DIAGNOSIS — N3945 Continuous leakage: Secondary | ICD-10-CM

## 2014-11-13 DIAGNOSIS — T8189XD Other complications of procedures, not elsewhere classified, subsequent encounter: Secondary | ICD-10-CM

## 2014-11-13 NOTE — Patient Instructions (Signed)
Plan to follow up in Jan 2016 or sooner if needed.  Happy Holidays!

## 2014-11-13 NOTE — Progress Notes (Signed)
Consult Note: Gyn-Onc   [name] 80 y.o. female  Chief Complaint  Patient presents with  . Vulvar Cancer  A assessment t: Recurrent vulvar cancer status post posterior exenteration.  Improved healing of vulvar defect. Improved pain control. Improved functional status. Chronic urinary incontinence with Foley catheter place  Plan: The Foley catheter will be changed by the home health nurse on a monthly basis. She return to see me shortly after Christmas. The husband will continue to manage her colostomy and pack the perineal wound which is now nearly healed.  Patient given a new prescription for tramadol 50 mg twice a day when necessary pain. She will continue to apply nystatin to the rash on her sacral area.   Interval History: Patient returns today for continuing followup.  The patient was recently hospitalized with urosepsis. This resolved with IV antibiotics and changing of her Foley catheter. During that time she developed a yeast dermatitis on her sacrum which is now being managed with nystatin cream.  Patient's husband is changing the colostomy bag and packing the vulva. Home health nurse is drawing INR twice a week, helping with dressing changes, and will change the Foley catheter on a monthly basis. The patient is ambulating using a walker at home.  Colostomy function appears to be normal.. She denies any vaginal bleeding. She denies any fever or chills. Appetite is good.  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 At that time she underwent a modified radical vulvectomy and right inguinal lymphadenectomy. Lymph nodes and surgical margins were negative. She subsequently developed a recurrence on the left vulva in October 2008 underwent left modified radical vulvectomy. Given her age and overall poor performance status we did not perform an inguinal lymphadenectomy.  The patient subsequently had a third recurrence of October 2009 and this was excised  and the rhomboid flap was used to fill the surgical defect. Surgical margins were again negative. Her most recent recurrences in September 2012 which is again excised.  She had a superficial wound separation which closed by secondary intent.   She had yet another recurrence in November 2014 treated with external beam radiation therapy because of the lesion location very close to the anus. She had a partial response but then had progressive disease around the anus with severe pain. As a palliative maneuver our only option was to perform a posterior pelvic exenteration on 05/12/2014. She her initial postoperative course was uncomplicated. She required prolonged stay nursing home but ultimately was discharged home. A chronic Foley catheter was left in place because of continuous urinary incontinence.     Review of System Ten point review of systems is negative except as noted above      Allergies  Allergen Reactions  . Tenex [Guanfacine Hcl] Other (See Comments)    hypotension  . Crestor [Rosuvastatin] Other (See Comments)    Myalgia    Past Medical History  Diagnosis Date  . Hypertension   . Hyperlipidemia   . Obesity   . Hypothyroidism   . Vulvar cancer, carcinoma 11/03/2011  . Atrial fibrillation   . Cancer 2007    vulva-invasive well diff squamous cell ca  . CVA (cerebral infarction)   . Dyslipidemia   . History of radiation therapy 10/20/2013-12/01/2013    60 gray to perineum area and lower pelvis  . Diabetes mellitus 05-04-14    "Diabetic coma" 1 week ago -tx. by EMT At residence until pt. feeling improved and stable.  . Shortness  of breath     occ. with coughing up phelgm usually mornings  . 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 Abnormal voice 05-04-14    "raspy voice" with inspiratory effort-Dr. Lissa Hoard aware 05-04-14  . Stroke 05/2000    right brain CVA pre H&P 2001-  . Anemia   . Macular degeneration   . Postoperative wound infection 07/01/2014  .  Community acquired pneumonia 08/09/2012  . Corneal thinning 11/08/2012  . GI bleed 05/16/2014  . PUD (peptic ulcer disease) 05/26/2014  . Type II or unspecified type diabetes mellitus with renal manifestations, uncontrolled(250.42) 08/10/2012  . CKD (chronic kidney disease), stage III     Past Surgical History  Procedure Laterality Date  . Vulvar lesion removal  2012  . Breast surgery      Lumpectomy  . Dilation and curettage of uterus    . Cystourethroplasty / ureteroneocystostomy    . Radical vulvectomy    . Lymphadenectomy  2007    R inguinal femoral  . Radical vulvectomy  02/27/12  . Vulvar lesion removal  02/27/2012    Procedure: VULVAR LESION;  Surgeon: Alvino Chapel, MD;  Location: [facility];  Service: Gynecology;  Laterality: N/A;  . Right carotid endarterctomy    . Left eye surgery      , permanently stitched eye together  . Vulvectomy N/A 05/12/2014    Procedure: RADICAL VULVECTOMY WITH COLOSTOMY;  Surgeon: Alvino Chapel, MD;  Location: [facility];  Service: Gynecology;  Laterality: N/A;  . Laparotomy N/A 05/12/2014    Procedure: EXPLORATORY LAPAROTOMY, TOTAL ABDO MINAL HYSTERECTOMY AND BILATERAL SALPINGOOPHERECTOMY;  Surgeon: Alvino Chapel, MD;  Location: [facility];  Service: Gynecology;  Laterality: N/A;  . Esophagogastroduodenoscopy N/A 05/20/2014    Procedure: ESOPHAGOGASTRODUODENOSCOPY (EGD);  Surgeon: Jeryl Columbia, MD;  Location: [facility];  Service: Endoscopy;  Laterality: N/A;    Current Outpatient Prescriptions  Medication Sig Dispense Refill  . acetaminophen (TYLENOL) 325 MG tablet Take 2 tablets (650 mg total) by mouth every 6 (six) hours as needed for mild pain (or Fever >/= 101). 30 tablet 0  . atorvastatin (LIPITOR) 20 MG tablet Take 20 mg by mouth daily.    . calcitRIOL (ROCALTROL) 0.25 MCG capsule Take 0.25 mcg by mouth every Monday, Wednesday, and Friday.    . Cholecalciferol (VITAMIN D-3) 1000 UNITS CAPS Take 1,000 Units by mouth daily.     [name] diltiazem (CARDIZEM CD) 180 MG 24 hr capsule Take 180 mg by mouth daily.     [name] docusate sodium (COLACE) 100 MG capsule Take 100 mg by mouth 2 (two) times daily.    . feeding supplement, GLUCERNA SHAKE, (GLUCERNA SHAKE) LIQD Take 237 mLs by mouth 3 (three) times daily between meals. 237 mL 0  . insulin detemir (LEVEMIR) 100 UNIT/ML injection Inject 0.05 mLs (5 Units total) into the skin 2 (two) times daily. (Patient taking differently: Inject 18 Units into the skin 2 (two) times daily. ) 10 mL 2  . insulin lispro (HUMALOG) 100 UNIT/ML injection Inject 10-12 Units into the skin 2 (two) times a week. 10 units at lunch, 12 units at supper    . levothyroxine (SYNTHROID, LEVOTHROID) 100 MCG tablet Take 100 mcg by mouth as directed. Every day except sunday    . metoprolol (LOPRESSOR) 50 MG tablet Take 50 mg by mouth 2 (two) times daily.    [name] nystatin cream (MYCOSTATIN) Apply 1 application topically 2 (two) times daily. 30 g 1  .  pantoprazole (PROTONIX) 40 MG tablet Take 40 mg by mouth 2 (two) times daily.    . polyethylene glycol (MIRALAX / GLYCOLAX) packet Take 17 g by mouth daily.    . traMADol (ULTRAM) 50 MG tablet Take 50 mg by mouth every 6 (six) hours as needed for moderate pain.    . vitamin B-12 (CYANOCOBALAMIN) 1000 MCG tablet Take 1,000 mcg by mouth daily.    [name] warfarin (COUMADIN) 4 MG tablet Take 1 tablet (4 mg total) by mouth as directed. 40 tablet 3  . diphenhydrAMINE (BENADRYL) 25 MG tablet Take 25 mg by mouth as needed.    . fluconazole (DIFLUCAN) 50 MG tablet Take 1 tablet (50 mg total) by mouth daily. 14 tablet 0   No current facility-administered medications for this visit.    History   Social History  . Marital Status: Married    Spouse Name: N/A    Number of Children: N/A  . Years of Education: N/A   Occupational History  . Not on file.   Social History Main Topics  . Smoking status: Former Smoker -- 0.25 packs/day for 18 years    Types: Cigarettes    Quit date:  11/03/1967  . Smokeless tobacco: Never Used  . Alcohol Use: No  . Drug Use: No  . Sexual Activity: Not Currently   Other Topics Concern  . Not on file   Social History Narrative   The patient lives with her husband.    Family History  Problem Relation Age of Onset  . Diabetes type II    . Coronary artery disease Mother   . Lung cancer Brother   . Diabetes type II Brother        Vitals: Blood pressure 110/70, pulse 98, temperature 97.4 F (36.3 C), temperature source Oral, resp. rate 18, height 5\' 3"  (1.6 m), weight 126 lb (57.153 kg).  Physical Exam: In general the patient is a pleasant elderly white female.  HEENT is negative except that her left eyelid has been partially closed surgically.. Neck is supple without thyromegaly.  There is no supraclavicular or inguinal adenopathy.  The abdomen is soft nontender no masses organomegaly or ascites is noted. Colostomy stoma appears healthy.  Pelvic exam EGBUS a Foley catheter is in place    The packing is removed from the perineal defect and tissues appear healthy and is 90% epithelialized. Margins of the wound appear healthy and noninfected.  The dressing is removed and a new dressings soaked in saline as replaced.   The presacral area looks good with minimal yeast dermatitis.   [name],Nelani Schmelzle L, MD 11/13/2014, 9:40 AM

## 2014-11-13 NOTE — Telephone Encounter (Signed)
Call placed to [place], lm with Maudie Mercury, office Manager for RN caring for pt. Pt seen by Dr. Fermin Schwab this am, continue dressing changes and foley care. Pt will f/u again Jan 2016. Kim advised she will give message to RN caring for pt.

## 2014-11-16 ENCOUNTER — Ambulatory Visit (INDEPENDENT_AMBULATORY_CARE_PROVIDER_SITE_OTHER): Payer: Medicare Other | Admitting: Pharmacist

## 2014-11-16 DIAGNOSIS — I639 Cerebral infarction, unspecified: Secondary | ICD-10-CM

## 2014-11-16 LAB — POCT INR: INR: 2.4

## 2014-11-17 ENCOUNTER — Encounter: Payer: Self-pay | Admitting: Cardiology

## 2014-11-23 ENCOUNTER — Telehealth: Payer: Self-pay | Admitting: Endocrinology

## 2014-11-23 NOTE — Telephone Encounter (Signed)
Noted message left on vm

## 2014-11-23 NOTE — Telephone Encounter (Signed)
She can continue physical therapy.  Needs to discuss low oxygen level with cardiologist or pulmonologist

## 2014-11-23 NOTE — Telephone Encounter (Signed)
Please see below and advise.

## 2014-11-23 NOTE — Telephone Encounter (Signed)
[facility], # [phone]  PT Jewel Baize would like auth for more visits for pt. FYI her oxygen is dropping it has done down to 80 and she cannot bring it higher than 95 without activity.

## 2014-11-24 ENCOUNTER — Emergency Department (HOSPITAL_COMMUNITY): Payer: Medicare Other

## 2014-11-24 ENCOUNTER — Encounter (HOSPITAL_COMMUNITY): Payer: Self-pay | Admitting: *Deleted

## 2014-11-24 ENCOUNTER — Inpatient Hospital Stay (HOSPITAL_COMMUNITY)
Admission: EM | Admit: 2014-11-24 | Discharge: 2014-11-27 | DRG: 291 | Disposition: A | Discharge status: Home / Self Care | Payer: Medicare Other | Attending: Internal Medicine | Admitting: Internal Medicine

## 2014-11-24 DIAGNOSIS — R7989 Other specified abnormal findings of blood chemistry: Secondary | ICD-10-CM | POA: Insufficient documentation

## 2014-11-24 DIAGNOSIS — I10 Essential (primary) hypertension: Secondary | ICD-10-CM

## 2014-11-24 DIAGNOSIS — I481 Persistent atrial fibrillation: Secondary | ICD-10-CM

## 2014-11-24 DIAGNOSIS — E1122 Type 2 diabetes mellitus with diabetic chronic kidney disease: Secondary | ICD-10-CM | POA: Diagnosis present

## 2014-11-24 DIAGNOSIS — E038 Other specified hypothyroidism: Secondary | ICD-10-CM

## 2014-11-24 DIAGNOSIS — Z87891 Personal history of nicotine dependence: Secondary | ICD-10-CM | POA: Diagnosis not present

## 2014-11-24 DIAGNOSIS — I480 Paroxysmal atrial fibrillation: Secondary | ICD-10-CM | POA: Diagnosis present

## 2014-11-24 DIAGNOSIS — E785 Hyperlipidemia, unspecified: Secondary | ICD-10-CM | POA: Diagnosis present

## 2014-11-24 DIAGNOSIS — E1129 Type 2 diabetes mellitus with other diabetic kidney complication: Secondary | ICD-10-CM | POA: Diagnosis present

## 2014-11-24 DIAGNOSIS — I129 Hypertensive chronic kidney disease with stage 1 through stage 4 chronic kidney disease, or unspecified chronic kidney disease: Secondary | ICD-10-CM | POA: Diagnosis present

## 2014-11-24 DIAGNOSIS — E669 Obesity, unspecified: Secondary | ICD-10-CM | POA: Diagnosis present

## 2014-11-24 DIAGNOSIS — Z8711 Personal history of peptic ulcer disease: Secondary | ICD-10-CM | POA: Diagnosis not present

## 2014-11-24 DIAGNOSIS — E870 Hyperosmolality and hypernatremia: Secondary | ICD-10-CM

## 2014-11-24 DIAGNOSIS — E43 Unspecified severe protein-calorie malnutrition: Secondary | ICD-10-CM | POA: Diagnosis present

## 2014-11-24 DIAGNOSIS — D649 Anemia, unspecified: Secondary | ICD-10-CM | POA: Diagnosis present

## 2014-11-24 DIAGNOSIS — Z794 Long term (current) use of insulin: Secondary | ICD-10-CM

## 2014-11-24 DIAGNOSIS — Z7901 Long term (current) use of anticoagulants: Secondary | ICD-10-CM | POA: Diagnosis not present

## 2014-11-24 DIAGNOSIS — Z8673 Personal history of transient ischemic attack (TIA), and cerebral infarction without residual deficits: Secondary | ICD-10-CM | POA: Diagnosis not present

## 2014-11-24 DIAGNOSIS — R001 Bradycardia, unspecified: Secondary | ICD-10-CM

## 2014-11-24 DIAGNOSIS — I509 Heart failure, unspecified: Secondary | ICD-10-CM | POA: Insufficient documentation

## 2014-11-24 DIAGNOSIS — Z6822 Body mass index (BMI) 22.0-22.9, adult: Secondary | ICD-10-CM

## 2014-11-24 DIAGNOSIS — R0602 Shortness of breath: Secondary | ICD-10-CM | POA: Diagnosis present

## 2014-11-24 DIAGNOSIS — Z66 Do not resuscitate: Secondary | ICD-10-CM | POA: Diagnosis present

## 2014-11-24 DIAGNOSIS — N183 Chronic kidney disease, stage 3 unspecified: Secondary | ICD-10-CM | POA: Diagnosis present

## 2014-11-24 DIAGNOSIS — I5033 Acute on chronic diastolic (congestive) heart failure: Secondary | ICD-10-CM | POA: Diagnosis present

## 2014-11-24 DIAGNOSIS — N189 Chronic kidney disease, unspecified: Secondary | ICD-10-CM

## 2014-11-24 DIAGNOSIS — E039 Hypothyroidism, unspecified: Secondary | ICD-10-CM | POA: Diagnosis present

## 2014-11-24 DIAGNOSIS — H3530 Unspecified macular degeneration: Secondary | ICD-10-CM | POA: Diagnosis present

## 2014-11-24 DIAGNOSIS — C519 Malignant neoplasm of vulva, unspecified: Secondary | ICD-10-CM | POA: Diagnosis present

## 2014-11-24 DIAGNOSIS — I4891 Unspecified atrial fibrillation: Secondary | ICD-10-CM | POA: Diagnosis present

## 2014-11-24 DIAGNOSIS — I350 Nonrheumatic aortic (valve) stenosis: Secondary | ICD-10-CM

## 2014-11-24 LAB — COMPREHENSIVE METABOLIC PANEL
ALBUMIN: 3.3 g/dL — AB (ref 3.5–5.2)
ALT: 45 U/L — ABNORMAL HIGH (ref 0–35)
AST: 64 U/L — AB (ref 0–37)
Alkaline Phosphatase: 95 U/L (ref 39–117)
Anion gap: 17 — ABNORMAL HIGH (ref 5–15)
BILIRUBIN TOTAL: 0.9 mg/dL (ref 0.3–1.2)
BUN: 40 mg/dL — AB (ref 6–23)
CHLORIDE: 111 meq/L (ref 96–112)
CO2: 20 mEq/L (ref 19–32)
CREATININE: 1.43 mg/dL — AB (ref 0.50–1.10)
Calcium: 9.5 mg/dL (ref 8.4–10.5)
GFR calc Af Amer: 37 mL/min — ABNORMAL LOW (ref >90)
GFR calc non Af Amer: 32 mL/min — ABNORMAL LOW (ref >90)
Glucose, Bld: 76 mg/dL (ref 70–99)
Potassium: 4.5 mEq/L (ref 3.7–5.3)
Sodium: 148 mEq/L — ABNORMAL HIGH (ref 137–147)
TOTAL PROTEIN: 7.2 g/dL (ref 6.0–8.3)

## 2014-11-24 LAB — BLOOD GAS, ARTERIAL
ACID-BASE DEFICIT: 2.3 mmol/L — AB (ref 0.0–2.0)
Bicarbonate: 20.7 mEq/L (ref 20.0–24.0)
Drawn by: 295031
O2 Content: 3.5 L/min
O2 Saturation: 94.4 %
PATIENT TEMPERATURE: 98.6
TCO2: 18.9 mmol/L (ref 0–100)
pCO2 arterial: 31.1 mmHg — ABNORMAL LOW (ref 35.0–45.0)
pH, Arterial: 7.437 (ref 7.350–7.450)
pO2, Arterial: 74.6 mmHg — ABNORMAL LOW (ref 80.0–100.0)

## 2014-11-24 LAB — BASIC METABOLIC PANEL
ANION GAP: 13 (ref 5–15)
BUN: 40 mg/dL — ABNORMAL HIGH (ref 6–23)
CALCIUM: 9.5 mg/dL (ref 8.4–10.5)
CO2: 25 mEq/L (ref 19–32)
Chloride: 108 mEq/L (ref 96–112)
Creatinine, Ser: 1.47 mg/dL — ABNORMAL HIGH (ref 0.50–1.10)
GFR calc Af Amer: 36 mL/min — ABNORMAL LOW (ref >90)
GFR, EST NON AFRICAN AMERICAN: 31 mL/min — AB (ref >90)
Glucose, Bld: 57 mg/dL — ABNORMAL LOW (ref 70–99)
POTASSIUM: 4.1 meq/L (ref 3.7–5.3)
Sodium: 146 mEq/L (ref 137–147)

## 2014-11-24 LAB — CBC WITH DIFFERENTIAL/PLATELET
BASOS ABS: 0 10*3/uL (ref 0.0–0.1)
BASOS PCT: 0 % (ref 0–1)
EOS ABS: 0.1 10*3/uL (ref 0.0–0.7)
Eosinophils Relative: 2 % (ref 0–5)
HEMATOCRIT: 34.7 % — AB (ref 36.0–46.0)
HEMOGLOBIN: 10.7 g/dL — AB (ref 12.0–15.0)
Lymphocytes Relative: 10 % — ABNORMAL LOW (ref 12–46)
Lymphs Abs: 0.8 10*3/uL (ref 0.7–4.0)
MCH: 31.8 pg (ref 26.0–34.0)
MCHC: 30.8 g/dL (ref 30.0–36.0)
MCV: 103.3 fL — ABNORMAL HIGH (ref 78.0–100.0)
MONOS PCT: 9 % (ref 3–12)
Monocytes Absolute: 0.7 10*3/uL (ref 0.1–1.0)
NEUTROS ABS: 6.4 10*3/uL (ref 1.7–7.7)
Neutrophils Relative %: 79 % — ABNORMAL HIGH (ref 43–77)
Platelets: 212 10*3/uL (ref 150–400)
RBC: 3.36 MIL/uL — ABNORMAL LOW (ref 3.87–5.11)
RDW: 19.1 % — AB (ref 11.5–15.5)
WBC: 8 10*3/uL (ref 4.0–10.5)

## 2014-11-24 LAB — I-STAT TROPONIN, ED: TROPONIN I, POC: 0 ng/mL (ref 0.00–0.08)

## 2014-11-24 LAB — URINALYSIS, ROUTINE W REFLEX MICROSCOPIC
Bilirubin Urine: NEGATIVE
GLUCOSE, UA: NEGATIVE mg/dL
HGB URINE DIPSTICK: NEGATIVE
Ketones, ur: NEGATIVE mg/dL
Nitrite: NEGATIVE
PH: 6 (ref 5.0–8.0)
Protein, ur: NEGATIVE mg/dL
SPECIFIC GRAVITY, URINE: 1.007 (ref 1.005–1.030)
Urobilinogen, UA: 0.2 mg/dL (ref 0.0–1.0)

## 2014-11-24 LAB — PROTIME-INR
INR: 3.11 — ABNORMAL HIGH (ref 0.00–1.49)
PROTHROMBIN TIME: 32.3 s — AB (ref 11.6–15.2)

## 2014-11-24 LAB — URINE MICROSCOPIC-ADD ON

## 2014-11-24 LAB — PRO B NATRIURETIC PEPTIDE: Pro B Natriuretic peptide (BNP): 9938 pg/mL — ABNORMAL HIGH (ref 0–450)

## 2014-11-24 LAB — GLUCOSE, CAPILLARY: GLUCOSE-CAPILLARY: 78 mg/dL (ref 70–99)

## 2014-11-24 LAB — I-STAT CG4 LACTIC ACID, ED: LACTIC ACID, VENOUS: 1.89 mmol/L (ref 0.5–2.2)

## 2014-11-24 LAB — D-DIMER, QUANTITATIVE: D-Dimer, Quant: 1.93 ug/mL-FEU — ABNORMAL HIGH (ref 0.00–0.48)

## 2014-11-24 MED ORDER — INSULIN ASPART 100 UNIT/ML ~~LOC~~ SOLN
0.0000 [IU] | Freq: Four times a day (QID) | SUBCUTANEOUS | Status: DC
  Administered 2014-11-25: 5 [IU] via SUBCUTANEOUS
  Administered 2014-11-25 – 2014-11-26 (×2): 3 [IU] via SUBCUTANEOUS
  Administered 2014-11-26: 5 [IU] via SUBCUTANEOUS
  Administered 2014-11-26: 3 [IU] via SUBCUTANEOUS
  Administered 2014-11-27: 5 [IU] via SUBCUTANEOUS
  Administered 2014-11-27: 3 [IU] via SUBCUTANEOUS
  Administered 2014-11-27: 2 [IU] via SUBCUTANEOUS

## 2014-11-24 MED ORDER — ACETAMINOPHEN 325 MG PO TABS
650.0000 mg | ORAL_TABLET | Freq: Four times a day (QID) | ORAL | Status: DC | PRN
  Administered 2014-11-27: 650 mg via ORAL
  Filled 2014-11-24: qty 2

## 2014-11-24 MED ORDER — ONDANSETRON HCL 4 MG/2ML IJ SOLN: 4.0000 mg | Freq: Four times a day (QID) | INTRAMUSCULAR | Status: DC | PRN

## 2014-11-24 MED ORDER — PANTOPRAZOLE SODIUM 40 MG PO TBEC
40.0000 mg | DELAYED_RELEASE_TABLET | Freq: Two times a day (BID) | ORAL | Status: DC
  Administered 2014-11-24 – 2014-11-27 (×6): 40 mg via ORAL
  Filled 2014-11-24 (×7): qty 1

## 2014-11-24 MED ORDER — DOCUSATE SODIUM 50 MG PO CAPS
50.0000 mg | ORAL_CAPSULE | Freq: Two times a day (BID) | ORAL | Status: DC
  Administered 2014-11-24 – 2014-11-27 (×6): 50 mg via ORAL
  Filled 2014-11-24 (×8): qty 1

## 2014-11-24 MED ORDER — CALCITRIOL 0.25 MCG PO CAPS
0.2500 ug | ORAL_CAPSULE | ORAL | Status: DC
  Administered 2014-11-25 – 2014-11-27 (×2): 0.25 ug via ORAL
  Filled 2014-11-24 (×2): qty 1

## 2014-11-24 MED ORDER — TRAMADOL HCL 50 MG PO TABS
50.0000 mg | ORAL_TABLET | Freq: Four times a day (QID) | ORAL | Status: DC | PRN
  Administered 2014-11-27: 50 mg via ORAL
  Filled 2014-11-24 (×2): qty 1

## 2014-11-24 MED ORDER — POLYETHYLENE GLYCOL 3350 17 G PO PACK
17.0000 g | PACK | Freq: Every day | ORAL | Status: DC | PRN
  Filled 2014-11-24: qty 1

## 2014-11-24 MED ORDER — SODIUM CHLORIDE 0.9 % IJ SOLN
3.0000 mL | Freq: Two times a day (BID) | INTRAMUSCULAR | Status: DC
  Administered 2014-11-24 – 2014-11-27 (×6): 3 mL via INTRAVENOUS

## 2014-11-24 MED ORDER — GLUCERNA SHAKE PO LIQD
237.0000 mL | Freq: Three times a day (TID) | ORAL | Status: DC
  Administered 2014-11-25 – 2014-11-27 (×4): 237 mL via ORAL
  Filled 2014-11-24 (×11): qty 237

## 2014-11-24 MED ORDER — WARFARIN - PHARMACIST DOSING INPATIENT: Freq: Every day | Status: DC

## 2014-11-24 MED ORDER — TRAMADOL HCL 50 MG PO TABS
50.0000 mg | ORAL_TABLET | Freq: Four times a day (QID) | ORAL | Status: DC | PRN
Start: 2014-11-24 — End: 2014-11-24

## 2014-11-24 MED ORDER — FUROSEMIDE 10 MG/ML IJ SOLN
60.0000 mg | Freq: Once | INTRAMUSCULAR | Status: AC
  Administered 2014-11-24: 60 mg via INTRAVENOUS
  Filled 2014-11-24: qty 8

## 2014-11-24 MED ORDER — ACETAMINOPHEN 325 MG PO TABS: 650.0000 mg | ORAL_TABLET | Freq: Four times a day (QID) | ORAL | Status: DC | PRN

## 2014-11-24 MED ORDER — NYSTATIN 100000 UNIT/GM EX CREA
1.0000 "application " | TOPICAL_CREAM | Freq: Two times a day (BID) | CUTANEOUS | Status: DC
  Administered 2014-11-24 – 2014-11-27 (×6): 1 via TOPICAL
  Filled 2014-11-24 (×2): qty 15

## 2014-11-24 MED ORDER — ATORVASTATIN CALCIUM 20 MG PO TABS
20.0000 mg | ORAL_TABLET | Freq: Every day | ORAL | Status: DC
  Administered 2014-11-25 – 2014-11-27 (×3): 20 mg via ORAL
  Filled 2014-11-24: qty 1
  Filled 2014-11-24: qty 2
  Filled 2014-11-24: qty 1

## 2014-11-24 MED ORDER — ACETAMINOPHEN 650 MG RE SUPP: 650.0000 mg | Freq: Four times a day (QID) | RECTAL | Status: DC | PRN

## 2014-11-24 MED ORDER — ONDANSETRON HCL 4 MG PO TABS: 4.0000 mg | ORAL_TABLET | Freq: Four times a day (QID) | ORAL | Status: DC | PRN

## 2014-11-24 MED ORDER — LEVOTHYROXINE SODIUM 100 MCG PO TABS
100.0000 ug | ORAL_TABLET | ORAL | Status: DC
  Administered 2014-11-25 – 2014-11-27 (×3): 100 ug via ORAL
  Filled 2014-11-24 (×4): qty 1
  Filled 2014-11-24: qty 2

## 2014-11-24 MED ORDER — CETYLPYRIDINIUM CHLORIDE 0.05 % MT LIQD
7.0000 mL | Freq: Two times a day (BID) | OROMUCOSAL | Status: DC
  Administered 2014-11-24 – 2014-11-27 (×6): 7 mL via OROMUCOSAL

## 2014-11-24 NOTE — ED Notes (Signed)
Bed: WA03 Expected date:  Expected time:  Means of arrival:  Comments: ems 

## 2014-11-24 NOTE — Progress Notes (Signed)
Hypoglycemic Event  CBG: 50  Treatment: 15 GM carbohydrate snack  Symptoms: None  Follow-up CBG: Time:2215 CBG Result:78  Possible Reasons for Event: Inadequate meal intake  Comments/MD notified:Elink    [name], [name]  Remember to initiate Hypoglycemia Order Set & complete

## 2014-11-24 NOTE — ED Notes (Signed)
Per EMS, pt from  Home, reports difficulty breathing x 3 days.  Unable to complete a full sentence.  Was seen here last week for UTI.  Lung sounds clear and sats 98% 4L- 86% RA.

## 2014-11-24 NOTE — Plan of Care (Signed)
Problem: Phase I Progression Outcomes Goal: Pain controlled with appropriate interventions Outcome: Completed/Met Date Met:  11/24/14 Goal: OOB as tolerated unless otherwise ordered Outcome: Progressing Bedrest ordered due to shortness of breath, will increase activity based on respiratory status Goal: Initial discharge plan identified Outcome: Completed/Met Date Met:  11/24/14 Goal: Voiding-avoid urinary catheter unless indicated Outcome: Not Applicable Date Met:  53/69/22 Pt has chronic foley in place Goal: Hemodynamically stable Outcome: Completed/Met Date Met:  11/24/14

## 2014-11-24 NOTE — ED Provider Notes (Signed)
CSN: [id number]     Arrival date & time 11/24/14  1746 History   First MD Initiated Contact with Patient 11/24/14 1812     Chief Complaint  Patient presents with  . Respiratory Distress   Patient is a 80 y.o. female presenting with shortness of breath. The history is provided by the patient.  Shortness of Breath Severity:  Moderate Onset quality:  Gradual Duration:  3 days Timing:  Constant Progression:  Worsening Chronicity:  New Relieved by:  Nothing Exacerbated by: lying flat. Ineffective treatments:  None tried Associated symptoms: no abdominal pain, no cough, no fever and no vomiting   Risk factors: no family hx of DVT and no hx of PE/DVT    the patient has difficulty speaking more than 1 or 2 words at a time. Patient's family state that this has been her speaking pattern for the last couple of months. This is not a new finding associated with her trouble breathing over the last few days. She has seen her cardiologist recently and had her diuretics discontinued.  Past Medical History  Diagnosis Date  . Hypertension   . Hyperlipidemia   . Obesity   . Hypothyroidism   . Vulvar cancer, carcinoma 11/03/2011  . Atrial fibrillation   . Cancer 2007    vulva-invasive well diff squamous cell ca  . CVA (cerebral infarction)   . Dyslipidemia   . History of radiation therapy 10/20/2013-12/01/2013    60 gray to perineum area and lower pelvis  . Diabetes mellitus 05-04-14    "Diabetic coma" 1 week ago -[facility]. [facility] until pt. feeling improved and stable.  . Shortness of breath     occ. with coughing up phelgm usually mornings  . 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 Abnormal voice 05-04-14    "raspy voice" with inspiratory effort-Dr. Lissa Hoard aware 05-04-14  . Stroke 05/2000    right brain CVA pre H&P 2001-  . Anemia   . Macular degeneration   . Postoperative wound infection 07/01/2014  . Community acquired pneumonia 08/09/2012  . Corneal thinning 11/08/2012   . GI bleed 05/16/2014  . PUD (peptic ulcer disease) 05/26/2014  . Type II or unspecified type diabetes mellitus with renal manifestations, uncontrolled(250.42) 08/10/2012  . CKD (chronic kidney disease), stage III    Past Surgical History  Procedure Laterality Date  . Vulvar lesion removal  2012  . Breast surgery      Lumpectomy  . Dilation and curettage of uterus    . Cystourethroplasty / ureteroneocystostomy    . Radical vulvectomy    . Lymphadenectomy  2007    R inguinal femoral  . Radical vulvectomy  02/27/12  . Vulvar lesion removal  02/27/2012    Procedure: VULVAR LESION;  Surgeon: Alvino Chapel, MD;  Location: [facility];  Service: Gynecology;  Laterality: N/A;  . Right carotid endarterctomy    . Left eye surgery      , permanently stitched eye together  . Vulvectomy N/A 05/12/2014    Procedure: RADICAL VULVECTOMY WITH COLOSTOMY;  Surgeon: Alvino Chapel, MD;  Location: [facility];  Service: Gynecology;  Laterality: N/A;  . Laparotomy N/A 05/12/2014    Procedure: EXPLORATORY LAPAROTOMY, TOTAL ABDO MINAL HYSTERECTOMY AND BILATERAL SALPINGOOPHERECTOMY;  Surgeon: Alvino Chapel, MD;  Location: [facility];  Service: Gynecology;  Laterality: N/A;  . Esophagogastroduodenoscopy N/A 05/20/2014    Procedure: ESOPHAGOGASTRODUODENOSCOPY (EGD);  Surgeon: Jeryl Columbia, MD;  Location: [facility];  Service: Endoscopy;  Laterality: N/A;   Family History  Problem Relation Age of Onset  . Diabetes type II    . Coronary artery disease Mother   . Lung cancer Brother   . Diabetes type II Brother    History  Substance Use Topics  . Smoking status: Former Smoker -- 0.25 packs/day for 18 years    Types: Cigarettes    Quit date: 11/03/1967  . Smokeless tobacco: Never Used  . Alcohol Use: No   OB History    No data available     Review of Systems  Constitutional: Negative for fever.  Respiratory: Positive for shortness of breath. Negative for cough.   Gastrointestinal:  Negative for vomiting and abdominal pain.  All other systems reviewed and are negative.     Allergies  Tenex and Crestor  Home Medications   Prior to Admission medications   Medication Sig Start Date End Date Taking? Authorizing Provider  acetaminophen (TYLENOL) 325 MG tablet Take 2 tablets (650 mg total) by mouth every 6 (six) hours as needed for mild pain (or Fever >/= 101). 10/22/14  Yes Robbie Lis, MD  atorvastatin (LIPITOR) 20 MG tablet Take 20 mg by mouth daily.   Yes Historical Provider, MD  calcitRIOL (ROCALTROL) 0.25 MCG capsule Take 0.25 mcg by mouth every Monday, Wednesday, and Friday.   Yes Historical Provider, MD  Cholecalciferol (VITAMIN D-3) 1000 UNITS CAPS Take 1,000 Units by mouth daily.   Yes Historical Provider, MD  diltiazem (CARDIZEM CD) 180 MG 24 hr capsule Take 180 mg by mouth daily.  08/10/14  Yes Historical Provider, MD  diphenhydrAMINE (BENADRYL) 25 MG tablet Take 25 mg by mouth as needed.   Yes Historical Provider, MD  docusate sodium (COLACE) 100 MG capsule Take 100 mg by mouth 2 (two) times daily.   Yes Historical Provider, MD  feeding supplement, GLUCERNA SHAKE, (GLUCERNA SHAKE) LIQD Take 237 mLs by mouth 3 (three) times daily between meals. 10/22/14  Yes Robbie Lis, MD  fluconazole (DIFLUCAN) 50 MG tablet Take 1 tablet (50 mg total) by mouth daily. 10/22/14  Yes Robbie Lis, MD  insulin detemir (LEVEMIR) 100 UNIT/ML injection Inject 0.05 mLs (5 Units total) into the skin 2 (two) times daily. Patient taking differently: Inject 18 Units into the skin 2 (two) times daily. Patients husband judges how much insulin to give pt according to glucose lvl 10/22/14  Yes Robbie Lis, MD  insulin lispro (HUMALOG) 100 UNIT/ML injection Inject 8-10 Units into the skin 2 (two) times a week. 8 units at lunch, 10 units at supper   Yes Historical Provider, MD  levothyroxine (SYNTHROID, LEVOTHROID) 100 MCG tablet Take 100 mcg by mouth as directed. Every day except sunday    Yes Historical Provider, MD  metoprolol (LOPRESSOR) 50 MG tablet Take 50 mg by mouth 2 (two) times daily.   Yes Historical Provider, MD  nystatin cream (MYCOSTATIN) Apply 1 application topically 2 (two) times daily. 11/12/14  Yes Elayne Snare, MD  pantoprazole (PROTONIX) 40 MG tablet Take 40 mg by mouth 2 (two) times daily.   Yes Historical Provider, MD  polyethylene glycol (MIRALAX / GLYCOLAX) packet Take 17 g by mouth daily as needed for mild constipation, moderate constipation or severe constipation.    Yes Historical Provider, MD  traMADol (ULTRAM) 50 MG tablet Take 50 mg by mouth every 6 (six) hours as needed for moderate pain.   Yes Historical Provider, MD  vitamin B-12 (CYANOCOBALAMIN) 1000 MCG tablet Take  1,000 mcg by mouth daily.   Yes Historical Provider, MD  warfarin (COUMADIN) 4 MG tablet Take 1 tablet (4 mg total) by mouth as directed. 10/29/14  Yes Candee Furbish, MD   BP 144/48 mmHg  Pulse 42  Resp 0  SpO2 95% Physical Exam  Constitutional: She appears distressed.  HENT:  Head: Normocephalic and atraumatic.  Right Ear: External ear normal.  Left Ear: External ear normal.  Eyes: Conjunctivae are normal. Right eye exhibits no discharge. Left eye exhibits no discharge. No scleral icterus.  Neck: Neck supple. No tracheal deviation present.  Cardiovascular: Normal rate, regular rhythm and intact distal pulses.   Pulmonary/Chest: Effort normal. No stridor. No respiratory distress. She has no wheezes. She has rales.  Only speaks few words at a time, halting speach  Abdominal: Soft. Bowel sounds are normal. She exhibits no distension. There is no tenderness. There is no rebound and no guarding.  Musculoskeletal: She exhibits no edema or tenderness.  Neurological: She is alert. She has normal strength. No cranial nerve deficit (no facial droop, extraocular movements intact, no slurred speech) or sensory deficit. She exhibits normal muscle tone. She displays no seizure activity. Coordination  normal.  Skin: Skin is warm and dry. No rash noted. She is not diaphoretic.  Psychiatric: She has a normal mood and affect.  Nursing note and vitals reviewed.   ED Course  Procedures (including critical care time) Labs Review Labs Reviewed  CBC WITH DIFFERENTIAL - Abnormal; Notable for the following:    RBC 3.36 (*)    Hemoglobin 10.7 (*)    HCT 34.7 (*)    MCV 103.3 (*)    RDW 19.1 (*)    Neutrophils Relative % 79 (*)    Lymphocytes Relative 10 (*)    All other components within normal limits  COMPREHENSIVE METABOLIC PANEL - Abnormal; Notable for the following:    Sodium 148 (*)    BUN 40 (*)    Creatinine, Ser 1.43 (*)    Albumin 3.3 (*)    AST 64 (*)    ALT 45 (*)    GFR calc non Af Amer 32 (*)    GFR calc Af Amer 37 (*)    Anion gap 17 (*)    All other components within normal limits  D-DIMER, QUANTITATIVE - Abnormal; Notable for the following:    D-Dimer, Quant 1.93 (*)    All other components within normal limits  PRO B NATRIURETIC PEPTIDE - Abnormal; Notable for the following:    Pro B Natriuretic peptide (BNP) 9938.0 (*)    All other components within normal limits  BLOOD GAS, ARTERIAL - Abnormal; Notable for the following:    pCO2 arterial 31.1 (*)    pO2, Arterial 74.6 (*)    Acid-base deficit 2.3 (*)    All other components within normal limits  PROTIME-INR - Abnormal; Notable for the following:    Prothrombin Time 32.3 (*)    INR 3.11 (*)    All other components within normal limits  CULTURE, BLOOD (ROUTINE X 2)  CULTURE, BLOOD (ROUTINE X 2)  URINE CULTURE  URINALYSIS, ROUTINE W REFLEX MICROSCOPIC  I-STAT CG4 LACTIC ACID, ED  I-STAT TROPOININ, ED  I-STAT CG4 LACTIC ACID, ED    Imaging Review Dg Chest Port 1 View  11/24/2014   CLINICAL DATA:  80 year old female with extreme shortness of breath progressive over the past 3 days  EXAM: PORTABLE CHEST - 1 VIEW  COMPARISON:  Prior chest x-ray and abdominal  series 10 04/13/2014  FINDINGS: Stable  cardiomegaly. Increased pulmonary vascular congestion now with diffuse mild interstitial edema. Atherosclerotic calcification present within the transverse aorta. No focal airspace consolidation, pneumothorax or large effusion. No acute osseous abnormality.  IMPRESSION: Radiographic findings are most suggestive of mild -moderate CHF.   Electronically Signed   By: Jacqulynn Cadet M.D.   On: 11/24/2014 18:34     EKG Interpretation   Date/Time:  Tuesday November 24 2014 19:23:10 EST Ventricular Rate:  42 PR Interval:  157 QRS Duration: 86 QT Interval:  598 QTC Calculation: 500 R Axis:   86 Text Interpretation:  Sinus bradycardia , new since last tracing  Borderline right axis deviation Borderline repolarization abnormality  Borderline prolonged QT interval Confirmed by Wilbur Labuda  MD-J, Jyasia Markoff ([id number]) on  11/24/2014 7:53:02 PM     Medications  furosemide (LASIX) injection 60 mg (60 mg Intravenous Given 11/24/14 2015)    MDM   Final diagnoses:  Shortness of breath  Acute congestive heart failure, unspecified congestive heart failure type  Positive D dimer    Patient's chest x-ray is consistent with just heart failure. Patient does have an elevated BNP. She did have her Lasix discontinued. This may be contributing factor. The patient does have an elevated d-dimer and I will order a VQ scan.  She has a therapeutic INR.  Hold off on any additional anticoagulation.   Dorie Rank, MD 11/24/14 2027

## 2014-11-24 NOTE — H&P (Signed)
[facility] History and Physical  Patient: [name]  TGY:[id number]  DOB: 1926/01/10  DOS: the patient was seen and examined on 11/24/2014 PCP: Elayne Snare, MD  Chief Complaint: shortness of breath  HPI: [name] is a 80 y.o. female with Past medical history of vulvar carcinoma with multiple surgery with recent pelvic exenteration and an open vulvar wound with chronic indwelling Foley catheter as well as colostomy, chronic A. fib on Coumadin, burning diastolic heart failure with aortic stenosis recently off of Lasix, essential hypertension, hypothyroidism, history of CVA with residual speech abnormality, chronic kidney disease, type 2 diabetes mellitus. The patient is presenting with complaints of shortness of breath that has been ongoing since last 2 days. Patient was at her baseline but yesterday she has not been feeling good and has been complaining of shortness of breath throughout the day she also had some cough with without any significant expectoration. She was unable to sleep very quickly last night due to the breathing issues. Throughout the day her breathing was progressively worsening and she was having hard time breathing and in the afternoon she requested to be taken to the hospital. As per the husband the therapist that comes at home found that her oxygenation was not improving more than 87% on room air today. No complaints of chest pain, fever, chills, nausea, vomiting, choking episode, fall, injury, excessive discharge from the colostomy, axis urination, active bleeding. Recently 2 weeks ago patient was seen by her cardiologist and due to her low blood pressure her Lasix was stopped with that her blood pressure was improved. No other recent change in her medications reported. Until yesterday patient has been eating and drinking okay.  The patient is coming from home. And at her baseline dependent for most of her ADL.  Review of Systems: as mentioned in the history of  present illness.  A Comprehensive review of the other systems is negative.  Past Medical History  Diagnosis Date  . Hypertension   . Hyperlipidemia   . Obesity   . Hypothyroidism   . Vulvar cancer, carcinoma 11/03/2011  . Atrial fibrillation   . Cancer 2007    vulva-invasive well diff squamous cell ca  . CVA (cerebral infarction)   . Dyslipidemia   . History of radiation therapy 10/20/2013-12/01/2013    60 gray to perineum area and lower pelvis  . Diabetes mellitus 05-04-14    "Diabetic coma" 1 week ago -[facility]. [facility] residence until pt. feeling improved and stable.  . Shortness of breath     occ. with coughing up phelgm usually mornings  . 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 Abnormal voice 05-04-14    "raspy voice" with inspiratory effort-Dr. Lissa Hoard aware 05-04-14  . Stroke 05/2000    right brain CVA pre H&P 2001-  . Anemia   . Macular degeneration   . Postoperative wound infection 07/01/2014  . Community acquired pneumonia 08/09/2012  . Corneal thinning 11/08/2012  . GI bleed 05/16/2014  . PUD (peptic ulcer disease) 05/26/2014  . Type II or unspecified type diabetes mellitus with renal manifestations, uncontrolled(250.42) 08/10/2012  . CKD (chronic kidney disease), stage III    Past Surgical History  Procedure Laterality Date  . Vulvar lesion removal  2012  . Breast surgery      Lumpectomy  . Dilation and curettage of uterus    . Cystourethroplasty / ureteroneocystostomy    . Radical vulvectomy    . Lymphadenectomy  2007  R inguinal femoral  . Radical vulvectomy  02/27/12  . Vulvar lesion removal  02/27/2012    Procedure: VULVAR LESION;  Surgeon: Alvino Chapel, MD;  Location: [facility];  Service: Gynecology;  Laterality: N/A;  . Right carotid endarterctomy    . Left eye surgery      , permanently stitched eye together  . Vulvectomy N/A 05/12/2014    Procedure: RADICAL VULVECTOMY WITH COLOSTOMY;  Surgeon: Alvino Chapel, MD;  Location: [facility];  Service: Gynecology;  Laterality: N/A;  . Laparotomy N/A 05/12/2014    Procedure: EXPLORATORY LAPAROTOMY, TOTAL ABDO MINAL HYSTERECTOMY AND BILATERAL SALPINGOOPHERECTOMY;  Surgeon: Alvino Chapel, MD;  Location: [facility];  Service: Gynecology;  Laterality: N/A;  . Esophagogastroduodenoscopy N/A 05/20/2014    Procedure: ESOPHAGOGASTRODUODENOSCOPY (EGD);  Surgeon: Jeryl Columbia, MD;  Location: [facility];  Service: Endoscopy;  Laterality: N/A;   Social History:  reports that she quit smoking about 47 years ago. Her smoking use included Cigarettes. She has a 4.5 pack-year smoking history. She has never used smokeless tobacco. She reports that she does not drink alcohol or use illicit drugs.  Allergies  Allergen Reactions  . Tenex [Guanfacine Hcl] Other (See Comments)    hypotension  . Crestor [Rosuvastatin] Other (See Comments)    Myalgia    Family History  Problem Relation Age of Onset  . Diabetes type II    . Coronary artery disease Mother   . Lung cancer Brother   . Diabetes type II Brother     Prior to Admission medications   Medication Sig Start Date End Date Taking? Authorizing Provider  acetaminophen (TYLENOL) 325 MG tablet Take 2 tablets (650 mg total) by mouth every 6 (six) hours as needed for mild pain (or Fever >/= 101). 10/22/14  Yes Robbie Lis, MD  atorvastatin (LIPITOR) 20 MG tablet Take 20 mg by mouth daily.   Yes Historical Provider, MD  calcitRIOL (ROCALTROL) 0.25 MCG capsule Take 0.25 mcg by mouth every Monday, Wednesday, and Friday.   Yes Historical Provider, MD  Cholecalciferol (VITAMIN D-3) 1000 UNITS CAPS Take 1,000 Units by mouth daily.   Yes Historical Provider, MD  diltiazem (CARDIZEM CD) 180 MG 24 hr capsule Take 180 mg by mouth daily.  08/10/14  Yes Historical Provider, MD  diphenhydrAMINE (BENADRYL) 25 MG tablet Take 25 mg by mouth as needed.   Yes Historical Provider, MD  docusate sodium (COLACE) 50 MG capsule Take 50 mg by mouth 2 (two) times  daily.   Yes Historical Provider, MD  feeding supplement, GLUCERNA SHAKE, (GLUCERNA SHAKE) LIQD Take 237 mLs by mouth 3 (three) times daily between meals. 10/22/14  Yes Robbie Lis, MD  insulin detemir (LEVEMIR) 100 UNIT/ML injection Inject 0.05 mLs (5 Units total) into the skin 2 (two) times daily. Patient taking differently: Inject 14-18 Units into the skin 2 (two) times daily. Take 14 units in the morning, and 18 units at night. 10/22/14  Yes Robbie Lis, MD  insulin lispro (HUMALOG) 100 UNIT/ML injection Inject 8-12 Units into the skin 2 (two) times a week. 10 at breakfast, 8 units at lunch, 12 units at supper. Patients husband judges how much insulin to give pt according to glucose lvl as in if pt is not eating gives her a low dose   Yes Historical Provider, MD  levothyroxine (SYNTHROID, LEVOTHROID) 100 MCG tablet Take 100 mcg by mouth as directed. Every day except sunday   Yes Historical Provider, MD  metoprolol (LOPRESSOR)  50 MG tablet Take 50 mg by mouth 2 (two) times daily.   Yes Historical Provider, MD  nystatin cream (MYCOSTATIN) Apply 1 application topically 2 (two) times daily. Patient taking differently: Apply 1 application topically 2 (two) times daily as needed (rash).  11/12/14  Yes Elayne Snare, MD  pantoprazole (PROTONIX) 40 MG tablet Take 40 mg by mouth 2 (two) times daily.   Yes Historical Provider, MD  polyethylene glycol (MIRALAX / GLYCOLAX) packet Take 17 g by mouth daily as needed for mild constipation, moderate constipation or severe constipation.    Yes Historical Provider, MD  traMADol (ULTRAM) 50 MG tablet Take 50 mg by mouth every 6 (six) hours as needed for moderate pain.   Yes Historical Provider, MD  vitamin B-12 (CYANOCOBALAMIN) 1000 MCG tablet Take 1,000 mcg by mouth daily.   Yes Historical Provider, MD  warfarin (COUMADIN) 4 MG tablet Take 1 tablet (4 mg total) by mouth as directed. Patient taking differently: Take 2-4 mg by mouth as directed. Takes 4 mg everyday  except take 2 mg on Wednesday. 10/29/14  Yes Candee Furbish, MD    Physical Exam: Filed Vitals:   11/24/14 1800 11/24/14 1900 11/24/14 2000 11/24/14 2100  BP: 137/77 144/48 137/56 145/54  Pulse: 44 42 43   Resp: 13 0 16 14  SpO2:  95% 98%     General: Alert, Awake and Oriented to Time, Place and Person. Appear in moderate distress Eyes: PERRL ENT: Oral Mucosa clear moist. Neck: mild JVD Cardiovascular: S1 and S2 Present, aortic systolic Murmur, Peripheral Pulses Present Respiratory: Bilateral Air entry equal and Decreased, bilateral Crackles, no wheezes Abdomen: Bowel Sound present sluggish, Soft and non tender Skin: macular Rash o legs Extremities: no Pedal edema, no calf tenderness Neurologic: Grossly no focal neuro deficit.  Labs on Admission:  CBC:  Recent Labs Lab 11/24/14 1842  WBC 8.0  NEUTROABS 6.4  HGB 10.7*  HCT 34.7*  MCV 103.3*  PLT 212    CMP     Component Value Date/Time   NA 148* 11/24/2014 1842   K 4.5 11/24/2014 1842   CL 111 11/24/2014 1842   CO2 20 11/24/2014 1842   GLUCOSE 76 11/24/2014 1842   BUN 40* 11/24/2014 1842   CREATININE 1.43* 11/24/2014 1842   CALCIUM 9.5 11/24/2014 1842   PROT 7.2 11/24/2014 1842   ALBUMIN 3.3* 11/24/2014 1842   AST 64* 11/24/2014 1842   ALT 45* 11/24/2014 1842   ALKPHOS 95 11/24/2014 1842   BILITOT 0.9 11/24/2014 1842   GFRNONAA 32* 11/24/2014 1842   GFRAA 37* 11/24/2014 1842    No results for input(s): LIPASE, AMYLASE in the last 168 hours. No results for input(s): AMMONIA in the last 168 hours.  No results for input(s): CKTOTAL, CKMB, CKMBINDEX, TROPONINI in the last 168 hours. BNP (last 3 results)  Recent Labs  11/24/14 1845  PROBNP 9938.0*    Radiological Exams on Admission: Dg Chest Port 1 View  11/24/2014   CLINICAL DATA:  80 year old female with extreme shortness of breath progressive over the past 3 days  EXAM: PORTABLE CHEST - 1 VIEW  COMPARISON:  Prior chest x-ray and abdominal series 10  04/13/2014  FINDINGS: Stable cardiomegaly. Increased pulmonary vascular congestion now with diffuse mild interstitial edema. Atherosclerotic calcification present within the transverse aorta. No focal airspace consolidation, pneumothorax or large effusion. No acute osseous abnormality.  IMPRESSION: Radiographic findings are most suggestive of mild -moderate CHF.   Electronically Signed   By: Jacqulynn Cadet  M.D.   On: 11/24/2014 18:34   EKG: Independently reviewed. sinus tachycardia,diffuse t wave inversion.  Assessment/Plan Principal Problem:   Acute on chronic diastolic CHF (congestive heart failure) Active Problems:   Vulvar cancer, carcinoma   Hypothyroidism   Atrial fibrillation   Essential hypertension   Chronic kidney disease, stage III (moderate)   Chronic anticoagulation   Aortic stenosis   Type 2 diabetes mellitus with renal manifestations   Protein-calorie malnutrition, severe   Hypernatremia   Sinus bradycardia   1. Acute on chronic diastolic CHF (congestive heart failure) The patient is presenting with complaint of shortness of breath with cough without any expectoration. Chest x-ray shows mild CHF. ProBNP is elevated at 9000. She has bilateral rales with hypoxia. This suggest acute on chronic exacerbation of CHF more likely diastolic. Initial troponin is negative but EKG shows sinus bradycardia with diffuse T-wave inversions. With this findings the patient has currently received 60 mg of IV Lasix and has urinated 500 mL. We'll continue to closely monitor her ins and outs as well as daily weight. Reevaluate in morning for further doses of Lasix.  2. Hypernatremia. Patient does have hypernatremia with mild elevation of serum creatinine as well as elevation of serum BUN. This contradicts possible dehydration as hypernatremia cause. Currently patient has received IV Lasix and I will recheck her BMP every 4 hours as well as monitor serial neuro checks. We will treat  accordingly her sodium level as well as a serum creatinine. Holding narcotics due to possible history of confusion.  3. Sinus bradycardia. Patient's heart rate is in 40s with diffuse T-wave inversions on EKG. Monitor serial troponin I'll obtain an echo program in the morning. Holding Cardizem as well as Lopressor currently.  4. Vulvar cancer, chronic Foley, Change of Foley catheter since it has not been done in 5 weeks. Continue close monitoring.  5. Elevated d-dimer. Patient is already on therapeutic anticoagulation. Check VQ scan in morning.  6. A. fib. Currently sinus bradycardia. Holding Lopressor as well as Cardizem in view of the heart rate. Continue with warfarin per pharmacy.  7. Hypothyroidism. Continue Synthroid  Advance goals of care discussion: DNR DNI as per my discussion with husband as well as daughter in law  DVT Prophylaxis: on chronic anticoagulation Nutrition: cardiac and diabetic diet  Family Communication: husband and daughter in law was present at bedside, opportunity was given to ask question and all questions were answered satisfactorily at the time of interview. Disposition: Admitted to inpatient in step-down unit.  Author: Berle Mull, MD [facility] Pager: [phone] 11/24/2014, 9:59 PM    If 7PM-7AM, please contact night-coverage [email or web address] Password [email or web address]

## 2014-11-24 NOTE — Progress Notes (Signed)
ANTICOAGULATION CONSULT NOTE - Initial Consult  Pharmacy Consult for Coumadin Indication: atrial fibrillation  Allergies  Allergen Reactions  . Tenex [Guanfacine Hcl] Other (See Comments)    hypotension  . Crestor [Rosuvastatin] Other (See Comments)    Myalgia    Patient Measurements:    Vital Signs: BP: 145/54 mmHg (12/01 2100) Pulse Rate: 43 (12/01 2000)  Labs:  Recent Labs  11/24/14 1842 11/24/14 1845  HGB 10.7*  --   HCT 34.7*  --   PLT 212  --   LABPROT  --  32.3*  INR  --  3.11*  CREATININE 1.43*  --     Estimated Creatinine Clearance: 23.4 mL/min (by C-G formula based on Cr of 1.43).   Medical History: Past Medical History  Diagnosis Date  . Hypertension   . Hyperlipidemia   . Obesity   . Hypothyroidism   . Vulvar cancer, carcinoma 11/03/2011  . Atrial fibrillation   . Cancer 2007    vulva-invasive well diff squamous cell ca  . CVA (cerebral infarction)   . Dyslipidemia   . History of radiation therapy 10/20/2013-12/01/2013    60 gray to perineum area and lower pelvis  . Diabetes mellitus 05-04-14    "Diabetic coma" 1 week ago -tx. by EMT At residence until pt. feeling improved and stable.  . Shortness of breath     occ. with coughing up phelgm usually mornings  . 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 Abnormal voice 05-04-14    "raspy voice" with inspiratory effort-Dr. Lissa Hoard aware 05-04-14  . Stroke 05/2000    right brain CVA pre H&P 2001-  . Anemia   . Macular degeneration   . Postoperative wound infection 07/01/2014  . Community acquired pneumonia 08/09/2012  . Corneal thinning 11/08/2012  . GI bleed 05/16/2014  . PUD (peptic ulcer disease) 05/26/2014  . Type II or unspecified type diabetes mellitus with renal manifestations, uncontrolled(250.42) 08/10/2012  . CKD (chronic kidney disease), stage III     Medications:   (Not in a hospital admission)  Assessment: 80yo F w/ SOB. Suspected CHF exacerbation. VQ scan pending to  r/o PE. Pharmacy is asked to dose Coumadin for A.fib. Home dose 4mg  daily except 2mg  on Wednesdays. The last time she took a dose was the day before admission. INR is supratherapeutic on admission. CBC ok.   Goal of Therapy:  INR 2-3 Monitor platelets by anticoagulation protocol: Yes   Plan:  No Coumadin tonight. Check PT/INR daily.  Romeo Rabon, PharmD, pager [phone]. 11/24/2014,10:14 PM.

## 2014-11-25 DIAGNOSIS — E039 Hypothyroidism, unspecified: Secondary | ICD-10-CM

## 2014-11-25 LAB — BASIC METABOLIC PANEL
ANION GAP: 14 (ref 5–15)
ANION GAP: 15 (ref 5–15)
Anion gap: 14 (ref 5–15)
Anion gap: 18 — ABNORMAL HIGH (ref 5–15)
BUN: 39 mg/dL — ABNORMAL HIGH (ref 6–23)
BUN: 39 mg/dL — ABNORMAL HIGH (ref 6–23)
BUN: 37 mg/dL — ABNORMAL HIGH (ref 6–23)
BUN: 35 mg/dL — ABNORMAL HIGH (ref 6–23)
CALCIUM: 9.1 mg/dL (ref 8.4–10.5)
CALCIUM: 9.2 mg/dL (ref 8.4–10.5)
CO2: 24 mEq/L (ref 19–32)
CO2: 24 mEq/L (ref 19–32)
CO2: 25 mEq/L (ref 19–32)
CO2: 24 mEq/L (ref 19–32)
Calcium: 9.4 mg/dL (ref 8.4–10.5)
Calcium: 9 mg/dL (ref 8.4–10.5)
Chloride: 109 mEq/L (ref 96–112)
Chloride: 106 mEq/L (ref 96–112)
Chloride: 104 mEq/L (ref 96–112)
Chloride: 101 mEq/L (ref 96–112)
Creatinine, Ser: 1.49 mg/dL — ABNORMAL HIGH (ref 0.50–1.10)
Creatinine, Ser: 1.53 mg/dL — ABNORMAL HIGH (ref 0.50–1.10)
Creatinine, Ser: 1.54 mg/dL — ABNORMAL HIGH (ref 0.50–1.10)
Creatinine, Ser: 1.35 mg/dL — ABNORMAL HIGH (ref 0.50–1.10)
GFR calc Af Amer: 35 mL/min — ABNORMAL LOW (ref >90)
GFR calc Af Amer: 34 mL/min — ABNORMAL LOW (ref >90)
GFR calc Af Amer: 34 mL/min — ABNORMAL LOW (ref >90)
GFR calc Af Amer: 40 mL/min — ABNORMAL LOW (ref >90)
GFR calc non Af Amer: 34 mL/min — ABNORMAL LOW (ref >90)
GFR, EST NON AFRICAN AMERICAN: 31 mL/min — AB (ref >90)
GFR, EST NON AFRICAN AMERICAN: 30 mL/min — AB (ref >90)
GFR, EST NON AFRICAN AMERICAN: 29 mL/min — AB (ref >90)
Glucose, Bld: 69 mg/dL — ABNORMAL LOW (ref 70–99)
Glucose, Bld: 90 mg/dL (ref 70–99)
Glucose, Bld: 88 mg/dL (ref 70–99)
Glucose, Bld: 199 mg/dL — ABNORMAL HIGH (ref 70–99)
POTASSIUM: 4.7 meq/L (ref 3.7–5.3)
POTASSIUM: 4.4 meq/L (ref 3.7–5.3)
Potassium: 4.2 mEq/L (ref 3.7–5.3)
Potassium: 4.1 mEq/L (ref 3.7–5.3)
SODIUM: 144 meq/L (ref 137–147)
SODIUM: 144 meq/L (ref 137–147)
Sodium: 147 mEq/L (ref 137–147)
Sodium: 143 mEq/L (ref 137–147)

## 2014-11-25 LAB — GLUCOSE, CAPILLARY
Glucose-Capillary: 50 mg/dL — ABNORMAL LOW (ref 70–99)
Glucose-Capillary: 56 mg/dL — ABNORMAL LOW (ref 70–99)
Glucose-Capillary: 97 mg/dL (ref 70–99)
Glucose-Capillary: 221 mg/dL — ABNORMAL HIGH (ref 70–99)

## 2014-11-25 LAB — MRSA PCR SCREENING: MRSA by PCR: NEGATIVE

## 2014-11-25 LAB — PROTIME-INR
INR: 3.27 — ABNORMAL HIGH (ref 0.00–1.49)
Prothrombin Time: 33.6 seconds — ABNORMAL HIGH (ref 11.6–15.2)

## 2014-11-25 MED ORDER — DIPHENHYDRAMINE-ZINC ACETATE 2-0.1 % EX CREA
TOPICAL_CREAM | Freq: Three times a day (TID) | CUTANEOUS | Status: DC | PRN
  Administered 2014-11-26 – 2014-11-27 (×2): via TOPICAL
  Filled 2014-11-25: qty 28

## 2014-11-25 MED ORDER — CARVEDILOL 12.5 MG PO TABS
12.5000 mg | ORAL_TABLET | Freq: Two times a day (BID) | ORAL | Status: DC
  Administered 2014-11-25 – 2014-11-26 (×3): 12.5 mg via ORAL
  Filled 2014-11-25 (×4): qty 1

## 2014-11-25 MED ORDER — FUROSEMIDE 20 MG PO TABS
20.0000 mg | ORAL_TABLET | Freq: Every day | ORAL | Status: DC
  Administered 2014-11-25 – 2014-11-26 (×2): 20 mg via ORAL
  Filled 2014-11-25 (×2): qty 1

## 2014-11-25 NOTE — Progress Notes (Signed)
Nutrition Brief Note  Malnutrition Screening Tool result is inaccurate.  RD corrected screen. Please consult if nutrition needs are identified.  Rye Decoste F Pascual Mantel MS RD LDN Clinical Dietitian Pager:[phone]  

## 2014-11-25 NOTE — Evaluation (Signed)
Physical Therapy Evaluation Patient Details Name: [name] MRN: [id number] DOB: [birth year] Today's Date: 11/25/2014   History of Present Illness  80 y.o. female admitted for respiratory, distress, hypoxia. with a PMH of recurrent vulvar cancer status post multiple surgeries as well as radiation therapy,  CVA, DM, L eye vision loss.   Clinical Impression  Patient mobilized well to recliner. Patient will benefit from PT to address problems listed in note below.    Follow Up Recommendations Home health PT;Supervision - Intermittent    Equipment Recommendations  None recommended by PT    Recommendations for Other Services       Precautions / Restrictions Precautions Precautions: None;Fall Precaution Comments: colostomy, dressing in peri area      Mobility  Bed Mobility Overal bed mobility: Needs Assistance Bed Mobility: Supine to Sit     Supine to sit: Min assist;HOB elevated     General bed mobility comments: extra time  Transfers Overall transfer level: Needs assistance Equipment used: Rolling walker (2 wheeled) Transfers: Sit to/from [facility] Sit to Stand: Min assist         General transfer comment: cues for safety  Ambulation/Gait Ambulation/Gait assistance: Min assist Ambulation Distance (Feet): 5 Feet Assistive device: Rolling walker (2 wheeled) Gait Pattern/deviations: Step-to pattern;Step-through pattern     General Gait Details: cues for safety, turning and reaching back to recliner  Stairs            Wheelchair Mobility    Modified Rankin (Stroke Patients Only)       Balance Overall balance assessment: Needs assistance Sitting-balance support: Feet supported;Bilateral upper extremity supported Sitting balance-Leahy Scale: Fair     Standing balance support: During functional activity;Bilateral upper extremity supported Standing balance-Leahy Scale: Fair                               Pertinent  Vitals/Pain Pain Assessment: No/denies pain    Home Living Family/patient expects to be discharged to:: Private residence Living Arrangements: Spouse/significant other Available Help at Discharge: Family Type of Home: House Home Access: Stairs to enter Entrance Stairs-Rails: None Technical brewer of Steps: 2 Home Layout: One level Home Equipment: Environmental consultant - 2 wheels      Prior Function Level of [place]: Needs assistance   Gait / Transfers Assistance Needed: walks with RW           Hand Dominance        Extremity/Trunk Assessment   Upper Extremity Assessment: Overall WFL for tasks assessed           Lower Extremity Assessment: Overall WFL for tasks assessed         Communication   Communication:  (difficult to understand due to SOB, rasping voice.)  Cognition Arousal/Alertness: Awake/alert Behavior During Therapy: WFL for tasks assessed/performed Overall Cognitive Status: Within Functional Limits for tasks assessed                      General Comments      Exercises        Assessment/Plan    PT Assessment Patient needs continued PT services  PT Diagnosis Difficulty walking;Generalized weakness   PT Problem List Decreased strength;Decreased activity tolerance;Decreased mobility;Cardiopulmonary status limiting activity;Decreased knowledge of precautions;Decreased safety awareness;Decreased knowledge of use of DME  PT Treatment Interventions DME instruction;Gait training;Functional mobility training;Therapeutic activities;Therapeutic exercise;Patient/family education   PT Goals (Current goals can be found in the Care Plan  section) Acute Rehab PT Goals Patient Stated Goal: to go back home PT Goal Formulation: With patient Time For Goal Achievement: 12/09/14 Potential to Achieve Goals: Good    Frequency Min 3X/week   Barriers to discharge        Co-evaluation               End of Session Equipment Utilized During  Treatment: Oxygen Activity Tolerance: Patient tolerated treatment well Patient left: in chair;with call bell/phone within reach;with chair alarm set Nurse Communication: Mobility status         Time: 2481-8590 PT Time Calculation (min) (ACUTE ONLY): 22 min   Charges:   PT Evaluation $Initial PT Evaluation Tier I: 1 Procedure PT Treatments $Therapeutic Activity: 8-22 mins   PT G Codes:          Claretha Cooper 11/25/2014, 12:14 PM Tresa Endo PT [phone]

## 2014-11-25 NOTE — Progress Notes (Signed)
  CARE MANAGEMENT ED NOTE 11/25/2014  Patient:  [name], [name]   Account Number:  [id number]  Date Initiated:  11/25/2014  Documentation initiated by:  Livia Snellen  Subjective/Objective Assessment:   Patient presents to Ed with difficulty breathing     Subjective/Objective Assessment Detail:   Patient with pmhx of CVA, HTN, DM,Afib, cancer of the vulva which spread to rectum per family, patient with colostomy. Patient with chronic foley for CKD     Action/Plan:   Action/Plan Detail:   Anticipated DC Date:       Status Recommendation to Physician:   Result of Recommendation:    Other ED Services  Consult [facility] [place]  Other    Choice offered to / List presented to:           [place].    Status of service:  Completed, signed off  ED Comments:   ED Comments Detail:  EDCM spoke to patient and her family at bedside.  Patient lives with her husband [name] at home.  Patient is currently receiving home health services with [facility] for visiting RN, PT, OT and aide.  Patient noted to be wearing oxygen in the ED.  Patient does not wear oxygen at home. Patient has a chronic foley last changed on 10/26 and a colostomy bag.  EDCM placed sticky note to physician regarding need for foley catheter change.  Patient's family reports patient was here three weeks ago.  Patient has a walker and a bedside commode at home.  Patient's family reports [facility] comes to the house to draw coumadin level as well.   Patient was admitted from 10/24 to 10/29 for UTI, nausea/vomitting.  Patient has seen Dr. Daisey Must, Dr. Dwyane Dee and Dr. Orvan Seen since her last admission.  Patient's family confirms her pcp is Dr. Dwyane Dee.  No further EDCM needs at this time.

## 2014-11-25 NOTE — Progress Notes (Signed)
ANTICOAGULATION CONSULT NOTE - Follow Up  Pharmacy Consult for Coumadin Indication: atrial fibrillation  Allergies  Allergen Reactions  . Tenex [Guanfacine Hcl] Other (See Comments)    hypotension  . Crestor [Rosuvastatin] Other (See Comments)    Myalgia    Patient Measurements: Height: 5\' 4"  (162.6 cm) Weight: 132 lb 7.9 oz (60.1 kg) IBW/kg (Calculated) : 54.7  Vital Signs: Temp: 98.1 F (36.7 C) (12/02 0400) Temp Source: Oral (12/02 0400) BP: 132/54 mmHg (12/02 0600) Pulse Rate: 52 (12/02 0700)  Labs:  Recent Labs  11/24/14 1842 11/24/14 1845 11/24/14 2237 11/25/14 0150 11/25/14 0515  HGB 10.7*  --   --   --   --   HCT 34.7*  --   --   --   --   PLT 212  --   --   --   --   LABPROT  --  32.3*  --  33.6*  --   INR  --  3.11*  --  3.27*  --   CREATININE 1.43*  --  1.47* 1.49* 1.53*    Estimated Creatinine Clearance: 22.8 mL/min (by C-G formula based on Cr of 1.53).    Assessment: 88yoF with hx of diastolic heart failure, PAF (on chronic warfarin), CVA, HTN, HLD, CKD (stage 3) and metastatic vulvar cancer; presented to ED with SOB and admitted with acute on chronic diastolic heart failure.  Pharmacy consulted to manage warfarin inpatient.  INR supratherapeutic on admission.   Home dose 4mg  daily except 2mg  on Wednesdays.  Last dose 11/30.  INR supratherapeutic at 3.27.  CBC ok.   Goal of Therapy:  INR 2-3 Monitor platelets by anticoagulation protocol: Yes   Plan:  1.  No warfarin today. 2.  Follow up INR in AM.  Daily INR.  Hershal Coria, PharmD, BCPS Pager: [phone] 11/25/2014 10:29 AM

## 2014-11-25 NOTE — Progress Notes (Signed)
CARE MANAGEMENT NOTE 11/25/2014  Patient:  [name], [name]   Account Number:  [id number]  Date Initiated:  11/25/2014  Documentation initiated by:  Leilyn Frayre  Subjective/Objective Assessment:   pt with hx of metas. ca resp failure and o2 of 87% with confusion     Action/Plan:   tbd=dnr   Anticipated DC Date:  11/28/2014   Anticipated DC Plan:  HOME/SELF CARE  In-house referral  NA      DC Planning Services  CM consult      PAC Choice  NA   Choice offered to / List presented to:  NA   DME arranged  NA      DME agency  NA     [place] arranged  NA      [place] agency  NA   Status of service:  In process, will continue to follow Medicare Important Message given?   (If response is "NO", the following Medicare IM given date fields will be blank) Date Medicare IM given:   Medicare IM given by:   Date Additional Medicare IM given:   Additional Medicare IM given by:    Discharge Disposition:    Per UR Regulation:  Reviewed for med. necessity/level of care/duration of stay  If discussed at [place] of Stay Meetings, dates discussed:    Comments:  [id number]/Paxson Harrower,RN,BSN,CCM

## 2014-11-25 NOTE — Progress Notes (Signed)
[facility] PROGRESS NOTE Interim History: 80 y/o female with hx of diastolic heart failure, PAF, CVA (with residual speech impairment), HTN, HLD, CKD (stage3) and metastatic vulvar cancer; presented to ED with SOB and elevated BNP. Found to have acute on chronic diastolic heart failure.   Filed Weights   11/24/14 2300 11/25/14 0500  Weight: 60.1 kg (132 lb 7.9 oz) 60.1 kg (132 lb 7.9 oz)        Intake/Output Summary (Last 24 hours) at 11/25/14 0857 Last data filed at 11/25/14 0500  Gross per 24 hour  Intake    423 ml  Output   1025 ml  Net   -602 ml     Assessment/Plan: 1-Acute on chronic diastolic CHF (congestive heart failure): due to recent discontinuation of lasix and not strict low sodium diet compliance. -will resume lasix at 20mg  daily -will follow low sodium diet and daily weights -continue checking strict I's and O's -close follow up to her renal function  2-Hypernatremia: resolved and with sodium of 144 today. -will monitor  3-Sinus bradycardia: stable. -will continue holding cardizem and lopressor -for better control of BP and to prevent rebound from B-blocker will start coreg BID  4-Protein-calorie malnutrition, severe: will continue feeding supplements  5-Type 2 diabetes mellitus with renal manifestations: continue SSI  6-Aortic stenosis: stable. Patient to continue follow up with cardiology as an outpatient  7-Chronic anticoagulation: continue coumadin per pharmacy  8-Chronic kidney disease, stage III (moderate): Cr slightly elevated with diuresis but GFR essentially the same -will follow renal function fluctuation  9-Essential hypertension: slightly elevated. Will use coreg for better control, without affecting to much HR.  10-Paroxysmal Atrial fibrillation: with hx of stroke in the past. -will continue b-blocker (now on coreg) -patient is currently sinus bradycardia -continue chronic anticoagulation  11-Hypothyroidism: continue  synthroid  12-Vulvar cancer, carcinoma: follow up by PCP and oncology as an outpatient. Problem is apparently stable.  13-hx of CVA with residual speech impairment: continue coumadin -aspiration precautions.  14-HLD: continue lipitor    Code Status: DNR Family Communication: no family at bedside Disposition Plan: will transfer out of stepdown   Consultants:  None   Procedures: ECHO: most recent one from May, 2015 EF 55-60% and with signs of elevated end diastolic pressure on her left ventricle (equivalent to mild diastolic HF)  Antibiotics:  None   HPI/Subjective: Feeling better and breathing a lot easier. No fever, no CP.  Objective: Filed Vitals:   11/25/14 0400 11/25/14 0500 11/25/14 0600 11/25/14 0700  BP: 115/56 115/71 132/54   Pulse: 50 50 54 52  Temp: 98.1 F (36.7 C)     TempSrc: Oral     Resp: 15 14 17 19   Height:      Weight:  60.1 kg (132 lb 7.9 oz)    SpO2: 98% 97% 97% 97%     Exam:  General: Alert, awake, in no acute distress and breathing a lot easier.  HEENT: No bruits, no goiter Heart: sinus bradycardia, no rubs or gallops; no Le edema Lungs: improve air movement, no frank crackles on exam; no wheezing Abdomen: Soft, nontender, nondistended, positive bowel sounds.  Neuro: no new focal deficit.   Data Reviewed: Basic Metabolic Panel:  Recent Labs Lab 11/24/14 1842 11/24/14 2237 11/25/14 0150 11/25/14 0515  NA 148* 146 147 144  K 4.5 4.1 4.7 4.4  CL 111 108 109 106  CO2 20 25 24 24   GLUCOSE 76 57* 69* 90  BUN 40* 40* 39* 39*  CREATININE 1.43* 1.47* 1.49* 1.53*  CALCIUM 9.5 9.5 9.1 9.2   Liver Function Tests:  Recent Labs Lab 11/24/14 1842  AST 64*  ALT 45*  ALKPHOS 95  BILITOT 0.9  PROT 7.2  ALBUMIN 3.3*   CBC:  Recent Labs Lab 11/24/14 1842  WBC 8.0  NEUTROABS 6.4  HGB 10.7*  HCT 34.7*  MCV 103.3*  PLT 212   BNP (last 3 results)  Recent Labs  11/24/14 1845  PROBNP 9938.0*   CBG:  Recent Labs Lab  11/24/14 2255 11/24/14 2322 11/25/14 0438 11/25/14 0529  GLUCAP 50* 78 56* 97    Recent Results (from the past 240 hour(s))  MRSA PCR Screening     Status: None   Collection Time: 11/25/14 12:41 AM  Result Value Ref Range Status   MRSA by PCR NEGATIVE NEGATIVE Final    Comment:        The GeneXpert MRSA Assay (FDA approved for NASAL specimens only), is one component of a comprehensive MRSA colonization surveillance program. It is not intended to diagnose MRSA infection nor to guide or monitor treatment for MRSA infections.      Studies: Dg Chest Port 1 View  11/24/2014   CLINICAL DATA:  80 year old female with extreme shortness of breath progressive over the past 3 days  EXAM: PORTABLE CHEST - 1 VIEW  COMPARISON:  Prior chest x-ray and abdominal series 10 04/13/2014  FINDINGS: Stable cardiomegaly. Increased pulmonary vascular congestion now with diffuse mild interstitial edema. Atherosclerotic calcification present within the transverse aorta. No focal airspace consolidation, pneumothorax or large effusion. No acute osseous abnormality.  IMPRESSION: Radiographic findings are most suggestive of mild -moderate CHF.   Electronically Signed   By: Jacqulynn Cadet M.D.   On: 11/24/2014 18:34    Scheduled Meds: . antiseptic oral rinse  7 mL Mouth Rinse BID  . atorvastatin  20 mg Oral Daily  . calcitRIOL  0.25 mcg Oral Q M,W,F  . carvedilol  12.5 mg Oral BID WC  . docusate sodium  50 mg Oral BID  . feeding supplement (GLUCERNA SHAKE)  237 mL Oral TID BM  . furosemide  20 mg Oral Daily  . insulin aspart  0-15 Units Subcutaneous Q6H  . levothyroxine  100 mcg Oral Once per day on Mon Tue Wed Thu Fri Sat  . nystatin cream  1 application Topical BID  . pantoprazole  40 mg Oral BID  . sodium chloride  3 mL Intravenous Q12H  . Warfarin - Pharmacist Dosing Inpatient   Does not apply q1800   Continuous Infusions:   Time: 30 minutes  Barton Dubois  [facility] Pager  [phone]. If 8PM-8AM, please contact night-coverage at [email or web address], password [facility] 11/25/2014, 8:57 AM  LOS: 1 day

## 2014-11-26 DIAGNOSIS — R7881 Bacteremia: Secondary | ICD-10-CM

## 2014-11-26 LAB — BASIC METABOLIC PANEL
ANION GAP: 14 (ref 5–15)
BUN: 29 mg/dL — AB (ref 6–23)
CO2: 25 mEq/L (ref 19–32)
Calcium: 8.9 mg/dL (ref 8.4–10.5)
Chloride: 104 mEq/L (ref 96–112)
Creatinine, Ser: 1.29 mg/dL — ABNORMAL HIGH (ref 0.50–1.10)
GFR, EST AFRICAN AMERICAN: 42 mL/min — AB (ref >90)
GFR, EST NON AFRICAN AMERICAN: 36 mL/min — AB (ref >90)
Glucose, Bld: 90 mg/dL (ref 70–99)
POTASSIUM: 3.6 meq/L — AB (ref 3.7–5.3)
SODIUM: 143 meq/L (ref 137–147)

## 2014-11-26 LAB — GLUCOSE, CAPILLARY
GLUCOSE-CAPILLARY: 168 mg/dL — AB (ref 70–99)
GLUCOSE-CAPILLARY: 231 mg/dL — AB (ref 70–99)
Glucose-Capillary: 173 mg/dL — ABNORMAL HIGH (ref 70–99)
Glucose-Capillary: 174 mg/dL — ABNORMAL HIGH (ref 70–99)
Glucose-Capillary: 89 mg/dL (ref 70–99)
Glucose-Capillary: 167 mg/dL — ABNORMAL HIGH (ref 70–99)

## 2014-11-26 LAB — PROTIME-INR
INR: 2.65 — ABNORMAL HIGH (ref 0.00–1.49)
PROTHROMBIN TIME: 28.5 s — AB (ref 11.6–15.2)

## 2014-11-26 MED ORDER — CARVEDILOL 25 MG PO TABS
25.0000 mg | ORAL_TABLET | Freq: Two times a day (BID) | ORAL | Status: DC
  Administered 2014-11-27: 25 mg via ORAL

## 2014-11-26 MED ORDER — WARFARIN SODIUM 2 MG PO TABS
2.0000 mg | ORAL_TABLET | Freq: Once | ORAL | Status: AC
  Administered 2014-11-26: 2 mg via ORAL
  Filled 2014-11-26: qty 1

## 2014-11-26 MED ORDER — FUROSEMIDE 20 MG PO TABS
30.0000 mg | ORAL_TABLET | Freq: Every day | ORAL | Status: DC
  Administered 2014-11-27: 30 mg via ORAL

## 2014-11-26 MED ORDER — POTASSIUM CHLORIDE CRYS ER 20 MEQ PO TBCR
40.0000 meq | EXTENDED_RELEASE_TABLET | Freq: Every day | ORAL | Status: DC
  Administered 2014-11-27: 40 meq via ORAL
  Filled 2014-11-26: qty 2

## 2014-11-26 MED ORDER — VANCOMYCIN HCL 500 MG IV SOLR
500.0000 mg | INTRAVENOUS | Status: DC
  Administered 2014-11-26 – 2014-11-27 (×2): 500 mg via INTRAVENOUS
  Filled 2014-11-26 (×2): qty 500

## 2014-11-26 NOTE — Progress Notes (Signed)
PT Cancellation Note  Patient Details Name: [name] MRN: [id number] DOB: 04-09-1926   Cancelled Treatment:    Reason Eval/Treat Not Completed: Medical issues which prohibited therapy (patient  very restless in bed, c/oback itching terribly, looks very miserable. pt. declined to getup into recliner at this time.)   Claretha Cooper 11/26/2014, 11:17 AM Tresa Endo PT [phone]

## 2014-11-26 NOTE — Progress Notes (Signed)
[facility] PROGRESS NOTE Interim History: 80 y/o female with hx of diastolic heart failure, PAF, CVA (with residual speech impairment), HTN, HLD, CKD (stage3) and metastatic vulvar cancer; presented to ED with SOB and elevated BNP. Found to have acute on chronic diastolic heart failure.   Filed Weights   11/25/14 0500 11/25/14 2230 11/26/14 0509  Weight: 60.1 kg (132 lb 7.9 oz) 59.5 kg (131 lb 2.8 oz) 59.285 kg (130 lb 11.2 oz)        Intake/Output Summary (Last 24 hours) at 11/26/14 2137 Last data filed at 11/26/14 2052  Gross per 24 hour  Intake    480 ml  Output   1875 ml  Net  -1395 ml     Assessment/Plan: 1-Acute on chronic diastolic CHF (congestive heart failure): due to recent discontinuation of lasix and not strict low sodium diet compliance. -will adjust dose of lasix to 30mg  daily; still slightly SOB and requiring O2 supplementation -will follow low sodium diet and daily weights -continue checking strict I's and O's -close follow up to her renal function (Cr so far back to baseline and at her best level 1.29)  2-Hypernatremia: resolved and with sodium of 143 on 12/3. -will monitor  3-Sinus bradycardia: stable and with HR in the 60-70's now. -will continue holding cardizem and lopressor -for better control of BP and to prevent rebound from B-blocker will continue coreg BID  4-Protein-calorie malnutrition, severe: will continue feeding supplements  5-Type 2 diabetes mellitus with renal manifestations: continue SSI -no episodes of hypoglycemia  6-Aortic stenosis: stable. Patient to continue follow up with cardiology as an outpatient  7-Chronic anticoagulation: continue coumadin per pharmacy  8-Chronic kidney disease, stage III (moderate): Cr improved and at patient's best level  -will follow renal function fluctuation -BMET in am  9-Essential hypertension: slightly elevated.  -will continue coreg, but will adjust to 25mg  BID -lasix also adjusted to  30mg  daily  10-Paroxysmal Atrial fibrillation: with hx of stroke in the past. -will continue b-blocker (now on coreg) -patient with controlled HR -continue chronic anticoagulation  11-Hypothyroidism: continue synthroid  12-Vulvar cancer, carcinoma: follow up by PCP and oncology as an outpatient. Problem is apparently stable.  13-hx of CVA with residual speech impairment: continue coumadin -aspiration precautions. -no new deficit  14-HLD: continue lipitor   15-itching: continue PRN benadryl cream  16-positive 1/2 blood cx's: appears to be staph species -no signs of infection or toxic appearance; most likely contaminant -while waiting speciation will cover with vancomycin   Code Status: DNR Family Communication: no family at bedside Disposition Plan: will transfer out of stepdown   Consultants:  None   Procedures: ECHO: most recent one from May, 2015 EF 55-60% and with signs of elevated end diastolic pressure on her left ventricle (equivalent to mild diastolic HF)  Antibiotics:  None   HPI/Subjective: No fever, no CP. Breathing continue improving. But still requiring O2 supplementation. Positive blood cx's for gram + cocci in clusters 1/2  Objective: Filed Vitals:   11/25/14 2230 11/26/14 0509 11/26/14 1454 11/26/14 2049  BP: 126/46 139/46 136/68 170/50  Pulse: 60 53 67 67  Temp: 97.5 F (36.4 C) 97.7 F (36.5 C) 97.5 F (36.4 C) 97.7 F (36.5 C)  TempSrc: Oral Oral Oral Oral  Resp: 20 20 19 20   Height: 5\' 4"  (1.626 m)     Weight: 59.5 kg (131 lb 2.8 oz) 59.285 kg (130 lb 11.2 oz)    SpO2: 96% 93% 100% 100%     Exam:  General: Alert, awake, in no acute distress and breathing a lot easier. Complaining of itching. Still requiring O2 supplementation.  HEENT: No bruits, no goiter Heart: sinus bradycardia, no rubs or gallops; no Le edema Lungs: improve air movement, no frank crackles on exam; no wheezing; scattered rhonchi Abdomen: Soft, nontender,  nondistended, positive bowel sounds.  Neuro: no new focal deficit.   Data Reviewed: Basic Metabolic Panel:  Recent Labs Lab 11/25/14 0150 11/25/14 0515 11/25/14 0925 11/25/14 1428 11/26/14 0420  NA 147 144 144 143 143  K 4.7 4.4 4.2 4.1 3.6*  CL 109 106 104 101 104  CO2 24 24 25 24 25   GLUCOSE 69* 90 88 199* 90  BUN 39* 39* 37* 35* 29*  CREATININE 1.49* 1.53* 1.54* 1.35* 1.29*  CALCIUM 9.1 9.2 9.4 9.0 8.9   Liver Function Tests:  Recent Labs Lab 11/24/14 1842  AST 64*  ALT 45*  ALKPHOS 95  BILITOT 0.9  PROT 7.2  ALBUMIN 3.3*   CBC:  Recent Labs Lab 11/24/14 1842  WBC 8.0  NEUTROABS 6.4  HGB 10.7*  HCT 34.7*  MCV 103.3*  PLT 212   BNP (last 3 results)  Recent Labs  11/24/14 1845  PROBNP 9938.0*   CBG:  Recent Labs Lab 11/25/14 1707 11/26/14 0112 11/26/14 0549 11/26/14 1159 11/26/14 1850  GLUCAP 173* 174* 89 231* 167*    Recent Results (from the past 240 hour(s))  Culture, blood (routine x 2)     Status: None (Preliminary result)   Collection Time: 11/24/14  6:42 PM  Result Value Ref Range Status   Specimen Description BLOOD LEFT ANTECUBITAL  Final   Special Requests BOTTLES DRAWN AEROBIC AND ANAEROBIC 4ML  Final   Culture  Setup Time   Final    11/25/2014 01:06 Performed at [facility]    Culture   Final           BLOOD CULTURE RECEIVED NO GROWTH TO DATE CULTURE WILL BE HELD FOR 5 DAYS BEFORE ISSUING A FINAL NEGATIVE REPORT Performed at [facility]    Report Status PENDING  Incomplete  Culture, blood (routine x 2)     Status: None (Preliminary result)   Collection Time: 11/24/14  6:45 PM  Result Value Ref Range Status   Specimen Description BLOOD RIGHT FOREARM  Final   Special Requests BOTTLES DRAWN AEROBIC ONLY 3ML  Final   Culture  Setup Time   Final    11/25/2014 01:07 Performed at [facility]    Culture   Final    GRAM POSITIVE COCCI IN CLUSTERS Note: Gram Stain Report Called to,Read Back By and  Verified With: ENE ALI 11/26/14 0950 BY SMITHERSJ Performed at [facility]    Report Status PENDING  Incomplete  Urine culture     Status: None (Preliminary result)   Collection Time: 11/24/14  9:22 PM  Result Value Ref Range Status   Specimen Description URINE, CATHETERIZED BAG PED  Final   Special Requests NONE  Final   Culture  Setup Time   Final    11/25/2014 03:03 Performed at [place]   Final    >=100,000 COLONIES/ML Performed at [facility]    Culture   Final    PROTEUS MIRABILIS Performed at [facility]    Report Status PENDING  Incomplete  MRSA PCR Screening     Status: None   Collection Time: 11/25/14 12:41 AM  Result Value Ref  Range Status   MRSA by PCR NEGATIVE NEGATIVE Final    Comment:        The GeneXpert MRSA Assay (FDA approved for NASAL specimens only), is one component of a comprehensive MRSA colonization surveillance program. It is not intended to diagnose MRSA infection nor to guide or monitor treatment for MRSA infections.      Studies: No results found.  Scheduled Meds: . antiseptic oral rinse  7 mL Mouth Rinse BID  . atorvastatin  20 mg Oral Daily  . calcitRIOL  0.25 mcg Oral Q M,W,F  . [START ON 11/27/2014] carvedilol  25 mg Oral BID WC  . docusate sodium  50 mg Oral BID  . feeding supplement (GLUCERNA SHAKE)  237 mL Oral TID BM  . [START ON 11/27/2014] furosemide  30 mg Oral Daily  . insulin aspart  0-15 Units Subcutaneous Q6H  . levothyroxine  100 mcg Oral Once per day on Mon Tue Wed Thu Fri Sat  . nystatin cream  1 application Topical BID  . pantoprazole  40 mg Oral BID  . potassium chloride  40 mEq Oral Daily  . sodium chloride  3 mL Intravenous Q12H  . vancomycin  500 mg Intravenous Q24H  . Warfarin - Pharmacist Dosing Inpatient   Does not apply q1800   Continuous Infusions:   Time: 30 minutes  Barton Dubois  [facility] Pager [phone]. If 8PM-8AM, please contact  night-coverage at [email or web address], password [facility] 11/26/2014, 9:37 PM  LOS: 2 days

## 2014-11-26 NOTE — Progress Notes (Signed)
ANTICOAGULATION CONSULT NOTE - Follow Up  Pharmacy Consult for Coumadin Indication: atrial fibrillation  Allergies  Allergen Reactions  . Tenex [Guanfacine Hcl] Other (See Comments)    hypotension  . Crestor [Rosuvastatin] Other (See Comments)    Myalgia    Patient Measurements: Height: 5\' 4"  (162.6 cm) Weight: 130 lb 11.2 oz (59.285 kg) IBW/kg (Calculated) : 54.7  Vital Signs: Temp: 97.7 F (36.5 C) (12/03 0509) Temp Source: Oral (12/03 0509) BP: 139/46 mmHg (12/03 0509) Pulse Rate: 53 (12/03 0509)  Labs:  Recent Labs  11/24/14 1842 11/24/14 1845  11/25/14 0150  11/25/14 0925 11/25/14 1428 11/26/14 0420  HGB 10.7*  --   --   --   --   --   --   --   HCT 34.7*  --   --   --   --   --   --   --   PLT 212  --   --   --   --   --   --   --   LABPROT  --  32.3*  --  33.6*  --   --   --  28.5*  INR  --  3.11*  --  3.27*  --   --   --  2.65*  CREATININE 1.43*  --   < > 1.49*  < > 1.54* 1.35* 1.29*  < > = values in this interval not displayed.  Estimated Creatinine Clearance: 27 mL/min (by C-G formula based on Cr of 1.29).    Assessment: 88yoF with hx of diastolic heart failure, PAF (on chronic warfarin), CVA, HTN, HLD, CKD (stage 3) and metastatic vulvar cancer; presented to ED with SOB and admitted with acute on chronic diastolic heart failure.  Pharmacy consulted to manage warfarin inpatient.  INR supratherapeutic on admission.   Home dose 4mg  daily except 2mg  on Wednesdays.  Last dose 11/30.  Warfarin held 12/1-12/2 due to supratherapeutic INR.  INR now therapeutic at 2.65.  CBC ok.   Diet: Carb modified + Glucerna feeding supplements  Goal of Therapy:  INR 2-3 Monitor platelets by anticoagulation protocol: Yes   Plan:   Warfarin 2 mg PO x 1 tonight at 1800.  Daily PT/INR  Monitor for signs and symptoms of bleeding.   Lindell Spar, PharmD, BCPS Pager: [phone] 11/26/2014 11:27 AM

## 2014-11-26 NOTE — Progress Notes (Signed)
ANTIBIOTIC CONSULT NOTE - INITIAL  Pharmacy Consult for Vancomycin Indication: Bacteremia   Allergies  Allergen Reactions  . Tenex [Guanfacine Hcl] Other (See Comments)    hypotension  . Crestor [Rosuvastatin] Other (See Comments)    Myalgia    Patient Measurements: Height: 5\' 4"  (162.6 cm) Weight: 130 lb 11.2 oz (59.285 kg) IBW/kg (Calculated) : 54.7  Vital Signs: Temp: 97.7 F (36.5 C) (12/03 0509) Temp Source: Oral (12/03 0509) BP: 139/46 mmHg (12/03 0509) Pulse Rate: 53 (12/03 0509) Intake/Output from previous day: 12/02 0701 - 12/03 0700 In: 474 [P.O.:474] Out: 1625 [Urine:1350; Stool:275] Intake/Output from this shift:    Labs:  Recent Labs  11/24/14 1842  11/25/14 0925 11/25/14 1428 11/26/14 0420  WBC 8.0  --   --   --   --   HGB 10.7*  --   --   --   --   PLT 212  --   --   --   --   CREATININE 1.43*  < > 1.54* 1.35* 1.29*  < > = values in this interval not displayed. Estimated Creatinine Clearance: 27 mL/min (by C-G formula based on Cr of 1.29). No results for input(s): VANCOTROUGH, VANCOPEAK, VANCORANDOM, GENTTROUGH, GENTPEAK, GENTRANDOM, TOBRATROUGH, TOBRAPEAK, TOBRARND, AMIKACINPEAK, AMIKACINTROU, AMIKACIN in the last 72 hours.   Microbiology: Recent Results (from the past 720 hour(s))  Culture, blood (routine x 2)     Status: None (Preliminary result)   Collection Time: 11/24/14  6:42 PM  Result Value Ref Range Status   Specimen Description BLOOD LEFT ANTECUBITAL  Final   Special Requests BOTTLES DRAWN AEROBIC AND ANAEROBIC 4ML  Final   Culture  Setup Time   Final    11/25/2014 01:06 Performed at [facility]    Culture   Final           BLOOD CULTURE RECEIVED NO GROWTH TO DATE CULTURE WILL BE HELD FOR 5 DAYS BEFORE ISSUING A FINAL NEGATIVE REPORT Performed at [facility]    Report Status PENDING  Incomplete  Culture, blood (routine x 2)     Status: None (Preliminary result)   Collection Time: 11/24/14  6:45 PM  Result  Value Ref Range Status   Specimen Description BLOOD RIGHT FOREARM  Final   Special Requests BOTTLES DRAWN AEROBIC ONLY 3ML  Final   Culture  Setup Time   Final    11/25/2014 01:07 Performed at [facility]    Culture   Final    [place] IN CLUSTERS Note: Gram Stain Report Called to,Read Back By and Verified With: ENE ALI 11/26/14 0950 BY SMITHERSJ Performed at [facility]    Report Status PENDING  Incomplete  Urine culture     Status: None (Preliminary result)   Collection Time: 11/24/14  9:22 PM  Result Value Ref Range Status   Specimen Description URINE, CATHETERIZED BAG PED  Final   Special Requests NONE  Final   Culture  Setup Time   Final    11/25/2014 03:03 Performed at [place] PENDING  Incomplete   Culture   Final    Culture reincubated for better growth Performed at [facility]    Report Status PENDING  Incomplete  MRSA PCR Screening     Status: None   Collection Time: 11/25/14 12:41 AM  Result Value Ref Range Status   MRSA by PCR NEGATIVE NEGATIVE Final    Comment:  The GeneXpert MRSA Assay (FDA approved for NASAL specimens only), is one component of a comprehensive MRSA colonization surveillance program. It is not intended to diagnose MRSA infection nor to guide or monitor treatment for MRSA infections.     Medical History: Past Medical History  Diagnosis Date  . Hypertension   . Hyperlipidemia   . Obesity   . Hypothyroidism   . Vulvar cancer, carcinoma 11/03/2011  . Atrial fibrillation   . Cancer 2007    vulva-invasive well diff squamous cell ca  . CVA (cerebral infarction)   . Dyslipidemia   . History of radiation therapy 10/20/2013-12/01/2013    60 gray to perineum area and lower pelvis  . Diabetes mellitus 05-04-14    "Diabetic coma" 1 week ago -tx. by EMT [facility] residence until pt. feeling improved and stable.  . Shortness of breath     occ. with coughing up phelgm usually  mornings  . 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 Abnormal voice 05-04-14    "raspy voice" with inspiratory effort-Dr. Lissa Hoard aware 05-04-14  . Stroke 05/2000    right brain CVA pre H&P 2001-  . Anemia   . Macular degeneration   . Postoperative wound infection 07/01/2014  . Community acquired pneumonia 08/09/2012  . Corneal thinning 11/08/2012  . GI bleed 05/16/2014  . PUD (peptic ulcer disease) 05/26/2014  . Type II or unspecified type diabetes mellitus with renal manifestations, uncontrolled(250.42) 08/10/2012  . CKD (chronic kidney disease), stage III    Assessment: 88yoF with hx of diastolic heart failure, PAF (on chronic warfarin), CVA, HTN, HLD, CKD (stage 3) and metastatic vulvar cancer; presented to ED with SOB and admitted with acute on chronic diastolic heart failure. One of two blood cultures now positive for gram positive cocci in clusters, and pharmacy asked to assist with Vancomycin dosing.  12/3 >> Vancomycin >>  Tmax: afebrile WBCs: WNL (on 12/1) Renal: SCr 1.29, CrCl ~ 27 mL/min CG  12/1 blood: 1/2 with GPC in clusters 12/1 urine: pending  12/2 MRSA PCR: negative  Goal of Therapy:  Vancomycin trough level 15-20 mcg/ml  Appropriate antibiotic dosing for renal function Eradication of infection  Plan:   Vancomycin 500 mg IV q24h  Plan to check Vancomycin trough level at steady state.  Follow renal function, cultures, clinical course.   Lindell Spar, PharmD, BCPS Pager: [phone] 11/26/2014 11:40 AM

## 2014-11-26 NOTE — Progress Notes (Signed)
Called report to [facility] to transfer patient around 2200 on 11/25/14.  Vitals stable at transfer.

## 2014-11-27 DIAGNOSIS — R791 Abnormal coagulation profile: Secondary | ICD-10-CM

## 2014-11-27 DIAGNOSIS — R7989 Other specified abnormal findings of blood chemistry: Secondary | ICD-10-CM | POA: Insufficient documentation

## 2014-11-27 DIAGNOSIS — E43 Unspecified severe protein-calorie malnutrition: Secondary | ICD-10-CM

## 2014-11-27 DIAGNOSIS — I482 Chronic atrial fibrillation: Secondary | ICD-10-CM

## 2014-11-27 LAB — URINE CULTURE

## 2014-11-27 LAB — BASIC METABOLIC PANEL
Anion gap: 11 (ref 5–15)
BUN: 23 mg/dL (ref 6–23)
CALCIUM: 8.8 mg/dL (ref 8.4–10.5)
CO2: 29 mEq/L (ref 19–32)
CREATININE: 1.34 mg/dL — AB (ref 0.50–1.10)
Chloride: 101 mEq/L (ref 96–112)
GFR calc Af Amer: 40 mL/min — ABNORMAL LOW (ref >90)
GFR, EST NON AFRICAN AMERICAN: 35 mL/min — AB (ref >90)
GLUCOSE: 132 mg/dL — AB (ref 70–99)
Potassium: 3.4 mEq/L — ABNORMAL LOW (ref 3.7–5.3)
Sodium: 141 mEq/L (ref 137–147)

## 2014-11-27 LAB — GLUCOSE, CAPILLARY
GLUCOSE-CAPILLARY: 137 mg/dL — AB (ref 70–99)
GLUCOSE-CAPILLARY: 224 mg/dL — AB (ref 70–99)

## 2014-11-27 LAB — CULTURE, BLOOD (ROUTINE X 2)

## 2014-11-27 LAB — PROTIME-INR
INR: 2.12 — ABNORMAL HIGH (ref 0.00–1.49)
PROTHROMBIN TIME: 24 s — AB (ref 11.6–15.2)

## 2014-11-27 LAB — PRO B NATRIURETIC PEPTIDE: Pro B Natriuretic peptide (BNP): 6822 pg/mL — ABNORMAL HIGH (ref 0–450)

## 2014-11-27 MED ORDER — FUROSEMIDE 20 MG PO TABS: 30.0000 mg | ORAL_TABLET | Freq: Every day | ORAL | Status: DC

## 2014-11-27 MED ORDER — WARFARIN SODIUM 4 MG PO TABS
4.0000 mg | ORAL_TABLET | Freq: Once | ORAL | Status: DC
  Filled 2014-11-27: qty 1

## 2014-11-27 MED ORDER — CARVEDILOL 25 MG PO TABS: 25.0000 mg | ORAL_TABLET | Freq: Two times a day (BID) | ORAL | Status: DC

## 2014-11-27 MED ORDER — POTASSIUM CHLORIDE CRYS ER 20 MEQ PO TBCR
40.0000 meq | EXTENDED_RELEASE_TABLET | Freq: Every day | ORAL | Status: DC
Start: 2014-11-27 — End: 2014-12-04

## 2014-11-27 NOTE — Discharge Summary (Signed)
Physician Discharge Summary  [name] ERD:[id number] DOB: 1926-01-03 DOA: 11/24/2014  PCP: Elayne Snare, MD  Admit date: 11/24/2014 Discharge date: 11/27/2014  Time spent: >30 minutes  Recommendations for Outpatient Follow-up:  Reassess BP and adjust medications as needed Check BMET to follow electrolytes and renal function Follow up of HR and volume status during visit with cardiology; adjust medications as needed  Discharge Diagnoses:  Principal Problem:   Acute on chronic diastolic CHF (congestive heart failure) Active Problems:   Vulvar cancer, carcinoma   Hypothyroidism   Atrial fibrillation   Essential hypertension   Chronic kidney disease, stage III (moderate)   Chronic anticoagulation   Aortic stenosis   Type 2 diabetes mellitus with renal manifestations   Protein-calorie malnutrition, severe   Hypernatremia   Sinus bradycardia   Discharge Condition: stable and improved. Discharge home with resumption of [place] services and husband care. Follow up with cardiology in 10 days and with PCP in 2 weeks  Diet recommendation: low sodium and low carbohydrates diet  Filed Weights   11/25/14 2230 11/26/14 0509 11/27/14 0500  Weight: 59.5 kg (131 lb 2.8 oz) 59.285 kg (130 lb 11.2 oz) 60.1 kg (132 lb 7.9 oz)    History of present illness:  81 y/o female with hx of diastolic heart failure, PAF, CVA (with residual speech impairment), HTN, HLD, CKD (stage3) and metastatic vulvar cancer; presented to ED with SOB and elevated BNP. Found to have acute on chronic diastolic heart failure.  Hospital Course:  1-Acute on chronic diastolic CHF (congestive heart failure): due to recent discontinuation of lasix and not strict low sodium diet compliance. -will resume lasix at 30mg  daily -will follow low sodium diet and daily weights -close follow up to her renal function -patient will follow with PCP and cardiologist as an outpatient  2-Hypernatremia: resolved and with sodium of 141 range at  discharge.  3-Sinus bradycardia: stable and well controlled with use of coreg -diltiazem and lopressor discontinued  -will continue coreg BID  4-Protein-calorie malnutrition, severe: will continue feeding supplements  5-Type 2 diabetes mellitus with renal manifestations: continue SSI and levemir  6-Aortic stenosis: stable. Patient to continue follow up with cardiology as an outpatient  7-Chronic anticoagulation: continue coumadin   8-Chronic kidney disease, stage III (moderate): Cr slightly elevated with diuresis but GFR essentially the same. -will follow renal function fluctuation with diuresis -BMET to be rechecked during follow up with cardiology and PCP  9-Essential hypertension: much better with adjusted dose of lasix and coreg.  -advise to follow low sodium diet  10-Paroxysmal Atrial fibrillation: with hx of stroke in the past. -will continue b-blocker (now on coreg) -patient is currently sinus bradycardia -continue chronic anticoagulation  11-Hypothyroidism: continue synthroid  12-Vulvar cancer, carcinoma: follow up by PCP and oncology as an outpatient. Problem is apparently stable.  13-hx of CVA with residual speech impairment: continue coumadin -as per husband at baseline; she is back to baseline -continue [place] services -no new deficit   14-HLD: continue lipitor   15-proteus mirabilis in urine (collected from foley bag) -no sign soft infection and no complaints of dysuria -patient most likely colonize -will hold on antibiotics for now  16-positive blood cx; 1/2: secondary to contaminant -no antibiotic treatment needed  Procedures:  See below for x-ray  Consultations:  None   Discharge Exam: Filed Vitals:   11/27/14 0552  BP: 127/97  Pulse: 62  Temp: 97.5 F (36.4 C)  Resp: 20    General: no CP; breathing back to baseline; patient  with good O2 sat on RA; no new focal deficit. No fever, no elevated WBC's Cardiovascular: regular rate, no rubs or  gallops Respiratory: good air movement, no crackles, no wheezing Abd: soft, no guarding, no rebound; colostomy bag in place, no signs of infections. GU: no dysuria, no erythema and chronic foley cath in place  Discharge Instructions You were cared for by a hospitalist during your hospital stay. If you have any questions about your discharge medications or the care you received while you were in the hospital after you are discharged, you can call the unit and asked to speak with the hospitalist on call if the hospitalist that took care of you is not available. Once you are discharged, your primary care physician will handle any further medical issues. Please note that NO REFILLS for any discharge medications will be authorized once you are discharged, as it is imperative that you return to your primary care physician (or establish a relationship with a primary care physician if you do not have one) for your aftercare needs so that they can reassess your need for medications and monitor your lab values.  Discharge Instructions    Diet - low sodium heart healthy    Complete by:  As directed      Discharge instructions    Complete by:  As directed   Check weight on daily basis and follow a low sodium diet (less than 3 grams daily) Take medications as prescribed Arrange follow up with cardiology in 10 days Arange follow up with PCP in 2 weeks Maintain adequate hydration          Current Discharge Medication List    START taking these medications   Details  carvedilol (COREG) 25 MG tablet Take 1 tablet (25 mg total) by mouth 2 (two) times daily with a meal. Qty: 60 tablet, Refills: 1    furosemide (LASIX) 20 MG tablet Take 1.5 tablets (30 mg total) by mouth daily. Qty: 30 tablet, Refills: 45    potassium chloride SA (K-DUR,KLOR-CON) 20 MEQ tablet Take 2 tablets (40 mEq total) by mouth daily. Qty: 60 tablet, Refills: 1      CONTINUE these medications which have NOT CHANGED   Details   acetaminophen (TYLENOL) 325 MG tablet Take 2 tablets (650 mg total) by mouth every 6 (six) hours as needed for mild pain (or Fever >/= 101). Qty: 30 tablet, Refills: 0    atorvastatin (LIPITOR) 20 MG tablet Take 20 mg by mouth daily.    calcitRIOL (ROCALTROL) 0.25 MCG capsule Take 0.25 mcg by mouth every Monday, Wednesday, and Friday.    Cholecalciferol (VITAMIN D-3) 1000 UNITS CAPS Take 1,000 Units by mouth daily.    diphenhydrAMINE (BENADRYL) 25 MG tablet Take 25 mg by mouth as needed.    docusate sodium (COLACE) 50 MG capsule Take 50 mg by mouth 2 (two) times daily.    feeding supplement, GLUCERNA SHAKE, (GLUCERNA SHAKE) LIQD Take 237 mLs by mouth 3 (three) times daily between meals. Qty: 237 mL, Refills: 0    insulin detemir (LEVEMIR) 100 UNIT/ML injection Inject 0.05 mLs (5 Units total) into the skin 2 (two) times daily. Qty: 10 mL, Refills: 2    insulin lispro (HUMALOG) 100 UNIT/ML injection Inject 8-12 Units into the skin 2 (two) times a week. 10 at breakfast, 8 units at lunch, 12 units at supper. Patients husband judges how much insulin to give pt according to glucose lvl as in if pt is not eating gives her a  low dose    levothyroxine (SYNTHROID, LEVOTHROID) 100 MCG tablet Take 100 mcg by mouth as directed. Every day except sunday    nystatin cream (MYCOSTATIN) Apply 1 application topically 2 (two) times daily. Qty: 30 g, Refills: 1    pantoprazole (PROTONIX) 40 MG tablet Take 40 mg by mouth 2 (two) times daily.    polyethylene glycol (MIRALAX / GLYCOLAX) packet Take 17 g by mouth daily as needed for mild constipation, moderate constipation or severe constipation.     traMADol (ULTRAM) 50 MG tablet Take 50 mg by mouth every 6 (six) hours as needed for moderate pain.    vitamin B-12 (CYANOCOBALAMIN) 1000 MCG tablet Take 1,000 mcg by mouth daily.    warfarin (COUMADIN) 4 MG tablet Take 1 tablet (4 mg total) by mouth as directed. Qty: 40 tablet, Refills: 3      STOP  taking these medications     diltiazem (CARDIZEM CD) 180 MG 24 hr capsule      metoprolol (LOPRESSOR) 50 MG tablet        Allergies  Allergen Reactions  . Tenex [Guanfacine Hcl] Other (See Comments)    hypotension  . Crestor [Rosuvastatin] Other (See Comments)    Myalgia   Follow-up Information    Follow up with [facility], MD. Schedule an appointment as soon as possible for a visit in 2 weeks.   Specialty:  Endocrinology   Contact information:   [address] [place] [address] [phone]       Follow up with Candee Furbish, MD. Schedule an appointment as soon as possible for a visit in 10 days.   Specialty:  Cardiology   Contact information:   [address] [place] [address] [phone]       The results of significant diagnostics from this hospitalization (including imaging, microbiology, ancillary and laboratory) are listed below for reference.    Significant Diagnostic Studies: Dg Chest Port 1 View  11/24/2014   CLINICAL DATA:  80 year old female with extreme shortness of breath progressive over the past 3 days  EXAM: PORTABLE CHEST - 1 VIEW  COMPARISON:  Prior chest x-ray and abdominal series 10 04/13/2014  FINDINGS: Stable cardiomegaly. Increased pulmonary vascular congestion now with diffuse mild interstitial edema. Atherosclerotic calcification present within the transverse aorta. No focal airspace consolidation, pneumothorax or large effusion. No acute osseous abnormality.  IMPRESSION: Radiographic findings are most suggestive of mild -moderate CHF.   Electronically Signed   By: Jacqulynn Cadet M.D.   On: 11/24/2014 18:34    Microbiology: Recent Results (from the past 240 hour(s))  Culture, blood (routine x 2)     Status: None (Preliminary result)   Collection Time: 11/24/14  6:42 PM  Result Value Ref Range Status   Specimen Description BLOOD LEFT ANTECUBITAL  Final   Special Requests BOTTLES DRAWN AEROBIC AND ANAEROBIC 4ML   Final   Culture  Setup Time   Final    11/25/2014 01:06 Performed at [facility]    Culture   Final           BLOOD CULTURE RECEIVED NO GROWTH TO DATE CULTURE WILL BE HELD FOR 5 DAYS BEFORE ISSUING A FINAL NEGATIVE REPORT Performed at [facility]    Report Status PENDING  Incomplete  Culture, blood (routine x 2)     Status: None   Collection Time: 11/24/14  6:45 PM  Result Value Ref Range Status   Specimen Description BLOOD RIGHT FOREARM  Final   Special Requests BOTTLES DRAWN AEROBIC ONLY 3ML  Final   Culture  Setup Time   Final    11/25/2014 01:07 Performed at [facility]    Culture   Final    STAPHYLOCOCCUS SPECIES (COAGULASE NEGATIVE) Note: THE SIGNIFICANCE OF ISOLATING THIS ORGANISM FROM A SINGLE SET OF BLOOD CULTURES WHEN MULTIPLE SETS ARE DRAWN IS UNCERTAIN. PLEASE NOTIFY THE MICROBIOLOGY DEPARTMENT WITHIN ONE WEEK IF SPECIATION AND SENSITIVITIES ARE REQUIRED. Note: Gram Stain Report Called to,Read Back By and Verified With: ENE ALI 11/26/14 0950 BY SMITHERSJ Performed at [facility]    Report Status 11/27/2014 FINAL  Final  Urine culture     Status: None   Collection Time: 11/24/14  9:22 PM  Result Value Ref Range Status   Specimen Description URINE, CATHETERIZED BAG PED  Final   Special Requests NONE  Final   Culture  Setup Time   Final    11/25/2014 03:03 Performed at [facility]   Final    >=100,000 COLONIES/ML Performed at [facility]    Culture   Final    PROTEUS MIRABILIS Performed at [facility]    Report Status 11/27/2014 FINAL  Final   Organism ID, Bacteria PROTEUS MIRABILIS  Final      Susceptibility   Proteus mirabilis - MIC*    AMPICILLIN RESISTANT      CEFAZOLIN 8 RESISTANT Resistant     CEFTRIAXONE <=1 SENSITIVE Sensitive     CIPROFLOXACIN <=0.25 SENSITIVE Sensitive     GENTAMICIN <=1 SENSITIVE Sensitive     LEVOFLOXACIN <=0.12 SENSITIVE Sensitive      NITROFURANTOIN 128 RESISTANT Resistant     TOBRAMYCIN <=1 SENSITIVE Sensitive     TRIMETH/SULFA <=20 SENSITIVE Sensitive     PIP/TAZO <=4 SENSITIVE Sensitive     * PROTEUS MIRABILIS  MRSA PCR Screening     Status: None   Collection Time: 11/25/14 12:41 AM  Result Value Ref Range Status   MRSA by PCR NEGATIVE NEGATIVE Final    Comment:        The GeneXpert MRSA Assay (FDA approved for NASAL specimens only), is one component of a comprehensive MRSA colonization surveillance program. It is not intended to diagnose MRSA infection nor to guide or monitor treatment for MRSA infections.      Labs: Basic Metabolic Panel:  Recent Labs Lab 11/25/14 0515 11/25/14 0925 11/25/14 1428 11/26/14 0420 11/27/14 0427  NA 144 144 143 143 141  K 4.4 4.2 4.1 3.6* 3.4*  CL 106 104 101 104 101  CO2 24 25 24 25 29   GLUCOSE 90 88 199* 90 132*  BUN 39* 37* 35* 29* 23  CREATININE 1.53* 1.54* 1.35* 1.29* 1.34*  CALCIUM 9.2 9.4 9.0 8.9 8.8   Liver Function Tests:  Recent Labs Lab 11/24/14 1842  AST 64*  ALT 45*  ALKPHOS 95  BILITOT 0.9  PROT 7.2  ALBUMIN 3.3*   CBC:  Recent Labs Lab 11/24/14 1842  WBC 8.0  NEUTROABS 6.4  HGB 10.7*  HCT 34.7*  MCV 103.3*  PLT 212   BNP (last 3 results)  Recent Labs  11/24/14 1845 11/27/14 0424  PROBNP 9938.0* 6822.0*   CBG:  Recent Labs Lab 11/26/14 1159 11/26/14 1850 11/26/14 2347 11/27/14 0546 11/27/14 1156  GLUCAP 231* 167* 168* 137* 224*    Signed:  Barton Dubois  [facility] 11/27/2014, 2:43 PM

## 2014-11-27 NOTE — Progress Notes (Signed)
ANTICOAGULATION CONSULT NOTE - Follow Up  Pharmacy Consult for Coumadin Indication: atrial fibrillation  Allergies  Allergen Reactions  . Tenex [Guanfacine Hcl] Other (See Comments)    hypotension  . Crestor [Rosuvastatin] Other (See Comments)    Myalgia    Patient Measurements: Height: 5\' 4"  (162.6 cm) Weight: 132 lb 7.9 oz (60.1 kg) IBW/kg (Calculated) : 54.7  Vital Signs: Temp: 97.5 F (36.4 C) (12/04 0552) Temp Source: Oral (12/04 0552) BP: 127/97 mmHg (12/04 0552) Pulse Rate: 62 (12/04 0552)  Labs:  Recent Labs  11/24/14 1842  11/25/14 0150  11/25/14 1428 11/26/14 0420 11/27/14 0427  HGB 10.7*  --   --   --   --   --   --   HCT 34.7*  --   --   --   --   --   --   PLT 212  --   --   --   --   --   --   LABPROT  --   < > 33.6*  --   --  28.5* 24.0*  INR  --   < > 3.27*  --   --  2.65* 2.12*  CREATININE 1.43*  < > 1.49*  < > 1.35* 1.29* 1.34*  < > = values in this interval not displayed.  Estimated Creatinine Clearance: 26 mL/min (by C-G formula based on Cr of 1.34).   Assessment: 88yoF with hx of diastolic heart failure, PAF (on chronic warfarin), CVA, HTN, HLD, CKD (stage 3) and metastatic vulvar cancer; presented to ED with SOB and admitted with acute on chronic diastolic heart failure.  Pharmacy consulted to manage warfarin inpatient.  INR supratherapeutic on admission.   Home dose 4mg  daily except 2mg  on Wednesdays.  Last dose 11/30.  CBC ok on admission  INR 2.12 (trending down)  Diet: Heart healthy/Carb modified + Glucerna feeding supplements  Inpatient Warfarin doses: 12/1-12/2: held due to supratherapeutic INR 12/3: 2 mg  Goal of Therapy:  INR 2-3 Monitor platelets by anticoagulation protocol: Yes   Plan:   Warfarin 4 mg PO x 1 tonight at 1800.  Daily PT/INR  Monitor for signs and symptoms of bleeding.   Lindell Spar, PharmD, BCPS Pager: [phone] 11/27/2014 10:58 AM

## 2014-11-27 NOTE — Plan of Care (Signed)
Problem: Phase II Progression Outcomes Goal: Vital signs remain stable Outcome: Progressing DBPs soft Goal: IV changed to normal saline lock Outcome: Completed/Met Date Met:  11/27/14

## 2014-11-27 NOTE — Progress Notes (Signed)
Physical Therapy Treatment Patient Details Name: [name] MRN: [id number] DOB: Oct 08, 1926 Today's Date: 11/27/2014    History of Present Illness 80 y.o. female admitted for respiratory, distress, hypoxia. with a PMH of recurrent vulvar cancer status post multiple surgeries as well as radiation therapy,  CVA, DM, L eye vision loss.     PT Comments    Improving slowly. Dyspnea 2-3 with activity. Remained on O2 during session.   Follow Up Recommendations  Home health PT;Supervision - Intermittent     Equipment Recommendations  None recommended by PT    Recommendations for Other Services       Precautions / Restrictions Precautions Precautions: Fall Precaution Comments: colostomy, dressing in peri area Restrictions Weight Bearing Restrictions: No    Mobility  Bed Mobility Overal bed mobility: Needs Assistance Bed Mobility: Supine to Sit;Sit to Supine     Supine to sit: Min assist;HOB elevated     General bed mobility comments: extra time. assist to scoot to EOB  Transfers Overall transfer level: Needs assistance Equipment used: Rolling walker (2 wheeled) Transfers: Sit to/from Stand Sit to Stand: Min assist         General transfer comment: assist to rise, stabilize, control descent.  Ambulation/Gait Ambulation/Gait assistance: Min assist Ambulation Distance (Feet): 50 Feet Assistive device: Rolling walker (2 wheeled) Gait Pattern/deviations: Step-through pattern;Decreased stride length     General Gait Details: assist to stabilize intermittently. fatigues fairly easily. remained on O2   Stairs            Wheelchair Mobility    Modified Rankin (Stroke Patients Only)       Balance                                    Cognition Arousal/Alertness: Awake/alert Behavior During Therapy: WFL for tasks assessed/performed Overall Cognitive Status: Within Functional Limits for tasks assessed                      Exercises       General Comments        Pertinent Vitals/Pain Pain Assessment: Faces Faces Pain Scale: Hurts little more Pain Location: bottom    Home Living                      Prior Function            PT Goals (current goals can now be found in the care plan section) Progress towards PT goals: Progressing toward goals    Frequency  Min 3X/week    PT Plan Discharge plan needs to be updated    Co-evaluation             End of Session Equipment Utilized During Treatment: Oxygen Activity Tolerance: Patient limited by fatigue Patient left: in chair;with call bell/phone within reach     Time: 6468-0321 PT Time Calculation (min) (ACUTE ONLY): 26 min  Charges:  $Gait Training: 8-22 mins $Therapeutic Activity: 8-22 mins                    G Codes:      Weston Anna, MPT Pager: [phone]

## 2014-11-27 NOTE — Progress Notes (Signed)
Pt active with [facility] and will continue services.

## 2014-11-30 ENCOUNTER — Ambulatory Visit (INDEPENDENT_AMBULATORY_CARE_PROVIDER_SITE_OTHER): Payer: Medicare Other | Admitting: Cardiology

## 2014-11-30 ENCOUNTER — Other Ambulatory Visit: Payer: Self-pay | Admitting: *Deleted

## 2014-11-30 ENCOUNTER — Ambulatory Visit: Payer: Self-pay | Admitting: Cardiology

## 2014-11-30 LAB — POCT INR: INR: 1.9

## 2014-11-30 MED ORDER — ONDANSETRON HCL 4 MG PO TABS: 4.0000 mg | ORAL_TABLET | Freq: Three times a day (TID) | ORAL | Status: AC | PRN

## 2014-11-30 NOTE — Telephone Encounter (Signed)
Please see below and advise.

## 2014-11-30 NOTE — Telephone Encounter (Signed)
Zofran 4mg  q8hrs prn # 10

## 2014-11-30 NOTE — Telephone Encounter (Signed)
rx sent, patient's husband aware

## 2014-11-30 NOTE — Telephone Encounter (Signed)
Patient is nausea and is throwing up, she would like something call into her pharmacy for it. Please advise.  [phone]

## 2014-12-01 LAB — CULTURE, BLOOD (ROUTINE X 2): Culture: NO GROWTH

## 2014-12-02 ENCOUNTER — Encounter (HOSPITAL_COMMUNITY): Payer: Self-pay

## 2014-12-02 ENCOUNTER — Ambulatory Visit: Payer: Self-pay | Admitting: Endocrinology

## 2014-12-02 ENCOUNTER — Inpatient Hospital Stay (HOSPITAL_COMMUNITY)
Admission: EM | Admit: 2014-12-02 | Discharge: 2014-12-04 | DRG: 535 | Disposition: A | Discharge status: Hospice | Payer: Medicare Other | Attending: Internal Medicine | Admitting: Internal Medicine

## 2014-12-02 ENCOUNTER — Emergency Department (HOSPITAL_COMMUNITY): Payer: Medicare Other

## 2014-12-02 ENCOUNTER — Inpatient Hospital Stay (HOSPITAL_COMMUNITY): Payer: Medicare Other

## 2014-12-02 DIAGNOSIS — I69920 Aphasia following unspecified cerebrovascular disease: Secondary | ICD-10-CM | POA: Diagnosis not present

## 2014-12-02 DIAGNOSIS — E43 Unspecified severe protein-calorie malnutrition: Secondary | ICD-10-CM | POA: Diagnosis present

## 2014-12-02 DIAGNOSIS — C519 Malignant neoplasm of vulva, unspecified: Secondary | ICD-10-CM | POA: Diagnosis present

## 2014-12-02 DIAGNOSIS — M25552 Pain in left hip: Secondary | ICD-10-CM | POA: Diagnosis not present

## 2014-12-02 DIAGNOSIS — E785 Hyperlipidemia, unspecified: Secondary | ICD-10-CM | POA: Diagnosis present

## 2014-12-02 DIAGNOSIS — I5033 Acute on chronic diastolic (congestive) heart failure: Secondary | ICD-10-CM | POA: Diagnosis present

## 2014-12-02 DIAGNOSIS — Z7901 Long term (current) use of anticoagulants: Secondary | ICD-10-CM

## 2014-12-02 DIAGNOSIS — N183 Chronic kidney disease, stage 3 (moderate): Secondary | ICD-10-CM | POA: Diagnosis present

## 2014-12-02 DIAGNOSIS — N179 Acute kidney failure, unspecified: Secondary | ICD-10-CM | POA: Diagnosis present

## 2014-12-02 DIAGNOSIS — H3530 Unspecified macular degeneration: Secondary | ICD-10-CM | POA: Diagnosis present

## 2014-12-02 DIAGNOSIS — M25559 Pain in unspecified hip: Secondary | ICD-10-CM | POA: Insufficient documentation

## 2014-12-02 DIAGNOSIS — Z79899 Other long term (current) drug therapy: Secondary | ICD-10-CM | POA: Diagnosis not present

## 2014-12-02 DIAGNOSIS — Z933 Colostomy status: Secondary | ICD-10-CM | POA: Diagnosis not present

## 2014-12-02 DIAGNOSIS — Z6821 Body mass index (BMI) 21.0-21.9, adult: Secondary | ICD-10-CM | POA: Diagnosis not present

## 2014-12-02 DIAGNOSIS — M8580 Other specified disorders of bone density and structure, unspecified site: Secondary | ICD-10-CM | POA: Diagnosis present

## 2014-12-02 DIAGNOSIS — E860 Dehydration: Secondary | ICD-10-CM

## 2014-12-02 DIAGNOSIS — N390 Urinary tract infection, site not specified: Secondary | ICD-10-CM | POA: Diagnosis present

## 2014-12-02 DIAGNOSIS — H5462 Unqualified visual loss, left eye, normal vision right eye: Secondary | ICD-10-CM | POA: Diagnosis present

## 2014-12-02 DIAGNOSIS — I350 Nonrheumatic aortic (valve) stenosis: Secondary | ICD-10-CM | POA: Diagnosis present

## 2014-12-02 DIAGNOSIS — R531 Weakness: Secondary | ICD-10-CM

## 2014-12-02 DIAGNOSIS — Z8711 Personal history of peptic ulcer disease: Secondary | ICD-10-CM

## 2014-12-02 DIAGNOSIS — E1122 Type 2 diabetes mellitus with diabetic chronic kidney disease: Secondary | ICD-10-CM | POA: Diagnosis present

## 2014-12-02 DIAGNOSIS — F329 Major depressive disorder, single episode, unspecified: Secondary | ICD-10-CM | POA: Diagnosis present

## 2014-12-02 DIAGNOSIS — W19XXXA Unspecified fall, initial encounter: Secondary | ICD-10-CM | POA: Diagnosis present

## 2014-12-02 DIAGNOSIS — I480 Paroxysmal atrial fibrillation: Secondary | ICD-10-CM | POA: Diagnosis present

## 2014-12-02 DIAGNOSIS — Y92009 Unspecified place in unspecified non-institutional (private) residence as the place of occurrence of the external cause: Secondary | ICD-10-CM | POA: Diagnosis not present

## 2014-12-02 DIAGNOSIS — I129 Hypertensive chronic kidney disease with stage 1 through stage 4 chronic kidney disease, or unspecified chronic kidney disease: Secondary | ICD-10-CM | POA: Diagnosis present

## 2014-12-02 DIAGNOSIS — R630 Anorexia: Secondary | ICD-10-CM | POA: Insufficient documentation

## 2014-12-02 DIAGNOSIS — Z923 Personal history of irradiation: Secondary | ICD-10-CM

## 2014-12-02 DIAGNOSIS — T502X5A Adverse effect of carbonic-anhydrase inhibitors, benzothiadiazides and other diuretics, initial encounter: Secondary | ICD-10-CM | POA: Diagnosis present

## 2014-12-02 DIAGNOSIS — E039 Hypothyroidism, unspecified: Secondary | ICD-10-CM | POA: Diagnosis present

## 2014-12-02 DIAGNOSIS — S329XXA Fracture of unspecified parts of lumbosacral spine and pelvis, initial encounter for closed fracture: Secondary | ICD-10-CM

## 2014-12-02 DIAGNOSIS — Z7401 Bed confinement status: Secondary | ICD-10-CM | POA: Diagnosis not present

## 2014-12-02 DIAGNOSIS — Z87891 Personal history of nicotine dependence: Secondary | ICD-10-CM | POA: Diagnosis not present

## 2014-12-02 DIAGNOSIS — Z515 Encounter for palliative care: Secondary | ICD-10-CM | POA: Diagnosis not present

## 2014-12-02 DIAGNOSIS — Z794 Long term (current) use of insulin: Secondary | ICD-10-CM | POA: Diagnosis not present

## 2014-12-02 DIAGNOSIS — Z66 Do not resuscitate: Secondary | ICD-10-CM | POA: Diagnosis present

## 2014-12-02 DIAGNOSIS — S32592A Other specified fracture of left pubis, initial encounter for closed fracture: Secondary | ICD-10-CM | POA: Diagnosis present

## 2014-12-02 DIAGNOSIS — R110 Nausea: Secondary | ICD-10-CM | POA: Insufficient documentation

## 2014-12-02 LAB — BASIC METABOLIC PANEL
Anion gap: 15 (ref 5–15)
BUN: 56 mg/dL — ABNORMAL HIGH (ref 6–23)
CO2: 27 mEq/L (ref 19–32)
Calcium: 9.6 mg/dL (ref 8.4–10.5)
Chloride: 98 mEq/L (ref 96–112)
Creatinine, Ser: 2.22 mg/dL — ABNORMAL HIGH (ref 0.50–1.10)
GFR calc Af Amer: 22 mL/min — ABNORMAL LOW (ref >90)
GFR calc non Af Amer: 19 mL/min — ABNORMAL LOW (ref >90)
Glucose, Bld: 167 mg/dL — ABNORMAL HIGH (ref 70–99)
Potassium: 4.7 mEq/L (ref 3.7–5.3)
Sodium: 140 mEq/L (ref 137–147)

## 2014-12-02 LAB — CBC WITH DIFFERENTIAL/PLATELET
Basophils Absolute: 0 10*3/uL (ref 0.0–0.1)
Basophils Relative: 0 % (ref 0–1)
Eosinophils Absolute: 0 10*3/uL (ref 0.0–0.7)
Eosinophils Relative: 0 % (ref 0–5)
HCT: 39.3 % (ref 36.0–46.0)
Hemoglobin: 11.9 g/dL — ABNORMAL LOW (ref 12.0–15.0)
Lymphocytes Relative: 5 % — ABNORMAL LOW (ref 12–46)
Lymphs Abs: 0.7 10*3/uL (ref 0.7–4.0)
MCH: 31.2 pg (ref 26.0–34.0)
MCHC: 30.3 g/dL (ref 30.0–36.0)
MCV: 102.9 fL — ABNORMAL HIGH (ref 78.0–100.0)
Monocytes Absolute: 1.5 10*3/uL — ABNORMAL HIGH (ref 0.1–1.0)
Monocytes Relative: 10 % (ref 3–12)
Neutro Abs: 12.3 10*3/uL — ABNORMAL HIGH (ref 1.7–7.7)
Neutrophils Relative %: 85 % — ABNORMAL HIGH (ref 43–77)
Platelets: 157 10*3/uL (ref 150–400)
RBC: 3.82 MIL/uL — ABNORMAL LOW (ref 3.87–5.11)
RDW: 18.6 % — ABNORMAL HIGH (ref 11.5–15.5)
WBC: 14.5 10*3/uL — ABNORMAL HIGH (ref 4.0–10.5)

## 2014-12-02 LAB — TROPONIN I
Troponin I: <0.3 ng/mL (ref <0.30)
Troponin I: <0.3 ng/mL (ref <0.30)
Troponin I: <0.3 ng/mL (ref <0.30)

## 2014-12-02 LAB — URINALYSIS, ROUTINE W REFLEX MICROSCOPIC
Bilirubin Urine: NEGATIVE
Glucose, UA: NEGATIVE mg/dL
Ketones, ur: NEGATIVE mg/dL
NITRITE: NEGATIVE
PROTEIN: NEGATIVE mg/dL
SPECIFIC GRAVITY, URINE: 1.011 (ref 1.005–1.030)
UROBILINOGEN UA: 0.2 mg/dL (ref 0.0–1.0)
pH: 7.5 (ref 5.0–8.0)

## 2014-12-02 LAB — URINE MICROSCOPIC-ADD ON

## 2014-12-02 LAB — GLUCOSE, CAPILLARY
Glucose-Capillary: 111 mg/dL — ABNORMAL HIGH (ref 70–99)
Glucose-Capillary: 86 mg/dL (ref 70–99)

## 2014-12-02 LAB — LACTIC ACID, PLASMA: Lactic Acid, Venous: 2.1 mmol/L (ref 0.5–2.2)

## 2014-12-02 LAB — PRO B NATRIURETIC PEPTIDE: Pro B Natriuretic peptide (BNP): 11800 pg/mL — ABNORMAL HIGH (ref 0–450)

## 2014-12-02 LAB — PROTIME-INR
INR: 3.31 — ABNORMAL HIGH (ref 0.00–1.49)
Prothrombin Time: 33.9 seconds — ABNORMAL HIGH (ref 11.6–15.2)

## 2014-12-02 LAB — TSH: TSH: 1.14 u[IU]/mL (ref 0.350–4.500)

## 2014-12-02 LAB — MAGNESIUM: Magnesium: 2 mg/dL (ref 1.5–2.5)

## 2014-12-02 MED ORDER — ACETAMINOPHEN 325 MG PO TABS: 650.0000 mg | ORAL_TABLET | Freq: Four times a day (QID) | ORAL | Status: DC | PRN

## 2014-12-02 MED ORDER — CALCITRIOL 0.25 MCG PO CAPS
0.2500 ug | ORAL_CAPSULE | ORAL | Status: DC
  Administered 2014-12-04: 0.25 ug via ORAL
  Filled 2014-12-02: qty 1

## 2014-12-02 MED ORDER — FENTANYL CITRATE 0.05 MG/ML IJ SOLN
50.0000 ug | Freq: Once | INTRAMUSCULAR | Status: AC
  Administered 2014-12-02: 50 ug via INTRAVENOUS
  Filled 2014-12-02: qty 2

## 2014-12-02 MED ORDER — ACETAMINOPHEN 650 MG RE SUPP: 650.0000 mg | Freq: Four times a day (QID) | RECTAL | Status: DC | PRN

## 2014-12-02 MED ORDER — POLYETHYLENE GLYCOL 3350 17 G PO PACK
17.0000 g | PACK | Freq: Every day | ORAL | Status: DC | PRN
  Filled 2014-12-02: qty 1

## 2014-12-02 MED ORDER — CETYLPYRIDINIUM CHLORIDE 0.05 % MT LIQD
7.0000 mL | Freq: Two times a day (BID) | OROMUCOSAL | Status: DC
  Administered 2014-12-02 – 2014-12-04 (×5): 7 mL via OROMUCOSAL

## 2014-12-02 MED ORDER — INSULIN DETEMIR 100 UNIT/ML ~~LOC~~ SOLN
5.0000 [IU] | Freq: Two times a day (BID) | SUBCUTANEOUS | Status: DC
Start: 2014-12-02 — End: 2014-12-04
  Administered 2014-12-02 – 2014-12-04 (×4): 5 [IU] via SUBCUTANEOUS
  Filled 2014-12-02 (×5): qty 0.05

## 2014-12-02 MED ORDER — SODIUM CHLORIDE 0.9 % IV BOLUS (SEPSIS)
500.0000 mL | Freq: Once | INTRAVENOUS | Status: AC
  Administered 2014-12-02: 500 mL via INTRAVENOUS

## 2014-12-02 MED ORDER — DOCUSATE SODIUM 50 MG PO CAPS
50.0000 mg | ORAL_CAPSULE | Freq: Two times a day (BID) | ORAL | Status: DC
  Administered 2014-12-02 – 2014-12-04 (×4): 50 mg via ORAL
  Filled 2014-12-02 (×5): qty 1

## 2014-12-02 MED ORDER — ONDANSETRON HCL 4 MG PO TABS
4.0000 mg | ORAL_TABLET | Freq: Three times a day (TID) | ORAL | Status: DC | PRN
Start: 2014-12-02 — End: 2014-12-02

## 2014-12-02 MED ORDER — CARVEDILOL 3.125 MG PO TABS
3.1250 mg | ORAL_TABLET | Freq: Two times a day (BID) | ORAL | Status: DC
  Administered 2014-12-02 – 2014-12-04 (×3): 3.125 mg via ORAL
  Filled 2014-12-02 (×4): qty 1

## 2014-12-02 MED ORDER — SODIUM CHLORIDE 0.9 % IV SOLN
INTRAVENOUS | Status: DC
  Administered 2014-12-02: 11:00:00 via INTRAVENOUS

## 2014-12-02 MED ORDER — ONDANSETRON HCL 4 MG PO TABS: 4.0000 mg | ORAL_TABLET | Freq: Four times a day (QID) | ORAL | Status: DC | PRN

## 2014-12-02 MED ORDER — INSULIN ASPART 100 UNIT/ML ~~LOC~~ SOLN
0.0000 [IU] | Freq: Three times a day (TID) | SUBCUTANEOUS | Status: DC
  Administered 2014-12-03 (×2): 2 [IU] via SUBCUTANEOUS
  Administered 2014-12-04: 3 [IU] via SUBCUTANEOUS

## 2014-12-02 MED ORDER — CARVEDILOL 25 MG PO TABS: 25.0000 mg | ORAL_TABLET | Freq: Two times a day (BID) | ORAL | Status: DC

## 2014-12-02 MED ORDER — SODIUM CHLORIDE 0.9 % IV SOLN: INTRAVENOUS | Status: DC

## 2014-12-02 MED ORDER — SODIUM CHLORIDE 0.9 % IV SOLN
INTRAVENOUS | Status: DC
  Administered 2014-12-02 – 2014-12-04 (×2): via INTRAVENOUS

## 2014-12-02 MED ORDER — SODIUM CHLORIDE 0.9 % IV SOLN
INTRAVENOUS | Status: AC
Start: 2014-12-02 — End: 2014-12-02
  Administered 2014-12-02: 15:00:00 via INTRAVENOUS

## 2014-12-02 MED ORDER — LEVOTHYROXINE SODIUM 100 MCG PO TABS
100.0000 ug | ORAL_TABLET | ORAL | Status: DC
  Administered 2014-12-03 – 2014-12-04 (×2): 100 ug via ORAL
  Filled 2014-12-02 (×3): qty 1

## 2014-12-02 MED ORDER — PANTOPRAZOLE SODIUM 40 MG PO TBEC
40.0000 mg | DELAYED_RELEASE_TABLET | Freq: Two times a day (BID) | ORAL | Status: DC
  Administered 2014-12-02 – 2014-12-04 (×4): 40 mg via ORAL
  Filled 2014-12-02 (×6): qty 1

## 2014-12-02 MED ORDER — WARFARIN - PHARMACIST DOSING INPATIENT: Freq: Every day | Status: DC

## 2014-12-02 MED ORDER — VITAMIN B-12 1000 MCG PO TABS
1000.0000 ug | ORAL_TABLET | Freq: Every day | ORAL | Status: DC
  Administered 2014-12-03 – 2014-12-04 (×2): 1000 ug via ORAL
  Filled 2014-12-02 (×2): qty 1

## 2014-12-02 MED ORDER — LEVALBUTEROL HCL 0.63 MG/3ML IN NEBU: 0.6300 mg | INHALATION_SOLUTION | Freq: Four times a day (QID) | RESPIRATORY_TRACT | Status: DC | PRN

## 2014-12-02 MED ORDER — MIRTAZAPINE 15 MG PO TABS
15.0000 mg | ORAL_TABLET | Freq: Every day | ORAL | Status: DC
  Administered 2014-12-02 – 2014-12-03 (×2): 15 mg via ORAL
  Filled 2014-12-02 (×3): qty 1

## 2014-12-02 MED ORDER — CAMPHOR-MENTHOL 0.5-0.5 % EX LOTN
TOPICAL_LOTION | CUTANEOUS | Status: DC | PRN
  Administered 2014-12-03: 07:00:00 via TOPICAL
  Filled 2014-12-02: qty 222

## 2014-12-02 MED ORDER — FENTANYL CITRATE 0.05 MG/ML IJ SOLN: 25.0000 ug | INTRAMUSCULAR | Status: DC | PRN

## 2014-12-02 MED ORDER — TRAMADOL HCL 50 MG PO TABS
50.0000 mg | ORAL_TABLET | Freq: Two times a day (BID) | ORAL | Status: DC
  Administered 2014-12-02 – 2014-12-04 (×4): 50 mg via ORAL
  Filled 2014-12-02 (×4): qty 1

## 2014-12-02 MED ORDER — ONDANSETRON HCL 4 MG/2ML IJ SOLN: 4.0000 mg | Freq: Four times a day (QID) | INTRAMUSCULAR | Status: DC | PRN

## 2014-12-02 NOTE — ED Notes (Addendum)
Per EMS, pt from home.  Pt c/o generalized weakness x 2 days.  No cough/congestion or fever.  Pt recently started lasix 2 days ago.  Pt states no n/v/d.  Vitals: Initial 70 systolic.  Final 116/80, hr 112, 98% ra, cbg 160, a-fib with hx of.  20 g LFA - 75-100 cc fluid given in route

## 2014-12-02 NOTE — H&P (Addendum)
[facility] History and Physical  [name]:[id number] DOB: [birth year] DOA: 12/02/2014  Referring physician:  PCP: Elayne Snare, MD   Chief Complaint: Weakness  HPI:  80 y/o female with hx of diastolic heart failure, PAF, CVA (with residual speech impairment), HTN, HLD, CKD (stage3) and metastatic vulvar cancer; presented to ED with SOB and elevated BNP. Found to have acute on chronic diastolic heart failure during last admission DC 12/4, presents with weakness and fall . Her husband and daughter-in-law report that she started having nausea and vomiting after she ate some chocolate ice cream on Monday. She has continued taking Lasix 30mg  over the past several days though denies any fever, chest pain, diarrhea, abdominal pain. She has a chronic indwelling Foley, colostomy, and bed-bound as a result of complications of her cancer. Her husband reports she has been progressively declining and sustained a recent fall on her left side. Found to have  AKI in the setting of overdiuresis. Her Lasix was discontinued by her cardiologist on 11/17 but was restarted at the time of discharge of her most recent hospitalization.Left hip XR confirms fracture. Pro BNP elevated from last DC  on 12/4. Systolic BP in the 03'K on arrival .Her 65 yo husband has been her main caregiver and they also get home health 4-5x's per week. Despite this, husband is getting exhausted by care needs and they feel she is not doing well since surgery several months ago.   Review of Systems: negative for the following  Constitutional: Denies fever, chills, diaphoresis, appetite change and fatigue.  HEENT: Denies photophobia, eye pain, redness, hearing loss, ear pain, congestion, sore throat, rhinorrhea, sneezing, mouth sores, trouble swallowing, neck pain, neck stiffness and tinnitus.  Respiratory: Denies SOB, DOE, cough, chest tightness, and wheezing.  Cardiovascular: Denies chest pain, palpitations and leg swelling.   Gastrointestinal: Positive for nausea and vomiting. Negative for abdominal pain and diarrhea Genitourinary: Denies dysuria, urgency, frequency, hematuria, flank pain and difficulty urinating.  Musculoskeletal: Denies myalgias, back pain, joint swelling, arthralgias and gait problem.  Skin: Denies pallor, rash and wound.  Neurological:Positive for weakness, Denies dizziness, seizures, syncope, weakness, light-headedness, numbness and headaches.  Hematological: Denies adenopathy. Easy bruising, personal or family bleeding history  Psychiatric/Behavioral: Denies suicidal ideation, mood changes, confusion, nervousness, sleep disturbance and agitation       Past Medical History  Diagnosis Date  . Hypertension   . Hyperlipidemia   . Obesity   . Hypothyroidism   . Vulvar cancer, carcinoma 11/03/2011  . Atrial fibrillation   . Cancer 2007    vulva-invasive well diff squamous cell ca  . CVA (cerebral infarction)   . Dyslipidemia   . History of radiation therapy 10/20/2013-12/01/2013    60 gray to perineum area and lower pelvis  . Diabetes mellitus 05-04-14    "Diabetic coma" 1 week ago -tx. by [facility] until pt. feeling improved and stable.  . Shortness of breath     occ. with coughing up phelgm usually mornings  . 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 Abnormal voice 05-04-14    "raspy voice" with inspiratory effort-Dr. Lissa Hoard aware 05-04-14  . Stroke 05/2000    right brain CVA pre H&P 2001-  . Anemia   . Macular degeneration   . Postoperative wound infection 07/01/2014  . Community acquired pneumonia 08/09/2012  . Corneal thinning 11/08/2012  . GI bleed 05/16/2014  . PUD (peptic ulcer disease) 05/26/2014  . Type II or unspecified type diabetes  mellitus with renal manifestations, uncontrolled(250.42) 08/10/2012  . CKD (chronic kidney disease), stage III      Past Surgical History  Procedure Laterality Date  . Vulvar lesion removal  2012  . Breast surgery       Lumpectomy  . Dilation and curettage of uterus    . Cystourethroplasty / ureteroneocystostomy    . Radical vulvectomy    . Lymphadenectomy  2007    R inguinal femoral  . Radical vulvectomy  02/27/12  . Vulvar lesion removal  02/27/2012    Procedure: VULVAR LESION;  Surgeon: Alvino Chapel, MD;  Location: [facility];  Service: Gynecology;  Laterality: N/A;  . Right carotid endarterctomy    . Left eye surgery      , permanently stitched eye together  . Vulvectomy N/A 05/12/2014    Procedure: RADICAL VULVECTOMY WITH COLOSTOMY;  Surgeon: Alvino Chapel, MD;  Location: [facility];  Service: Gynecology;  Laterality: N/A;  . Laparotomy N/A 05/12/2014    Procedure: EXPLORATORY LAPAROTOMY, TOTAL ABDO MINAL HYSTERECTOMY AND BILATERAL SALPINGOOPHERECTOMY;  Surgeon: Alvino Chapel, MD;  Location: [facility];  Service: Gynecology;  Laterality: N/A;  . Esophagogastroduodenoscopy N/A 05/20/2014    Procedure: ESOPHAGOGASTRODUODENOSCOPY (EGD);  Surgeon: Jeryl Columbia, MD;  Location: [facility];  Service: Endoscopy;  Laterality: N/A;      Social History:  reports that she quit smoking about 47 years ago. Her smoking use included Cigarettes. She has a 4.5 pack-year smoking history. She has never used smokeless tobacco. She reports that she does not drink alcohol or use illicit drugs.    Allergies  Allergen Reactions  . Tenex [Guanfacine Hcl] Other (See Comments)    hypotension  . Crestor [Rosuvastatin] Other (See Comments)    Myalgia    Family History  Problem Relation Age of Onset  . Diabetes type II    . Coronary artery disease Mother   . Lung cancer Brother   . Diabetes type II Brother      Prior to Admission medications   Medication Sig Start Date End Date Taking? Authorizing Provider  acetaminophen (TYLENOL) 325 MG tablet Take 2 tablets (650 mg total) by mouth every 6 (six) hours as needed for mild pain (or Fever >/= 101). 10/22/14  Yes Robbie Lis, MD  atorvastatin  (LIPITOR) 20 MG tablet Take 20 mg by mouth daily.   Yes Historical Provider, MD  calcitRIOL (ROCALTROL) 0.25 MCG capsule Take 0.25 mcg by mouth every Monday, Wednesday, and Friday.   Yes Historical Provider, MD  carvedilol (COREG) 25 MG tablet Take 1 tablet (25 mg total) by mouth 2 (two) times daily with a meal. Patient taking differently: Take 25 mg by mouth daily.  11/27/14  Yes Barton Dubois, MD  Cholecalciferol (VITAMIN D-3) 1000 UNITS CAPS Take 1,000 Units by mouth daily.   Yes Historical Provider, MD  diphenhydrAMINE (BENADRYL) 25 MG tablet Take 25 mg by mouth as needed.   Yes Historical Provider, MD  docusate sodium (COLACE) 50 MG capsule Take 50 mg by mouth 2 (two) times daily.   Yes Historical Provider, MD  feeding supplement, GLUCERNA SHAKE, (GLUCERNA SHAKE) LIQD Take 237 mLs by mouth 3 (three) times daily between meals. 10/22/14  Yes Robbie Lis, MD  furosemide (LASIX) 20 MG tablet Take 1.5 tablets (30 mg total) by mouth daily. 11/27/14  Yes Barton Dubois, MD  insulin detemir (LEVEMIR) 100 UNIT/ML injection Inject 0.05 mLs (5 Units total) into the skin 2 (two) times daily. Patient taking differently:  Inject 18 Units into the skin 2 (two) times daily. Sliding scale 10/22/14  Yes Robbie Lis, MD  insulin lispro (HUMALOG) 100 UNIT/ML injection Inject 8-12 Units into the skin 2 (two) times a week. 10 at breakfast, 8 units at lunch, 12 units at supper. Patients husband judges how much insulin to give pt according to glucose lvl as in if pt is not eating gives her a low dose   Yes Historical Provider, MD  levothyroxine (SYNTHROID, LEVOTHROID) 100 MCG tablet Take 100 mcg by mouth as directed. Every day except sunday   Yes Historical Provider, MD  metoprolol (LOPRESSOR) 50 MG tablet Take 50 mg by mouth 2 (two) times daily.   Yes Historical Provider, MD  nystatin cream (MYCOSTATIN) Apply 1 application topically 2 (two) times daily. Patient taking differently: Apply 1 application topically 2 (two)  times daily as needed (rash).  11/12/14  Yes Elayne Snare, MD  ondansetron (ZOFRAN) 4 MG tablet Take 1 tablet (4 mg total) by mouth every 8 (eight) hours as needed for nausea or vomiting. 11/30/14  Yes Elayne Snare, MD  pantoprazole (PROTONIX) 40 MG tablet Take 40 mg by mouth 2 (two) times daily.   Yes Historical Provider, MD  polyethylene glycol (MIRALAX / GLYCOLAX) packet Take 17 g by mouth daily as needed for mild constipation, moderate constipation or severe constipation.    Yes Historical Provider, MD  potassium chloride (K-DUR) 10 MEQ tablet Take 20 mEq by mouth daily.   Yes Historical Provider, MD  sodium chloride (MURO 128) 2 % ophthalmic solution Place 2 drops into both eyes daily as needed for irritation.   Yes Historical Provider, MD  traMADol (ULTRAM) 50 MG tablet Take 50 mg by mouth 2 (two) times daily.    Yes Historical Provider, MD  vitamin B-12 (CYANOCOBALAMIN) 1000 MCG tablet Take 1,000 mcg by mouth daily.   Yes Historical Provider, MD  warfarin (COUMADIN) 4 MG tablet Take 1 tablet (4 mg total) by mouth as directed. Patient taking differently: Take 4-8 mg by mouth as directed. To take two tablets today 12/02/14. Then 1 tablet every day after. 10/29/14  Yes Candee Furbish, MD  potassium chloride SA (K-DUR,KLOR-CON) 20 MEQ tablet Take 2 tablets (40 mEq total) by mouth daily. Patient not taking: Reported on 12/02/2014 11/27/14   Barton Dubois, MD     Physical Exam: Filed Vitals:   12/02/14 1230 12/02/14 1300 12/02/14 1453 12/02/14 1628  BP: 76/51 97/57 81/50  88/59  Pulse: 93 90 88 84  Temp:   97.7 F (36.5 C)   TempSrc:   Oral   Resp: 17 16 18    Height:    5\' 4"  (1.626 m)  Weight:    56.609 kg (124 lb 12.8 oz)  SpO2: 84% 97% 96%      Constitutional: She is oriented to person, place, and time. No distress.  HENT:  Head: Normocephalic and atraumatic.  Mouth/Throat: No oropharyngeal exudate.  Dry mucous membranes  Eyes: Conjunctivae are normal. Pupils are equal, round, and reactive  to light.  L eye stitched laterally  Cardiovascular: Exam reveals no gallop and no friction rub.  No murmur heard. Tachycardic, a-fib. Difficult to palpate distal pulses  Pulmonary/Chest: Effort normal and breath sounds normal. No respiratory distress. She has no wheezes. She has no rales.  Abdominal:  Colostomy present in RLQ with fecal matter  Genitourinary:  Tenderness around her anorectal area. Packing observed without any drainage.  Musculoskeletal: She exhibits tenderness.  L hip.  Neurological: She is  alert and oriented to person, place, and time.  Skin: She is not diaphoretic.        Labs on Admission:    Basic Metabolic Panel:  Recent Labs Lab 11/26/14 0420 11/27/14 0427 12/02/14 1125 12/02/14 1309  NA 143 141 140  --   K 3.6* 3.4* 4.7  --   CL 104 101 98  --   CO2 25 29 27   --   GLUCOSE 90 132* 167*  --   BUN 29* 23 56*  --   CREATININE 1.29* 1.34* 2.22*  --   CALCIUM 8.9 8.8 9.6  --   MG  --   --   --  2.0   Liver Function Tests: No results for input(s): AST, ALT, ALKPHOS, BILITOT, PROT, ALBUMIN in the last 168 hours. No results for input(s): LIPASE, AMYLASE in the last 168 hours. No results for input(s): AMMONIA in the last 168 hours. CBC:  Recent Labs Lab 12/02/14 1125  WBC 14.5*  NEUTROABS 12.3*  HGB 11.9*  HCT 39.3  MCV 102.9*  PLT 157   Cardiac Enzymes:  Recent Labs Lab 12/02/14 1125 12/02/14 1309  TROPONINI <0.30 <0.30    BNP (last 3 results)  Recent Labs  11/24/14 1845 11/27/14 0424 12/02/14 1125  PROBNP 9938.0* 6822.0* 11800.0*      CBG:  Recent Labs Lab 11/26/14 1850 11/26/14 2347 11/27/14 0546 11/27/14 1156 12/02/14 1719  GLUCAP 167* 168* 137* 224* 111*    Radiological Exams on Admission: Dg Hip Complete Left  12/02/2014   CLINICAL DATA:  Post fall. Left hip pain. Fall at home. Initial encounter.  EXAM: LEFT HIP - COMPLETE 2+ VIEW  COMPARISON:  None.  FINDINGS: Mild symmetric degenerative changes in  the hips bilaterally with joint space narrowing and spurring. Mild diffuse osteopenia. Fractures are noted through the left superior and inferior pubic rami. No femoral neck fracture. No subluxation or dislocation. SI joints are symmetric.  IMPRESSION: Nondisplaced left superior and inferior pubic rami fractures.   Electronically Signed   By: Rolm Baptise M.D.   On: 12/02/2014 11:53   Dg Chest Port 1 View  12/02/2014   CLINICAL DATA:  80 year old female with generalized weakness for 2 days. Initial encounter. Personal history of recurrent vulva cancer.  EXAM: PORTABLE CHEST - 1 VIEW  COMPARISON:  11/24/2014 and earlier.  FINDINGS: Portable AP semi upright view at 1305 hrs. Stable lung volumes. Decreased pulmonary vascularity. Stable cardiac size and mediastinal contours. Stable apical scarring greater on the left. No pneumothorax, pleural effusion or consolidation.  IMPRESSION: Regressed pulmonary interstitial edema with no new cardiopulmonary abnormality identified.   Electronically Signed   By: Lars Pinks M.D.   On: 12/02/2014 13:39    EKG: Independently reviewed.EKG Interpretation   Date/Time: Wednesday December 02 2014 10:20:51 EST Ventricular Rate: 116 PR Interval:  QRS Duration: 75 QT Interval: 350 QTC Calculation: 486 R Axis: 85 Text Interpretation: Atrial fibrillation Borderline right axis deviation  Borderline prolonged QT interval No significant new findings [id number]    Assessment/Plan Active Problems:   Pelvic fracture   ARF (acute renal failure)   Pain in joint, pelvic region and thigh   Nausea without vomiting   Loss of appetite   Palliative care encounter   Pelvic fracture Provide adequate pain control Nonsurgical approach Palliative care consult   Acute on chronic congestive heart failure Hold Lasix in the setting of low blood pressure and worsening renal function Follow renal Tylenol Dose of Coreg decreased to 3.125 mg  twice a day   Acute kidney  injury Start the patient on gentle IV hydration and hold diuretics   Type 2 diabetes mellitus Hold Levemir and start the patient on SSI  Aortic stenosis Seen by cardiology during last admission  Vulvar cancer Hospice eligible Palliative care consultation completed May need long-term nursing home placement, social work consultation  Weakness  Multifactorial PT/OT eval  Code Status:  DO NOT RESUSCITATE Family Communication: bedside Disposition Plan: admit   Time spent: 70 mins   [facility] Pager [phone]  If 7PM-7AM, please contact night-coverage [email or web address] Password [email or web address] 12/02/2014, 5:42 PM

## 2014-12-02 NOTE — ED Notes (Signed)
MD at bedside. 

## 2014-12-02 NOTE — ED Notes (Signed)
Bed: GS81 Expected date:  Expected time:  Means of arrival:  Comments: Ems- elderly, weakness

## 2014-12-02 NOTE — Progress Notes (Signed)
Clinical Social Work Department CLINICAL SOCIAL WORK PLACEMENT NOTE 12/02/2014  Patient:  [name], [name]  Account Number:  [id number] Admit date:  12/02/2014  Clinical Social Worker:  Loletta Specter  Date/time:  12/02/2014 01:42 PM  Clinical Social Work is seeking post-discharge placement for this patient at the following level of care:   [place]   (*CSW will update this form in Epic as items are completed)   12/02/2014  Patient/family provided with [facility] Department of Clinical Social Work's list of facilities offering this level of care within the geographic area requested by the patient (or if unable, by the patient's family).  12/02/2014  Patient/family informed of their freedom to choose among providers that offer the needed level of care, that participate in Medicare, Medicaid or managed care program needed by the patient, have an available bed and are willing to accept the patient.  12/02/2014  Patient/family informed of [facility]' ownership interest in [facility], as well as of the fact that they are under no obligation to receive care at this facility.  PASARR submitted to EDS on 12/02/2014 PASARR number received on 12/02/2014  FL2 transmitted to all facilities in geographic area requested by pt/family on  12/02/2014 FL2 transmitted to all facilities within larger geographic area on   Patient informed that his/her managed care company has contracts with or will negotiate with  certain facilities, including the following:     Patient/family informed of bed offers received:   Patient chooses bed at  Physician recommends and patient chooses bed at    Patient to be transferred to  on   Patient to be transferred to facility by  Patient and family notified of transfer on  Name of family member notified:    The following physician request were entered in Epic:   Additional Comments:  Noreene Larsson [phone]  ED CSW 12/02/2014 1343pm

## 2014-12-02 NOTE — Progress Notes (Signed)
UR completed 

## 2014-12-02 NOTE — ED Notes (Addendum)
Social worker at bedside to discuss options continued care after leaving the hospital.

## 2014-12-02 NOTE — ED Provider Notes (Signed)
CSN: [id number]     Arrival date & time 12/02/14  1016 History   First MD Initiated Contact with Patient 12/02/14 1021     Chief Complaint  Patient presents with  . Weakness    HPI  [name] is an 80 year old female who presents with weakness. Her husband and daughter-in-law report that she started having nausea and vomiting after she ate some chocolate ice cream on Monday. She has continued taking Lasix 30mg  over the past several days though denies any fever, chest pain, diarrhea, abdominal pain. She has a chronic indwelling Foley, colostomy, and bed-bound as a result of complications of her cancer. Her husband reports she has been progressively declining and sustained a recent fall on her left side.   Past Medical History  Diagnosis Date  . Hypertension   . Hyperlipidemia   . Obesity   . Hypothyroidism   . Vulvar cancer, carcinoma 11/03/2011  . Atrial fibrillation   . Cancer 2007    vulva-invasive well diff squamous cell ca  . CVA (cerebral infarction)   . Dyslipidemia   . History of radiation therapy 10/20/2013-12/01/2013    60 gray to perineum area and lower pelvis  . Diabetes mellitus 05-04-14    "Diabetic coma" 1 week ago -tx. by EMT [facility] residence until pt. feeling improved and stable.  . Shortness of breath     occ. with coughing up phelgm usually mornings  . 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 Abnormal voice 05-04-14    "raspy voice" with inspiratory effort-Dr. Lissa Hoard aware 05-04-14  . Stroke 05/2000    right brain CVA pre H&P 2001-  . Anemia   . Macular degeneration   . Postoperative wound infection 07/01/2014  . Community acquired pneumonia 08/09/2012  . Corneal thinning 11/08/2012  . GI bleed 05/16/2014  . PUD (peptic ulcer disease) 05/26/2014  . Type II or unspecified type diabetes mellitus with renal manifestations, uncontrolled(250.42) 08/10/2012  . CKD (chronic kidney disease), stage III    Past Surgical History  Procedure Laterality Date  . Vulvar  lesion removal  2012  . Breast surgery      Lumpectomy  . Dilation and curettage of uterus    . Cystourethroplasty / ureteroneocystostomy    . Radical vulvectomy    . Lymphadenectomy  2007    R inguinal femoral  . Radical vulvectomy  02/27/12  . Vulvar lesion removal  02/27/2012    Procedure: VULVAR LESION;  Surgeon: Alvino Chapel, MD;  Location: [facility];  Service: Gynecology;  Laterality: N/A;  . Right carotid endarterctomy    . Left eye surgery      , permanently stitched eye together  . Vulvectomy N/A 05/12/2014    Procedure: RADICAL VULVECTOMY WITH COLOSTOMY;  Surgeon: Alvino Chapel, MD;  Location: [facility];  Service: Gynecology;  Laterality: N/A;  . Laparotomy N/A 05/12/2014    Procedure: EXPLORATORY LAPAROTOMY, TOTAL ABDO MINAL HYSTERECTOMY AND BILATERAL SALPINGOOPHERECTOMY;  Surgeon: Alvino Chapel, MD;  Location: [facility];  Service: Gynecology;  Laterality: N/A;  . Esophagogastroduodenoscopy N/A 05/20/2014    Procedure: ESOPHAGOGASTRODUODENOSCOPY (EGD);  Surgeon: Jeryl Columbia, MD;  Location: [facility];  Service: Endoscopy;  Laterality: N/A;   Family History  Problem Relation Age of Onset  . Diabetes type II    . Coronary artery disease Mother   . Lung cancer Brother   . Diabetes type II Brother    History  Substance Use Topics  .  Smoking status: Former Smoker -- 0.25 packs/day for 18 years    Types: Cigarettes    Quit date: 11/03/1967  . Smokeless tobacco: Never Used  . Alcohol Use: No   OB History    No data available     Review of Systems  Constitutional: Negative for fever.  Respiratory: Negative for shortness of breath.   Cardiovascular: Negative for chest pain.  Gastrointestinal: Positive for nausea and vomiting. Negative for abdominal pain and diarrhea.  Musculoskeletal:       L hip pain  Neurological: Positive for weakness.      Allergies  Tenex and Crestor  Home Medications   Prior to Admission medications   Medication Sig  Start Date End Date Taking? Authorizing Provider  acetaminophen (TYLENOL) 325 MG tablet Take 2 tablets (650 mg total) by mouth every 6 (six) hours as needed for mild pain (or Fever >/= 101). 10/22/14   Robbie Lis, MD  atorvastatin (LIPITOR) 20 MG tablet Take 20 mg by mouth daily.    Historical Provider, MD  calcitRIOL (ROCALTROL) 0.25 MCG capsule Take 0.25 mcg by mouth every Monday, Wednesday, and Friday.    Historical Provider, MD  carvedilol (COREG) 25 MG tablet Take 1 tablet (25 mg total) by mouth 2 (two) times daily with a meal. 11/27/14   Barton Dubois, MD  Cholecalciferol (VITAMIN D-3) 1000 UNITS CAPS Take 1,000 Units by mouth daily.    Historical Provider, MD  diphenhydrAMINE (BENADRYL) 25 MG tablet Take 25 mg by mouth as needed.    Historical Provider, MD  docusate sodium (COLACE) 50 MG capsule Take 50 mg by mouth 2 (two) times daily.    Historical Provider, MD  feeding supplement, GLUCERNA SHAKE, (GLUCERNA SHAKE) LIQD Take 237 mLs by mouth 3 (three) times daily between meals. 10/22/14   Robbie Lis, MD  furosemide (LASIX) 20 MG tablet Take 1.5 tablets (30 mg total) by mouth daily. 11/27/14   Barton Dubois, MD  insulin detemir (LEVEMIR) 100 UNIT/ML injection Inject 0.05 mLs (5 Units total) into the skin 2 (two) times daily. Patient taking differently: Inject 14-18 Units into the skin 2 (two) times daily. Take 14 units in the morning, and 18 units at night. 10/22/14   Robbie Lis, MD  insulin lispro (HUMALOG) 100 UNIT/ML injection Inject 8-12 Units into the skin 2 (two) times a week. 10 at breakfast, 8 units at lunch, 12 units at supper. Patients husband judges how much insulin to give pt according to glucose lvl as in if pt is not eating gives her a low dose    Historical Provider, MD  levothyroxine (SYNTHROID, LEVOTHROID) 100 MCG tablet Take 100 mcg by mouth as directed. Every day except sunday    Historical Provider, MD  nystatin cream (MYCOSTATIN) Apply 1 application topically 2 (two)  times daily. Patient taking differently: Apply 1 application topically 2 (two) times daily as needed (rash).  11/12/14   Elayne Snare, MD  ondansetron (ZOFRAN) 4 MG tablet Take 1 tablet (4 mg total) by mouth every 8 (eight) hours as needed for nausea or vomiting. 11/30/14   Elayne Snare, MD  pantoprazole (PROTONIX) 40 MG tablet Take 40 mg by mouth 2 (two) times daily.    Historical Provider, MD  polyethylene glycol (MIRALAX / GLYCOLAX) packet Take 17 g by mouth daily as needed for mild constipation, moderate constipation or severe constipation.     Historical Provider, MD  potassium chloride SA (K-DUR,KLOR-CON) 20 MEQ tablet Take 2 tablets (40 mEq total)  by mouth daily. 11/27/14   Barton Dubois, MD  traMADol (ULTRAM) 50 MG tablet Take 50 mg by mouth every 6 (six) hours as needed for moderate pain.    Historical Provider, MD  vitamin B-12 (CYANOCOBALAMIN) 1000 MCG tablet Take 1,000 mcg by mouth daily.    Historical Provider, MD  warfarin (COUMADIN) 4 MG tablet Take 1 tablet (4 mg total) by mouth as directed. Patient taking differently: Take 2-4 mg by mouth as directed. Takes 4 mg everyday except take 2 mg on Wednesday. 10/29/14   Candee Furbish, MD   BP 100/64 mmHg  Pulse 98  Temp(Src) 97.4 F (36.3 C) (Oral)  Resp 23  SpO2 98% Physical Exam  Constitutional: She is oriented to person, place, and time. No distress.  HENT:  Head: Normocephalic and atraumatic.  Mouth/Throat: No oropharyngeal exudate.  Dry mucous membranes  Eyes: Conjunctivae are normal. Pupils are equal, round, and reactive to light.  L eye stitched laterally  Cardiovascular: Exam reveals no gallop and no friction rub.   No murmur heard. Tachycardic, a-fib. Difficult to palpate distal pulses  Pulmonary/Chest: Effort normal and breath sounds normal. No respiratory distress. She has no wheezes. She has no rales.  Abdominal:  Colostomy present in RLQ with fecal matter  Genitourinary:  Tenderness around her anorectal area. Packing  observed without any drainage.   Musculoskeletal: She exhibits tenderness.  L hip.  Neurological: She is alert and oriented to person, place, and time.  Skin: She is not diaphoretic.    ED Course  Procedures (including critical care time) Labs Review Labs Reviewed  BASIC METABOLIC PANEL  CBC WITH DIFFERENTIAL  TROPONIN I  PROTIME-INR  PRO B NATRIURETIC PEPTIDE  LACTIC ACID, PLASMA    Imaging Review No results found.   EKG Interpretation   Date/Time:  Wednesday December 02 2014 10:20:51 EST Ventricular Rate:  116 PR Interval:    QRS Duration: 75 QT Interval:  350 QTC Calculation: 486 R Axis:   85 Text Interpretation:  Atrial fibrillation Borderline right axis deviation  Borderline prolonged QT interval No significant new findings [id number]  Confirmed by Kathrynn Humble, MD, Thelma Comp [phone]) on 12/02/2014 10:52:29 AM      MDM   Final diagnoses:  Fall at home, initial encounter  Weakness  Pelvic fracture, closed, initial encounter  AKI (acute kidney injury)  Dehydration  Acute on chronic diastolic CHF (congestive heart failure)    Possible AKI in the setting of overdiuresis. Her Lasix was discontinued by her cardiologist on 11/17 but was restarted at the time of discharge of her most recent hospitalization. EKG findings with right heart strain, Wells' criteria 5.5 points which puts her at moderate risk for PE though she has been adherent to coumadin therapy for her a-fib. Will check BMET, CBC, lactate, PT/INR, hip XR as well. Per husband, she is DNR/DNI.  1318: Labs confirm AKI as well as elevated white count with left-shift. Left hip XR confirms fracture. Pro BNP elevated from last prior on 12/4. UA pending. As her husband had indicated completing paperwork for DNR/DNI, I reviewed that paperwork as well as the MOST form with them given the reasons mentioned before. The patient was there to confirm as well that they would not want CPR, intubation, or a feeding tube would like  limited interventions, like IV fluids, antibiotics, or hospitalization, if deemed necessary.  Charlott Rakes, MD 12/02/14 [place], MD 12/03/14 [phone]

## 2014-12-02 NOTE — Consult Note (Signed)
Patient PI:RJJOA S Eppard      DOB: 07-21-1926      CZY:[id number]     Consult Note from the Palliative Medicine Team at [place] Requested by: Dr Allyson Sabal    PCP: Elayne Snare, MD Reason for Consultation:Goals of Care    Phone [phone]  Assessment/Recommendations: 80 yo female with vulvar CA, CKD, diastolic HF, Afib, Aor Stenosis who presented with weakness, fall.    1.  Code Status: DNR (MOST completed by ED)  2. Goals of Care: Family discussed some with ED and social work today.  SW has started process to look into nursing home options.  Family does not feel like they can care for her at home anymore.  Her 49 yo husband has been her main caregiver and they also get home health 4-5x's per week.  Despite this, husband is getting exhausted by care needs and they feel she is not doing well since surgery several months ago.  MOST form completed in ED and placed in chart.  Family also describes that she has almost no quality of life or things she enjoys anymore. When I asked blatantly, at what point should we focus on not going back to hospital and pure comfort, they were not sure. I suspect they had not thought about this (as most people haven't).  They will think about this some more.  Will need to see what care options SW comes up with for placement.  If going to rehab hospice would likely not be option (but outpatient Palliative would be).  If going to do long term care at nursing home, hospice could likely be utilized there.  It does not sound like home hospice care would be able to sufficiently meet their needs (likely getting just as many if not more home health hours) They need more time to see how she does here and think about this.  They do not feel that she would be able to tolerate another surgery for hip fracture.    3. Symptom Management:   Pain- related to hip fracture. Was on tramadol at home but not relieving pain there. On scheduled tramadol and PRN fentanyl here.  May need to consider hydrocodone over tramadol. Scheduled tylenol would be non-sedating option as well.   Loss of appetite/depression/nausea- Will start remeron to target all these areas. PRN zofran. Currently nausea resolved  4. Psychosocial/Spiritual: Multiple hospital stays with rehab this year.  Came to hospital from home where she lives with her 39 yo husband.  They have been married over 78 years.  Son and daughter in law occaisonally provide assistance at home. They also have home health 4-5x/week to help with medications, bathing, etc    Brief HPI: 80 yo female with PMHx of vulvar CA, aortic stenosis, diastolic HF, Afib who presented from home with weakness and fall.  She has had 2 hospitalizations in the past week and this is her 3rd since October.  Her vulvar cancer was diagnosed in 2007. She has had surgical resections, radiation, chemo with multiple recurrences.  Since her surgery a few months ago, she has not had further cancer directed therapy and has been doing poorly per family.  Since surgery she has had decreased functional status, weight loss, possible depression, poor appetite, and other factors associated with declining health. Family openly states that they are not sure she enjoys doing anything anymore.  On Monday, they noted that she wanted some ice cream and did give this to her. She had  N/V afterwards and also reportedly fell few days ago.  She did not complain about hip pain until today which lead to ER visit and subsequent discovery of fracture.  She was recently started on lasix for decreased urine output. Appetite has been worse past few days.  Husband has been main caregiver at home. She is sleepy this afternoon but denies any pain, N/V, SOB, or other acute complaints currently. Family notes that pain seems to be worse when she is sitting.       PMH:  Past Medical History  Diagnosis Date  . Hypertension   . Hyperlipidemia   . Obesity   . Hypothyroidism   . Vulvar  cancer, carcinoma 11/03/2011  . Atrial fibrillation   . Cancer 2007    vulva-invasive well diff squamous cell ca  . CVA (cerebral infarction)   . Dyslipidemia   . History of radiation therapy 10/20/2013-12/01/2013    60 gray to perineum area and lower pelvis  . Diabetes mellitus 05-04-14    "Diabetic coma" 1 week ago -tx. by EMT At residence until pt. feeling improved and stable.  . Shortness of breath     occ. with coughing up phelgm usually mornings  . 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 Abnormal voice 05-04-14    "raspy voice" with inspiratory effort-Dr. Lissa Hoard aware 05-04-14  . Stroke 05/2000    right brain CVA pre H&P 2001-  . Anemia   . Macular degeneration   . Postoperative wound infection 07/01/2014  . Community acquired pneumonia 08/09/2012  . Corneal thinning 11/08/2012  . GI bleed 05/16/2014  . PUD (peptic ulcer disease) 05/26/2014  . Type II or unspecified type diabetes mellitus with renal manifestations, uncontrolled(250.42) 08/10/2012  . CKD (chronic kidney disease), stage III      PSH: Past Surgical History  Procedure Laterality Date  . Vulvar lesion removal  2012  . Breast surgery      Lumpectomy  . Dilation and curettage of uterus    . Cystourethroplasty / ureteroneocystostomy    . Radical vulvectomy    . Lymphadenectomy  2007    R inguinal femoral  . Radical vulvectomy  02/27/12  . Vulvar lesion removal  02/27/2012    Procedure: VULVAR LESION;  Surgeon: Alvino Chapel, MD;  Location: [facility];  Service: Gynecology;  Laterality: N/A;  . Right carotid endarterctomy    . Left eye surgery      , permanently stitched eye together  . Vulvectomy N/A 05/12/2014    Procedure: RADICAL VULVECTOMY WITH COLOSTOMY;  Surgeon: Alvino Chapel, MD;  Location: [facility];  Service: Gynecology;  Laterality: N/A;  . Laparotomy N/A 05/12/2014    Procedure: EXPLORATORY LAPAROTOMY, TOTAL ABDO MINAL HYSTERECTOMY AND BILATERAL SALPINGOOPHERECTOMY;  Surgeon:  Alvino Chapel, MD;  Location: [facility];  Service: Gynecology;  Laterality: N/A;  . Esophagogastroduodenoscopy N/A 05/20/2014    Procedure: ESOPHAGOGASTRODUODENOSCOPY (EGD);  Surgeon: Jeryl Columbia, MD;  Location: [facility];  Service: Endoscopy;  Laterality: N/A;   I have reviewed the [place] and SH and  If appropriate update it with new information. Allergies  Allergen Reactions  . Tenex [Guanfacine Hcl] Other (See Comments)    hypotension  . Crestor [Rosuvastatin] Other (See Comments)    Myalgia   Scheduled Meds: . antiseptic oral rinse  7 mL Mouth Rinse BID  . [START ON 12/04/2014] calcitRIOL  0.25 mcg Oral Q M,W,F  . carvedilol  25 mg Oral BID WC  .  docusate sodium  50 mg Oral BID  . insulin detemir  5 Units Subcutaneous BID  . [START ON 12/03/2014] levothyroxine  100 mcg Oral Once per day on Mon Tue Wed Thu Fri Sat  . mirtazapine  15 mg Oral QHS  . pantoprazole  40 mg Oral BID  . traMADol  50 mg Oral BID  . [START ON 12/03/2014] vitamin B-12  1,000 mcg Oral Daily  . Warfarin - Pharmacist Dosing Inpatient   Does not apply q1800   Continuous Infusions: . sodium chloride 125 mL/hr at 12/02/14 1501   PRN Meds:.acetaminophen **OR** acetaminophen, fentaNYL, levalbuterol, ondansetron **OR** ondansetron (ZOFRAN) IV, polyethylene glycol    BP 81/50 mmHg  Pulse 88  Temp(Src) 97.7 F (36.5 C) (Oral)  Resp 18  SpO2 96%   PPS: 40  No intake or output data in the 24 hours ending 12/02/14 1516   Physical Exam:  General: Alert, NAD HEENT:  [place], dry mm Chest:   CTAB CVS: Regular rate Abdomen:soft, NT, ND Ext: no edema Skin: warm/dry, scattered ecchymosis  Labs: CBC    Component Value Date/Time   WBC 14.5* 12/02/2014 1125   RBC 3.82* 12/02/2014 1125   HGB 11.9* 12/02/2014 1125   HCT 39.3 12/02/2014 1125   PLT 157 12/02/2014 1125   MCV 102.9* 12/02/2014 1125   MCH 31.2 12/02/2014 1125   MCHC 30.3 12/02/2014 1125   RDW 18.6* 12/02/2014 1125   LYMPHSABS 0.7  12/02/2014 1125   MONOABS 1.5* 12/02/2014 1125   EOSABS 0.0 12/02/2014 1125   BASOSABS 0.0 12/02/2014 1125    BMET    Component Value Date/Time   NA 140 12/02/2014 1125   K 4.7 12/02/2014 1125   CL 98 12/02/2014 1125   CO2 27 12/02/2014 1125   GLUCOSE 167* 12/02/2014 1125   BUN 56* 12/02/2014 1125   CREATININE 2.22* 12/02/2014 1125   CALCIUM 9.6 12/02/2014 1125   GFRNONAA 19* 12/02/2014 1125   GFRAA 22* 12/02/2014 1125    CMP     Component Value Date/Time   NA 140 12/02/2014 1125   K 4.7 12/02/2014 1125   CL 98 12/02/2014 1125   CO2 27 12/02/2014 1125   GLUCOSE 167* 12/02/2014 1125   BUN 56* 12/02/2014 1125   CREATININE 2.22* 12/02/2014 1125   CALCIUM 9.6 12/02/2014 1125   PROT 7.2 11/24/2014 1842   ALBUMIN 3.3* 11/24/2014 1842   AST 64* 11/24/2014 1842   ALT 45* 11/24/2014 1842   ALKPHOS 95 11/24/2014 1842   BILITOT 0.9 11/24/2014 1842   GFRNONAA 19* 12/02/2014 1125   GFRAA 22* 12/02/2014 1125   12/9 CXR IMPRESSION: Regressed pulmonary interstitial edema with no new cardiopulmonary abnormality identified.  12/9 L Hip XR IMPRESSION: Nondisplaced left superior and inferior pubic rami fractures.  Total Time: 70 minutes  Greater than 50%  of this time was spent counseling and coordinating care related to the above assessment and plan.  Doran Clay D.O. Palliative Medicine Team at [facility]  Pager: [phone] Team Phone: [phone]

## 2014-12-02 NOTE — ED Notes (Signed)
MD at bedside to discuss and complete DNR and MOST forms with patient's daughter and husband.

## 2014-12-02 NOTE — Progress Notes (Signed)
Clinical Social Work Department BRIEF PSYCHOSOCIAL ASSESSMENT 12/02/2014  Patient:  [name], [name]     Account Number:  [id number]     [place] date:  12/02/2014  Clinical Social Worker:  Marshell Rieger Inez Catalina  Date/Time:  12/02/2014 01:25 PM  Referred by:  Physician  Date Referred:  12/02/2014 Referred for  SNF Placement  Residential hospice placement   Other Referral:   Interview type:  Patient Other interview type:   patient family    PSYCHOSOCIAL DATA Living Status:  FAMILY Admitted from facility:   Level of care:   Primary support name:  Rusell Steward Primary support relationship to patient:  SPOUSE Degree of support available:   strong , caregiver, present at bedside.    CURRENT CONCERNS Current Concerns  Post-Acute Placement   Other Concerns:    SOCIAL WORK ASSESSMENT / PLAN CSW met with pt, pt spouse, and family at bedside. Pt is alert and oriented however not able to engage much in assessment due to pain. Patient spouse shared that patient has been at home with him and he is providing all the care for patient. Patient spouse states that pt has home health services through [facility]. Pt has been in and out of hosptial, as well as skilled nursing for short term rehab. CSW and pt, and pt family discussed admission status and disposition options. Pt family hopeful for patient to go to skilled nursing for short term rehab if patient is still eligible for rehab days. patient family states they can not afford privately for skilled nursing. Pt family also interested in any resources available to assist with pt care.    CSW to begin placement process for snf. CSW to follow up with pt family regarding available options.   Assessment/plan status:  Psychosocial Support/Ongoing Assessment of Needs Other assessment/ plan:   Information/referral to community resources:   [place] list    PATIENT'S/FAMILY'S RESPONSE TO PLAN OF CARE: Patient family thanked csw for  concern and support. Pt family hopeful for patient to go to skilled nursing for short term rehab once medically stable. Pt family is concerned regarding eligibility for rehab and resources avialable.       [name] [phone]  ED CSW 12/02/2014 1:32 PM

## 2014-12-02 NOTE — Plan of Care (Signed)
Problem: Consults Goal: Skin Care Protocol Initiated - if Braden Score 18 or less If consults are not indicated, leave blank or document N/A  Outcome: Completed/Met Date Met:  12/02/14  Problem: Phase I Progression Outcomes Goal: Voiding-avoid urinary catheter unless indicated Outcome: Not Applicable Date Met:  70/35/00

## 2014-12-02 NOTE — Progress Notes (Signed)
ANTICOAGULATION CONSULT NOTE - Follow Up  Pharmacy Consult for Coumadin Indication: atrial fibrillation  Allergies  Allergen Reactions  . Tenex [Guanfacine Hcl] Other (See Comments)    hypotension  . Crestor [Rosuvastatin] Other (See Comments)    Myalgia    Patient Measurements:    Vital Signs: Temp: 97.4 F (36.3 C) (12/09 1018) Temp Source: Oral (12/09 1018) BP: 97/57 mmHg (12/09 1300) Pulse Rate: 90 (12/09 1300)  Labs:  Recent Labs  11/30/14 12/02/14 1125  HGB  --  11.9*  HCT  --  39.3  PLT  --  157  LABPROT  --  33.9*  INR 1.9 3.31*  CREATININE  --  2.22*  TROPONINI  --  <0.30    Estimated Creatinine Clearance: 15.7 mL/min (by C-G formula based on Cr of 2.22).    Assessment: 88yoF with hx of diastolic heart failure, PAF (on chronic warfarin), CVA, HTN, HLD, CKD (stage 3) and metastatic vulvar cancer recently admitted with acute on chronic diastolic heart failure.  Patient readmitted 12/9 for possible AKI in the setting of overdiuresis.  Pharmacy consulted to manage warfarin inpatient.  INR supratherapeutic on admission.   Home dose 4mg  daily except 2mg  on Wednesdays per anticoagulation clinic.  Patient was recently seen at anticoagulation clinic on 12/7 and her INR was 1.9.  Patient was instructed to take 6 mg on 12/7 and then resume home dose of 4 mg daily except 2 mg on Wednesdays.  Per medication history, patient's family member reports that she was supposed to take 2 tablets (8 mg) today, 12/9.  There appears to be confusion with managing her warfarin.  Last dose reported as 12/8.  INR supratherapeutic today at 3.31.    CBC: Hgb 11.9 (low but stable), platelets ok  Goal of Therapy:  INR 2-3 Monitor platelets by anticoagulation protocol: Yes   Plan:  1.  No warfarin today. 2.  Follow up INR in AM.  Daily INR.  Hershal Coria, PharmD, BCPS Pager: [phone] 12/02/2014 1:19 PM

## 2014-12-03 ENCOUNTER — Telehealth: Payer: Self-pay | Admitting: Endocrinology

## 2014-12-03 LAB — COMPREHENSIVE METABOLIC PANEL
ALBUMIN: 2.5 g/dL — AB (ref 3.5–5.2)
ALK PHOS: 94 U/L (ref 39–117)
ALT: 62 U/L — AB (ref 0–35)
AST: 103 U/L — ABNORMAL HIGH (ref 0–37)
Anion gap: 12 (ref 5–15)
BUN: 52 mg/dL — ABNORMAL HIGH (ref 6–23)
CO2: 23 mEq/L (ref 19–32)
Calcium: 8.3 mg/dL — ABNORMAL LOW (ref 8.4–10.5)
Chloride: 102 mEq/L (ref 96–112)
Creatinine, Ser: 1.73 mg/dL — ABNORMAL HIGH (ref 0.50–1.10)
GFR calc Af Amer: 30 mL/min — ABNORMAL LOW (ref >90)
GFR calc non Af Amer: 26 mL/min — ABNORMAL LOW (ref >90)
Glucose, Bld: 169 mg/dL — ABNORMAL HIGH (ref 70–99)
POTASSIUM: 4.5 meq/L (ref 3.7–5.3)
SODIUM: 137 meq/L (ref 137–147)
Total Bilirubin: 0.5 mg/dL (ref 0.3–1.2)
Total Protein: 6 g/dL (ref 6.0–8.3)

## 2014-12-03 LAB — CBC
HCT: 33.5 % — ABNORMAL LOW (ref 36.0–46.0)
Hemoglobin: 10.2 g/dL — ABNORMAL LOW (ref 12.0–15.0)
MCH: 31.3 pg (ref 26.0–34.0)
MCHC: 30.4 g/dL (ref 30.0–36.0)
MCV: 102.8 fL — ABNORMAL HIGH (ref 78.0–100.0)
Platelets: 141 10*3/uL — ABNORMAL LOW (ref 150–400)
RBC: 3.26 MIL/uL — ABNORMAL LOW (ref 3.87–5.11)
RDW: 18.4 % — ABNORMAL HIGH (ref 11.5–15.5)
WBC: 11.3 10*3/uL — ABNORMAL HIGH (ref 4.0–10.5)

## 2014-12-03 LAB — PROTIME-INR
INR: 3.5 — ABNORMAL HIGH (ref 0.00–1.49)
Prothrombin Time: 35.4 seconds — ABNORMAL HIGH (ref 11.6–15.2)

## 2014-12-03 LAB — GLUCOSE, CAPILLARY
GLUCOSE-CAPILLARY: 159 mg/dL — AB (ref 70–99)
Glucose-Capillary: 158 mg/dL — ABNORMAL HIGH (ref 70–99)
Glucose-Capillary: 146 mg/dL — ABNORMAL HIGH (ref 70–99)

## 2014-12-03 LAB — TROPONIN I: Troponin I: <0.3 ng/mL (ref <0.30)

## 2014-12-03 MED ORDER — NYSTATIN 100000 UNIT/GM EX POWD
Freq: Three times a day (TID) | CUTANEOUS | Status: DC
  Administered 2014-12-03 – 2014-12-04 (×3): via TOPICAL
  Filled 2014-12-03: qty 15

## 2014-12-03 MED ORDER — ENSURE COMPLETE PO LIQD
237.0000 mL | Freq: Three times a day (TID) | ORAL | Status: DC
  Administered 2014-12-03 – 2014-12-04 (×3): 237 mL via ORAL

## 2014-12-03 MED ORDER — CIPROFLOXACIN HCL 500 MG PO TABS
500.0000 mg | ORAL_TABLET | Freq: Every morning | ORAL | Status: DC
  Administered 2014-12-03 – 2014-12-04 (×2): 500 mg via ORAL
  Filled 2014-12-03 (×2): qty 1

## 2014-12-03 MED ORDER — DIPHENHYDRAMINE HCL 25 MG PO CAPS
25.0000 mg | ORAL_CAPSULE | Freq: Four times a day (QID) | ORAL | Status: DC | PRN
Start: 2014-12-03 — End: 2014-12-04
  Administered 2014-12-04 (×2): 25 mg via ORAL
  Filled 2014-12-03 (×2): qty 1

## 2014-12-03 NOTE — Progress Notes (Addendum)
ANTICOAGULATION CONSULT NOTE - Follow Up  Pharmacy Consult for Coumadin Indication: atrial fibrillation  Allergies  Allergen Reactions  . Tenex [Guanfacine Hcl] Other (See Comments)    hypotension  . Crestor [Rosuvastatin] Other (See Comments)    Myalgia    Patient Measurements: Height: 5\' 4"  (162.6 cm) Weight: 124 lb 12.8 oz (56.609 kg) IBW/kg (Calculated) : 54.7  Vital Signs: Temp: 97.5 F (36.4 C) (12/10 0448) Temp Source: Oral (12/10 0448) BP: 114/49 mmHg (12/10 1035) Pulse Rate: 56 (12/10 1035)  Labs:  Recent Labs  12/02/14 1125 12/02/14 1309 12/02/14 1839 12/03/14 0052  HGB 11.9*  --   --  10.2*  HCT 39.3  --   --  33.5*  PLT 157  --   --  141*  LABPROT 33.9*  --   --  35.4*  INR 3.31*  --   --  3.50*  CREATININE 2.22*  --   --  1.73*  TROPONINI <0.30 <0.30 <0.30 <0.30    Estimated Creatinine Clearance: 20.2 mL/min (by C-G formula based on Cr of 1.73).    Assessment: 88yoF with hx of diastolic heart failure, PAF (on chronic warfarin), CVA, HTN, HLD, CKD (stage 3) and metastatic vulvar cancer recently admitted with acute on chronic diastolic heart failure.  Patient readmitted 12/9 for possible AKI in the setting of overdiuresis.  Pharmacy consulted to manage warfarin inpatient.  INR supratherapeutic on admission.   Home dose 4mg  daily except 2mg  on Wednesdays per anticoagulation clinic.  Patient was recently seen at anticoagulation clinic on 12/7 and her INR was 1.9.  Patient was instructed to take 6 mg on 12/7 and then resume home dose of 4 mg daily except 2 mg on Wednesdays.  Per medication history, patient's family member reports that she was supposed to take 2 tablets (8 mg) today, 12/9.  There appears to be confusion with managing her warfarin.  Last dose reported as 12/8.  INR supratherapeutic today at 3.5.    CBC: Hgb/Plt low-normal on admission, now dropped slightly  Noted to have started on Cipro today  Goal of Therapy:  INR 2-3 Monitor  platelets by anticoagulation protocol: Yes   Plan:  1.  No warfarin today.  Would restart conservatively given illness and concomitant Cipro. 2.  Daily PT/INR 3.  Will speak with patient (and family if present) to clarify understanding of home warfarin dosing  Reuel Boom, PharmD Pager: [phone] 12/03/2014, 1:39 PM

## 2014-12-03 NOTE — Progress Notes (Signed)
Patient UE:KCMKL S [name]      DOB: Jul 26, 1926      KJZ:[id number]   Palliative Medicine Team at [facility] Progress Note    Subjective: Pain doing well this morning.  Feels hungry. Slept good last night.  Doesn't engage me much about concerns related to hip fracture. If anything withdraws from conversation.      Filed Vitals:   12/03/14 0448  BP: 115/75  Pulse: 59  Temp: 97.5 F (36.4 C)  Resp: 16   Physical exam: GEN: alert, NAD HEENT: [place], Sclera anicteric CV: RRR LUNGS: CTAB ABD: soft, NT     CBC    Component Value Date/Time   WBC 11.3* 12/03/2014 0052   RBC 3.26* 12/03/2014 0052   HGB 10.2* 12/03/2014 0052   HCT 33.5* 12/03/2014 0052   PLT 141* 12/03/2014 0052   MCV 102.8* 12/03/2014 0052   MCH 31.3 12/03/2014 0052   MCHC 30.4 12/03/2014 0052   RDW 18.4* 12/03/2014 0052   LYMPHSABS 0.7 12/02/2014 1125   MONOABS 1.5* 12/02/2014 1125   EOSABS 0.0 12/02/2014 1125   BASOSABS 0.0 12/02/2014 1125    CMP     Component Value Date/Time   NA 137 12/03/2014 0052   K 4.5 12/03/2014 0052   CL 102 12/03/2014 0052   CO2 23 12/03/2014 0052   GLUCOSE 169* 12/03/2014 0052   BUN 52* 12/03/2014 0052   CREATININE 1.73* 12/03/2014 0052   CALCIUM 8.3* 12/03/2014 0052   PROT 6.0 12/03/2014 0052   ALBUMIN 2.5* 12/03/2014 0052   AST 103* 12/03/2014 0052   ALT 62* 12/03/2014 0052   ALKPHOS 94 12/03/2014 0052   BILITOT 0.5 12/03/2014 0052   GFRNONAA 26* 12/03/2014 0052   GFRAA 30* 12/03/2014 0052       Assessment and plan: 80 yo female with vulvar CA, CKD, diastolic HF, Afib, Aor Stenosis who presented with weakness, fall.   1. Code Status: DNR (MOST completed by ED)  2. Goals of Care: See initial consultation note. Awaiting SW eval. I attempted to contact DIL Debbie and husband Lenny Pastel this morning.  Only able to briefly reach Lenny Pastel who was in a meeting and unable to talk.  I will plan on following up tomorrow but can be reached if more urgent questions arise.     3. Symptom Management:  Pain-Tramadol BID controlling symptoms. Not needing any PRN fentanyl  Loss of appetite/depression/nausea- remeron started 12/9. monitor  4. Psychosocial/Spiritual: Multiple hospital stays with rehab this year. Came to hospital from home where she lives with her 40 yo husband. They have been married over 2 years. Son and daughter in law occaisonally provide assistance at home. They also have home health 4-5x/week to help with medications, bathing, etc  Doran Clay D.O. Palliative Medicine Team at [facility]  Pager: [phone] Team Phone: [phone]

## 2014-12-03 NOTE — Progress Notes (Signed)
INITIAL NUTRITION ASSESSMENT  DOCUMENTATION CODES Per approved criteria  -Severe malnutrition in the context of chronic illness  Pt meets criteria for severe MALNUTRITION in the context of chronic illness as evidenced by PO intake < 75% for > one month, 10% body weight loss in < 6 months.   INTERVENTION: -Recommend diet downgrade to Dys3 (chopped) w/gravy per family request -Recommend Ensure Complete po TID, each supplement provides 350 kcal and 13 grams of protein -Continue with Remeron for appetite stimulation -RD to continue to monitor   NUTRITION DIAGNOSIS: Increased nutrient needs related to wound healing as evidenced by sacral wound.   Goal: Pt to meet >/= 90% of their estimated nutrition needs    Monitor:  Total protein/energy intake, labs, weights, swallow profile  Reason for Assessment: MST  80 y.o. female  Admitting Dx: <principal problem not specified>  ASSESSMENT: 80 y/o female with hx of diastolic heart failure, PAF, CVA (with residual speech impairment), HTN, HLD, CKD (stage3) and metastatic vulvar cancer; presented to ED with SOB and elevated BNP. Found to have acute on chronic diastolic heart failure during last admission DC 12/4, presents with weakness and fall .   -Pt's husband reported pt with poor appetite; ongoing for > 3 months -Diet recall indicates pt with minimal PO intake of foods; requires feeding assistance and tolerates softer foods. Will downgrade diet to Mechanical soft (dys3)and assess tolerance. Tolerates mac and cheese, mashed potatoes, and meat cut up finely. Drinks Glucerna shakes TID -Experiencing ongoing weight loss, has lost 14 lbs in past 5 month per previous medical records (10% body weight loss, severe for time frame) -Pt enjoys strawberry flavor Ensure, will order TID. -Consuming 50% or less of meals.  -Palliative care started pt on Remeron on 12/09. -Receives assistance from home health; however family looking into placement upon d/c  d/t pt's high needs    Height: Ht Readings from Last 1 Encounters:  12/02/14 5\' 4"  (1.626 m)    Weight: Wt Readings from Last 1 Encounters:  12/02/14 124 lb 12.8 oz (56.609 kg)    Ideal Body Weight: 120 lb  % Ideal Body Weight: 103%  Wt Readings from Last 10 Encounters:  12/02/14 124 lb 12.8 oz (56.609 kg)  11/27/14 132 lb 7.9 oz (60.1 kg)  11/13/14 126 lb (57.153 kg)  11/12/14 126 lb (57.153 kg)  11/10/14 126 lb (57.153 kg)  10/18/14 127 lb (57.607 kg)  08/06/14 131 lb 12.8 oz (59.784 kg)  07/10/14 130 lb (58.968 kg)  07/01/14 138 lb 9.6 oz (62.869 kg)  06/22/14 138 lb (62.596 kg)    Usual Body Weight: 150 lb per previous RD note  % Usual Body Weight: 83%  BMI:  Body mass index is 21.41 kg/(m^2).  Estimated Nutritional Needs: Kcal: 1700-1900 Protein: 75-90 Fluid: >/=1700 ml daily  Skin: sacral wound with tunneling and bone probeable  Diet Order: DIET DYS 3  EDUCATION NEEDS: -No education needs identified at this time   Intake/Output Summary (Last 24 hours) at 12/03/14 1145 Last data filed at 12/03/14 0600  Gross per 24 hour  Intake   1555 ml  Output    750 ml  Net    805 ml    Last BM: 12/09; colostomy   Labs:   Recent Labs Lab 11/27/14 0427 12/02/14 1125 12/02/14 1309 12/03/14 0052  NA 141 140  --  137  K 3.4* 4.7  --  4.5  CL 101 98  --  102  CO2 29 27  --  23  BUN 23 56*  --  52*  CREATININE 1.34* 2.22*  --  1.73*  CALCIUM 8.8 9.6  --  8.3*  MG  --   --  2.0  --   GLUCOSE 132* 167*  --  169*    CBG (last 3)   Recent Labs  12/02/14 1719 12/02/14 2117 12/03/14 0755  GLUCAP 111* 86 159*    Scheduled Meds: . antiseptic oral rinse  7 mL Mouth Rinse BID  . [START ON 12/04/2014] calcitRIOL  0.25 mcg Oral Q M,W,F  . carvedilol  3.125 mg Oral BID WC  . docusate sodium  50 mg Oral BID  . feeding supplement (ENSURE COMPLETE)  237 mL Oral TID WC  . insulin aspart  0-9 Units Subcutaneous TID WC  . insulin detemir  5 Units  Subcutaneous BID  . levothyroxine  100 mcg Oral Once per day on Mon Tue Wed Thu Fri Sat  . mirtazapine  15 mg Oral QHS  . pantoprazole  40 mg Oral BID  . traMADol  50 mg Oral BID  . vitamin B-12  1,000 mcg Oral Daily  . Warfarin - Pharmacist Dosing Inpatient   Does not apply q1800    Continuous Infusions: . sodium chloride 75 mL/hr at 12/02/14 2220    Past Medical History  Diagnosis Date  . Hypertension   . Hyperlipidemia   . Obesity   . Hypothyroidism   . Vulvar cancer, carcinoma 11/03/2011  . Atrial fibrillation   . Cancer 2007    vulva-invasive well diff squamous cell ca  . CVA (cerebral infarction)   . Dyslipidemia   . History of radiation therapy 10/20/2013-12/01/2013    60 gray to perineum area and lower pelvis  . Diabetes mellitus 05-04-14    "Diabetic coma" 1 week ago -tx. by EMT At residence until pt. feeling improved and stable.  . Shortness of breath     occ. with coughing up phelgm usually mornings  . 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 Abnormal voice 05-04-14    "raspy voice" with inspiratory effort-Dr. Lissa Hoard aware 05-04-14  . Stroke 05/2000    right brain CVA pre H&P 2001-  . Anemia   . Macular degeneration   . Postoperative wound infection 07/01/2014  . Community acquired pneumonia 08/09/2012  . Corneal thinning 11/08/2012  . GI bleed 05/16/2014  . PUD (peptic ulcer disease) 05/26/2014  . Type II or unspecified type diabetes mellitus with renal manifestations, uncontrolled(250.42) 08/10/2012  . CKD (chronic kidney disease), stage III     Past Surgical History  Procedure Laterality Date  . Vulvar lesion removal  2012  . Breast surgery      Lumpectomy  . Dilation and curettage of uterus    . Cystourethroplasty / ureteroneocystostomy    . Radical vulvectomy    . Lymphadenectomy  2007    R inguinal femoral  . Radical vulvectomy  02/27/12  . Vulvar lesion removal  02/27/2012    Procedure: VULVAR LESION;  Surgeon: Alvino Chapel, MD;   Location: [facility];  Service: Gynecology;  Laterality: N/A;  . Right carotid endarterctomy    . Left eye surgery      , permanently stitched eye together  . Vulvectomy N/A 05/12/2014    Procedure: RADICAL VULVECTOMY WITH COLOSTOMY;  Surgeon: Alvino Chapel, MD;  Location: [facility];  Service: Gynecology;  Laterality: N/A;  . Laparotomy N/A 05/12/2014    Procedure: EXPLORATORY LAPAROTOMY, TOTAL ABDO  MINAL HYSTERECTOMY AND BILATERAL SALPINGOOPHERECTOMY;  Surgeon: Alvino Chapel, MD;  Location: [facility];  Service: Gynecology;  Laterality: N/A;  . Esophagogastroduodenoscopy N/A 05/20/2014    Procedure: ESOPHAGOGASTRODUODENOSCOPY (EGD);  Surgeon: Jeryl Columbia, MD;  Location: [facility];  Service: Endoscopy;  Laterality: N/A;    Atlee Abide MS RD LDN Clinical Dietitian [phone]

## 2014-12-03 NOTE — Telephone Encounter (Signed)
No, assign to [facility]osp[facility] Dr

## 2014-12-03 NOTE — Telephone Encounter (Signed)
Pt is being discharged with hospice is Dr. Dwyane Dee going to be the attending for hospice? Please call Vickie # [phone]

## 2014-12-03 NOTE — Progress Notes (Addendum)
[facility] PROGRESS NOTE  JALISE ZAWISTOWSKI FIE:[id number] DOB: 1926-10-24 DOA: 12/02/2014 PCP: Elayne Snare, MD  Assessment/Plan: Active Problems:   Pelvic fracture   ARF (acute renal failure)   Pain in joint, pelvic region and thigh   Nausea without vomiting   Loss of appetite   Palliative care encounter    Pelvic fracture Provide adequate pain control Nonsurgical approach Palliative care consult  History of atrial fibrillation INR supratherapeutic Continue Coreg Continue Coumadin per pharmacy protocol  Acute on chronic congestive heart failure Hold Lasix in the setting of low blood pressure and worsening renal function Dose of Coreg decreased to 3.125 mg twice a day with hold parameters  UTI, recent history of Proteus mirabilis UTI Sensitive to Cipro, start the patient on ciprofloxacin  Acute kidney injury Continue on gentle IV hydration and hold diuretics, improving creatinine   Type 2 diabetes mellitus Hold Levemir and start the patient on SSI  Aortic stenosis Seen by cardiology during last admission  Vulvar cancer Hospice eligible Palliative care consultation completed May need long-term nursing home placement, social work consultation  Weakness  Multifactorial Home with home hospice tomorrow   Code Status: full Family Communication: Discussed with daughter-in-law by the bedside, they want to take the patient home with home hospice Disposition Plan:  Anticipate discharge home tomorrow   Brief narrative: 80 y/o female with hx of diastolic heart failure, PAF, CVA (with residual speech impairment), HTN, HLD, CKD (stage3) and metastatic vulvar cancer; presented to ED with SOB and elevated BNP. Found to have acute on chronic diastolic heart failure during last admission DC 12/4, presents with weakness and fall . Her husband and daughter-in-law report that she started having nausea and vomiting after she ate some chocolate ice cream on Monday. She has  continued taking Lasix 30mg  over the past several days though denies any fever, chest pain, diarrhea, abdominal pain. She has a chronic indwelling Foley, colostomy, and bed-bound as a result of complications of her cancer. Her husband reports she has been progressively declining and sustained a recent fall on her left side. Found to have AKI in the setting of overdiuresis. Her Lasix was discontinued by her cardiologist on 11/17 but was restarted at the time of discharge of her most recent hospitalization.Left hip XR confirms fracture. Pro BNP elevated from last DC on 12/4. Systolic BP in the 16'S on arrival .Her 37 yo husband has been her main caregiver and they also get home health 4-5x's per week. Despite this, husband is getting exhausted by care needs and they feel she is not doing well since surgery several months ago.   Consultants:  Palliative care  Procedures:  None  Antibiotics:  None  HPI/Subjective: Patient states that she is comfortable, denies any chest pain shortness of breath, afebrile overnight, hemodynamically stable  Objective: Filed Vitals:   12/02/14 2108 12/03/14 0039 12/03/14 0448 12/03/14 1035  BP: 83/47 107/66 115/75 114/49  Pulse: 91  59 56  Temp: 97.4 F (36.3 C)  97.5 F (36.4 C)   TempSrc: Oral  Oral   Resp: 16  16   Height:      Weight:      SpO2: 86%  97%     Intake/Output Summary (Last 24 hours) at 12/03/14 1211 Last data filed at 12/03/14 0600  Gross per 24 hour  Intake   1555 ml  Output    750 ml  Net    805 ml    Exam:  General: alert & oriented x 3  In NAD  Cardiovascular: RRR, nl S1 s2  Respiratory: Decreased breath sounds at the bases, scattered rhonchi, no crackles  Abdomen: soft +BS NT/ND, no masses palpable  Extremities: No cyanosis and no edema      Data Reviewed: Basic Metabolic Panel:  Recent Labs Lab 11/27/14 0427 12/02/14 1125 12/02/14 1309 12/03/14 0052  NA 141 140  --  137  K 3.4* 4.7  --  4.5  CL 101 98   --  102  CO2 29 27  --  23  GLUCOSE 132* 167*  --  169*  BUN 23 56*  --  52*  CREATININE 1.34* 2.22*  --  1.73*  CALCIUM 8.8 9.6  --  8.3*  MG  --   --  2.0  --     Liver Function Tests:  Recent Labs Lab 12/03/14 0052  AST 103*  ALT 62*  ALKPHOS 94  BILITOT 0.5  PROT 6.0  ALBUMIN 2.5*   No results for input(s): LIPASE, AMYLASE in the last 168 hours. No results for input(s): AMMONIA in the last 168 hours.  CBC:  Recent Labs Lab 12/02/14 1125 12/03/14 0052  WBC 14.5* 11.3*  NEUTROABS 12.3*  --   HGB 11.9* 10.2*  HCT 39.3 33.5*  MCV 102.9* 102.8*  PLT 157 141*    Cardiac Enzymes:  Recent Labs Lab 12/02/14 1125 12/02/14 1309 12/02/14 1839 12/03/14 0052  TROPONINI <0.30 <0.30 <0.30 <0.30   BNP (last 3 results)  Recent Labs  11/24/14 1845 11/27/14 0424 12/02/14 1125  PROBNP 9938.0* 6822.0* 11800.0*     CBG:  Recent Labs Lab 11/27/14 1156 12/02/14 1719 12/02/14 2117 12/03/14 0755 12/03/14 1206  GLUCAP 224* 111* 86 159* 158*    Recent Results (from the past 240 hour(s))  Culture, blood (routine x 2)     Status: None   Collection Time: 11/24/14  6:42 PM  Result Value Ref Range Status   Specimen Description BLOOD LEFT ANTECUBITAL  Final   Special Requests BOTTLES DRAWN AEROBIC AND ANAEROBIC 4ML  Final   Culture  Setup Time   Final    11/25/2014 01:06 Performed at [facility]    Culture   Final    NO GROWTH 5 DAYS Performed at [facility]    Report Status 12/01/2014 FINAL  Final  Culture, blood (routine x 2)     Status: None   Collection Time: 11/24/14  6:45 PM  Result Value Ref Range Status   Specimen Description BLOOD RIGHT FOREARM  Final   Special Requests BOTTLES DRAWN AEROBIC ONLY 3ML  Final   Culture  Setup Time   Final    11/25/2014 01:07 Performed at [facility]    Culture   Final    STAPHYLOCOCCUS SPECIES (COAGULASE NEGATIVE) Note: THE SIGNIFICANCE OF ISOLATING THIS ORGANISM FROM A SINGLE SET OF  BLOOD CULTURES WHEN MULTIPLE SETS ARE DRAWN IS UNCERTAIN. PLEASE NOTIFY THE MICROBIOLOGY DEPARTMENT WITHIN ONE WEEK IF SPECIATION AND SENSITIVITIES ARE REQUIRED. Note: Gram Stain Report Called to,Read Back By and Verified With: ENE ALI 11/26/14 0950 BY SMITHERSJ Performed at [facility]    Report Status 11/27/2014 FINAL  Final  Urine culture     Status: None   Collection Time: 11/24/14  9:22 PM  Result Value Ref Range Status   Specimen Description URINE, CATHETERIZED BAG PED  Final   Special Requests NONE  Final   Culture  Setup Time   Final    11/25/2014 03:03 Performed at [facility]    Colony Count   Final    >=100,000 COLONIES/ML Performed at [facility]    Culture   Final    PROTEUS MIRABILIS Performed at [facility]    Report Status 11/27/2014 FINAL  Final   Organism ID, Bacteria PROTEUS MIRABILIS  Final      Susceptibility   Proteus mirabilis - MIC*    AMPICILLIN RESISTANT      CEFAZOLIN 8 RESISTANT Resistant     CEFTRIAXONE <=1 SENSITIVE Sensitive     CIPROFLOXACIN <=0.25 SENSITIVE Sensitive     GENTAMICIN <=1 SENSITIVE Sensitive     LEVOFLOXACIN <=0.12 SENSITIVE Sensitive     NITROFURANTOIN 128 RESISTANT Resistant     TOBRAMYCIN <=1 SENSITIVE Sensitive     TRIMETH/SULFA <=20 SENSITIVE Sensitive     PIP/TAZO <=4 SENSITIVE Sensitive     * PROTEUS MIRABILIS  MRSA PCR Screening     Status: None   Collection Time: 11/25/14 12:41 AM  Result Value Ref Range Status   MRSA by PCR NEGATIVE NEGATIVE Final    Comment:        The GeneXpert MRSA Assay (FDA approved for NASAL specimens only), is one component of a comprehensive MRSA colonization surveillance program. It is not intended to diagnose MRSA infection nor to guide or monitor treatment for MRSA infections.      Studies: Dg Hip Complete Left  12/02/2014   CLINICAL DATA:  Post fall. Left hip pain. Fall at home. Initial encounter.  EXAM: LEFT HIP - COMPLETE 2+ VIEW   COMPARISON:  None.  FINDINGS: Mild symmetric degenerative changes in the hips bilaterally with joint space narrowing and spurring. Mild diffuse osteopenia. Fractures are noted through the left superior and inferior pubic rami. No femoral neck fracture. No subluxation or dislocation. SI joints are symmetric.  IMPRESSION: Nondisplaced left superior and inferior pubic rami fractures.   Electronically Signed   By: Rolm Baptise M.D.   On: 12/02/2014 11:53   Dg Chest Port 1 View  12/02/2014   CLINICAL DATA:  80 year old female with generalized weakness for 2 days. Initial encounter. Personal history of recurrent vulva cancer.  EXAM: PORTABLE CHEST - 1 VIEW  COMPARISON:  11/24/2014 and earlier.  FINDINGS: Portable AP semi upright view at 1305 hrs. Stable lung volumes. Decreased pulmonary vascularity. Stable cardiac size and mediastinal contours. Stable apical scarring greater on the left. No pneumothorax, pleural effusion or consolidation.  IMPRESSION: Regressed pulmonary interstitial edema with no new cardiopulmonary abnormality identified.   Electronically Signed   By: Lars Pinks M.D.   On: 12/02/2014 13:39   Dg Chest Port 1 View  11/24/2014   CLINICAL DATA:  80 year old female with extreme shortness of breath progressive over the past 3 days  EXAM: PORTABLE CHEST - 1 VIEW  COMPARISON:  Prior chest x-ray and abdominal series 10 04/13/2014  FINDINGS: Stable cardiomegaly. Increased pulmonary vascular congestion now with diffuse mild interstitial edema. Atherosclerotic calcification present within the transverse aorta. No focal airspace consolidation, pneumothorax or large effusion. No acute osseous abnormality.  IMPRESSION: Radiographic findings are most suggestive of mild -moderate CHF.   Electronically Signed   By: Jacqulynn Cadet M.D.   On: 11/24/2014 18:34    Scheduled Meds: . antiseptic oral rinse  7 mL Mouth Rinse BID  . [START ON 12/04/2014] calcitRIOL  0.25 mcg Oral Q M,W,F  . carvedilol  3.125 mg Oral  BID WC  . docusate sodium  50 mg Oral BID  . feeding supplement (  ENSURE COMPLETE)  237 mL Oral TID WC  . insulin aspart  0-9 Units Subcutaneous TID WC  . insulin detemir  5 Units Subcutaneous BID  . levothyroxine  100 mcg Oral Once per day on Mon Tue Wed Thu Fri Sat  . mirtazapine  15 mg Oral QHS  . pantoprazole  40 mg Oral BID  . traMADol  50 mg Oral BID  . vitamin B-12  1,000 mcg Oral Daily  . Warfarin - Pharmacist Dosing Inpatient   Does not apply q1800   Continuous Infusions: . sodium chloride 75 mL/hr at 12/02/14 2220    Active Problems:   Pelvic fracture   ARF (acute renal failure)   Pain in joint, pelvic region and thigh   Nausea without vomiting   Loss of appetite   Palliative care encounter    Time spent: 40 minutes   [facility] Pager [phone]. If 7PM-7AM, please contact night-coverage at [email or web address], password [facility] 12/03/2014, 12:11 PM  LOS: 1 day

## 2014-12-03 NOTE — Telephone Encounter (Signed)
Please see below and advise.

## 2014-12-03 NOTE — Progress Notes (Addendum)
Notified by Freddi Starr CMRN, patient and family request services of [facility]) after discharge.  Spoke with pt's husband Joneen Caraway and family Friend, Zadie Cleverly who Mr Youngers refers to as his d-i-l; Ms Gagan was awake off and on during this visit, when awake she was alert, stated she was not in pain but was very bothered by her back "Itches terribly" large bright red raised rash area- pt husband stated they were treating this at home with Nystatin cream; pt also noted to be coughing after any intake- husband stated this has gotten worse this admission - pt has h/o CVA and swallowing issues - discussed with family a bedside evaluation of pt's swallowing may help with recommendations for texture/consistency- staff RN Archer Asa aware a will contact Dr Allyson Sabal; - Mr Balderston and Jackelyn Poling shared the following concerns: pt's swallowing; pain control especially if she has trouble with oral medications - discussed possibility of oral liquid Morphine; pt's itching/ yeast rash - was on Nystatin cream and oral Benadryl at home; streamlining pt's current medications  Mr Copus stated " I know she doesn't have long, I want her to be comfortable she doesn't need any vitamins or even the Coumadin - i know it's to prevent blood clots but she has bigger problems know and I don't think she needs to be having all of this"; both Mr Voorheis and Jackelyn Poling stated they want to avoid rehospitalizations *Writer stated that his concerns would be shared with attending Dr Allyson Sabal and also may ask PMT Dr Deitra Mayo to make recommendations for discharge medications  Writer began education related to hospice services, philosophy and team approach to care; Mr Peyser stated he understands Ms Hinzman's time is very short; he stated his main goal is her comfort; 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 *Please send completed GOLD DNR form home with pt *Please consider sending prescriptions for comfort medication: Liquid Concentrated Morphine will need to note "Dispense Quantity =30 ml"    DME needs discussed - call placed to [facility] to request order be placed with [facility] for Complete Pkg D: fully electric hospital bed with half upper & lower rails, AP&P mattress and overbed table; Complete Oxygen Pkg B pt currently on O2 @ 2LNC continuous; Prn Nebulizer treatments and may also benefit from oral suction set up given swallowing concerns.  [facility] to contact pt's husband Joneen Caraway on c: [phone] or h: [phone] to arrange delivery later today or first thing tomorrow  Initial paperwork faxed to [place] Please notify [facility] when patient is ready to leave unit at d/c call [phone] (or [phone] if after 5 pm); [facility] information and contact numbers also given to husband and Debbie during visit.   Above information shared with [place] Please call with any questions or concerns   Danton Sewer, RN 12/03/2014, 1:39 PM [facility] Liaison [phone]

## 2014-12-03 NOTE — Progress Notes (Signed)
CARE MANAGEMENT NOTE 12/03/2014  Patient:  [name], [name]   Account Number:  [id number]  Date Initiated:  12/03/2014  Documentation initiated by:  Karl Bales  Subjective/Objective Assessment:   pt admitted with cco pain, found to have pelvic fx     Action/Plan:   from home   Anticipated DC Date:  12/04/2014   Anticipated DC Plan:  [place]  In-house referral  Hospice / [place]  CM consult      [facility]  HOSPICE   Choice offered to / List presented to:  C-3 Spouse        HH arranged  HH-1 RN  [place] agency  [facility] [place]   Status of service:  In process, will continue to follow Medicare Important Message given?   (If response is "NO", the following Medicare IM given date fields will be blank) Date Medicare IM given:   Medicare IM given by:   Date Additional Medicare IM given:   Additional Medicare IM given by:    Discharge Disposition:    Per UR Regulation:  Reviewed for med. necessity/level of care/duration of stay  If discussed at [place] of Stay Meetings, dates discussed:    Comments:  12/02/14 MMcGibboney, RN, BSN at [employer] with pt, pt's husband and daughter in Airline pilot at bedside concerning home with Hospice. Hospice choice offered, husband selected [facility] [place]([facility]). Husband request [facility] to manage pt's care at home including MD, PA or NP for pt while at home. Referral given to in house rep with [facility]. Daughter Debbie's cell [phone].

## 2014-12-03 NOTE — Progress Notes (Signed)
PT Cancellation Note  Patient Details Name: DEYJAH KINDEL MRN: [id number] DOB: 11-08-1926  Discontinue Physical Therapy Note.    Reason Eval/Treat Not Completed: Medical issues which prohibited therapy (chart reviewed, noted bedrest and palns for home with Hospice care. PT will sign off. Thank you, Dr. Allyson Sabal, for your response to my text page.)   Claretha Cooper 12/03/2014, 2:27 PM Tresa Endo PT [phone]

## 2014-12-04 LAB — GLUCOSE, CAPILLARY
GLUCOSE-CAPILLARY: 80 mg/dL (ref 70–99)
GLUCOSE-CAPILLARY: 208 mg/dL — AB (ref 70–99)
Glucose-Capillary: 121 mg/dL — ABNORMAL HIGH (ref 70–99)
Glucose-Capillary: 58 mg/dL — ABNORMAL LOW (ref 70–99)

## 2014-12-04 LAB — PROTIME-INR
INR: 2.92 — AB (ref 0.00–1.49)
Prothrombin Time: 30.7 seconds — ABNORMAL HIGH (ref 11.6–15.2)

## 2014-12-04 MED ORDER — CIPROFLOXACIN HCL 500 MG PO TABS: 500.0000 mg | ORAL_TABLET | Freq: Every morning | ORAL | Status: AC

## 2014-12-04 MED ORDER — MIRTAZAPINE 15 MG PO TABS: 15.0000 mg | ORAL_TABLET | Freq: Every day | ORAL | Status: AC

## 2014-12-04 MED ORDER — INSULIN DETEMIR 100 UNIT/ML ~~LOC~~ SOLN: 12.0000 [IU] | Freq: Every day | SUBCUTANEOUS | Status: AC

## 2014-12-04 MED ORDER — HYDROXYZINE PAMOATE 25 MG PO CAPS: 100.0000 mg | ORAL_CAPSULE | Freq: Three times a day (TID) | ORAL | Status: AC | PRN

## 2014-12-04 MED ORDER — FUROSEMIDE 20 MG PO TABS: 30.0000 mg | ORAL_TABLET | ORAL | Status: AC

## 2014-12-04 MED ORDER — CARVEDILOL 3.125 MG PO TABS: 3.1250 mg | ORAL_TABLET | Freq: Two times a day (BID) | ORAL | Status: AC

## 2014-12-04 MED ORDER — MORPHINE SULFATE (CONCENTRATE) 10 MG /0.5 ML PO SOLN: 5.0000 mg | ORAL | Status: AC | PRN

## 2014-12-04 MED ORDER — TRAMADOL HCL 50 MG PO TABS: 50.0000 mg | ORAL_TABLET | Freq: Two times a day (BID) | ORAL | Status: AC

## 2014-12-04 MED ORDER — FUROSEMIDE 10 MG/ML IJ SOLN
40.0000 mg | Freq: Once | INTRAMUSCULAR | Status: AC
  Administered 2014-12-04: 40 mg via INTRAVENOUS
  Filled 2014-12-04: qty 4

## 2014-12-04 MED ORDER — ENSURE COMPLETE PO LIQD: 237.0000 mL | Freq: Three times a day (TID) | ORAL | Status: AC

## 2014-12-04 MED ORDER — LEVALBUTEROL HCL 0.63 MG/3ML IN NEBU: 0.6300 mg | INHALATION_SOLUTION | Freq: Four times a day (QID) | RESPIRATORY_TRACT | Status: AC | PRN

## 2014-12-04 NOTE — Plan of Care (Signed)
Problem: Phase II Progression Outcomes Goal: IV changed to normal saline lock Outcome: Not Applicable Date Met:  32/00/37

## 2014-12-04 NOTE — Telephone Encounter (Signed)
Noted, Hospice is aware.

## 2014-12-04 NOTE — Plan of Care (Signed)
Problem: Phase I Progression Outcomes Goal: Pain controlled with appropriate interventions Outcome: Completed/Met Date Met:  12/04/14  Problem: Phase II Progression Outcomes Goal: Vital signs remain stable Outcome: Completed/Met Date Met:  12/04/14

## 2014-12-04 NOTE — Progress Notes (Signed)
[name]) hospital liaison RN visit to follow up discharge plans. Patient seen at bedside, husband and family friend [name] present. Plan remains for patient to d/c home via transport today. Attending physician has been in to see patient/family. Patient's husband reports that patient will go home with liquid morphine and "something for her itching". He reinterated his desire for "comfort" for his wife. Writer followed up with staff RN Anderson Malta who is following up with attending Dr. Allyson Sabal. Anderson Malta aware that prescriptions for liquid morphine and anti itch medication should be accompanying patient at discharge.  Per staff CSW [facility], DNR and MOST form in place for transfer, staff RN to call for transport as soon as discharge is finalized. Patient is scheduled to be admitted to [facility] services this afternoon. Thank you [name] RN [phone] c

## 2014-12-04 NOTE — Discharge Summary (Addendum)
Physician Discharge Summary  [name] MRN: [id number] DOB/AGE: [birth year] 80 y.o.  PCP: Elayne Snare, MD   Admit date: 12/02/2014 Discharge date: 12/04/2014  Discharge Diagnoses:  Patient currently on home hospice   Pelvic fracture   ARF (acute renal failure)   Pain in joint, pelvic region and thigh   Nausea without vomiting   Loss of appetite   Palliative care encounter  Follow-up recommendations Recommend that patient be followed by hospice of [facility] and home without any further hospitalizations     Medication List    STOP taking these medications        atorvastatin 20 MG tablet  Commonly known as:  LIPITOR     calcitRIOL 0.25 MCG capsule  Commonly known as:  ROCALTROL     insulin lispro 100 UNIT/ML injection  Commonly known as:  HUMALOG     levothyroxine 100 MCG tablet  Commonly known as:  SYNTHROID, LEVOTHROID     metoprolol 50 MG tablet  Commonly known as:  LOPRESSOR     potassium chloride SA 20 MEQ tablet  Commonly known as:  K-DUR,KLOR-CON     vitamin B-12 1000 MCG tablet  Commonly known as:  CYANOCOBALAMIN     Vitamin D-3 1000 UNITS Caps     warfarin 4 MG tablet  Commonly known as:  COUMADIN      TAKE these medications        acetaminophen 325 MG tablet  Commonly known as:  TYLENOL  Take 2 tablets (650 mg total) by mouth every 6 (six) hours as needed for mild pain (or Fever >/= 101).     carvedilol 3.125 MG tablet  Commonly known as:  COREG  Take 1 tablet (3.125 mg total) by mouth 2 (two) times daily with a meal.     ciprofloxacin 500 MG tablet  Commonly known as:  CIPRO  Take 1 tablet (500 mg total) by mouth every morning.     diphenhydrAMINE 25 MG tablet  Commonly known as:  BENADRYL  Take 25 mg by mouth as needed.     docusate sodium 50 MG capsule  Commonly known as:  COLACE  Take 50 mg by mouth 2 (two) times daily.     feeding supplement (GLUCERNA SHAKE) Liqd  Take 237 mLs by mouth 3 (three) times daily between  meals.     feeding supplement (ENSURE COMPLETE) Liqd  Take 237 mLs by mouth 3 (three) times daily with meals.     furosemide 20 MG tablet  Commonly known as:  LASIX  Take 1.5 tablets (30 mg total) by mouth daily.     hydrOXYzine 25 MG capsule  Commonly known as:  VISTARIL  Take 4 capsules (100 mg total) by mouth 3 (three) times daily as needed for itching.     insulin detemir 100 UNIT/ML injection  Commonly known as:  LEVEMIR  Inject 0.12 mLs (12 Units total) into the skin at bedtime.     levalbuterol 0.63 MG/3ML nebulizer solution  Commonly known as:  XOPENEX  Take 3 mLs (0.63 mg total) by nebulization every 6 (six) hours as needed for wheezing or shortness of breath.     mirtazapine 15 MG tablet  Commonly known as:  REMERON  Take 1 tablet (15 mg total) by mouth at bedtime.     morphine CONCENTRATE 10 mg / 0.5 ml concentrated solution  Take 0.25 mLs (5 mg total) by mouth every 3 (three) hours as needed for severe pain or shortness of breath.  nystatin cream  Commonly known as:  MYCOSTATIN  Apply 1 application topically 2 (two) times daily.     ondansetron 4 MG tablet  Commonly known as:  ZOFRAN  Take 1 tablet (4 mg total) by mouth every 8 (eight) hours as needed for nausea or vomiting.     pantoprazole 40 MG tablet  Commonly known as:  PROTONIX  Take 40 mg by mouth 2 (two) times daily.     polyethylene glycol packet  Commonly known as:  MIRALAX / GLYCOLAX  Take 17 g by mouth daily as needed for mild constipation, moderate constipation or severe constipation.     potassium chloride 10 MEQ tablet  Commonly known as:  K-DUR  Take 20 mEq by mouth daily.     sodium chloride 2 % ophthalmic solution  Commonly known as:  MURO 128  Place 2 drops into both eyes daily as needed for irritation.     traMADol 50 MG tablet  Commonly known as:  ULTRAM  Take 1 tablet (50 mg total) by mouth 2 (two) times daily.        Discharge Condition: *   Disposition: 01-Home or  Self Care   Consults: Palliative care  Significant Diagnostic Studies: Dg Hip Complete Left  12/02/2014   CLINICAL DATA:  Post fall. Left hip pain. Fall at home. Initial encounter.  EXAM: LEFT HIP - COMPLETE 2+ VIEW  COMPARISON:  None.  FINDINGS: Mild symmetric degenerative changes in the hips bilaterally with joint space narrowing and spurring. Mild diffuse osteopenia. Fractures are noted through the left superior and inferior pubic rami. No femoral neck fracture. No subluxation or dislocation. SI joints are symmetric.  IMPRESSION: Nondisplaced left superior and inferior pubic rami fractures.   Electronically Signed   By: Rolm Baptise M.D.   On: 12/02/2014 11:53   Dg Chest Port 1 View  12/02/2014   CLINICAL DATA:  80 year old female with generalized weakness for 2 days. Initial encounter. Personal history of recurrent vulva cancer.  EXAM: PORTABLE CHEST - 1 VIEW  COMPARISON:  11/24/2014 and earlier.  FINDINGS: Portable AP semi upright view at 1305 hrs. Stable lung volumes. Decreased pulmonary vascularity. Stable cardiac size and mediastinal contours. Stable apical scarring greater on the left. No pneumothorax, pleural effusion or consolidation.  IMPRESSION: Regressed pulmonary interstitial edema with no new cardiopulmonary abnormality identified.   Electronically Signed   By: Lars Pinks M.D.   On: 12/02/2014 13:39   Dg Chest Port 1 View  11/24/2014   CLINICAL DATA:  80 year old female with extreme shortness of breath progressive over the past 3 days  EXAM: PORTABLE CHEST - 1 VIEW  COMPARISON:  Prior chest x-ray and abdominal series 10 04/13/2014  FINDINGS: Stable cardiomegaly. Increased pulmonary vascular congestion now with diffuse mild interstitial edema. Atherosclerotic calcification present within the transverse aorta. No focal airspace consolidation, pneumothorax or large effusion. No acute osseous abnormality.  IMPRESSION: Radiographic findings are most suggestive of mild -moderate CHF.    Electronically Signed   By: Jacqulynn Cadet M.D.   On: 11/24/2014 18:34     Microbiology: Recent Results (from the past 240 hour(s))  Culture, blood (routine x 2)     Status: None   Collection Time: 11/24/14  6:42 PM  Result Value Ref Range Status   Specimen Description BLOOD LEFT ANTECUBITAL  Final   Special Requests BOTTLES DRAWN AEROBIC AND ANAEROBIC 4ML  Final   Culture  Setup Time   Final    11/25/2014 01:06 Performed at [facility]    Culture   Final    NO GROWTH 5 DAYS Performed at [facility]    Report Status 12/01/2014 FINAL  Final  Culture, blood (routine x 2)     Status: None   Collection Time: 11/24/14  6:45 PM  Result Value Ref Range Status   Specimen Description BLOOD RIGHT FOREARM  Final   Special Requests BOTTLES DRAWN AEROBIC ONLY 3ML  Final   Culture  Setup Time   Final    11/25/2014 01:07 Performed at [facility]    Culture   Final    STAPHYLOCOCCUS SPECIES (COAGULASE NEGATIVE) Note: THE SIGNIFICANCE OF ISOLATING THIS ORGANISM FROM A SINGLE SET OF BLOOD CULTURES WHEN MULTIPLE SETS ARE DRAWN IS UNCERTAIN. PLEASE NOTIFY THE MICROBIOLOGY DEPARTMENT WITHIN ONE WEEK IF SPECIATION AND SENSITIVITIES ARE REQUIRED. Note: Gram Stain Report Called to,Read Back By and Verified With: ENE ALI 11/26/14 0950 BY SMITHERSJ Performed at [facility]    Report Status 11/27/2014 FINAL  Final  Urine culture     Status: None   Collection Time: 11/24/14  9:22 PM  Result Value Ref Range Status   Specimen Description URINE, CATHETERIZED BAG PED  Final   Special Requests NONE  Final   Culture  Setup Time   Final    11/25/2014 03:03 Performed at [place]   Final    >=100,000 COLONIES/ML Performed at [facility]    Culture   Final    PROTEUS MIRABILIS Performed at [facility]    Report Status 11/27/2014 FINAL  Final   Organism ID, Bacteria PROTEUS MIRABILIS  Final      Susceptibility    Proteus mirabilis - MIC*    AMPICILLIN RESISTANT      CEFAZOLIN 8 RESISTANT Resistant     CEFTRIAXONE <=1 SENSITIVE Sensitive     CIPROFLOXACIN <=0.25 SENSITIVE Sensitive     GENTAMICIN <=1 SENSITIVE Sensitive     LEVOFLOXACIN <=0.12 SENSITIVE Sensitive     NITROFURANTOIN 128 RESISTANT Resistant     TOBRAMYCIN <=1 SENSITIVE Sensitive     TRIMETH/SULFA <=20 SENSITIVE Sensitive     PIP/TAZO <=4 SENSITIVE Sensitive     * PROTEUS MIRABILIS  MRSA PCR Screening     Status: None   Collection Time: 11/25/14 12:41 AM  Result Value Ref Range Status   MRSA by PCR NEGATIVE NEGATIVE Final    Comment:        The GeneXpert MRSA Assay (FDA approved for NASAL specimens only), is one component of a comprehensive MRSA colonization surveillance program. It is not intended to diagnose MRSA infection nor to guide or monitor treatment for MRSA infections.   Urine culture     Status: None (Preliminary result)   Collection Time: 12/02/14  1:15 PM  Result Value Ref Range Status   Specimen Description URINE, RANDOM  Final   Special Requests NONE  Final   Culture  Setup Time   Final    12/02/2014 23:00 Performed at [place]   Final    >=100,000 COLONIES/ML Performed at [place] Performed at [facility]    Report Status PENDING  Incomplete     Labs: Results for orders placed or performed during the hospital encounter of 12/02/14 (from the past 48 hour(s))  Magnesium     Status: None   Collection  Time: 12/02/14  1:09 PM  Result Value Ref Range   Magnesium 2.0 1.5 - 2.5 mg/dL  Troponin I     Status: None   Collection Time: 12/02/14  1:09 PM  Result Value Ref Range   Troponin I <0.30 <0.30 ng/mL    Comment:        Due to the release kinetics of cTnI, a negative result within the first hours of the onset of symptoms does not rule out myocardial infarction with certainty. If myocardial infarction is  still suspected, repeat the test at appropriate intervals.   Urinalysis, Routine w reflex microscopic     Status: Abnormal   Collection Time: 12/02/14  1:15 PM  Result Value Ref Range   Color, Urine YELLOW YELLOW   APPearance TURBID (A) CLEAR   Specific Gravity, Urine 1.011 1.005 - 1.030   pH 7.5 5.0 - 8.0   Glucose, UA NEGATIVE NEGATIVE mg/dL   Hgb urine dipstick SMALL (A) NEGATIVE   Bilirubin Urine NEGATIVE NEGATIVE   Ketones, ur NEGATIVE NEGATIVE mg/dL   Protein, ur NEGATIVE NEGATIVE mg/dL   Urobilinogen, UA 0.2 0.0 - 1.0 mg/dL   Nitrite NEGATIVE NEGATIVE   Leukocytes, UA LARGE (A) NEGATIVE  Urine culture     Status: None (Preliminary result)   Collection Time: 12/02/14  1:15 PM  Result Value Ref Range   Specimen Description URINE, RANDOM    Special Requests NONE    Culture  Setup Time      12/02/2014 23:00 Performed at [place]      >=100,000 COLONIES/ML Performed at [place] Performed at [facility]    Report Status PENDING   Urine microscopic-add on     Status: Abnormal   Collection Time: 12/02/14  1:15 PM  Result Value Ref Range   Squamous Epithelial / LPF RARE RARE   WBC, UA 21-50 <3 WBC/hpf   RBC / HPF 3-6 <3 RBC/hpf   Bacteria, UA MANY (A) RARE  Glucose, capillary     Status: Abnormal   Collection Time: 12/02/14  5:19 PM  Result Value Ref Range   Glucose-Capillary 111 (H) 70 - 99 mg/dL  Troponin I     Status: None   Collection Time: 12/02/14  6:39 PM  Result Value Ref Range   Troponin I <0.30 <0.30 ng/mL    Comment:        Due to the release kinetics of cTnI, a negative result within the first hours of the onset of symptoms does not rule out myocardial infarction with certainty. If myocardial infarction is still suspected, repeat the test at appropriate intervals.   Glucose, capillary     Status: None   Collection Time: 12/02/14  9:17 PM  Result Value Ref Range    Glucose-Capillary 86 70 - 99 mg/dL  Troponin I     Status: None   Collection Time: 12/03/14 12:52 AM  Result Value Ref Range   Troponin I <0.30 <0.30 ng/mL    Comment:        Due to the release kinetics of cTnI, a negative result within the first hours of the onset of symptoms does not rule out myocardial infarction with certainty. If myocardial infarction is still suspected, repeat the test at appropriate intervals.   Comprehensive metabolic panel     Status: Abnormal   Collection Time: 12/03/14 12:52 AM  Result Value Ref Range  Sodium 137 137 - 147 mEq/L   Potassium 4.5 3.7 - 5.3 mEq/L   Chloride 102 96 - 112 mEq/L   CO2 23 19 - 32 mEq/L   Glucose, Bld 169 (H) 70 - 99 mg/dL   BUN 52 (H) 6 - 23 mg/dL   Creatinine, Ser 1.73 (H) 0.50 - 1.10 mg/dL   Calcium 8.3 (L) 8.4 - 10.5 mg/dL   Total Protein 6.0 6.0 - 8.3 g/dL   Albumin 2.5 (L) 3.5 - 5.2 g/dL   AST 103 (H) 0 - 37 U/L   ALT 62 (H) 0 - 35 U/L   Alkaline Phosphatase 94 39 - 117 U/L   Total Bilirubin 0.5 0.3 - 1.2 mg/dL   GFR calc non Af Amer 26 (L) >90 mL/min   GFR calc Af Amer 30 (L) >90 mL/min    Comment: (NOTE) The eGFR has been calculated using the CKD EPI equation. This calculation has not been validated in all clinical situations. eGFR's persistently <90 mL/min signify possible Chronic Kidney Disease.    Anion gap 12 5 - 15  CBC     Status: Abnormal   Collection Time: 12/03/14 12:52 AM  Result Value Ref Range   WBC 11.3 (H) 4.0 - 10.5 K/uL   RBC 3.26 (L) 3.87 - 5.11 MIL/uL   Hemoglobin 10.2 (L) 12.0 - 15.0 g/dL   HCT 33.5 (L) 36.0 - 46.0 %   MCV 102.8 (H) 78.0 - 100.0 fL   MCH 31.3 26.0 - 34.0 pg   MCHC 30.4 30.0 - 36.0 g/dL   RDW 18.4 (H) 11.5 - 15.5 %   Platelets 141 (L) 150 - 400 K/uL  Protime-INR     Status: Abnormal   Collection Time: 12/03/14 12:52 AM  Result Value Ref Range   Prothrombin Time 35.4 (H) 11.6 - 15.2 seconds   INR 3.50 (H) 0.00 - 1.49  Glucose, capillary     Status: Abnormal    Collection Time: 12/03/14  7:55 AM  Result Value Ref Range   Glucose-Capillary 159 (H) 70 - 99 mg/dL  Glucose, capillary     Status: Abnormal   Collection Time: 12/03/14 12:06 PM  Result Value Ref Range   Glucose-Capillary 158 (H) 70 - 99 mg/dL  Glucose, capillary     Status: Abnormal   Collection Time: 12/03/14  5:19 PM  Result Value Ref Range   Glucose-Capillary 146 (H) 70 - 99 mg/dL  Glucose, capillary     Status: Abnormal   Collection Time: 12/03/14  9:19 PM  Result Value Ref Range   Glucose-Capillary 121 (H) 70 - 99 mg/dL  Protime-INR     Status: Abnormal   Collection Time: 12/04/14  4:02 AM  Result Value Ref Range   Prothrombin Time 30.7 (H) 11.6 - 15.2 seconds   INR 2.92 (H) 0.00 - 1.49  Glucose, capillary     Status: Abnormal   Collection Time: 12/04/14  7:50 AM  Result Value Ref Range   Glucose-Capillary 58 (L) 70 - 99 mg/dL     HPI  80 y/o female with hx of diastolic heart failure, PAF, CVA (with residual speech impairment), HTN, HLD, CKD (stage3) and metastatic vulvar cancer; presented to ED with SOB and elevated BNP. Found to have acute on chronic diastolic heart failure during last admission DC 12/4, presents with weakness and fall . Her husband and daughter-in-law report that she started having nausea and vomiting after she ate some chocolate ice cream on Monday. She has continued taking Lasix  $'30mg'c$  over the past several days though denies any fever, chest pain, diarrhea, abdominal pain. She has a chronic indwelling Foley, colostomy, and bed-bound as a result of complications of her cancer. Her husband reports she has been progressively declining and sustained a recent fall on her left side. Found to have AKI in the setting of overdiuresis. Her Lasix was discontinued by her cardiologist on 11/17 but was restarted at the time of discharge of her most recent hospitalization.Left hip XR confirms fracture. Pro BNP elevated from last DC on 12/4. Systolic BP in the 76'H on  arrival .Her 62 yo husband has been her main caregiver and they also get home health 4-5x's per week. Despite this, husband is getting exhausted by care needs and they feel she is not doing well since surgery several months ago.  HOSPITAL COURSE: Pelvic fracture Provide adequate pain control Nonsurgical approach Palliative care consult obtained Goal is comfort    Acute on chronic congestive heart failure Held Lasix initially in the setting of low blood pressure and worsening renal function, patient was hydrated with IV fluids but started developing shortness of breath Restarted Lasix today with every 48 hours dosing for comfort Dose of Coreg decreased to 3.125 mg twice a day because of low blood pressure   Acute kidney injury Creatinine did improve marginally from  2.22-1.73 Patient will not need any further blood work   Type 2 diabetes mellitus Simplify  home regimen with Levemir once at bedtime Accu-Cheks twice a day Discussed with daughter-in-law to keep CBG between 70-200  Aortic stenosis Seen by cardiology during last admission Prognosis is poor Discontinued Coumadin as patient would not prefer regular INR monitoring Family agreeable  Vulvar cancer Hospice eligible Palliative care consultation completed Being discharged home with hospice     Discharge Exam:    Blood pressure 117/65, pulse 54, temperature 97.5 F (36.4 C), temperature source Axillary, resp. rate 16, height $RemoveBe'5\' 4"'JyvsYMlct$  (1.626 m), weight 56.609 kg (124 lb 12.8 oz), SpO2 94 %.  HENT:  Head: Normocephalic and atraumatic.  Mouth/Throat: No oropharyngeal exudate.  Dry mucous membranes  Eyes: Conjunctivae are normal. Pupils are equal, round, and reactive to light.  L eye stitched laterally  Cardiovascular: Exam reveals no gallop and no friction rub.  No murmur heard. Tachycardic, a-fib. Difficult to palpate distal pulses  Pulmonary/Chest: Effort normal and breath sounds normal. No respiratory  distress. She has no wheezes. She has no rales.  Abdominal:  Colostomy present in RLQ with fecal matter  Genitourinary:  Tenderness around her anorectal area. Packing observed without any drainage.  Musculoskeletal: She exhibits tenderness.          Discharge Instructions    Diet - low sodium heart healthy    Complete by:  As directed      Increase activity slowly    Complete by:  As directed            Follow-up Information    Follow up with [facility], MD. Schedule an appointment as soon as possible for a visit in 1 week.   Specialty:  Endocrinology   Contact information:   [address] [place] [address] [phone]       Signed: Reyne Dumas 12/04/2014, 11:56 AM

## 2014-12-04 NOTE — Progress Notes (Signed)
Inpatient Diabetes Program Recommendations  AACE/ADA: New Consensus Statement on Inpatient Glycemic Control (2013)  Target Ranges:  Prepandial:   less than 140 mg/dL      Peak postprandial:   less than 180 mg/dL (1-2 hours)      Critically ill patients:  140 - 180 mg/dL   Results for [name], [name] (MRN [id number]) as of 12/04/2014 09:19  Ref. Range 12/03/2014 07:55 12/03/2014 12:06 12/03/2014 17:19 12/03/2014 21:19 12/04/2014 07:50  Glucose-Capillary Latest Range: 70-99 mg/dL 159 (H) 158 (H) 146 (H) 121 (H) 58 (L)   Diabetes history: DM2 Outpatient Diabetes medications: Levemir 5 units BID, Humalog 10 units with breakfast, Humalog 8 units with lunch, Humalog 12 units with supper Current orders for Inpatient glycemic control: Levemir 5 units BID, Novolog 0-9 units TID with meals  Inpatient Diabetes Program Recommendations Insulin - Basal: Noted fasting glucose down to 58 mg/dl this morning. Please consider decreasing Levemir to 5 units daily.  Thanks, Barnie Alderman, RN, MSN, CCRN, CDE Diabetes Coordinator Inpatient Diabetes Program [phone] (Team Pager) [phone] ([place] office) [phone] [facility] office)

## 2014-12-04 NOTE — Plan of Care (Signed)
Problem: Phase II Progression Outcomes Goal: Obtain order to discontinue catheter if appropriate Outcome: Not Applicable Date Met:  06/38/68

## 2014-12-04 NOTE — Progress Notes (Signed)
CSW consulted for transportation needs. Patient will need non-emergency ambulance transport home. CSW confirmed home address with patient's daughter & husband at bedside.   RN to call [facility] when ready for transport (ph#: [phone]).  No other CSW needs identified - CSW signing off.   Raynaldo Opitz, [facility] Clinical Social Worker cell #: [phone]

## 2014-12-04 NOTE — Progress Notes (Signed)
SLP Cancellation Note  Patient Details Name: [name] MRN: [id number] DOB: 1926-11-16   Cancelled treatment:         Attempted to see pt for swallow evaluation, however, pt just d/c'd home with hospice.   Quinn Axe T 12/04/2014, 2:12 PM

## 2014-12-04 NOTE — Progress Notes (Signed)
OT Cancellation Note  Patient Details Name: [name] MRN: [id number] DOB: Aug 08, 1926   Cancelled Treatment:    Reason Eval/Treat Not Completed: Other (comment).  Spoke to PT:  Pt has been bed bound and has colostomy and urostomy; she is being discharged home with hospice. Will sign off.   Augustus Zurawski 12/04/2014, 7:11 AM  Lesle Chris, OTR/L [phone] 12/04/2014

## 2014-12-05 LAB — URINE CULTURE

## 2014-12-07 ENCOUNTER — Telehealth: Payer: Self-pay | Admitting: Nurse Practitioner

## 2014-12-07 NOTE — Telephone Encounter (Signed)
Patient's caregiver called this RN to notify our office to cancel all further appointments, patient has been discharged from the hospital with hospice care. Lenna Sciara, NP notified. Caregiver states they are very grateful for the care [name] has received from our office.

## 2014-12-08 ENCOUNTER — Ambulatory Visit: Payer: Self-pay | Admitting: Cardiology

## 2014-12-09 ENCOUNTER — Other Ambulatory Visit: Payer: Self-pay | Admitting: Cardiology

## 2014-12-12 ENCOUNTER — Encounter: Payer: Self-pay | Admitting: *Deleted

## 2014-12-13 IMAGING — CR DG HIP (WITH OR WITHOUT PELVIS) 2-3V*L*
3 series · 3 of 3 positions shown · non-contrast
Comparison: None.

CLINICAL DATA: Post fall. Left hip pain. Fall at home. Initial
encounter.

EXAM:
LEFT HIP - COMPLETE 2+ VIEW

[x pelvis (1 of 2)]
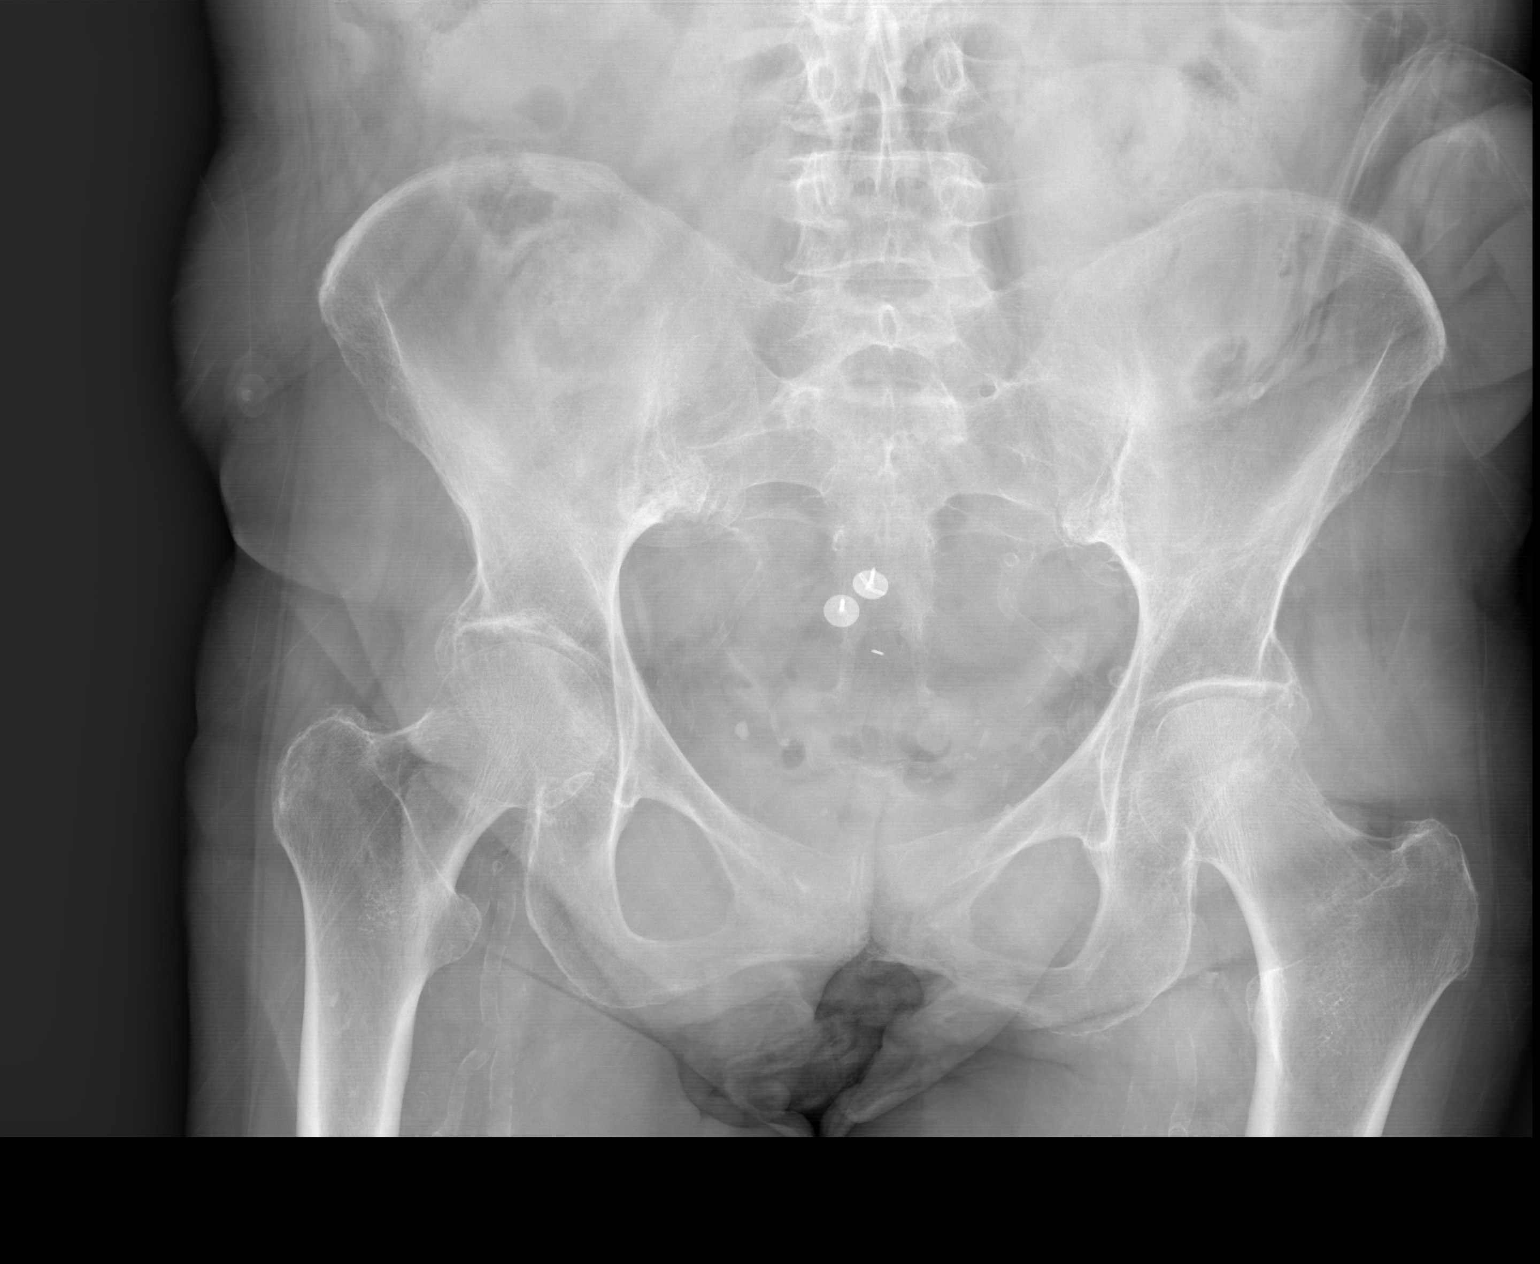

[x pelvis (2 of 2)]
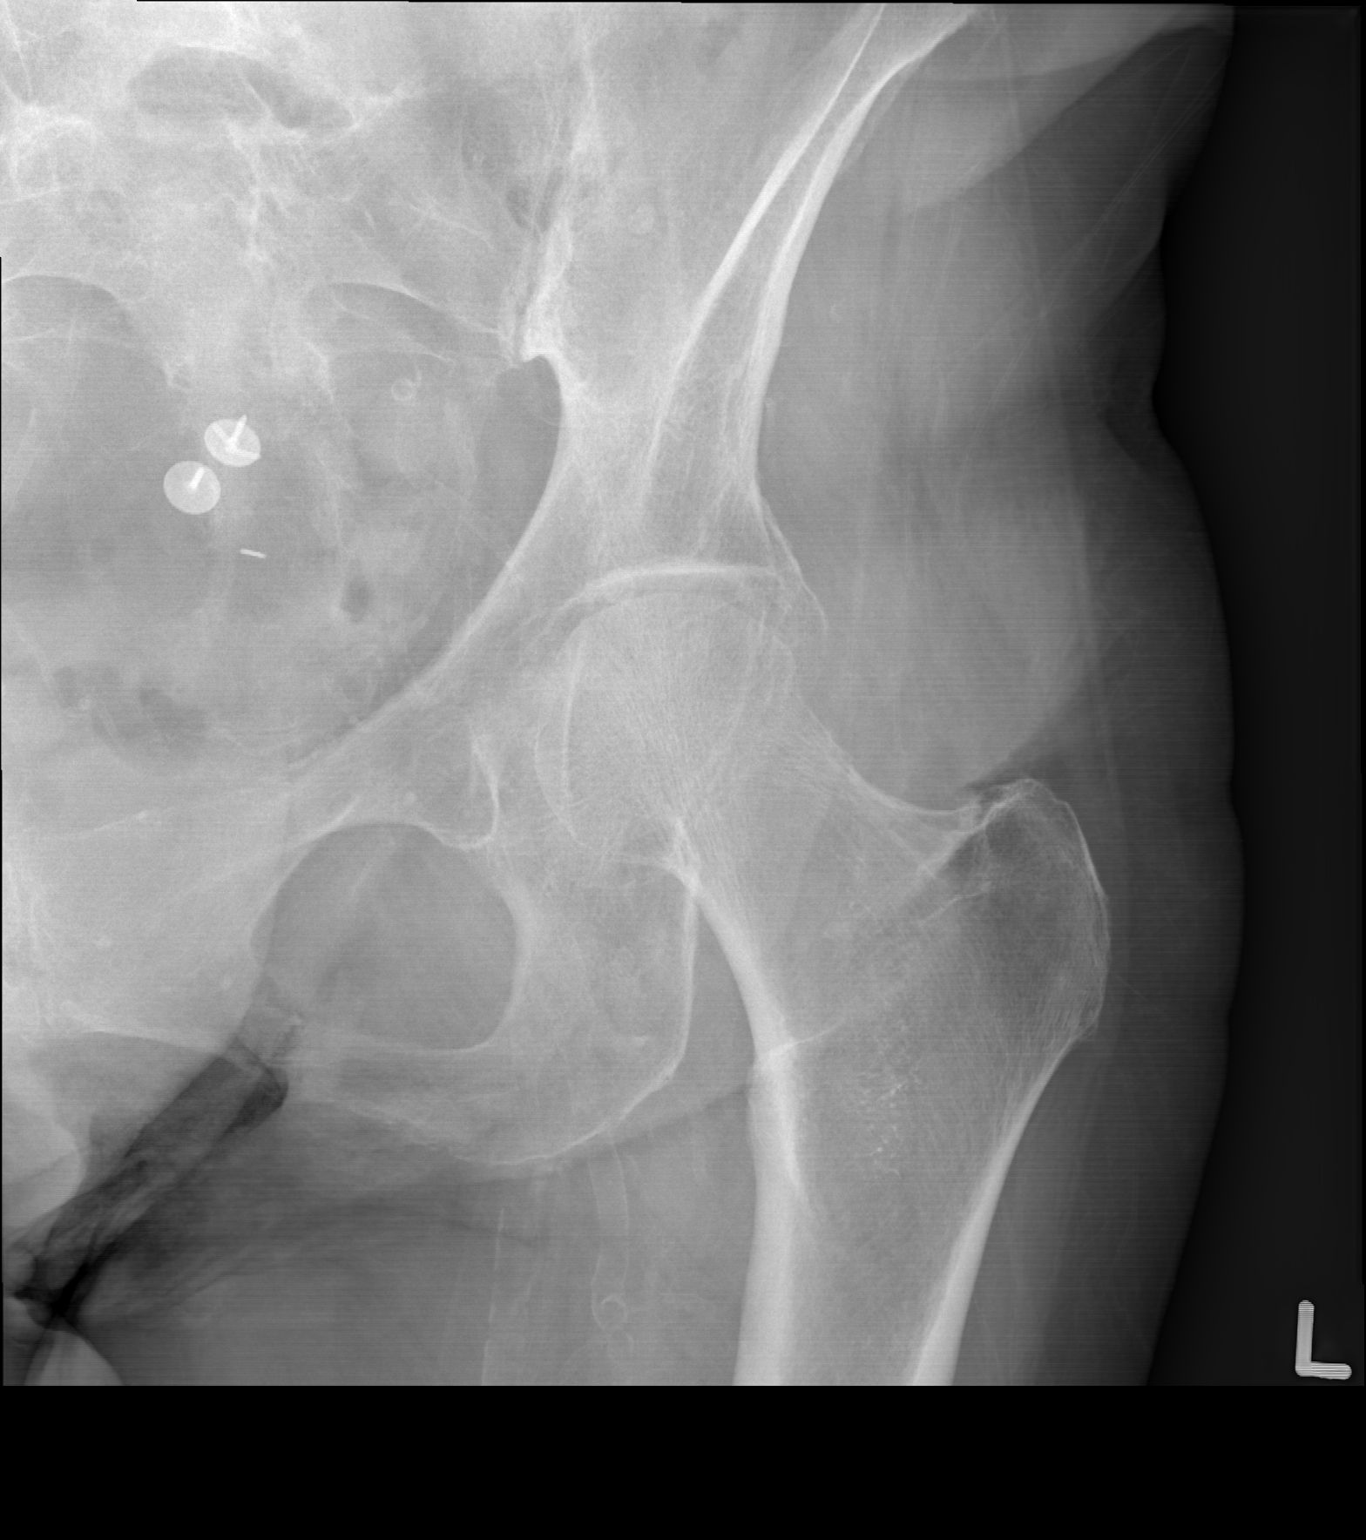

[w hip lat left]
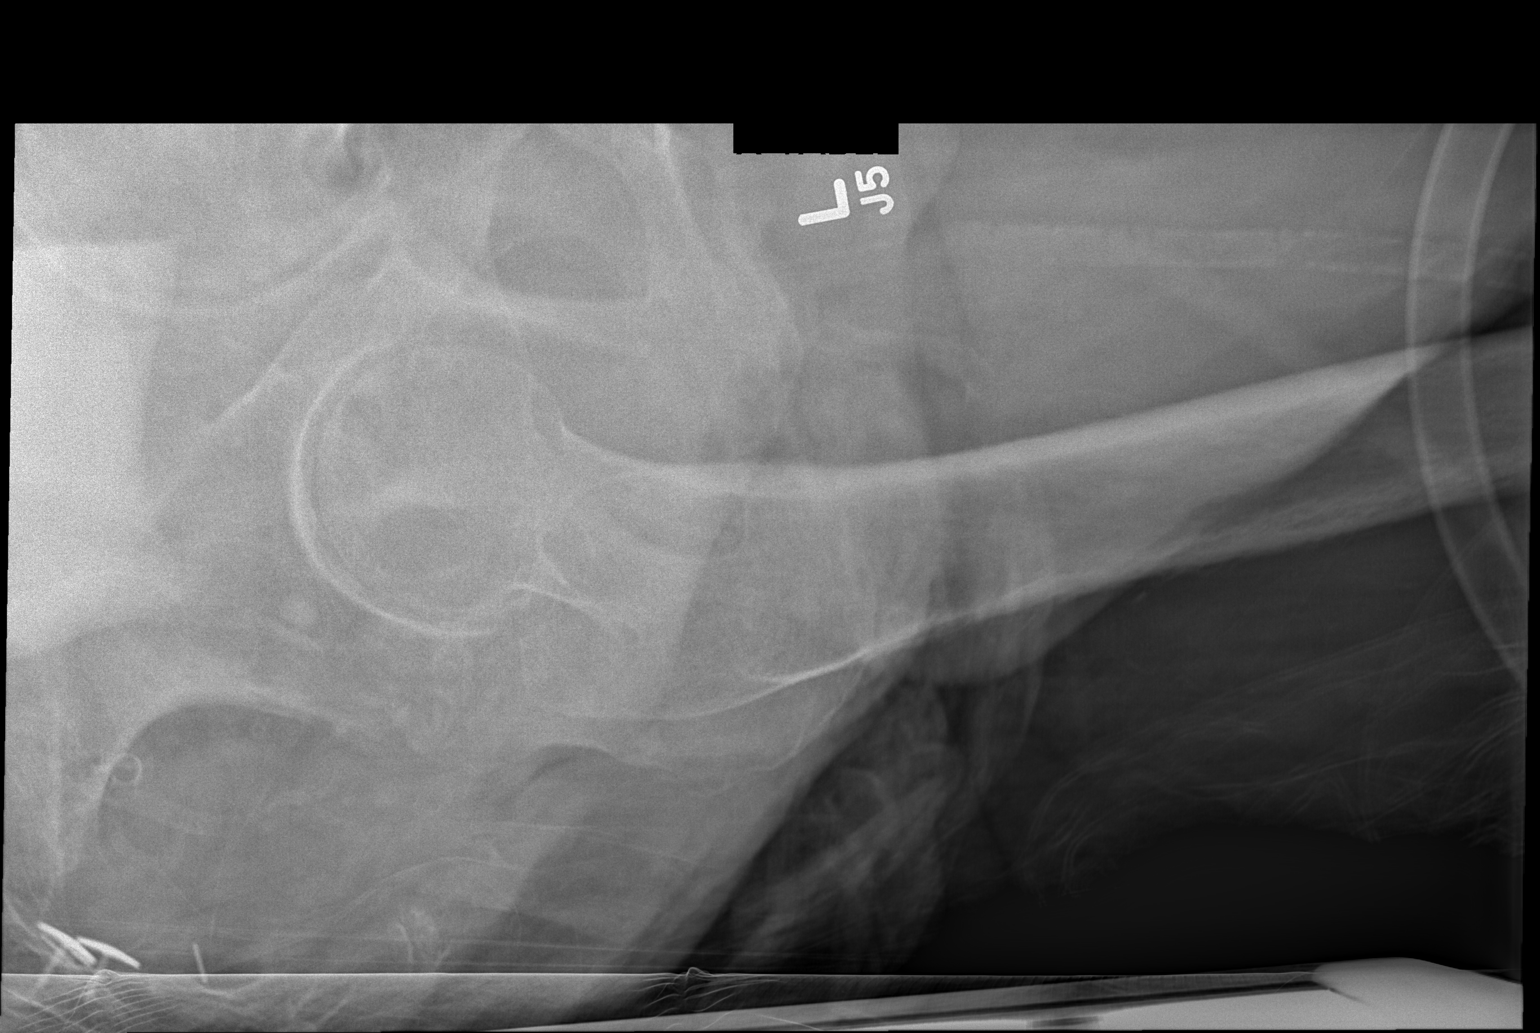

[3 of 3 positions shown; findings below may reference images not displayed]

FINDINGS: Mild symmetric degenerative changes in the hips bilaterally with
joint space narrowing and spurring. Mild diffuse osteopenia.
Fractures are noted through the left superior and inferior pubic
rami. No femoral neck fracture. No subluxation or dislocation. SI
joints are symmetric.
IMPRESSION: Nondisplaced left superior and inferior pubic rami fractures.

## 2014-12-24 ENCOUNTER — Ambulatory Visit: Payer: Self-pay | Admitting: Endocrinology

## 2014-12-31 ENCOUNTER — Ambulatory Visit: Payer: Self-pay | Admitting: Gynecology

## 2015-01-01 ENCOUNTER — Ambulatory Visit: Payer: Self-pay | Admitting: Cardiology

## 2015-01-04 ENCOUNTER — Telehealth: Payer: Self-pay | Admitting: Cardiology

## 2015-01-04 NOTE — Telephone Encounter (Signed)
Let Colletta Maryland with [facility] know that message will be sent to Dr. Marlou Porch nurse. Will forward to [employer].

## 2015-01-04 NOTE — Telephone Encounter (Signed)
New EMssage  [name] is following up on documentation that was sent 11/18 and 12/30 that was intended for Dr. Marlou Porch to sign. Please call back.

## 2015-01-05 ENCOUNTER — Encounter: Payer: Self-pay | Admitting: Cardiology

## 2015-01-05 NOTE — Telephone Encounter (Signed)
Spoke with Colletta Maryland - she states she has 3 outstanding orders that need to be signed.  Advised the last one we have was scanned in 11/17 for an order on 11/12.  She states she doesn't have that one and also has 2 others that need to be signed.  She will refax all of them.  Will fax back once signed by MD.

## 2015-01-08 ENCOUNTER — Telehealth: Payer: Self-pay | Admitting: Cardiology

## 2015-01-08 NOTE — Telephone Encounter (Signed)
Aware we did receive the fax and it is on Dr Marlou Porch' cart to sign.  Will fax back ASAP.

## 2015-01-08 NOTE — Telephone Encounter (Signed)
New message      Did we get the fax for Dr Marlou Porch to sign PT/INR orders?

## 2015-01-19 ENCOUNTER — Telehealth: Payer: Self-pay | Admitting: Cardiology

## 2015-01-25 DEATH — deceased

## 2015-01-27 NOTE — Telephone Encounter (Signed)
Left message for [name] that I refaxed orders and I have kept them.  Requested she call back if she doesn't receive them again.

## 2015-01-27 NOTE — Telephone Encounter (Signed)
New Message   Bethany from [facility] trying to find documents that were faxed multiple times for Dr. Marlou Porch to sign. She states they are orders for home health services provided in the home... Please call back to discuss

## 2015-03-16 ENCOUNTER — Telehealth: Payer: Self-pay | Admitting: *Deleted

## 2015-03-16 NOTE — Telephone Encounter (Signed)
Patient is deceased.
